# Patient Record
Sex: Male | Born: 1945
Health system: Southern US, Community
[De-identification: ages and names within clinical notes are randomized; demographics above are authoritative.]

## PROBLEM LIST (undated history)

## (undated) DIAGNOSIS — J45909 Unspecified asthma, uncomplicated: Secondary | ICD-10-CM

## (undated) DIAGNOSIS — F32A Depression, unspecified: Secondary | ICD-10-CM

## (undated) DIAGNOSIS — F329 Major depressive disorder, single episode, unspecified: Secondary | ICD-10-CM

## (undated) DIAGNOSIS — Z9981 Dependence on supplemental oxygen: Secondary | ICD-10-CM

## (undated) DIAGNOSIS — R0602 Shortness of breath: Secondary | ICD-10-CM

## (undated) DIAGNOSIS — K469 Unspecified abdominal hernia without obstruction or gangrene: Secondary | ICD-10-CM

## (undated) DIAGNOSIS — I38 Endocarditis, valve unspecified: Secondary | ICD-10-CM

## (undated) DIAGNOSIS — R0600 Dyspnea, unspecified: Secondary | ICD-10-CM

## (undated) DIAGNOSIS — R011 Cardiac murmur, unspecified: Secondary | ICD-10-CM

## (undated) DIAGNOSIS — J690 Pneumonitis due to inhalation of food and vomit: Secondary | ICD-10-CM

## (undated) DIAGNOSIS — I10 Essential (primary) hypertension: Secondary | ICD-10-CM

## (undated) DIAGNOSIS — C801 Malignant (primary) neoplasm, unspecified: Secondary | ICD-10-CM

## (undated) DIAGNOSIS — K219 Gastro-esophageal reflux disease without esophagitis: Secondary | ICD-10-CM

## (undated) DIAGNOSIS — J449 Chronic obstructive pulmonary disease, unspecified: Secondary | ICD-10-CM

## (undated) DIAGNOSIS — M1990 Unspecified osteoarthritis, unspecified site: Secondary | ICD-10-CM

## (undated) DIAGNOSIS — C61 Malignant neoplasm of prostate: Secondary | ICD-10-CM

## (undated) HISTORY — PX: PROSTATE BIOPSY: SHX241

## (undated) HISTORY — PX: HERNIA REPAIR: SHX51

---

## 2002-01-18 ENCOUNTER — Encounter: Payer: Self-pay | Admitting: Emergency Medicine

## 2002-01-18 ENCOUNTER — Emergency Department (HOSPITAL_COMMUNITY): Admission: EM | Admit: 2002-01-18 | Discharge: 2002-01-18 | Payer: Self-pay | Admitting: Emergency Medicine

## 2002-05-24 ENCOUNTER — Encounter: Payer: Self-pay | Admitting: Emergency Medicine

## 2002-05-24 ENCOUNTER — Emergency Department (HOSPITAL_COMMUNITY): Admission: EM | Admit: 2002-05-24 | Discharge: 2002-05-24 | Payer: Self-pay | Admitting: Emergency Medicine

## 2003-02-27 ENCOUNTER — Emergency Department (HOSPITAL_COMMUNITY): Admission: EM | Admit: 2003-02-27 | Discharge: 2003-02-28 | Payer: Self-pay | Admitting: *Deleted

## 2003-05-18 ENCOUNTER — Emergency Department (HOSPITAL_COMMUNITY): Admission: EM | Admit: 2003-05-18 | Discharge: 2003-05-18 | Payer: Self-pay | Admitting: *Deleted

## 2003-05-18 ENCOUNTER — Encounter: Payer: Self-pay | Admitting: *Deleted

## 2003-05-27 ENCOUNTER — Ambulatory Visit (HOSPITAL_COMMUNITY): Admission: RE | Admit: 2003-05-27 | Discharge: 2003-05-27 | Payer: Self-pay | Admitting: Internal Medicine

## 2003-05-27 ENCOUNTER — Encounter: Payer: Self-pay | Admitting: Internal Medicine

## 2003-10-12 ENCOUNTER — Emergency Department (HOSPITAL_COMMUNITY): Admission: EM | Admit: 2003-10-12 | Discharge: 2003-10-12 | Payer: Self-pay | Admitting: Emergency Medicine

## 2003-11-20 ENCOUNTER — Emergency Department (HOSPITAL_COMMUNITY): Admission: EM | Admit: 2003-11-20 | Discharge: 2003-11-20 | Payer: Self-pay | Admitting: Emergency Medicine

## 2004-04-04 ENCOUNTER — Emergency Department (HOSPITAL_COMMUNITY): Admission: EM | Admit: 2004-04-04 | Discharge: 2004-04-04 | Payer: Self-pay | Admitting: Emergency Medicine

## 2004-10-07 ENCOUNTER — Inpatient Hospital Stay (HOSPITAL_COMMUNITY): Admission: EM | Admit: 2004-10-07 | Discharge: 2004-10-10 | Payer: Self-pay | Admitting: Emergency Medicine

## 2005-10-03 ENCOUNTER — Emergency Department (HOSPITAL_COMMUNITY): Admission: EM | Admit: 2005-10-03 | Discharge: 2005-10-03 | Payer: Self-pay | Admitting: Emergency Medicine

## 2009-02-10 ENCOUNTER — Ambulatory Visit (HOSPITAL_COMMUNITY): Admission: RE | Admit: 2009-02-10 | Discharge: 2009-02-10 | Payer: Self-pay | Admitting: Urology

## 2009-05-12 ENCOUNTER — Ambulatory Visit (HOSPITAL_COMMUNITY): Admission: RE | Admit: 2009-05-12 | Discharge: 2009-05-12 | Payer: Self-pay | Admitting: Urology

## 2009-08-18 ENCOUNTER — Encounter (HOSPITAL_COMMUNITY): Admission: RE | Admit: 2009-08-18 | Discharge: 2009-09-17 | Payer: Self-pay | Admitting: Urology

## 2009-10-18 HISTORY — PX: OTHER SURGICAL HISTORY: SHX169

## 2011-01-24 LAB — CBC
HCT: 48.4 % (ref 39.0–52.0)
MCHC: 34.5 g/dL (ref 30.0–36.0)
MCV: 91.2 fL (ref 78.0–100.0)
RBC: 5.31 MIL/uL (ref 4.22–5.81)

## 2011-01-24 LAB — BASIC METABOLIC PANEL
BUN: 10 mg/dL (ref 6–23)
CO2: 28 mEq/L (ref 19–32)
Chloride: 101 mEq/L (ref 96–112)
Creatinine, Ser: 0.8 mg/dL (ref 0.4–1.5)
GFR calc Af Amer: >60 mL/min (ref >60)
Potassium: 4.3 mEq/L (ref 3.5–5.1)

## 2011-03-02 NOTE — H&P (Signed)
NAME:  Michael Ellison, Michael Ellison              ACCOUNT NO.:  [id number]   MEDICAL RECORD NO.:  [id number]          PATIENT TYPE:  AMB   LOCATION:  DAY                           FACILITY:  [facility]   PHYSICIAN:  Dennie Maizes, M.D.   DATE OF BIRTH:  [birth year]   DATE OF ADMISSION:  DATE OF DISCHARGE:  LH                              HISTORY & PHYSICAL   CHIEF COMPLAINT:  Microhematuria, urethral stricture, elevated PSA.   HISTORY OF PRESENT ILLNESS:  This 65 year old male was referred to me by  Dr. Felecia Shelling for evaluation of elevated PSA of 10.7 nanograms per mL.  He  has been treated with a course of antibiotics.  His PSA is elevated at  11.5 nanograms/mL.  He is waiting for further evaluation and transrectal  ultrasound and ultrasound-guided biopsy of the prostate.   The patient has been noted to have persistent microhematuria.  Urine  culture and sensitivity revealed no growth.  Urine cytology revealed no  evidence of malignant or dysplastic cells.  CT scan of the abdomen and  pelvis revealed bilateral renal cysts.  There is no evidence of any  renal mass, hydronephrosis, or calculus.  Cystoscopy in the office  revealed a urethral stricture.  The patient is brought to the short-stay  center today for cystoscopy and possible visual internal urethrotomy.   The patient denied having amylase voiding difficulty at present.  He has  urinary frequency times 5 to 6 and nocturia times zero.  No history of  dysuria, gross hematuria, low back pain.   PAST MEDICAL HISTORY:  Negative for urinary tract infections,  urolithiasis, or prostate problems.  History of hypertension, COPD.   MEDICATIONS:  Diovan 160/12.5 1 p.o. daily, albuterol inhaler.   ALLERGIES:  None.   PAST SURGICAL HISTORY:  None.   FAMILY HISTORY:  Negative for urolithiasis.  There is no family history  of prostate cancer.   REVIEW OF SYSTEMS:  Negative.   PHYSICAL EXAMINATION:  VITAL SIGNS:  Height 6 feet 3 inches, weight 182  pounds.  HEENT:  Normal.  LUNGS:  Clear to auscultation.  HEART:  Regular rate and rhythm.  No murmur.  ABDOMEN:  Soft.  No palpable flank mass or costovertebral angle  tenderness.  Bladder is not palpable.  GU:  Penis and testes are normal.  RECTAL:  A 45 g benign prostate.   IMPRESSION:  1. Microhematuria.  2. Elevated PSA.  3. Urethral stricture.   PLAN:  Cystoscopy and visual internal urethrotomy in short-stay center.  I have discussed with the patient and his family regarding diagnosis,  operative details,  outcome, possible risks and complications and they  agreed for the procedure to be done.      Dennie Maizes, M.D.  Electronically Signed     SK/MEDQ  D:  05/11/2009  T:  05/11/2009  Job:  [id number]   cc:   Tesfaye D. Felecia Shelling, MD  Fax: [phone]   [facility]

## 2011-03-02 NOTE — Op Note (Signed)
NAME:  Michael Ellison, Michael Ellison              ACCOUNT NO.:  [id number]   MEDICAL RECORD NO.:  [id number]          PATIENT TYPE:  AMB   LOCATION:  DAY                           FACILITY:  [facility]   PHYSICIAN:  Dennie Maizes, M.D.   DATE OF BIRTH:  1946/04/06   DATE OF PROCEDURE:  05/12/2009  DATE OF DISCHARGE:                               OPERATIVE REPORT   PREOPERATIVE DIAGNOSES:  Voiding difficulty, microhematuria, urethral  stricture.   POSTOPERATIVE DIAGNOSES:  Voiding difficulty, microhematuria, urethral  stricture.   OPERATIVE PROCEDURE:  Cystoscopy under visual internal urethrotomy.   ANESTHESIA:  General.   SURGEON:  Dennie Maizes, MD   COMPLICATIONS:  None.   INDICATIONS FOR THE PROCEDURE:  This 65 year old male was evaluated in  the office for microhematuria.  X-rays were negative.  Cystoscopy  revealed a bulbous urethral stricture.  The patient was brought to the  operating room today for cystoscopy and visual internal urethrotomy.   DESCRIPTION OF PROCEDURE:  General anesthesia was induced, and the  patient was placed on the OR table in the dorsal lithotomy position.  The lower abdomen and genitalia were prepped and draped in a sterile  fashion.  Cystoscopy was done with a 17-French scope.  There was a  bulbous urethral stricture, and the scope could not be introduced into  the bladder.  The cystoscope was removed.   A 20-French visual urethrotome was then inserted into the urethra.  Bulbous urethral stricture was incised at 12 o'clock position with a  cold knife and opened up.  The stricture was about 2 cm in length.  There was no active bleeding at this time.  The bladder was then  examined and found to be normal.  There was no evidence of any foreign  body, tumor, or carcinoma in situ inside the bladder.  There was  moderate prostate hypertrophy with partial obstruction of the bladder  neck area.  The urethral stricture was re-examined while withdrawing the  endoscope.   The  stricture was found to be adequately opened up.  An 18-French Foley  catheter was inserted into the bladder and connected to a bag.  The  patient remained stable throughout the procedure.  He was transferred to  the PACU in a satisfactory condition.      Dennie Maizes, M.D.  Electronically Signed     SK/MEDQ  D:  05/12/2009  T:  05/12/2009  Job:  [id number]   cc:   Tesfaye D. Felecia Shelling, MD  Fax: [phone]

## 2011-03-05 NOTE — Consult Note (Signed)
NAME:  Michael Ellison, Michael Ellison              ACCOUNT NO.:  [id number]   MEDICAL RECORD NO.:  [id number]          PATIENT TYPE:  INP   LOCATION:  A311                          FACILITY:  [facility]   PHYSICIAN:  Edward L. Juanetta Gosling, M.D.DATE OF BIRTH:  July 01, 1946   DATE OF CONSULTATION:  DATE OF DISCHARGE:                                   CONSULTATION   HISTORY OF PRESENT ILLNESS:  Michael Ellison is a 65 year old who has chronic  obstructive pulmonary disease and consultation is for chronic obstructive  pulmonary disease.  He has been having shortness of breath for about two  weeks.  It has gotten much worse in the last three days.  He has been  coughing, not able to cough much of anything up.  He has become markedly  short of breath.  He came to the emergency room.  He was hypoxic.  He was  given steroids and nebulizer treatments, but he continued to have shortness  of breath.  He is admitted to the hospital for that.   PAST MEDICAL HISTORY:  His past medical history is positive for chronic  obstructive pulmonary disease, hypertension. He is out of the hospital.   MEDICATIONS:  He was on Diovan HCT 80/12.5, one daily, albuterol and  Atrovent nebulizer treatments.   SOCIAL HISTORY:  He has about a 60 pack-a-year smoking history and continues  to smoke.  He does not use any alcohol.   FAMILY HISTORY:  Positive for lung disease he says in two or three other  members.   PHYSICAL EXAMINATION:  GENERAL:  He appears to be fairly comfortable.  VITAL SIGNS:  Temperature 97.5, pulse 106, respirations 20.  Blood sugar was  220.  He has not been known to be a diabetic previously.  Blood pressure  144/103, O2 saturations 95%, now on two liters.  CHEST:  Fairly marked bilateral rhonchi and wheezes.  He has a marked cough.  EXTREMITIES:  Showed no edema.  CNS:  Grossly intact.  CARDIAC:  Exam is largely obscured by airway noise.   LABORATORY DATA:  Blood gas shows a pO2 of 56, pCO2 of 36, pH 7.45.  White  count 22,600, hemoglobin 16.1.  He had 95% neutrophils.  Blood sugar was 157  on admission.  Potassium 3.1.  Chest x-ray showed chronic obstructive  pulmonary disease, no pneumonia.   ASSESSMENT:  He has chronic obstructive pulmonary disease exacerbation.  He  may be diabetic now. He has been hypokalemic which is being replaced.  At  this point, I agree with his current treatments which are albuterol and  Atrovent nebulizer treatments, Solu-Medrol, Avapro which is substitute for  his Diovan, Rocephin and Ativan on a p.r.n. basis.  I would go ahead and add  Advair and continue all of the other treatments and follow.   Thank you for allowing me to see him with you.     Edwa   ELH/MEDQ  D:  10/08/2004  T:  10/08/2004  Job:  [id number]

## 2011-03-05 NOTE — H&P (Signed)
NAME:  Michael Ellison, Michael Ellison              ACCOUNT NO.:  [id number]   MEDICAL RECORD NO.:  [id number]          PATIENT TYPE:  INP   LOCATION:  A311                          FACILITY:  [facility]   PHYSICIAN:  Tesfaye D. Felecia Shelling, MD   DATE OF BIRTH:  1946/08/19   DATE OF ADMISSION:  10/07/2004  DATE OF DISCHARGE:  LH                                HISTORY & PHYSICAL   CHIEF COMPLAINT:  Cough and shortness of breath.   HISTORY OF PRESENT ILLNESS:  This is a 65 year old male patient with history  of hypertension and chronic obstructive pulmonary disease who came to the  emergency room with above complaint.  The patient had symptoms of shortness  of breath and a cough for the last two weeks.  He was using his nebulizer  treatment.  This morning the patient started having severe shortness of  breath  and coughing.  He could not breathe comfortably.  He was not able to  take anything orally.  He was brought to the emergency room where he was  evaluated.  The patient was found to be significantly hypoxic.  He was given  several nebulizer treatments and oral steroids.  However, his symptoms  continued to persist.  The patient was then admitted and started on IV  steroids and nebulizer treatment.   REVIEW OF SYSTEMS:  The patient has generalized weakness.  He has difficulty  in taking oral feeding.  The patient feels weak and tired.  No history of  fever, chills, chest pain, nausea, vomiting, abdominal  pain, dysuria, or  frequency or urgency of urination.   PAST MEDICAL HISTORY:  1.  Chronic obstructive pulmonary disease.  2.  Hypertension.  3.  Nicotine addiction.   CURRENT MEDICATIONS:  1.  Darvon HCT 80/0.5 one tablet p.o. daily.  2.  Atrovent and Proventil nebulizer q.4h.   SOCIAL HISTORY:  The patient is currently disabled due to his illness.  The  patient has history of longstanding tobacco abuse.  No history of alcohol or  substance abuse.   PHYSICAL EXAMINATION:  GENERAL:  The patient is  alert, awake, acutely sick  looking.  VITAL SIGNS:  Blood pressure 129/78, pulse 132, respiratory rate 28,  temperature 97 degrees F.  HEENT:  Pupils equal and reactive.  NECK:  Supple.  CHEST:  Poor air entry, bilateral expiratory wheezes and rhonchi.  CARDIOVASCULAR;  First and second heart sounds heard.  Tachycardic but  regular.  ABDOMEN:  Soft.  Bowel sounds positive.  No organomegaly.  EXTREMITIES:  No leg edema.   LABORATORY DATA AND OTHER STUDIES:  ABG on room air, pH 7.4, PCO2 28.1, PO2  50.0, bicarb 17, saturation 90.7.  CBC:  WBC 22.6, hemoglobin 16.1,  hematocrit 47.1, and platelets 304.  Sodium 133, potassium 3.1, chloride  100, carbon dioxide 22, glucose 157, BUN 9, creatinine 0.9, and calcium 9.2.   IMPRESSION:  1.  Acute exacerbation of chronic obstructive pulmonary disease with      hypoxemia.  2.  Tachycardia probably secondary to above.  3.  Mild hypokalemia.  4.  Hypertension.  5.  Nicotine addiction.   PLAN:  1.  Will continue patient on Atrovent and Xopenex nebulizer.  2.  Will continue IV antibiotics.  3.  Continue IV steroids.  4.  Will supplement his potassium.  5.  Will do pulmonary consult.  6.  Will repeat ABGs.  7.  Will start patient on IV fluids.     Tesf   TDF/MEDQ  D:  10/07/2004  T:  10/07/2004  Job:  [id number]

## 2011-03-05 NOTE — Discharge Summary (Signed)
NAME:  Michael Ellison, Michael Ellison              ACCOUNT NO.:  [id number]   MEDICAL RECORD NO.:  [id number]          PATIENT TYPE:  INP   LOCATION:  A311                          FACILITY:  [facility]   PHYSICIAN:  Tesfaye D. Felecia Shelling, MD   DATE OF BIRTH:  Nov 12, 1945   DATE OF ADMISSION:  DATE OF DISCHARGE:  12/24/2005LH                                 DISCHARGE SUMMARY   DISCHARGE DIAGNOSES:  1.  Acute exacerbation of chronic obstructive pulmonary disease.  2.  Hypoxemia secondary to the above.  3.  Hypertension.   DISCHARGE MEDICATIONS:  1.  Atrovent and Proventil nebulizer, q.4h. while awake.  2.  Prednisolone, 40 mg with tapering dose.  3.  Cefzil 500 mg p.o. b.i.d. for five more days.  4.  Advair 250/ 500, one puff b.i.d.  5.  Diovan HCT 80/12.5, one tablet p.o. daily.   DISPOSITION:  The patient will be discharged home in stable condition.   HOSPITAL COURSE:  This is a 65 year old male patient with a known case of  hypertension and acute obstructive pulmonary disease who came due to cough  and shortness of breath.  The patient was found to be in acute exacerbation  of chronic obstructive pulmonary disease.  He was admitted and started on IV  steroid, IV antibiotics and nebulizer treatment.  The patient also given  oxygen.  Over the hospital stay, he gradually improved.  A pulmonary consult  was also done and assisted in his management.  The patient currently is  improved.  He will be discharged to home on oral steroid and nebulizer  treatments.     Tesf   TDF/MEDQ  D:  10/10/2004  T:  10/10/2004  Job:  [id number]

## 2011-03-29 ENCOUNTER — Emergency Department (HOSPITAL_COMMUNITY)
Admission: EM | Admit: 2011-03-29 | Discharge: 2011-03-29 | Disposition: A | Discharge status: Home / Self Care | Payer: Medicare Other | Attending: Emergency Medicine | Admitting: Emergency Medicine

## 2011-03-29 ENCOUNTER — Emergency Department (HOSPITAL_COMMUNITY): Payer: Medicare Other

## 2011-03-29 DIAGNOSIS — Z8546 Personal history of malignant neoplasm of prostate: Secondary | ICD-10-CM | POA: Insufficient documentation

## 2011-03-29 DIAGNOSIS — I10 Essential (primary) hypertension: Secondary | ICD-10-CM | POA: Insufficient documentation

## 2011-03-29 DIAGNOSIS — W2203XA Walked into furniture, initial encounter: Secondary | ICD-10-CM | POA: Insufficient documentation

## 2011-03-29 DIAGNOSIS — S92309A Fracture of unspecified metatarsal bone(s), unspecified foot, initial encounter for closed fracture: Secondary | ICD-10-CM | POA: Insufficient documentation

## 2011-03-29 DIAGNOSIS — H9319 Tinnitus, unspecified ear: Secondary | ICD-10-CM | POA: Insufficient documentation

## 2011-03-29 DIAGNOSIS — F17200 Nicotine dependence, unspecified, uncomplicated: Secondary | ICD-10-CM | POA: Insufficient documentation

## 2011-10-19 HISTORY — PX: ZENKER'S DIVERTICULECTOMY: SHX6190

## 2012-02-24 ENCOUNTER — Inpatient Hospital Stay (HOSPITAL_COMMUNITY)
Admission: EM | Admit: 2012-02-24 | Discharge: 2012-04-25 | DRG: 003 | Disposition: A | Discharge status: Skilled Nursing Facility | Payer: Medicare Other | Source: Ambulatory Visit | Attending: Pulmonary Disease | Admitting: Pulmonary Disease

## 2012-02-24 ENCOUNTER — Emergency Department (HOSPITAL_COMMUNITY): Payer: Medicare Other

## 2012-02-24 ENCOUNTER — Encounter (HOSPITAL_COMMUNITY): Payer: Self-pay | Admitting: Emergency Medicine

## 2012-02-24 DIAGNOSIS — K560 Paralytic ileus: Secondary | ICD-10-CM | POA: Diagnosis not present

## 2012-02-24 DIAGNOSIS — R1310 Dysphagia, unspecified: Secondary | ICD-10-CM | POA: Diagnosis not present

## 2012-02-24 DIAGNOSIS — R6520 Severe sepsis without septic shock: Secondary | ICD-10-CM | POA: Diagnosis not present

## 2012-02-24 DIAGNOSIS — J80 Acute respiratory distress syndrome: Secondary | ICD-10-CM

## 2012-02-24 DIAGNOSIS — R7309 Other abnormal glucose: Secondary | ICD-10-CM | POA: Diagnosis not present

## 2012-02-24 DIAGNOSIS — Y849 Medical procedure, unspecified as the cause of abnormal reaction of the patient, or of later complication, without mention of misadventure at the time of the procedure: Secondary | ICD-10-CM | POA: Diagnosis present

## 2012-02-24 DIAGNOSIS — J9600 Acute respiratory failure, unspecified whether with hypoxia or hypercapnia: Secondary | ICD-10-CM | POA: Diagnosis present

## 2012-02-24 DIAGNOSIS — D649 Anemia, unspecified: Secondary | ICD-10-CM | POA: Diagnosis not present

## 2012-02-24 DIAGNOSIS — R5381 Other malaise: Secondary | ICD-10-CM | POA: Diagnosis not present

## 2012-02-24 DIAGNOSIS — Z930 Tracheostomy status: Secondary | ICD-10-CM

## 2012-02-24 DIAGNOSIS — F0390 Unspecified dementia without behavioral disturbance: Secondary | ICD-10-CM | POA: Diagnosis present

## 2012-02-24 DIAGNOSIS — R404 Transient alteration of awareness: Secondary | ICD-10-CM | POA: Diagnosis present

## 2012-02-24 DIAGNOSIS — R109 Unspecified abdominal pain: Secondary | ICD-10-CM | POA: Diagnosis present

## 2012-02-24 DIAGNOSIS — K9189 Other postprocedural complications and disorders of digestive system: Secondary | ICD-10-CM | POA: Diagnosis present

## 2012-02-24 DIAGNOSIS — I498 Other specified cardiac arrhythmias: Secondary | ICD-10-CM | POA: Diagnosis present

## 2012-02-24 DIAGNOSIS — G9340 Encephalopathy, unspecified: Secondary | ICD-10-CM

## 2012-02-24 DIAGNOSIS — R578 Other shock: Secondary | ICD-10-CM | POA: Diagnosis not present

## 2012-02-24 DIAGNOSIS — K56609 Unspecified intestinal obstruction, unspecified as to partial versus complete obstruction: Secondary | ICD-10-CM

## 2012-02-24 DIAGNOSIS — E870 Hyperosmolality and hypernatremia: Secondary | ICD-10-CM | POA: Diagnosis not present

## 2012-02-24 DIAGNOSIS — R6521 Severe sepsis with septic shock: Secondary | ICD-10-CM

## 2012-02-24 DIAGNOSIS — F1010 Alcohol abuse, uncomplicated: Secondary | ICD-10-CM

## 2012-02-24 DIAGNOSIS — E876 Hypokalemia: Secondary | ICD-10-CM | POA: Diagnosis not present

## 2012-02-24 DIAGNOSIS — A419 Sepsis, unspecified organism: Secondary | ICD-10-CM | POA: Diagnosis not present

## 2012-02-24 DIAGNOSIS — T424X5A Adverse effect of benzodiazepines, initial encounter: Secondary | ICD-10-CM | POA: Diagnosis not present

## 2012-02-24 DIAGNOSIS — T8130XA Disruption of wound, unspecified, initial encounter: Secondary | ICD-10-CM | POA: Diagnosis not present

## 2012-02-24 DIAGNOSIS — N179 Acute kidney failure, unspecified: Secondary | ICD-10-CM | POA: Diagnosis present

## 2012-02-24 DIAGNOSIS — N390 Urinary tract infection, site not specified: Secondary | ICD-10-CM | POA: Diagnosis not present

## 2012-02-24 DIAGNOSIS — I10 Essential (primary) hypertension: Secondary | ICD-10-CM | POA: Diagnosis present

## 2012-02-24 DIAGNOSIS — K225 Diverticulum of esophagus, acquired: Secondary | ICD-10-CM | POA: Diagnosis present

## 2012-02-24 DIAGNOSIS — J441 Chronic obstructive pulmonary disease with (acute) exacerbation: Secondary | ICD-10-CM | POA: Diagnosis present

## 2012-02-24 DIAGNOSIS — R451 Restlessness and agitation: Secondary | ICD-10-CM

## 2012-02-24 DIAGNOSIS — Z8546 Personal history of malignant neoplasm of prostate: Secondary | ICD-10-CM

## 2012-02-24 DIAGNOSIS — E46 Unspecified protein-calorie malnutrition: Secondary | ICD-10-CM | POA: Diagnosis not present

## 2012-02-24 DIAGNOSIS — K565 Intestinal adhesions [bands], unspecified as to partial versus complete obstruction: Principal | ICD-10-CM | POA: Diagnosis present

## 2012-02-24 DIAGNOSIS — J954 Chemical pneumonitis due to anesthesia: Secondary | ICD-10-CM | POA: Diagnosis not present

## 2012-02-24 DIAGNOSIS — F341 Dysthymic disorder: Secondary | ICD-10-CM | POA: Diagnosis present

## 2012-02-24 DIAGNOSIS — R197 Diarrhea, unspecified: Secondary | ICD-10-CM | POA: Diagnosis not present

## 2012-02-24 DIAGNOSIS — J690 Pneumonitis due to inhalation of food and vomit: Secondary | ICD-10-CM

## 2012-02-24 DIAGNOSIS — R188 Other ascites: Secondary | ICD-10-CM | POA: Diagnosis not present

## 2012-02-24 DIAGNOSIS — Y9210 Unspecified residential institution as the place of occurrence of the external cause: Secondary | ICD-10-CM | POA: Clinically undetermined

## 2012-02-24 DIAGNOSIS — G9341 Metabolic encephalopathy: Secondary | ICD-10-CM | POA: Diagnosis not present

## 2012-02-24 DIAGNOSIS — R000 Tachycardia, unspecified: Secondary | ICD-10-CM

## 2012-02-24 DIAGNOSIS — J95821 Acute postprocedural respiratory failure: Secondary | ICD-10-CM

## 2012-02-24 DIAGNOSIS — K567 Ileus, unspecified: Secondary | ICD-10-CM

## 2012-02-24 HISTORY — DX: Essential (primary) hypertension: I10

## 2012-02-24 HISTORY — DX: Malignant neoplasm of prostate: C61

## 2012-02-24 HISTORY — DX: Malignant (primary) neoplasm, unspecified: C80.1

## 2012-02-24 LAB — COMPREHENSIVE METABOLIC PANEL
ALT: 22 U/L (ref 0–53)
AST: 24 U/L (ref 0–37)
Albumin: 4.3 g/dL (ref 3.5–5.2)
CO2: 28 mEq/L (ref 19–32)
Calcium: 10.7 mg/dL — ABNORMAL HIGH (ref 8.4–10.5)
Creatinine, Ser: 0.85 mg/dL (ref 0.50–1.35)
Sodium: 139 mEq/L (ref 135–145)
Total Protein: 8.7 g/dL — ABNORMAL HIGH (ref 6.0–8.3)

## 2012-02-24 LAB — DIFFERENTIAL
Basophils Absolute: 0 10*3/uL (ref 0.0–0.1)
Basophils Relative: 0 % (ref 0–1)
Eosinophils Absolute: 0 10*3/uL (ref 0.0–0.7)
Eosinophils Relative: 0 % (ref 0–5)
Lymphocytes Relative: 4 % — ABNORMAL LOW (ref 12–46)
Monocytes Absolute: 0.7 10*3/uL (ref 0.1–1.0)

## 2012-02-24 LAB — URINALYSIS, ROUTINE W REFLEX MICROSCOPIC
Glucose, UA: NEGATIVE mg/dL
Leukocytes, UA: NEGATIVE
Protein, ur: 100 mg/dL — AB
Specific Gravity, Urine: 1.025 (ref 1.005–1.030)

## 2012-02-24 LAB — CBC
HCT: 50.6 % (ref 39.0–52.0)
MCHC: 34.8 g/dL (ref 30.0–36.0)
MCV: 89.4 fL (ref 78.0–100.0)
Platelets: 258 10*3/uL (ref 150–400)
RDW: 14.2 % (ref 11.5–15.5)
WBC: 20.1 10*3/uL — ABNORMAL HIGH (ref 4.0–10.5)

## 2012-02-24 LAB — URINE MICROSCOPIC-ADD ON

## 2012-02-24 MED ORDER — IOHEXOL 300 MG/ML  SOLN
100.0000 mL | Freq: Once | INTRAMUSCULAR | Status: AC | PRN
  Administered 2012-02-24: 100 mL via INTRAVENOUS

## 2012-02-24 MED ORDER — ONDANSETRON HCL 4 MG/2ML IJ SOLN
4.0000 mg | Freq: Four times a day (QID) | INTRAMUSCULAR | Status: DC | PRN
  Administered 2012-02-24 – 2012-02-29 (×5): 4 mg via INTRAVENOUS
  Filled 2012-02-24 (×5): qty 2

## 2012-02-24 MED ORDER — HYDROMORPHONE HCL PF 1 MG/ML IJ SOLN: 1.0000 mg | INTRAMUSCULAR | Status: DC | PRN

## 2012-02-24 MED ORDER — SODIUM CHLORIDE 0.9 % IV SOLN
Freq: Once | INTRAVENOUS | Status: AC
  Administered 2012-02-24: 09:00:00 via INTRAVENOUS

## 2012-02-24 MED ORDER — IOHEXOL 300 MG/ML  SOLN: 40.0000 mL | Freq: Once | INTRAMUSCULAR | Status: AC | PRN

## 2012-02-24 MED ORDER — HYDROMORPHONE HCL PF 1 MG/ML IJ SOLN
1.0000 mg | INTRAMUSCULAR | Status: DC | PRN
  Administered 2012-02-24: 1 mg via INTRAVENOUS
  Administered 2012-02-24 – 2012-02-25 (×4): 2 mg via INTRAVENOUS
  Filled 2012-02-24 (×3): qty 2
  Filled 2012-02-24: qty 1
  Filled 2012-02-24: qty 2

## 2012-02-24 MED ORDER — ONDANSETRON HCL 4 MG/2ML IJ SOLN: 4.0000 mg | Freq: Three times a day (TID) | INTRAMUSCULAR | Status: DC | PRN

## 2012-02-24 MED ORDER — ENOXAPARIN SODIUM 40 MG/0.4ML ~~LOC~~ SOLN
40.0000 mg | SUBCUTANEOUS | Status: DC
  Administered 2012-02-24: 40 mg via SUBCUTANEOUS
  Filled 2012-02-24: qty 0.4

## 2012-02-24 MED ORDER — LACTATED RINGERS IV SOLN
INTRAVENOUS | Status: DC
  Administered 2012-02-24 – 2012-02-25 (×2): via INTRAVENOUS

## 2012-02-24 MED ORDER — PANTOPRAZOLE SODIUM 40 MG IV SOLR
40.0000 mg | Freq: Every day | INTRAVENOUS | Status: DC
  Administered 2012-02-24 – 2012-03-24 (×30): 40 mg via INTRAVENOUS
  Filled 2012-02-24 (×34): qty 40

## 2012-02-24 MED ORDER — HYDROMORPHONE HCL PF 2 MG/ML IJ SOLN
2.0000 mg | Freq: Once | INTRAMUSCULAR | Status: AC
  Administered 2012-02-24: 2 mg via INTRAVENOUS
  Filled 2012-02-24: qty 1

## 2012-02-24 MED ORDER — ONDANSETRON HCL 4 MG/2ML IJ SOLN
4.0000 mg | Freq: Once | INTRAMUSCULAR | Status: AC
  Administered 2012-02-24: 4 mg via INTRAVENOUS
  Filled 2012-02-24: qty 2

## 2012-02-24 MED ORDER — PROMETHAZINE HCL 25 MG/ML IJ SOLN
25.0000 mg | Freq: Once | INTRAMUSCULAR | Status: AC
  Administered 2012-02-24: 25 mg via INTRAVENOUS
  Filled 2012-02-24: qty 1

## 2012-02-24 MED ORDER — SODIUM CHLORIDE 0.9 % IV SOLN: INTRAVENOUS | Status: DC

## 2012-02-24 NOTE — ED Provider Notes (Signed)
History   This chart was scribed for Carleene Cooper III, MD by Clarita Crane. The patient was seen in room APA06/APA06. Patient's care was started at 0828.    CSN: [id number]  Arrival date & time 02/24/12  2130   First MD Initiated Contact with Patient 02/24/12 [phone]      Chief Complaint  Patient presents with  . Abdominal Pain  . Emesis    (Consider location/radiation/quality/duration/timing/severity/associated sxs/prior treatment) HPI Michael Ellison is a 66 y.o. male who presents to the Emergency Department complaining of moderate to severe periumbilical abdominal pain rated a 10 out 10 currently with associated nausea, vomiting, tinnitus and diaphoresis onset last night after eating a meal consisting of a hot dog and french fries and persistent since. Also notes experiencing chest tightness after onset of abdominal pain but states tightness was relieved with use of at home breathing treatment.  Patient reports experiencing similar symptoms approximately 1 month ago. Denies diarrhea, fever, chills, SOB, congestion, rash. Patient with h/o HTN, CA, hernia repair, prostatectomy and is a current smoker (1 pack/day).  Past Medical History  Diagnosis Date  . Hypertension   . Cancer     Past Surgical History  Procedure Date  . Hernia repair   . Prostatectomy     No family history on file.  History  Substance Use Topics  . Smoking status: Current Everyday Smoker  . Smokeless tobacco: Not on file  . Alcohol Use: Yes     occasional      Review of Systems A complete 10 system review of systems was obtained and all systems are negative except as noted in the HPI and PMH.   Allergies  Review of patient's allergies indicates no known allergies.  Home Medications  No current outpatient prescriptions on file.  BP 148/98  Pulse 100  Temp 97.7 F (36.5 C)  Resp 20  Ht 6' 2.5" (1.892 m)  Wt 184 lb (83.462 kg)  BMI 23.31 kg/m2  SpO2 98%  Physical Exam  Nursing note and  vitals reviewed. Constitutional: He is oriented to person, place, and time. He appears well-developed and well-nourished. No distress.  HENT:  Head: Normocephalic and atraumatic.  Mouth/Throat: Oropharynx is clear and moist.  Eyes: EOM are normal. Pupils are equal, round, and reactive to light.  Neck: Neck supple. No tracheal deviation present.  Cardiovascular: Normal rate, regular rhythm and normal heart sounds.  Exam reveals no gallop and no friction rub.   No murmur heard. Pulmonary/Chest: Effort normal and breath sounds normal. No respiratory distress. He has no wheezes. He has no rales.  Abdominal: Soft. He exhibits distension. He exhibits no mass. Bowel sounds are decreased. There is tenderness (moderate) in the periumbilical area.       Decreased bowel sounds.  Musculoskeletal: Normal range of motion. He exhibits no edema.  Neurological: He is alert and oriented to person, place, and time. No sensory deficit.  Skin: Skin is warm and dry.  Psychiatric: He has a normal mood and affect. His behavior is normal.    ED Course  Procedures (including critical care time)  DIAGNOSTIC STUDIES: Oxygen Saturation is 98% on room air, normal by my interpretation.    COORDINATION OF CARE: 8:50AM-Patient informed of current plan for treatment and evaluation and agrees with plan at this time.  9:36 AM-Pt vomiting his oral contrast.  Rx Phenergan 25 mg IV. 10:48 AM- Review of lab tests shows CBC with WBC elevated at 20,100 with 93% neutrophils.  Urinalysis is negative.  Glucose is elevated at 184.  Renal function and liver function tests are normal.  Waiting for results of abdominal/pelvic CT. 11:38 AM-CT of abdomen/pelvis shows a small bowel obstruction in the right lower abdomen, felt to be due to adhesions.  Will call Dr. Leticia Penna, general surgeon on call, to see and admit pt.  11:47AM- Carleene Cooper III, MD to patient bedside to explain current lab and imaging results to patient. Patient informed  of intent to admit for further treatment and evaluation.  11:52AM- Consult complete with Dr. Leticia Penna. Patient case explained and discussed.   Results for orders placed during the hospital encounter of 02/24/12  CBC      Component Value Range   WBC 20.1 (*) 4.0 - 10.5 (K/uL)   RBC 5.66  4.22 - 5.81 (MIL/uL)   Hemoglobin 17.6 (*) 13.0 - 17.0 (g/dL)   HCT 16.1  09.6 - 04.5 (%)   MCV 89.4  78.0 - 100.0 (fL)   MCH 31.1  26.0 - 34.0 (pg)   MCHC 34.8  30.0 - 36.0 (g/dL)   RDW 40.9  81.1 - 91.4 (%)   Platelets 258  150 - 400 (K/uL)  DIFFERENTIAL      Component Value Range   Neutrophils Relative 93 (*) 43 - 77 (%)   Neutro Abs 18.7 (*) 1.7 - 7.7 (K/uL)   Lymphocytes Relative 4 (*) 12 - 46 (%)   Lymphs Abs 0.7  0.7 - 4.0 (K/uL)   Monocytes Relative 3  3 - 12 (%)   Monocytes Absolute 0.7  0.1 - 1.0 (K/uL)   Eosinophils Relative 0  0 - 5 (%)   Eosinophils Absolute 0.0  0.0 - 0.7 (K/uL)   Basophils Relative 0  0 - 1 (%)   Basophils Absolute 0.0  0.0 - 0.1 (K/uL)  COMPREHENSIVE METABOLIC PANEL      Component Value Range   Sodium 139  135 - 145 (mEq/L)   Potassium 3.9  3.5 - 5.1 (mEq/L)   Chloride 100  96 - 112 (mEq/L)   CO2 28  19 - 32 (mEq/L)   Glucose, Bld 184 (*) 70 - 99 (mg/dL)   BUN 12  6 - 23 (mg/dL)   Creatinine, Ser 7.82  0.50 - 1.35 (mg/dL)   Calcium 95.6 (*) 8.4 - 10.5 (mg/dL)   Total Protein 8.7 (*) 6.0 - 8.3 (g/dL)   Albumin 4.3  3.5 - 5.2 (g/dL)   AST 24  0 - 37 (U/L)   ALT 22  0 - 53 (U/L)   Alkaline Phosphatase 64  39 - 117 (U/L)   Total Bilirubin 0.9  0.3 - 1.2 (mg/dL)   GFR calc non Af Amer >90  >90 (mL/min)   GFR calc Af Amer >90  >90 (mL/min)  LIPASE, BLOOD      Component Value Range   Lipase 18  11 - 59 (U/L)   Ct Abdomen Pelvis W Contrast  02/24/2012  *RADIOLOGY REPORT*  Clinical Data: Abdominal pain, nausea and vomiting.  History of prostate cancer.  CT ABDOMEN AND PELVIS WITH CONTRAST  Technique:  Multidetector CT imaging of the abdomen and pelvis was  performed following the standard protocol during bolus administration of intravenous contrast.  Contrast: OMNIPAQUE IOHEXOL 300 MG/ML  SOLN CT  Comparison: CT abdomen and pelvis 02/10/2009.  Findings: Bibasilar atelectatic change is noted.  No pleural or pericardial effusion.  Heart size is upper normal.  The patient has a small bowel obstruction.  Small bowel loops are dilated  up to 4.1 cm.  No pneumatosis, free intraperitoneal air or portal venous gas is identified.  Transition point appears to be in the right lower quadrant.  The patient does have a small right inguinal hernia containing a knuckle of small bowel but this does not appear to be the transition point.  A single small low attenuation lesion in the right hepatic lobe is unchanged.  The liver is otherwise unremarkable.  The gallbladder, adrenal glands, pancreas and spleen appear normal.  There is colonic diverticulosis without diverticulitis. Fat containing umbilical hernia is noted.  No focal bony abnormality.  IMPRESSION:  1.  Study is positive for small bowel obstruction.  Obstruction appears to be due to adhesions in the right lower quadrant.  There is a small right inguinal hernia containing a knuckle of small bowel but this does not appear to be the cause of patient's obstruction. 2.  No change in bilateral renal cysts and a single small hepatic cyst. 3.  Diverticulosis without diverticulitis.  Original Report Authenticated By: Bernadene Bell. D'ALESSIO, M.D.     1. Small bowel obstruction       I personally performed the services described in this documentation, which was scribed in my presence. The recorded information has been reviewed and considered.  Osvaldo Human, M.D.   Carleene Cooper III, MD 02/24/12 1210

## 2012-02-24 NOTE — ED Notes (Signed)
Pt c/o severe abd pain with n/v since last night. Pt stomach is distended with hyperactive bowel sounds.

## 2012-02-24 NOTE — Care Management Note (Unsigned)
Page 1 of 1   03/24/2012     1:58:27 PM   CARE MANAGEMENT NOTE 03/24/2012  Patient:  Michael Ellison, Michael Ellison   Account Number:  [id number]  Date Initiated:  02/24/2012  Documentation initiated by:  [facility]  Subjective/Objective Assessment:   66 yo in through the ED with abd pain/elevated WBCs> sbo on imaging. Surgical consult.     Action/Plan:   Will monitor for any discharge needs.   Anticipated DC Date:  03/17/2012   Anticipated DC Plan:  LONG TERM ACUTE CARE (LTAC)      DC Planning Services  CM consult      Choice offered to / List presented to:             Status of service:  In process, will continue to follow Medicare Important Message given?   (If response is "NO", the following Medicare IM given date fields will be blank) Date Medicare IM given:   Date Additional Medicare IM given:    Discharge Disposition:    Per UR Regulation:  Reviewed for med. necessity/level of care/duration of stay  If discussed at Long Length of Stay Meetings, dates discussed:   03/15/2012  03/22/2012    Comments:  03/23/12 Brooklen Runquist,RN,BSN 1200 PT BACK ON VENT NOW; CONTINUES TO FAIL WEANING ATTEMPTS. NOTIFIED BY [facility] THAT INSURANCE PAYOR REEVALUATED FOR LTAC, AND PT HAS NO OUT OF NETWORK BENEFITS-THEY ARE NOT WILLING TO NEGOTIATE PAYMENT ARRANGEMENTS. PT WILL NEED VENT SNF IF UNABLE TO WEAN.  WILL CONSULT CSW.  03/17/12 1030 Henrietta Mayo RN MSN CCM TC from [place] who states that she and sisters will provide 24/7 assistance, rehab adm coordinator notified.  [place] relays concern about pt's depression - MD informed.  03/16/12 1130 Henrietta Mayo RN MSN CCM Failed swallow eval, PANDA for TFs.  Trach to be downsized to #6. 1600 Inpt Rehab admissions coordinator feels pt is a good rehab candidate if family can provide 24/7 after rehab.  TC to Gabriel Rung - she will talk to other family members to determine level of assistance available to pt.  03/15/12 1500 Henrietta Mayo RN MSN CCM Pt  remains on TNA, now on 28% trach collar.  Pending swallow eval.  03/14/12 0930 Henrietta Mayo RN MSN CCM Pt x-ferred to SDU.  TC to on-site insurance reviewer, pt has no LTAC benefit.  Provided information to PT who now recommends SNF.   Talked with mother (who is in a w/c) who states pt will be staying with her when d/c'd, and that he will need to be able to care for himself as she will be unable to provide any physical assistance due to her condition. 1700 TC from [employer] [phone]).  Discussed need for transfer to SNF.  She is in agreement as she works and will be unable to provide 24/7 assistance to pt.  03-10-12 1:45pm Avie Arenas, RNBSN [phone] Talked with daughter, Lindaann Pascal and Patsy Lager in room along with patients mother, who is in a w/c.  Updated on possible progression to LTach with goal of returning home. Agreeable to Ltach progression - understand awaiting insurance approval at this point.  Contact next week is [place] - best phone to contact is [phone].  03-10-12 12:07pm Avie Arenas, RNBSN [phone] PT/OT recommending Ltach - referral placed. Talked with Mr. Space - agreeable with Ltach referral.  Explained we would be awaiting insurance appoval post.  Would like me to call Barbaraann Rondo.  Contacted Monique [phone]. She will be  her this afternoon.  Sister Lindaann Pascal will be her shortly.  03-10-12 10:40am Avie Arenas, RNBSN [phone] s/p trach on 03-08-12 - still with VAC and TNA and retention sutures.  Asked for PT/OT consult with possible inpt rehab due to deconditioning when able.  02/28/12 1410 Arlyss Queen, RN BSN CM Pt now in DT's and on [employer] gtt. Spoke with pts daughter Chalmers Iddings, who lives out of town, and she stated that pt lived alone prior to hospitalization. She also stated that she has another sister who lives out of town and a sister who lives in town but works. They would like pt to return home at discharge with [facility]. Pt mother lives up the  road from pt and daughter stated that pt could go live with his mother for a short while for recovery. Will continue to monitor for [facility] needs. May need PT consult once pt is more cooperative from DT's. 02/25/12 1055 Arlyss Queen, RN BSN CM Pt admitted with SBO and is undergoing surgery on 02/25/12. Will continue to follow for any discharge planning needs.

## 2012-02-24 NOTE — ED Notes (Signed)
Patient transported to CT 

## 2012-02-24 NOTE — ED Notes (Signed)
Pt c/o n/v/abd pain since last night. Denies diarrhea/constipation. lnbm-yesterday. nad noted.

## 2012-02-24 NOTE — ED Notes (Signed)
Started to advance the gastric tube and pt told me to stop that he could not take it. Pt refused the gastric tube.

## 2012-02-24 NOTE — ED Notes (Signed)
Pt is aware of a urine sample, pt states he is unable to void at this time.  

## 2012-02-24 NOTE — ED Notes (Signed)
Attempted to call report on pt but nurse unable at this time.

## 2012-02-25 ENCOUNTER — Encounter (HOSPITAL_COMMUNITY): Payer: Self-pay | Admitting: *Deleted

## 2012-02-25 ENCOUNTER — Encounter (HOSPITAL_COMMUNITY): Payer: Self-pay | Admitting: Anesthesiology

## 2012-02-25 ENCOUNTER — Encounter (HOSPITAL_COMMUNITY)
Admission: EM | Disposition: A | Discharge status: Skilled Nursing Facility | Payer: Self-pay | Source: Ambulatory Visit | Attending: Pulmonary Disease

## 2012-02-25 ENCOUNTER — Inpatient Hospital Stay (HOSPITAL_COMMUNITY): Payer: Medicare Other | Admitting: Anesthesiology

## 2012-02-25 HISTORY — PX: BOWEL RESECTION: SHX1257

## 2012-02-25 HISTORY — PX: LAPAROTOMY: SHX154

## 2012-02-25 LAB — BASIC METABOLIC PANEL
CO2: 28 mEq/L (ref 19–32)
Chloride: 101 mEq/L (ref 96–112)
GFR calc Af Amer: >90 mL/min (ref >90)
Potassium: 4 mEq/L (ref 3.5–5.1)

## 2012-02-25 LAB — CBC
HCT: 48.7 % (ref 39.0–52.0)
Hemoglobin: 16.6 g/dL (ref 13.0–17.0)
MCV: 90.5 fL (ref 78.0–100.0)
WBC: 12.2 10*3/uL — ABNORMAL HIGH (ref 4.0–10.5)

## 2012-02-25 LAB — SURGICAL PCR SCREEN: Staphylococcus aureus: NEGATIVE

## 2012-02-25 SURGERY — LAPAROTOMY, EXPLORATORY
Anesthesia: General | Site: Abdomen | Wound class: Dirty or Infected

## 2012-02-25 MED ORDER — ROCURONIUM BROMIDE 100 MG/10ML IV SOLN
INTRAVENOUS | Status: DC | PRN
  Administered 2012-02-25: 40 mg via INTRAVENOUS
  Administered 2012-02-25: 10 mg via INTRAVENOUS

## 2012-02-25 MED ORDER — SUCCINYLCHOLINE CHLORIDE 20 MG/ML IJ SOLN
INTRAMUSCULAR | Status: AC
  Filled 2012-02-25: qty 1

## 2012-02-25 MED ORDER — LACTATED RINGERS IV SOLN
INTRAVENOUS | Status: DC
  Administered 2012-02-25: 1000 mL via INTRAVENOUS
  Administered 2012-02-25 (×2): via INTRAVENOUS

## 2012-02-25 MED ORDER — MIDAZOLAM HCL 2 MG/2ML IJ SOLN
INTRAMUSCULAR | Status: AC
  Filled 2012-02-25: qty 2

## 2012-02-25 MED ORDER — DIPHENHYDRAMINE HCL 50 MG/ML IJ SOLN: 12.5000 mg | Freq: Four times a day (QID) | INTRAMUSCULAR | Status: DC | PRN

## 2012-02-25 MED ORDER — ETOMIDATE 2 MG/ML IV SOLN
INTRAVENOUS | Status: AC
  Filled 2012-02-25: qty 10

## 2012-02-25 MED ORDER — LABETALOL HCL 5 MG/ML IV SOLN
INTRAVENOUS | Status: DC | PRN
  Administered 2012-02-25: 10 mg via INTRAVENOUS
  Administered 2012-02-25 (×2): 5 mg via INTRAVENOUS

## 2012-02-25 MED ORDER — MIDAZOLAM HCL 2 MG/2ML IJ SOLN
INTRAMUSCULAR | Status: AC
  Administered 2012-02-25: 2 mg via INTRAVENOUS
  Filled 2012-02-25: qty 2

## 2012-02-25 MED ORDER — DEXTROSE 5 % IV SOLN: 1.0000 g | INTRAVENOUS | Status: DC

## 2012-02-25 MED ORDER — PHENOL 1.4 % MT LIQD
1.0000 | OROMUCOSAL | Status: DC | PRN
  Administered 2012-02-26 (×2): 1 via OROMUCOSAL
  Filled 2012-02-25 (×2): qty 177

## 2012-02-25 MED ORDER — ACETAMINOPHEN 325 MG PO TABS
325.0000 mg | ORAL_TABLET | ORAL | Status: DC | PRN
  Administered 2012-02-28: 650 mg via ORAL
  Filled 2012-02-25: qty 2

## 2012-02-25 MED ORDER — NALOXONE HCL 0.4 MG/ML IJ SOLN: 0.4000 mg | INTRAMUSCULAR | Status: DC | PRN

## 2012-02-25 MED ORDER — ONDANSETRON HCL 4 MG/2ML IJ SOLN
INTRAMUSCULAR | Status: AC
  Administered 2012-02-25: 4 mg via INTRAVENOUS
  Filled 2012-02-25: qty 2

## 2012-02-25 MED ORDER — GLYCOPYRROLATE 0.2 MG/ML IJ SOLN
0.2000 mg | Freq: Once | INTRAMUSCULAR | Status: AC
  Administered 2012-02-25: 0.2 mg via INTRAVENOUS

## 2012-02-25 MED ORDER — SUCCINYLCHOLINE CHLORIDE 20 MG/ML IJ SOLN
INTRAMUSCULAR | Status: DC | PRN
  Administered 2012-02-25: 140 mg via INTRAVENOUS

## 2012-02-25 MED ORDER — GLYCOPYRROLATE 0.2 MG/ML IJ SOLN
INTRAMUSCULAR | Status: AC
  Filled 2012-02-25: qty 1

## 2012-02-25 MED ORDER — PROPOFOL 10 MG/ML IV EMUL
INTRAVENOUS | Status: AC
  Filled 2012-02-25: qty 20

## 2012-02-25 MED ORDER — SODIUM CHLORIDE 0.9 % IJ SOLN
9.0000 mL | INTRAMUSCULAR | Status: DC | PRN
  Filled 2012-02-25 (×3): qty 3

## 2012-02-25 MED ORDER — MIDAZOLAM HCL 2 MG/2ML IJ SOLN
1.0000 mg | INTRAMUSCULAR | Status: DC | PRN
  Administered 2012-02-25: 2 mg via INTRAVENOUS

## 2012-02-25 MED ORDER — HYDRALAZINE HCL 20 MG/ML IJ SOLN
INTRAMUSCULAR | Status: AC
  Filled 2012-02-25: qty 1

## 2012-02-25 MED ORDER — HYDROMORPHONE 0.3 MG/ML IV SOLN
INTRAVENOUS | Status: DC
  Administered 2012-02-25: 16:00:00 via INTRAVENOUS
  Administered 2012-02-26: 1.5 mg via INTRAVENOUS
  Administered 2012-02-26: 4.8 mg via INTRAVENOUS
  Administered 2012-02-26: 06:00:00 via INTRAVENOUS
  Administered 2012-02-26: 0.9 mg via INTRAVENOUS
  Filled 2012-02-25 (×2): qty 25

## 2012-02-25 MED ORDER — GLYCOPYRROLATE 0.2 MG/ML IJ SOLN
INTRAMUSCULAR | Status: DC | PRN
  Administered 2012-02-25: 0.2 mg via INTRAVENOUS

## 2012-02-25 MED ORDER — FENTANYL CITRATE 0.05 MG/ML IJ SOLN
INTRAMUSCULAR | Status: DC | PRN
  Administered 2012-02-25 (×2): 50 ug via INTRAVENOUS
  Administered 2012-02-25: 75 ug via INTRAVENOUS
  Administered 2012-02-25: 50 ug via INTRAVENOUS
  Administered 2012-02-25: 25 ug via INTRAVENOUS
  Administered 2012-02-25: 100 ug via INTRAVENOUS
  Administered 2012-02-25: 75 ug via INTRAVENOUS

## 2012-02-25 MED ORDER — MIDAZOLAM HCL 5 MG/5ML IJ SOLN
INTRAMUSCULAR | Status: DC | PRN
  Administered 2012-02-25: 2 mg via INTRAVENOUS

## 2012-02-25 MED ORDER — DEXTROSE 5 % IV SOLN
1.0000 g | INTRAVENOUS | Status: DC | PRN
  Administered 2012-02-25: 1 g via INTRAVENOUS

## 2012-02-25 MED ORDER — FENTANYL CITRATE 0.05 MG/ML IJ SOLN
25.0000 ug | INTRAMUSCULAR | Status: DC | PRN
  Administered 2012-02-27 – 2012-02-28 (×9): 50 ug via INTRAVENOUS
  Administered 2012-02-29: 200 ug via INTRAVENOUS
  Filled 2012-02-25 (×8): qty 2
  Filled 2012-02-25: qty 4
  Filled 2012-02-25: qty 2

## 2012-02-25 MED ORDER — HYDROMORPHONE HCL PF 1 MG/ML IJ SOLN
0.5000 mg | INTRAMUSCULAR | Status: DC | PRN
  Administered 2012-02-25 – 2012-02-27 (×3): 0.5 mg via INTRAVENOUS
  Filled 2012-02-25: qty 1

## 2012-02-25 MED ORDER — HYDROMORPHONE HCL PF 1 MG/ML IJ SOLN
INTRAMUSCULAR | Status: AC
  Administered 2012-02-25: 0.5 mg via INTRAVENOUS
  Filled 2012-02-25: qty 1

## 2012-02-25 MED ORDER — LIDOCAINE HCL (PF) 1 % IJ SOLN
INTRAMUSCULAR | Status: AC
  Filled 2012-02-25: qty 5

## 2012-02-25 MED ORDER — ALBUTEROL SULFATE (5 MG/ML) 0.5% IN NEBU
INHALATION_SOLUTION | RESPIRATORY_TRACT | Status: AC
  Filled 2012-02-25: qty 0.5

## 2012-02-25 MED ORDER — FENTANYL CITRATE 0.05 MG/ML IJ SOLN
INTRAMUSCULAR | Status: AC
  Filled 2012-02-25: qty 5

## 2012-02-25 MED ORDER — SODIUM CHLORIDE 0.9 % IR SOLN
Status: DC | PRN
  Administered 2012-02-25: 1000 mL

## 2012-02-25 MED ORDER — ALBUTEROL SULFATE (5 MG/ML) 0.5% IN NEBU
2.5000 mg | INHALATION_SOLUTION | RESPIRATORY_TRACT | Status: DC | PRN
  Administered 2012-02-25 – 2012-02-26 (×7): 2.5 mg via RESPIRATORY_TRACT
  Filled 2012-02-25 (×6): qty 0.5

## 2012-02-25 MED ORDER — ONDANSETRON HCL 4 MG/2ML IJ SOLN: 4.0000 mg | Freq: Once | INTRAMUSCULAR | Status: AC | PRN

## 2012-02-25 MED ORDER — GLYCOPYRROLATE 0.2 MG/ML IJ SOLN
INTRAMUSCULAR | Status: AC
  Administered 2012-02-25: 0.2 mg via INTRAVENOUS
  Filled 2012-02-25: qty 1

## 2012-02-25 MED ORDER — HYDROMORPHONE 0.3 MG/ML IV SOLN
INTRAVENOUS | Status: AC
  Filled 2012-02-25: qty 25

## 2012-02-25 MED ORDER — HYDRALAZINE HCL 20 MG/ML IJ SOLN
INTRAMUSCULAR | Status: DC | PRN
  Administered 2012-02-25 (×3): 2 mg via INTRAVENOUS

## 2012-02-25 MED ORDER — ROCURONIUM BROMIDE 50 MG/5ML IV SOLN
INTRAVENOUS | Status: AC
  Filled 2012-02-25: qty 1

## 2012-02-25 MED ORDER — ETOMIDATE 2 MG/ML IV SOLN
INTRAVENOUS | Status: DC | PRN
  Administered 2012-02-25: 14 mg via INTRAVENOUS

## 2012-02-25 MED ORDER — ONDANSETRON HCL 4 MG/2ML IJ SOLN
4.0000 mg | Freq: Once | INTRAMUSCULAR | Status: AC
  Administered 2012-02-25: 4 mg via INTRAVENOUS

## 2012-02-25 MED ORDER — METOPROLOL TARTRATE 1 MG/ML IV SOLN
5.0000 mg | Freq: Four times a day (QID) | INTRAVENOUS | Status: DC
  Administered 2012-02-25 – 2012-02-29 (×8): 5 mg via INTRAVENOUS
  Filled 2012-02-25 (×9): qty 5

## 2012-02-25 MED ORDER — POVIDONE-IODINE 10 % EX OINT
TOPICAL_OINTMENT | CUTANEOUS | Status: AC
  Filled 2012-02-25: qty 2

## 2012-02-25 MED ORDER — DIPHENHYDRAMINE HCL 12.5 MG/5ML PO ELIX
12.5000 mg | ORAL_SOLUTION | Freq: Four times a day (QID) | ORAL | Status: DC | PRN
  Filled 2012-02-25: qty 5

## 2012-02-25 MED ORDER — NEOSTIGMINE METHYLSULFATE 1 MG/ML IJ SOLN
INTRAMUSCULAR | Status: DC | PRN
  Administered 2012-02-25: 2 mg via INTRAVENOUS

## 2012-02-25 MED ORDER — LABETALOL HCL 5 MG/ML IV SOLN
INTRAVENOUS | Status: AC
  Filled 2012-02-25: qty 4

## 2012-02-25 MED ORDER — DEXTROSE 5 % IV SOLN
INTRAVENOUS | Status: AC
  Filled 2012-02-25: qty 1

## 2012-02-25 SURGICAL SUPPLY — 68 items
APPLIER CLIP 11 MED OPEN (CLIP)
APPLIER CLIP 13 LRG OPEN (CLIP)
APR CLP LRG 13 20 CLIP (CLIP)
APR CLP MED 11 20 MLT OPN (CLIP)
BAG HAMPER (MISCELLANEOUS) ×3 IMPLANT
BARRIER SKIN 2 3/4 (OSTOMY) IMPLANT
BARRIER SKIN OD2.25 2 3/4 FLNG (OSTOMY) IMPLANT
BRR ADH 6X5 SEPRAFILM 1 SHT (MISCELLANEOUS) ×2
BRR SKN FLT 2.75X2.25 2 PC (OSTOMY)
CLAMP POUCH DRAINAGE QUIET (OSTOMY) IMPLANT
CLIP APPLIE 11 MED OPEN (CLIP) IMPLANT
CLIP APPLIE 13 LRG OPEN (CLIP) IMPLANT
CLOTH BEACON ORANGE TIMEOUT ST (SAFETY) ×3 IMPLANT
COVER LIGHT HANDLE STERIS (MISCELLANEOUS) ×6 IMPLANT
DRAPE WARM FLUID 44X44 (DRAPE) ×3 IMPLANT
DURAPREP 26ML APPLICATOR (WOUND CARE) ×3 IMPLANT
ELECT BLADE 6 FLAT ULTRCLN (ELECTRODE) ×4 IMPLANT
ELECT REM PT RETURN 9FT ADLT (ELECTROSURGICAL) ×3
ELECTRODE REM PT RTRN 9FT ADLT (ELECTROSURGICAL) ×2 IMPLANT
EVACUATOR DRAINAGE 10X20 100CC (DRAIN) IMPLANT
EVACUATOR SILICONE 100CC (DRAIN) ×3
GLOVE BIOGEL PI IND STRL 7.5 (GLOVE) ×2 IMPLANT
GLOVE BIOGEL PI INDICATOR 7.5 (GLOVE) ×2
GLOVE ECLIPSE 6.5 STRL STRAW (GLOVE) ×3 IMPLANT
GLOVE ECLIPSE 7.0 STRL STRAW (GLOVE) ×5 IMPLANT
GLOVE SS BIOGEL STRL SZ 6.5 (GLOVE) IMPLANT
GLOVE SUPERSENSE BIOGEL SZ 6.5 (GLOVE) ×1
GOWN STRL REIN XL XLG (GOWN DISPOSABLE) ×9 IMPLANT
INST SET MAJOR GENERAL (KITS) ×3 IMPLANT
KIT ROOM TURNOVER APOR (KITS) ×3 IMPLANT
MANIFOLD NEPTUNE II (INSTRUMENTS) ×3 IMPLANT
NS IRRIG 1000ML POUR BTL (IV SOLUTION) ×3 IMPLANT
PACK ABDOMINAL MAJOR (CUSTOM PROCEDURE TRAY) ×3 IMPLANT
PAD ARMBOARD 7.5X6 YLW CONV (MISCELLANEOUS) ×3 IMPLANT
POUCH OSTOMY 2 3/4  H 3804 (WOUND CARE)
POUCH OSTOMY 2 3/4 H 3804 (WOUND CARE)
POUCH OSTOMY 2 PC DRNBL 2.75 (WOUND CARE) IMPLANT
RELOAD LINEAR CUT PROX 55 BLUE (ENDOMECHANICALS) ×6 IMPLANT
RELOAD PROXIMATE 75MM BLUE (ENDOMECHANICALS) IMPLANT
RELOAD STAPLE 55 3.8 BLU REG (ENDOMECHANICALS) IMPLANT
RELOAD STAPLE 75 3.8 BLU REG (ENDOMECHANICALS) IMPLANT
RETRACTOR WND ALEXIS 25 LRG (MISCELLANEOUS) IMPLANT
RETRACTOR WOUND ALXS 34CM XLRG (MISCELLANEOUS) IMPLANT
RTRCTR WOUND ALEXIS 25CM LRG (MISCELLANEOUS)
RTRCTR WOUND ALEXIS 34CM XLRG (MISCELLANEOUS)
SEALER TISSUE G2 CVD JAW 35 (ENDOMECHANICALS) IMPLANT
SEALER TISSUE G2 CVD JAW 45CM (ENDOMECHANICALS) ×1
SEALER TISSUE X1 CVD JAW (INSTRUMENTS) ×1 IMPLANT
SEPRAFILM MEMBRANE 5X6 (MISCELLANEOUS) ×1 IMPLANT
SET BASIN LINEN APH (SET/KITS/TRAYS/PACK) ×3 IMPLANT
SPONGE DRAIN TRACH 4X4 STRL 2S (GAUZE/BANDAGES/DRESSINGS) ×1 IMPLANT
SPONGE GAUZE 4X4 12PLY (GAUZE/BANDAGES/DRESSINGS) ×3 IMPLANT
STAPLER GUN LINEAR PROX 60 (STAPLE) IMPLANT
STAPLER PROXIMATE 55 BLUE (STAPLE) ×1 IMPLANT
STAPLER PROXIMATE 75MM BLUE (STAPLE) IMPLANT
STAPLER VISISTAT 35W (STAPLE) ×3 IMPLANT
SUCTION POOLE TIP (SUCTIONS) ×3 IMPLANT
SUT CHROMIC 0 SH (SUTURE) IMPLANT
SUT CHROMIC 2 0 SH (SUTURE) IMPLANT
SUT ETHILON 3 0 FSL (SUTURE) ×1 IMPLANT
SUT NOVA NAB GS-26 0 60 (SUTURE) ×6 IMPLANT
SUT SILK 2 0 (SUTURE) ×3
SUT SILK 2-0 18XBRD TIE 12 (SUTURE) ×2 IMPLANT
SUT SILK 3 0 SH CR/8 (SUTURE) ×4 IMPLANT
SUT VIC AB 3-0 SH 27 (SUTURE) ×3
SUT VIC AB 3-0 SH 27X BRD (SUTURE) IMPLANT
TAPE CLOTH SURG 4X10 WHT LF (GAUZE/BANDAGES/DRESSINGS) ×1 IMPLANT
TRAY FOLEY CATH 14FR (SET/KITS/TRAYS/PACK) ×3 IMPLANT

## 2012-02-25 NOTE — Progress Notes (Signed)
Attempted three times to put in NG Tube by [facility] and patient unable to tolerate. Attempted with water and without water, but unsuccessful.

## 2012-02-25 NOTE — Op Note (Signed)
Patient:  Michael Ellison  DOB:  1946/06/29  MRN:  [id number]   Preop Diagnosis:  Small bowel obstruction  Postop Diagnosis:  Same  Procedure:  Exploratory laparotomy, small bowel resection, intra-abdominal drain placement  Surgeon: Dr. Tilford Pillar  Anes:  General endotracheal  Indications:  Patient is a 66 year old present in the paraspinal denies vomiting abdominal pain. Workup was consistent for a small bowel structure. Patient was admitted for initial conservative management. However patient's symptomatology continue to persist and given the difficulty getting a nasogastric tube placed it was advised the patient got up to the operating room for exploratory laparotomy possible bowel resection. Risks benefits and alternatives of exploratory laparotomy were discussed at length with the patient including but not limited to risk of bleeding, infection, anastomotic region anastomosis be required, intraoperative cardiac and pulmonary events. Patient's questions and concerns are addressed the patient consented to the procedure.  Procedure note:  Patient was taken to the operating is placed the supine is on the OR table. His blood gas that was administered the patient was decannulated in a nurse anesthetist. Additionally nasogastric he was placed during this time now to help with gastric decompression. A Foley catheter was also placed using standard sterile technique by myself.  DuraPrep was utilized to prep the abdomen and the abdomen was draped in standard fashion. A midline incision was created with a left lateral keyhole defect around the umbilicus with a 10 blade scalpel. Additional dissection down to the subcuticular tissues. I using electrocautery and cleaned admission the fascia. The fascia is grasped with Coker clamps and is lifted anteriorly. Hemostats are utilized to grasp the peritoneum which is sharply entered into with Metzenbaum scissors. The peritoneum was then opened both superiorly  and anteriorly with electrocautery. Several omental adhesions were noted within the lower abdomen inner divided using a combination of sharp Metzenbaum scissor dissection and electrocautery. At this point I applied a enlarged dilated loop of small intestine and followed this distally. It did track down to the pelvis. At this point I identify the loop of small intestine that was adherent in the lower pelvis. I did attempt to free this from a pelvic wall with sharp Metzenbaum scissor dissection. At that extreme care not to injure the bladder which was intimately involved in the area of dissection as well. During the dissection I did stay close to the serosa in the did end up with a serosal defect in enterotomy into the small bowel. The spillage was quickly controlled using a pool sucker. A small Allis freed from the pelvis and brought up into the field. The small bowel appeared inflamed with a noted friable serosa on the length of the catheter portion of small bowel. I did note that there were 2 small enterotomies in close approximation at this time besides stated perform a bowel resection. I did place a Kelly clamp both proximally and distally to both of the defects to prevent any additional spillage. I additionally utilized the enterotomies to decompress the bowel both distally and proximally. A large amount of enteric foul-smelling fluid was obtained from both directions. After decompressing the bowel I rated a mesenteric defect was proximally and distally to the planned area division. A GIA 55 stapler was utilized to divide the bowel both proximally and distally with 2 pre-Lasix. The mesentery of the isolated segment of small intestine was divided using the LigaSure bipolar device. At this point the small bowel was free it was placed onto the back table assessment the specimen to  pathology. Faces was good. One area of bleeding was quickly controlled with a 3-0 silk in a figure-of-eight fashion. 2 ends of the  divided small intestine in close approximation in a side-to-side fashion. They were pexed with a 3-0 silk. An enterotomy was created on both ends a small intestine. A reloaded the Endo GIA 55 stapler was utilized to create the staple anastomoses. A 3-0 silk was then utilized to close the remaining enterotomy in simple interrupted fashion. The apex of the staple anastomoses was also reinforced with a 3-0 silk. The mesenteric defect was closed using a 3-0 silk in interrupted fashion. Additionally I am located the staple lines on both ends of the small intestine with 3-0 silk. At this time is quite pleased with the appearance of her care. I did run the bowel in its entirety. The remainder the bowel looked good. There was one small area in distal to the anastomosis which had a small serosal tear. I did place a 3-0 silk to imbricate the serosal tear. Additionally at right tongue of omentum down into this area and it secured it with the imbricating suture over this area.  At this point I copiously irrigated the abdominal cavity and all 4 quadrants with sterile saline. Returning aspirate was clear. Hemostasis was excellent. I did inspect the bladder wall did not see any evidence of any injury to the bladder wall itself or any leakage of any urine. Due to the amount of inflammation down in the lower part of the LAD she is to place a 10 flat JP drain. This was brought through separate stab incision in the anterior abdominal wall along the right side. I also placed a piece of Seprafilm into the pelvis to minimize additional future adhesions. Plan I palpated and nasogastric tube are noted as being excellent position within the stomach. The liver spleen felt normal without any abnormalities. The large intestine fell normal without any abnormalities. The omentum was returned back over the small intestine. And at this time I turned my attention to closure.  An O-looped Novafil times 2 was utilized to reapproximate the fascia.  The initial suture started anteriorly and is brought to the midportion of the incision. The second Novafil suture started superiorly was brought to the first and secured to each other. The tails of the suture were cut into the subcuticular space. The subcuticular space was irrigated. The skin edges were reapproximated using skin staples. The JP drain was secured to the skin with a 3-0 nylon. The skin was washed and dried and a moist and dry towel. Sterile dressings were placed. Drains removed dressings were secured. The patient was allowed to come out of general anesthetic was changed to the postanesthetic care unit in stable condition. At the conclusion of the procedure all instrument, sponge, needle counts are correct. Patient tolerated procedure extremely well.  Complications:  None apparent  EBL:  150 mLs   Specimen:  Small bowel

## 2012-02-25 NOTE — Anesthesia Postprocedure Evaluation (Signed)
Anesthesia Post Note  Patient: Michael Ellison  Procedure(s) Performed: Procedure(s) (LRB): EXPLORATORY LAPAROTOMY (N/A) SMALL BOWEL RESECTION ()  Anesthesia type: General  Patient location: PACU  Post pain: Pain level controlled  Post assessment: Post-op Vital signs reviewed, Patient's Cardiovascular Status Stable, Respiratory Function Stable, Patent Airway, No signs of Nausea or vomiting and Pain level controlled  Last Vitals:  Filed Vitals:   02/25/12 1344  BP:   Pulse: 119  Temp: 36.8 C  Resp: 36    Post vital signs: Reviewed and stable 166/90  Level of consciousness: awake and alert   Complications: No apparent anesthesia complications

## 2012-02-25 NOTE — Addendum Note (Signed)
Addendum  created 02/25/12 1423 by Roselie Awkward, MD   Modules edited:Orders

## 2012-02-25 NOTE — Transfer of Care (Signed)
Immediate Anesthesia Transfer of Care Note  Patient: Michael Ellison  Procedure(s) Performed: Procedure(s) (LRB): EXPLORATORY LAPAROTOMY (N/A) SMALL BOWEL RESECTION ()  Patient Location: PACU  Anesthesia Type: General  Level of Consciousness: awake  Airway & Oxygen Therapy: Patient Spontanous Breathing and non-rebreather face mask  Post-op Assessment: Report given to PACU RN, Post -op Vital signs reviewed and stable and Patient moving all extremities  Post vital signs: Reviewed and stable  Complications: No apparent anesthesia complications

## 2012-02-25 NOTE — Anesthesia Preprocedure Evaluation (Addendum)
Anesthesia Evaluation  Patient identified by MRN, date of birth, ID band Patient awake    Reviewed: Allergy & Precautions, H&P , NPO status , Patient's Chart, lab work & pertinent test results  Airway Mallampati: I TM Distance: >3 FB Neck ROM: Full    Dental No notable dental hx. (+) Teeth Intact   Pulmonary COPD COPD inhaler, Current Smoker,    Pulmonary exam normal       Cardiovascular hypertension, Pt. on medications Rhythm:Regular Rate:Tachycardia     Neuro/Psych negative psych ROS   GI/Hepatic negative GI ROS, Neg liver ROS,   Endo/Other  negative endocrine ROS  Renal/GU negative Renal ROS Bladder dysfunction (Ca of prostate)      Musculoskeletal negative musculoskeletal ROS (+)   Abdominal (+)  Abdomen: rigid.    Peds  Hematology negative hematology ROS (+)   Anesthesia Other Findings   Reproductive/Obstetrics                          Anesthesia Physical Anesthesia Plan  ASA: III  Anesthesia Plan: General   Post-op Pain Management:    Induction: Intravenous, Rapid sequence and Cricoid pressure planned  Airway Management Planned: Oral ETT  Additional Equipment:   Intra-op Plan:   Post-operative Plan: Possible Post-op intubation/ventilation  Informed Consent: I have reviewed the patients History and Physical, chart, labs and discussed the procedure including the risks, benefits and alternatives for the proposed anesthesia with the patient or authorized representative who has indicated his/her understanding and acceptance.   Dental advisory given  Plan Discussed with: CRNA  Anesthesia Plan Comments: (Discussed possibility of post op ventilation with pt and family...will assess adequacy of spontaneous ventilation postop in OR prior to extubation.)        Anesthesia Quick Evaluation

## 2012-02-25 NOTE — OR Nursing (Signed)
Seprafilm adhesion barrier 5"x 6", lot# [id number], ex.12-2012, placed intra-abdominaly per Dr. Leticia Penna.

## 2012-02-25 NOTE — Progress Notes (Signed)
Subjective: Continued nausea. Has had several episodes of emesis. Nasogastric tube was attempted to place this morning however patient states she fell it was curled in the back of his throat and quickly coughed the tube up. He denies any increased flatus. No bowel function. His abdominal pain is about the same.  Objective: Vital signs in last 24 hours: Temp:  [97.6 F (36.4 C)-97.9 F (36.6 C)] 97.7 F (36.5 C) (05/10 0442) Pulse Rate:  [96-101] 99  (05/10 0442) Resp:  [17-20] 18  (05/10 0442) BP: (144-169)/(89-106) 152/89 mmHg (05/10 0442) SpO2:  [92 %-98 %] 95 % (05/10 0442) Weight:  [82.8 kg (182 lb 8.7 oz)] 82.8 kg (182 lb 8.7 oz) (05/09 1403) Last BM Date: 02/23/12  Intake/Output from previous day:   Intake/Output this shift:    General appearance: alert, no distress and Appears uncomfortable Resp: clear to auscultation bilaterally Cardio: regular rate and rhythm GI: Quiet bowel sounds, and soft, and distended, mild diffuse tenderness. No peritoneal signs. No masses or hernias are apparent. Abdomen is tympanitic  Lab Results:   Basename 02/25/12 0549 02/24/12 0858  WBC 12.2* 20.1*  HGB 16.6 17.6*  HCT 48.7 50.6  PLT 266 258   BMET  Basename 02/25/12 0549 02/24/12 0858  NA 139 139  K 4.0 3.9  CL 101 100  CO2 28 28  GLUCOSE 138* 184*  BUN 19 12  CREATININE 0.80 0.85  CALCIUM 9.7 10.7*   PT/INR No results found for this basename: LABPROT:2,INR:2 in the last 72 hours ABG No results found for this basename: PHART:2,PCO2:2,PO2:2,HCO3:2 in the last 72 hours  Studies/Results: Ct Abdomen Pelvis W Contrast  02/24/2012  *RADIOLOGY REPORT*  Clinical Data: Abdominal pain, nausea and vomiting.  History of prostate cancer.  CT ABDOMEN AND PELVIS WITH CONTRAST  Technique:  Multidetector CT imaging of the abdomen and pelvis was performed following the standard protocol during bolus administration of intravenous contrast.  Contrast: OMNIPAQUE IOHEXOL 300 MG/ML  SOLN  CT  Comparison: CT abdomen and pelvis 02/10/2009.  Findings: Bibasilar atelectatic change is noted.  No pleural or pericardial effusion.  Heart size is upper normal.  The patient has a small bowel obstruction.  Small bowel loops are dilated up to 4.1 cm.  No pneumatosis, free intraperitoneal air or portal venous gas is identified.  Transition point appears to be in the right lower quadrant.  The patient does have a small right inguinal hernia containing a knuckle of small bowel but this does not appear to be the transition point.  A single small low attenuation lesion in the right hepatic lobe is unchanged.  The liver is otherwise unremarkable.  The gallbladder, adrenal glands, pancreas and spleen appear normal.  There is colonic diverticulosis without diverticulitis. Fat containing umbilical hernia is noted.  No focal bony abnormality.  IMPRESSION:  1.  Study is positive for small bowel obstruction.  Obstruction appears to be due to adhesions in the right lower quadrant.  There is a small right inguinal hernia containing a knuckle of small bowel but this does not appear to be the cause of patient's obstruction. 2.  No change in bilateral renal cysts and a single small hepatic cyst. 3.  Diverticulosis without diverticulitis.  Original Report Authenticated By: Bernadene Bell. Maricela Curet, M.D.    Anti-infectives: Anti-infectives    None      Assessment/Plan: s/p Procedure(s) (LRB): EXPLORATORY LAPAROTOMY (N/A) Small bowel obstruction. Unfortunately due to the patient's continued distention and lack of progression I do feel patient's warrants  exploration in the operating room. I did discuss at length with the patient's benefits of proceeding to the operating room. Y. do not feel that there is an emergent indication I do feel it is unlikely at this time the patient will continue to progress with conservative management alone. Especially given the fact that he's had difficulty getting a nasogastric tube placed and  currently does not wish to proceed with any additional times while he is awake. I do feel the placement of the nasogastric tube while the patient was asleep during his exploratory laparotomy will be extremely beneficial to his overall recovery. I had a long discussion with the patient and his daughters regarding the risks benefits and alternatives. We discussed the possible need of bowel resection. Risk including but not limited to risk of bleeding, infection, bowel injury, anastomotic leak should it be required, intraoperative cardiac and pulmonary events were discussed with the patient. Will plan to proceed to the operating room as discussed.  LOS: 1 day    Ketara Cavness C 02/25/2012

## 2012-02-25 NOTE — Progress Notes (Signed)
Called to patient's room, patient vomiting, NG tube coiled in patient's mouth, patient refused to try to reposition NG tube, NG pulled, canceled KUB

## 2012-02-25 NOTE — Anesthesia Procedure Notes (Signed)
Procedure Name: Intubation Date/Time: 02/25/2012 11:48 AM Performed by: Glynn Octave E Pre-anesthesia Checklist: Patient identified, Patient being monitored, Timeout performed, Emergency Drugs available and Suction available Patient Re-evaluated:Patient Re-evaluated prior to inductionOxygen Delivery Method: Circle System Utilized Preoxygenation: Pre-oxygenation with 100% oxygen Intubation Type: IV induction, Rapid sequence and Cricoid Pressure applied Laryngoscope Size: Mac and 3 Grade View: Grade I Tube type: Oral Tube size: 8.0 mm Number of attempts: 1 Airway Equipment and Method: stylet Placement Confirmation: ETT inserted through vocal cords under direct vision,  positive ETCO2 and breath sounds checked- equal and bilateral Secured at: 24 cm Tube secured with: Tape Dental Injury: Teeth and Oropharynx as per pre-operative assessment  Comments: NG placed prior to induction due to patient spitting up emesis despite HOB up.  Dr. Tollie Eth in for induction.  No apparent aspiration during intubation.

## 2012-02-25 NOTE — Progress Notes (Signed)
12 fr placed in left nare, patient unable to tolerate 16 fr, faint ausculation heard, small amount of reddish brown returned, O2 Sat 94% on 2L, called Dr. Leticia Penna, new order given for portable KUB to check for placement

## 2012-02-25 NOTE — H&P (Signed)
Michael Ellison is an 66 y.o. male.   Chief Complaint: Nausea vomiting and abdominal pain HPI: Patient states his symptomatology started approximately 24 hours ago after dinner. He states he had a meal and noted increased nausea and correlated diffuse abdominal pain. Both her nausea and abdominal pain continue to increase the patient presented to the emergency department for evaluation. He denies any similar symptomatology in the past. He does state the day prior he had a bowel movement which she reports is normal. Since that time he has not had any additional bowel function. He's only had intermittent episodes of flatus from below. Emesis has been nonbloody. His appetite has diminished. He denies any fevers or chills. No family history of any inflammatory bowel processes or colon cancers. Patient is unsure whether he has ever had a colonoscopy before although based on patient's discussion it is unlikely he has ever had a colonoscopy.  Past Medical History  Diagnosis Date  . Hypertension   . Cancer   . Cancer of prostate     Past Surgical History  Procedure Date  . Hernia repair   . Prostate biopsy     History reviewed. No pertinent family history. Social History:  reports that he has been smoking.  He does not have any smokeless tobacco history on file. He reports that he drinks alcohol. He reports that he does not use illicit drugs.  Allergies: No Known Allergies  Medications Prior to Admission  Medication Sig Dispense Refill  . amLODipine (NORVASC) 5 MG tablet Take 5 mg by mouth daily.      Marland Kitchen imipramine (TOFRANIL) 25 MG tablet Take 25 mg by mouth at bedtime.      Marland Kitchen lisinopril (PRINIVIL,ZESTRIL) 20 MG tablet Take 20 mg by mouth daily.        Results for orders placed during the hospital encounter of 02/24/12 (from the past 48 hour(s))  CBC     Status: Abnormal   Collection Time   02/24/12  8:58 AM      Component Value Range Comment   WBC 20.1 (*) 4.0 - 10.5 (K/uL)    RBC 5.66   4.22 - 5.81 (MIL/uL)    Hemoglobin 17.6 (*) 13.0 - 17.0 (g/dL)    HCT 40.9  81.1 - 91.4 (%)    MCV 89.4  78.0 - 100.0 (fL)    MCH 31.1  26.0 - 34.0 (pg)    MCHC 34.8  30.0 - 36.0 (g/dL)    RDW 78.2  95.6 - 21.3 (%)    Platelets 258  150 - 400 (K/uL)   DIFFERENTIAL     Status: Abnormal   Collection Time   02/24/12  8:58 AM      Component Value Range Comment   Neutrophils Relative 93 (*) 43 - 77 (%)    Neutro Abs 18.7 (*) 1.7 - 7.7 (K/uL)    Lymphocytes Relative 4 (*) 12 - 46 (%)    Lymphs Abs 0.7  0.7 - 4.0 (K/uL)    Monocytes Relative 3  3 - 12 (%)    Monocytes Absolute 0.7  0.1 - 1.0 (K/uL)    Eosinophils Relative 0  0 - 5 (%)    Eosinophils Absolute 0.0  0.0 - 0.7 (K/uL)    Basophils Relative 0  0 - 1 (%)    Basophils Absolute 0.0  0.0 - 0.1 (K/uL)   COMPREHENSIVE METABOLIC PANEL     Status: Abnormal   Collection Time   02/24/12  8:58 AM  Component Value Range Comment   Sodium 139  135 - 145 (mEq/L)    Potassium 3.9  3.5 - 5.1 (mEq/L)    Chloride 100  96 - 112 (mEq/L)    CO2 28  19 - 32 (mEq/L)    Glucose, Bld 184 (*) 70 - 99 (mg/dL)    BUN 12  6 - 23 (mg/dL)    Creatinine, Ser 1.61  0.50 - 1.35 (mg/dL)    Calcium 09.6 (*) 8.4 - 10.5 (mg/dL)    Total Protein 8.7 (*) 6.0 - 8.3 (g/dL)    Albumin 4.3  3.5 - 5.2 (g/dL)    AST 24  0 - 37 (U/L)    ALT 22  0 - 53 (U/L)    Alkaline Phosphatase 64  39 - 117 (U/L)    Total Bilirubin 0.9  0.3 - 1.2 (mg/dL)    GFR calc non Af Amer >90  >90 (mL/min)    GFR calc Af Amer >90  >90 (mL/min)   LIPASE, BLOOD     Status: Normal   Collection Time   02/24/12  8:58 AM      Component Value Range Comment   Lipase 18  11 - 59 (U/L)   URINALYSIS, ROUTINE W REFLEX MICROSCOPIC     Status: Abnormal   Collection Time   02/24/12 10:20 AM      Component Value Range Comment   Color, Urine YELLOW  YELLOW     APPearance CLEAR  CLEAR     Specific Gravity, Urine 1.025  1.005 - 1.030     pH 6.5  5.0 - 8.0     Glucose, UA NEGATIVE  NEGATIVE (mg/dL)     Hgb urine dipstick TRACE (*) NEGATIVE     Bilirubin Urine MODERATE (*) NEGATIVE     Ketones, ur TRACE (*) NEGATIVE (mg/dL)    Protein, ur 045 (*) NEGATIVE (mg/dL)    Urobilinogen, UA 1.0  0.0 - 1.0 (mg/dL)    Nitrite NEGATIVE  NEGATIVE     Leukocytes, UA NEGATIVE  NEGATIVE    URINE MICROSCOPIC-ADD ON     Status: Normal   Collection Time   02/24/12 10:20 AM      Component Value Range Comment   RBC / HPF 0-2  <3 (RBC/hpf)   BASIC METABOLIC PANEL     Status: Abnormal   Collection Time   02/25/12  5:49 AM      Component Value Range Comment   Sodium 139  135 - 145 (mEq/L)    Potassium 4.0  3.5 - 5.1 (mEq/L)    Chloride 101  96 - 112 (mEq/L)    CO2 28  19 - 32 (mEq/L)    Glucose, Bld 138 (*) 70 - 99 (mg/dL)    BUN 19  6 - 23 (mg/dL)    Creatinine, Ser 4.09  0.50 - 1.35 (mg/dL)    Calcium 9.7  8.4 - 10.5 (mg/dL)    GFR calc non Af Amer >90  >90 (mL/min)    GFR calc Af Amer >90  >90 (mL/min)   CBC     Status: Abnormal   Collection Time   02/25/12  5:49 AM      Component Value Range Comment   WBC 12.2 (*) 4.0 - 10.5 (K/uL)    RBC 5.38  4.22 - 5.81 (MIL/uL)    Hemoglobin 16.6  13.0 - 17.0 (g/dL)    HCT 81.1  91.4 - 78.2 (%)    MCV 90.5  78.0 -  100.0 (fL)    MCH 30.9  26.0 - 34.0 (pg)    MCHC 34.1  30.0 - 36.0 (g/dL)    RDW 16.1  09.6 - 04.5 (%)    Platelets 266  150 - 400 (K/uL)    Ct Abdomen Pelvis W Contrast  02/24/2012  *RADIOLOGY REPORT*  Clinical Data: Abdominal pain, nausea and vomiting.  History of prostate cancer.  CT ABDOMEN AND PELVIS WITH CONTRAST  Technique:  Multidetector CT imaging of the abdomen and pelvis was performed following the standard protocol during bolus administration of intravenous contrast.  Contrast: OMNIPAQUE IOHEXOL 300 MG/ML  SOLN CT  Comparison: CT abdomen and pelvis 02/10/2009.  Findings: Bibasilar atelectatic change is noted.  No pleural or pericardial effusion.  Heart size is upper normal.  The patient has a small bowel obstruction.  Small bowel  loops are dilated up to 4.1 cm.  No pneumatosis, free intraperitoneal air or portal venous gas is identified.  Transition point appears to be in the right lower quadrant.  The patient does have a small right inguinal hernia containing a knuckle of small bowel but this does not appear to be the transition point.  A single small low attenuation lesion in the right hepatic lobe is unchanged.  The liver is otherwise unremarkable.  The gallbladder, adrenal glands, pancreas and spleen appear normal.  There is colonic diverticulosis without diverticulitis. Fat containing umbilical hernia is noted.  No focal bony abnormality.  IMPRESSION:  1.  Study is positive for small bowel obstruction.  Obstruction appears to be due to adhesions in the right lower quadrant.  There is a small right inguinal hernia containing a knuckle of small bowel but this does not appear to be the cause of patient's obstruction. 2.  No change in bilateral renal cysts and a single small hepatic cyst. 3.  Diverticulosis without diverticulitis.  Original Report Authenticated By: Bernadene Bell. Maricela Curet, M.D.    Review of Systems  Constitutional: Negative for fever, chills and weight loss.  HENT: Negative.   Eyes: Negative.   Respiratory: Negative.   Cardiovascular: Negative.   Gastrointestinal: Positive for heartburn, nausea, vomiting and abdominal pain (diffuse colicky). Negative for diarrhea, constipation, blood in stool and melena.  Genitourinary: Negative.   Musculoskeletal: Negative.   Skin: Negative.   Neurological: Negative.   Endo/Heme/Allergies: Negative.   Psychiatric/Behavioral: Negative.     Blood pressure 152/89, pulse 99, temperature 97.7 F (36.5 C), temperature source Oral, resp. rate 18, height 6\' 2"  (1.88 m), weight 82.8 kg (182 lb 8.7 oz), SpO2 95.00%. Physical Exam  Constitutional: He is oriented to person, place, and time. He appears well-developed and well-nourished. No distress.       Uncomfortable  HENT:  Head:  Normocephalic and atraumatic.  Eyes: Conjunctivae and EOM are normal. Pupils are equal, round, and reactive to light. No scleral icterus.  Neck: Normal range of motion. Neck supple. No tracheal deviation present. No thyromegaly present.  Cardiovascular: Normal rate, regular rhythm and normal heart sounds.   Respiratory: Effort normal and breath sounds normal. No respiratory distress.  GI: Soft. He exhibits distension (significant distention). He exhibits no mass. There is tenderness (moderate diffuse tenderness no peritoneal signs). There is no rebound and no guarding.  Musculoskeletal: Normal range of motion.  Lymphadenopathy:    He has no cervical adenopathy.  Neurological: He is alert and oriented to person, place, and time.  Skin: Skin is warm and dry.     Assessment/Plan  small bowel obstruction. Based  on patient's history this is likely due to adhesions however I did discuss neoplastic etiologies as well. At this time patient will be matted for IV fluid hydration. Continued bowel rest. Indications to proceed emergently to the operating room were discussed with the patient as well as indications should his bowels which and failed to resolve. I also did discuss at length with the patient placement of a nasogastric tube. Currently he is refusing placement however I did discuss and encourage him to consider placement should she continue to have nausea and vomiting. A she is aware plan is comfortable with our discussion.  Keri Tavella C 02/25/2012, 10:18 AM

## 2012-02-26 ENCOUNTER — Inpatient Hospital Stay (HOSPITAL_COMMUNITY): Payer: Medicare Other

## 2012-02-26 LAB — CBC
HCT: 45.5 % (ref 39.0–52.0)
Hemoglobin: 15 g/dL (ref 13.0–17.0)
MCHC: 33 g/dL (ref 30.0–36.0)
RBC: 4.89 MIL/uL (ref 4.22–5.81)
WBC: 12.3 10*3/uL — ABNORMAL HIGH (ref 4.0–10.5)

## 2012-02-26 LAB — BASIC METABOLIC PANEL
BUN: 35 mg/dL — ABNORMAL HIGH (ref 6–23)
Chloride: 103 mEq/L (ref 96–112)
GFR calc Af Amer: 20 mL/min — ABNORMAL LOW (ref >90)
GFR calc non Af Amer: 17 mL/min — ABNORMAL LOW (ref >90)
Potassium: 4.6 mEq/L (ref 3.5–5.1)
Sodium: 140 mEq/L (ref 135–145)

## 2012-02-26 MED ORDER — LACTATED RINGERS IV BOLUS (SEPSIS): 1000.0000 mL | Freq: Once | INTRAVENOUS | Status: DC

## 2012-02-26 MED ORDER — IPRATROPIUM BROMIDE 0.02 % IN SOLN
0.5000 mg | RESPIRATORY_TRACT | Status: DC | PRN
  Administered 2012-02-26 (×2): 0.5 mg via RESPIRATORY_TRACT
  Filled 2012-02-26: qty 2.5

## 2012-02-26 MED ORDER — ALBUTEROL SULFATE (5 MG/ML) 0.5% IN NEBU
2.5000 mg | INHALATION_SOLUTION | RESPIRATORY_TRACT | Status: DC
  Administered 2012-02-26 – 2012-02-28 (×4): 2.5 mg via RESPIRATORY_TRACT
  Filled 2012-02-26 (×2): qty 0.5

## 2012-02-26 MED ORDER — ALBUTEROL SULFATE (5 MG/ML) 0.5% IN NEBU
2.5000 mg | INHALATION_SOLUTION | RESPIRATORY_TRACT | Status: DC | PRN
  Administered 2012-02-28 – 2012-02-29 (×2): 2.5 mg via RESPIRATORY_TRACT
  Filled 2012-02-26 (×2): qty 0.5

## 2012-02-26 MED ORDER — LACTATED RINGERS IV BOLUS (SEPSIS)
1000.0000 mL | Freq: Once | INTRAVENOUS | Status: AC
  Administered 2012-02-26: 1000 mL via INTRAVENOUS

## 2012-02-26 MED ORDER — IPRATROPIUM BROMIDE 0.02 % IN SOLN
0.5000 mg | RESPIRATORY_TRACT | Status: DC
  Administered 2012-02-26 – 2012-02-28 (×4): 0.5 mg via RESPIRATORY_TRACT
  Filled 2012-02-26 (×2): qty 2.5

## 2012-02-26 MED ORDER — ENOXAPARIN SODIUM 30 MG/0.3ML ~~LOC~~ SOLN
30.0000 mg | SUBCUTANEOUS | Status: DC
  Administered 2012-02-26 – 2012-02-28 (×2): 30 mg via SUBCUTANEOUS
  Filled 2012-02-26 (×2): qty 0.3

## 2012-02-26 MED ORDER — LORAZEPAM 2 MG/ML IJ SOLN
0.5000 mg | INTRAMUSCULAR | Status: DC | PRN
  Administered 2012-02-26: 1 mg via INTRAVENOUS
  Filled 2012-02-26: qty 1

## 2012-02-26 MED ORDER — FUROSEMIDE 10 MG/ML IJ SOLN
20.0000 mg | Freq: Once | INTRAMUSCULAR | Status: AC
  Administered 2012-02-26: 20 mg via INTRAVENOUS
  Filled 2012-02-26: qty 2

## 2012-02-26 MED ORDER — LACTATED RINGERS IV SOLN
INTRAVENOUS | Status: DC
  Administered 2012-02-27: 1000 mL via INTRAVENOUS
  Administered 2012-02-27 – 2012-02-29 (×4): via INTRAVENOUS

## 2012-02-26 MED ORDER — NICOTINE 14 MG/24HR TD PT24
14.0000 mg | MEDICATED_PATCH | Freq: Every day | TRANSDERMAL | Status: DC
  Administered 2012-02-26 – 2012-02-29 (×4): 14 mg via TRANSDERMAL
  Filled 2012-02-26 (×4): qty 1

## 2012-02-26 MED ORDER — IPRATROPIUM BROMIDE 0.02 % IN SOLN
RESPIRATORY_TRACT | Status: AC
  Filled 2012-02-26: qty 2.5

## 2012-02-26 MED ORDER — CHLORHEXIDINE GLUCONATE 0.12 % MT SOLN
15.0000 mL | Freq: Two times a day (BID) | OROMUCOSAL | Status: DC
  Administered 2012-02-26 – 2012-02-29 (×5): 15 mL via OROMUCOSAL
  Filled 2012-02-26 (×5): qty 15

## 2012-02-26 MED ORDER — SODIUM CHLORIDE 0.9 % IV BOLUS (SEPSIS)
1000.0000 mL | Freq: Once | INTRAVENOUS | Status: AC
  Administered 2012-02-26: 1000 mL via INTRAVENOUS

## 2012-02-26 NOTE — Addendum Note (Signed)
Addendum  created 02/26/12 1323 by Despina Hidden, CRNA   Modules edited:Notes Section

## 2012-02-26 NOTE — Progress Notes (Signed)
1 Day Post-Op  Subjective: Abdominal pain which is mostly controlled with PCA. He does have some shortness of breath but states that he is feeling slightly improved now. He denies any nausea. No fevers or chills.  Objective: Vital signs in last 24 hours: Temp:  [97.6 F (36.4 C)-99.5 F (37.5 C)] 98.1 F (36.7 C) (05/11 1200) Pulse Rate:  [82-139] 133  (05/11 1200) Resp:  [20-37] 29  (05/11 1200) BP: (71-148)/(53-89) 89/67 mmHg (05/11 1200) SpO2:  [69 %-99 %] 95 % (05/11 1200) FiO2 (%):  [0.4 %-50 %] 50 % (05/11 1331) Weight:  [90.2 kg (198 lb 13.7 oz)] 90.2 kg (198 lb 13.7 oz) (05/11 0400) Last BM Date: 02/23/12  Intake/Output from previous day: 05/10 0701 - 05/11 0700 In: 3930 [I.V.:3920] Out: 1205 [Urine:350; Drains:5; Blood:150] Intake/Output this shift: Total I/O In: 1175.8 [I.V.:1175.8] Out: 750 [Emesis/NG output:750]  General appearance: alert and no distress Eyes: Pupils equal round reactive extraocular movements are intact. Resp: Decreased sounds at bilateral bases. No crackles apparent. Cardio: Tachycardia with regular rhythm GI: No bowel sounds, soft, mild to moderate distention. Moderate abdominal tenderness as expected. Incision is clean dry and intact. JP demonstrates serous sanguinous discharge with minimal drainage within the bulb. Incision dressing is intact.  Lab Results:   Basename 02/26/12 0520 02/25/12 0549  WBC 12.3* 12.2*  HGB 15.0 16.6  HCT 45.5 48.7  PLT 180 266   BMET  Basename 02/26/12 0520 02/25/12 0549  NA 140 139  K 4.6 4.0  CL 103 101  CO2 24 28  GLUCOSE 91 138*  BUN 35* 19  CREATININE 3.46* 0.80  CALCIUM 8.2* 9.7   PT/INR No results found for this basename: LABPROT:2,INR:2 in the last 72 hours ABG No results found for this basename: PHART:2,PCO2:2,PO2:2,HCO3:2 in the last 72 hours  Studies/Results: Dg Chest Port 1 View  02/26/2012  *RADIOLOGY REPORT*  Clinical Data: Shortness of breath, dyspnea  PORTABLE CHEST - 1 VIEW   Comparison: 10/07/2004  Findings: Cardiomegaly again noted.  The study is limited by poor inspiration.  There is bilateral basilar atelectasis or infiltrate right greater than left.  No pulmonary edema.  NG tube in place is noted.  The tip of the NG tube is not included on the film.  IMPRESSION: Limited study by poor inspiration.  Bilateral basilar atelectasis or infiltrate right greater than left.  No pulmonary edema.  Original Report Authenticated By: Natasha Mead, M.D.    Anti-infectives: Anti-infectives     Start     Dose/Rate Route Frequency Ordered Stop   02/25/12 1109   dextrose 5 % with cefOXitin (MEFOXIN) ADS Med     Comments: HOLLIE, CHRISTINA: cabinet override         02/25/12 1109 02/25/12 2314   02/25/12 1100   cefOXitin (MEFOXIN) 1 g in dextrose 5 % 50 mL IVPB  Status:  Discontinued        1 g 100 mL/hr over 30 Minutes Intravenous 60 min pre-op 02/25/12 1100 02/25/12 1542          Assessment/Plan: s/p Procedure(s) (LRB): EXPLORATORY LAPAROTOMY (N/A) SMALL BOWEL RESECTION () Pulmonary: Continue to encourage incentive spirometry use. Chest x-ray from this morning looks okay with no evidence of pulmonary edema. There is some suspicion of atelectasis and I encouraged patient to continue to cough, deep breathing, use the incentive parameter. Tinea breathing treatments. Cardiovascular: Blood pressure remains marginal. I do feel patient is still inadequately resuscitated and would benefit from additional fluid. I do suspect significant  third spacing especially into the abdomen as well as into the GI lumen. Gastrointestinal: Continue nasogastric tube for now. Await bowel function increase. Genitourinary: Anuric.  Creatinine has increased. I did get and I suspect the patient is hypovolemic especially given his heme concentration. With patient's COPD I'm hesitant to be overly aggressive with IV fluid resuscitation but at this time I still feel patient requires additional fluid resuscitation.  I also discussed with the patient the possibility of a bladder injury however at this time there is no evidence of any intra-abdominal leakage from the bladder. I will continue to monitor for this however. Additional I discussed with the patient should his renal function continued to worsen I will obtain renal consultation tomorrow pending his function. ID: Continue to monitor for evidence of intra-abdominal infection. FEN: Continue n.p.o. status for now. A lateral extra okay. General: Continue to monitor in intensive care unit as a step down patient.  LOS: 2 days    Arjay Jaskiewicz C 02/26/2012

## 2012-02-26 NOTE — Anesthesia Postprocedure Evaluation (Signed)
  Anesthesia Post-op Note  Patient: Michael Ellison  Procedure(s) Performed: Procedure(s) (LRB): EXPLORATORY LAPAROTOMY (N/A) SMALL BOWEL RESECTION ()  Patient Location: ICU 9  Anesthesia Type: General  Level of Consciousness: awake, alert , oriented and patient cooperative  Airway and Oxygen Therapy: Patient Spontanous Breathing and Patient connected to nasal cannula oxygen  Post-op Pain: 5 /10, moderate  Post-op Assessment: Post-op Vital signs reviewed, Patient's Cardiovascular Status Stable, PATIENT'S CARDIOVASCULAR STATUS UNSTABLE, Patent Airway, No signs of Nausea or vomiting and Pain level controlled  Post-op Vital Signs: Reviewed and stable  Complications: No apparent anesthesia complications

## 2012-02-27 MED ORDER — HALOPERIDOL LACTATE 5 MG/ML IJ SOLN
5.0000 mg | Freq: Once | INTRAMUSCULAR | Status: AC
  Administered 2012-02-27: 5 mg via INTRAMUSCULAR
  Filled 2012-02-27 (×2): qty 1

## 2012-02-27 MED ORDER — HALOPERIDOL LACTATE 5 MG/ML IJ SOLN
10.0000 mg | Freq: Four times a day (QID) | INTRAMUSCULAR | Status: DC | PRN
  Administered 2012-02-27: 10 mg via INTRAVENOUS
  Administered 2012-02-27: 5 mg via INTRAVENOUS
  Administered 2012-02-28 (×4): 10 mg via INTRAVENOUS
  Filled 2012-02-27 (×3): qty 2
  Filled 2012-02-27 (×2): qty 1
  Filled 2012-02-27 (×2): qty 2

## 2012-02-27 MED ORDER — SODIUM CHLORIDE 0.9 % IV SOLN
0.7000 ug/kg/h | INTRAVENOUS | Status: DC
  Administered 2012-02-27 – 2012-02-28 (×3): 0.7 ug/kg/h via INTRAVENOUS
  Administered 2012-02-28: 0.5 ug/kg/h via INTRAVENOUS
  Administered 2012-02-28: 0.7 ug/kg/h via INTRAVENOUS
  Administered 2012-02-29 (×2): 0.5 ug/kg/h via INTRAVENOUS
  Filled 2012-02-27 (×9): qty 4

## 2012-02-27 MED ORDER — HALOPERIDOL LACTATE 5 MG/ML IJ SOLN
5.0000 mg | Freq: Once | INTRAMUSCULAR | Status: DC
  Filled 2012-02-27 (×2): qty 1

## 2012-02-27 MED ORDER — HALOPERIDOL LACTATE 5 MG/ML IJ SOLN
INTRAMUSCULAR | Status: AC
  Administered 2012-02-27: 5 mg
  Filled 2012-02-27: qty 1

## 2012-02-27 MED ORDER — METOPROLOL TARTRATE 1 MG/ML IV SOLN: 5.0000 mg | Freq: Once | INTRAVENOUS | Status: AC

## 2012-02-27 MED ORDER — SODIUM CHLORIDE 0.9 % IJ SOLN
INTRAMUSCULAR | Status: AC
  Filled 2012-02-27: qty 3

## 2012-02-27 MED ORDER — HALOPERIDOL LACTATE 5 MG/ML IJ SOLN
5.0000 mg | Freq: Once | INTRAMUSCULAR | Status: AC
  Administered 2012-02-27: 5 mg via INTRAVENOUS

## 2012-02-27 MED ORDER — SODIUM CHLORIDE 0.9 % IV BOLUS (SEPSIS)
1000.0000 mL | Freq: Once | INTRAVENOUS | Status: AC
  Administered 2012-02-27: 1000 mL via INTRAVENOUS

## 2012-02-27 NOTE — Progress Notes (Signed)
Pt remains slightly agitated upon this nurses arrival to room. Pt did allow nurse to place a new iv, replace heart monitor and O2 sat. monitor, and restart IVF and PCA. Abdominal dsg intact, with small amount of dry, bloody drainage on distal end. JP drain is displaced, still attached to pt, but inner part of drain is exposed. Pt removed NGT during the night. Per Dr Leticia Penna it should not be replaced unless pt starts complaining of nausea and vomiting. Pt is tolerating ice chips at this time.

## 2012-02-27 NOTE — Progress Notes (Signed)
Pt continues to be very confused, agitated and combative. Has attemped to get up unassisted several times, hitting staff and family.  md notitfed and new orders recieved

## 2012-02-27 NOTE — Progress Notes (Signed)
2 Days Post-Op  Subjective: Patient aggitated.  No complaint of pain.  "I'm going home". "I want to be at home and you can't keep me here."  No nausea.  Thirsty.  No flatus.  "Even my family is against me."  Objective: Vital signs in last 24 hours: Temp:  [97.7 F (36.5 C)-98.3 F (36.8 C)] 97.7 F (36.5 C) (05/12 0800) Pulse Rate:  [65-131] 116  (05/12 1400) Resp:  [26-38] 28  (05/12 1400) BP: (80-145)/(58-95) 90/58 mmHg (05/12 1400) SpO2:  [89 %-99 %] 94 % (05/12 1520) FiO2 (%):  [40 %] 40 % (05/11 2000) Last BM Date: 02/23/12  Intake/Output from previous day: 05/11 0701 - 05/12 0700 In: 4575.8 [I.V.:3575.8; IV Piggyback:1000] Out: 3855 [Urine:2200; Emesis/NG output:1650; Drains:5] Intake/Output this shift: Total I/O In: 1137.4 [I.V.:1137.4] Out: -   General appearance: combative and uncooperative Eyes: PERR, EOMI Resp: clear to auscultation bilaterally Cardio: tachy, reg rhythm GI: Quiet, soft, mild tenderness, distended.  No peritoneal signs.  Incision c/d/i.  JP pulled to skin level. Extremities: no edema, redness or tenderness in the calves or thighs  Lab Results:   Basename 02/26/12 0520 02/25/12 0549  WBC 12.3* 12.2*  HGB 15.0 16.6  HCT 45.5 48.7  PLT 180 266   BMET  Basename 02/26/12 0520 02/25/12 0549  NA 140 139  K 4.6 4.0  CL 103 101  CO2 24 28  GLUCOSE 91 138*  BUN 35* 19  CREATININE 3.46* 0.80  CALCIUM 8.2* 9.7   PT/INR No results found for this basename: LABPROT:2,INR:2 in the last 72 hours ABG No results found for this basename: PHART:2,PCO2:2,PO2:2,HCO3:2 in the last 72 hours  Studies/Results: Dg Chest Port 1 View  02/26/2012  *RADIOLOGY REPORT*  Clinical Data: Shortness of breath, dyspnea  PORTABLE CHEST - 1 VIEW  Comparison: 10/07/2004  Findings: Cardiomegaly again noted.  The study is limited by poor inspiration.  There is bilateral basilar atelectasis or infiltrate right greater than left.  No pulmonary edema.  NG tube in place is  noted.  The tip of the NG tube is not included on the film.  IMPRESSION: Limited study by poor inspiration.  Bilateral basilar atelectasis or infiltrate right greater than left.  No pulmonary edema.  Original Report Authenticated By: Natasha Mead, M.D.    Anti-infectives: Anti-infectives     Start     Dose/Rate Route Frequency Ordered Stop   02/25/12 1109   dextrose 5 % with cefOXitin (MEFOXIN) ADS Med     Comments: HOLLIE, CHRISTINA: cabinet override         02/25/12 1109 02/25/12 2314   02/25/12 1100   cefOXitin (MEFOXIN) 1 g in dextrose 5 % 50 mL IVPB  Status:  Discontinued        1 g 100 mL/hr over 30 Minutes Intravenous 60 min pre-op 02/25/12 1100 02/25/12 1542          Assessment/Plan: s/p Procedure(s) (LRB): EXPLORATORY LAPAROTOMY (N/A) SMALL BOWEL RESECTION () Overall patient clinically appears stable.  He is combative and I spent nearly attempting to help calm patient.  Security and [employer] present.  Restraints were removed in an attempt to lessen the patients aggression but to limited avail.  Patient at risk to him self.  IV and Foley have been maintained but NG, prior IV and JP all pulled out by patient.  On further discussion with patient's daughters, he apparently does drink heavily.  Patient now admits to drinking a whole "bottle" of liquor before presenting to the  ED.  Patient somewhat less combative with Haldol but still aggressive.  Will try presidex gtt to attempt further sedation short of intubating patient.  Should patient continue to be a risk to himself, this may be required.  Pulmoonay:  appears stable although difficult to assess due to patient's continual removal of face mask and pulse ox.  Cardio:  remains tachycardic but I feel this is secondary to aggitation.  GI:  will leave NG out for now to minimize additional stimulation to patient.  Awaiting bowel function.  GU:  UOP has increased however renal fxn cannot be completely evaluated due to inability to obtain labs  from the patient safely.  ID:  [email or web address].  ? WBC count today.  No overt evidence for infectious process.  GEN:  Suspect DT in addition to post-operative delerium.  Continue coverage.  Continue step down.  LOS: 3 days    Lynnetta Tom C 02/27/2012

## 2012-02-27 NOTE — Progress Notes (Signed)
Pt talking to male rt and is calmer at this time. Daughters requested to hold off giving him any more medicine at this time.  Will conitinue to monitor

## 2012-02-27 NOTE — Progress Notes (Signed)
Administered prn ativan to pt at 2359 after discussing need to rest.  Pt slept approx 30 min. And woke up confused. Easily reoriented and pt slept again . Woke up again and this undersigned nurse sat in room with pt until he went to sleep again.  Pt woke again  And had pulled out ngt and taken off o2.  Pt very confused and paraniod accusing staff of kidnapping him. Pt pulled off monitor leads and refused to let staff replace.  Asked pt if he would like for family to be notified. Pt agreed to have family come to hospital. Telephoned daughter and she agreed to come to hospital.

## 2012-02-27 NOTE — Progress Notes (Signed)
MD notified of drop in BP (SBP 70's), NS bolus ordered. Given with minimal elevation in BP. BP stable with SBP in the 80's at this time.

## 2012-02-27 NOTE — Progress Notes (Signed)
Dr Leticia Penna notified by phone that pt's JP drain is displaced, and also that he refused a.m lab draw. Pt increasingly agitated again. HR 138 BP 106/73 RR 33 O2 sat 87% on 6l [place]. Placed on 50% venturimask. Pt frequently removing mask, and very confused. Green safety mitts placed. O2 sat 95% on ventimask. Haldol 5mg  IM given.

## 2012-02-27 NOTE — Progress Notes (Signed)
Pt placed on precidex gtt at 1220, after family stated when questioned that pt is a heavy etoh drinker. Bolus of given, and then gtt initiated at maximum of 0.7 mcg/kg/hour. Pt continued to act agitated and combatitive with staff, and family refusing O2, despite sats of 60-70%. Fentanyl 50 mcg iv given at 1333, and haldol 5mg  given at 1345. Pt is now sedated, and tolerating NRB mask.

## 2012-02-27 NOTE — Progress Notes (Signed)
Pt becoming more agitated. IM haldol given per order

## 2012-02-27 NOTE — Progress Notes (Signed)
Pt increasingly combatative, requiring multiple staff and security to keep pt from removing oxygen mask. Pt was hitting,  Kneeing, and verbally abusing staff. Dr Leticia Penna notified. Second dose of haldol given, as ordered. NRB mask placed due to O2 sat in 80s.

## 2012-02-28 ENCOUNTER — Inpatient Hospital Stay (HOSPITAL_COMMUNITY): Payer: Medicare Other

## 2012-02-28 ENCOUNTER — Encounter (HOSPITAL_COMMUNITY): Payer: Self-pay | Admitting: General Surgery

## 2012-02-28 LAB — BASIC METABOLIC PANEL
BUN: 46 mg/dL — ABNORMAL HIGH (ref 6–23)
CO2: 23 mEq/L (ref 19–32)
Chloride: 108 mEq/L (ref 96–112)
Creatinine, Ser: 1.24 mg/dL (ref 0.50–1.35)
Glucose, Bld: 79 mg/dL (ref 70–99)
Potassium: 3.3 mEq/L — ABNORMAL LOW (ref 3.5–5.1)

## 2012-02-28 LAB — CBC
HCT: 38.3 % — ABNORMAL LOW (ref 39.0–52.0)
Hemoglobin: 12.8 g/dL — ABNORMAL LOW (ref 13.0–17.0)
MCH: 30 pg (ref 26.0–34.0)
MCHC: 33.4 g/dL (ref 30.0–36.0)
MCV: 89.9 fL (ref 78.0–100.0)
RDW: 14.5 % (ref 11.5–15.5)

## 2012-02-28 MED ORDER — PIPERACILLIN-TAZOBACTAM 3.375 G IVPB
3.3750 g | Freq: Four times a day (QID) | INTRAVENOUS | Status: DC
  Filled 2012-02-28 (×4): qty 50

## 2012-02-28 MED ORDER — SODIUM CHLORIDE 0.9 % IJ SOLN
INTRAMUSCULAR | Status: AC
  Administered 2012-02-28: 10 mL
  Filled 2012-02-28: qty 3

## 2012-02-28 MED ORDER — ALBUTEROL SULFATE (5 MG/ML) 0.5% IN NEBU
2.5000 mg | INHALATION_SOLUTION | Freq: Three times a day (TID) | RESPIRATORY_TRACT | Status: DC
  Administered 2012-02-28 – 2012-02-29 (×3): 2.5 mg via RESPIRATORY_TRACT
  Filled 2012-02-28 (×3): qty 0.5

## 2012-02-28 MED ORDER — DEXMEDETOMIDINE HCL 100 MCG/ML IV SOLN
INTRAVENOUS | Status: AC
  Filled 2012-02-28: qty 4

## 2012-02-28 MED ORDER — PIPERACILLIN-TAZOBACTAM 3.375 G IVPB
3.3750 g | Freq: Three times a day (TID) | INTRAVENOUS | Status: DC
  Administered 2012-02-28 – 2012-03-14 (×45): 3.375 g via INTRAVENOUS
  Filled 2012-02-28 (×50): qty 50

## 2012-02-28 MED ORDER — IPRATROPIUM BROMIDE 0.02 % IN SOLN
0.5000 mg | Freq: Three times a day (TID) | RESPIRATORY_TRACT | Status: DC
  Administered 2012-02-28 – 2012-02-29 (×3): 0.5 mg via RESPIRATORY_TRACT
  Filled 2012-02-28 (×3): qty 2.5

## 2012-02-28 NOTE — Progress Notes (Signed)
3 Days Post-Op  Subjective: Patient has reportedly had a bowel movement. He is still somewhat agitated and mildly confused. He is aware of his environment and surrounding family. Denies any pain. No nausea.  Objective: Vital signs in last 24 hours: Temp:  [97.8 F (36.6 C)-98.4 F (36.9 C)] 98.4 F (36.9 C) (05/13 0800) Pulse Rate:  [67-120] 97  (05/13 1200) Resp:  [14-38] 27  (05/13 1200) BP: (72-131)/(48-96) 111/71 mmHg (05/13 1200) SpO2:  [86 %-99 %] 92 % (05/13 1200) Weight:  [90.9 kg (200 lb 6.4 oz)] 90.9 kg (200 lb 6.4 oz) (05/13 0500) Last BM Date: 02/23/12  Intake/Output from previous day: 05/12 0701 - 05/13 0700 In: 3170.7 [I.V.:3170.7] Out: 1350 [Urine:1350] Intake/Output this shift: Total I/O In: 779 [P.O.:100; I.V.:679] Out: -   General appearance: no distress, slowed mentation and Irritable Resp: diminished breath sounds at bilateral bases. Coarse upper airway sounds. Unlabored respirations GI: Soft, mild distention, mild to moderate expected tenderness. Incision is clean dry and intact. Expected tenderness. No peritoneal signs.  Lab Results:   Basename 02/28/12 0505 02/26/12 0520  WBC 9.4 12.3*  HGB 12.8* 15.0  HCT 38.3* 45.5  PLT 149* 180   BMET  Basename 02/28/12 0505 02/26/12 0520  NA 143 140  K 3.3* 4.6  CL 108 103  CO2 23 24  GLUCOSE 79 91  BUN 46* 35*  CREATININE 1.24 3.46*  CALCIUM 7.8* 8.2*   PT/INR No results found for this basename: LABPROT:2,INR:2 in the last 72 hours ABG No results found for this basename: PHART:2,PCO2:2,PO2:2,HCO3:2 in the last 72 hours  Studies/Results: No results found.  Anti-infectives: Anti-infectives     Start     Dose/Rate Route Frequency Ordered Stop   02/25/12 1109   dextrose 5 % with cefOXitin (MEFOXIN) ADS Med     Comments: HOLLIE, CHRISTINA: cabinet override         02/25/12 1109 02/25/12 2314   02/25/12 1100   cefOXitin (MEFOXIN) 1 g in dextrose 5 % 50 mL IVPB  Status:  Discontinued        1  g 100 mL/hr over 30 Minutes Intravenous 60 min pre-op 02/25/12 1100 02/25/12 1542          Assessment/Plan: s/p Procedure(s) (LRB): EXPLORATORY LAPAROTOMY (N/A) SMALL BOWEL RESECTION () Overall patient appears more comfortable today. He is definitely less irritable. He is not combative today. He does remain on the Precedex drip for his suspected alcohol withdrawal. He does remember some of the events from yesterday but is still obviously confused. He has had some difficulty understanding utilization of the incision as per hour her pain has been encouraged to do deep breathing and coughing. I do suspect some of his desaturations are likely secondarily to atelectasis. I encouraged increased coughing and deep breathing. Patient is less sedated on the Precedex drip hopefully patient can be increased on his mobility. I will check a chest x-ray to ensure no development of any infiltrates over the interim. Cardiovascular: Blood pressure is more stable. Heart rate has remained tachycardic but is improved. Gastrointestinal: He has increased bowel function. We'll initiate limited clears for now. Genitourinary: Renal function has improved significantly. Urine output has improved significantly. General: Continue to decrease sedation as per minute. Patient will remain on CIWA scale to control his withdrawal symptoms. Continue step down monitoring for now.  LOS: 4 days    Michael Ellison C 02/28/2012

## 2012-02-28 NOTE — Progress Notes (Signed)
Correction on my previous note: PT uses V/A as needed at home. His atelectasis isnt a lot better but he has a TID and a PRN if he needs more

## 2012-02-28 NOTE — Progress Notes (Signed)
Zosyn 3.75 GM IV every 6 hours has been changed to Zosyn 3.375 GM IV every 8 hours per P&T policy.

## 2012-02-28 NOTE — Progress Notes (Signed)
MD paged at 1440 and made aware of portable chest x-ray results.   At 1445 MD called back. Read results back of portable chest x-ray. MD made aware that pt's oxygen has increased to 6 liters and at times veni mask. HR also increased to 120's. New orders received to start Zosyn IV q 6hrs.

## 2012-02-28 NOTE — Progress Notes (Addendum)
Nursing supervisor was in to speak to family and family very concerned about patients progress. Pt's family questioning about transferring to another facility and his progress especially concerning his respiratory status. Paged Dr. Leticia Penna about family concerns. Dr. Leticia Penna spoke to pt daughter on phone for about 15 minutes. Family agrees to keeping patient here but wanting more done with pulmonary toilet. Concerns noted and pt now to be woken every hour for IS/deep breathing/coughing. This am upon assessment pt unable to comprehend how to use IS, however patient able to cough and deep breath. Throughout shift have encouraged pt to cough and deep breath while pt awake. Pt able to cough and produce yellowish thick sputum. Pt did sit up in chair for 2 hours today. Pt returned to bed, however now sitting up in chair again. Will continue to monitor.

## 2012-02-29 ENCOUNTER — Inpatient Hospital Stay (HOSPITAL_COMMUNITY): Payer: Medicare Other

## 2012-02-29 ENCOUNTER — Encounter (HOSPITAL_COMMUNITY): Payer: Self-pay | Admitting: Pulmonary Disease

## 2012-02-29 DIAGNOSIS — A419 Sepsis, unspecified organism: Secondary | ICD-10-CM

## 2012-02-29 DIAGNOSIS — J9589 Other postprocedural complications and disorders of respiratory system, not elsewhere classified: Secondary | ICD-10-CM

## 2012-02-29 DIAGNOSIS — J690 Pneumonitis due to inhalation of food and vomit: Secondary | ICD-10-CM

## 2012-02-29 DIAGNOSIS — J9600 Acute respiratory failure, unspecified whether with hypoxia or hypercapnia: Secondary | ICD-10-CM

## 2012-02-29 DIAGNOSIS — K56609 Unspecified intestinal obstruction, unspecified as to partial versus complete obstruction: Secondary | ICD-10-CM

## 2012-02-29 DIAGNOSIS — R6520 Severe sepsis without septic shock: Secondary | ICD-10-CM

## 2012-02-29 DIAGNOSIS — G9340 Encephalopathy, unspecified: Secondary | ICD-10-CM

## 2012-02-29 LAB — URINE MICROSCOPIC-ADD ON

## 2012-02-29 LAB — POCT I-STAT 3, ART BLOOD GAS (G3+)
Acid-base deficit: 3 mmol/L — ABNORMAL HIGH (ref 0.0–2.0)
Bicarbonate: 22.9 mEq/L (ref 20.0–24.0)
O2 Saturation: 96 %
TCO2: 24 mmol/L (ref 0–100)
pCO2 arterial: 42.3 mmHg (ref 35.0–45.0)
pO2, Arterial: 87 mmHg (ref 80.0–100.0)

## 2012-02-29 LAB — COMPREHENSIVE METABOLIC PANEL
ALT: 27 U/L (ref 0–53)
AST: 32 U/L (ref 0–37)
Alkaline Phosphatase: 45 U/L (ref 39–117)
CO2: 27 mEq/L (ref 19–32)
Chloride: 111 mEq/L (ref 96–112)
Creatinine, Ser: 0.94 mg/dL (ref 0.50–1.35)
GFR calc non Af Amer: 86 mL/min — ABNORMAL LOW (ref >90)
Potassium: 3.3 mEq/L — ABNORMAL LOW (ref 3.5–5.1)
Sodium: 147 mEq/L — ABNORMAL HIGH (ref 135–145)
Total Bilirubin: 0.7 mg/dL (ref 0.3–1.2)

## 2012-02-29 LAB — URINALYSIS, ROUTINE W REFLEX MICROSCOPIC
Glucose, UA: 100 mg/dL — AB
Ketones, ur: 15 mg/dL — AB
Protein, ur: 100 mg/dL — AB

## 2012-02-29 LAB — BLOOD GAS, ARTERIAL
Acid-Base Excess: 2.7 mmol/L — ABNORMAL HIGH (ref 0.0–2.0)
Bicarbonate: 26.3 mEq/L — ABNORMAL HIGH (ref 20.0–24.0)
FIO2: 50 %
O2 Saturation: 90.3 %
pO2, Arterial: 58.8 mmHg — ABNORMAL LOW (ref 80.0–100.0)

## 2012-02-29 LAB — BASIC METABOLIC PANEL
BUN: 30 mg/dL — ABNORMAL HIGH (ref 6–23)
Calcium: 8.6 mg/dL (ref 8.4–10.5)
Creatinine, Ser: 1.01 mg/dL (ref 0.50–1.35)
GFR calc Af Amer: 89 mL/min — ABNORMAL LOW (ref >90)
GFR calc non Af Amer: 77 mL/min — ABNORMAL LOW (ref >90)
Glucose, Bld: 114 mg/dL — ABNORMAL HIGH (ref 70–99)
Potassium: 3.4 mEq/L — ABNORMAL LOW (ref 3.5–5.1)

## 2012-02-29 LAB — CARBOXYHEMOGLOBIN
Carboxyhemoglobin: 1.6 % — ABNORMAL HIGH (ref 0.5–1.5)
Methemoglobin: 1.7 % — ABNORMAL HIGH (ref 0.0–1.5)
Methemoglobin: 1.4 % (ref 0.0–1.5)
O2 Saturation: 73.2 %
Total hemoglobin: 13.8 g/dL (ref 13.5–18.0)

## 2012-02-29 LAB — CBC
Hemoglobin: 13.9 g/dL (ref 13.0–17.0)
MCH: 30.4 pg (ref 26.0–34.0)
MCHC: 33.8 g/dL (ref 30.0–36.0)
MCV: 88.5 fL (ref 78.0–100.0)
Platelets: 122 10*3/uL — ABNORMAL LOW (ref 150–400)
RBC: 4.07 MIL/uL — ABNORMAL LOW (ref 4.22–5.81)
RDW: 14.5 % (ref 11.5–15.5)
WBC: 13.4 10*3/uL — ABNORMAL HIGH (ref 4.0–10.5)

## 2012-02-29 LAB — TYPE AND SCREEN
ABO/RH(D): A POS
Antibody Screen: NEGATIVE

## 2012-02-29 LAB — APTT: aPTT: 21 seconds — ABNORMAL LOW (ref 24–37)

## 2012-02-29 LAB — PROTIME-INR
INR: 1.16 (ref 0.00–1.49)
Prothrombin Time: 15 seconds (ref 11.6–15.2)

## 2012-02-29 MED ORDER — PROPOFOL 10 MG/ML IV EMUL
INTRAVENOUS | Status: AC
  Filled 2012-02-29: qty 100

## 2012-02-29 MED ORDER — SODIUM CHLORIDE 0.9 % IV BOLUS (SEPSIS)
25.0000 mL/kg | Freq: Once | INTRAVENOUS | Status: AC
  Administered 2012-02-29: 1000 mL via INTRAVENOUS

## 2012-02-29 MED ORDER — PANTOPRAZOLE SODIUM 40 MG IV SOLR
40.0000 mg | Freq: Every day | INTRAVENOUS | Status: DC
Start: 2012-02-29 — End: 2012-02-29

## 2012-02-29 MED ORDER — ETOMIDATE 2 MG/ML IV SOLN
INTRAVENOUS | Status: AC
  Administered 2012-02-29: 20 mg
  Filled 2012-02-29: qty 10

## 2012-02-29 MED ORDER — SODIUM CHLORIDE 0.9 % IV SOLN: 250.0000 mL | INTRAVENOUS | Status: DC | PRN

## 2012-02-29 MED ORDER — THIAMINE HCL 100 MG/ML IJ SOLN
100.0000 mg | Freq: Every day | INTRAMUSCULAR | Status: DC
  Administered 2012-02-29 – 2012-03-01 (×2): 100 mg via INTRAVENOUS
  Filled 2012-02-29 (×2): qty 1

## 2012-02-29 MED ORDER — MIDAZOLAM HCL 2 MG/2ML IJ SOLN
INTRAMUSCULAR | Status: AC
  Administered 2012-02-29: 2 mg via INTRAVENOUS
  Filled 2012-02-29: qty 2

## 2012-02-29 MED ORDER — IOHEXOL 300 MG/ML  SOLN
20.0000 mL | INTRAMUSCULAR | Status: AC
  Administered 2012-02-29 (×2): 20 mL via ORAL

## 2012-02-29 MED ORDER — MIDAZOLAM HCL 2 MG/2ML IJ SOLN
INTRAMUSCULAR | Status: AC
  Administered 2012-02-29: 4 mg
  Filled 2012-02-29: qty 4

## 2012-02-29 MED ORDER — SODIUM CHLORIDE 0.9 % IV BOLUS (SEPSIS)
1000.0000 mL | Freq: Once | INTRAVENOUS | Status: AC
  Administered 2012-02-29: 1000 mL via INTRAVENOUS

## 2012-02-29 MED ORDER — CHLORHEXIDINE GLUCONATE 0.12 % MT SOLN
15.0000 mL | Freq: Two times a day (BID) | OROMUCOSAL | Status: DC
  Administered 2012-02-29 – 2012-04-25 (×110): 15 mL via OROMUCOSAL
  Filled 2012-02-29 (×120): qty 15

## 2012-02-29 MED ORDER — SODIUM CHLORIDE 0.9 % IV BOLUS (SEPSIS): 500.0000 mL | INTRAVENOUS | Status: DC | PRN

## 2012-02-29 MED ORDER — ALBUTEROL SULFATE (5 MG/ML) 0.5% IN NEBU
2.5000 mg | INHALATION_SOLUTION | RESPIRATORY_TRACT | Status: DC
  Administered 2012-02-29: 2.5 mg via RESPIRATORY_TRACT
  Filled 2012-02-29: qty 0.5

## 2012-02-29 MED ORDER — VANCOMYCIN HCL 1000 MG IV SOLR
1250.0000 mg | Freq: Two times a day (BID) | INTRAVENOUS | Status: DC
  Administered 2012-02-29 – 2012-03-03 (×6): 1250 mg via INTRAVENOUS
  Filled 2012-02-29 (×6): qty 1250

## 2012-02-29 MED ORDER — DOBUTAMINE IN D5W 4-5 MG/ML-% IV SOLN
2.5000 ug/kg/min | INTRAVENOUS | Status: DC | PRN
  Filled 2012-02-29: qty 250

## 2012-02-29 MED ORDER — FENTANYL CITRATE 0.05 MG/ML IJ SOLN
25.0000 ug | INTRAMUSCULAR | Status: DC | PRN
  Administered 2012-02-29: 100 ug via INTRAVENOUS
  Administered 2012-02-29: 50 ug via INTRAVENOUS
  Administered 2012-02-29: 100 ug via INTRAVENOUS
  Administered 2012-02-29: 50 ug via INTRAVENOUS
  Filled 2012-02-29 (×3): qty 2

## 2012-02-29 MED ORDER — PIPERACILLIN-TAZOBACTAM 3.375 G IVPB 30 MIN: 3.3750 g | Freq: Once | INTRAVENOUS | Status: DC

## 2012-02-29 MED ORDER — ALBUTEROL SULFATE (5 MG/ML) 0.5% IN NEBU
2.5000 mg | INHALATION_SOLUTION | Freq: Four times a day (QID) | RESPIRATORY_TRACT | Status: DC
  Administered 2012-02-29 – 2012-03-07 (×26): 2.5 mg via RESPIRATORY_TRACT
  Filled 2012-02-29 (×26): qty 0.5

## 2012-02-29 MED ORDER — SODIUM CHLORIDE 0.9 % IV BOLUS (SEPSIS)
500.0000 mL | Freq: Once | INTRAVENOUS | Status: AC
  Administered 2012-02-29: 17:00:00 via INTRAVENOUS

## 2012-02-29 MED ORDER — FENTANYL BOLUS VIA INFUSION
50.0000 ug | Freq: Four times a day (QID) | INTRAVENOUS | Status: DC | PRN
  Filled 2012-02-29: qty 100

## 2012-02-29 MED ORDER — IPRATROPIUM BROMIDE 0.02 % IN SOLN
0.5000 mg | Freq: Four times a day (QID) | RESPIRATORY_TRACT | Status: DC
  Administered 2012-02-29 – 2012-03-01 (×3): 0.5 mg via RESPIRATORY_TRACT
  Filled 2012-02-29 (×3): qty 2.5

## 2012-02-29 MED ORDER — HALOPERIDOL LACTATE 5 MG/ML IJ SOLN
5.0000 mg | Freq: Four times a day (QID) | INTRAMUSCULAR | Status: DC | PRN
  Filled 2012-02-29: qty 1

## 2012-02-29 MED ORDER — SODIUM CHLORIDE 0.9 % IV SOLN
100.0000 mg | INTRAVENOUS | Status: DC
  Administered 2012-02-29: 100 mg via INTRAVENOUS
  Filled 2012-02-29 (×2): qty 100

## 2012-02-29 MED ORDER — HYDROCORTISONE SOD SUCCINATE 100 MG IJ SOLR
50.0000 mg | Freq: Four times a day (QID) | INTRAMUSCULAR | Status: DC
  Administered 2012-02-29 – 2012-03-01 (×3): 50 mg via INTRAVENOUS
  Filled 2012-02-29 (×7): qty 1

## 2012-02-29 MED ORDER — MIDAZOLAM HCL 2 MG/2ML IJ SOLN
2.0000 mg | INTRAMUSCULAR | Status: DC | PRN
  Administered 2012-02-29 – 2012-03-01 (×3): 2 mg via INTRAVENOUS
  Filled 2012-02-29 (×3): qty 2

## 2012-02-29 MED ORDER — FOLIC ACID 5 MG/ML IJ SOLN
1.0000 mg | Freq: Every day | INTRAMUSCULAR | Status: DC
  Administered 2012-02-29 – 2012-03-01 (×2): 1 mg via INTRAVENOUS
  Filled 2012-02-29 (×2): qty 0.2

## 2012-02-29 MED ORDER — SODIUM CHLORIDE 0.9 % IV SOLN
INTRAVENOUS | Status: DC
  Administered 2012-02-29: 100 mL/h via INTRAVENOUS

## 2012-02-29 MED ORDER — FENTANYL CITRATE 0.05 MG/ML IJ SOLN: 50.0000 ug | INTRAMUSCULAR | Status: DC | PRN

## 2012-02-29 MED ORDER — NOREPINEPHRINE BITARTRATE 1 MG/ML IJ SOLN
2.0000 ug/min | INTRAVENOUS | Status: DC | PRN
  Administered 2012-02-29: 5 ug/min via INTRAVENOUS
  Filled 2012-02-29: qty 4

## 2012-02-29 MED ORDER — FOLIC ACID 1 MG PO TABS: 1.0000 mg | ORAL_TABLET | Freq: Every day | ORAL | Status: DC

## 2012-02-29 MED ORDER — ENOXAPARIN SODIUM 40 MG/0.4ML ~~LOC~~ SOLN
40.0000 mg | SUBCUTANEOUS | Status: DC
  Administered 2012-02-29 – 2012-04-24 (×54): 40 mg via SUBCUTANEOUS
  Filled 2012-02-29 (×58): qty 0.4

## 2012-02-29 MED ORDER — IPRATROPIUM BROMIDE 0.02 % IN SOLN
0.5000 mg | RESPIRATORY_TRACT | Status: DC
  Administered 2012-02-29: 0.5 mg via RESPIRATORY_TRACT
  Filled 2012-02-29: qty 2.5

## 2012-02-29 MED ORDER — BIOTENE DRY MOUTH MT LIQD
15.0000 mL | Freq: Four times a day (QID) | OROMUCOSAL | Status: DC
  Administered 2012-03-01 – 2012-04-25 (×212): 15 mL via OROMUCOSAL

## 2012-02-29 MED ORDER — PROPOFOL 10 MG/ML IV EMUL
5.0000 ug/kg/min | INTRAVENOUS | Status: DC
  Administered 2012-02-29: 30 ug/kg/min via INTRAVENOUS

## 2012-02-29 MED ORDER — PROPOFOL 10 MG/ML IV EMUL: 5.0000 ug/kg/min | INTRAVENOUS | Status: DC

## 2012-02-29 MED ORDER — SODIUM CHLORIDE 0.9 % IV SOLN
25.0000 ug/h | INTRAVENOUS | Status: DC
  Administered 2012-02-29: 150 ug/h via INTRAVENOUS
  Administered 2012-03-01 (×2): 300 ug/h via INTRAVENOUS
  Administered 2012-03-02: 75 ug/h via INTRAVENOUS
  Filled 2012-02-29 (×4): qty 50

## 2012-02-29 MED ORDER — ALBUTEROL SULFATE (5 MG/ML) 0.5% IN NEBU
2.5000 mg | INHALATION_SOLUTION | RESPIRATORY_TRACT | Status: DC | PRN
  Administered 2012-03-05 (×2): 2.5 mg via RESPIRATORY_TRACT
  Filled 2012-02-29 (×3): qty 0.5

## 2012-02-29 MED ORDER — VANCOMYCIN HCL IN DEXTROSE 1-5 GM/200ML-% IV SOLN
1000.0000 mg | Freq: Once | INTRAVENOUS | Status: DC
  Filled 2012-02-29: qty 200

## 2012-02-29 MED ORDER — VASOPRESSIN 20 UNIT/ML IJ SOLN
0.0300 [IU]/min | INTRAVENOUS | Status: DC | PRN
  Administered 2012-02-29: 0.03 [IU]/min via INTRAVENOUS
  Filled 2012-02-29: qty 2.5

## 2012-02-29 NOTE — Progress Notes (Addendum)
ANTIBIOTIC CONSULT NOTE - INITIAL  Pharmacy Consult for vanc/zosyn Indication: pneumonia  Allergies  Allergen Reactions  . Lorazepam Other (See Comments)    Agitation,     Patient Measurements: Height: 6\' 2"  (188 cm) Weight: 195 lb 8.8 oz (88.7 kg) IBW/kg (Calculated) : 82.2   Vital Signs: Temp: 99.9 F (37.7 C) (05/14 1200) Temp src: Axillary (05/14 1200) BP: 128/82 mmHg (05/14 1600) Pulse Rate: 105  (05/14 1600) Intake/Output from previous day: 05/13 0701 - 05/14 0700 In: 4227.2 [P.O.:930; I.V.:3197.2; IV Piggyback:100] Out: 1550 [Urine:1550] Intake/Output from this shift: Total I/O In: 2558.3 [P.O.:320; I.V.:1188.3; IV Piggyback:1050] Out: 3250 [Urine:500; Emesis/NG output:2750]  Labs:  [facility] 02/29/12 0457 02/28/12 0505  WBC 14.0* 9.4  HGB 13.9 12.8*  PLT 162 149*  LABCREA -- --  CREATININE 1.01 1.24   Estimated Creatinine Clearance: 85.9 ml/min (by C-G formula based on Cr of 1.01). No results found for this basename: VANCOTROUGH:2,VANCOPEAK:2,VANCORANDOM:2,GENTTROUGH:2,GENTPEAK:2,GENTRANDOM:2,TOBRATROUGH:2,TOBRAPEAK:2,TOBRARND:2,AMIKACINPEAK:2,AMIKACINTROU:2,AMIKACIN:2, in the last 72 hours   Microbiology: Recent Results (from the past 720 hour(s))  SURGICAL PCR SCREEN     Status: Normal   Collection Time   02/25/12 10:31 AM      Component Value Range Status Comment   MRSA, PCR NEGATIVE  NEGATIVE  Final    Staphylococcus aureus NEGATIVE  NEGATIVE  Final     Medical History: Past Medical History  Diagnosis Date  . Hypertension   . Cancer   . Cancer of prostate     Medications:  Scheduled:    . albuterol  2.5 mg Nebulization Q6H  . chlorhexidine  15 mL Mouth/Throat BID  . enoxaparin  40 mg Subcutaneous Q24H  . etomidate      . folic acid  1 mg Oral Daily  . ipratropium  0.5 mg Nebulization Q6H  . midazolam      . pantoprazole (PROTONIX) IV  40 mg Intravenous QHS  . piperacillin-tazobactam (ZOSYN)  IV  3.375 g Intravenous Q8H  .  piperacillin-tazobactam  3.375 g Intravenous Once  . propofol      . sodium chloride  1,000 mL Intravenous Once  . sodium chloride  25 mL/kg Intravenous Once  . sodium chloride  500 mL Intravenous Once  . sodium chloride      . thiamine  100 mg Intravenous Daily  . vancomycin  1,000 mg Intravenous Once  . DISCONTD: albuterol  2.5 mg Nebulization TID  . DISCONTD: albuterol  2.5 mg Nebulization Q4H  . DISCONTD: enoxaparin  30 mg Subcutaneous Q24H  . DISCONTD: haloperidol lactate  5 mg Intramuscular Once  . DISCONTD: HYDROmorphone PCA 0.3 mg/mL   Intravenous Q4H  . DISCONTD: ipratropium  0.5 mg Nebulization TID  . DISCONTD: ipratropium  0.5 mg Nebulization Q4H  . DISCONTD: metoprolol  5 mg Intravenous Q6H  . DISCONTD: nicotine  14 mg Transdermal Daily  . DISCONTD: pantoprazole (PROTONIX) IV  40 mg Intravenous QHS   Assessment: 66 yo with h/o ETOH abuse who was tx from [facility] for resp failure. He was admitted to [facility] for SBO and is s/p exlap on 5/10. He was suspected for PNA so zosyn was started yesterday. Vanc is now added to cover for HCAP.   Goal of Therapy:  Vancomycin trough level 15-20 mcg/ml  Plan:  Vanc 1.25g IV q12 Zosyn 3.375g IV q8 F/u with trough at steady state  Michael Ellison 02/29/2012,4:39 PM  Adding micafungin to cover the gut.  Micafungin 100mg  IV q24

## 2012-02-29 NOTE — H&P (Signed)
Name: Michael Ellison MRN: [id number] DOB: 1946/07/21    LOS: 5  PULMONARY / CRITICAL CARE MEDICINE  HPI:   66 year old male w/ pmh of ETOH,  s/p expl lap for SBO on 5/10 at [facility]. Post-op course c/b delirium, first noted on 5/13 and increased bowel distention, and episodes of desaturation. He was placed on precedex infusion for agitation. He developed increased work of breathing and increased FIO2 requirements w/ post-op film showing progressive basilar volume loss and infiltrates. [facility] failure, concern for possible post-op aspiration and delirium .  Past Medical History  Diagnosis Date  . Hypertension   . Cancer   . Cancer of prostate    Past Surgical History  Procedure Date  . Hernia repair   . Prostate biopsy   . Robotic prostate surgery 2011  . Laparotomy 02/25/2012    Procedure: EXPLORATORY LAPAROTOMY;  Surgeon: [name], MD;  Location: [facility]S;  Service: General;  Laterality: N/A;  . Bowel resection 02/25/2012    Procedure: SMALL BOWEL RESECTION;  Surgeon: [name], MD;  Location: [facility];  Service: General;;   Prior to Admission medications   Medication Sig Start Date End Date Taking? Authorizing Provider  amLODipine (NORVASC) 5 MG tablet Take 5 mg by mouth daily.   Yes Historical Provider, MD  imipramine (TOFRANIL) 25 MG tablet Take 25 mg by mouth at bedtime.   Yes Historical Provider, MD  lisinopril (PRINIVIL,ZESTRIL) 20 MG tablet Take 20 mg by mouth daily.   Yes Historical Provider, MD   Allergies Allergies  Allergen Reactions  . Lorazepam Other (See Comments)    Agitation,     Family History Family History  Problem Relation Age of Onset  . Pseudochol deficiency Neg Hx   . Anesthesia problems Neg Hx   . Hypotension Neg Hx   . Malignant hyperthermia Neg Hx    Social History  reports that he has been smoking.  He does not have any smokeless tobacco history on file. He reports that he drinks  alcohol. He reports that he does not use illicit drugs.  Review Of Systems:  Unable to provide.  Brief patient description:   66 year old male w/ pmh of ETOH, s/p expl lap for SBO on 5/10. Post-op course c/b progressive agitation (presumed w/d), post-op ileus, and aspiration w/ progressive respiratory failure. Arrived at [facility] in acute distress. Intubated and placed on Vent.   Vital Signs: Temp:  [97.8 F (36.6 C)-99.9 F (37.7 C)] 99.9 F (37.7 C) (05/14 1200) Pulse Rate:  [81-144] 117  (05/14 1530) Resp:  [24-47] 39  (05/14 1530) BP: (84-156)/(58-97) 115/72 mmHg (05/14 1530) SpO2:  [85 %-99 %] 93 % (05/14 1530) FiO2 (%):  [50 %-100 %] 100 % (05/14 1530) Weight:  [88.7 kg (195 lb 8.8 oz)] 88.7 kg (195 lb 8.8 oz) (05/14 0500)  Physical Examination: General:  Intubated, mechanically ventilated, no acute distress, was in severe distress pre ETT Neuro:  Sedated, confused w/ slurred speech  HEENT:  PERRL, pink conjunctivae, dry membranes, with dried emesis on posterior pharynx.  Neck:  Supple, no JVD   Cardiovascular:  RRR, no M/R/G Lungs:  Diffuse rhonchi, decreased on left.  Abdomen:  Soft,tender, distended, bowel sounds present but decreased. Bilious  Musculoskeletal:  Moves all extremities, no pedal edema Skin:  No rash  Active Problems:  * No active hospital problems. *    ASSESSMENT AND PLAN  PULMONARY  Lab  02/29/12 0500  PHART 7.461*  PCO2ART 37.4  PO2ART 58.8*  HCO3 26.3*  O2SAT 90.3   Ventilator Settings: Vent Mode:  [-]  FiO2 (%):  [50 %-100 %] 100 % CXR:  Bibasilar volume loss.  ETT:  5/14>>>increased infiltrate rt , et wnl, line wnl  A:  Acute respiratory failure in setting of progressive pulmonary infiltrates (presumed aspiration) and atelectasis P:   -requires emergent intubation, distress, failure -Tv to 8 cc/kg, some concern ARDS development, assess cvp, 8 cc/kg, may need ards protocol -full vent support -sedation protocol -f/u abg -send culture  data/empiric abx per ID section below Bile noted and suctioned from lungs ett, no role bronch , no collapse  CARDIOVASCULAR No results found for this basename: TROPONINI:5,LATICACIDVEN:5, O2SATVEN:5,PROBNP:5 in the last 168 hours ECG:   Lines:  left Ruth CVL 5/14>>>  A: Septic shock/hypovolemic shock: in setting of aspiration PNA, exacerbated by sedation and s/p intubation.  P:  -EGDT protocol may be required, lactic acid, cortisol then stress steroids -hold all antihypertensives.  -send cortisol then stress roids  RENAL  Lab 02/29/12 0457 02/28/12 0505 02/26/12 0520 02/25/12 0549 02/24/12 0858  NA 143 143 140 139 139  K 3.4* 3.3* -- -- --  CL 105 108 103 101 100  CO2 28 23 24 28 28   BUN 30* 46* 35* 19 12  CREATININE 1.01 1.24 3.46* 0.80 0.85  CALCIUM 8.6 7.8* 8.2* 9.7 10.7*  MG -- -- -- -- --  PHOS -- -- -- -- --   Intake/Output      05/13 0701 - 05/14 0700 05/14 0701 - 05/15 0700   P.O. 930 320   I.V. (mL/kg) 3197.2 (36) 921 (10.4)   IV Piggyback 100 50   Total Intake(mL/kg) 4227.2 (47.7) 1291 (14.6)   Urine (mL/kg/hr) 1550 (0.7) 500 (0.6)   Emesis/NG output  2500   Total Output 1550 3000   Net +2677.2 -1709         Foley:  5/13  A:  Hypokalmemia P:   -recheck and replace PRN  GASTROINTESTINAL  Lab 02/24/12 0858  AST 24  ALT 22  ALKPHOS 64  BILITOT 0.9  PROT 8.7*  ALBUMIN 4.3    A:  Post op ileus s/p exploratory lap on 5/10, ileus P:   -gastric decompression -bowel rest -will ask surgical services to follow on 5/15 -KUB -LFT  HEMATOLOGIC  Lab 02/29/12 0457 02/28/12 0505 02/26/12 0520 02/25/12 0549 02/24/12 0858  HGB 13.9 12.8* 15.0 16.6 17.6*  HCT 41.1 38.3* 45.5 48.7 50.6  PLT 162 149* 180 266 258  INR -- -- -- -- --  APTT -- -- -- -- --   A:  Anemia. Think hgb reflects dilution today P:  -recheck cbc -cont lmwh -cont ppi -require volume, likely to drop hct  INFECTIOUS  Lab 02/29/12 0457 02/28/12 0505 02/26/12 0520 02/25/12 0549  02/24/12 0858  WBC 14.0* 9.4 12.3* 12.2* 20.1*  PROCALCITON -- -- -- -- --   Cultures: BCX2 5/14>>> UC 5/14>>> Sputum 5/14>>  Antibiotics: nosocomial asp vanc 5/14>>> Zosyn 5/13>>>  A:  Aspiration PNA w/ sepsis  P:   -cultures sent, will follow -empiric HCAP coverage.  -see cv section above  ENDOCRINE No results found for this basename: GLUCAP:5 in the last 168 hours A:  No h/o DM P:   -check CBG q 4  NEUROLOGIC  A:  Acute encephalopathy P:   -sedation protocol, fentanyl Ativan allergy avoid versed until better history obtained? -serial neuro checks -thiamine and  folate.   BEST PRACTICE / DISPOSITION - Level of Care:  icu - Primary Service:  pccm - Consultants:   - Code Status:  full - Diet:  npo - DVT Px:  lmwh - GI Px:  ppi - Skin Integrity:  intact - Social / Family:  Pending update.   [name], M.D. Pulmonary and Critical Care Medicine [facility] Pager: [phone]  02/29/2012, 3:47 PM  Ccm 55 min   I have fully examined this patient and agree with above findings.    And edited in full  Bile aspiration PRE ETT. Likely at risk ARDS, consider ARDS protocol, volume egdt. kub   Mcarthur Rossetti. [name], MD, FACP Pgr: [phone] [facility]

## 2012-02-29 NOTE — Procedures (Signed)
Central Venous Catheter Insertion Procedure Note AENEAS LONGSWORTH [id number] December 27, 1945  Procedure: Insertion of Central Venous Catheter Indications: Assessment of intravascular volume and Drug and/or fluid administration  Procedure Details Consent: Risks of procedure as well as the alternatives and risks of each were explained to the (patient/caregiver).  Consent for procedure obtained. Time Out: Verified patient identification, verified procedure, site/side was marked, verified correct patient position, special equipment/implants available, medications/allergies/relevent history reviewed, required imaging and test results available.  Performed  Maximum sterile technique was used including antiseptics, cap, gloves, gown, hand hygiene, mask and sheet. Skin prep: Chlorhexidine; local anesthetic administered A antimicrobial bonded/coated triple lumen catheter was placed in the left subclavian vein using the Seldinger technique.  Evaluation Blood flow good Complications: No apparent complications Patient did tolerate procedure well. Chest X-ray ordered to verify placement.  CXR: pending.  BABCOCK,PETE 02/29/2012, 4:37 PM  Emergent need, access, volume  Mcarthur Rossetti. [name], MD, FACP Pgr: [phone] [facility]

## 2012-02-29 NOTE — Progress Notes (Signed)
Paged Dr. Leticia Penna to let him know that pt kept BiPAP on for about an hour because pt states that he can not tolerate the high pressure and the constant air blowing in his face.  Pt is alert and oriented, family at the bedside; have explained to the pt that he may need to be intubated if he continues to breath with a respiratory rate in the 50's.  Family has requested transfer to [facility].  Dr. Leticia Penna has indicated that he will begin working on the transfer.

## 2012-02-29 NOTE — Progress Notes (Signed)
Report called to nurse on Dept 2100.  Pt is A&O, VSS.  Pt's family is at bedside and aware of transfer.  Pt transferring to [facility] room 2109.  [facility] is here now and ready to leave with the pt on a stretcher in route to [facility].

## 2012-02-29 NOTE — Progress Notes (Signed)
Rn Michael Ellison informed of pt decline appearance in respiratory status and very uncomfortable. Pt treated all night and o2 increased.

## 2012-02-29 NOTE — Discharge Summary (Signed)
Physician Discharge Summary  Patient ID: Michael Ellison MRN: [id number] DOB/AGE: [birth year] 66 y.o.  Admit date: 02/24/2012 Discharge date: 02/29/2012  Admission Diagnoses:Small bowel obstruction  Discharge Diagnoses: S/p small bowel resection for SBO, EtOH withdrawal, possible progressing Right Lower lobe pneumonia.  Bilateral atelectasis, chronic obstructive pulmonary disease exacerbation. Active Problems:  * No active hospital problems. *    Discharged Condition: fair  Hospital Course: Patient presented to [facility] with nausea vomiting and abdominal pain.  He had prior history of robotic assisted prostate resection.  He was admitted initially for conservative management.  While vitals and laboratory evaluation improved, he clinically appeared more distended and more uncomfortable over the next 24 hours.  He was taken to the OR and underwent exploratory laparotomy.  It was noted that a loop of distal small intestine was adherent to the lower anterior pelvic wall.  This was freed but due to the ragged appearance of the serosa, the bowel was resected and primarily approximated in side-side fashion.  With his pulmonary history he was monitored post-operatively in the step-down unit.  Initial course was uneventful, however, he became more anxious on late POD 1.  Ativan was administered but only led to increased combative behavior.  Haldol was administered with some improvement yet patient remained agitated and pulled most of his tubing off including oxygen mask, NG, IV, and JP drain.  IV access was re-established and Precedex drip was started.  This did help sedate patient who was suspected to be experiencing alcohol withdrawal symptoms.  His acute renal failure did improve with continued IV fluid resusitation.  Patient did have noted atelectasis and attempts to encourage deep respirations were limited by the patient's pain, sedation, and while awake, refusal to use incentive spirometry,  CXR obtained on  POD3 demonstrated continued atelectasis and questionable Right lower lobe infiltrate.  Empiric antibiotic coverage was started.  Patient did have reported bowel function and as his confusion improved was started on limited clears.  His respiratory status failed to improve and as patient did not tolerate BiPAP, family requested transfer.  Transfer was discussed with [facility] critical care pulmonology.       Consults: pulmonary/intensive care  Significant Diagnostic Studies: labs: daily labs and radiology: CXR: atelectasis bilaterally and infiltrates: lower lobe on the right and CT scan: abdomen/pelvis  Treatments: IV hydration, antibiotics: Zosyn, analgesia: Dilaudid, respiratory therapy: O2 and albuterol/atropine nebulizer and surgery: ex lap, SB resection  Discharge Exam: Blood pressure 89/56, pulse 103, temperature 98.2 F (36.8 C), temperature source Oral, resp. rate 19, height 6\' 2"  (1.88 m), weight 88.7 kg (195 lb 8.8 oz), SpO2 100.00%. General appearance: no distress and awake, uncomfortable in appearance. Eyes: PERR, EOMI Resp: Course bilateral breath sounds, diminshed bases Cardio: tachycardia, reg rhythm GI: intermittent bowel sounds, soft, mild/moderate distention.  Expected tenderness.  Incision intact.   Extremities: no edema, redness or tenderness in the calves or thighs  Disposition: 01-Home or Self Care   Medication List  As of 02/29/2012  9:36 PM   ASK your doctor about these medications         amLODipine 5 MG tablet   Commonly known as: NORVASC   Take 5 mg by mouth daily.      imipramine 25 MG tablet   Commonly known as: TOFRANIL   Take 25 mg by mouth at bedtime.      lisinopril 20 MG tablet   Commonly known as: PRINIVIL,ZESTRIL   Take 20 mg by mouth daily.  SignedFabio Bering 02/29/2012, 9:36 PM

## 2012-02-29 NOTE — Progress Notes (Signed)
Called MD about patients condition, CXR and ABG were ordered. Called family to inform them of change in patients condition and they were on the way to the hospital.  Called MD back when results were back, MD looked at CXR and ABG and gave orders to continue to monitor patient closely and give pain medication as ordered.

## 2012-02-29 NOTE — Progress Notes (Signed)
Family at patients bedside, informed family and patient that we will continue to monitor patient for now.

## 2012-02-29 NOTE — Progress Notes (Addendum)
Family requesting to talk to Dr. Leticia Penna, Dr Leticia Penna paged at this time. [phone])  2nd page to Dr. Leticia Penna at [phone].   Called cell phone at [phone]. No answer. Left message to call me back.  Called Dr. Colletta Maryland to talk to family 0630.  Dr. Colletta Maryland felt like the family needed to talk to Dr. Suzette Battiest and if he felt like he needed to be transferred for Dr. Leticia Penna to call [facility] for transport.  Called Dr. Leticia Penna again at [phone]. Talked to MD about events throughout the night, gave him CXR and ABG and AM labs results, he is now talking to family at this time. Told MD about familys concerns, and they are upset with his care at this point and wanting him transferred to [facility]. [phone])

## 2012-02-29 NOTE — Progress Notes (Addendum)
eLink Physician-Brief Progress Note Patient Name: CARLIN ATTRIDGE DOB: November 26, 1945 MRN: [id number]  Date of Service  02/29/2012   HPI/Events of Note   Variable  0 Points 1 Point 2 Points Total  Heart rate per minute  <90 beats 90-109 beats >110 beats 2  Respiratory  Rate per minute < 18 breaths 19-30 breaths  >30 breaths 2  Restlessness; nonpurposeful movements None  occas slight movement Frequent movement 1  Paradoxical breathing pattern: None  Present 0  Accessory muscle use: rise in clavicle during inspiration None Slight rise Pronounced rise 2  Grunting at end-expiration: guttural sound None  Present 0  Nasal flaring: involuntary movement of nares None  Present 2  Look of fear None  Eyes wide 0  Overall total out of 16    9    Respiratory Distress Observation Scale Journal of Palliative Medicine Vol. 13, Number 3, 2010 Campbell et al.    eICU Interventions  Check cxr and abg and decide on itnubation at 4:56am  Addendum 5:41 AM  Lab 02/29/12 0500  PHART 7.461*  PCO2ART 37.4  PO2ART 58.8*  HCO3 26.3*  TCO2 22.7  O2SAT 90.3   CXR - basal atelectasis  ASSESSMENT   - suspect splinting due to PAIN. On questioning by RN, patient admitst to pain 6 of 10 and feeling uncomfortable. No pain meds given post op  PLAN  - monitor - fentanyl prn   Intervention Category Major Interventions: Respiratory failure - evaluation and management  Nekia Maxham 02/29/2012, 4:56 AM

## 2012-02-29 NOTE — Progress Notes (Signed)
eLink Physician-Brief Progress Note Patient Name: Michael Ellison DOB: 1946-04-01 MRN: [id number]  Date of Service  02/29/2012   HPI/Events of Note  Several family members at bedside. RN calling elink. Says family members upset seeing patient in distress and in pain. They want transfer to [facility] hospital.  eICU Interventions  I spoke to patient family member ? Yolanda whol expressed same concern. I told them that I had treated pain. Recommended family meeting with Dr Leticia Penna and then request [facility] to [facility]    Intervention Category Intermediate Interventions: Communication with other healthcare providers and/or family  Makenize Messman 02/29/2012, 6:42 AM

## 2012-02-29 NOTE — Procedures (Signed)
Intubation Procedure Note NIL XIONG [id number] [birth year]  Procedure: Intubation Indications: Airway protection and maintenance, emregemt sat 89% on 100%, rr 50, tachy  Procedure Details Consent: Unable to obtain consent because of emergent medical necessity. Time Out: Verified patient identification, verified procedure, site/side was marked, verified correct patient position, special equipment/implants available, medications/allergies/relevent history reviewed, required imaging and test results available.  Performed  Maximum sterile technique was used including cap, hand hygiene and mask.  MAC and 4    Evaluation Hemodynamic Status: BP stable throughout; O2 sats: stable throughout Patient's Current Condition: stable Complications: No apparent complications Patient did tolerate procedure well. Chest X-ray ordered to verify placement.  CXR: pending.   After ETT placed, bile back 200 cc from suction lungs  Mcarthur Rossetti. [name], MD, FACP Pgr: [phone] [facility]   BABCOCK,PETE 02/29/2012

## 2012-02-29 NOTE — Procedures (Signed)
Arterial Catheter Insertion Procedure Note MARISSA LOWREY [id number] 1946/01/07  Procedure: Insertion of Arterial Catheter  Indications: Blood pressure monitoring  Procedure Details Consent: Unable to obtain consent because of emergent medical necessity. Time Out: Verified patient identification, verified procedure, site/side was marked, verified correct patient position, special equipment/implants available, medications/allergies/relevent history reviewed, required imaging and test results available.  Performed  Maximum sterile technique was used including antiseptics, cap, gloves, gown, hand hygiene, mask and sheet. Skin prep: Chlorhexidine; local anesthetic administered 20 gauge catheter was inserted into right femoral artery using the Seldinger technique.  Evaluation Blood flow good; BP tracing good. Complications: No apparent complications  [place] gudiance  Blood loss min  Failed radials bilat.   Nelda Bucks 02/29/2012

## 2012-03-01 ENCOUNTER — Encounter (HOSPITAL_COMMUNITY): Payer: Self-pay | Admitting: General Surgery

## 2012-03-01 ENCOUNTER — Inpatient Hospital Stay (HOSPITAL_COMMUNITY): Payer: Medicare Other

## 2012-03-01 DIAGNOSIS — J9600 Acute respiratory failure, unspecified whether with hypoxia or hypercapnia: Secondary | ICD-10-CM | POA: Diagnosis present

## 2012-03-01 DIAGNOSIS — T50904A Poisoning by unspecified drugs, medicaments and biological substances, undetermined, initial encounter: Secondary | ICD-10-CM

## 2012-03-01 DIAGNOSIS — J80 Acute respiratory distress syndrome: Secondary | ICD-10-CM

## 2012-03-01 DIAGNOSIS — J95821 Acute postprocedural respiratory failure: Secondary | ICD-10-CM | POA: Diagnosis present

## 2012-03-01 DIAGNOSIS — F1010 Alcohol abuse, uncomplicated: Secondary | ICD-10-CM

## 2012-03-01 DIAGNOSIS — R000 Tachycardia, unspecified: Secondary | ICD-10-CM | POA: Diagnosis present

## 2012-03-01 DIAGNOSIS — F19921 Other psychoactive substance use, unspecified with intoxication with delirium: Secondary | ICD-10-CM

## 2012-03-01 DIAGNOSIS — J690 Pneumonitis due to inhalation of food and vomit: Secondary | ICD-10-CM | POA: Diagnosis present

## 2012-03-01 DIAGNOSIS — K9189 Other postprocedural complications and disorders of digestive system: Secondary | ICD-10-CM | POA: Diagnosis present

## 2012-03-01 DIAGNOSIS — R451 Restlessness and agitation: Secondary | ICD-10-CM | POA: Diagnosis present

## 2012-03-01 DIAGNOSIS — I10 Essential (primary) hypertension: Secondary | ICD-10-CM | POA: Diagnosis present

## 2012-03-01 DIAGNOSIS — R109 Unspecified abdominal pain: Secondary | ICD-10-CM | POA: Diagnosis present

## 2012-03-01 DIAGNOSIS — G9340 Encephalopathy, unspecified: Secondary | ICD-10-CM | POA: Diagnosis present

## 2012-03-01 DIAGNOSIS — A419 Sepsis, unspecified organism: Secondary | ICD-10-CM

## 2012-03-01 DIAGNOSIS — T8131XA Disruption of external operation (surgical) wound, not elsewhere classified, initial encounter: Secondary | ICD-10-CM

## 2012-03-01 HISTORY — DX: Pneumonitis due to inhalation of food and vomit: J69.0

## 2012-03-01 LAB — POCT I-STAT 3, ART BLOOD GAS (G3+)
Bicarbonate: 27.7 mEq/L — ABNORMAL HIGH (ref 20.0–24.0)
Patient temperature: 99.2
TCO2: 29 mmol/L (ref 0–100)
pCO2 arterial: 46 mmHg — ABNORMAL HIGH (ref 35.0–45.0)
pH, Arterial: 7.389 (ref 7.350–7.450)
pO2, Arterial: 63 mmHg — ABNORMAL LOW (ref 80.0–100.0)

## 2012-03-01 LAB — GLUCOSE, CAPILLARY
Glucose-Capillary: 96 mg/dL (ref 70–99)
Glucose-Capillary: 102 mg/dL — ABNORMAL HIGH (ref 70–99)
Glucose-Capillary: 99 mg/dL (ref 70–99)
Glucose-Capillary: 118 mg/dL — ABNORMAL HIGH (ref 70–99)

## 2012-03-01 LAB — BASIC METABOLIC PANEL
Calcium: 7.9 mg/dL — ABNORMAL LOW (ref 8.4–10.5)
GFR calc Af Amer: >90 mL/min (ref >90)
GFR calc non Af Amer: >90 mL/min (ref >90)
Glucose, Bld: 97 mg/dL (ref 70–99)
Potassium: 4.1 mEq/L (ref 3.5–5.1)
Sodium: 147 mEq/L — ABNORMAL HIGH (ref 135–145)

## 2012-03-01 LAB — CORTISOL: Cortisol, Plasma: 23.2 ug/dL

## 2012-03-01 LAB — CBC
MCH: 30.6 pg (ref 26.0–34.0)
Platelets: 142 10*3/uL — ABNORMAL LOW (ref 150–400)
RBC: 3.92 MIL/uL — ABNORMAL LOW (ref 4.22–5.81)
RDW: 14.9 % (ref 11.5–15.5)
WBC: 12.5 10*3/uL — ABNORMAL HIGH (ref 4.0–10.5)

## 2012-03-01 LAB — MAGNESIUM: Magnesium: 2.3 mg/dL (ref 1.5–2.5)

## 2012-03-01 LAB — PHOSPHORUS: Phosphorus: 2.3 mg/dL (ref 2.3–4.6)

## 2012-03-01 MED ORDER — SODIUM CHLORIDE 0.9 % IJ SOLN
10.0000 mL | INTRAMUSCULAR | Status: DC | PRN
  Administered 2012-03-01 – 2012-03-09 (×3): 10 mL

## 2012-03-01 MED ORDER — CLONIDINE HCL 0.3 MG/24HR TD PTWK
0.3000 mg | MEDICATED_PATCH | TRANSDERMAL | Status: DC
  Administered 2012-03-01: 0.3 mg via TRANSDERMAL
  Filled 2012-03-01: qty 1

## 2012-03-01 MED ORDER — METOPROLOL TARTRATE 1 MG/ML IV SOLN
5.0000 mg | Freq: Once | INTRAVENOUS | Status: AC
  Administered 2012-03-01: 5 mg via INTRAVENOUS

## 2012-03-01 MED ORDER — SODIUM CHLORIDE 0.9 % IJ SOLN
10.0000 mL | Freq: Two times a day (BID) | INTRAMUSCULAR | Status: DC
  Administered 2012-03-01 – 2012-03-07 (×10): 10 mL
  Administered 2012-03-07: 20 mL
  Administered 2012-03-08 – 2012-03-09 (×2): 10 mL
  Administered 2012-03-10: 30 mL
  Administered 2012-03-11 – 2012-03-12 (×4): 10 mL
  Administered 2012-03-13: 30 mL
  Administered 2012-03-13 – 2012-03-17 (×8): 10 mL

## 2012-03-01 MED ORDER — HYDRALAZINE HCL 20 MG/ML IJ SOLN
10.0000 mg | INTRAMUSCULAR | Status: DC | PRN
  Filled 2012-03-01 (×2): qty 2

## 2012-03-01 MED ORDER — POTASSIUM CHLORIDE 10 MEQ/50ML IV SOLN
INTRAVENOUS | Status: AC
  Filled 2012-03-01: qty 50

## 2012-03-01 MED ORDER — INSULIN ASPART 100 UNIT/ML ~~LOC~~ SOLN
0.0000 [IU] | Freq: Four times a day (QID) | SUBCUTANEOUS | Status: DC
  Administered 2012-03-02: 2 [IU] via SUBCUTANEOUS
  Administered 2012-03-02 – 2012-03-06 (×7): 1 [IU] via SUBCUTANEOUS
  Administered 2012-03-07: 2 [IU] via SUBCUTANEOUS
  Administered 2012-03-07: 3 [IU] via SUBCUTANEOUS

## 2012-03-01 MED ORDER — METOPROLOL TARTRATE 1 MG/ML IV SOLN
INTRAVENOUS | Status: AC
  Filled 2012-03-01: qty 5

## 2012-03-01 MED ORDER — FUROSEMIDE 10 MG/ML IJ SOLN
40.0000 mg | Freq: Once | INTRAMUSCULAR | Status: AC
  Administered 2012-03-01: 40 mg via INTRAVENOUS
  Filled 2012-03-01: qty 4

## 2012-03-01 MED ORDER — MIDAZOLAM HCL 2 MG/2ML IJ SOLN
0.5000 mg | INTRAMUSCULAR | Status: DC | PRN
  Administered 2012-03-01 – 2012-03-02 (×4): 0.5 mg via INTRAVENOUS
  Filled 2012-03-01 (×3): qty 2

## 2012-03-01 MED ORDER — LORAZEPAM 2 MG/ML IJ SOLN
INTRAMUSCULAR | Status: AC
  Administered 2012-03-01: 0.5 mg via INTRAVENOUS
  Filled 2012-03-01: qty 1

## 2012-03-01 MED ORDER — METOPROLOL TARTRATE 1 MG/ML IV SOLN
2.5000 mg | INTRAVENOUS | Status: DC | PRN
  Administered 2012-03-01 – 2012-03-02 (×4): 5 mg via INTRAVENOUS
  Filled 2012-03-01 (×4): qty 5

## 2012-03-01 MED ORDER — FAT EMULSION 20 % IV EMUL
250.0000 mL | INTRAVENOUS | Status: AC
  Administered 2012-03-01: 250 mL via INTRAVENOUS
  Filled 2012-03-01: qty 250

## 2012-03-01 MED ORDER — TRACE MINERALS CR-CU-MN-SE-ZN 10-1000-500-60 MCG/ML IV SOLN
INTRAVENOUS | Status: AC
  Administered 2012-03-01: 18:00:00 via INTRAVENOUS
  Filled 2012-03-01: qty 1000

## 2012-03-01 MED ORDER — POTASSIUM CHLORIDE 10 MEQ/50ML IV SOLN
10.0000 meq | INTRAVENOUS | Status: AC
  Administered 2012-03-01 – 2012-03-02 (×3): 10 meq via INTRAVENOUS

## 2012-03-01 MED ORDER — LORAZEPAM 2 MG/ML IJ SOLN
0.5000 mg | INTRAMUSCULAR | Status: DC | PRN
  Administered 2012-03-01: 0.5 mg via INTRAVENOUS

## 2012-03-01 MED ORDER — POTASSIUM CHLORIDE 10 MEQ/50ML IV SOLN
INTRAVENOUS | Status: AC
  Administered 2012-03-01: 10 meq via INTRAVENOUS
  Filled 2012-03-01: qty 50

## 2012-03-01 NOTE — Consult Note (Signed)
I agree with my PA that the upper portion of the abdominal wound has a 6 cm x 3 cm area of fascial dehiscence without evisceration.  Even so, this may require re-exploration.  Will re-examine patient in the AM.  Marta Lamas. [name], MD, FACS [phone] [facility]

## 2012-03-01 NOTE — Procedures (Signed)
Extubation Procedure Note  Patient Details:   Name: ARNEL WYMER DOB: 13-Nov-1945 MRN: [id number]   Airway Documentation:     Evaluation  O2 sats: stable throughout Complications: No apparent complications Patient did tolerate procedure well. Bilateral Breath Sounds: Rhonchi Suctioning: Airway Pt extubated per MD order. Tolerated well. Able to verbalize. No stridor noted.  Derryl Harbor 03/01/2012, 10:49 AM

## 2012-03-01 NOTE — Progress Notes (Addendum)
Name: Michael Ellison MRN: [id number] DOB: [birth year]    LOS: 6  PULMONARY / CRITICAL CARE MEDICINE  Pt Profile:   66 year old male w/ pmh of ETOH,  s/p expl lap for SBO due to adhesions on 5/10 at [facility]. Post-op course c/b delirium, persistent ileus distention and episodic desaturation due to suspected aspiration. Transferred to [facility] service for treatment of acute respiratory failure, concern for possible post-op aspiration and delirium. Intubated shortly after arrival.   Interval hx: RASS +1, + F/C, Passed SBT - extubated. Strong cough, marginal respiratory status post extubation  Vital Signs: Temp:  [98.1 F (36.7 C)-98.6 F (37 C)] 98.6 F (37 C) (05/15 1144) Pulse Rate:  [95-134] 134  (05/15 1630) Resp:  [14-36] 36  (05/15 1630) BP: (63-169)/(46-108) 158/108 mmHg (05/15 1600) SpO2:  [88 %-100 %] 99 % (05/15 1630) FiO2 (%):  [40 %-60 %] 40 % (05/15 1600) Weight:  [85.9 kg (189 lb 6 oz)] 85.9 kg (189 lb 6 oz) (05/15 0500)  Physical Examination: General:  RASS +1, + F/C Neuro:  No focal deficits HEENT:  WNL Neck:  Supple, no JVD   Cardiovascular: Tachy, reg, no M/R/G Lungs:  Scattered rhonchi, small amount of ET secretions prior to extubation Abdomen:  No BS, firm, distended, mildly tender diffusely  Musculoskeletal:  Moves all extremities, no pedal edema Skin:  No rash  Principal Problem:  *Respiratory failure following trauma and surgery Active Problems:  Aspiration pneumonia  Alcohol abuse  Ileus following gastrointestinal surgery  BP (high blood pressure)  Sinus tachycardia  Delirium, drug-induced  Agitation   ASSESSMENT AND PLAN  PULMONARY  Lab 03/01/12 0440 02/29/12 1852 02/29/12 1756 02/29/12 1720 02/29/12 0500  PHART 7.389 -- 7.343* -- 7.461*  PCO2ART 46.0* -- 42.3 -- 37.4  PO2ART 63.0* -- 87.0 -- 58.8*  HCO3 27.7* -- 22.9 -- 26.3*  O2SAT 91.0 73.2 96.0 71.9 90.3   CT chest 5/14: R > L lower lobe consolidation Ventilator Settings: Vent Mode:   [-] CPAP;PSV FiO2 (%):  [40 %-60 %] 40 % Set Rate:  [0 bmp-20 bmp] 20 bmp Vt Set:  [500 mL] 500 mL PEEP:  [5 cmH20] 5 cmH20 Pressure Support:  [5 cmH20-10 cmH20] 5 cmH20 Plateau Pressure:  [10 cmH20-20 cmH20] 20 cmH20 CXR:  Bibasilar volume loss.  ETT:  5/14>>>increased infiltrate rt , et wnl, line wnl  A:  Acute respiratory failure after aspiration of bilious emesis P:   -Passed SBT - extubated. Marginal post extubation - Post extubation airway hygiene    CARDIOVASCULAR  Lab 02/29/12 1630  TROPONINI <0.30  LATICACIDVEN 1.3  PROBNP --   ECG:   Lines:  left [place] CVL 5/14>>>  A: Septic shock/hypovolemic shock - resolved, hypertension, sinus tachycardia - reactive P:  - PRN hydralazine and metoprolol ordered   RENAL  Lab 03/01/12 1223 02/29/12 1630 02/29/12 0457 02/28/12 0505 02/26/12 0520  NA 147* 147* 143 143 140  K 4.1 3.3* -- -- --  CL 112 111 105 108 103  CO2 26 27 28 23 24   BUN 21 28* 30* 46* 35*  CREATININE 0.73 0.94 1.01 1.24 3.46*  CALCIUM 7.9* 7.6* 8.6 7.8* 8.2*  MG 2.3 -- -- -- --  PHOS 2.3 -- -- -- --   Intake/Output      05/14 0701 - 05/15 0700 05/15 0701 - 05/16 0700   P.O. 320    I.V. (mL/kg) 2988.7 (34.8) 428.1 (5)   NG/GT 650    IV Piggyback 1766 100  Total Intake(mL/kg) 5724.7 (66.6) 528.1 (6.1)   Urine (mL/kg/hr) 1575 (0.8) 710 (0.8)   Emesis/NG output 3250    Total Output 4825 710   Net +899.7 -181.9         Foley:  5/13  A:  Hypokalmemia - resolved P:   -monitor electrolytes   GASTROINTESTINAL  Lab 02/29/12 1630 02/24/12 0858  AST 32 24  ALT 27 22  ALKPHOS 45 64  BILITOT 0.7 0.9  PROT 5.5* 8.7*  ALBUMIN 1.9* 4.3   CTAP 5/14: Residual dilatation of stomach and proximal small bowel with decompressed distal small bowel and colon. Changes could represent ileus or residual obstruction. Dilatation extends distal to the area of the anastomoses. Nonspecific wall thickening of jejunal loops may represent edema. No free air or free  fluid to suggest perforation.   A:  Post op ileus s/p exploratory lap on 5/10 P:   - Cont gastric decompression with NGT - TPN ordered - CCS consult 5/15 - appreciated   HEMATOLOGIC  Lab 03/01/12 0500 02/29/12 1630 02/29/12 0457 02/28/12 0505 02/26/12 0520  HGB 12.0* 12.4* 13.9 12.8* 15.0  HCT 35.4* 36.0* 41.1 38.3* 45.5  PLT 142* 122* 162 149* 180  INR -- 1.16 -- -- --  APTT -- 21* -- -- --   A:  Mild Anemia - no evidence of acute blood loss. No indication for PRBCs P:  -Monitor CBC intermittently   INFECTIOUS  Lab 03/01/12 0500 02/29/12 1630 02/29/12 0457 02/28/12 0505 02/26/12 0520  WBC 12.5* 13.4* 14.0* 9.4 12.3*  PROCALCITON -- 52.60 -- -- --   Cultures: BCX2 5/14 >> UC 5/14 >> Sputum 5/14 >>  Antibiotics: nosocomial asp vanc 5/14 >> Zosyn 5/13 >>  A:  Aspiration PNA  P:   - cultures sent, will follow - Cont empiric HCAP coverage.    ENDOCRINE  Lab 03/01/12 1142 03/01/12 0820 03/01/12 0357 03/01/12 0009 02/29/12 1951  GLUCAP 99 102* 96 97 111*   A:  No h/o DM P:   -check CBG q 4   NEUROLOGIC  A:  Acute encephalopathy - markedly improved. Appears to have had adverse reaction to lorazepam but has tolerated midazolam.  Post-operative pain P:   - PRN Versed, Haldol - Cont thiamine and folate.  - Cont fentanyl gtt  BEST PRACTICE / DISPOSITION - Level of Care:  icu - Primary Service:  pccm - Consultants:  CCS - Code Status:  full - Diet:  TPN - DVT Px:  lmwh - GI Px:  ppi - Social / Family:  Updated @ bedside    45 mins CCM time   Billy Fischer, M.D. Pulmonary and Critical Care Medicine [facility] Pager: [phone]  03/01/2012, 4:48 PM

## 2012-03-01 NOTE — Progress Notes (Addendum)
INITIAL ADULT NUTRITION ASSESSMENT Date: 03/01/2012   Time: 10:06 AM  Reason for Assessment: Consult for new TPN  ASSESSMENT: Male 66 y.o.  Dx: s/p small bowel resection for SBO on 5/10 at [facility]; ETOH withdrawal; progressing RLL PNA; COPD exacerbation; transferred to [facility] 5/14 for treatment of acute respiratory failure, concern for possible post-op aspiration and delirium.  Hx:  Past Medical History  Diagnosis Date  . Hypertension   . Cancer   . Cancer of prostate    ETOH abuse  Related Meds:  Scheduled Meds:   . albuterol  2.5 mg Nebulization Q6H  . antiseptic oral rinse  15 mL Mouth Rinse QID  . chlorhexidine  15 mL Mouth Rinse BID  . cloNIDine  0.3 mg Transdermal Weekly  . enoxaparin  40 mg Subcutaneous Q24H  . etomidate      . folic acid  1 mg Intravenous Daily  . iohexol  20 mL Oral Q1 Hr x 2  . midazolam      . pantoprazole (PROTONIX) IV  40 mg Intravenous QHS  . piperacillin-tazobactam (ZOSYN)  IV  3.375 g Intravenous Q8H  . propofol      . sodium chloride  1,000 mL Intravenous Once  . sodium chloride  25 mL/kg Intravenous Once  . sodium chloride  500 mL Intravenous Once  . thiamine  100 mg Intravenous Daily  . vancomycin  1,250 mg Intravenous Q12H  . DISCONTD: albuterol  2.5 mg Nebulization Q4H  . DISCONTD: chlorhexidine  15 mL Mouth/Throat BID  . DISCONTD: enoxaparin  30 mg Subcutaneous Q24H  . DISCONTD: folic acid  1 mg Oral Daily  . DISCONTD: haloperidol lactate  5 mg Intramuscular Once  . DISCONTD: hydrocortisone sodium succinate  50 mg Intravenous Q6H  . DISCONTD: ipratropium  0.5 mg Nebulization Q4H  . DISCONTD: ipratropium  0.5 mg Nebulization Q6H  . DISCONTD: metoprolol      . DISCONTD: metoprolol  5 mg Intravenous Q6H  . DISCONTD: micafungin (MYCAMINE) IV  100 mg Intravenous Q24H  . DISCONTD: nicotine  14 mg Transdermal Daily  . DISCONTD: pantoprazole (PROTONIX) IV  40 mg Intravenous QHS  . DISCONTD: piperacillin-tazobactam  3.375 g Intravenous Once    . DISCONTD: vancomycin  1,000 mg Intravenous Once   Continuous Infusions:   . fentaNYL infusion INTRAVENOUS 150 mcg/hr (03/01/12 0930)  . DISCONTD: sodium chloride 100 mL/hr (02/29/12 1645)  . DISCONTD: dexmedetomidine (PRECEDEX) IV infusion for high rates Stopped (02/29/12 1600)  . DISCONTD: lactated ringers 120 mL/hr at 02/29/12 1400  . DISCONTD: propofol 30 mcg/kg/min (02/29/12 1610)  . DISCONTD: propofol 50 mcg/kg/min (02/29/12 1700)  . DISCONTD: vasopressin (PITRESSIN) infusion - *FOR SHOCK* 0.03 Units/min (02/29/12 1840)   PRN Meds:.acetaminophen, albuterol, hydrALAZINE, metoprolol, DISCONTD: sodium chloride, DISCONTD: albuterol, DISCONTD: DOBUTamine, DISCONTD: fentaNYL, DISCONTD: fentaNYL, DISCONTD: fentaNYL, DISCONTD: fentaNYL, DISCONTD: haloperidol lactate, DISCONTD: haloperidol lactate, DISCONTD: midazolam, DISCONTD: norepinephrine (LEVOPHED) Adult infusion, DISCONTD: ondansetron, DISCONTD: phenol, DISCONTD: sodium chloride DISCONTD: vasopressin (PITRESSIN) infusion - *FOR SHOCK*   Ht: 6\' 2"  (188 cm)  Wt: 189 lb 6 oz (85.9 kg)  Ideal Wt: 86.4 kg % Ideal Wt: 99%  Usual Wt: 175-185 lb % Usual Wt: 105%  Body mass index is 24.31 kg/(m^2).  Food/Nutrition Related Hx: No nutrition problems PTA per patient's family.  Has been NPO since admission to [facility] last Thursday (6 days).  Labs:  CMP     Component Value Date/Time   NA 147* 02/29/2012 1630   K 3.3* 02/29/2012 1630   CL  111 02/29/2012 1630   CO2 27 02/29/2012 1630   GLUCOSE 110* 02/29/2012 1630   BUN 28* 02/29/2012 1630   CREATININE 0.94 02/29/2012 1630   CALCIUM 7.6* 02/29/2012 1630   PROT 5.5* 02/29/2012 1630   ALBUMIN 1.9* 02/29/2012 1630   AST 32 02/29/2012 1630   ALT 27 02/29/2012 1630   ALKPHOS 45 02/29/2012 1630   BILITOT 0.7 02/29/2012 1630   GFRNONAA 86* 02/29/2012 1630   GFRAA >90 02/29/2012 1630    CBG (last 3)   Basename 03/01/12 0820 03/01/12 0357 03/01/12 0009  GLUCAP 102* 96 97     Intake/Output  Summary (Last 24 hours) at 03/01/12 1011 Last data filed at 03/01/12 1000  Gross per 24 hour  Intake 5343.28 ml  Output   5135 ml  Net 208.28 ml    Diet Order: NPO   IVF:    fentaNYL infusion INTRAVENOUS Last Rate: 150 mcg/hr (03/01/12 0930)  DISCONTD: sodium chloride Last Rate: 100 mL/hr (02/29/12 1645)  DISCONTD: dexmedetomidine (PRECEDEX) IV infusion for high rates Last Rate: Stopped (02/29/12 1600)  DISCONTD: lactated ringers Last Rate: 120 mL/hr at 02/29/12 1400  DISCONTD: propofol Last Rate: 30 mcg/kg/min (02/29/12 1610)  DISCONTD: propofol Last Rate: 50 mcg/kg/min (02/29/12 1700)  DISCONTD: vasopressin (PITRESSIN) infusion - *FOR SHOCK* Last Rate: 0.03 Units/min (02/29/12 1840)    Estimated Nutritional Needs:   Kcal: 2065 Protein: 105-120 grams Fluid: 2-2.2 liters  Patient is at nutrition risk given NPO status since admission (6 days) and current inability to eat.  Patient with post-op ileus.  Plans to start TPN via central line today.  Patient was just extubated this morning.  NUTRITION DIAGNOSIS: -Inadequate oral intake (NI-2.1).  Status: Ongoing  RELATED TO: inability to eat with altered GI function  AS EVIDENCED BY: NPO status since admission  MONITORING/EVALUATION(Goals):  Goal:  Intake to meet 90-100% of estimated nutrition needs.  Monitor:  TPN tolerance, labs, weight trend.  EDUCATION NEEDS: -Education not appropriate at this time  INTERVENTION:  TPN per Pharmacy.  May be unable to meet nutrition goals with national lipid backorder.  RD to assist with transition to enteral nutrition as patient is able.  Dietitian #:  [phone]  DOCUMENTATION CODES Per approved criteria  -Not Applicable    Hettie Holstein 03/01/2012, 10:06 AM

## 2012-03-01 NOTE — Progress Notes (Signed)
PARENTERAL NUTRITION CONSULT NOTE - INITIAL  Pharmacy Consult for TPN Indication: Prolonged ileus s/p SBR for SBO 5/10  Allergies  Allergen Reactions  . Lorazepam Other (See Comments)    Severe agitated delirium. Tolerates midazolam and other benzo's    Patient Measurements: Height: 6\' 2"  (188 cm) Weight: 189 lb 6 oz (85.9 kg) IBW/kg (Calculated) : 82.2  Usual Weight: ~82 kg  Vital Signs: Temp: 98.6 F (37 C) (05/15 1144) Temp src: Oral (05/15 1144) BP: 117/78 mmHg (05/15 1200) Pulse Rate: 110  (05/15 1200) Intake/Output from previous day: 05/14 0701 - 05/15 0700 In: 5724.7 [P.O.:320; I.V.:2988.7; NG/GT:650; IV Piggyback:1766] Out: 4825 [Urine:1575; Emesis/NG output:3250] Intake/Output from this shift: Total I/O In: 405.6 [I.V.:405.6] Out: 385 [Urine:385]  Labs:  [facility] 03/01/12 0500 02/29/12 1630 02/29/12 0457  WBC 12.5* 13.4* 14.0*  HGB 12.0* 12.4* 13.9  HCT 35.4* 36.0* 41.1  PLT 142* 122* 162  APTT -- 21* --  INR -- 1.16 --     Basename 03/01/12 1223 02/29/12 1630 02/29/12 0457  NA 147* 147* 143  K 4.1 3.3* 3.4*  CL 112 111 105  CO2 26 27 28   GLUCOSE 97 110* 114*  BUN 21 28* 30*  CREATININE 0.73 0.94 1.01  LABCREA -- -- --  CREAT24HRUR -- -- --  CALCIUM 7.9* 7.6* 8.6  MG 2.3 -- --  PHOS 2.3 -- --  PROT -- 5.5* --  ALBUMIN -- 1.9* --  AST -- 32 --  ALT -- 27 --  ALKPHOS -- 45 --  BILITOT -- 0.7 --  BILIDIR -- -- --  IBILI -- -- --  PREALBUMIN -- -- --  TRIG -- -- --  CHOLHDL -- -- --  CHOL -- -- --   Estimated Creatinine Clearance: 108.5 ml/min (by C-G formula based on Cr of 0.73).    Basename 03/01/12 1142 03/01/12 0820 03/01/12 0357  GLUCAP 99 102* 96    Medical History: Past Medical History  Diagnosis Date  . Hypertension   . Cancer   . Cancer of prostate     Medications:  Prescriptions prior to admission  Medication Sig Dispense Refill  . amLODipine (NORVASC) 5 MG tablet Take 5 mg by mouth daily.      Marland Kitchen imipramine  (TOFRANIL) 25 MG tablet Take 25 mg by mouth at bedtime.      Marland Kitchen lisinopril (PRINIVIL,ZESTRIL) 20 MG tablet Take 20 mg by mouth daily.        Insulin Requirements in the past 24 hours:  None  Nutritional Goals:  2065 kCal, 105-120 grams of protein per day  Current Nutrition:  NPO  Assessment: 66 y.o. male to begin TPN for prolonged ileus s/p SBR for SBO on 5/10 at [facility]. Pt NPO x 6 days. Noted PMH of alcohol abuse.  GI: SBO s/p resection 5/10 at [facility], ileus on bowel rest. NPO x 6 days. NGT o/p 3250 yesterday.  Endo: No h/o DM. CBGs within goal. No SSI currently on profile.  Lytes:  Na slightly high. Others wnl. Calcium corrects to 9.6 for decreased albumin 1.9.  Renal: Creat stable. UOP 0.8 ml/kg/hr.  Pulm: Pt extubated this a.m.   Cards:  hx HTN - BP ok, tachycardia 110's - clonidine patch, lopressor PRN  Hepatobil: LFTs wnl at baseline  Neuro: acute encephalopathy (ativan allergy), on fent gtt  ID: Vanc D#2 + Zosyn D#3 for PNA, presumed aspiration, afebrile, WBC trending down.  Best Practices:  Lovenox, PPI IV  Plan:  1. Begin clinimix  E 5/15 at 52ml/hr and lipids at 34ml/hr on M/W/F. Will advance to Clinimix 5/20 with goal rate of 83 ml/min and lipids M/W/F to meet 95% kcal needs and 95% protein needs 2. Will give MVI/trace elements/lipids on M/W/F due to national shortage. 3. Will put IV thiamine and folic acid in daily TPN 4. Will add sensitive SSI q6h 4. F/u TPN labs  Christoper Fabian, PharmD, BCPS Clinical pharmacist, pager [phone] 03/01/2012,1:10 PM

## 2012-03-01 NOTE — Consult Note (Signed)
Michael Ellison 02/11/1946  [id number].   Requesting MD: Dr. Billy Fischer Chief Complaint/Reason for Consult: post surgical management HPI:  This is a 66 yo male who presented to [facility] several days ago with a SBO.  He did not resolve and ultimately underwent an exploratory laparotomy with SBR on 02-25-12.  Postoperatively, he went in ARDS and required intubation.  The family requested the patient be transferred to [facility] for further care.  We have been asked to evaluate the patient for postoperative management.  He has just been extubated, but is still sedated.  He does not provide any history.  Review of Systems: Unable to obtain due to sedated state.  Family History  Problem Relation Age of Onset  . Pseudochol deficiency Neg Hx   . Anesthesia problems Neg Hx   . Hypotension Neg Hx   . Malignant hyperthermia Neg Hx     Past Medical History  Diagnosis Date  . Hypertension   . Cancer   . Cancer of prostate     Past Surgical History  Procedure Date  . Hernia repair   . Prostate biopsy   . Robotic prostate surgery 2011  . Laparotomy 02/25/2012    Procedure: EXPLORATORY LAPAROTOMY;  Surgeon: [name], MD;  Location: [facility];  Service: General;  Laterality: N/A;  . Bowel resection 02/25/2012    Procedure: SMALL BOWEL RESECTION;  Surgeon: [name], MD;  Location: [facility];  Service: General;;    Social History:  reports that he has been smoking.  He does not have any smokeless tobacco history on file. He reports that he drinks alcohol. He reports that he does not use illicit drugs.  Allergies:  Allergies  Allergen Reactions  . Lorazepam Other (See Comments)    Severe agitated delirium. Tolerates midazolam and other benzo's    Medications Prior to Admission  Medication Sig Dispense Refill  . amLODipine (NORVASC) 5 MG tablet Take 5 mg by mouth daily.      Marland Kitchen imipramine (TOFRANIL) 25 MG tablet Take 25 mg by mouth at bedtime.      Marland Kitchen lisinopril (PRINIVIL,ZESTRIL) 20 MG tablet  Take 20 mg by mouth daily.        Blood pressure 117/78, pulse 110, temperature 98.6 F (37 C), temperature source Oral, resp. rate 25, height 6\' 2"  (1.88 m), weight 189 lb 6 oz (85.9 kg), SpO2 95.00%. Physical Exam: General: black male who is laying in bed and sedated. HEENT: head is normocephalic, atraumatic.  Sclera are noninjected.  Ears and nose without any masses or lesions, NGT with bilious output.  venti-mask in place. Heart: regular, rhythm, but tachycardic.  Normal s1,s2. No obvious murmurs, gallops, or rubs noted.  Palpable radial and pedal pulses bilaterally Lungs: diffuse rhonchi.  Using abdominal muscle to assist with respirations. Abd: soft, tenderness in unable to be examined due to sedation, -BS, some distention.  Incisional dressing removed.  His whole incision is erythematous and warm.  All staples were removed.  A good deal of tan purulent malodorous drainage was evacuated.  Very suspicious for superior wound dehiscence with bowel (likely colon based off of CT scan) palpable.  This wound was packed lightly with a normal saline wet to dry dressing. MS: all 4 extremities are symmetrical with no cyanosis, clubbing, or edema. Skin: warm and dry with no masses, lesions, or rashes Psych: patient is sedated.  Unable to assess mental status.    Results for orders placed during the hospital encounter of 02/24/12 (from  the past 48 hour(s))  BASIC METABOLIC PANEL     Status: Abnormal   Collection Time   02/29/12  4:57 AM      Component Value Range Comment   Sodium 143  135 - 145 (mEq/L)    Potassium 3.4 (*) 3.5 - 5.1 (mEq/L)    Chloride 105  96 - 112 (mEq/L)    CO2 28  19 - 32 (mEq/L)    Glucose, Bld 114 (*) 70 - 99 (mg/dL)    BUN 30 (*) 6 - 23 (mg/dL) DELTA CHECK NOTED   Creatinine, Ser 1.01  0.50 - 1.35 (mg/dL)    Calcium 8.6  8.4 - 10.5 (mg/dL)    GFR calc non Af Amer 77 (*) >90 (mL/min)    GFR calc Af Amer 89 (*) >90 (mL/min)   CBC     Status: Abnormal   Collection Time     02/29/12  4:57 AM      Component Value Range Comment   WBC 14.0 (*) 4.0 - 10.5 (K/uL)    RBC 4.57  4.22 - 5.81 (MIL/uL)    Hemoglobin 13.9  13.0 - 17.0 (g/dL)    HCT 16.1  09.6 - 04.5 (%)    MCV 89.9  78.0 - 100.0 (fL)    MCH 30.4  26.0 - 34.0 (pg)    MCHC 33.8  30.0 - 36.0 (g/dL)    RDW 40.9  81.1 - 91.4 (%)    Platelets 162  150 - 400 (K/uL)   BLOOD GAS, ARTERIAL     Status: Abnormal   Collection Time   02/29/12  5:00 AM      Component Value Range Comment   FIO2 50.00      Delivery systems OXYGEN MASK      pH, Arterial 7.461 (*) 7.350 - 7.450     pCO2 arterial 37.4  35.0 - 45.0 (mmHg)    pO2, Arterial 58.8 (*) 80.0 - 100.0 (mmHg)    Bicarbonate 26.3 (*) 20.0 - 24.0 (mEq/L)    TCO2 22.7  0 - 100 (mmol/L)    Acid-Base Excess 2.7 (*) 0.0 - 2.0 (mmol/L)    O2 Saturation 90.3      Collection site RIGHT RADIAL      Drawn by [id number]      Sample type ARTERIAL      Allens test (pass/fail) PASS  PASS    WOUND CULTURE     Status: Normal (Preliminary result)   Collection Time   02/29/12  9:22 AM      Component Value Range Comment   Specimen Description WOUND ABDOMEN INCISION SITE      Special Requests NONE      Gram Stain        Value: FEW WBC PRESENT,BOTH PMN AND MONONUCLEAR     NO SQUAMOUS EPITHELIAL CELLS SEEN     RARE GRAM POSITIVE RODS     FEW GRAM NEGATIVE RODS   Culture Culture reincubated for better growth      Report Status PENDING     GLUCOSE, CAPILLARY     Status: Abnormal   Collection Time   02/29/12  3:20 PM      Component Value Range Comment   Glucose-Capillary 101 (*) 70 - 99 (mg/dL)   MRSA PCR SCREENING     Status: Normal   Collection Time   02/29/12  4:15 PM      Component Value Range Comment   MRSA by PCR NEGATIVE  NEGATIVE  CBC     Status: Abnormal   Collection Time   02/29/12  4:30 PM      Component Value Range Comment   WBC 13.4 (*) 4.0 - 10.5 (K/uL)    RBC 4.07 (*) 4.22 - 5.81 (MIL/uL)    Hemoglobin 12.4 (*) 13.0 - 17.0 (g/dL)    HCT 16.1 (*) 09.6 -  52.0 (%)    MCV 88.5  78.0 - 100.0 (fL)    MCH 30.5  26.0 - 34.0 (pg)    MCHC 34.4  30.0 - 36.0 (g/dL)    RDW 04.5  40.9 - 81.1 (%)    Platelets 122 (*) 150 - 400 (K/uL)   COMPREHENSIVE METABOLIC PANEL     Status: Abnormal   Collection Time   02/29/12  4:30 PM      Component Value Range Comment   Sodium 147 (*) 135 - 145 (mEq/L)    Potassium 3.3 (*) 3.5 - 5.1 (mEq/L)    Chloride 111  96 - 112 (mEq/L)    CO2 27  19 - 32 (mEq/L)    Glucose, Bld 110 (*) 70 - 99 (mg/dL)    BUN 28 (*) 6 - 23 (mg/dL)    Creatinine, Ser 9.14  0.50 - 1.35 (mg/dL)    Calcium 7.6 (*) 8.4 - 10.5 (mg/dL)    Total Protein 5.5 (*) 6.0 - 8.3 (g/dL)    Albumin 1.9 (*) 3.5 - 5.2 (g/dL)    AST 32  0 - 37 (U/L)    ALT 27  0 - 53 (U/L)    Alkaline Phosphatase 45  39 - 117 (U/L)    Total Bilirubin 0.7  0.3 - 1.2 (mg/dL)    GFR calc non Af Amer 86 (*) >90 (mL/min)    GFR calc Af Amer >90  >90 (mL/min)   LACTIC ACID, PLASMA     Status: Normal   Collection Time   02/29/12  4:30 PM      Component Value Range Comment   Lactic Acid, Venous 1.3  0.5 - 2.2 (mmol/L)   CORTISOL     Status: Normal   Collection Time   02/29/12  4:30 PM      Component Value Range Comment   Cortisol, Plasma 23.2     PROCALCITONIN     Status: Normal   Collection Time   02/29/12  4:30 PM      Component Value Range Comment   Procalcitonin 52.60     CARDIAC PANEL(CRET KIN+CKTOT+MB+TROPI)     Status: Abnormal   Collection Time   02/29/12  4:30 PM      Component Value Range Comment   Total CK 218  7 - 232 (U/L)    CK, MB 5.5 (*) 0.3 - 4.0 (ng/mL)    Troponin I <0.30  <0.30 (ng/mL)    Relative Index 2.5  0.0 - 2.5    PROTIME-INR     Status: Normal   Collection Time   02/29/12  4:30 PM      Component Value Range Comment   Prothrombin Time 15.0  11.6 - 15.2 (seconds)    INR 1.16  0.00 - 1.49    APTT     Status: Abnormal   Collection Time   02/29/12  4:30 PM      Component Value Range Comment   aPTT 21 (*) 24 - 37 (seconds)   FIBRINOGEN      Status: Abnormal   Collection Time   02/29/12  4:30 PM  Component Value Range Comment   Fibrinogen 668 (*) 204 - 475 (mg/dL)   TYPE AND SCREEN     Status: Normal   Collection Time   02/29/12  4:35 PM      Component Value Range Comment   ABO/RH(D) A POS      Antibody Screen NEG      Sample Expiration 03/03/2012     ABO/RH     Status: Normal   Collection Time   02/29/12  4:35 PM      Component Value Range Comment   ABO/RH(D) A POS     URINALYSIS, ROUTINE W REFLEX MICROSCOPIC     Status: Abnormal   Collection Time   02/29/12  5:03 PM      Component Value Range Comment   Color, Urine YELLOW  YELLOW     APPearance HAZY (*) CLEAR     Specific Gravity, Urine 1.025  1.005 - 1.030     pH 6.0  5.0 - 8.0     Glucose, UA 100 (*) NEGATIVE (mg/dL)    Hgb urine dipstick MODERATE (*) NEGATIVE     Bilirubin Urine SMALL (*) NEGATIVE     Ketones, ur 15 (*) NEGATIVE (mg/dL)    Protein, ur 161 (*) NEGATIVE (mg/dL)    Urobilinogen, UA 0.2  0.0 - 1.0 (mg/dL)    Nitrite NEGATIVE  NEGATIVE     Leukocytes, UA SMALL (*) NEGATIVE    URINE MICROSCOPIC-ADD ON     Status: Abnormal   Collection Time   02/29/12  5:03 PM      Component Value Range Comment   Squamous Epithelial / LPF FEW (*) RARE     WBC, UA 3-6  <3 (WBC/hpf)    RBC / HPF 3-6  <3 (RBC/hpf)    Bacteria, UA FEW (*) RARE     Casts GRANULAR CAST (*) NEGATIVE     Crystals URIC ACID CRYSTALS (*) NEGATIVE     Urine-Other AMORPHOUS URATES/PHOSPHATES     CARBOXYHEMOGLOBIN     Status: Abnormal   Collection Time   02/29/12  5:20 PM      Component Value Range Comment   Total hemoglobin 13.8  13.5 - 18.0 (g/dL)    O2 Saturation 09.6      Carboxyhemoglobin 1.6 (*) 0.5 - 1.5 (%)    Methemoglobin 1.7 (*) 0.0 - 1.5 (%)   POCT I-STAT 3, BLOOD GAS (G3+)     Status: Abnormal   Collection Time   02/29/12  5:56 PM      Component Value Range Comment   pH, Arterial 7.343 (*) 7.350 - 7.450     pCO2 arterial 42.3  35.0 - 45.0 (mmHg)    pO2, Arterial 87.0   80.0 - 100.0 (mmHg)    Bicarbonate 22.9  20.0 - 24.0 (mEq/L)    TCO2 24  0 - 100 (mmol/L)    O2 Saturation 96.0      Acid-base deficit 3.0 (*) 0.0 - 2.0 (mmol/L)    Patient temperature 37.0 C      Collection site RADIAL, ALLEN'S TEST ACCEPTABLE      Drawn by RT      Sample type ARTERIAL     CARBOXYHEMOGLOBIN     Status: Abnormal   Collection Time   02/29/12  6:52 PM      Component Value Range Comment   Total hemoglobin 12.6 (*) 13.5 - 18.0 (g/dL)    O2 Saturation 04.5      Carboxyhemoglobin 1.6 (*) 0.5 -  1.5 (%)    Methemoglobin 1.4  0.0 - 1.5 (%)   CULTURE, RESPIRATORY     Status: Normal (Preliminary result)   Collection Time   02/29/12  7:22 PM      Component Value Range Comment   Specimen Description ENDOTRACHEAL      Special Requests NONE      Gram Stain        Value: FEW WBC PRESENT,BOTH PMN AND MONONUCLEAR     NO SQUAMOUS EPITHELIAL CELLS SEEN     NO ORGANISMS SEEN   Culture PENDING      Report Status PENDING     GLUCOSE, CAPILLARY     Status: Abnormal   Collection Time   02/29/12  7:51 PM      Component Value Range Comment   Glucose-Capillary 111 (*) 70 - 99 (mg/dL)   GLUCOSE, CAPILLARY     Status: Normal   Collection Time   03/01/12 12:09 AM      Component Value Range Comment   Glucose-Capillary 97  70 - 99 (mg/dL)   GLUCOSE, CAPILLARY     Status: Normal   Collection Time   03/01/12  3:57 AM      Component Value Range Comment   Glucose-Capillary 96  70 - 99 (mg/dL)   POCT I-STAT 3, BLOOD GAS (G3+)     Status: Abnormal   Collection Time   03/01/12  4:40 AM      Component Value Range Comment   pH, Arterial 7.389  7.350 - 7.450     pCO2 arterial 46.0 (*) 35.0 - 45.0 (mmHg)    pO2, Arterial 63.0 (*) 80.0 - 100.0 (mmHg)    Bicarbonate 27.7 (*) 20.0 - 24.0 (mEq/L)    TCO2 29  0 - 100 (mmol/L)    O2 Saturation 91.0      Acid-Base Excess 2.0  0.0 - 2.0 (mmol/L)    Patient temperature 99.2 F      Collection site FEMORAL ARTERY      Drawn by Nurse      Sample type  ARTERIAL     CBC     Status: Abnormal   Collection Time   03/01/12  5:00 AM      Component Value Range Comment   WBC 12.5 (*) 4.0 - 10.5 (K/uL)    RBC 3.92 (*) 4.22 - 5.81 (MIL/uL)    Hemoglobin 12.0 (*) 13.0 - 17.0 (g/dL)    HCT 40.9 (*) 81.1 - 52.0 (%)    MCV 90.3  78.0 - 100.0 (fL)    MCH 30.6  26.0 - 34.0 (pg)    MCHC 33.9  30.0 - 36.0 (g/dL)    RDW 91.4  78.2 - 95.6 (%)    Platelets 142 (*) 150 - 400 (K/uL)   GLUCOSE, CAPILLARY     Status: Abnormal   Collection Time   03/01/12  8:20 AM      Component Value Range Comment   Glucose-Capillary 102 (*) 70 - 99 (mg/dL)   GLUCOSE, CAPILLARY     Status: Normal   Collection Time   03/01/12 11:42 AM      Component Value Range Comment   Glucose-Capillary 99  70 - 99 (mg/dL)   BASIC METABOLIC PANEL     Status: Abnormal   Collection Time   03/01/12 12:23 PM      Component Value Range Comment   Sodium 147 (*) 135 - 145 (mEq/L)    Potassium 4.1  3.5 - 5.1 (mEq/L) DELTA  CHECK NOTED   Chloride 112  96 - 112 (mEq/L)    CO2 26  19 - 32 (mEq/L)    Glucose, Bld 97  70 - 99 (mg/dL)    BUN 21  6 - 23 (mg/dL)    Creatinine, Ser 1.61  0.50 - 1.35 (mg/dL)    Calcium 7.9 (*) 8.4 - 10.5 (mg/dL)    GFR calc non Af Amer >90  >90 (mL/min)    GFR calc Af Amer >90  >90 (mL/min)   MAGNESIUM     Status: Normal   Collection Time   03/01/12 12:23 PM      Component Value Range Comment   Magnesium 2.3  1.5 - 2.5 (mg/dL)   PHOSPHORUS     Status: Normal   Collection Time   03/01/12 12:23 PM      Component Value Range Comment   Phosphorus 2.3  2.3 - 4.6 (mg/dL)    Ct Abdomen Pelvis Wo Contrast  03/01/2012  *RADIOLOGY REPORT*  Clinical Data:  Post bowel resection for bowel obstruction. Aspiration.  Evaluate for leak.  Evaluate infiltrates.  The  CT CHEST, ABDOMEN AND PELVIS WITHOUT CONTRAST  Technique:  Multidetector CT imaging of the chest, abdomen and pelvis was performed following the standard protocol without IV contrast.  Comparison:  CT abdomen and  pelvis 02/24/2012.  CT CHEST  Findings:  Endotracheal and enteric tubes are in place.  Left central venous catheter with tip in the SVC.  Normal caliber thoracic aorta.  Prominent central pulmonary vessels suggesting congestion arterial hypertension. Small bilateral pleural effusions with basilar atelectasis or consolidation bilaterally.  Extensive emphysematous changes in the lungs with bullous changes in both apices.  Scarring in the apices bilaterally.  Small ground-glass nodule in the right middle lung measuring about 4 mm diameter may represent infiltrative process.  No pneumothorax.  Cardiac enlargement.  No significant lymphadenopathy in the chest. Degenerative changes in the thoracic spine.  Schmorl's node at T12.  IMPRESSION: Cardiac enlargement with pulmonary vascular congestion.  Bilateral pleural effusions with basilar atelectasis or consolidation.  Small ground-glass nodule in the right mid lung.  CT ABDOMEN AND PELVIS  Findings:  Mild persistent distension of the stomach, duodenum, and proximal jejunum.  Distal bowel are decompressed.  Residual obstruction is not excluded although changes could represent ileus. Transition zone appears to be distal to the region of the anastomoses. Nonspecific wall thickening and seven mid abdominal jejunal loops.  Contrast material extends to the region of the proximal jejunum.  No distal contrast material.  No evidence of contrast extravasation.  No significant free fluid.  No free intra- abdominal air.  Diffusely increased density throughout the subcutaneous and intra-abdominal fat consistent with edema.  The unenhanced appearance of the liver, spleen, gallbladder, pancreas, and adrenal glands is unremarkable.  Calcification of abdominal aorta without aneurysm.  Low attenuation lesions in the kidneys consistent with cysts as demonstrated on the previous study.  No retroperitoneal lymphadenopathy.  Pelvis:  Central venous catheter in the right femoral vein.  Small  amount of free fluid in the pelvis.  No free air.  No loculated fluid.  The colon is decompressed.  Diverticula are present in the sigmoid region without inflammatory change.  Appendix is not well visualized.  The degenerative changes in the spine.  IMPRESSION: Residual dilatation of stomach and proximal small bowel with decompressed distal small bowel and colon.  Changes could represent ileus or residual obstruction.  Dilatation extends distal to the area of the anastomoses.  Nonspecific wall thickening of jejunal loops may represent edema.  No free air or free fluid to suggest perforation.  Original Report Authenticated By: Marlon Pel, M.D.   Ct Chest Wo Contrast  03/01/2012  *RADIOLOGY REPORT*  Clinical Data:  Post bowel resection for bowel obstruction. Aspiration.  Evaluate for leak.  Evaluate infiltrates.  The  CT CHEST, ABDOMEN AND PELVIS WITHOUT CONTRAST  Technique:  Multidetector CT imaging of the chest, abdomen and pelvis was performed following the standard protocol without IV contrast.  Comparison:  CT abdomen and pelvis 02/24/2012.  CT CHEST  Findings:  Endotracheal and enteric tubes are in place.  Left central venous catheter with tip in the SVC.  Normal caliber thoracic aorta.  Prominent central pulmonary vessels suggesting congestion arterial hypertension. Small bilateral pleural effusions with basilar atelectasis or consolidation bilaterally.  Extensive emphysematous changes in the lungs with bullous changes in both apices.  Scarring in the apices bilaterally.  Small ground-glass nodule in the right middle lung measuring about 4 mm diameter may represent infiltrative process.  No pneumothorax.  Cardiac enlargement.  No significant lymphadenopathy in the chest. Degenerative changes in the thoracic spine.  Schmorl's node at T12.  IMPRESSION: Cardiac enlargement with pulmonary vascular congestion.  Bilateral pleural effusions with basilar atelectasis or consolidation.  Small ground-glass  nodule in the right mid lung.  CT ABDOMEN AND PELVIS  Findings:  Mild persistent distension of the stomach, duodenum, and proximal jejunum.  Distal bowel are decompressed.  Residual obstruction is not excluded although changes could represent ileus. Transition zone appears to be distal to the region of the anastomoses. Nonspecific wall thickening and seven mid abdominal jejunal loops.  Contrast material extends to the region of the proximal jejunum.  No distal contrast material.  No evidence of contrast extravasation.  No significant free fluid.  No free intra- abdominal air.  Diffusely increased density throughout the subcutaneous and intra-abdominal fat consistent with edema.  The unenhanced appearance of the liver, spleen, gallbladder, pancreas, and adrenal glands is unremarkable.  Calcification of abdominal aorta without aneurysm.  Low attenuation lesions in the kidneys consistent with cysts as demonstrated on the previous study.  No retroperitoneal lymphadenopathy.  Pelvis:  Central venous catheter in the right femoral vein.  Small amount of free fluid in the pelvis.  No free air.  No loculated fluid.  The colon is decompressed.  Diverticula are present in the sigmoid region without inflammatory change.  Appendix is not well visualized.  The degenerative changes in the spine.  IMPRESSION: Residual dilatation of stomach and proximal small bowel with decompressed distal small bowel and colon.  Changes could represent ileus or residual obstruction.  Dilatation extends distal to the area of the anastomoses.  Nonspecific wall thickening of jejunal loops may represent edema.  No free air or free fluid to suggest perforation.  Original Report Authenticated By: Marlon Pel, M.D.   Portable Chest Xray In Am  03/01/2012  *RADIOLOGY REPORT*  Clinical Data: Intubated patient.  PORTABLE CHEST - 1 VIEW  Comparison: Plain film chest CT chest 02/29/2012.  Findings: Support tubes and lines are unchanged.  Right worse  than left basilar airspace disease persists, also without change.  Lungs are emphysematous.  Heart size upper normal.  IMPRESSION: No interval change.  Original Report Authenticated By: Bernadene Bell. Maricela Curet, M.D.   Portable Chest Xray  02/29/2012  *RADIOLOGY REPORT*  Clinical Data: Endotracheal tube placement.  Left subclavian line placement.  PORTABLE CHEST - 1 VIEW  Comparison: Portable  chest 02/29/2012.  Findings: The the patient is now intubated.  Endotracheal tube terminates 4 cm above the carina, in satisfactory position.  A left subclavian line is in place.  The tip is at the cavoatrial junction.  Biapical pleural blebs are evident without evidence for pneumothorax.  Right lower lobe airspace disease is increasing. Left basilar atelectasis is stable.  Moderate pulmonary vascular congestion is slightly worse than on the prior exam.  IMPRESSION:  1.  Satisfactory positioning of the endotracheal tube and left subclavian line. 2.  Increasing right basilar airspace disease is worrisome for infection. 3.  Increasing pulmonary vascular congestion.  Original Report Authenticated By: Jamesetta Orleans. MATTERN, M.D.   Dg Chest Port 1 View  02/29/2012  *RADIOLOGY REPORT*  Clinical Data: Respiratory failure  PORTABLE CHEST - 1 VIEW  Comparison: 02/28/2012  Findings: Shallow inspiration.  Left lower lobe collapse. Atelectasis or infiltration in the right lung base.  Emphysematous changes in the lungs.  Bilateral upper lobe bullae.  Mild cardiac enlargement with normal pulmonary vascularity.  Calcification of the aorta.  Prominent stomach bubble.  No significant changes since the previous study.  IMPRESSION: Left lower lobe collapse.  Atelectasis or infiltration in the right lower lung.  Shallow inspiration.  Emphysematous changes.  Stable appearance since previous study.  Original Report Authenticated By: Marlon Pel, M.D.   Dg Chest Port 1 View  02/28/2012  *RADIOLOGY REPORT*  Clinical Data: Difficulty  breathing  PORTABLE CHEST - 1 VIEW  Comparison: 02/26/2012; 10/07/2004; 04/04/2004; CT abdomen pelvis - 02/24/2012  Findings: Grossly unchanged cardiac silhouette and mediastinal contours given persistently reduced lung volumes.  Interval removal of enteric tube.  Geographic consolidative retrocardiac opacity. Unchanged to minimally worsened right basilar heterogeneous opacities.  Query small bilateral effusions.  Emphysematous change within the bilateral lung apices without definite pneumothorax. Grossly unchanged bones.  IMPRESSION: Worsening left lower lobe atelectasis/collapse, with unchanged to minimally worsened right lower lung heterogeneous air space opacities, atelectasis versus infiltrate.  Further evaluation with a PA and lateral chest radiograph may be obtained as clinically indicated.  Original Report Authenticated By: Waynard Reeds, M.D.   Dg Abd Portable 1v  02/29/2012  *RADIOLOGY REPORT*  Clinical Data: Abdominal distention, possible ileus  PORTABLE ABDOMEN - 1 VIEW  Comparison: CT abdomen pelvis of 02/24/2012  Findings: Both large and small bowel gas is present most consistent with postoperative ileus.  Surgical staples are noted along the midline.  No bony abnormality is seen.  IMPRESSION: Probable postoperative ileus.  Original Report Authenticated By: Juline Patch, M.D.       Assessment/Plan 1. S/p ex lap with SBR, POD# 4 2. Wound infection with likely fascial dehiscence with bowel visible  3. ARDS 4. ARF, just extubated 5. Post op ileus  Plan: 1. Agree with bowel rest and NGT management for post op ileus.  We would continue this until he has bowel function.  Dr. Sung Amabile said he was going to start TNA, which I agree with as I think it will likely be a while before he can take oral feeds.  He is covered right now with Vancomycin and Zosyn.  He did have micofungin ordered, but this will be stopped by CCM as there do not seem to be any acute indications for this.   2. We will  start NS WD dressing changes BID to his abdominal wound.  I have shown the nurse what the wound looks like and where there is a concern for exposed bowel so they  can be extra careful with this site during dressing changes.  There is no evisceration or evidence of fistula, although the patient is now at significant risk for both of these problems.  In addition to dressing changes, I have ordered an abdominal binder to give him as much extra support as possible since he is using his abdominal muscles quite a bit to breath right now.   3. We will follow along with you.  Further medical care per primary service. Klarisa Barman E 03/01/2012, 1:08 PM

## 2012-03-01 NOTE — Clinical Social Work Psychosocial (Signed)
     Clinical Social Work Department BRIEF PSYCHOSOCIAL ASSESSMENT 03/01/2012  Patient:  Michael Ellison, Michael Ellison     Account Number:  [id number]     Admit date:  02/24/2012  Clinical Social Worker:  Margaree Mackintosh  Date/Time:  03/01/2012 12:24 PM  Referred by:  RN  Date Referred:  03/01/2012 Referred for  Other - See comment   Other Referral:   FMLA paperwork.   Interview type:  Family Other interview type:    PSYCHOSOCIAL DATA Living Status:  ALONE Admitted from facility:   Level of care:   Primary support name:  [phone] Primary support relationship to patient:  CHILD, ADULT Degree of support available:   Adequate.    CURRENT CONCERNS Current Concerns  Other - See comment   Other Concerns:   FMLA paperwork.    SOCIAL WORK ASSESSMENT / PLAN Clinical Social Worker recieved notification from [employer] that pt's dtrs had questions regarding FMLA paperwork and requested to speak with CSW.  CSW introduced self and explained role.  CSW provided support and validated dtrs' feelings of fear/anxiety that pt is currently in the hospital.  CSW offered to fax FMLA paperwork to [facility] office as they have staff there to assist with completing this paperwork.  Dtrs agreeable.  CSW submitted this papework to office, phoned office to explain situation, and provided copy of paperwork to dtrs.  CSW to sign off at this time, please re consult if needed.   Assessment/plan status:  No Further Intervention Required Other assessment/ plan:   Information/referral to community resources:    PATIENTS/FAMILYS RESPONSE TO PLAN OF CARE: Dtrs stated they would retrieve paperwork from pt's room as they were on their way to lunch.

## 2012-03-02 ENCOUNTER — Encounter (HOSPITAL_COMMUNITY): Payer: Self-pay | Admitting: Anesthesiology

## 2012-03-02 ENCOUNTER — Inpatient Hospital Stay (HOSPITAL_COMMUNITY): Payer: Medicare Other | Admitting: Anesthesiology

## 2012-03-02 ENCOUNTER — Encounter (HOSPITAL_COMMUNITY)
Admission: EM | Disposition: A | Discharge status: Skilled Nursing Facility | Payer: Self-pay | Source: Ambulatory Visit | Attending: Pulmonary Disease

## 2012-03-02 ENCOUNTER — Inpatient Hospital Stay (HOSPITAL_COMMUNITY): Payer: Medicare Other

## 2012-03-02 HISTORY — PX: WOUND DEBRIDEMENT: SHX247

## 2012-03-02 LAB — CBC
HCT: 41.5 % (ref 39.0–52.0)
MCV: 89.4 fL (ref 78.0–100.0)
RBC: 4.64 MIL/uL (ref 4.22–5.81)
RDW: 14.8 % (ref 11.5–15.5)
WBC: 20.4 10*3/uL — ABNORMAL HIGH (ref 4.0–10.5)

## 2012-03-02 LAB — DIFFERENTIAL
Eosinophils Absolute: 0 10*3/uL (ref 0.0–0.7)
Eosinophils Relative: 0 % (ref 0–5)
Lymphocytes Relative: 8 % — ABNORMAL LOW (ref 12–46)
Monocytes Absolute: 1.6 10*3/uL — ABNORMAL HIGH (ref 0.1–1.0)
Neutrophils Relative %: 83 % — ABNORMAL HIGH (ref 43–77)

## 2012-03-02 LAB — COMPREHENSIVE METABOLIC PANEL
AST: 25 U/L (ref 0–37)
Albumin: 2.1 g/dL — ABNORMAL LOW (ref 3.5–5.2)
BUN: 20 mg/dL (ref 6–23)
Creatinine, Ser: 0.79 mg/dL (ref 0.50–1.35)
Potassium: 3.4 mEq/L — ABNORMAL LOW (ref 3.5–5.1)
Total Protein: 6.1 g/dL (ref 6.0–8.3)

## 2012-03-02 LAB — GLUCOSE, CAPILLARY

## 2012-03-02 LAB — PRO B NATRIURETIC PEPTIDE: Pro B Natriuretic peptide (BNP): 3782 pg/mL — ABNORMAL HIGH (ref 0–125)

## 2012-03-02 LAB — PHOSPHORUS: Phosphorus: 1.6 mg/dL — ABNORMAL LOW (ref 2.3–4.6)

## 2012-03-02 LAB — PREALBUMIN: Prealbumin: <3 mg/dL — ABNORMAL LOW (ref 17.0–34.0)

## 2012-03-02 LAB — MAGNESIUM: Magnesium: 1.9 mg/dL (ref 1.5–2.5)

## 2012-03-02 SURGERY — IRRIGATION AND DEBRIDEMENT, WOUND, ABDOMEN, WITH CLOSURE
Anesthesia: General | Site: Abdomen | Wound class: Dirty or Infected

## 2012-03-02 MED ORDER — FENTANYL CITRATE 0.05 MG/ML IJ SOLN
INTRAMUSCULAR | Status: DC | PRN
Start: 2012-03-02 — End: 2012-03-02
  Administered 2012-03-02 (×2): 100 ug via INTRAVENOUS

## 2012-03-02 MED ORDER — VECURONIUM BROMIDE 10 MG IV SOLR
INTRAVENOUS | Status: DC | PRN
  Administered 2012-03-02: 5 mg via INTRAVENOUS

## 2012-03-02 MED ORDER — SODIUM CHLORIDE 0.9 % IV SOLN
INTRAVENOUS | Status: DC
Start: 2012-03-02 — End: 2012-03-02
  Administered 2012-03-02: 04:00:00 via INTRAVENOUS

## 2012-03-02 MED ORDER — LACTATED RINGERS IV SOLN
INTRAVENOUS | Status: DC | PRN
  Administered 2012-03-02: 10:00:00 via INTRAVENOUS

## 2012-03-02 MED ORDER — POTASSIUM CHLORIDE 10 MEQ/50ML IV SOLN
10.0000 meq | INTRAVENOUS | Status: AC
  Administered 2012-03-02 (×4): 10 meq via INTRAVENOUS
  Filled 2012-03-02: qty 200

## 2012-03-02 MED ORDER — ROCURONIUM BROMIDE 100 MG/10ML IV SOLN
INTRAVENOUS | Status: DC | PRN
  Administered 2012-03-02: 50 mg via INTRAVENOUS

## 2012-03-02 MED ORDER — SUCCINYLCHOLINE CHLORIDE 20 MG/ML IJ SOLN
INTRAMUSCULAR | Status: DC | PRN
  Administered 2012-03-02: 100 mg via INTRAVENOUS

## 2012-03-02 MED ORDER — MAGNESIUM SULFATE IN D5W 10-5 MG/ML-% IV SOLN
1.0000 g | Freq: Once | INTRAVENOUS | Status: AC
  Administered 2012-03-02: 1 g via INTRAVENOUS
  Filled 2012-03-02 (×2): qty 100

## 2012-03-02 MED ORDER — PROMETHAZINE HCL 25 MG/ML IJ SOLN: 6.2500 mg | INTRAMUSCULAR | Status: DC | PRN

## 2012-03-02 MED ORDER — PROPOFOL 10 MG/ML IV EMUL: 5.0000 ug/kg/min | INTRAVENOUS | Status: DC

## 2012-03-02 MED ORDER — MIDAZOLAM HCL 5 MG/5ML IJ SOLN
INTRAMUSCULAR | Status: DC | PRN
  Administered 2012-03-02: 4 mg via INTRAVENOUS

## 2012-03-02 MED ORDER — 0.9 % SODIUM CHLORIDE (POUR BTL) OPTIME
TOPICAL | Status: DC | PRN
  Administered 2012-03-02 (×2): 1000 mL

## 2012-03-02 MED ORDER — SODIUM CHLORIDE 0.9 % IV SOLN
50.0000 ug/h | INTRAVENOUS | Status: DC
  Administered 2012-03-02: 150 ug/h via INTRAVENOUS
  Administered 2012-03-02: 200 ug/h via INTRAVENOUS
  Filled 2012-03-02 (×3): qty 50

## 2012-03-02 MED ORDER — THIAMINE HCL 100 MG/ML IJ SOLN
INTRAVENOUS | Status: AC
  Administered 2012-03-02: 18:00:00 via INTRAVENOUS
  Filled 2012-03-02: qty 1000

## 2012-03-02 MED ORDER — PROPOFOL 10 MG/ML IV EMUL
INTRAVENOUS | Status: DC | PRN
  Administered 2012-03-02: 120 mg via INTRAVENOUS
  Administered 2012-03-02: 50 mg via INTRAVENOUS

## 2012-03-02 MED ORDER — ESMOLOL HCL 10 MG/ML IV SOLN
INTRAVENOUS | Status: DC | PRN
  Administered 2012-03-02 (×2): 10 mg via INTRAVENOUS

## 2012-03-02 MED ORDER — HYDROMORPHONE HCL PF 1 MG/ML IJ SOLN: 0.2500 mg | INTRAMUSCULAR | Status: DC | PRN

## 2012-03-02 MED ORDER — PROPOFOL 10 MG/ML IV EMUL
INTRAVENOUS | Status: DC | PRN
  Administered 2012-03-02: 50 ug/kg/min via INTRAVENOUS

## 2012-03-02 MED ORDER — SODIUM PHOSPHATE 3 MMOLE/ML IV SOLN
30.0000 mmol | Freq: Once | INTRAVENOUS | Status: DC
  Administered 2012-03-02: 30 mmol via INTRAVENOUS
  Filled 2012-03-02: qty 10

## 2012-03-02 MED ORDER — FENTANYL BOLUS VIA INFUSION
50.0000 ug | Freq: Four times a day (QID) | INTRAVENOUS | Status: DC | PRN
  Filled 2012-03-02: qty 100

## 2012-03-02 MED ORDER — PHENYLEPHRINE HCL 10 MG/ML IJ SOLN
INTRAMUSCULAR | Status: DC | PRN
  Administered 2012-03-02: 80 ug via INTRAVENOUS

## 2012-03-02 MED ORDER — SODIUM CHLORIDE 0.45 % IV SOLN
INTRAVENOUS | Status: DC
  Administered 2012-03-02: 12:00:00 via INTRAVENOUS

## 2012-03-02 MED ORDER — SODIUM CHLORIDE 0.9 % IV SOLN
2500.0000 ug | INTRAVENOUS | Status: DC | PRN
  Administered 2012-03-02: 75 ug/h via INTRAVENOUS

## 2012-03-02 SURGICAL SUPPLY — 38 items
APL SKNCLS STERI-STRIP NONHPOA (GAUZE/BANDAGES/DRESSINGS)
BENZOIN TINCTURE PRP APPL 2/3 (GAUZE/BANDAGES/DRESSINGS) IMPLANT
CANISTER SUCTION 2500CC (MISCELLANEOUS) ×3 IMPLANT
CANISTER WOUND CARE 500ML ATS (WOUND CARE) ×3 IMPLANT
CLOTH BEACON ORANGE TIMEOUT ST (SAFETY) ×3 IMPLANT
COVER SURGICAL LIGHT HANDLE (MISCELLANEOUS) ×3 IMPLANT
DRAPE LAPAROSCOPIC ABDOMINAL (DRAPES) ×3 IMPLANT
DRAPE UTILITY 15X26 W/TAPE STR (DRAPE) ×6 IMPLANT
DRSG VAC ATS LRG SENSATRAC (GAUZE/BANDAGES/DRESSINGS) ×4 IMPLANT
DRSG VERSA FOAM LRG 10X15 (GAUZE/BANDAGES/DRESSINGS) IMPLANT
ELECT REM PT RETURN 9FT ADLT (ELECTROSURGICAL) ×3
ELECTRODE REM PT RTRN 9FT ADLT (ELECTROSURGICAL) ×2 IMPLANT
GLOVE BIO SURGEON STRL SZ 6.5 (GLOVE) ×2 IMPLANT
GLOVE BIOGEL PI IND STRL 6.5 (GLOVE) ×1 IMPLANT
GLOVE BIOGEL PI IND STRL 7.0 (GLOVE) ×2 IMPLANT
GLOVE BIOGEL PI IND STRL 8 (GLOVE) ×2 IMPLANT
GLOVE BIOGEL PI INDICATOR 6.5 (GLOVE) ×1
GLOVE BIOGEL PI INDICATOR 7.0 (GLOVE) ×2
GLOVE BIOGEL PI INDICATOR 8 (GLOVE) ×1
GLOVE ECLIPSE 7.5 STRL STRAW (GLOVE) ×3 IMPLANT
GLOVE SURG SS PI 7.0 STRL IVOR (GLOVE) ×2 IMPLANT
GOWN STRL NON-REIN LRG LVL3 (GOWN DISPOSABLE) ×8 IMPLANT
KIT BASIN OR (CUSTOM PROCEDURE TRAY) ×3 IMPLANT
KIT ROOM TURNOVER OR (KITS) ×3 IMPLANT
NS IRRIG 1000ML POUR BTL (IV SOLUTION) ×3 IMPLANT
PACK GENERAL/GYN (CUSTOM PROCEDURE TRAY) ×3 IMPLANT
PAD ARMBOARD 7.5X6 YLW CONV (MISCELLANEOUS) ×6 IMPLANT
SLEEVE SURGEON STRL (DRAPES) ×4 IMPLANT
SPONGE ABDOMINAL VAC ABTHERA (MISCELLANEOUS) ×1 IMPLANT
SPONGE LAP 18X18 X RAY DECT (DISPOSABLE) ×2 IMPLANT
SUCTION POOLE TIP (SUCTIONS) ×2 IMPLANT
SUT ETHIBOND 5 LR DA (SUTURE) ×4 IMPLANT
SUT NOVA 1 T20/GS 25DT (SUTURE) ×10 IMPLANT
SUT RET BRIDGE (SUTURE) ×2 IMPLANT
SUT SILK 2 0 TIES 10X30 (SUTURE) ×2 IMPLANT
TOWEL OR 17X24 6PK STRL BLUE (TOWEL DISPOSABLE) ×3 IMPLANT
TOWEL OR 17X26 10 PK STRL BLUE (TOWEL DISPOSABLE) ×3 IMPLANT
WATER STERILE IRR 1000ML POUR (IV SOLUTION) IMPLANT

## 2012-03-02 NOTE — Anesthesia Postprocedure Evaluation (Addendum)
  Anesthesia Post-op Note  Patient: Michael Ellison  Procedure(s) Performed: Procedure(s) (LRB): DEBRIDEMENT CLOSURE/ABDOMINAL WOUND (N/A)  Patient Location:ICU 2109  Anesthesia Type: General  Level of Consciousness: sedated  Airway and Oxygen Therapy:mechanically ventilated  Post-op Pain: mild  Post-op Assessment: Post-op Vital signs reviewed, Patient's Cardiovascular Status Stable, Respiratory Function Stable, Patent Airway and No signs of Nausea or vomiting  Post-op Vital Signs: stable  Complications: No apparent anesthesia complications

## 2012-03-02 NOTE — Progress Notes (Signed)
PARENTERAL NUTRITION CONSULT NOTE - FOLLOW UP   Pharmacy Consult for TPN Indication: Prolonged ileus s/p SBR for SBO 5/10  Allergies  Allergen Reactions  . Lorazepam Other (See Comments)    Severe agitated delirium. Tolerates midazolam and other benzo's    Patient Measurements: Height: 6\' 2"  (188 cm) Weight: 177 lb 4 oz (80.4 kg) IBW/kg (Calculated) : 82.2  Usual Weight: ~82 kg  Vital Signs: Temp: 97.5 F (36.4 C) (05/16 0803) Temp src: Oral (05/16 0803) BP: 157/111 mmHg (05/16 0900) Pulse Rate: 116  (05/16 0900) Intake/Output from previous day: 05/15 0701 - 05/16 0700 In: 2233.8 [I.V.:628.1; IV Piggyback:950; TPN:655.7] Out: 3085 [Urine:3085] Intake/Output from this shift: Total I/O In: 77.5 [I.V.:27.5; TPN:50] Out: 300 [Urine:300]  Labs:  Basename 03/02/12 0500 03/01/12 0500 02/29/12 1630  WBC 20.4* 12.5* 13.4*  HGB 14.1 12.0* 12.4*  HCT 41.5 35.4* 36.0*  PLT 191 142* 122*  APTT -- -- 21*  INR -- -- 1.16     Basename 03/02/12 0500 03/01/12 1223 02/29/12 1630  NA 152* 147* 147*  K 3.4* 4.1 3.3*  CL 111 112 111  CO2 33* 26 27  GLUCOSE 112* 97 110*  BUN 20 21 28*  CREATININE 0.79 0.73 0.94  LABCREA -- -- --  CREAT24HRUR -- -- --  CALCIUM 8.3* 7.9* 7.6*  MG 1.9 2.3 --  PHOS 1.6* 2.3 --  PROT 6.1 -- 5.5*  ALBUMIN 2.1* -- 1.9*  AST 25 -- 32  ALT 24 -- 27  ALKPHOS 78 -- 45  BILITOT 0.6 -- 0.7  BILIDIR -- -- --  IBILI -- -- --  PREALBUMIN -- -- --  TRIG 144 -- --  CHOLHDL -- -- --  CHOL -- -- --   Estimated Creatinine Clearance: 106.1 ml/min (by C-G formula based on Cr of 0.79).    Basename 03/02/12 0605 03/02/12 0002 03/01/12 2006  GLUCAP 118* 117* 118*      Insulin Requirements in the past 24 hours:  None  Nutritional Goals:  2065 kCal, 105-120 grams of protein per day, 2-2.3L  Current Nutrition:  NPO  Assessment: 66 y.o. Male on TPN for prolonged ileus s/p SBR for SBO on 5/10 at [facility]. Noted PMH of alcohol abuse.  Pt NPO x 6  days and mild refeeding observed post TPN initiated 03/01/12.  GI: SBO s/p resection 5/10 at [facility], ileus on bowel rest. NPO x 6 days. NGT o/p 3250 yesterday.  No emesis/NG O/P recorded Endo: No h/o DM. CBGs WNL on SSI; plan to d/c SSI/CBG checks if CBGs remain WNL at goal TPN rate. Lytes:  Na+ trending up, hypokalemia (goal ~4 for ileus)--4 runs KCL ordered, CO2 mildly elevated, hypophosphatemia - NaPhos ordered, magnesium trending down Renal: SCr stable, good UOP Pulm: now on 6L [place] - albuterol nebs Cards:  hx HTN - BP elevated, tachycardia 110's - clonidine patch, lopressor PRN Hepatobil: LFTs WNL Neuro: acute encephalopathy (ativan allergy), on fent gtt ID: Vanc D#3 + Zosyn D#4 for PNA, presumed aspiration, afebrile, WBC trending up Best Practices:  Lovenox, PPI IV, MC  Plan:  - Continue Clinimix E 5/15 at 41ml/hr instead of advancing rate d/t mild refeeding. - Will advance to Clinimix 5/20 with goal rate of 83 ml/min and lipids M/W/F to meet 95% kcal needs and 95% protein needs when stable - Multivitamins, trace elements, and lipids on M/W/F due to national shortage. - Thiamine and folic acid daily in TPN - Mag sulfate 1gm IV x 1 - Change IVF  from NS to 1/2 NS, continue at the same rate of 20 ml/hr - F/U AM labs     Shardea Cwynar D. Laney Potash, PharmD, BCPS Pager:  [phone] 03/02/2012, 10:41 AM

## 2012-03-02 NOTE — Progress Notes (Signed)
Physical Therapy Cancellation Note: order received, pt currently in surgery for fascia closure. Will attempt next date. Thanks Delaney Meigs, PT [phone]

## 2012-03-02 NOTE — Anesthesia Preprocedure Evaluation (Signed)
Anesthesia Evaluation  Patient identified by MRN, date of birth, ID band Patient awake    Reviewed: Allergy & Precautions, H&P , NPO status , Patient's Chart, lab work & pertinent test results  Airway Mallampati: II TM Distance: >3 FB Neck ROM: Full    Dental No notable dental hx.    Pulmonary neg pulmonary ROS, pneumonia ,  breath sounds clear to auscultation  Pulmonary exam normal       Cardiovascular hypertension, +CHF negative cardio ROS  Rhythm:Regular Rate:Normal     Neuro/Psych negative neurological ROS  negative psych ROS   GI/Hepatic negative GI ROS, Neg liver ROS,   Endo/Other  negative endocrine ROS  Renal/GU negative Renal ROS  negative genitourinary   Musculoskeletal negative musculoskeletal ROS (+)   Abdominal   Peds negative pediatric ROS (+)  Hematology negative hematology ROS (+)   Anesthesia Other Findings   Reproductive/Obstetrics negative OB ROS                           Anesthesia Physical Anesthesia Plan  ASA: III and Emergent  Anesthesia Plan: General   Post-op Pain Management:    Induction: Intravenous, Rapid sequence and Cricoid pressure planned  Airway Management Planned: Oral ETT  Additional Equipment:   Intra-op Plan:   Post-operative Plan: Possible Post-op intubation/ventilation  Informed Consent: I have reviewed the patients History and Physical, chart, labs and discussed the procedure including the risks, benefits and alternatives for the proposed anesthesia with the patient or authorized representative who has indicated his/her understanding and acceptance.   Dental advisory given  Plan Discussed with: CRNA  Anesthesia Plan Comments:         Anesthesia Quick Evaluation

## 2012-03-02 NOTE — Progress Notes (Signed)
Name: Michael Ellison MRN: [id number] DOB: 1946-08-09    LOS: 7  PULMONARY / CRITICAL CARE MEDICINE  Pt Profile:   66 year old male w/ pmh of ETOH,  s/p expl lap for SBO due to adhesions on 5/10 at [facility]. Post-op course c/b delirium, persistent ileus distention and episodic desaturation due to suspected aspiration. Transferred to [facility]/PCCM service for treatment of acute respiratory failure, concern for possible post-op aspiration and delirium. Intubated shortly after arrival.   Interval hx:  Mild respiratory distress, 5L [place] this AM. Going to OR this AM secondary to facial dehiscence   Vital Signs: Temp:  [97.5 F (36.4 C)-98.8 F (37.1 C)] 97.5 F (36.4 C) (05/16 0803) Pulse Rate:  [103-134] 111  (05/16 0600) Resp:  [14-36] 28  (05/16 0600) BP: (103-177)/(66-119) 146/119 mmHg (05/16 0600) SpO2:  [88 %-100 %] 100 % (05/16 0600) FiO2 (%):  [40 %] 40 % (05/15 1600) Weight:  [80.4 kg (177 lb 4 oz)] 80.4 kg (177 lb 4 oz) (05/16 0500)  Physical Examination: General: elderly man in  Neuro:  No focal deficits, GCS 14 HEENT:  WNL Neck:  Supple, no JVD   Cardiovascular: Tachy, reg, no M/R/G Lungs:  Diffuse rhonchi Abdomen:  No BS, firm, distended,  tender diffusely, ABD in place  Musculoskeletal:  Moves all extremities, no pedal edema Skin:  No rash  Principal Problem:  *Respiratory failure following trauma and surgery Active Problems:  Aspiration pneumonia  Alcohol abuse  Ileus following gastrointestinal surgery  BP (high blood pressure)  Sinus tachycardia  Delirium, drug-induced  Agitation   ASSESSMENT AND PLAN  PULMONARY  Lab 03/01/12 0440 02/29/12 1852 02/29/12 1756 02/29/12 1720 02/29/12 0500  PHART 7.389 -- 7.343* -- 7.461*  PCO2ART 46.0* -- 42.3 -- 37.4  PO2ART 63.0* -- 87.0 -- 58.8*  HCO3 27.7* -- 22.9 -- 26.3*  O2SAT 91.0 73.2 96.0 71.9 90.3   Ventilator Settings: Vent Mode:  [-] CPAP;PSV FiO2 (%):  [40 %] 40 % Set Rate:  [20 bmp] 20 bmp PEEP:  [5 cmH20] 5  cmH20 Pressure Support:  [5 cmH20] 5 cmH20 CXR:  Bibasilar volume loss. Increase in right sided airspace disease  ETT:  5/14>>>increased infiltrate rt , et wnl, line wnl  A:  Acute respiratory failure after aspiration of bilious emesis- going to OR this AM, will likely require vent post-op P:   -Continue supplemental oxygen/Vent if needed post op -Continue ABX    CARDIOVASCULAR  Lab 03/02/12 0500 02/29/12 1630  TROPONINI -- <0.30  LATICACIDVEN -- 1.3  PROBNP 3782.0* --   ECG:   Lines:  left [place] CVL 5/14>>>  A: Septic shock/hypovolemic shock - resolved, hypertension, sinus tachycardia - reactive P:  - PRN hydralazine and metoprolol ordered -continue Catapres patch   RENAL  Lab 03/02/12 0500 03/01/12 1223 02/29/12 1630 02/29/12 0457 02/28/12 0505  NA 152* 147* 147* 143 143  K 3.4* 4.1 -- -- --  CL 111 112 111 105 108  CO2 33* 26 27 28 23   BUN 20 21 28* 30* 46*  CREATININE 0.79 0.73 0.94 1.01 1.24  CALCIUM 8.3* 7.9* 7.6* 8.6 7.8*  MG 1.9 2.3 -- -- --  PHOS 1.6* 2.3 -- -- --   Intake/Output      05/15 0701 - 05/16 0700 05/16 0701 - 05/17 0700   P.O.     I.V. (mL/kg) 628.1 (7.8)    NG/GT     IV Piggyback 950    TPN 655.7    Total Intake(mL/kg)  2233.8 (27.8)    Urine (mL/kg/hr) 3085 (1.6)    Emesis/NG output     Total Output 3085    Net -851.2          Foley:  5/13  A:  Hypokalmemia  P:   -replaced -monitor electrolytes   GASTROINTESTINAL  Lab 03/02/12 0500 02/29/12 1630 02/24/12 0858  AST 25 32 24  ALT 24 27 22   ALKPHOS 78 45 64  BILITOT 0.6 0.7 0.9  PROT 6.1 5.5* 8.7*  ALBUMIN 2.1* 1.9* 4.3   CTAP 5/14: Residual dilatation of stomach and proximal small bowel with decompressed distal small bowel and colon. Changes could represent ileus or residual obstruction. Dilatation extends distal to the area of the anastomoses. Nonspecific wall thickening of jejunal loops may represent edema. No free air or free fluid to suggest perforation.   A:  Post op  ileus s/p exploratory lap on 5/10 P:   - Cont gastric decompression with NGT - Continue TPN, keep NPO  A: Fascial dehiscence - closure of fascia, wound vac 5/16 P: -Per CCS -OR today   HEMATOLOGIC  Lab 03/02/12 0500 03/01/12 0500 02/29/12 1630 02/29/12 0457 02/28/12 0505  HGB 14.1 12.0* 12.4* 13.9 12.8*  HCT 41.5 35.4* 36.0* 41.1 38.3*  PLT 191 142* 122* 162 149*  INR -- -- 1.16 -- --  APTT -- -- 21* -- --   A:  Mild Anemia - no evidence of acute blood loss. No indication for PRBCs P:  -Monitor CBC intermittently   INFECTIOUS  Lab 03/02/12 0500 03/01/12 0500 02/29/12 1630 02/29/12 0457 02/28/12 0505  WBC 20.4* 12.5* 13.4* 14.0* 9.4  PROCALCITON 18.16 -- 52.60 -- --   Cultures: BCX2 5/14 >> UC 5/14 >> Sputum 5/14 >> GNRs >>  Wound 5/14 >> GNRs >>   Antibiotics: nosocomial asp vanc 5/14 >>  Zosyn 5/13 >>   A:  Aspiration PNA  P:   - cultures sent, will follow - Cont empiric HCAP coverage.   A: SIRS P: -continue ABX,  -f/u cultures -check lactic acid/PCT?  -monitor CVP    ENDOCRINE  Lab 03/02/12 0605 03/02/12 0002 03/01/12 2006 03/01/12 1657 03/01/12 1142  GLUCAP 118* 117* 118* 106* 99   A:  No h/o DM P:   -check CBG q 4- well controled   NEUROLOGIC  A:  Acute encephalopathy - markedly improved. Appears to have had adverse reaction to lorazepam but has tolerated midazolam.  Post-operative pain P:   - PRN Versed, Haldol - Cont thiamine and folate.  - Cont fentanyl gtt  BEST PRACTICE / DISPOSITION - Level of Care:  icu - Primary Service:  pccm - Consultants:  CCS - Code Status:  full - Diet:  TPN - DVT Px:  lmwh - GI Px:  ppi - Social / Family: no family available     BABCOCK,PETE,   03/02/2012, 8:44 AM   Seen on CCM rounds this morning with resident MD or ACNP above.  Pt examined and database reviewed. I agree with above findings, assessment and plan as reflected in the note above. 40 mins CCM time  Billy Fischer, MD;  PCCM  service; Mobile [phone]

## 2012-03-02 NOTE — Progress Notes (Signed)
Changes made by Dr. Bard Herbert.

## 2012-03-02 NOTE — Progress Notes (Signed)
eLink Physician-Brief Progress Note Patient Name: CEFERINO LANG DOB: [birth year] MRN: [id number]  Date of Service  03/02/2012   HPI/Events of Note  HYpophosphatemia and hypokalemia   eICU Interventions  Potassium and phos replaced   Intervention Category Intermediate Interventions: Electrolyte abnormality - evaluation and management  Tuwanda Vokes 03/02/2012, 6:49 AM

## 2012-03-02 NOTE — Op Note (Signed)
OPERATIVE REPORT  DATE OF OPERATION: 02/24/2012 - 03/02/2012  PATIENT:  Michael Ellison  66 y.Ellison. male  PRE-OPERATIVE DIAGNOSIS:  Fascial dehiscence  POST-OPERATIVE DIAGNOSIS:  Fascial dehiscence  PROCEDURE:  Procedure(s): DEBRIDEMENT CLOSURE/ABDOMINAL WOUND  SURGEON:  Surgeon(s): Cherylynn Ridges, MD  ASSISTANT: None  ANESTHESIA:   general  EBL: <100 ml  BLOOD ADMINISTERED: none  DRAINS: Nasogastric Tube and Urinary Catheter (Foley)   SPECIMEN:  No Specimen  COUNTS CORRECT:  YES  PROCEDURE DETAILS: The patient was taken to the operating room and placed on the table in supine position. After an adequate endotracheal anesthetic was administered he was prepped and draped in usual sterile manner exposing his midline and his abdomen.  The patient had a fascial dehiscence. After proper time out was performed identifying the patient and the procedure to be performed we cut the patient's previous fascial sutures out of place. Most of them the pull-through the fascia anyway. Once all the previous fascial sutures were removed with the patient adequately relax we explored the abdomen we found no evidence of enteric contents and no anastomotic leak. Also moderate amount of ascites which was aspirated to a tube was not a proper position and therefore we repositioned a new NG tube in the gastric area and decompression. Once this was done we we ran the bowel mostly involving her to be no evidence of anastomotic leakage. We debrided the edges of the fascia and also perform flap circumferentially so that we to reapproximate the fascia. Once we are done so we irrigated again and we reapproximated the fascia using interrupted simple #1 Novafil sutures. 3 retention sutures with #5 Ethibond were placed also along with plastic bridges. Once this was done black foam negative pressure dressing was placed in the midline wound and placed down prior to closing the retention sutures. Once this was done with adequate  suction retention sutures were tied down. A dressing was applied all counts were correct including needles sponges and instruments.  PATIENT DISPOSITION:  ICU - intubated and hemodynamically stable.   Michael Ellison,Michael Ellison 5/16/201311:31 AM

## 2012-03-02 NOTE — Preoperative (Signed)
Beta Blockers   Reason not to administer Beta Blockers:Not Applicable 

## 2012-03-02 NOTE — Progress Notes (Signed)
[facility]/Daveena Elmore Progress Note 6 Days Post-Op  Subjective: Patient still struggling.  Has almost complete fascial dehiscence today.  Objective: Vital signs in last 24 hours: Temp:  [97.5 F (36.4 C)-98.8 F (37.1 C)] 97.5 F (36.4 C) (05/16 0803) Pulse Rate:  [103-134] 111  (05/16 0600) Resp:  [14-36] 28  (05/16 0600) BP: (103-177)/(66-119) 146/119 mmHg (05/16 0600) SpO2:  [88 %-100 %] 100 % (05/16 0600) FiO2 (%):  [40 %] 40 % (05/15 1600) Weight:  [80.4 kg (177 lb 4 oz)] 80.4 kg (177 lb 4 oz) (05/16 0500) Last BM Date: 02/28/12  Intake/Output from previous day: 05/15 0701 - 05/16 0700 In: 2233.8 [I.V.:628.1; IV Piggyback:950; TPN:655.7] Out: 3085 [Urine:3085] Intake/Output this shift:    General: Struggling to breat a bit.  Alert, understans that he will need surgery.  Lungs: Course breath sounds bilaterally  Abd: Distended, bowel exposed through wound  Extremities: No changes  Neuro: Intact  Lab Results:  @LABLAST2 (wbc:2,hgb:2,hct:2,plt:2) BMET  Basename 03/02/12 0500 03/01/12 1223  NA 152* 147*  K 3.4* 4.1  CL 111 112  CO2 33* 26  GLUCOSE 112* 97  BUN 20 21  CREATININE 0.79 0.73  CALCIUM 8.3* 7.9*   PT/INR  Basename 02/29/12 1630  LABPROT 15.0  INR 1.16   ABG  Basename 03/01/12 0440 02/29/12 1756  PHART 7.389 7.343*  HCO3 27.7* 22.9    Studies/Results: Ct Abdomen Pelvis Wo Contrast  03/01/2012  *RADIOLOGY REPORT*  Clinical Data:  Post bowel resection for bowel obstruction. Aspiration.  Evaluate for leak.  Evaluate infiltrates.  The  CT CHEST, ABDOMEN AND PELVIS WITHOUT CONTRAST  Technique:  Multidetector CT imaging of the chest, abdomen and pelvis was performed following the standard protocol without IV contrast.  Comparison:  CT abdomen and pelvis 02/24/2012.  CT CHEST  Findings:  Endotracheal and enteric tubes are in place.  Left central venous catheter with tip in the SVC.  Normal caliber thoracic aorta.  Prominent central pulmonary vessels suggesting  congestion arterial hypertension. Small bilateral pleural effusions with basilar atelectasis or consolidation bilaterally.  Extensive emphysematous changes in the lungs with bullous changes in both apices.  Scarring in the apices bilaterally.  Small ground-glass nodule in the right middle lung measuring about 4 mm diameter may represent infiltrative process.  No pneumothorax.  Cardiac enlargement.  No significant lymphadenopathy in the chest. Degenerative changes in the thoracic spine.  Schmorl's node at T12.  IMPRESSION: Cardiac enlargement with pulmonary vascular congestion.  Bilateral pleural effusions with basilar atelectasis or consolidation.  Small ground-glass nodule in the right mid lung.  CT ABDOMEN AND PELVIS  Findings:  Mild persistent distension of the stomach, duodenum, and proximal jejunum.  Distal bowel are decompressed.  Residual obstruction is not excluded although changes could represent ileus. Transition zone appears to be distal to the region of the anastomoses. Nonspecific wall thickening and seven mid abdominal jejunal loops.  Contrast material extends to the region of the proximal jejunum.  No distal contrast material.  No evidence of contrast extravasation.  No significant free fluid.  No free intra- abdominal air.  Diffusely increased density throughout the subcutaneous and intra-abdominal fat consistent with edema.  The unenhanced appearance of the liver, spleen, gallbladder, pancreas, and adrenal glands is unremarkable.  Calcification of abdominal aorta without aneurysm.  Low attenuation lesions in the kidneys consistent with cysts as demonstrated on the previous study.  No retroperitoneal lymphadenopathy.  Pelvis:  Central venous catheter in the right femoral vein.  Small amount of free fluid in  the pelvis.  No free air.  No loculated fluid.  The colon is decompressed.  Diverticula are present in the sigmoid region without inflammatory change.  Appendix is not well visualized.  The  degenerative changes in the spine.  IMPRESSION: Residual dilatation of stomach and proximal small bowel with decompressed distal small bowel and colon.  Changes could represent ileus or residual obstruction.  Dilatation extends distal to the area of the anastomoses.  Nonspecific wall thickening of jejunal loops may represent edema.  No free air or free fluid to suggest perforation.  Original Report Authenticated By: Marlon Pel, M.D.   Ct Chest Wo Contrast  03/01/2012  *RADIOLOGY REPORT*  Clinical Data:  Post bowel resection for bowel obstruction. Aspiration.  Evaluate for leak.  Evaluate infiltrates.  The  CT CHEST, ABDOMEN AND PELVIS WITHOUT CONTRAST  Technique:  Multidetector CT imaging of the chest, abdomen and pelvis was performed following the standard protocol without IV contrast.  Comparison:  CT abdomen and pelvis 02/24/2012.  CT CHEST  Findings:  Endotracheal and enteric tubes are in place.  Left central venous catheter with tip in the SVC.  Normal caliber thoracic aorta.  Prominent central pulmonary vessels suggesting congestion arterial hypertension. Small bilateral pleural effusions with basilar atelectasis or consolidation bilaterally.  Extensive emphysematous changes in the lungs with bullous changes in both apices.  Scarring in the apices bilaterally.  Small ground-glass nodule in the right middle lung measuring about 4 mm diameter may represent infiltrative process.  No pneumothorax.  Cardiac enlargement.  No significant lymphadenopathy in the chest. Degenerative changes in the thoracic spine.  Schmorl's node at T12.  IMPRESSION: Cardiac enlargement with pulmonary vascular congestion.  Bilateral pleural effusions with basilar atelectasis or consolidation.  Small ground-glass nodule in the right mid lung.  CT ABDOMEN AND PELVIS  Findings:  Mild persistent distension of the stomach, duodenum, and proximal jejunum.  Distal bowel are decompressed.  Residual obstruction is not excluded although  changes could represent ileus. Transition zone appears to be distal to the region of the anastomoses. Nonspecific wall thickening and seven mid abdominal jejunal loops.  Contrast material extends to the region of the proximal jejunum.  No distal contrast material.  No evidence of contrast extravasation.  No significant free fluid.  No free intra- abdominal air.  Diffusely increased density throughout the subcutaneous and intra-abdominal fat consistent with edema.  The unenhanced appearance of the liver, spleen, gallbladder, pancreas, and adrenal glands is unremarkable.  Calcification of abdominal aorta without aneurysm.  Low attenuation lesions in the kidneys consistent with cysts as demonstrated on the previous study.  No retroperitoneal lymphadenopathy.  Pelvis:  Central venous catheter in the right femoral vein.  Small amount of free fluid in the pelvis.  No free air.  No loculated fluid.  The colon is decompressed.  Diverticula are present in the sigmoid region without inflammatory change.  Appendix is not well visualized.  The degenerative changes in the spine.  IMPRESSION: Residual dilatation of stomach and proximal small bowel with decompressed distal small bowel and colon.  Changes could represent ileus or residual obstruction.  Dilatation extends distal to the area of the anastomoses.  Nonspecific wall thickening of jejunal loops may represent edema.  No free air or free fluid to suggest perforation.  Original Report Authenticated By: Marlon Pel, M.D.   Dg Chest Port 1 View  03/02/2012  *RADIOLOGY REPORT*  Clinical Data: Shortness of breath, respiratory failure  PORTABLE CHEST - 1 VIEW  Comparison: Portable  chest x-ray of 03/01/2012  Findings: The lungs are not as well aerated.  There are bibasilar opacities right greater than left consistent with atelectasis, possible right effusion, and pneumonia cannot be excluded.  Mild cardiomegaly is stable.  An NG tube is present.  There appear to be  bullous changes in the left lung apex.  A left central venous line is noted with the tip in the mid SVC.  IMPRESSION:  1.  Perhaps slight worsening of bibasilar opacities right greater than left. 2.  Tip of left central venous line in mid SVC.  Diminished aeration.  Original Report Authenticated By: Juline Patch, M.D.   Portable Chest Xray In Am  03/01/2012  *RADIOLOGY REPORT*  Clinical Data: Intubated patient.  PORTABLE CHEST - 1 VIEW  Comparison: Plain film chest CT chest 02/29/2012.  Findings: Support tubes and lines are unchanged.  Right worse than left basilar airspace disease persists, also without change.  Lungs are emphysematous.  Heart size upper normal.  IMPRESSION: No interval change.  Original Report Authenticated By: Bernadene Bell. Maricela Curet, M.D.   Portable Chest Xray  02/29/2012  *RADIOLOGY REPORT*  Clinical Data: Endotracheal tube placement.  Left subclavian line placement.  PORTABLE CHEST - 1 VIEW  Comparison: Portable chest 02/29/2012.  Findings: The the patient is now intubated.  Endotracheal tube terminates 4 cm above the carina, in satisfactory position.  A left subclavian line is in place.  The tip is at the cavoatrial junction.  Biapical pleural blebs are evident without evidence for pneumothorax.  Right lower lobe airspace disease is increasing. Left basilar atelectasis is stable.  Moderate pulmonary vascular congestion is slightly worse than on the prior exam.  IMPRESSION:  1.  Satisfactory positioning of the endotracheal tube and left subclavian line. 2.  Increasing right basilar airspace disease is worrisome for infection. 3.  Increasing pulmonary vascular congestion.  Original Report Authenticated By: Jamesetta Orleans. MATTERN, M.D.   Dg Abd Portable 1v  02/29/2012  *RADIOLOGY REPORT*  Clinical Data: Abdominal distention, possible ileus  PORTABLE ABDOMEN - 1 VIEW  Comparison: CT abdomen pelvis of 02/24/2012  Findings: Both large and small bowel gas is present most consistent with  postoperative ileus.  Surgical staples are noted along the midline.  No bony abnormality is seen.  IMPRESSION: Probable postoperative ileus.  Original Report Authenticated By: Juline Patch, M.D.    Anti-infectives: Anti-infectives     Start     Dose/Rate Route Frequency Ordered Stop   02/29/12 1900   micafungin (MYCAMINE) 100 mg in sodium chloride 0.9 % 100 mL IVPB  Status:  Discontinued        100 mg 100 mL/hr over 1 Hours Intravenous Every 24 hours 02/29/12 1754 03/01/12 0933   02/29/12 1730   vancomycin (VANCOCIN) 1,250 mg in sodium chloride 0.9 % 250 mL IVPB        1,250 mg 166.7 mL/hr over 90 Minutes Intravenous Every 12 hours 02/29/12 1644     02/29/12 1715   vancomycin (VANCOCIN) IVPB 1000 mg/200 mL premix  Status:  Discontinued        1,000 mg 200 mL/hr over 60 Minutes Intravenous  Once 02/29/12 1637 02/29/12 1647   02/29/12 1645   piperacillin-tazobactam (ZOSYN) IVPB 3.375 g  Status:  Discontinued        3.375 g 100 mL/hr over 30 Minutes Intravenous  Once 02/29/12 1637 02/29/12 1641   02/28/12 1800   piperacillin-tazobactam (ZOSYN) IVPB 3.375 g  Status:  Discontinued  3.375 g 12.5 mL/hr over 240 Minutes Intravenous 4 times per day 02/28/12 1454 02/28/12 1510   02/28/12 1800  piperacillin-tazobactam (ZOSYN) IVPB 3.375 g       3.375 g 12.5 mL/hr over 240 Minutes Intravenous Every 8 hours 02/28/12 1510     02/25/12 1109   dextrose 5 % with cefOXitin (MEFOXIN) ADS Med     Comments: HOLLIE, CHRISTINA: cabinet override         02/25/12 1109 02/25/12 2314   02/25/12 1100   cefOXitin (MEFOXIN) 1 g in dextrose 5 % 50 mL IVPB  Status:  Discontinued        1 g 100 mL/hr over 30 Minutes Intravenous 60 min pre-op 02/25/12 1100 02/25/12 1542          Assessment/Plan: s/p Procedure(s): EXPLORATORY LAPAROTOMY SMALL BOWEL RESECTION Continue ABX therapy due to Post-op infection Complete fascial dehiscence. Back to surgery ASAP for fascial closure.  LOS: 7 days    Marta Lamas. [name], MD, FACS [phone] [facility] 03/02/2012

## 2012-03-02 NOTE — Progress Notes (Signed)
Nutrition Follow-up  Patient was re-intubated this morning.  S/P surgery this morning for fascial closure of complete fascial dehiscence.  Diet Order:  NPO  Patient is receiving TPN with Clinimix E 5/15 @ 40 ml/hr.  Lipids (20% IVFE @ 10 ml/hr), multivitamins, and trace elements are provided 3 times weekly (MWF) due to national backorder.  Provides 887 kcal and 48 grams protein daily (based on weekly average).  Meets 45% minimum estimated kcal and 46% minimum estimated protein needs.   Meds: Scheduled Meds:   . albuterol  2.5 mg Nebulization Q6H  . antiseptic oral rinse  15 mL Mouth Rinse QID  . chlorhexidine  15 mL Mouth Rinse BID  . cloNIDine  0.3 mg Transdermal Weekly  . enoxaparin  40 mg Subcutaneous Q24H  . insulin aspart  0-9 Units Subcutaneous Q6H  . magnesium sulfate 1 - 4 g bolus IVPB  1 g Intravenous Once  . pantoprazole (PROTONIX) IV  40 mg Intravenous QHS  . piperacillin-tazobactam (ZOSYN)  IV  3.375 g Intravenous Q8H  . potassium chloride  10 mEq Intravenous Q1 Hr x 2  . potassium chloride  10 mEq Intravenous Q1 Hr x 4  . sodium chloride  10-40 mL Intracatheter Q12H  . vancomycin  1,250 mg Intravenous Q12H  . DISCONTD: sodium phosphate  Dextrose 5% IVPB  30 mmol Intravenous Once   Continuous Infusions:   . sodium chloride 20 mL/hr at 03/02/12 1213  . TPN [facility]) +/- additives Stopped (03/02/12 1008)   And  . fat emulsion Stopped (03/02/12 1008)  . fentaNYL infusion INTRAVENOUS 150 mcg/hr (03/02/12 1355)  . propofol Stopped (03/02/12 1345)  . TPN (CLINIMIX) +/- additives    . DISCONTD: sodium chloride 20 mL/hr at 03/02/12 0400  . DISCONTD: fentaNYL infusion INTRAVENOUS Stopped (03/02/12 1008)   PRN Meds:.acetaminophen, albuterol, fentaNYL, hydrALAZINE, metoprolol, midazolam, sodium chloride, DISCONTD: 0.9 % irrigation (POUR BTL), DISCONTD:  HYDROmorphone (DILAUDID) injection, DISCONTD: promethazine  Labs:  CMP     Component Value Date/Time   NA 152* 03/02/2012  0500   K 3.4* 03/02/2012 0500   CL 111 03/02/2012 0500   CO2 33* 03/02/2012 0500   GLUCOSE 112* 03/02/2012 0500   BUN 20 03/02/2012 0500   CREATININE 0.79 03/02/2012 0500   CALCIUM 8.3* 03/02/2012 0500   PROT 6.1 03/02/2012 0500   ALBUMIN 2.1* 03/02/2012 0500   AST 25 03/02/2012 0500   ALT 24 03/02/2012 0500   ALKPHOS 78 03/02/2012 0500   BILITOT 0.6 03/02/2012 0500   GFRNONAA >90 03/02/2012 0500   GFRAA >90 03/02/2012 0500  Phosphorus 1.6 (low) this morning, up to 2.3 at noon today Magnesium 1.9 this morning, 2.3 at noon today   CBG (last 3)   Basename 03/02/12 1149 03/02/12 0605 03/02/12 0002  GLUCAP 154* 118* 117*     Intake/Output Summary (Last 24 hours) at 03/02/12 1514 Last data filed at 03/02/12 1400  Gross per 24 hour  Intake 2405.67 ml  Output   3025 ml  Net -619.33 ml    Weight Status:  80.4 kg (down from 85.9 kg 5/15 with negative fluid balance); BMI=22.7  Re-estimated needs:  1985 kcals, 105-120 grams protein 2-2.2 liters fluid daily  Nutrition Dx:  Inadequate oral intake, ongoing.  Goal:  Intake to meet 90-100% of estimated nutrition needs, unmet.  TPN is being advanced slowly due to signs of refeeding syndrome this morning (low potassium, low phosphorus).  TPN to remain at 40 ml/h with next bag tonight.  Intervention:  Continue  slow TPN advancement to prevent further signs/symptoms of refeeding syndrome.  Monitor:  TPN tolerance, labs, weight trend.   Hettie Holstein Pager #:  [phone]

## 2012-03-02 NOTE — Transfer of Care (Signed)
Immediate Anesthesia Transfer of Care Note  Patient: Michael Ellison  Procedure(s) Performed: Procedure(s) (LRB): DEBRIDEMENT CLOSURE/ABDOMINAL WOUND (N/A)  Patient Location: ICU 2109  Anesthesia Type: General  Level of Consciousness: sedated  Airway & Oxygen Therapy: Patient remains intubated per anesthesia plan and Patient placed on Ventilator (see vital sign flow sheet for setting)  Post-op Assessment: Post -op Vital signs reviewed and stable report given to Greig Castilla, RN 2109  Post vital signs: Reviewed and stable  Complications: No apparent anesthesia complications

## 2012-03-03 ENCOUNTER — Inpatient Hospital Stay (HOSPITAL_COMMUNITY): Payer: Medicare Other

## 2012-03-03 ENCOUNTER — Encounter (HOSPITAL_COMMUNITY): Payer: Self-pay | Admitting: General Surgery

## 2012-03-03 LAB — BASIC METABOLIC PANEL
Calcium: 8.1 mg/dL — ABNORMAL LOW (ref 8.4–10.5)
Creatinine, Ser: 1.02 mg/dL (ref 0.50–1.35)
GFR calc non Af Amer: 75 mL/min — ABNORMAL LOW (ref >90)
Glucose, Bld: 128 mg/dL — ABNORMAL HIGH (ref 70–99)
Sodium: 150 mEq/L — ABNORMAL HIGH (ref 135–145)

## 2012-03-03 LAB — GLUCOSE, CAPILLARY
Glucose-Capillary: 119 mg/dL — ABNORMAL HIGH (ref 70–99)
Glucose-Capillary: 134 mg/dL — ABNORMAL HIGH (ref 70–99)

## 2012-03-03 LAB — PHOSPHORUS: Phosphorus: 2.8 mg/dL (ref 2.3–4.6)

## 2012-03-03 LAB — CBC
MCH: 30.5 pg (ref 26.0–34.0)
MCHC: 34.5 g/dL (ref 30.0–36.0)
MCV: 88.4 fL (ref 78.0–100.0)
Platelets: 197 10*3/uL (ref 150–400)

## 2012-03-03 LAB — WOUND CULTURE

## 2012-03-03 LAB — CULTURE, RESPIRATORY W GRAM STAIN

## 2012-03-03 LAB — MAGNESIUM: Magnesium: 2.2 mg/dL (ref 1.5–2.5)

## 2012-03-03 MED ORDER — POTASSIUM CHLORIDE 10 MEQ/50ML IV SOLN
10.0000 meq | INTRAVENOUS | Status: AC
  Administered 2012-03-03 (×4): 10 meq via INTRAVENOUS
  Filled 2012-03-03 (×4): qty 50

## 2012-03-03 MED ORDER — SODIUM CHLORIDE 0.9 % IV SOLN
1750.0000 mg | Freq: Two times a day (BID) | INTRAVENOUS | Status: DC
  Filled 2012-03-03: qty 1750

## 2012-03-03 MED ORDER — SODIUM CHLORIDE 0.9 % IV SOLN
25.0000 ug/h | INTRAVENOUS | Status: DC
  Administered 2012-03-03: 100 ug/h via INTRAVENOUS
  Administered 2012-03-04 – 2012-03-05 (×2): 200 ug/h via INTRAVENOUS
  Filled 2012-03-03 (×4): qty 50

## 2012-03-03 MED ORDER — MIDAZOLAM HCL 2 MG/2ML IJ SOLN
0.5000 mg | INTRAMUSCULAR | Status: DC | PRN
  Administered 2012-03-05 (×2): 1 mg via INTRAVENOUS
  Filled 2012-03-03 (×2): qty 2

## 2012-03-03 MED ORDER — TRACE MINERALS CR-CU-MN-SE-ZN 10-1000-500-60 MCG/ML IV SOLN
INTRAVENOUS | Status: AC
  Administered 2012-03-03: 17:00:00 via INTRAVENOUS
  Filled 2012-03-03: qty 2000

## 2012-03-03 MED ORDER — FAT EMULSION 20 % IV EMUL
250.0000 mL | INTRAVENOUS | Status: AC
  Administered 2012-03-03: 250 mL via INTRAVENOUS
  Filled 2012-03-03: qty 250

## 2012-03-03 NOTE — Progress Notes (Signed)
PARENTERAL NUTRITION CONSULT NOTE - FOLLOW UP   Pharmacy Consult for TPN Indication: Prolonged ileus s/p SBR for SBO 5/10  Allergies  Allergen Reactions  . Lorazepam Other (See Comments)    Severe agitated delirium. Tolerates midazolam and other benzo's    Patient Measurements: Height: 6\' 2"  (188 cm) Weight: 177 lb 4 oz (80.4 kg) IBW/kg (Calculated) : 82.2  Usual Weight: ~82 kg  Vital Signs: Temp: 97.5 F (36.4 C) (05/17 0816) Temp src: Oral (05/17 0816) BP: 147/90 mmHg (05/17 0600) Pulse Rate: 116  (05/17 0600) Intake/Output from previous day: 05/16 0701 - 05/17 0700 In: 2403.5 [I.V.:1252.5; IV Piggyback:191; TPN:960] Out: 2210 [Urine:1460; Emesis/NG output:500; Blood:250] Intake/Output from this shift:    Labs:  Basename 03/03/12 0510 03/02/12 0500 03/01/12 0500 02/29/12 1630  WBC 23.8* 20.4* 12.5* --  HGB 13.4 14.1 12.0* --  HCT 38.8* 41.5 35.4* --  PLT 197 191 142* --  APTT -- -- -- 21*  INR -- -- -- 1.16     Basename 03/03/12 0510 03/02/12 0500 03/01/12 1223 02/29/12 1630  NA 150* 152* 147* --  K 3.4* 3.4* 4.1 --  CL 110 111 112 --  CO2 32 33* 26 --  GLUCOSE 128* 112* 97 --  BUN 26* 20 21 --  CREATININE 1.02 0.79 0.73 --  LABCREA -- -- -- --  CREAT24HRUR -- -- -- --  CALCIUM 8.1* 8.3* 7.9* --  MG 2.2 1.9 2.3 --  PHOS 2.8 1.6* 2.3 --  PROT -- 6.1 -- 5.5*  ALBUMIN -- 2.1* -- 1.9*  AST -- 25 -- 32  ALT -- 24 -- 27  ALKPHOS -- 78 -- 45  BILITOT -- 0.6 -- 0.7  BILIDIR -- -- -- --  IBILI -- -- -- --  PREALBUMIN -- <3.0* -- --  TRIG -- 144 -- --  CHOLHDL -- -- -- --  CHOL -- -- -- --   Estimated Creatinine Clearance: 82.1 ml/min (by C-G formula based on Cr of 1.02).    Basename 03/03/12 0626 03/02/12 2359 03/02/12 1751  GLUCAP 119* 134* 135*      Insulin Requirements in the past 24 hours:  4 units  Nutritional Goals:  1985 kCal, 105-120 grams of protein per day, 2-2.3L  Current Nutrition:  Clinimix E 5/15 at 76ml/hr + lipids 25ml/hr  M/W/F Goal: Clinimix E 5/20 at 83 ml/hr + lipids 52ml/hr M/W/F providing 1960 kcal; 100 gm protein  Assessment: 66 y.o. Male on TPN for prolonged ileus s/p SBR for SBO on 5/10 at [facility]. Noted PMH of alcohol abuse.  Pt NPO x 6 days and mild refeeding observed post TPN initiated 03/01/12. Refeeding better past replacement of lytes. Baseline prealbumin <3.  GI: SBO s/p resection 5/10 at [facility], ileus on bowel rest. NPO x 6 days. NGT o/p decreased to 500. S/p OR 5/16 for debridement/abd closure. Endo: No h/o DM. CBGs WNL on SSI; plan to d/c SSI/CBG checks if CBGs remain WNL at goal TPN rate. Lytes:  Na+ remains up -  Probably due to fluid status. hypokalemia (goal ~4 for ileus)--4 runs KCL ordered. Others wnl. Renal: SCr stable, UOP decreasing - will watch Pulm: reintubated post OR 5/16 - albuterol nebs Cards:  hx HTN - BP variable, tachycardic this a.m. 116 - clonidine patch, lopressor and hydralazine PRN Hepatobil: LFTs WNL Neuro: acute encephalopathy (ativan allergy) ID: Vanc D#4 + Zosyn D#5 for Kleb PNA, presumed aspiration/Kleb and Enterobacter in wound/20 kcol enterococcus in urine. Tm 100.9, WBC trending up  Best Practices:  Lovenox, PPI IV, MC  Plan:  - Change Clinimix to E 5/20 at 56ml/hr - will advance to goal slowly due to mild refeeding initially. - Multivitamins, trace elements, and lipids on M/W/F due to national shortage. - Thiamine and folic acid daily in TPN - KCl IV x 4 runs - F/U AM labs     Thuy D. Laney Potash, PharmD, BCPS Pager:  [phone] 03/03/2012, 8:49 AM

## 2012-03-03 NOTE — Progress Notes (Signed)
Physical Therapy Cancellation Note: pt remains intubated post op. Will hold eval pt not medically ready.  Thanks Delaney Meigs, PT [phone]

## 2012-03-03 NOTE — Progress Notes (Signed)
CCS/Fayola Meckes Progress Note 1 Day Post-Op  Subjective: Patient is alert on the ventilator.  Still has coarse breath sounds.  Not weaning yet.  Objective: Vital signs in last 24 hours: Temp:  [99.2 F (37.3 C)-100.9 F (38.3 C)] 99.2 F (37.3 C) (05/17 0400) Pulse Rate:  [40-124] 116  (05/17 0600) Resp:  [23-39] 28  (05/17 0600) BP: (91-157)/(71-111) 147/90 mmHg (05/17 0600) SpO2:  [79 %-100 %] 93 % (05/17 0600) FiO2 (%):  [30 %-50 %] 30 % (05/17 0600) Last BM Date: 02/28/12  Intake/Output from previous day: 05/16 0701 - 05/17 0700 In: 2403.5 [I.V.:1252.5; IV Piggyback:191; TPN:960] Out: 2210 [Urine:1460; Emesis/NG output:500; Blood:250] Intake/Output this shift:    General: No acute distress, but seems uncomfortable when abdomen is pressed.  Lungs: Coarse breath sounds bilaterally with R>L.  Oxygen saturations good on FIO2 30%.  Abd: Firm but not tense, good bowel sounds.  No wheezes  Extremities: No DVT signs  Neuro: Seems to be intact  Lab Results:  @LABLAST2 (wbc:2,hgb:2,hct:2,plt:2) BMET  Basename 03/03/12 0510 03/02/12 0500  NA 150* 152*  K 3.4* 3.4*  CL 110 111  CO2 32 33*  GLUCOSE 128* 112*  BUN 26* 20  CREATININE 1.02 0.79  CALCIUM 8.1* 8.3*   PT/INR  Basename 02/29/12 1630  LABPROT 15.0  INR 1.16   ABG  Basename 03/01/12 0440 02/29/12 1756  PHART 7.389 7.343*  HCO3 27.7* 22.9    Studies/Results: Dg Chest Port 1 View  03/02/2012  *RADIOLOGY REPORT*  Clinical Data: Shortness of breath, respiratory failure  PORTABLE CHEST - 1 VIEW  Comparison: Portable chest x-ray of 03/01/2012  Findings: The lungs are not as well aerated.  There are bibasilar opacities right greater than left consistent with atelectasis, possible right effusion, and pneumonia cannot be excluded.  Mild cardiomegaly is stable.  An NG tube is present.  There appear to be bullous changes in the left lung apex.  A left central venous line is noted with the tip in the mid SVC.  IMPRESSION:   1.  Perhaps slight worsening of bibasilar opacities right greater than left. 2.  Tip of left central venous line in mid SVC.  Diminished aeration.  Original Report Authenticated By: Juline Patch, M.D.    Anti-infectives: Anti-infectives     Start     Dose/Rate Route Frequency Ordered Stop   03/03/12 1800   vancomycin (VANCOCIN) 1,750 mg in sodium chloride 0.9 % 500 mL IVPB        1,750 mg 250 mL/hr over 120 Minutes Intravenous Every 12 hours 03/03/12 0628     02/29/12 1900   micafungin (MYCAMINE) 100 mg in sodium chloride 0.9 % 100 mL IVPB  Status:  Discontinued        100 mg 100 mL/hr over 1 Hours Intravenous Every 24 hours 02/29/12 1754 03/01/12 0933   02/29/12 1730   vancomycin (VANCOCIN) 1,250 mg in sodium chloride 0.9 % 250 mL IVPB  Status:  Discontinued        1,250 mg 166.7 mL/hr over 90 Minutes Intravenous Every 12 hours 02/29/12 1644 03/03/12 0628   02/29/12 1715   vancomycin (VANCOCIN) IVPB 1000 mg/200 mL premix  Status:  Discontinued        1,000 mg 200 mL/hr over 60 Minutes Intravenous  Once 02/29/12 1637 02/29/12 1647   02/29/12 1645   piperacillin-tazobactam (ZOSYN) IVPB 3.375 g  Status:  Discontinued        3.375 g 100 mL/hr over 30 Minutes Intravenous  Once  02/29/12 1637 02/29/12 1641   02/28/12 1800   piperacillin-tazobactam (ZOSYN) IVPB 3.375 g  Status:  Discontinued        3.375 g 12.5 mL/hr over 240 Minutes Intravenous 4 times per day 02/28/12 1454 02/28/12 1510   02/28/12 1800  piperacillin-tazobactam (ZOSYN) IVPB 3.375 g       3.375 g 12.5 mL/hr over 240 Minutes Intravenous Every 8 hours 02/28/12 1510     02/25/12 1109   dextrose 5 % with cefOXitin (MEFOXIN) ADS Med     Comments: HOLLIE, CHRISTINA: cabinet override         02/25/12 1109 02/25/12 2314   02/25/12 1100   cefOXitin (MEFOXIN) 1 g in dextrose 5 % 50 mL IVPB  Status:  Discontinued        1 g 100 mL/hr over 30 Minutes Intravenous 60 min pre-op 02/25/12 1100 02/25/12 1542           Assessment/Plan: s/p Procedure(s): DEBRIDEMENT CLOSURE/ABDOMINAL WOUND PAS Continue ABX therapy due to Post-op infection Continue foley due to patient critically ill, patient in ICU and urinary output monitoring Will probably lead to weaning today for extubation.  continue TPN for nutrition. WBC is high, and patient is hemoconcentrated.  Probably needs more volume resuscitation.  LOS: 8 days   Marta Lamas. [name], MD, FACS [phone] [facility] 03/03/2012

## 2012-03-03 NOTE — Progress Notes (Signed)
ANTIBIOTIC CONSULT NOTE - Follow Up  Pharmacy Consult for vancomycin and Zosyn Indication: pneumonia  Allergies  Allergen Reactions  . Lorazepam Other (See Comments)    Severe agitated delirium. Tolerates midazolam and other benzo's    Patient Measurements: Height: 6\' 2"  (188 cm) Weight: 177 lb 4 oz (80.4 kg) IBW/kg (Calculated) : 82.2   Vital Signs: Temp: 99.2 F (37.3 C) (05/17 0400) Temp src: Oral (05/17 0400) BP: 102/81 mmHg (05/17 0400) Pulse Rate: 114  (05/17 0400) Intake/Output from previous day: 05/16 0701 - 05/17 0700 In: 1748.5 [I.V.:917.5; IV Piggyback:191; TPN:640] Out: 835 [Urine:585; Blood:250] Intake/Output from this shift: Total I/O In: 240 [I.V.:120; TPN:120] Out: -   Labs:  Basename 03/03/12 0510 03/02/12 0500 03/01/12 1223 03/01/12 0500  WBC 23.8* 20.4* -- 12.5*  HGB 13.4 14.1 -- 12.0*  PLT 197 191 -- 142*  LABCREA -- -- -- --  CREATININE 1.02 0.79 0.73 --   Estimated Creatinine Clearance: 82.1 ml/min (by C-G formula based on Cr of 1.02).  Basename 03/03/12 0510  VANCOTROUGH 12.3  VANCOPEAK --  VANCORANDOM --  GENTTROUGH --  GENTPEAK --  GENTRANDOM --  TOBRATROUGH --  TOBRAPEAK --  TOBRARND --  AMIKACINPEAK --  AMIKACINTROU --  AMIKACIN --     Microbiology: Recent Results (from the past 720 hour(s))  SURGICAL PCR SCREEN     Status: Normal   Collection Time   02/25/12 10:31 AM      Component Value Range Status Comment   MRSA, PCR NEGATIVE  NEGATIVE  Final    Staphylococcus aureus NEGATIVE  NEGATIVE  Final   WOUND CULTURE     Status: Normal (Preliminary result)   Collection Time   02/29/12  9:22 AM      Component Value Range Status Comment   Specimen Description WOUND ABDOMEN INCISION SITE   Final    Special Requests NONE   Final    Gram Stain     Final    Value: FEW WBC PRESENT,BOTH PMN AND MONONUCLEAR     NO SQUAMOUS EPITHELIAL CELLS SEEN     RARE GRAM POSITIVE RODS     FEW GRAM NEGATIVE RODS   Culture MODERATE GRAM NEGATIVE  RODS   Final    Report Status PENDING   Incomplete   MRSA PCR SCREENING     Status: Normal   Collection Time   02/29/12  4:15 PM      Component Value Range Status Comment   MRSA by PCR NEGATIVE  NEGATIVE  Final   CULTURE, BLOOD (ROUTINE X 2)     Status: Normal (Preliminary result)   Collection Time   02/29/12  4:55 PM      Component Value Range Status Comment   Specimen Description BLOOD HAND LEFT   Final    Special Requests BOTTLES DRAWN AEROBIC ONLY 5CC   Final    Culture  Setup Time [id number]   Final    Culture     Final    Value:        BLOOD CULTURE RECEIVED NO GROWTH TO DATE CULTURE WILL BE HELD FOR 5 DAYS BEFORE ISSUING A FINAL NEGATIVE REPORT   Report Status PENDING   Incomplete   URINE CULTURE     Status: Normal (Preliminary result)   Collection Time   02/29/12  5:02 PM      Component Value Range Status Comment   Specimen Description URINE, CLEAN CATCH   Final    Special Requests NONE   Final  Culture  Setup Time [id number]   Final    Colony Count 20,OOO COLONIES/ML   Final    Culture ENTEROCOCCUS SPECIES   Final    Report Status PENDING   Incomplete   CULTURE, BLOOD (ROUTINE X 2)     Status: Normal (Preliminary result)   Collection Time   02/29/12  5:02 PM      Component Value Range Status Comment   Specimen Description BLOOD ARM RIGHT   Final    Special Requests BOTTLES DRAWN AEROBIC AND ANAEROBIC 10CC   Final    Culture  Setup Time [id number]   Final    Culture     Final    Value:        BLOOD CULTURE RECEIVED NO GROWTH TO DATE CULTURE WILL BE HELD FOR 5 DAYS BEFORE ISSUING A FINAL NEGATIVE REPORT   Report Status PENDING   Incomplete   CULTURE, RESPIRATORY     Status: Normal (Preliminary result)   Collection Time   02/29/12  7:22 PM      Component Value Range Status Comment   Specimen Description ENDOTRACHEAL   Final    Special Requests NONE   Final    Gram Stain     Final    Value: FEW WBC PRESENT,BOTH PMN AND MONONUCLEAR     NO SQUAMOUS EPITHELIAL CELLS  SEEN     NO ORGANISMS SEEN   Culture FEW GRAM NEGATIVE RODS   Final    Report Status PENDING   Incomplete     Medical History: Past Medical History  Diagnosis Date  . Hypertension   . Cancer   . Cancer of prostate     Medications:  Scheduled:     . albuterol  2.5 mg Nebulization Q6H  . antiseptic oral rinse  15 mL Mouth Rinse QID  . chlorhexidine  15 mL Mouth Rinse BID  . cloNIDine  0.3 mg Transdermal Weekly  . enoxaparin  40 mg Subcutaneous Q24H  . insulin aspart  0-9 Units Subcutaneous Q6H  . magnesium sulfate 1 - 4 g bolus IVPB  1 g Intravenous Once  . pantoprazole (PROTONIX) IV  40 mg Intravenous QHS  . piperacillin-tazobactam (ZOSYN)  IV  3.375 g Intravenous Q8H  . potassium chloride  10 mEq Intravenous Q1 Hr x 2  . potassium chloride  10 mEq Intravenous Q1 Hr x 4  . sodium chloride  10-40 mL Intracatheter Q12H  . vancomycin  1,250 mg Intravenous Q12H  . DISCONTD: sodium phosphate  Dextrose 5% IVPB  30 mmol Intravenous Once   Assessment: 66 yo with h/o ETOH abuse who was tx from [facility] for resp failure. He was admitted to [facility] for SBO and is s/p exlap on 5/10. Patient is on D5 Zosyn and D4 vancomycin for possible HCAP and wound infection. Vancomycin trough (12.3 mcg/mL) is below-goal on 1250mg  IV Q12H.   5/14 Abd incision site cx - mod GNR 5/14 Resp cx - few GNR 5/14 blood cx - ngtd 5/14 Urine: Enterococcus (20K) 5/14 MRSA PCR: neg  Goal of Therapy:  Vancomycin trough level 15-20 mcg/ml  Plan:  1. Increase vancomycin to 1750 mg IV Q12H.  2. Continue Zosyn at 3.375gm IV Q8H (4 hr infusion).   Emeline Gins 03/03/2012,6:05 AM

## 2012-03-03 NOTE — Progress Notes (Deleted)
Name: NATALIE LECLAIRE MRN: [id number] DOB: 04/21/1946    LOS: 8  PULMONARY / CRITICAL CARE MEDICINE  Pt Profile:   66 year old male w/ pmh of ETOH,  s/p expl lap for SBO due to adhesions on 5/10 at [facility]. Post-op course c/b delirium, persistent ileus distention and episodic desaturation due to suspected aspiration. Transferred to [facility] service for treatment of acute respiratory failure, concern for possible post-op aspiration and delirium. Intubated shortly after arrival.   Interval hx:  5/16: OR for fascial dehiscence repair  Vital Signs: Temp:  [97.4 F (36.3 C)-100.9 F (38.3 C)] 97.4 F (36.3 C) (05/17 1213) Pulse Rate:  [40-124] 111  (05/17 1300) Resp:  [23-39] 24  (05/17 1300) BP: (91-156)/(71-102) 98/76 mmHg (05/17 1200) SpO2:  [79 %-100 %] 94 % (05/17 1300) FiO2 (%):  [30 %-40 %] 40 % (05/17 1300)  Physical Examination: General: elderly man intubated  Neuro:  No focal deficits, GCS 10T, follows all comands HEENT:  ETT Neck:  Supple, no JVD   Cardiovascular: Tachy, reg, no M/R/G Lungs:  Diffuse rhonchi Abdomen:  Wound vac with retention, No BS, firm, distended,  tender diffusely, ABD in place  Musculoskeletal:  Moves all extremities, no pedal edema Skin:  No rash  Principal Problem:  *Respiratory failure following trauma and surgery Active Problems:  Aspiration pneumonia  Alcohol abuse  Ileus following gastrointestinal surgery  BP (high blood pressure)  Sinus tachycardia  Delirium, drug-induced  Agitation   ASSESSMENT AND PLAN  PULMONARY  Lab 03/01/12 0440 02/29/12 1852 02/29/12 1756 02/29/12 1720 02/29/12 0500  PHART 7.389 -- 7.343* -- 7.461*  PCO2ART 46.0* -- 42.3 -- 37.4  PO2ART 63.0* -- 87.0 -- 58.8*  HCO3 27.7* -- 22.9 -- 26.3*  O2SAT 91.0 73.2 96.0 71.9 90.3   Ventilator Settings: Vent Mode:  [-] PCV FiO2 (%):  [30 %-40 %] 40 % Set Rate:  [15 bmp] 15 bmp PEEP:  [5 cmH20] 5 cmH20 Pressure Support:  [5 cmH20] 5 cmH20 CXR:  5/17 Continued  right lower lobe consolidation  ETT:   5/14 >> 5/15  5/16 (in OR) >>   A:  Acute respiratory failure after aspiration - failed SBT AM 5/17 P:   -Continue vent support -daily WUA/PST -Continue ABX -Fentanyl drip -d/c propofol  CARDIOVASCULAR  Lab 03/02/12 0500 02/29/12 1630  TROPONINI -- <0.30  LATICACIDVEN -- 1.3  PROBNP 3782.0* --   ECG:   Lines:  left [place] CVL 5/14 >>  A: Septic shock/hypovolemic shock - resolved, hypertension, sinus tachycardia - reactive P:  - PRN hydralazine and metoprolol ordered - continue Catapres patch   RENAL  Lab 03/03/12 0510 03/02/12 0500 03/01/12 1223 02/29/12 1630 02/29/12 0457  NA 150* 152* 147* 147* 143  K 3.4* 3.4* -- -- --  CL 110 111 112 111 105  CO2 32 33* 26 27 28   BUN 26* 20 21 28* 30*  CREATININE 1.02 0.79 0.73 0.94 1.01  CALCIUM 8.1* 8.3* 7.9* 7.6* 8.6  MG 2.2 1.9 2.3 -- --  PHOS 2.8 1.6* 2.3 -- --   Intake/Output      05/16 0701 - 05/17 0700 05/17 0701 - 05/18 0700   I.V. (mL/kg) 1297.5 (16.1) 135 (1.7)   IV Piggyback 191 125   TPN 1000 120   Total Intake(mL/kg) 2488.5 (31) 380 (4.7)   Urine (mL/kg/hr) 1460 (0.8) 315 (0.6)   Emesis/NG output 500    Drains  125   Blood 250    Total Output 2210 440  Net +278.5 -60         Foley:  5/13 >>   A:  Hypokalmemia  P:   -replaced -monitor electrolytes   GASTROINTESTINAL  Lab 03/02/12 0500 02/29/12 1630  AST 25 32  ALT 24 27  ALKPHOS 78 45  BILITOT 0.6 0.7  PROT 6.1 5.5*  ALBUMIN 2.1* 1.9*   ABD Wound Vac 5/16 >>  CTAP 5/14: Residual dilatation of stomach and proximal small bowel with decompressed distal small bowel and colon. Changes could represent ileus or residual obstruction. Dilatation extends distal to the area of the anastomoses. Nonspecific wall thickening of jejunal loops may represent edema. No free air or free fluid to suggest perforation.   A:  Post op ileus s/p exploratory lap on 5/10 P:   - Cont gastric decompression with NGT - Continue  TPN, keep NPO  A: Fascial dehiscence - closure of fascia, wound vac 5/16 P: -Per CCS   HEMATOLOGIC  Lab 03/03/12 0510 03/02/12 0500 03/01/12 0500 02/29/12 1630 02/29/12 0457  HGB 13.4 14.1 12.0* 12.4* 13.9  HCT 38.8* 41.5 35.4* 36.0* 41.1  PLT 197 191 142* 122* 162  INR -- -- -- 1.16 --  APTT -- -- -- 21* --   A:  Mild Anemia - no evidence of acute blood loss. No indication for PRBCs P:  -Monitor CBC intermittently   INFECTIOUS  Lab 03/03/12 0510 03/02/12 0500 03/01/12 0500 02/29/12 1630 02/29/12 0457  WBC 23.8* 20.4* 12.5* 13.4* 14.0*  PROCALCITON -- 18.16 -- 52.60 --   Cultures: BCX2 5/14 >> UC 5/14 >> Sputum 5/14 >> GNRs >> Klebsiella pneumoniae Wound 5/14 >> GNRs >>Klebsiella Pneumoniae & Enterobacter Cloacae   Antibiotics: nosocomial asp vanc 5/14 >> 5/17 Zosyn 5/13 >>   A:  Aspiration PNA, wound dehiscence with superficial infection P:   - abx as above  ENDOCRINE  Lab 03/03/12 0626 03/02/12 2359 03/02/12 1751 03/02/12 1149 03/02/12 0605  GLUCAP 119* 134* 135* 154* 118*   A:  No h/o DM, mild hyperglycemia on TPN P:   - Cont CBGs/SSI   NEUROLOGIC  A:  Acute encephalopathy - markedly improved. Appears to have had adverse reaction to lorazepam but has tolerated midazolam.  Post-operative pain P:   - PRN Versed and fentanyl - Cont thiamine and folate with TPN     BEST PRACTICE / DISPOSITION - Level of Care:  icu - Primary Service:  pccm - Consultants:  CCS - Code Status:  full - Diet:  TPN - DVT Px:  lmwh - GI Px:  ppi - Social / Family: no family available    35 mins CCM time   Billy Fischer,   03/03/2012, 1:15 PM

## 2012-03-03 NOTE — Progress Notes (Signed)
Name: Michael Ellison MRN: [id number] DOB: [birth year]    LOS: 8  PULMONARY / CRITICAL CARE MEDICINE  Pt Profile:   66 year old male w/ pmh of ETOH,  s/p expl lap for SBO due to adhesions on 5/10 at [facility]. Post-op course c/b delirium, persistent ileus distention and episodic desaturation due to suspected aspiration. Transferred to [facility] service for treatment of acute respiratory failure, concern for possible post-op aspiration and delirium. Intubated shortly after arrival.   Interval hx:  5/16: OR for fascial dehiscence repair  Vital Signs: Temp:  [97.5 F (36.4 C)-100.9 F (38.3 C)] 97.5 F (36.4 C) (05/17 0816) Pulse Rate:  [40-124] 116  (05/17 0600) Resp:  [23-39] 28  (05/17 0600) BP: (91-157)/(71-111) 147/90 mmHg (05/17 0600) SpO2:  [79 %-100 %] 93 % (05/17 0600) FiO2 (%):  [30 %-50 %] 30 % (05/17 0600)  Physical Examination: General: elderly man intubated  Neuro:  No focal deficits, GCS 10T, follows all comands HEENT:  ETT Neck:  Supple, no JVD   Cardiovascular: Tachy, reg, no M/R/G Lungs:  Diffuse rhonchi Abdomen:  Wound vac with retention, No BS, firm, distended,  tender diffusely, ABD in place  Musculoskeletal:  Moves all extremities, no pedal edema Skin:  No rash  Principal Problem:  *Respiratory failure following trauma and surgery Active Problems:  Aspiration pneumonia  Alcohol abuse  Ileus following gastrointestinal surgery  BP (high blood pressure)  Sinus tachycardia  Delirium, drug-induced  Agitation   ASSESSMENT AND PLAN  PULMONARY  Lab 03/01/12 0440 02/29/12 1852 02/29/12 1756 02/29/12 1720 02/29/12 0500  PHART 7.389 -- 7.343* -- 7.461*  PCO2ART 46.0* -- 42.3 -- 37.4  PO2ART 63.0* -- 87.0 -- 58.8*  HCO3 27.7* -- 22.9 -- 26.3*  O2SAT 91.0 73.2 96.0 71.9 90.3   Ventilator Settings: Vent Mode:  [-] PCV FiO2 (%):  [30 %-50 %] 30 % Set Rate:  [15 bmp-20 bmp] 15 bmp Vt Set:  [500 mL] 500 mL PEEP:  [5 cmH20] 5 cmH20 Plateau Pressure:  [20  cmH20] 20 cmH20 CXR:  5/17 Continued right lower lobe consolidation  ETT:  5/14  A:  Acute respiratory failure after aspiration of bilious emesis P:   -Continue vent support -daily WUA/PST -Continue ABX -Fentanyl drip -d/c propofol  CARDIOVASCULAR  Lab 03/02/12 0500 02/29/12 1630  TROPONINI -- <0.30  LATICACIDVEN -- 1.3  PROBNP 3782.0* --   ECG:   Lines:  left [place] CVL 5/14>>>  A: Septic shock/hypovolemic shock - resolved, hypertension, sinus tachycardia - reactive P:  - PRN hydralazine and metoprolol ordered -continue Catapres patch   RENAL  Lab 03/03/12 0510 03/02/12 0500 03/01/12 1223 02/29/12 1630 02/29/12 0457  NA 150* 152* 147* 147* 143  K 3.4* 3.4* -- -- --  CL 110 111 112 111 105  CO2 32 33* 26 27 28   BUN 26* 20 21 28* 30*  CREATININE 1.02 0.79 0.73 0.94 1.01  CALCIUM 8.1* 8.3* 7.9* 7.6* 8.6  MG 2.2 1.9 2.3 -- --  PHOS 2.8 1.6* 2.3 -- --   Intake/Output      05/16 0701 - 05/17 0700 05/17 0701 - 05/18 0700   I.V. (mL/kg) 1252.5 (15.6)    IV Piggyback 191    TPN 960    Total Intake(mL/kg) 2403.5 (29.9)    Urine (mL/kg/hr) 1460 (0.8)    Emesis/NG output 500    Blood 250    Total Output 2210    Net +193.5  Foley:  5/13  A:  Hypokalmemia  P:   -replaced -monitor electrolytes   GASTROINTESTINAL  Lab 03/02/12 0500 02/29/12 1630  AST 25 32  ALT 24 27  ALKPHOS 78 45  BILITOT 0.6 0.7  PROT 6.1 5.5*  ALBUMIN 2.1* 1.9*   ABD Wound Vac 5/16>>>  CTAP 5/14: Residual dilatation of stomach and proximal small bowel with decompressed distal small bowel and colon. Changes could represent ileus or residual obstruction. Dilatation extends distal to the area of the anastomoses. Nonspecific wall thickening of jejunal loops may represent edema. No free air or free fluid to suggest perforation.   A:  Post op ileus s/p exploratory lap on 5/10 P:   - Cont gastric decompression with NGT - Continue TPN, keep NPO  A: Fascial dehiscence - closure of  fascia, wound vac 5/16 P: -Per CCS    HEMATOLOGIC  Lab 03/03/12 0510 03/02/12 0500 03/01/12 0500 02/29/12 1630 02/29/12 0457  HGB 13.4 14.1 12.0* 12.4* 13.9  HCT 38.8* 41.5 35.4* 36.0* 41.1  PLT 197 191 142* 122* 162  INR -- -- -- 1.16 --  APTT -- -- -- 21* --   A:  Mild Anemia - no evidence of acute blood loss. No indication for PRBCs P:  -Monitor CBC intermittently   INFECTIOUS  Lab 03/03/12 0510 03/02/12 0500 03/01/12 0500 02/29/12 1630 02/29/12 0457  WBC 23.8* 20.4* 12.5* 13.4* 14.0*  PROCALCITON -- 18.16 -- 52.60 --   Cultures: BCX2 5/14 >> UC 5/14 >> Sputum 5/14 >> GNRs >> Klebsiella pneumoniae Wound 5/14 >> GNRs >>Klebsiella Pneumoniae & Enterobacter Cloacae   Antibiotics: nosocomial asp vanc 5/14 >> 5/17 Zosyn 5/13 >>   A:  Aspiration PNA  P:   -continue zosyn   A: SIRS P: -continue Zosyn -monitor hemodynamics  ENDOCRINE  Lab 03/03/12 0626 03/02/12 2359 03/02/12 1751 03/02/12 1149 03/02/12 0605  GLUCAP 119* 134* 135* 154* 118*   A:  No h/o DM P:   -check CBG q 4- well controled   NEUROLOGIC  A:  Acute encephalopathy - markedly improved. Appears to have had adverse reaction to lorazepam but has tolerated midazolam.  Post-operative pain P:   - PRN Versed and fentanyl - Cont thiamine and folate with TPN     BEST PRACTICE / DISPOSITION - Level of Care:  icu - Primary Service:  pccm - Consultants:  CCS - Code Status:  full - Diet:  TPN - DVT Px:  lmwh - GI Px:  ppi - Social / Family: no family available     BABCOCK,PETE,   03/03/2012, 8:51 AM   Seen on CCM rounds this morning with resident MD or ACNP above.  Pt examined and database reviewed. I agree with above findings, assessment and plan as reflected in the note above. 35 mins CCM time  Billy Fischer, MD;  PCCM service; Mobile [phone]

## 2012-03-04 ENCOUNTER — Encounter (HOSPITAL_COMMUNITY): Payer: Self-pay | Admitting: Emergency Medicine

## 2012-03-04 DIAGNOSIS — IMO0002 Reserved for concepts with insufficient information to code with codable children: Secondary | ICD-10-CM

## 2012-03-04 DIAGNOSIS — J9600 Acute respiratory failure, unspecified whether with hypoxia or hypercapnia: Secondary | ICD-10-CM

## 2012-03-04 DIAGNOSIS — E46 Unspecified protein-calorie malnutrition: Secondary | ICD-10-CM

## 2012-03-04 LAB — URINE CULTURE: Culture  Setup Time: 201305150241

## 2012-03-04 LAB — GLUCOSE, CAPILLARY
Glucose-Capillary: 120 mg/dL — ABNORMAL HIGH (ref 70–99)
Glucose-Capillary: 114 mg/dL — ABNORMAL HIGH (ref 70–99)
Glucose-Capillary: 108 mg/dL — ABNORMAL HIGH (ref 70–99)

## 2012-03-04 LAB — CARDIAC PANEL(CRET KIN+CKTOT+MB+TROPI)
Relative Index: 0.7 (ref 0.0–2.5)
Total CK: 320 U/L — ABNORMAL HIGH (ref 7–232)
Troponin I: <0.3 ng/mL (ref <0.30)

## 2012-03-04 LAB — BASIC METABOLIC PANEL
CO2: 32 mEq/L (ref 19–32)
Calcium: 8.2 mg/dL — ABNORMAL LOW (ref 8.4–10.5)
Chloride: 116 mEq/L — ABNORMAL HIGH (ref 96–112)
Chloride: 115 mEq/L — ABNORMAL HIGH (ref 96–112)
Creatinine, Ser: 0.94 mg/dL (ref 0.50–1.35)
GFR calc Af Amer: >90 mL/min (ref >90)
Glucose, Bld: 122 mg/dL — ABNORMAL HIGH (ref 70–99)
Potassium: 3.9 mEq/L (ref 3.5–5.1)
Sodium: 153 mEq/L — ABNORMAL HIGH (ref 135–145)
Sodium: 152 mEq/L — ABNORMAL HIGH (ref 135–145)

## 2012-03-04 LAB — URINALYSIS, ROUTINE W REFLEX MICROSCOPIC
Ketones, ur: NEGATIVE mg/dL
Nitrite: NEGATIVE
Specific Gravity, Urine: 1.014 (ref 1.005–1.030)
pH: 8 (ref 5.0–8.0)

## 2012-03-04 MED ORDER — THIAMINE HCL 100 MG/ML IJ SOLN
INTRAVENOUS | Status: AC
  Administered 2012-03-04: 17:00:00 via INTRAVENOUS
  Filled 2012-03-04: qty 2000

## 2012-03-04 MED ORDER — POTASSIUM CHLORIDE 10 MEQ/50ML IV SOLN
INTRAVENOUS | Status: AC
  Filled 2012-03-04: qty 50

## 2012-03-04 MED ORDER — DEXTROSE 5 % IV SOLN
INTRAVENOUS | Status: DC
  Administered 2012-03-04: 09:00:00 via INTRAVENOUS

## 2012-03-04 MED ORDER — DEXTROSE 5 % IV SOLN
INTRAVENOUS | Status: DC
  Administered 2012-03-04 – 2012-03-07 (×2): via INTRAVENOUS
  Administered 2012-03-07: 1000 mL via INTRAVENOUS

## 2012-03-04 MED ORDER — POTASSIUM CHLORIDE 10 MEQ/50ML IV SOLN
10.0000 meq | INTRAVENOUS | Status: AC
  Administered 2012-03-04 (×4): 10 meq via INTRAVENOUS

## 2012-03-04 NOTE — Progress Notes (Signed)
PARENTERAL NUTRITION CONSULT NOTE - FOLLOW UP   Pharmacy Consult for TPN Indication: Prolonged ileus s/p SBR for SBO 5/10  Allergies  Allergen Reactions  . Lorazepam Other (See Comments)    Severe agitated delirium. Tolerates midazolam and other benzo's    Patient Measurements: Height: 6\' 2"  (188 cm) Weight: 178 lb 9.2 oz (81 kg) IBW/kg (Calculated) : 82.2  Usual Weight: ~82 kg  Vital Signs: Temp: 98.2 F (36.8 C) (05/18 0423) Temp src: Oral (05/18 0423) BP: 130/80 mmHg (05/18 0737) Pulse Rate: 102  (05/18 0737) Intake/Output from previous day: 05/17 0701 - 05/18 0700 In: 2545 [I.V.:835; NG/GT:90; IV Piggyback:350; TPN:1270] Out: 2775 [Urine:1625; Emesis/NG output:1000; Drains:150] Intake/Output from this shift:    Labs:  Basename 03/03/12 0510 03/02/12 0500  WBC 23.8* 20.4*  HGB 13.4 14.1  HCT 38.8* 41.5  PLT 197 191  APTT -- --  INR -- --     Basename 03/04/12 0511 03/03/12 0510 03/02/12 0500 03/01/12 1223  NA 153* 150* 152* --  K 3.6 3.4* 3.4* --  CL 116* 110 111 --  CO2 32 32 33* --  GLUCOSE 122* 128* 112* --  BUN 25* 26* 20 --  CREATININE 1.02 1.02 0.79 --  LABCREA -- -- -- --  CREAT24HRUR -- -- -- --  CALCIUM 8.2* 8.1* 8.3* --  MG -- 2.2 1.9 2.3  PHOS -- 2.8 1.6* 2.3  PROT -- -- 6.1 --  ALBUMIN -- -- 2.1* --  AST -- -- 25 --  ALT -- -- 24 --  ALKPHOS -- -- 78 --  BILITOT -- -- 0.6 --  BILIDIR -- -- -- --  IBILI -- -- -- --  PREALBUMIN -- -- <3.0* --  TRIG -- -- 144 --  CHOLHDL -- -- -- --  CHOL -- -- -- --   Estimated Creatinine Clearance: 82.7 ml/min (by C-G formula based on Cr of 1.02).    Basename 03/04/12 0516 03/04/12 0025 03/03/12 2011  GLUCAP 114* 120* 122*    Insulin Requirements in the past 24 hours:  1 unit Novolog  Nutritional Goals:  1985 kCal, 105-120 grams of protein per day, 2-2.3L  Current Nutrition:  Clinimix E 5/20 at 35ml/hr + lipids 47ml/hr M/W/F Goal: Clinimix E 5/20 at 83 ml/hr + lipids 19ml/hr M/W/F  providing 1960 kcal; 100 gm protein  Assessment: 66 y.o. Male on TPN for prolonged ileus s/p SBR for SBO on 5/10 at [facility]. Noted PMH of alcohol abuse.  Pt NPO x 6 days and mild refeeding observed post TPN initiated 03/01/12. Refeeding better past replacement of lytes. Baseline prealbumin <3.  GI: SBO s/p resection 5/10 at [facility], ileus on bowel rest. NPO x 6 days. NGT o/p increased to 1000. S/p OR 5/16 for debridement/abd closure. Endo: No h/o DM. CBGs WNL on SSI; plan to d/c SSI/CBG checks if CBGs remain WNL at goal TPN rate. Lytes:  Na+ remains up -  Probably due to fluid status. Pt continues with low-nl K (goal ~4 for ileus). Others wnl. Renal: SCr stable, UOP 0.8 ml/kg/hr Pulm: reintubated post OR 5/16 - albuterol nebs Cards:  hx HTN - BP okay. HR 94-118 - clonidine patch, lopressor and hydralazine PRN Hepatobil: LFTs WNL Neuro: acute encephalopathy (ativan allergy); fent gtt ID: Zosyn D#[id number] for Kleb PNA, presumed aspiration/Kleb and Enterobacter in wound/20 kcol enterococcus in urine. Tm 99.3, WBC trending up Best Practices:  Lovenox, PPI IV, MC  Plan:  - Increase Clinimix to E 5/20 to goal 31ml/hr  -  Will change KVO fluids to D5W as Na continues to be high - Multivitamins, trace elements, and lipids on M/W/F due to national shortage. - Thiamine and folic acid daily in TPN - KCl IV x 4 runs - F/U AM labs  Christoper Fabian, PharmD, BCPS Clinical pharmacist, pager [phone] 03/04/2012, 7:47 AM

## 2012-03-04 NOTE — Evaluation (Signed)
Physical Therapy Evaluation Patient Details Name: Michael Ellison MRN: [id number] DOB: [birth year] Today's Date: 03/04/2012 Time: 1135-1150 PT Time Calculation (min): 15 min  PT Assessment / Plan / Recommendation Clinical Impression  Patient s/p VDRF with decr mobility secondary to decr strength and endurance since patient has been on bedrest.  Full assessment to follow once patient tolerates EOB activity.  MD:  Please advise when PT can progress patient to EOB activities.  Continue PT.  Feel patient will do well once extubated.      PT Assessment  Patient needs continued PT services    Follow Up Recommendations   (TBA once patient mobility is fully assessed. )    Barriers to Discharge  TBA      lEquipment Recommendations   (TBA)    Recommendations for Other Services OT consult   Frequency Min 3X/week    Precautions / Restrictions Precautions Precaution Comments: TBA Restrictions Weight Bearing Restrictions: No   Pertinent Vitals/Pain HR, RR increased, desat with activity, no pain      Mobility  Bed Mobility Bed Mobility: Not assessed Transfers Transfers: Not assessed Ambulation/Gait Ambulation/Gait Assistance: Not tested (comment) Stairs: No Wheelchair Mobility Wheelchair Mobility: No    Exercises General Exercises - Upper Extremity Shoulder Flexion: AAROM;Both;5 reps;Supine Shoulder ABduction: AAROM;Both;5 reps;Supine Shoulder Horizontal ABduction: AAROM;Both;5 reps;Supine Shoulder Horizontal ADduction: AAROM;Both;5 reps;Supine Elbow Flexion: AROM;Both;5 reps;Supine Elbow Extension: AROM;Both;5 reps;Supine Wrist Flexion: AROM;Both;5 reps;Supine Wrist Extension: AROM;Both;5 reps;Supine General Exercises - Lower Extremity Ankle Circles/Pumps: AROM;Both;10 reps;Supine Quad Sets: AROM;Both;10 reps;Supine Heel Slides: AROM;Both;10 reps;Supine Hip ABduction/ADduction: AROM;Both;10 reps;Supine   PT Diagnosis: Generalized weakness  PT Problem List: Decreased  activity tolerance;Decreased balance;Decreased mobility;Decreased knowledge of use of DME;Decreased safety awareness PT Treatment Interventions: DME instruction;Gait training;Functional mobility training;Therapeutic activities;Therapeutic exercise;Balance training;Patient/family education   PT Goals Acute Rehab PT Goals PT Goal Formulation: With patient Time For Goal Achievement: 03/18/12 Potential to Achieve Goals: Good Pt will go Supine/Side to Sit: Independently PT Goal: Supine/Side to Sit - Progress: Goal set today Pt will Sit at Edge of Bed: Independently;6-10 min;with no upper extremity support PT Goal: Sit at Edge Of Bed - Progress: Goal set today Pt will go Sit to Stand: with supervision;without upper extremity assist PT Goal: Sit to Stand - Progress: Goal set today Pt will go Stand to Sit: with supervision;without upper extremity assist PT Goal: Stand to Sit - Progress: Goal set today Pt will Transfer Bed to Chair/Chair to Bed: with supervision PT Transfer Goal: Bed to Chair/Chair to Bed - Progress: Goal set today Pt will Ambulate: 16 - 50 feet;with supervision;with least restrictive assistive device PT Goal: Ambulate - Progress: Goal set today  Visit Information  Last PT Received On: 03/04/12 Assistance Needed: +2 (when able to mobilize)    Subjective Data  Subjective: PAtient orally intubated.  Nods head to yes/no questions.  Appears to be accurate. Patient Stated Goal: Unable to assess fully.   Prior Functioning  Home Living Lives With: Alone Additional Comments: Patient shook head that he lives alone.  Unsure of home status secondary to orally intubated.  Will continue questions when patient extubated.   Prior Function Level of Independence: Independent Communication Communication:  (ventilator)    Cognition  Overall Cognitive Status: Difficult to assess Difficult to assess due to: Intubated Arousal/Alertness: Awake/alert Orientation Level: Appears intact for  tasks assessed Behavior During Session: Anxious (Desat with activity and gets anxious, needed support)    Extremity/Trunk Assessment Right Upper Extremity Assessment RUE ROM/Strength/Tone: Within functional levels RUE Sensation: WFL -  Light Touch RUE Coordination: WFL - gross/fine motor Left Upper Extremity Assessment LUE ROM/Strength/Tone: Within functional levels LUE Sensation: WFL - Light Touch LUE Coordination: WFL - gross/fine motor Right Lower Extremity Assessment RLE ROM/Strength/Tone: Within functional levels RLE Sensation: WFL - Light Touch RLE Coordination: WFL - gross/fine motor Left Lower Extremity Assessment LLE ROM/Strength/Tone: Within functional levels LLE Sensation: WFL - Light Touch LLE Coordination: WFL - gross/fine motor Trunk Assessment Trunk Assessment: Normal   Balance    End of Session PT - End of Session Activity Tolerance: Patient limited by fatigue Patient left: in bed;with call bell/phone within reach;with bed alarm set;with nursing in room Nurse Communication: Need for lift equipment   INGOLD,Alahna Dunne 03/04/2012, 12:09 PM  [facility] [phone] (pager)

## 2012-03-04 NOTE — Progress Notes (Signed)
Patient ID: Michael Ellison, male   DOB: November 11, 1945, 66 y.o.   MRN: [id number] CCS/ 2 Days Post-Op  Subjective: Patient is alert on the ventilator.  Denies pain.  Is short of breath.  Objective: Vital signs in last 24 hours: Temp:  [97.4 F (36.3 C)-99.3 F (37.4 C)] 98.1 F (36.7 C) (05/18 0754) Pulse Rate:  [94-118] 102  (05/18 0737) Resp:  [17-30] 27  (05/18 0600) BP: (98-140)/(69-102) 130/80 mmHg (05/18 0737) SpO2:  [89 %-99 %] 92 % (05/18 0722) FiO2 (%):  [30 %-40 %] 40 % (05/18 0737) Weight:  [178 lb 9.2 oz (81 kg)] 178 lb 9.2 oz (81 kg) (05/18 0400) Last BM Date: 02/28/12  Intake/Output from previous day: 05/17 0701 - 05/18 0700 In: 2545 [I.V.:835; NG/GT:90; IV Piggyback:350; TPN:1270] Out: 2775 [Urine:1625; Emesis/NG output:1000; Drains:150] Intake/Output this shift:    General: Mild distress, tachypnea Lungs: Coarse breath sounds bilaterally  Abd: soft, moderate distention, + bowel sounds.   Extremities: No DVT signs   Lab Results:   BMET  Basename 03/04/12 0511 03/03/12 0510  NA 153* 150*  K 3.6 3.4*  CL 116* 110  CO2 32 32  GLUCOSE 122* 128*  BUN 25* 26*  CREATININE 1.02 1.02  CALCIUM 8.2* 8.1*   PT/INR No results found for this basename: LABPROT:2,INR:2 in the last 72 hours ABG No results found for this basename: PHART:2,PCO2:2,PO2:2,HCO3:2 in the last 72 hours  Studies/Results: Dg Chest Port 1 View  03/03/2012  *RADIOLOGY REPORT*  Clinical Data: Follow-up respiratory failure  PORTABLE CHEST - 1 VIEW  Comparison: Chest radiograph 03/02/2012  Findings: Endotracheal tube is 5 cm from carina in good position. NG tube and left central venous line are unchanged.  Stable cardiac silhouette.  There is right pleural effusion and basilar atelectasis which is slightly improved compared to prior.  Milder left effusion and atelectasis again demonstrated.  There is bullous change in the upper lobes.  IMPRESSION:  1. Endotracheal tube in good position. 2.   Improvement aeration to the lung bases.  Persistent bilateral pleural effusions and basilar atelectasis worse on the right. 3.  Bullous change at the apices.  Original Report Authenticated By: Genevive Bi, M.D.    Anti-infectives: Anti-infectives     Start     Dose/Rate Route Frequency Ordered Stop   03/03/12 1800   vancomycin (VANCOCIN) 1,750 mg in sodium chloride 0.9 % 500 mL IVPB  Status:  Discontinued        1,750 mg 250 mL/hr over 120 Minutes Intravenous Every 12 hours 03/03/12 0628 03/03/12 0918   02/29/12 1900   micafungin (MYCAMINE) 100 mg in sodium chloride 0.9 % 100 mL IVPB  Status:  Discontinued        100 mg 100 mL/hr over 1 Hours Intravenous Every 24 hours 02/29/12 1754 03/01/12 0933   02/29/12 1730   vancomycin (VANCOCIN) 1,250 mg in sodium chloride 0.9 % 250 mL IVPB  Status:  Discontinued        1,250 mg 166.7 mL/hr over 90 Minutes Intravenous Every 12 hours 02/29/12 1644 03/03/12 0628   02/29/12 1715   vancomycin (VANCOCIN) IVPB 1000 mg/200 mL premix  Status:  Discontinued        1,000 mg 200 mL/hr over 60 Minutes Intravenous  Once 02/29/12 1637 02/29/12 1647   02/29/12 1645   piperacillin-tazobactam (ZOSYN) IVPB 3.375 g  Status:  Discontinued        3.375 g 100 mL/hr over 30 Minutes Intravenous  Once 02/29/12 1637 02/29/12  1641   02/28/12 1800   piperacillin-tazobactam (ZOSYN) IVPB 3.375 g  Status:  Discontinued        3.375 g 12.5 mL/hr over 240 Minutes Intravenous 4 times per day 02/28/12 1454 02/28/12 1510   02/28/12 1800   piperacillin-tazobactam (ZOSYN) IVPB 3.375 g        3.375 g 12.5 mL/hr over 240 Minutes Intravenous Every 8 hours 02/28/12 1510     02/25/12 1109   dextrose 5 % with cefOXitin (MEFOXIN) ADS Med     Comments: HOLLIE, CHRISTINA: cabinet override         02/25/12 1109 02/25/12 2314   02/25/12 1100   cefOXitin (MEFOXIN) 1 g in dextrose 5 % 50 mL IVPB  Status:  Discontinued        1 g 100 mL/hr over 30 Minutes Intravenous 60 min pre-op  02/25/12 1100 02/25/12 1542          Assessment/Plan: s/p Procedure(s): DEBRIDEMENT CLOSURE/ABDOMINAL WOUND for wound dehiscence  PAS Continue ABX therapy due to Post-op infection Continue foley due to patient critically ill, patient in ICU and urinary output monitoring VDRF -  Weaning, but does not look comfortable on 5/5 PSV.   Continue TPN for nutrition. Hypernatremia, probably still volume depleted.   Leukocytosis - WBCs pending today.    LOS: 9 days   03/04/2012

## 2012-03-04 NOTE — Progress Notes (Signed)
Name: Michael Ellison MRN: [id number] DOB: 19-Dec-1945    LOS: 9  PULMONARY / CRITICAL CARE MEDICINE  Pt Profile:   66 year old male w/ pmh of ETOH,  s/p expl lap for SBO due to adhesions on 5/10 at [facility]. Post-op course c/b delirium, persistent ileus distention and episodic desaturation due to suspected aspiration. Transferred to [facility] service for treatment of acute respiratory failure, concern for possible post-op aspiration and delirium. Intubated shortly after arrival.   Interval hx:  5/16: OR for fascial dehiscence repair  Vital Signs: Temp:  [97.4 F (36.3 C)-99.3 F (37.4 C)] 98.1 F (36.7 C) (05/18 0754) Pulse Rate:  [94-118] 96  (05/18 1000) Resp:  [17-30] 24  (05/18 1000) BP: (98-165)/(69-102) 140/87 mmHg (05/18 1000) SpO2:  [89 %-99 %] 98 % (05/18 1000) FiO2 (%):  [40 %] 40 % (05/18 0830) Weight:  [81 kg (178 lb 9.2 oz)] 81 kg (178 lb 9.2 oz) (05/18 0400)  Physical Examination: General: elderly man intubated  Neuro:  No focal deficits, GCS 10T, follows all comands HEENT:  ETT Neck:  Supple, no JVD   Cardiovascular: Tachy, reg, no M/R/G Lungs:  Diffuse rhonchi Abdomen:  Wound vac with retention, No BS, firm, distended,  tender diffusely, ABD in place  Musculoskeletal:  Moves all extremities, no pedal edema Skin:  No rash  Principal Problem:  *Respiratory failure following trauma and surgery Active Problems:  Aspiration pneumonia  Alcohol abuse  Ileus following gastrointestinal surgery  BP (high blood pressure)  Sinus tachycardia  Delirium, drug-induced  Agitation   ASSESSMENT AND PLAN  PULMONARY  Lab 03/01/12 0440 02/29/12 1852 02/29/12 1756 02/29/12 1720 02/29/12 0500  PHART 7.389 -- 7.343* -- 7.461*  PCO2ART 46.0* -- 42.3 -- 37.4  PO2ART 63.0* -- 87.0 -- 58.8*  HCO3 27.7* -- 22.9 -- 26.3*  O2SAT 91.0 73.2 96.0 71.9 90.3   Ventilator Settings: Vent Mode:  [-] PCV FiO2 (%):  [40 %] 40 % Set Rate:  [15 bmp] 15 bmp PEEP:  [5 cmH20] 5  cmH20 Pressure Support:  [5 cmH20] 5 cmH20 Plateau Pressure:  [18 cmH20] 18 cmH20 CXR:  5/17 Continued right lower lobe consolidation  ETT:  5/14>>>  A:  Acute respiratory failure after aspiration of bilious emesis P:   -Continue vent support- failed 5/5 trial today on cpap 5ps 5, also need furtehr info from surgeon on return to or needed?? Need secretions oral improveemnt -daily WUA/PST -Continue ABX -Fentanyl drip, and PRN Versed  CARDIOVASCULAR  Lab 03/02/12 0500 02/29/12 1630  TROPONINI -- <0.30  LATICACIDVEN -- 1.3  PROBNP 3782.0* --   ECG:   Lines:  left [place] CVL 5/14>>>  A: Septic shock/hypovolemic shock - resolved,  P:  -monitor tele  A:Hypertension, intermittent tachycardia  P: -PRN hydralazine and metoprolol ordered -continue Catapres patch  RENAL  Lab 03/04/12 0511 03/03/12 0510 03/02/12 0500 03/01/12 1223 02/29/12 1630  NA 153* 150* 152* 147* 147*  K 3.6 3.4* -- -- --  CL 116* 110 111 112 111  CO2 32 32 33* 26 27  BUN 25* 26* 20 21 28*  CREATININE 1.02 1.02 0.79 0.73 0.94  CALCIUM 8.2* 8.1* 8.3* 7.9* 7.6*  MG -- 2.2 1.9 2.3 --  PHOS -- 2.8 1.6* 2.3 --   Intake/Output      05/17 0701 - 05/18 0700 05/18 0701 - 05/19 0700   I.V. (mL/kg) 855 (10.6) 80 (1)   NG/GT 90 20   IV Piggyback 350 112.5   TPN 1340  210   Total Intake(mL/kg) 2635 (32.5) 422.5 (5.2)   Urine (mL/kg/hr) 1625 (0.8) 375   Emesis/NG output 1000    Drains 150    Blood     Total Output 2775 375   Net -140 +47.5         Foley:  5/13  A:  Hypokalmemia  P:   -replaced -monitor electrolytes  A: hypernatremia -free water deficit 4.5 P: - Add D5W @ 60 ml/hr, bmet in pm   GASTROINTESTINAL  Lab 03/02/12 0500 02/29/12 1630  AST 25 32  ALT 24 27  ALKPHOS 78 45  BILITOT 0.6 0.7  PROT 6.1 5.5*  ALBUMIN 2.1* 1.9*   ABD Wound Vac 5/16>>>  CTAP 5/14: Residual dilatation of stomach and proximal small bowel with decompressed distal small bowel and colon. Changes could represent  ileus or residual obstruction. Dilatation extends distal to the area of the anastomoses. Nonspecific wall thickening of jejunal loops may represent edema. No free air or free fluid to suggest perforation.   A:  Post op ileus s/p exploratory lap on 5/10 P:   - Cont gastric decompression with NGT - Continue TPN, keep NPO Will d/w surgery further  A: Fascial dehiscence - closure of fascia, wound vac 5/16 P: -Per CCS  HEMATOLOGIC  Lab 03/03/12 0510 03/02/12 0500 03/01/12 0500 02/29/12 1630 02/29/12 0457  HGB 13.4 14.1 12.0* 12.4* 13.9  HCT 38.8* 41.5 35.4* 36.0* 41.1  PLT 197 191 142* 122* 162  INR -- -- -- 1.16 --  APTT -- -- -- 21* --   A:  Mild Anemia - no evidence of acute blood loss. No indication for PRBCs P:  -Monitor CBC intermittently -lovenox, follow crt trend   INFECTIOUS  Lab 03/03/12 0510 03/02/12 0500 03/01/12 0500 02/29/12 1630 02/29/12 0457  WBC 23.8* 20.4* 12.5* 13.4* 14.0*  PROCALCITON -- 18.16 -- 52.60 --   Cultures: BCX2 5/14 >> UC 5/14 >>Enterococcus  Sputum 5/14 >> GNRs >> Klebsiella pneumoniae Wound 5/14 >> GNRs >>Klebsiella Pneumoniae & Enterobacter Cloacae   Antibiotics: nosocomial asp vanc 5/14 >> 5/17 Zosyn 5/13 >>   A:  Aspiration PNA  P:   -continue zosyn for 24 hrs further, also covers entroccus UA in am repeat Avoid foleys  A: SIRS P: -continue Zosyn -monitor hemodynamics  ENDOCRINE  Lab 03/04/12 0516 03/04/12 0025 03/03/12 2011 03/03/12 1627 03/03/12 1212  GLUCAP 114* 120* 122* 113* 127*   A:  No h/o DM P:   -check CBG q 4- well controled   NEUROLOGIC  A:  Acute encephalopathy - markedly improved. Appears to have had adverse reaction to lorazepam but has tolerated midazolam.  Post-operative pain P:   - PRN Versed and fentanyl - Cont thiamine and folate with TPN   Ccm 30 min    BEST PRACTICE / DISPOSITION - Level of Care:  icu - Primary Service:  pccm - Consultants:  CCS - Code Status:  full - Diet:  TPN - DVT  Px:  lmwh - GI Px:  ppi - Social / Family: no family available

## 2012-03-05 ENCOUNTER — Inpatient Hospital Stay (HOSPITAL_COMMUNITY): Payer: Medicare Other

## 2012-03-05 LAB — POCT I-STAT 3, ART BLOOD GAS (G3+)
TCO2: 29 mmol/L (ref 0–100)
pCO2 arterial: 45.8 mmHg — ABNORMAL HIGH (ref 35.0–45.0)
pH, Arterial: 7.383 (ref 7.350–7.450)
pO2, Arterial: 65 mmHg — ABNORMAL LOW (ref 80.0–100.0)

## 2012-03-05 LAB — BASIC METABOLIC PANEL
BUN: 20 mg/dL (ref 6–23)
Calcium: 8.3 mg/dL — ABNORMAL LOW (ref 8.4–10.5)
Creatinine, Ser: 0.95 mg/dL (ref 0.50–1.35)
GFR calc Af Amer: >90 mL/min (ref >90)
GFR calc non Af Amer: 85 mL/min — ABNORMAL LOW (ref >90)
Potassium: 4.2 mEq/L (ref 3.5–5.1)

## 2012-03-05 LAB — BLOOD GAS, ARTERIAL
Bicarbonate: 27.3 mEq/L — ABNORMAL HIGH (ref 20.0–24.0)
TCO2: 28.6 mmol/L (ref 0–100)
pCO2 arterial: 40.7 mmHg (ref 35.0–45.0)
pH, Arterial: 7.445 (ref 7.350–7.450)
pO2, Arterial: 66.9 mmHg — ABNORMAL LOW (ref 80.0–100.0)

## 2012-03-05 LAB — CARDIAC PANEL(CRET KIN+CKTOT+MB+TROPI)
CK, MB: 3.4 ng/mL (ref 0.3–4.0)
Relative Index: 1.5 (ref 0.0–2.5)
Troponin I: <0.3 ng/mL (ref <0.30)

## 2012-03-05 LAB — GLUCOSE, CAPILLARY
Glucose-Capillary: 108 mg/dL — ABNORMAL HIGH (ref 70–99)
Glucose-Capillary: 117 mg/dL — ABNORMAL HIGH (ref 70–99)
Glucose-Capillary: 118 mg/dL — ABNORMAL HIGH (ref 70–99)

## 2012-03-05 LAB — CBC
HCT: 35.2 % — ABNORMAL LOW (ref 39.0–52.0)
MCHC: 33 g/dL (ref 30.0–36.0)
RDW: 15.5 % (ref 11.5–15.5)

## 2012-03-05 MED ORDER — FENTANYL CITRATE 0.05 MG/ML IJ SOLN
25.0000 ug | INTRAMUSCULAR | Status: DC | PRN
  Administered 2012-03-05 (×2): 50 ug via INTRAVENOUS
  Administered 2012-03-05: 100 ug via INTRAVENOUS
  Administered 2012-03-05: 50 ug via INTRAVENOUS
  Administered 2012-03-06 – 2012-03-08 (×6): 100 ug via INTRAVENOUS
  Filled 2012-03-05 (×9): qty 2

## 2012-03-05 MED ORDER — THIAMINE HCL 100 MG/ML IJ SOLN
INTRAVENOUS | Status: AC
  Administered 2012-03-05: 18:00:00 via INTRAVENOUS
  Filled 2012-03-05: qty 2000

## 2012-03-05 NOTE — Progress Notes (Signed)
CRITICAL VALUE ALERT  Critical value received:  Blood cultures in one aerobic bottle, positive for gram negative rods  Date of notification:  03/05/2012  Time of notification:  2120  Critical value read back: yes  Nurse who received alert:  Berdine Dance  MD notified (1st page): [email or web address]  Time of first page:  2154  MD notified (2nd page):  Time of second page:  Responding MD:  [email or web address]  Time MD responded:  2154

## 2012-03-05 NOTE — Progress Notes (Signed)
Name: Michael Ellison MRN: [id number] DOB: [birth year]    LOS: 10  PULMONARY / CRITICAL CARE MEDICINE  Pt Profile:   66 year old male w/ pmh of ETOH,  s/p expl lap for SBO due to adhesions on 5/10 at [facility]. Post-op course c/b delirium, persistent ileus distention and episodic desaturation due to suspected aspiration. Transferred to [facility] service for treatment of acute respiratory failure, concern for possible post-op aspiration and delirium. Intubated shortly after arrival.   Interval hx:  5/16: OR for fascial dehiscence repair- closed with wound vac 5/18- failed weaning  Vital Signs: Temp:  [98.3 F (36.8 C)-100.9 F (38.3 C)] 99.2 F (37.3 C) (05/19 0805) Pulse Rate:  [96-115] 102  (05/19 0800) Resp:  [19-36] 28  (05/19 0800) BP: (101-182)/(71-102) 163/93 mmHg (05/19 0800) SpO2:  [92 %-100 %] 97 % (05/19 0800) FiO2 (%):  [40 %] 40 % (05/19 0733)  Physical Examination: General: elderly man intubated  Neuro:  No focal deficits, GCS 10T, follows all comands HEENT:  ETT Neck:  Supple, no JVD   Cardiovascular: Tachy, reg, no M/R/G Lungs: coarse throughout    Abdomen:  Wound vac with retention, No BS, firm, distended,  tender diffusely, ABD in place  Musculoskeletal:  Moves all extremities, no pedal edema Skin:  No rash  Principal Problem:  *Respiratory failure following trauma and surgery Active Problems:  Aspiration pneumonia  Alcohol abuse  Ileus following gastrointestinal surgery  BP (high blood pressure)  Sinus tachycardia  Delirium, drug-induced  Agitation   ASSESSMENT AND PLAN  PULMONARY  Lab 03/05/12 0354 03/01/12 0440 02/29/12 1852 02/29/12 1756 02/29/12 1720 02/29/12 0500  PHART 7.445 7.389 -- 7.343* -- 7.461*  PCO2ART 40.7 46.0* -- 42.3 -- 37.4  PO2ART 66.9* 63.0* -- 87.0 -- 58.8*  HCO3 27.3* 27.7* -- 22.9 -- 26.3*  O2SAT 92.5 91.0 73.2 96.0 71.9 --   Ventilator Settings: Vent Mode:  [-] CPAP FiO2 (%):  [40 %] 40 % Set Rate:  [15 bmp] 15  bmp PEEP:  [5 cmH20] 5 cmH20 Pressure Support:  [12 cmH20] 12 cmH20 Plateau Pressure:  [13 cmH20-20 cmH20] 13 cmH20 CXR:  5/19 Continued right lower lobe consolidation  ETT:  5/14>>>5/19  A:  Acute respiratory failure after aspiration of bilious emesis P:   -Continue vent support- failed 5/5 trial today on cpap 5ps 5, we slowly reduced PS to this goal 5 -daily WUA/PST -Continue ABX -Fentanyl drip, and PRN Versed, limited -upright position -abdo distention minimal -less secretions for sure today   CARDIOVASCULAR  Lab 03/05/12 0320 03/04/12 1737 03/02/12 0500 02/29/12 1630  TROPONINI <0.30 <0.30 -- <0.30  LATICACIDVEN -- -- -- 1.3  PROBNP -- -- 3782.0* --   ECG:   Lines:  left [place] CVL 5/14>>>  A: Septic shock/hypovolemic shock - resolved,  P:  -monitor tele Would like to consider neg balance soon  A:Hypertension, intermittent tachycardia  P: -PRN hydralazine and metoprolol ordered -continue Catapres patch  RENAL  Lab 03/05/12 0330 03/04/12 1800 03/04/12 0511 03/03/12 0510 03/02/12 0500 03/01/12 1223  NA 147* 152* 153* 150* 152* --  K 4.2 3.9 -- -- -- --  CL 112 115* 116* 110 111 --  CO2 28 30 32 32 33* --  BUN 20 20 25* 26* 20 --  CREATININE 0.95 0.94 1.02 1.02 0.79 --  CALCIUM 8.3* 8.4 8.2* 8.1* 8.3* --  MG -- -- -- 2.2 1.9 2.3  PHOS -- -- -- 2.8 1.6* 2.3   Intake/Output  05/18 0701 - 05/19 0700 05/19 0701 - 05/20 0700   I.V. (mL/kg) 980 (12.1)    NG/GT 20    IV Piggyback 350    TPN 1281    Total Intake(mL/kg) 2631 (32.5)    Urine (mL/kg/hr) 2120 (1.1) 200   Emesis/NG output 1250    Drains     Total Output 3370 200   Net -739 -200         Foley:  5/13  A:  Hypokalmemia  P:   -monitor electrolytes  A: hypernatremia -free water deficit 4.5 P: - continue D5W  But reduce this with TNP  GASTROINTESTINAL  Lab 03/02/12 0500 02/29/12 1630  AST 25 32  ALT 24 27  ALKPHOS 78 45  BILITOT 0.6 0.7  PROT 6.1 5.5*  ALBUMIN 2.1* 1.9*   ABD Wound  Vac 5/16>>>  CTAP 5/14: Residual dilatation of stomach and proximal small bowel with decompressed distal small bowel and colon. Changes could represent ileus or residual obstruction. Dilatation extends distal to the area of the anastomoses. Nonspecific wall thickening of jejunal loops may represent edema. No free air or free fluid to suggest perforation.   A:  Post op ileus s/p exploratory lap on 5/10 P:   - Cont gastric decompression with NGT - Continue TPN, keep NPO  A: Fascial dehiscence - closure of fascia, wound vac 5/16 P: -Per CCS -wound looks clean overall  HEMATOLOGIC  Lab 03/05/12 0330 03/03/12 0510 03/02/12 0500 03/01/12 0500 02/29/12 1630  HGB 11.6* 13.4 14.1 12.0* 12.4*  HCT 35.2* 38.8* 41.5 35.4* 36.0*  PLT 215 197 191 142* 122*  INR -- -- -- -- 1.16  APTT -- -- -- -- 21*   A:  Mild Anemia - no evidence of acute blood loss. No indication for PRBCs P:  -Monitor CBC -lovenox, follow crt trend in am further  INFECTIOUS  Lab 03/05/12 0330 03/03/12 0510 03/02/12 0500 03/01/12 0500 02/29/12 1630  WBC 25.8* 23.8* 20.4* 12.5* 13.4*  PROCALCITON -- -- 18.16 -- 52.60   Cultures: BCX2 5/14 >> UC 5/14 >>Enterococcus  Sputum 5/14 >> GNRs >> Klebsiella pneumoniae Wound 5/14 >> GNRs >>Klebsiella Pneumoniae & Enterobacter Cloacae  UC 5/18>>>  Antibiotics: nosocomial asp vanc 5/14 >> 5/17 Zosyn 5/13 >>   A:  Aspiration PNA  P:   -continue zosyn for 24 hrs further, also covers entroccus -unlikely I will narrow this agent  ENDOCRINE  Lab 03/05/12 0639 03/04/12 2343 03/04/12 1148 03/04/12 0516 03/04/12 0025  GLUCAP 117* 108* 108* 114* 120*   A:  No h/o DM P:   -check CBG q 4- well controled  NEUROLOGIC  A:  Acute encephalopathy - markedly improved. Appears to have had adverse reaction to lorazepam but has tolerated midazolam.  Post-operative pain P:   - PRN Versed and fentanyl, reduce fentnayl - Cont thiamine and folate with TPN   BEST PRACTICE /  DISPOSITION - Level of Care:  icu - Primary Service:  pccm - Consultants:  CCS - Code Status:  full - Diet:  TPN - DVT Px:  lmwh - GI Px:  ppi - Social / Family: updated multiple times this am   Ccm 30 min   Mcarthur Rossetti. [name], MD, FACP Pgr: [phone] [facility]

## 2012-03-05 NOTE — Progress Notes (Signed)
PARENTERAL NUTRITION CONSULT NOTE - FOLLOW UP   Pharmacy Consult for TPN Indication: Prolonged ileus s/p SBR for SBO 5/10  Allergies  Allergen Reactions  . Lorazepam Other (See Comments)    Severe agitated delirium. Tolerates midazolam and other benzo's    Patient Measurements: Height: 6\' 2"  (188 cm) Weight: 178 lb 9.2 oz (81 kg) IBW/kg (Calculated) : 82.2  Usual Weight: ~82 kg  Vital Signs: Temp: 99.7 F (37.6 C) (05/19 0400) Temp src: Oral (05/19 0400) BP: 158/94 mmHg (05/19 0630) Pulse Rate: 104  (05/19 0630) Intake/Output from previous day: 05/18 0701 - 05/19 0700 In: 2631 [I.V.:980; NG/GT:20; IV Piggyback:350; TPN:1281] Out: 3370 [Urine:2120; Emesis/NG output:1250] Intake/Output from this shift:    Labs:  Basename 03/05/12 0330 03/03/12 0510  WBC 25.8* 23.8*  HGB 11.6* 13.4  HCT 35.2* 38.8*  PLT 215 197  APTT -- --  INR -- --     Basename 03/05/12 0330 03/04/12 1800 03/04/12 0511 03/03/12 0510  NA 147* 152* 153* --  K 4.2 3.9 3.6 --  CL 112 115* 116* --  CO2 28 30 32 --  GLUCOSE 193* 112* 122* --  BUN 20 20 25* --  CREATININE 0.95 0.94 1.02 --  LABCREA -- -- -- --  CREAT24HRUR -- -- -- --  CALCIUM 8.3* 8.4 8.2* --  MG -- -- -- 2.2  PHOS -- -- -- 2.8  PROT -- -- -- --  ALBUMIN -- -- -- --  AST -- -- -- --  ALT -- -- -- --  ALKPHOS -- -- -- --  BILITOT -- -- -- --  BILIDIR -- -- -- --  IBILI -- -- -- --  PREALBUMIN -- -- -- --  TRIG 80 -- -- --  CHOLHDL -- -- -- --  CHOL -- -- -- --   Estimated Creatinine Clearance: 88.8 ml/min (by C-G formula based on Cr of 0.95).    Basename 03/05/12 0639 03/04/12 2343 03/04/12 1148  GLUCAP 117* 108* 108*    Insulin Requirements in the past 24 hours:  No insulin  Nutritional Goals:  1985 kCal, 105-120 grams of protein per day, 2-2.3L  Current Nutrition:  Clinimix E 5/20 at 83 ml/hr + lipids 54ml/hr M/W/F providing 1960 kcal; 100 gm protein  Assessment: 66 y.o. male on TPN for prolonged ileus  s/p SBR for SBO on 5/10 at [facility]. Noted PMH of alcohol abuse.  Pt NPO x 6 days and mild refeeding observed post TPN initiated 03/01/12. Refeeding better past replacement of lytes. Baseline prealbumin <3.  GI: SBO s/p resection 5/10 at [facility], ileus on bowel rest. NPO x 6 days. NGT remains high - 1250/24h. S/p OR 5/16 for debridement/abd closure. Endo: No h/o DM. CBGs WNL on SSI; a.m. glucose= 193; POCT CBGs 108,117 around same time - ?accuracy of BMET glucose. Consider d/c SSI/CBGs tomorrow if still at goal. Lytes:  Na+ remains up but improving with D5W at 40ml/hr. K better (goal ~4 for ileus). Others wnl. Renal: SCr stable, UOP 1.1 ml/kg/hr Pulm: reintubated post OR 5/16 - failed PS trial - albuterol nebs Cards:  hx HTN. BP quite variable. HR 96-115 - clonidine patch, lopressor and hydralazine PRN Hepatobil: LFTs WNL Neuro: acute encephalopathy (ativan allergy); fent gtt ID: Zosyn D#7 for Kleb PNA, presumed aspiration/Kleb and Enterobacter in wound/20 kcol enterococcus in urine. Tm 100.9, WBC trending up Best Practices:  Lovenox, PPI IV, MC  Plan:  - Continue Clinimix E 5/20 at goal 35ml/hr  - Multivitamins, trace elements,  and lipids on M/W/F due to national shortage. - Thiamine and folic acid daily in TPN - F/U AM labs  Christoper Fabian, PharmD, BCPS Clinical pharmacist, pager [phone] 03/05/2012, 7:24 AM

## 2012-03-05 NOTE — Procedures (Signed)
Extubation Procedure Note  Patient Details:   Name: Michael Ellison DOB: Feb 17, 1946 MRN: [id number]   Airway Documentation:     Evaluation  O2 sats: stable throughout Complications: No apparent complications Patient did tolerate procedure well. Bilateral Breath Sounds: Rhonchi Suctioning: Airway Yes  Harley Hallmark 03/05/2012, 10:39 AM

## 2012-03-05 NOTE — Progress Notes (Signed)
3 Days Post-Op  Subjective: Alert on vent. No complaints  Objective: Vital signs in last 24 hours: Temp:  [98.1 F (36.7 C)-100.9 F (38.3 C)] 99.7 F (37.6 C) (05/19 0400) Pulse Rate:  [96-115] 100  (05/19 0732) Resp:  [19-36] 28  (05/19 0732) BP: (101-182)/(71-102) 141/86 mmHg (05/19 0700) SpO2:  [92 %-100 %] 98 % (05/19 0720) FiO2 (%):  [40 %] 40 % (05/19 0733) Last BM Date: 02/28/12  Intake/Output from previous day: 05/18 0701 - 05/19 0700 In: 2631 [I.V.:980; NG/GT:20; IV Piggyback:350; TPN:1281] Out: 3370 [Urine:2120; Emesis/NG output:1250] Intake/Output this shift:    GI: soft, open wound clean. retention sutures intact  Lab Results:   Basename 03/05/12 0330 03/03/12 0510  WBC 25.8* 23.8*  HGB 11.6* 13.4  HCT 35.2* 38.8*  PLT 215 197   BMET  Basename 03/05/12 0330 03/04/12 1800  NA 147* 152*  K 4.2 3.9  CL 112 115*  CO2 28 30  GLUCOSE 193* 112*  BUN 20 20  CREATININE 0.95 0.94  CALCIUM 8.3* 8.4   PT/INR No results found for this basename: LABPROT:2,INR:2 in the last 72 hours ABG  Basename 03/05/12 0354  PHART 7.445  HCO3 27.3*    Studies/Results: Dg Chest Port 1 View  03/05/2012  *RADIOLOGY REPORT*  Clinical Data: Intubated, respiratory failure  PORTABLE CHEST - 1 VIEW  Comparison: 03/03/2012; 516/2013; 03/01/2012  Findings: Grossly unchanged cardiac silhouette and mediastinal contours.  Atherosclerotic calcifications within the aortic arch. Stable positioning of support apparatus.  The lungs remain hyperinflated.  No definite pneumothorax.  Grossly unchanged small bilateral effusions and bibasilar opacities, right greater than left.  Mild pulmonary venous congestion without frank evidence of pulmonary edema.  Grossly unchanged bones.  IMPRESSION: 1.  Stable positioning of support apparatus.  No pneumothorax. 2.  Grossly unchanged small bilateral effusions and bibasilar opacities, atelectasis versus infiltrate. 3.  Emphysema.  Original Report  Authenticated By: Waynard Reeds, M.D.    Anti-infectives: Anti-infectives     Start     Dose/Rate Route Frequency Ordered Stop   03/03/12 1800   vancomycin (VANCOCIN) 1,750 mg in sodium chloride 0.9 % 500 mL IVPB  Status:  Discontinued        1,750 mg 250 mL/hr over 120 Minutes Intravenous Every 12 hours 03/03/12 0628 03/03/12 0918   02/29/12 1900   micafungin (MYCAMINE) 100 mg in sodium chloride 0.9 % 100 mL IVPB  Status:  Discontinued        100 mg 100 mL/hr over 1 Hours Intravenous Every 24 hours 02/29/12 1754 03/01/12 0933   02/29/12 1730   vancomycin (VANCOCIN) 1,250 mg in sodium chloride 0.9 % 250 mL IVPB  Status:  Discontinued        1,250 mg 166.7 mL/hr over 90 Minutes Intravenous Every 12 hours 02/29/12 1644 03/03/12 0628   02/29/12 1715   vancomycin (VANCOCIN) IVPB 1000 mg/200 mL premix  Status:  Discontinued        1,000 mg 200 mL/hr over 60 Minutes Intravenous  Once 02/29/12 1637 02/29/12 1647   02/29/12 1645   piperacillin-tazobactam (ZOSYN) IVPB 3.375 g  Status:  Discontinued        3.375 g 100 mL/hr over 30 Minutes Intravenous  Once 02/29/12 1637 02/29/12 1641   02/28/12 1800   piperacillin-tazobactam (ZOSYN) IVPB 3.375 g  Status:  Discontinued        3.375 g 12.5 mL/hr over 240 Minutes Intravenous 4 times per day 02/28/12 1454 02/28/12 1510   02/28/12 1800  piperacillin-tazobactam (ZOSYN) IVPB 3.375 g        3.375 g 12.5 mL/hr over 240 Minutes Intravenous Every 8 hours 02/28/12 1510     02/25/12 1109   dextrose 5 % with cefOXitin (MEFOXIN) ADS Med     Comments: HOLLIE, CHRISTINA: cabinet override         02/25/12 1109 02/25/12 2314   02/25/12 1100   cefOXitin (MEFOXIN) 1 g in dextrose 5 % 50 mL IVPB  Status:  Discontinued        1 g 100 mL/hr over 30 Minutes Intravenous 60 min pre-op 02/25/12 1100 02/25/12 1542          Assessment/Plan: s/p Procedure(s) (LRB): DEBRIDEMENT CLOSURE/ABDOMINAL WOUND (N/A) continue bowel rest and tpn Continue abx   LOS: 10 days    TOTH III,Karess Harner S 03/05/2012

## 2012-03-06 ENCOUNTER — Inpatient Hospital Stay (HOSPITAL_COMMUNITY): Payer: Medicare Other

## 2012-03-06 LAB — COMPREHENSIVE METABOLIC PANEL
ALT: 18 U/L (ref 0–53)
AST: 27 U/L (ref 0–37)
Albumin: 1.6 g/dL — ABNORMAL LOW (ref 3.5–5.2)
Alkaline Phosphatase: 49 U/L (ref 39–117)
BUN: 20 mg/dL (ref 6–23)
Chloride: 112 mEq/L (ref 96–112)
Potassium: 3.3 mEq/L — ABNORMAL LOW (ref 3.5–5.1)
Sodium: 147 mEq/L — ABNORMAL HIGH (ref 135–145)
Total Bilirubin: 2.2 mg/dL — ABNORMAL HIGH (ref 0.3–1.2)
Total Protein: 6.1 g/dL (ref 6.0–8.3)

## 2012-03-06 LAB — CBC
HCT: 34.8 % — ABNORMAL LOW (ref 39.0–52.0)
Hemoglobin: 11.1 g/dL — ABNORMAL LOW (ref 13.0–17.0)
MCH: 29.7 pg (ref 26.0–34.0)
MCHC: 31.9 g/dL (ref 30.0–36.0)
MCV: 93 fL (ref 78.0–100.0)
RBC: 3.74 MIL/uL — ABNORMAL LOW (ref 4.22–5.81)

## 2012-03-06 LAB — GLUCOSE, CAPILLARY
Glucose-Capillary: 133 mg/dL — ABNORMAL HIGH (ref 70–99)
Glucose-Capillary: 132 mg/dL — ABNORMAL HIGH (ref 70–99)
Glucose-Capillary: 119 mg/dL — ABNORMAL HIGH (ref 70–99)

## 2012-03-06 LAB — DIFFERENTIAL
Basophils Relative: 0 % (ref 0–1)
Eosinophils Absolute: 0.2 10*3/uL (ref 0.0–0.7)
Lymphs Abs: 1.2 10*3/uL (ref 0.7–4.0)
Monocytes Absolute: 1.3 10*3/uL — ABNORMAL HIGH (ref 0.1–1.0)
Monocytes Relative: 6 % (ref 3–12)
Neutrophils Relative %: 86 % — ABNORMAL HIGH (ref 43–77)

## 2012-03-06 LAB — PREALBUMIN: Prealbumin: <3 mg/dL — ABNORMAL LOW (ref 17.0–34.0)

## 2012-03-06 LAB — TRIGLYCERIDES: Triglycerides: 87 mg/dL (ref <150)

## 2012-03-06 LAB — MAGNESIUM: Magnesium: 2 mg/dL (ref 1.5–2.5)

## 2012-03-06 MED ORDER — POTASSIUM CHLORIDE 10 MEQ/50ML IV SOLN
10.0000 meq | INTRAVENOUS | Status: AC
  Administered 2012-03-06 (×6): 10 meq via INTRAVENOUS
  Filled 2012-03-06: qty 300

## 2012-03-06 MED ORDER — FAT EMULSION 20 % IV EMUL
250.0000 mL | INTRAVENOUS | Status: AC
  Administered 2012-03-06: 250 mL via INTRAVENOUS
  Filled 2012-03-06: qty 250

## 2012-03-06 MED ORDER — TRACE MINERALS CR-CU-MN-SE-ZN 10-1000-500-60 MCG/ML IV SOLN
INTRAVENOUS | Status: AC
  Administered 2012-03-06: 18:00:00 via INTRAVENOUS
  Filled 2012-03-06: qty 2000

## 2012-03-06 MED ORDER — HALOPERIDOL LACTATE 5 MG/ML IJ SOLN
1.0000 mg | INTRAMUSCULAR | Status: DC | PRN
  Administered 2012-03-06: 2 mg via INTRAVENOUS
  Filled 2012-03-06: qty 1

## 2012-03-06 MED ORDER — FUROSEMIDE 10 MG/ML IJ SOLN
40.0000 mg | Freq: Two times a day (BID) | INTRAMUSCULAR | Status: AC
  Administered 2012-03-06 – 2012-03-07 (×2): 40 mg via INTRAVENOUS
  Filled 2012-03-06 (×2): qty 4

## 2012-03-06 NOTE — Progress Notes (Signed)
eLink Physician-Brief Progress Note Patient Name: Michael Ellison DOB: [birth year] MRN: [id number]  Date of Service  03/06/2012   HPI/Events of Note     eICU Interventions  Haldol prn agitation   Intervention Category Minor Interventions: Agitation / anxiety - evaluation and management  [name] V. 03/06/2012, 10:01 PM

## 2012-03-06 NOTE — Progress Notes (Signed)
OT Cancellation Note  Treatment cancelled today due to patient's refusal to participate: pt adamantly refusing to work with therapy at this time. Will check back as schedule allows. Thank you! Glendale Chard, OTR/L Pager: [phone] 03/06/2012    Yuridiana Formanek 03/06/2012, 9:49 AM

## 2012-03-06 NOTE — Progress Notes (Signed)
ANTIBIOTIC CONSULT NOTE - FOLLOW UP  Pharmacy Consult for Zosyn Indication: Klebsiella PNA and Klebsiella + Enterobacter wound infection  Allergies  Allergen Reactions  . Lorazepam Other (See Comments)    Severe agitated delirium. Tolerates midazolam and other benzo's    Patient Measurements: Height: 6\' 2"  (188 cm) Weight: 176 lb 2.4 oz (79.9 kg) IBW/kg (Calculated) : 82.2   Vital Signs: Temp: 97.7 F (36.5 C) (05/20 0800) Temp src: Oral (05/20 0800) BP: 140/84 mmHg (05/20 0900) Pulse Rate: 92  (05/20 0900) Intake/Output from previous day: 05/19 0701 - 05/20 0700 In: 3062 [I.V.:830; NG/GT:90; IV Piggyback:150; TPN:1992] Out: 2665 [Urine:1815; Emesis/NG output:700; Drains:150] Intake/Output from this shift: Total I/O In: 296 [I.V.:100; NG/GT:30; TPN:166] Out: 125 [Urine:125]  Labs:  Basename 03/06/12 0430 03/05/12 0330 03/04/12 1800  WBC 19.6* 25.8* --  HGB 11.1* 11.6* --  PLT 238 215 --  LABCREA -- -- --  CREATININE 0.88 0.95 0.94   Estimated Creatinine Clearance: 94.6 ml/min (by C-G formula based on Cr of 0.88). No results found for this basename: VANCOTROUGH:2,VANCOPEAK:2,VANCORANDOM:2,GENTTROUGH:2,GENTPEAK:2,GENTRANDOM:2,TOBRATROUGH:2,TOBRAPEAK:2,TOBRARND:2,AMIKACINPEAK:2,AMIKACINTROU:2,AMIKACIN:2, in the last 72 hours   Microbiology: Recent Results (from the past 720 hour(s))  SURGICAL PCR SCREEN     Status: Normal   Collection Time   02/25/12 10:31 AM      Component Value Range Status Comment   MRSA, PCR NEGATIVE  NEGATIVE  Final    Staphylococcus aureus NEGATIVE  NEGATIVE  Final   WOUND CULTURE     Status: Normal   Collection Time   02/29/12  9:22 AM      Component Value Range Status Comment   Specimen Description WOUND ABDOMEN INCISION SITE   Final    Special Requests NONE   Final    Gram Stain     Final    Value: FEW WBC PRESENT,BOTH PMN AND MONONUCLEAR     NO SQUAMOUS EPITHELIAL CELLS SEEN     RARE GRAM POSITIVE RODS     FEW GRAM NEGATIVE RODS   Culture     Final    Value: MODERATE KLEBSIELLA PNEUMONIAE     MODERATE ENTEROBACTER CLOACAE   Report Status 03/03/2012 FINAL   Final    Organism ID, Bacteria KLEBSIELLA PNEUMONIAE   Final    Organism ID, Bacteria ENTEROBACTER CLOACAE   Final   MRSA PCR SCREENING     Status: Normal   Collection Time   02/29/12  4:15 PM      Component Value Range Status Comment   MRSA by PCR NEGATIVE  NEGATIVE  Final   CULTURE, BLOOD (ROUTINE X 2)     Status: Normal (Preliminary result)   Collection Time   02/29/12  4:55 PM      Component Value Range Status Comment   Specimen Description BLOOD HAND LEFT   Final    Special Requests BOTTLES DRAWN AEROBIC ONLY 5CC   Final    Culture  Setup Time [id number]   Final    Culture     Final    Value: GRAM NEGATIVE RODS     Note: Gram Stain Report Called to,Read Back By and Verified With: Sharion Balloon RN on 03/05/12 at 21:22 by Christie Nottingham   Report Status PENDING   Incomplete   URINE CULTURE     Status: Normal   Collection Time   02/29/12  5:02 PM      Component Value Range Status Comment   Specimen Description URINE, CLEAN CATCH   Final    Special Requests NONE  Final    Culture  Setup Time [id number]   Final    Colony Count 20,OOO COLONIES/ML   Final    Culture ENTEROCOCCUS SPECIES   Final    Report Status 03/04/2012 FINAL   Final    Organism ID, Bacteria ENTEROCOCCUS SPECIES   Final   CULTURE, BLOOD (ROUTINE X 2)     Status: Normal (Preliminary result)   Collection Time   02/29/12  5:02 PM      Component Value Range Status Comment   Specimen Description BLOOD ARM RIGHT   Final    Special Requests BOTTLES DRAWN AEROBIC AND ANAEROBIC 10CC   Final    Culture  Setup Time [id number]   Final    Culture     Final    Value:        BLOOD CULTURE RECEIVED NO GROWTH TO DATE CULTURE WILL BE HELD FOR 5 DAYS BEFORE ISSUING A FINAL NEGATIVE REPORT   Report Status PENDING   Incomplete   CULTURE, RESPIRATORY     Status: Normal   Collection Time   02/29/12  7:22  PM      Component Value Range Status Comment   Specimen Description ENDOTRACHEAL   Final    Special Requests NONE   Final    Gram Stain     Final    Value: FEW WBC PRESENT,BOTH PMN AND MONONUCLEAR     NO SQUAMOUS EPITHELIAL CELLS SEEN     NO ORGANISMS SEEN   Culture FEW KLEBSIELLA PNEUMONIAE   Final    Report Status 03/03/2012 FINAL   Final    Organism ID, Bacteria KLEBSIELLA PNEUMONIAE   Final     Anti-infectives     Start     Dose/Rate Route Frequency Ordered Stop   03/03/12 1800   vancomycin (VANCOCIN) 1,750 mg in sodium chloride 0.9 % 500 mL IVPB  Status:  Discontinued        1,750 mg 250 mL/hr over 120 Minutes Intravenous Every 12 hours 03/03/12 0628 03/03/12 0918   02/29/12 1900   micafungin (MYCAMINE) 100 mg in sodium chloride 0.9 % 100 mL IVPB  Status:  Discontinued        100 mg 100 mL/hr over 1 Hours Intravenous Every 24 hours 02/29/12 1754 03/01/12 0933   02/29/12 1730   vancomycin (VANCOCIN) 1,250 mg in sodium chloride 0.9 % 250 mL IVPB  Status:  Discontinued        1,250 mg 166.7 mL/hr over 90 Minutes Intravenous Every 12 hours 02/29/12 1644 03/03/12 0628   02/29/12 1715   vancomycin (VANCOCIN) IVPB 1000 mg/200 mL premix  Status:  Discontinued        1,000 mg 200 mL/hr over 60 Minutes Intravenous  Once 02/29/12 1637 02/29/12 1647   02/29/12 1645   piperacillin-tazobactam (ZOSYN) IVPB 3.375 g  Status:  Discontinued        3.375 g 100 mL/hr over 30 Minutes Intravenous  Once 02/29/12 1637 02/29/12 1641   02/28/12 1800   piperacillin-tazobactam (ZOSYN) IVPB 3.375 g  Status:  Discontinued        3.375 g 12.5 mL/hr over 240 Minutes Intravenous 4 times per day 02/28/12 1454 02/28/12 1510   02/28/12 1800  piperacillin-tazobactam (ZOSYN) IVPB 3.375 g       3.375 g 12.5 mL/hr over 240 Minutes Intravenous Every 8 hours 02/28/12 1510     02/25/12 1109   dextrose 5 % with cefOXitin (MEFOXIN) ADS Med     Comments: Michael Ellison, CHRISTINA: cabinet  override         02/25/12 1109  02/25/12 2314   02/25/12 1100   cefOXitin (MEFOXIN) 1 g in dextrose 5 % 50 mL IVPB  Status:  Discontinued        1 g 100 mL/hr over 30 Minutes Intravenous 60 min pre-op 02/25/12 1100 02/25/12 1542          Assessment: Assessment: 33 YOM with h/o ETOH abuse who was tx from [facility] for respiratory failure s/p exlap for SBO on 5/10.   Infectious Disease: Zosyn D#8 for Kleb PNA, Kleb and Enterobacter in wound, 20 kcol enterococcus in urine. Tm 100.9. WBC decreased at 19.6. PCT 18 (decreasing) on 5/16 >>not repeated. Going to repeat blood cultures today to check for clearance. Plan for 10d course.   Zosyn 5/13 >> Vanc 5/14 >>5/17 Mycamine 5/14 >> 5/15  5/14 abd incision site -mod Kleb Pneumo (pan sens), mod Enterobacter Clo. (pan sens except R ancef) 5/14 resp cx - few Kleb Pneumo (pan sens) 5/14 blood cx - 1/2 GNR 5/14 Urine: 20k Enterococcus (pan sens) 5/14 MRSA PCR: neg  Nephrology: Creat remains stable. Still with hypernatremia but improving on D5W, K 3.3-replacing this am (Goal ~4 with ileus), UOP 0.9 ml/kg/hr.  Goal of Therapy:  Clinical Improvement  Plan:  Continue Zosyn 3.375g IV q8h- 4hr infusion.   Link Snuffer, PharmD, BCPS Clinical Pharmacist [phone] 03/06/2012,10:38 AM

## 2012-03-06 NOTE — Progress Notes (Signed)
PT Cancellation Note  Treatment cancelled today due to patient's refusal to participate.  Patient adamantly refusing and nursing even tried to get patient to work with PT without success.  Will return at a later date.    Ellison,Michael Spilde 03/06/2012, 10:12 AM  [facility] [phone] (pager)

## 2012-03-06 NOTE — Progress Notes (Signed)
Name: Michael Ellison MRN: [id number] DOB: November 05, 1945    LOS: 11  PULMONARY / CRITICAL CARE MEDICINE  Pt Profile:   66 year old male w/ pmh of ETOH,  s/p expl lap for SBO due to adhesions on 5/10 at [facility]. Post-op course c/b delirium, persistent ileus distention and episodic desaturation due to suspected aspiration. Transferred to [facility]/P[facility]M service for treatment of acute respiratory failure, concern for possible post-op aspiration and delirium. Intubated shortly after arrival.   Interval hx:  5/16: OR for fascial dehiscence repair- closed with wound vac 5/18- failed weaning 5/19- extubated  Vital Signs: Temp:  [97.5 F (36.4 C)-98 F (36.7 C)] 97.7 F (36.5 C) (05/20 1206) Pulse Rate:  [91-118] 91  (05/20 1000) Resp:  [28-38] 33  (05/20 1000) BP: (114-148)/(54-92) 135/80 mmHg (05/20 1000) SpO2:  [92 %-99 %] 95 % (05/20 1000) FiO2 (%):  [5 %-50 %] 50 % (05/20 1200) Weight:  [79.9 kg (176 lb 2.4 oz)] 79.9 kg (176 lb 2.4 oz) (05/20 0500)  Physical Examination: General: elderly man awake, no distress Neuro:  Awake, follows commands wells HEENT:  ETT Neck:  Supple, no JVD   Cardiovascular: , reg, no M/R/G Lungs: coarse throughout  improved Abdomen:  Wound vac with retention,clean, low BS Musculoskeletal:  Moves all extremities, no pedal edema Skin:  No rash  Principal Problem:  *Respiratory failure following trauma and surgery Active Problems:  Aspiration pneumonia  Alcohol abuse  Ileus following gastrointestinal surgery  BP (high blood pressure)  Sinus tachycardia  Delirium, drug-induced  Agitation   ASSESSMENT AND PLAN  PULMONARY  Lab 03/05/12 1817 03/05/12 0354 03/01/12 0440 02/29/12 1852 02/29/12 1756 02/29/12 0500  PHART 7.383 7.445 7.389 -- 7.343* 7.461*  PCO2ART 45.8* 40.7 46.0* -- 42.3 37.4  PO2ART 65.0* 66.9* 63.0* -- 87.0 58.8*  HCO3 27.3* 27.3* 27.7* -- 22.9 26.3*  O2SAT 92.0 92.5 91.0 73.2 96.0 --   Ventilator Settings: Vent Mode:  [-]  FiO2 (%):   [5 %-50 %] 50 % CXR:  5/19 Continued right lower lobe consolidation  ETT:  5/14>>>5/19  A:  Acute respiratory failure after aspiration of bilious emesis P:   -Extubated -IS -pcxr this am , assess atx Neg balance likely goal   CARDIOVASCULAR  Lab 03/05/12 1050 03/05/12 0320 03/04/12 1737 03/02/12 0500 02/29/12 1630  TROPONINI <0.30 <0.30 <0.30 -- <0.30  LATICACIDVEN -- -- -- -- 1.3  PROBNP -- -- -- 3782.0* --   ECG:   Lines:  left Heppner CVL 5/14>>>  A: Septic shock/hypovolemic shock - resolved,  P:  -monitor tele -lasix with d5 at 40 cc/hr ( k supp)  A:Hypertension, intermittent tachycardia  P: -PRN hydralazine and metoprolol ordered -continue Catapres patch  RENAL  Lab 03/06/12 0430 03/05/12 0330 03/04/12 1800 03/04/12 0511 03/03/12 0510 03/02/12 0500 03/01/12 1223  NA 147* 147* 152* 153* 150* -- --  K 3.3* 4.2 -- -- -- -- --  CL 112 112 115* 116* 110 -- --  CO2 27 28 30  32 32 -- --  BUN 20 20 20  25* 26* -- --  CREATININE 0.88 0.95 0.94 1.02 1.02 -- --  CALCIUM 8.3* 8.3* 8.4 8.2* 8.1* -- --  MG 2.0 -- -- -- 2.2 1.9 2.3  PHOS 3.2 -- -- -- 2.8 1.6* 2.3   Intake/Output      05/19 0701 - 05/20 0700 05/20 0701 - 05/21 0700   I.V. (mL/kg) 830 (10.4) 120 (1.5)   NG/GT 90 30   IV Piggyback 150 162.5  TPN 1992 415   Total Intake(mL/kg) 3062 (38.3) 727.5 (9.1)   Urine (mL/kg/hr) 1815 (0.9) 600 (1)   Emesis/NG output 700 500   Drains 150    Total Output 2665 1100   Net +397 -372.5        Stool Occurrence 1 x 1 x    Foley:  5/13  A:  Hypokalmemia  P:   -IV K  A: hypernatremia -free water deficit 4.5 P: - continue D5W, Na from tpn  GASTROINTESTINAL  Lab 03/06/12 0430 03/02/12 0500 02/29/12 1630  AST 27 25 32  ALT 18 24 27   ALKPHOS 49 78 45  BILITOT 2.2* 0.6 0.7  PROT 6.1 6.1 5.5*  ALBUMIN 1.6* 2.1* 1.9*   ABD Wound Vac 5/16>>>  CTAP 5/14: Residual dilatation of stomach and proximal small bowel with decompressed distal small bowel and colon. Changes  could represent ileus or residual obstruction. Dilatation extends distal to the area of the anastomoses. Nonspecific wall thickening of jejunal loops may represent edema. No free air or free fluid to suggest perforation.   A:  Post op ileus s/p exploratory lap on 5/10 P:   - Cont gastric decompression with NGT - Continue TPN, keep NPO  A: Fascial dehiscence - closure of fascia, wound vac 5/16 P: -Per CCS -wound looks clean overall with vac  HEMATOLOGIC  Lab 03/06/12 0430 03/05/12 0330 03/03/12 0510 03/02/12 0500 03/01/12 0500 02/29/12 1630  HGB 11.1* 11.6* 13.4 14.1 12.0* --  HCT 34.8* 35.2* 38.8* 41.5 35.4* --  PLT 238 215 197 191 142* --  INR -- -- -- -- -- 1.16  APTT -- -- -- -- -- 21*   A:  Mild Anemia - no evidence of acute blood loss. No indication for PRBCs P:  -Monitor CBC -lovenox, tolerated   INFECTIOUS  Lab 03/06/12 0430 03/05/12 0330 03/03/12 0510 03/02/12 0500 03/01/12 0500 02/29/12 1630  WBC 19.6* 25.8* 23.8* 20.4* 12.5* --  PROCALCITON -- -- -- 18.16 -- 52.60   Cultures: BCX2 5/14 >>1/2 gram neg rod>>> UC 5/14 >>Enterococcus  Sputum 5/14 >> GNRs >> Klebsiella pneumoniae Wound 5/14 >> GNRs >>Klebsiella Pneumoniae & Enterobacter Cloacae  UC 5/18>>>  Antibiotics: nosocomial asp vanc 5/14 >> 5/17 Zosyn 5/13 >>   A:  Aspiration PNA  P:   -continue zosyn, follow BC gram neg rod to ID -dc line when able -Repeat BC x 2 today  ENDOCRINE  Lab 03/06/12 1205 03/06/12 0605 03/06/12 0009 03/05/12 1746 03/05/12 1132  GLUCAP 132* 133* 129* 122* 118*   A:  No h/o DM P:   -check CBG q 4- well controled  NEUROLOGIC  A:  Acute encephalopathy - markedly improved. Appears to have had adverse reaction to lorazepam but has tolerated midazolam.  Post-operative pain delerium P:   -dc all benzo -prn fentanyl - Cont thiamine and folate with TPN  If needed, would use low dose haldol Ativan reaction, avoid Keep in icu  BEST PRACTICE / DISPOSITION - Level of  Care:  icu - Primary Service:  pccm - Consultants:  CCS - Code Status:  full - Diet:  TPN - DVT Px:  lmwh - GI Px:  ppi - Social / Family: updated multiple times this am    Mcarthur Rossetti. [name], MD, FACP Pgr: [phone] [facility]

## 2012-03-06 NOTE — Progress Notes (Signed)
Utilization Review Completed.Osvaldo Lamping T5/20/2013   

## 2012-03-06 NOTE — Progress Notes (Signed)
PARENTERAL NUTRITION CONSULT NOTE - FOLLOW UP   Pharmacy Consult for TPN Indication: Prolonged ileus s/p SBR for SBO 5/10  Allergies  Allergen Reactions  . Lorazepam Other (See Comments)    Severe agitated delirium. Tolerates midazolam and other benzo's    Patient Measurements: Height: 6\' 2"  (188 cm) Weight: 176 lb 2.4 oz (79.9 kg) IBW/kg (Calculated) : 82.2  Usual Weight: ~82 kg  Vital Signs: Temp: 97.7 F (36.5 C) (05/20 0800) Temp src: Oral (05/20 0800) BP: 140/84 mmHg (05/20 0900) Pulse Rate: 92  (05/20 0900) Intake/Output from previous day: 05/19 0701 - 05/20 0700 In: 3032 [I.V.:800; NG/GT:90; IV Piggyback:150; TPN:1992] Out: 2665 [Urine:1815; Emesis/NG output:700; Drains:150] Intake/Output from this shift: Total I/O In: -  Out: 125 [Urine:125]  Labs:  [facility] 03/06/12 0430 03/05/12 0330  WBC 19.6* 25.8*  HGB 11.1* 11.6*  HCT 34.8* 35.2*  PLT 238 215  APTT -- --  INR -- --     Basename 03/06/12 0430 03/05/12 0330 03/04/12 1800  NA 147* 147* 152*  K 3.3* 4.2 3.9  CL 112 112 115*  CO2 27 28 30   GLUCOSE 115* 193* 112*  BUN 20 20 20   CREATININE 0.88 0.95 0.94  LABCREA -- -- --  CREAT24HRUR -- -- --  CALCIUM 8.3* 8.3* 8.4  MG 2.0 -- --  PHOS 3.2 -- --  PROT 6.1 -- --  ALBUMIN 1.6* -- --  AST 27 -- --  ALT 18 -- --  ALKPHOS 49 -- --  BILITOT 2.2* -- --  BILIDIR -- -- --  IBILI -- -- --  PREALBUMIN -- -- --  TRIG 87 80 --  CHOLHDL -- -- --  CHOL 68 -- --   Estimated Creatinine Clearance: 94.6 ml/min (by C-G formula based on Cr of 0.88).    Basename 03/06/12 0605 03/06/12 0009 03/05/12 1746  GLUCAP 133* 129* 122*    Insulin Requirements in the past 24 hours:  3 units  Nutritional Goals:  1985 kCal, 105-120 grams of protein per day, 2-2.3L  Current Nutrition:  Clinimix E 5/20 at 83 ml/hr + lipids 57ml/hr M/W/F providing 1960 kcal; 100 gm protein  Assessment: 66 y.o. male on TPN for prolonged ileus s/p SBR for SBO on 5/10 at [facility]. Noted PMH of alcohol abuse.  Pt NPO x 6 days and mild refeeding observed post TPN initiated 03/01/12. Refeeding better past replacement of lytes. Baseline prealbumin <3.  GI: SBO s/p resection 5/10 at [facility], ileus on bowel rest. NPO x 6 days. NGT remains high - 700/24h. S/p OR 5/16 for debridement/abd closure. Endo: No h/o DM. CBGs WNL on SSI. Lytes:  Na+ remains up- cont on with D5W at 21ml/hr. K low (goal ~4 for ileus). Others wnl. Renal: SCr stable, UOP 0.9 ml/kg/hr Pulm: Venturimask, FiO2 50% Cards:  hx HTN. BP quite variable. HR 90s - clonidine patch, lopressor and hydralazine PRN Hepatobil: AST/ALT ok, tbil up at 2.2. Trig WNL, prealbumin pending. Neuro: acute encephalopathy (ativan allergy); fent gtt ID: Zosyn D#[id number] for Kleb PNA, presumed aspiration/Kleb and Enterobacter in wound/20 kcol enterococcus in urine. Afebrile, WBC trending down. Best Practices:  Lovenox, PPI IV, MC  Plan:  - 6 runs KCL now - Continue Clinimix E 5/20 at goal 46ml/hr  - Multivitamins, trace elements, and lipids on M/W/F due to national shortage. - Thiamine and folic acid daily in TPN - F/U AM BMET, prealbumin  Zyrion Coey K. Allena Katz, PharmD, BCPS.  Clinical Pharmacist Pager [phone]. 03/06/2012 9:33 AM

## 2012-03-07 ENCOUNTER — Inpatient Hospital Stay (HOSPITAL_COMMUNITY): Payer: Medicare Other

## 2012-03-07 LAB — POCT I-STAT 3, ART BLOOD GAS (G3+)
Acid-Base Excess: 1 mmol/L (ref 0.0–2.0)
Bicarbonate: 30.3 mEq/L — ABNORMAL HIGH (ref 20.0–24.0)
Patient temperature: 96.3
TCO2: 37 mmol/L (ref 0–100)
TCO2: 32 mmol/L (ref 0–100)
pH, Arterial: 7.144 — CL (ref 7.350–7.450)
pH, Arterial: 7.267 — ABNORMAL LOW (ref 7.350–7.450)
pO2, Arterial: 249 mmHg — ABNORMAL HIGH (ref 80.0–100.0)

## 2012-03-07 LAB — CBC
Platelets: 315 10*3/uL (ref 150–400)
RBC: 4.1 MIL/uL — ABNORMAL LOW (ref 4.22–5.81)
WBC: 27.7 10*3/uL — ABNORMAL HIGH (ref 4.0–10.5)

## 2012-03-07 LAB — CULTURE, BLOOD (ROUTINE X 2)
Culture  Setup Time: 201305150141
Culture: NO GROWTH

## 2012-03-07 LAB — PROCALCITONIN: Procalcitonin: 3.7 ng/mL

## 2012-03-07 LAB — BASIC METABOLIC PANEL
Calcium: 8.6 mg/dL (ref 8.4–10.5)
GFR calc Af Amer: 85 mL/min — ABNORMAL LOW (ref >90)
GFR calc non Af Amer: 73 mL/min — ABNORMAL LOW (ref >90)
Potassium: 4.6 mEq/L (ref 3.5–5.1)
Sodium: 146 mEq/L — ABNORMAL HIGH (ref 135–145)

## 2012-03-07 LAB — GLUCOSE, CAPILLARY
Glucose-Capillary: 105 mg/dL — ABNORMAL HIGH (ref 70–99)
Glucose-Capillary: 122 mg/dL — ABNORMAL HIGH (ref 70–99)
Glucose-Capillary: 164 mg/dL — ABNORMAL HIGH (ref 70–99)
Glucose-Capillary: 212 mg/dL — ABNORMAL HIGH (ref 70–99)

## 2012-03-07 MED ORDER — MIDAZOLAM HCL 2 MG/2ML IJ SOLN
INTRAMUSCULAR | Status: AC
  Filled 2012-03-07: qty 4

## 2012-03-07 MED ORDER — FENTANYL CITRATE 0.05 MG/ML IJ SOLN: 200.0000 ug | Freq: Once | INTRAMUSCULAR | Status: DC

## 2012-03-07 MED ORDER — INSULIN ASPART 100 UNIT/ML ~~LOC~~ SOLN
0.0000 [IU] | SUBCUTANEOUS | Status: DC
  Administered 2012-03-07 – 2012-03-12 (×18): 2 [IU] via SUBCUTANEOUS

## 2012-03-07 MED ORDER — ALBUTEROL SULFATE HFA 108 (90 BASE) MCG/ACT IN AERS
6.0000 | INHALATION_SPRAY | Freq: Four times a day (QID) | RESPIRATORY_TRACT | Status: DC
  Administered 2012-03-07 – 2012-03-17 (×37): 6 via RESPIRATORY_TRACT
  Filled 2012-03-07 (×3): qty 6.7

## 2012-03-07 MED ORDER — VECURONIUM BROMIDE 10 MG IV SOLR
10.0000 mg | Freq: Once | INTRAVENOUS | Status: AC
  Administered 2012-03-08: 7 mg via INTRAVENOUS
  Filled 2012-03-07: qty 10

## 2012-03-07 MED ORDER — ETOMIDATE 2 MG/ML IV SOLN
40.0000 mg | Freq: Once | INTRAVENOUS | Status: AC
  Administered 2012-03-08: 20 mg via INTRAVENOUS
  Filled 2012-03-07: qty 20

## 2012-03-07 MED ORDER — FENTANYL CITRATE 0.05 MG/ML IJ SOLN
25.0000 ug/h | INTRAMUSCULAR | Status: DC
  Administered 2012-03-07: 50 ug/h via INTRAVENOUS
  Administered 2012-03-07: 200 ug/h via INTRAVENOUS
  Administered 2012-03-07 (×2): 50 ug/h via INTRAVENOUS
  Administered 2012-03-08: 200 ug/h via INTRAVENOUS
  Administered 2012-03-08 – 2012-03-09 (×2): 175 ug/h via INTRAVENOUS
  Filled 2012-03-07 (×5): qty 50

## 2012-03-07 MED ORDER — ETOMIDATE 2 MG/ML IV SOLN
20.0000 mg | Freq: Once | INTRAVENOUS | Status: AC
Start: 2012-03-07 — End: 2012-03-07
  Administered 2012-03-07: 20 mg via INTRAVENOUS

## 2012-03-07 MED ORDER — FENTANYL CITRATE 0.05 MG/ML IJ SOLN
INTRAMUSCULAR | Status: AC
  Filled 2012-03-07: qty 4

## 2012-03-07 MED ORDER — PROPOFOL 10 MG/ML IV EMUL: 5.0000 ug/kg/min | Freq: Once | INTRAVENOUS | Status: DC

## 2012-03-07 MED ORDER — SODIUM CHLORIDE 0.9 % IV BOLUS (SEPSIS)
250.0000 mL | Freq: Once | INTRAVENOUS | Status: AC
  Administered 2012-03-07: 250 mL via INTRAVENOUS

## 2012-03-07 MED ORDER — THIAMINE HCL 100 MG/ML IJ SOLN
INTRAMUSCULAR | Status: DC
  Filled 2012-03-07: qty 2000

## 2012-03-07 MED ORDER — MIDAZOLAM HCL 2 MG/2ML IJ SOLN
5.0000 mg | Freq: Once | INTRAMUSCULAR | Status: AC
  Administered 2012-03-08: 4 mg via INTRAVENOUS
  Filled 2012-03-07: qty 6

## 2012-03-07 MED ORDER — CLINIMIX E/DEXTROSE (5/15) 5 % IV SOLN
INTRAVENOUS | Status: AC
  Administered 2012-03-07: 17:00:00 via INTRAVENOUS
  Filled 2012-03-07: qty 2400

## 2012-03-07 NOTE — Progress Notes (Signed)
OT Cancellation Note  _x__Treatment cancelled today due to medical issues with patient which prohibited therapy. Pt re-intubated and sedated this am. Will sign off for now. Please re-order when pt is medically appropriate.  ___ Treatment cancelled today due to patient receiving procedure or test   ___ Treatment cancelled today due to patient's refusal to participate   ___ Treatment cancelled today due to   Signature: Garrel Ridgel, OTR/L  Pager [phone] 03/07/2012

## 2012-03-07 NOTE — Progress Notes (Signed)
Nutrition Follow-up  Patient was extubated 5/19 and re-intubated this morning.  VAC in place to abdominal wound.  Diet Order:  NPO  Patient is receiving TPN with Clinimix E 5/15 increasing to  100 ml/hr today.  Lipids (20% IVFE @ 10 ml/hr), multivitamins, and trace elements are provided 3 times weekly (MWF) due to national backorder.  Provides 1910 kcal and 120 grams protein daily (based on weekly average).  Meets 100% minimum estimated kcal and 100% minimum estimated protein needs.   Meds: Scheduled Meds:   . albuterol  6 puff Inhalation Q6H  . antiseptic oral rinse  15 mL Mouth Rinse QID  . chlorhexidine  15 mL Mouth Rinse BID  . enoxaparin  40 mg Subcutaneous Q24H  . etomidate  20 mg Intravenous Once  . etomidate  40 mg Intravenous Once  . fentaNYL  200 mcg Intravenous Once  . furosemide  40 mg Intravenous Q12H  . insulin aspart  0-15 Units Subcutaneous Q4H  . midazolam  5 mg Intravenous Once  . pantoprazole (PROTONIX) IV  40 mg Intravenous QHS  . piperacillin-tazobactam (ZOSYN)  IV  3.375 g Intravenous Q8H  . potassium chloride  10 mEq Intravenous Q1 Hr x 6  . propofol  5-70 mcg/kg/min Intravenous Once  . sodium chloride  250 mL Intravenous Once  . sodium chloride  10-40 mL Intracatheter Q12H  . vecuronium  10 mg Intravenous Once  . DISCONTD: albuterol  2.5 mg Nebulization Q6H  . DISCONTD: cloNIDine  0.3 mg Transdermal Weekly  . DISCONTD: insulin aspart  0-9 Units Subcutaneous Q6H   Continuous Infusions:   . dextrose 1,000 mL (03/07/12 0908)  . TPN (CLINIMIX) +/- additives 83 mL/hr at 03/06/12 1736   And  . fat emulsion 250 mL (03/06/12 1737)  . fentaNYL infusion INTRAVENOUS 150 mcg/hr (03/07/12 1300)  . TPN (CLINIMIX) +/- additives    . TPN (CLINIMIX) +/- additives 83 mL/hr at 03/05/12 1734  . DISCONTD: TPN (CLINIMIX) +/- additives     PRN Meds:.acetaminophen, fentaNYL, haloperidol lactate, hydrALAZINE, metoprolol, sodium chloride, DISCONTD: albuterol  Labs:  CMP    Component Value Date/Time   NA 146* 03/07/2012 0437   K 4.6 03/07/2012 0437   CL 108 03/07/2012 0437   CO2 32 03/07/2012 0437   GLUCOSE 200* 03/07/2012 0437   BUN 24* 03/07/2012 0437   CREATININE 1.04 03/07/2012 0437   CALCIUM 8.6 03/07/2012 0437   PROT 6.1 03/06/2012 0430   ALBUMIN 1.6* 03/06/2012 0430   AST 27 03/06/2012 0430   ALT 18 03/06/2012 0430   ALKPHOS 49 03/06/2012 0430   BILITOT 2.2* 03/06/2012 0430   GFRNONAA 73* 03/07/2012 0437   GFRAA 85* 03/07/2012 0437   CBG (last 3)   Basename 03/07/12 1225 03/07/12 0809 03/07/12 0608  GLUCAP 125* 164* 157*     Intake/Output Summary (Last 24 hours) at 03/07/12 1614 Last data filed at 03/07/12 1500  Gross per 24 hour  Intake 3206.5 ml  Output   3530 ml  Net -323.5 ml    Weight Status:  79.9 kg (down slightly with negative fluid balance); BMI=22.6  Re-estimated needs:  1850-1950 kcals, 105-120 grams protein per day.  Nutrition Dx:  Inadequate oral intake, ongoing.  Goal:  Intake to meet 90-100% of estimated nutrition needs, met.  Intervention:    Continue TPN to meet 100% of estimated nutrition needs while bowel rest needed.  RD will assist with transition to enteral nutrition when able.  Monitor:  TPN tolerance/adequacy, labs, weight trend, ability  to transition to enteral nutrition.   Hettie Holstein Pager #:  [phone]

## 2012-03-07 NOTE — Progress Notes (Cosign Needed)
PT Cancellation Note  Treatment cancelled today due to medical issues with patient which prohibited therapy.  Will sign off due to patient was re-intubated this am and is now sedated.  Thanks.  INGOLD,Lenetta Piche 03/07/2012, 9:34 AM  [facility] [phone] (pager)

## 2012-03-07 NOTE — Progress Notes (Signed)
Pt had 10 beat run of V Tach; called Dr Vassie Loll in the [facility]; spoke with Maralyn Sago and she will relay to Dr Vassie Loll.

## 2012-03-07 NOTE — Progress Notes (Signed)
Patient ID: Michael Ellison, male   DOB: 02/11/1946, 66 y.o.   MRN: [id number] [facility] Surgery Progress Note:   5 Days Post-Op  Subjective: Mental status is obtunded from recent reintubation.   Objective: Vital signs in last 24 hours: Temp:  [96.3 F (35.7 C)-98.6 F (37 C)] 96.3 F (35.7 C) (05/21 0359) Pulse Rate:  [57-108] 99  (05/21 0740) Resp:  [24-41] 38  (05/21 0740) BP: (86-183)/(46-92) 110/65 mmHg (05/21 0740) SpO2:  [86 %-100 %] 95 % (05/21 0740) FiO2 (%):  [50 %-100 %] 100 % (05/21 0823)  Intake/Output from previous day: 05/20 0701 - 05/21 0700 In: 3205.5 [I.V.:770; NG/GT:30; IV Piggyback:366.5; TPN:2039] Out: 5330 [Urine:4260; Emesis/NG output:1000; Drains:70] Intake/Output this shift: Total I/O In: -  Out: 200 [Urine:200]  Physical Exam: Work of breathing is  On vent.  VAC drainage appropriate.  Retention sutures in place.  Pulmonary failure led to reintubation  Lab Results:  Results for orders placed during the hospital encounter of 02/24/12 (from the past 48 hour(s))  CARDIAC PANEL(CRET KIN+CKTOT+MB+TROPI)     Status: Normal   Collection Time   03/05/12 10:50 AM      Component Value Range Comment   Total CK 229  7 - 232 (U/L)    CK, MB 3.4  0.3 - 4.0 (ng/mL)    Troponin I <0.30  <0.30 (ng/mL)    Relative Index 1.5  0.0 - 2.5    GLUCOSE, CAPILLARY     Status: Abnormal   Collection Time   03/05/12 11:32 AM      Component Value Range Comment   Glucose-Capillary 118 (*) 70 - 99 (mg/dL)   GLUCOSE, CAPILLARY     Status: Abnormal   Collection Time   03/05/12  5:46 PM      Component Value Range Comment   Glucose-Capillary 122 (*) 70 - 99 (mg/dL)   POCT I-STAT 3, BLOOD GAS (G3+)     Status: Abnormal   Collection Time   03/05/12  6:17 PM      Component Value Range Comment   pH, Arterial 7.383  7.350 - 7.450     pCO2 arterial 45.8 (*) 35.0 - 45.0 (mmHg)    pO2, Arterial 65.0 (*) 80.0 - 100.0 (mmHg)    Bicarbonate 27.3 (*) 20.0 - 24.0 (mEq/L)    TCO2 29   0 - 100 (mmol/L)    O2 Saturation 92.0      Acid-Base Excess 2.0  0.0 - 2.0 (mmol/L)    Collection site RADIAL, ALLEN'S TEST ACCEPTABLE      Drawn by Operator      Sample type ARTERIAL     GLUCOSE, CAPILLARY     Status: Abnormal   Collection Time   03/06/12 12:09 AM      Component Value Range Comment   Glucose-Capillary 129 (*) 70 - 99 (mg/dL)   COMPREHENSIVE METABOLIC PANEL     Status: Abnormal   Collection Time   03/06/12  4:30 AM      Component Value Range Comment   Sodium 147 (*) 135 - 145 (mEq/L)    Potassium 3.3 (*) 3.5 - 5.1 (mEq/L)    Chloride 112  96 - 112 (mEq/L)    CO2 27  19 - 32 (mEq/L)    Glucose, Bld 115 (*) 70 - 99 (mg/dL)    BUN 20  6 - 23 (mg/dL)    Creatinine, Ser 4.09  0.50 - 1.35 (mg/dL)    Calcium 8.3 (*) 8.4 - 10.5 (  mg/dL)    Total Protein 6.1  6.0 - 8.3 (g/dL)    Albumin 1.6 (*) 3.5 - 5.2 (g/dL)    AST 27  0 - 37 (U/L)    ALT 18  0 - 53 (U/L)    Alkaline Phosphatase 49  39 - 117 (U/L)    Total Bilirubin 2.2 (*) 0.3 - 1.2 (mg/dL)    GFR calc non Af Amer 88 (*) >90 (mL/min)    GFR calc Af Amer >90  >90 (mL/min)   MAGNESIUM     Status: Normal   Collection Time   03/06/12  4:30 AM      Component Value Range Comment   Magnesium 2.0  1.5 - 2.5 (mg/dL)   PHOSPHORUS     Status: Normal   Collection Time   03/06/12  4:30 AM      Component Value Range Comment   Phosphorus 3.2  2.3 - 4.6 (mg/dL)   CBC     Status: Abnormal   Collection Time   03/06/12  4:30 AM      Component Value Range Comment   WBC 19.6 (*) 4.0 - 10.5 (K/uL)    RBC 3.74 (*) 4.22 - 5.81 (MIL/uL)    Hemoglobin 11.1 (*) 13.0 - 17.0 (g/dL)    HCT 16.1 (*) 09.6 - 52.0 (%)    MCV 93.0  78.0 - 100.0 (fL)    MCH 29.7  26.0 - 34.0 (pg)    MCHC 31.9  30.0 - 36.0 (g/dL)    RDW 04.5  40.9 - 81.1 (%)    Platelets 238  150 - 400 (K/uL)   DIFFERENTIAL     Status: Abnormal   Collection Time   03/06/12  4:30 AM      Component Value Range Comment   Neutrophils Relative 86 (*) 43 - 77 (%)    Neutro Abs  17.0 (*) 1.7 - 7.7 (K/uL)    Lymphocytes Relative 6 (*) 12 - 46 (%)    Lymphs Abs 1.2  0.7 - 4.0 (K/uL)    Monocytes Relative 6  3 - 12 (%)    Monocytes Absolute 1.3 (*) 0.1 - 1.0 (K/uL)    Eosinophils Relative 1  0 - 5 (%)    Eosinophils Absolute 0.2  0.0 - 0.7 (K/uL)    Basophils Relative 0  0 - 1 (%)    Basophils Absolute 0.1  0.0 - 0.1 (K/uL)   CHOLESTEROL, TOTAL     Status: Normal   Collection Time   03/06/12  4:30 AM      Component Value Range Comment   Cholesterol 68  0 - 200 (mg/dL)   TRIGLYCERIDES     Status: Normal   Collection Time   03/06/12  4:30 AM      Component Value Range Comment   Triglycerides 87  <150 (mg/dL)   PREALBUMIN     Status: Abnormal   Collection Time   03/06/12  4:30 AM      Component Value Range Comment   Prealbumin <3.0 (*) 17.0 - 34.0 (mg/dL)   GLUCOSE, CAPILLARY     Status: Abnormal   Collection Time   03/06/12  6:05 AM      Component Value Range Comment   Glucose-Capillary 133 (*) 70 - 99 (mg/dL)   GLUCOSE, CAPILLARY     Status: Abnormal   Collection Time   03/06/12 12:05 PM      Component Value Range Comment   Glucose-Capillary 132 (*) 70 - 99 (  mg/dL)   GLUCOSE, CAPILLARY     Status: Abnormal   Collection Time   03/06/12  6:22 PM      Component Value Range Comment   Glucose-Capillary 119 (*) 70 - 99 (mg/dL)   GLUCOSE, CAPILLARY     Status: Abnormal   Collection Time   03/07/12 12:10 AM      Component Value Range Comment   Glucose-Capillary 212 (*) 70 - 99 (mg/dL)    Comment 1 Documented in Chart      Comment 2 Notify RN     BASIC METABOLIC PANEL     Status: Abnormal   Collection Time   03/07/12  4:37 AM      Component Value Range Comment   Sodium 146 (*) 135 - 145 (mEq/L)    Potassium 4.6  3.5 - 5.1 (mEq/L) DELTA CHECK NOTED   Chloride 108  96 - 112 (mEq/L)    CO2 32  19 - 32 (mEq/L)    Glucose, Bld 200 (*) 70 - 99 (mg/dL)    BUN 24 (*) 6 - 23 (mg/dL)    Creatinine, Ser 1.61  0.50 - 1.35 (mg/dL)    Calcium 8.6  8.4 - 10.5 (mg/dL)      GFR calc non Af Amer 73 (*) >90 (mL/min)    GFR calc Af Amer 85 (*) >90 (mL/min)   CBC     Status: Abnormal   Collection Time   03/07/12  4:37 AM      Component Value Range Comment   WBC 27.7 (*) 4.0 - 10.5 (K/uL)    RBC 4.10 (*) 4.22 - 5.81 (MIL/uL)    Hemoglobin 12.4 (*) 13.0 - 17.0 (g/dL)    HCT 09.6  04.5 - 40.9 (%)    MCV 96.1  78.0 - 100.0 (fL)    MCH 30.2  26.0 - 34.0 (pg)    MCHC 31.5  30.0 - 36.0 (g/dL)    RDW 81.1 (*) 91.4 - 15.5 (%)    Platelets 315  150 - 400 (K/uL)   GLUCOSE, CAPILLARY     Status: Abnormal   Collection Time   03/07/12  6:08 AM      Component Value Range Comment   Glucose-Capillary 157 (*) 70 - 99 (mg/dL)    Comment 1 Documented in Chart      Comment 2 Notify RN     POCT I-STAT 3, BLOOD GAS (G3+)     Status: Abnormal   Collection Time   03/07/12  7:42 AM      Component Value Range Comment   pH, Arterial 7.144 (*) 7.350 - 7.450     pCO2 arterial 96.1 (*) 35.0 - 45.0 (mmHg)    pO2, Arterial 84.0  80.0 - 100.0 (mmHg)    Bicarbonate 33.6 (*) 20.0 - 24.0 (mEq/L)    TCO2 37  0 - 100 (mmol/L)    O2 Saturation 93.0      Acid-Base Excess 1.0  0.0 - 2.0 (mmol/L)    Patient temperature 96.3 F      Collection site RADIAL, ALLEN'S TEST ACCEPTABLE      Drawn by Operator      Sample type ARTERIAL      Comment NOTIFIED PHYSICIAN     GLUCOSE, CAPILLARY     Status: Abnormal   Collection Time   03/07/12  8:09 AM      Component Value Range Comment   Glucose-Capillary 164 (*) 70 - 99 (mg/dL)     Radiology/Results: Dg Chest  Port 1 View  03/07/2012  *RADIOLOGY REPORT*  Clinical Data: Endotracheal tube placement.  PORTABLE CHEST - 1 VIEW  Comparison: Earlier same date and 03/06/2012.  Findings: 0823 hours.  Interval intubation.  Tip of endotracheal tube is approximately 4.0 cm above the carina.  Left subclavian central line and nasogastric tube are stable in position.  There are stable bibasilar air space opacities and bilateral pleural effusions.  Apical bullous  changes are noted bilaterally.  There is no evidence of pneumothorax.  IMPRESSION:  1.  Endotracheal tube appears satisfactorily positioned. 2.  No change in the basilar air space opacities and pleural effusions.  Original Report Authenticated By: Gerrianne Scale, M.D.   Dg Chest Port 1 View  03/07/2012  *RADIOLOGY REPORT*  Clinical Data: Shortness of breath.  PORTABLE CHEST - 1 VIEW  Comparison: Radiographs 03/06/2012 and CT 02/29/2012.  Findings: 0426 hours.  Nasogastric tube and left subclavian central line are stable in position.  No endotracheal tube is visualized. The heart size and mediastinal contours are stable.  There are worsening bibasilar air space opacities and probable enlarging bilateral pleural effusions.  Extensive bullous changes at both lung apices are stable.  No pneumothorax is evident.  IMPRESSION:  1.  Worsening basilar air space opacities and probable enlarging bilateral pleural effusions. 2.  Stable bullous changes at both apices.  Original Report Authenticated By: Gerrianne Scale, M.D.   Dg Chest Port 1 View  03/06/2012  *RADIOLOGY REPORT*  Clinical Data: Shortness of breath.  PORTABLE CHEST - 1 VIEW  Comparison: 03/05/2012  Findings: The patient has a nasogastric tube, tip off the radiographic field.  Left-sided subclavian central line tip overlies the level of the superior vena cava. Endotracheal tube is not as well seen on the current study.  Correlation is recommended with history of endotracheal tube removal or repositioning.  The heart is enlarged.  Bibasilar opacities are again identified, not significantly changed. No evidence for pneumothorax.  IMPRESSION:  1.  Cardiomegaly and bibasilar opacities, unchanged. 2.  Nasogastric tube and central line as described.  There are three endotracheal tube is not as well seen on the current study and may have been repositioned or withdrawn.  Original Report Authenticated By: Patterson Hammersmith, M.D.     Anti-infectives: Anti-infectives     Start     Dose/Rate Route Frequency Ordered Stop   03/03/12 1800   vancomycin (VANCOCIN) 1,750 mg in sodium chloride 0.9 % 500 mL IVPB  Status:  Discontinued        1,750 mg 250 mL/hr over 120 Minutes Intravenous Every 12 hours 03/03/12 0628 03/03/12 0918   02/29/12 1900   micafungin (MYCAMINE) 100 mg in sodium chloride 0.9 % 100 mL IVPB  Status:  Discontinued        100 mg 100 mL/hr over 1 Hours Intravenous Every 24 hours 02/29/12 1754 03/01/12 0933   02/29/12 1730   vancomycin (VANCOCIN) 1,250 mg in sodium chloride 0.9 % 250 mL IVPB  Status:  Discontinued        1,250 mg 166.7 mL/hr over 90 Minutes Intravenous Every 12 hours 02/29/12 1644 03/03/12 0628   02/29/12 1715   vancomycin (VANCOCIN) IVPB 1000 mg/200 mL premix  Status:  Discontinued        1,000 mg 200 mL/hr over 60 Minutes Intravenous  Once 02/29/12 1637 02/29/12 1647   02/29/12 1645   piperacillin-tazobactam (ZOSYN) IVPB 3.375 g  Status:  Discontinued        3.375 g 100  mL/hr over 30 Minutes Intravenous  Once 02/29/12 1637 02/29/12 1641   02/28/12 1800   piperacillin-tazobactam (ZOSYN) IVPB 3.375 g  Status:  Discontinued        3.375 g 12.5 mL/hr over 240 Minutes Intravenous 4 times per day 02/28/12 1454 02/28/12 1510   02/28/12 1800  piperacillin-tazobactam (ZOSYN) IVPB 3.375 g       3.375 g 12.5 mL/hr over 240 Minutes Intravenous Every 8 hours 02/28/12 1510     02/25/12 1109   dextrose 5 % with cefOXitin (MEFOXIN) ADS Med     Comments: HOLLIE, CHRISTINA: cabinet override         02/25/12 1109 02/25/12 2314   02/25/12 1100   cefOXitin (MEFOXIN) 1 g in dextrose 5 % 50 mL IVPB  Status:  Discontinued        1 g 100 mL/hr over 30 Minutes Intravenous 60 min pre-op 02/25/12 1100 02/25/12 1542          Assessment/Plan: Problem List: Patient Active Problem List  Diagnoses  . Aspiration pneumonia  . Alcohol abuse  . Ileus following gastrointestinal surgery  . BP (high  blood pressure)  . Sinus tachycardia  . Delirium, drug-induced  . Respiratory failure following trauma and surgery  . Agitation    Reintubated for respiratory failure.  No abdominal condition that has not already been addressed is noted.  Supportive measures in place.  5 Days Post-Op    LOS: 12 days   Matt B. Daphine Deutscher, MD, [facility] [phone] beeper [phone]  03/07/2012 8:54 AM

## 2012-03-07 NOTE — Progress Notes (Signed)
Name: Michael Ellison MRN: [id number] DOB: 22-Aug-1946    LOS: 12  PULMONARY / CRITICAL CARE MEDICINE  Pt Profile:   66 year old male w/ pmh of ETOH,  s/p expl lap for SBO due to adhesions on 5/10 at [facility]. Post-op course c/b delirium, persistent ileus distention and episodic desaturation due to suspected aspiration. Transferred to [facility] service for treatment of acute respiratory failure, concern for possible post-op aspiration and delirium. Intubated shortly after arrival.  Interval hx:  5/16: OR for fascial dehiscence repair- closed with wound vac 5/18- failed weaning 5/19- extubated 5/21- re-intubated for hypercarbia   Vital Signs: Temp:  [96.3 F (35.7 C)-98.6 F (37 C)] 96.3 F (35.7 C) (05/21 0359) Pulse Rate:  [57-108] 99  (05/21 0740) Resp:  [24-41] 38  (05/21 0740) BP: (86-183)/(46-92) 110/65 mmHg (05/21 0740) SpO2:  [86 %-100 %] 95 % (05/21 0740) FiO2 (%):  [50 %-100 %] 100 % (05/21 0700)  Physical Examination: General: ill appearing elderly male  Neuro: obtunded, does not follow commands,    HEENT:  ETT Neck:  Supple, no JVD   Cardiovascular: , reg, no M/R/G Lungs: diffuse rhonchi  Abdomen:  Wound vac with retention,clean, low BS Musculoskeletal: mild generalized edema  Skin:  No rash  Principal Problem:  *Respiratory failure following trauma and surgery Active Problems:  Aspiration pneumonia  Alcohol abuse  Ileus following gastrointestinal surgery  BP (high blood pressure)  Sinus tachycardia  Delirium, drug-induced  Agitation   ASSESSMENT AND PLAN  PULMONARY  Lab 03/07/12 0742 03/05/12 1817 03/05/12 0354 03/01/12 0440 02/29/12 1852 02/29/12 1756  PHART 7.144* 7.383 7.445 7.389 -- 7.343*  PCO2ART 96.1* 45.8* 40.7 46.0* -- 42.3  PO2ART 84.0 65.0* 66.9* 63.0* -- 87.0  HCO3 33.6* 27.3* 27.3* 27.7* -- 22.9  O2SAT 93.0 92.0 92.5 91.0 73.2 --   Ventilator Settings: Vent Mode:  [-]  FiO2 (%):  [50 %-100 %] 100 % CXR:  5/21 increase in right > left  airspace disease   ETT:  5/14>>>5/19 >>> 5/21  A:  Acute hypercarbic respiratory failure s/p extubation 5/19 P:   -re-intubate, Full vent support, f/u abg -CXR in am -abg in am after Tv to 600 -will likely need trach due to multiple failed extubations, will d/w family -Hold lasix Keep MV  CARDIOVASCULAR  Lab 03/05/12 1050 03/05/12 0320 03/04/12 1737 03/02/12 0500 02/29/12 1630  TROPONINI <0.30 <0.30 <0.30 -- <0.30  LATICACIDVEN -- -- -- -- 1.3  PROBNP -- -- -- 3782.0* --   ECG:   Lines:  left [place] CVL 5/14>>>  A: Septic shock/hypovolemic shock - resolved,  P:  -monitor tele -lasix dc -if becomes hypotensive again, dc patch catapres rise WBC , hemoconctrations  A:Hypertension, intermittent tachycardia  P: -PRN hydralazine and metoprolol ordered -continue Catapres patch  RENAL  Lab 03/07/12 0437 03/06/12 0430 03/05/12 0330 03/04/12 1800 03/04/12 0511 03/03/12 0510 03/02/12 0500 03/01/12 1223  NA 146* 147* 147* 152* 153* -- -- --  K 4.6 3.3* -- -- -- -- -- --  CL 108 112 112 115* 116* -- -- --  CO2 32 27 28 30  32 -- -- --  BUN 24* 20 20 20  25* -- -- --  CREATININE 1.04 0.88 0.95 0.94 1.02 -- -- --  CALCIUM 8.6 8.3* 8.3* 8.4 8.2* -- -- --  MG -- 2.0 -- -- -- 2.2 1.9 2.3  PHOS -- 3.2 -- -- -- 2.8 1.6* 2.3   Intake/Output      05/20 0701 - 05/21  0700 05/21 0701 - 05/22 0700   I.V. (mL/kg) 770 (9.6)    NG/GT 30    IV Piggyback 366.5    TPN 2039    Total Intake(mL/kg) 3205.5 (40.1)    Urine (mL/kg/hr) 4260 (2.2)    Emesis/NG output 1000    Drains 70    Total Output 5330    Net -2124.5         Stool Occurrence 2 x     Foley:  5/13>>>5/20  A:  Hypokalmemia -resolved P:   -monitor bmp, dc lasix  A: hypernatremia P: - continue D5W 83ml/hr, Na from tpn Dc lasix  GASTROINTESTINAL  Lab 03/06/12 0430 03/02/12 0500 02/29/12 1630  AST 27 25 32  ALT 18 24 27   ALKPHOS 49 78 45  BILITOT 2.2* 0.6 0.7  PROT 6.1 6.1 5.5*  ALBUMIN 1.6* 2.1* 1.9*   ABD Wound  Vac 5/16>>>  CTAP 5/14: Residual dilatation of stomach and proximal small bowel with decompressed distal small bowel and colon. Changes could represent ileus or residual obstruction. Dilatation extends distal to the area of the anastomoses. Nonspecific wall thickening of jejunal loops may represent edema. No free air or free fluid to suggest perforation.   A:  Post op ileus s/p exploratory lap on 5/10 P:   - Cont gastric decompression with NGT - Continue TPN, keep NPO  A: Fascial dehiscence - closure of fascia, wound vac 5/16 P: -Per CCS -wound looks clean overall with vac  HEMATOLOGIC  Lab 03/07/12 0437 03/06/12 0430 03/05/12 0330 03/03/12 0510 03/02/12 0500 02/29/12 1630  HGB 12.4* 11.1* 11.6* 13.4 14.1 --  HCT 39.4 34.8* 35.2* 38.8* 41.5 --  PLT 315 238 215 197 191 --  INR -- -- -- -- -- 1.16  APTT -- -- -- -- -- 21*   A:  Mild Anemia - no evidence of acute blood loss. No indication for PRBCs P:  -Monitor CBC -lovenox, tolerated   INFECTIOUS  Lab 03/07/12 0437 03/06/12 0430 03/05/12 0330 03/03/12 0510 03/02/12 0500 02/29/12 1630  WBC 27.7* 19.6* 25.8* 23.8* 20.4* --  PROCALCITON -- -- -- -- 18.16 52.60   Cultures: BCX2 5/14 >>1/2 gram neg rod>>> 1/2 Klebsiella  UC 5/14 >>Enterococcus  Sputum 5/14 >> GNRs >> Klebsiella pneumoniae Wound 5/14 >> GNRs >>Klebsiella Pneumoniae & Enterobacter Cloacae  UC 5/18>>> BC X2 5/20>>>  Antibiotics: nosocomial asp vanc 5/14 >> 5/17 Zosyn 5/13 >>   A: leukocytosis  P: -recheck lactic acid, PCT -monitor WBC -continue monitor CXR -Continue ABX -CT abd may consider , follow clinical course, afebrile, likley this is prim resp issue  A:  Aspiration PNA  P:   -continue zosyn, follow BC gram neg rod to ID -dc line when able  ENDOCRINE  Lab 03/07/12 0010 03/06/12 1822 03/06/12 1205 03/06/12 0605 03/06/12 0009  GLUCAP 212* 119* 132* 133* 129*   A:  No h/o DM P:   -check CBG q 4 -SSI  NEUROLOGIC  A:  Acute  encephalopathy - markedly improved. Appears to have had adverse reaction to lorazepam but has tolerated midazolam.  Post-operative pain delerium P:   -dc all benzo -prn fentanyl - Cont thiamine and folate with TPN  If needed, would use low dose haldol or precedex Ativan reaction, avoid Keep in icu  BEST PRACTICE / DISPOSITION - Level of Care:  icu - Primary Service:  pccm - Consultants:  CCS - Code Status:  full - Diet:  TPN - DVT Px:  lmwh - GI Px:  ppi - Social / Family: updated multiple times this am   Ccm 35 min    Mcarthur Rossetti. [name], MD, FACP Pgr: [phone] [facility]

## 2012-03-07 NOTE — Procedures (Signed)
Intubation Procedure Note Michael Ellison [id number] October 09, 1946  Procedure: Intubation Indications: Respiratory insufficiency  Procedure Details Consent: Risks of procedure as well as the alternatives and risks of each were explained to the (patient/caregiver).  Consent for procedure obtained. and Unable to obtain consent because of emergent medical necessity. Time Out: Verified patient identification, verified procedure, site/side was marked, verified correct patient position, special equipment/implants available, medications/allergies/relevent history reviewed, required imaging and test results available.  Performed  Maximum sterile technique was used including cap, gloves and mask.  MAC and 4    Evaluation Hemodynamic Status: BP stable throughout; O2 sats: stable throughout Patient's Current Condition: stable Complications: No apparent complications Patient did tolerate procedure well. Chest X-ray ordered to verify placement.  CXR: pending.   BABCOCK,PETE 03/07/2012  Supervised.  Present through the entire procedure.  Orlean Bradford, M.D., F.C.C.P. Pulmonary and Critical Care Medicine [facility] Cell: [phone] Pager: [phone]

## 2012-03-07 NOTE — Progress Notes (Addendum)
PARENTERAL NUTRITION CONSULT NOTE - FOLLOW UP   Pharmacy Consult for TPN Indication: Prolonged ileus s/p SBR for SBO 5/10  Allergies  Allergen Reactions  . Lorazepam Other (See Comments)    Severe agitated delirium. Tolerates midazolam and other benzo's    Patient Measurements: Height: 6\' 2"  (188 cm) Weight: 176 lb 2.4 oz (79.9 kg) IBW/kg (Calculated) : 82.2  Usual Weight: ~82 kg  Vital Signs: Temp: 96.3 F (35.7 C) (05/21 0359) Temp src: Axillary (05/21 0359) BP: 82/56 mmHg (05/21 0930) Pulse Rate: 106  (05/21 0945) Intake/Output from previous day: 05/20 0701 - 05/21 0700 In: 3205.5 [I.V.:770; NG/GT:30; IV Piggyback:366.5; TPN:2039] Out: 5330 [Urine:4260; Emesis/NG output:1000; Drains:70] Intake/Output from this shift: Total I/O In: -  Out: 200 [Urine:200]  Labs:  [facility] 03/07/12 0437 03/06/12 0430 03/05/12 0330  WBC 27.7* 19.6* 25.8*  HGB 12.4* 11.1* 11.6*  HCT 39.4 34.8* 35.2*  PLT 315 238 215  APTT -- -- --  INR -- -- --     Basename 03/07/12 0437 03/06/12 0430 03/05/12 0330  NA 146* 147* 147*  K 4.6 3.3* 4.2  CL 108 112 112  CO2 32 27 28  GLUCOSE 200* 115* 193*  BUN 24* 20 20  CREATININE 1.04 0.88 0.95  LABCREA -- -- --  CREAT24HRUR -- -- --  CALCIUM 8.6 8.3* 8.3*  MG -- 2.0 --  PHOS -- 3.2 --  PROT -- 6.1 --  ALBUMIN -- 1.6* --  AST -- 27 --  ALT -- 18 --  ALKPHOS -- 49 --  BILITOT -- 2.2* --  BILIDIR -- -- --  IBILI -- -- --  PREALBUMIN -- <3.0* --  TRIG -- 87 80  CHOLHDL -- -- --  CHOL -- 68 --   Estimated Creatinine Clearance: 80 ml/min (by C-G formula based on Cr of 1.04).    Basename 03/07/12 0809 03/07/12 0608 03/07/12 0010  GLUCAP 164* 157* 212*    Insulin Requirements in the past 24 hours:  3 units  Nutritional Goals:  1985 kCal, 105-120 grams of protein per day, 2-2.3L  Current Nutrition:  Clinimix E 5/20 at 83 ml/hr + lipids 53ml/hr M/W/F providing 1959 kcal; 100 gm protein providing 98% of kcal and 95% protein  needs.   Assessment: 66 y.o. male on TPN for prolonged ileus s/p SBR for SBO on 5/10 at [facility]. Noted PMH of alcohol abuse.  Pt NPO x 6 days and mild refeeding observed post TPN initiated 03/01/12. Prealbumin remains <3 d/t stress of illness, will adjust formula to provide more protein.   GI: SBO s/p resection 5/10 at [facility], ileus on bowel rest. NPO x 6 days. NGT trending up - 1041ml/24h. S/p OR 5/16 for debridement/abd closure. Endo: No h/o DM. CBGs variable with two readings >200.  Lytes:  Na+ remains up- on D5W at 1ml/hr. Calcium at higher end of normal. Others wnl. Renal: SCr fluctuating, UOP good at 2.2 ml/kg/hr Pulm: Reintubated (5/21), FiO2 50% Cards:  hx HTN. Currently hypotensive and tachycardic. No pressors, remains on clonidine patch,  lopressor and hydralazine PRN Hepatobil: AST/ALT ok, tbil up at 2.2. Trig WNL, prealbumin remains low. Neuro: acute encephalopathy (ativan allergy); fent gtt ID: Zosyn [id number] for Kleb PNA, presumed aspiration/Kleb and Enterobacter in wound/20 kcol enterococcus in urine. Afebrile, WBC elevated at 27.7. Best Practices:  Lovenox, PPI IV, MC  Plan:  - Increase SSI to moderate and f/u CBG to add insulin to TNA if needed - F/u with d/cing antihypertensives - Change Clinimix  to E 5/15 and increase rate to 145ml/hr, this will provide 120g of protein daily (100% of protein needs)  - Multivitamins, trace elements, and lipids on M/W/F due to national shortage. - Thiamine and folic acid daily in TPN - F/U AM labs - Continue to trend prealbumin  Thank you,  Brett Fairy, PharmD Pager: [phone]  03/07/2012 10:34 AM

## 2012-03-07 NOTE — Progress Notes (Signed)
Clinical Social Worker received referral from [facility] for potential SNF placement.  CSW to begin paperwork process and follow up with pt/family once medically appropriate.   Angelia Mould, MSW, [place] [phone]

## 2012-03-08 ENCOUNTER — Inpatient Hospital Stay (HOSPITAL_COMMUNITY): Payer: Medicare Other

## 2012-03-08 ENCOUNTER — Encounter (HOSPITAL_COMMUNITY): Payer: Self-pay

## 2012-03-08 LAB — POCT I-STAT 3, ART BLOOD GAS (G3+)
Acid-Base Excess: 5 mmol/L — ABNORMAL HIGH (ref 0.0–2.0)
Patient temperature: 98.6

## 2012-03-08 LAB — COMPREHENSIVE METABOLIC PANEL
Alkaline Phosphatase: 49 U/L (ref 39–117)
BUN: 30 mg/dL — ABNORMAL HIGH (ref 6–23)
CO2: 32 mEq/L (ref 19–32)
Chloride: 106 mEq/L (ref 96–112)
Creatinine, Ser: 0.97 mg/dL (ref 0.50–1.35)
GFR calc Af Amer: >90 mL/min (ref >90)
GFR calc non Af Amer: 85 mL/min — ABNORMAL LOW (ref >90)
Glucose, Bld: 107 mg/dL — ABNORMAL HIGH (ref 70–99)
Potassium: 4 mEq/L (ref 3.5–5.1)
Total Bilirubin: 1.4 mg/dL — ABNORMAL HIGH (ref 0.3–1.2)

## 2012-03-08 LAB — GLUCOSE, CAPILLARY: Glucose-Capillary: 110 mg/dL — ABNORMAL HIGH (ref 70–99)

## 2012-03-08 LAB — CBC
HCT: 30.4 % — ABNORMAL LOW (ref 39.0–52.0)
Hemoglobin: 9.8 g/dL — ABNORMAL LOW (ref 13.0–17.0)
MCV: 93.3 fL (ref 78.0–100.0)
WBC: 16.4 10*3/uL — ABNORMAL HIGH (ref 4.0–10.5)

## 2012-03-08 LAB — TRIGLYCERIDES: Triglycerides: 93 mg/dL (ref <150)

## 2012-03-08 MED ORDER — FENTANYL CITRATE 0.05 MG/ML IJ SOLN: 25.0000 ug | INTRAMUSCULAR | Status: AC | PRN

## 2012-03-08 MED ORDER — FAT EMULSION 20 % IV EMUL
250.0000 mL | INTRAVENOUS | Status: AC
  Administered 2012-03-08: 250 mL via INTRAVENOUS
  Filled 2012-03-08: qty 250

## 2012-03-08 MED ORDER — SODIUM CHLORIDE 0.9 % IV SOLN
INTRAVENOUS | Status: DC | PRN
  Administered 2012-03-08 – 2012-03-09 (×2): 20 mL/h via INTRAVENOUS
  Administered 2012-03-11: 20 mL via INTRAVENOUS
  Administered 2012-03-13: 20 mL/h via INTRAVENOUS
  Administered 2012-03-15: 20 mL via INTRAVENOUS
  Administered 2012-03-16 – 2012-03-17 (×2): via INTRAVENOUS
  Administered 2012-03-19: 10 mL/h via INTRAVENOUS

## 2012-03-08 MED ORDER — FUROSEMIDE 10 MG/ML IJ SOLN
20.0000 mg | Freq: Two times a day (BID) | INTRAMUSCULAR | Status: AC
  Administered 2012-03-08 – 2012-03-09 (×4): 20 mg via INTRAVENOUS
  Filled 2012-03-08 (×5): qty 2

## 2012-03-08 MED ORDER — TRACE MINERALS CR-CU-MN-SE-ZN 10-1000-500-60 MCG/ML IV SOLN
INTRAVENOUS | Status: AC
  Administered 2012-03-08: 18:00:00 via INTRAVENOUS
  Filled 2012-03-08: qty 2400

## 2012-03-08 NOTE — Progress Notes (Signed)
Name: Michael Ellison MRN: [id number] DOB: 24-May-1946    LOS: 13  PULMONARY / CRITICAL CARE MEDICINE  Pt Profile:   66 year old male w/ pmh of ETOH,  s/p expl lap for SBO due to adhesions on 5/10 at [facility]. Post-op course c/b delirium, persistent ileus distention and episodic desaturation due to suspected aspiration. Transferred to [facility] service for treatment of acute respiratory failure, concern for possible post-op aspiration and delirium. Intubated shortly after arrival.  Interval hx:  5/16: OR for fascial dehiscence repair- closed with wound vac 5/18- failed weaning 5/19- extubated 5/21- re-intubated for hypercarbia 5/22-improved co2 and neurostatus, pos almost 2 liters  Vital Signs: Temp:  [97.5 F (36.4 C)-99.2 F (37.3 C)] 98.7 F (37.1 C) (05/22 0747) Pulse Rate:  [58-114] 92  (05/22 0816) Resp:  [20-33] 26  (05/22 0816) BP: (68-137)/(52-81) 123/77 mmHg (05/22 0816) SpO2:  [91 %-100 %] 96 % (05/22 0816) FiO2 (%):  [40 %-50 %] 40 % (05/22 0816) Weight:  [77.1 kg (169 lb 15.6 oz)] 77.1 kg (169 lb 15.6 oz) (05/22 0400)  Physical Examination: General: ill appearing elderly male, awake Neuro: follows commands well, nonfocal    HEENT:  ETT Neck:  Supple, no JVD   Cardiovascular: , reg, no M/R/G Lungs: reduced rt base, left coarse crackles wet Abdomen:  Wound vac with retention,clean, low BS, clean Musculoskeletal: mild generalized edema  Skin:  No rash  Principal Problem:  *Respiratory failure following trauma and surgery Active Problems:  Aspiration pneumonia  Alcohol abuse  Ileus following gastrointestinal surgery  BP (high blood pressure)  Sinus tachycardia  Delirium, drug-induced  Agitation   ASSESSMENT AND PLAN  PULMONARY  Lab 03/08/12 0322 03/07/12 0911 03/07/12 0742 03/05/12 1817 03/05/12 0354  PHART 7.386 7.267* 7.144* 7.383 7.445  PCO2ART 51.1* 65.9* 96.1* 45.8* 40.7  PO2ART 74.0* 249.0* 84.0 65.0* 66.9*  HCO3 30.6* 30.3* 33.6* 27.3* 27.3*    O2SAT 94.0 100.0 93.0 92.0 92.5   Ventilator Settings: Vent Mode:  [-] PRVC FiO2 (%):  [40 %-50 %] 40 % Set Rate:  [20 bmp] 20 bmp Vt Set:  [520 mL-600 mL] 600 mL PEEP:  [5 cmH20] 5 cmH20 Plateau Pressure:  [16 cmH20-20 cmH20] 20 cmH20 CXR:  5/21 increase in right > left airspace disease   ETT:  5/14>>>5/19 >>> 5/21  A:  Acute hypercarbic respiratory failure s/p extubation 5/19 P:   -rabg reviewed, correction of co2 -pcxr rt base improved aeration, likely he mucous plugged as reason for reintubation insetting of abdo distention and copd -wean cpap 5 ps 5 , assess rsbi -trach planned 930 am  And bronch with trach  assessment rt base  CARDIOVASCULAR  Lab 03/07/12 1230 03/05/12 1050 03/05/12 0320 03/04/12 1737 03/02/12 0500  TROPONINI -- <0.30 <0.30 <0.30 --  LATICACIDVEN 1.0 -- -- -- --  PROBNP -- -- -- -- 3782.0*   ECG:   Lines:  left [place] CVL 5/14>>>  A: Septic shock/hypovolemic shock - resolved,  P:  -pos balance, lasix 20 q12h tele  A:Hypertension, intermittent tachycardia  P: -PRN hydralazine -episode brady, dc metoprolol -Catapres patch held, restart when able to avoid rebound  RENAL  Lab 03/08/12 0447 03/07/12 0437 03/06/12 0430 03/05/12 0330 03/04/12 1800 03/03/12 0510 03/02/12 0500 03/01/12 1223  NA 142 146* 147* 147* 152* -- -- --  K 4.0 4.6 -- -- -- -- -- --  CL 106 108 112 112 115* -- -- --  CO2 32 32 27 28 30  -- -- --  BUN 30* 24* 20 20 20  -- -- --  CREATININE 0.97 1.04 0.88 0.95 0.94 -- -- --  CALCIUM 8.0* 8.6 8.3* 8.3* 8.4 -- -- --  MG -- -- 2.0 -- -- 2.2 1.9 2.3  PHOS -- -- 3.2 -- -- 2.8 1.6* 2.3   Intake/Output      05/21 0701 - 05/22 0700 05/22 0701 - 05/23 0700   I.V. (mL/kg) 1010 (13.1) 120 (1.6)   NG/GT 90    IV Piggyback 125    TPN 2230 200   Total Intake(mL/kg) 3455 (44.8) 320 (4.2)   Urine (mL/kg/hr) 1100 (0.6)    Emesis/NG output 600    Drains     Total Output 1700    Net +1755 +320        Urine Occurrence 1 x    Stool  Occurrence 2 x     Foley:  5/13>>>5/20  A:  Hypokalmemia -resolved P:   -monitor bmp, lasix to even balance  A: hypernatremia P: Dc d5w lasix  GASTROINTESTINAL  Lab 03/08/12 0447 03/06/12 0430 03/02/12 0500  AST 19 27 25   ALT 16 18 24   ALKPHOS 49 49 78  BILITOT 1.4* 2.2* 0.6  PROT 6.1 6.1 6.1  ALBUMIN 1.6* 1.6* 2.1*   ABD Wound Vac 5/16>>>  CTAP 5/14: Residual dilatation of stomach and proximal small bowel with decompressed distal small bowel and colon. Changes could represent ileus or residual obstruction. Dilatation extends distal to the area of the anastomoses. Nonspecific wall thickening of jejunal loops may represent edema. No free air or free fluid to suggest perforation.   A:  Post op ileus s/p exploratory lap on 5/10 P:   - Cont gastric decompression with NGT - Continue TPN, keep NPO  A: Fascial dehiscence - closure of fascia, wound vac 5/16 P: -Per CCS -wound looks clean overall with vac 5/21lactic, pct unimpressive for active abdo process   HEMATOLOGIC  Lab 03/08/12 0720 03/08/12 0447 03/07/12 0437 03/06/12 0430 03/05/12 0330 03/03/12 0510  HGB -- 9.8* 12.4* 11.1* 11.6* 13.4  HCT -- 30.4* 39.4 34.8* 35.2* 38.8*  PLT -- 293 315 238 215 197  INR 1.30 -- -- -- -- --  APTT 40* -- -- -- -- --   A:  Mild Anemia - no evidence of acute blood loss. No indication for PRBCs P:  -Monitor CBC -lovenox, ok for trach  INFECTIOUS  Lab 03/08/12 0447 03/07/12 1227 03/07/12 0437 03/06/12 0430 03/05/12 0330 03/03/12 0510 03/02/12 0500  WBC 16.4* -- 27.7* 19.6* 25.8* 23.8* --  PROCALCITON -- 3.70 -- -- -- -- 18.16   Cultures: BCX2 5/14 >>1/2 gram neg rod>>> 1/2 Klebsiella  UC 5/14 >>Enterococcus  Sputum 5/14 >> GNRs >> Klebsiella pneumoniae Wound 5/14 >> GNRs >>Klebsiella Pneumoniae & Enterobacter Cloacae  UC 5/18>>> BC X2 5/20>>>  Antibiotics: nosocomial asp vanc 5/14 >> 5/17 Zosyn 5/13 >>   A: leukocytosis  P: -recheck lactic acid, PCT- neg, good  trend -zosyn is day 10, upon evaluation rt base bronch and new resp failure, likely needs longer duration\ -repeat BC to follow as well  A:  Aspiration PNA  P:   -continue zosyn, see above  ENDOCRINE  Lab 03/08/12 0725 03/08/12 0325 03/07/12 2349 03/07/12 1946 03/07/12 1608  GLUCAP 114* 115* 110* 105* 122*   A:  No h/o DM P:   -check CBG q 4 -SSI  NEUROLOGIC  A:  Acute encephalopathy - markedly improved. Appears to have had adverse reaction to lorazepam but has tolerated  midazolam.  Post-operative pain delerium P:   -dc haldol, excellent neurostatus fent -aggressive pt post trach  BEST PRACTICE / DISPOSITION - Level of Care:  icu - Primary Service:  pccm - Consultants:  CCS - Code Status:  full - Diet:  TPN - DVT Px:  lmwh - GI Px:  ppi - Social / Family: agreed trach  Ccm 30 min    Mcarthur Rossetti. [name], MD, FACP Pgr: [phone] [place]

## 2012-03-08 NOTE — Progress Notes (Signed)
Patient ID: Michael Ellison, male   DOB: 1946/01/26, 66 y.o.   MRN: [id number] 6 Days Post-Op  Subjective: Pt awake on vent with trach in place.  Just got trach this morning.  Patient says he is passing some flatus.  RN reports a smear for a BM over night.  Objective: Vital signs in last 24 hours: Temp:  [97.5 F (36.4 C)-99.2 F (37.3 C)] 98.7 F (37.1 C) (05/22 0747) Pulse Rate:  [58-114] 92  (05/22 1100) Resp:  [19-33] 20  (05/22 1100) BP: (68-145)/(52-83) 88/63 mmHg (05/22 1100) SpO2:  [91 %-100 %] 100 % (05/22 1100) FiO2 (%):  [40 %-50 %] 40 % (05/22 0816) Weight:  [169 lb 15.6 oz (77.1 kg)] 169 lb 15.6 oz (77.1 kg) (05/22 0400) Last BM Date: 03/07/12  Intake/Output from previous day: 05/21 0701 - 05/22 0700 In: 3455 [I.V.:1010; NG/GT:90; IV Piggyback:125; TPN:2230] Out: 1700 [Urine:1100; Emesis/NG output:600] Intake/Output this shift: Total I/O In: 721.7 [I.V.:171.7; IV Piggyback:50; TPN:500] Out: -   PE: Abd: soft, hypoactive BS, tender, VAC in place with retention sutures.  Lab Results:   Basename 03/08/12 0447 03/07/12 0437  WBC 16.4* 27.7*  HGB 9.8* 12.4*  HCT 30.4* 39.4  PLT 293 315   BMET  Basename 03/08/12 0447 03/07/12 0437  NA 142 146*  K 4.0 4.6  CL 106 108  CO2 32 32  GLUCOSE 107* 200*  BUN 30* 24*  CREATININE 0.97 1.04  CALCIUM 8.0* 8.6   PT/INR  Basename 03/08/12 0720  LABPROT 16.4*  INR 1.30   CMP     Component Value Date/Time   NA 142 03/08/2012 0447   K 4.0 03/08/2012 0447   CL 106 03/08/2012 0447   CO2 32 03/08/2012 0447   GLUCOSE 107* 03/08/2012 0447   BUN 30* 03/08/2012 0447   CREATININE 0.97 03/08/2012 0447   CALCIUM 8.0* 03/08/2012 0447   PROT 6.1 03/08/2012 0447   ALBUMIN 1.6* 03/08/2012 0447   AST 19 03/08/2012 0447   ALT 16 03/08/2012 0447   ALKPHOS 49 03/08/2012 0447   BILITOT 1.4* 03/08/2012 0447   GFRNONAA 85* 03/08/2012 0447   GFRAA >90 03/08/2012 0447   Lipase     Component Value Date/Time   LIPASE 18 02/24/2012 0858        Studies/Results: Chest Portable 1 View To Assess Tube Placement And Rule-out Pneumothorax  03/08/2012  *RADIOLOGY REPORT*  Clinical Data: Status post tracheostomy.  PORTABLE CHEST - 1 VIEW  Comparison: Earlier same date and 03/07/2012.  Findings: 1035 hours.  Interval tracheostomy.  Tracheostomy appears well positioned.  The left subclavian central line and nasogastric tube are unchanged in position.  There are stable pleural effusions and bibasilar air space opacities.  Apical bullous changes are again noted.  There is no evidence of pneumothorax.  IMPRESSION: Interval tracheostomy without demonstrated complication.  Stable pleural effusions and basilar air space opacities.  Original Report Authenticated By: Gerrianne Scale, M.D.   Dg Chest Port 1 View  03/08/2012  *RADIOLOGY REPORT*  Clinical Data: Evaluate endotracheal tube placement.  Shortness of breath.  PORTABLE CHEST - 1 VIEW  Comparison: Chest x-ray 03/07/2012.  Findings: An endotracheal tube is in place with tip 3.7 cm above the carina. There is a left-sided subclavian central venous catheter with tip terminating in the mid SVC. A nasogastric tube is seen extending into the stomach, however, the tip of the nasogastric tube extends below the lower margin of the image. The lung volumes are low and there  are bibasilar opacities (right greater than left) which may reflect areas of atelectasis and/or consolidation, with superimposed moderate right-sided pleural effusion.  No evidence of pulmonary edema.  Heart size is normal. Mediastinal contours are within normal limits.  Atherosclerotic calcifications within the arch of the aorta.  IMPRESSION: 1.  Support apparatus, as above. 2.  Radiographic appearance of the chest is essentially unchanged, as detailed above.  Original Report Authenticated By: Florencia Reasons, M.D.   Dg Chest Port 1 View  03/07/2012  *RADIOLOGY REPORT*  Clinical Data: Endotracheal tube placement.  PORTABLE CHEST - 1  VIEW  Comparison: Earlier same date and 03/06/2012.  Findings: 0823 hours.  Interval intubation.  Tip of endotracheal tube is approximately 4.0 cm above the carina.  Left subclavian central line and nasogastric tube are stable in position.  There are stable bibasilar air space opacities and bilateral pleural effusions.  Apical bullous changes are noted bilaterally.  There is no evidence of pneumothorax.  IMPRESSION:  1.  Endotracheal tube appears satisfactorily positioned. 2.  No change in the basilar air space opacities and pleural effusions.  Original Report Authenticated By: Gerrianne Scale, M.D.   Dg Chest Port 1 View  03/07/2012  *RADIOLOGY REPORT*  Clinical Data: Shortness of breath.  PORTABLE CHEST - 1 VIEW  Comparison: Radiographs 03/06/2012 and CT 02/29/2012.  Findings: 0426 hours.  Nasogastric tube and left subclavian central line are stable in position.  No endotracheal tube is visualized. The heart size and mediastinal contours are stable.  There are worsening bibasilar air space opacities and probable enlarging bilateral pleural effusions.  Extensive bullous changes at both lung apices are stable.  No pneumothorax is evident.  IMPRESSION:  1.  Worsening basilar air space opacities and probable enlarging bilateral pleural effusions. 2.  Stable bullous changes at both apices.  Original Report Authenticated By: Gerrianne Scale, M.D.   Dg Chest Port 1 View  03/06/2012  *RADIOLOGY REPORT*  Clinical Data: Shortness of breath.  PORTABLE CHEST - 1 VIEW  Comparison: 03/05/2012  Findings: The patient has a nasogastric tube, tip off the radiographic field.  Left-sided subclavian central line tip overlies the level of the superior vena cava. Endotracheal tube is not as well seen on the current study.  Correlation is recommended with history of endotracheal tube removal or repositioning.  The heart is enlarged.  Bibasilar opacities are again identified, not significantly changed. No evidence for  pneumothorax.  IMPRESSION:  1.  Cardiomegaly and bibasilar opacities, unchanged. 2.  Nasogastric tube and central line as described.  There are three endotracheal tube is not as well seen on the current study and may have been repositioned or withdrawn.  Original Report Authenticated By: Patterson Hammersmith, M.D.    Anti-infectives: Anti-infectives     Start     Dose/Rate Route Frequency Ordered Stop   03/03/12 1800   vancomycin (VANCOCIN) 1,750 mg in sodium chloride 0.9 % 500 mL IVPB  Status:  Discontinued        1,750 mg 250 mL/hr over 120 Minutes Intravenous Every 12 hours 03/03/12 0628 03/03/12 0918   02/29/12 1900   micafungin (MYCAMINE) 100 mg in sodium chloride 0.9 % 100 mL IVPB  Status:  Discontinued        100 mg 100 mL/hr over 1 Hours Intravenous Every 24 hours 02/29/12 1754 03/01/12 0933   02/29/12 1730   vancomycin (VANCOCIN) 1,250 mg in sodium chloride 0.9 % 250 mL IVPB  Status:  Discontinued  1,250 mg 166.7 mL/hr over 90 Minutes Intravenous Every 12 hours 02/29/12 1644 03/03/12 0628   02/29/12 1715   vancomycin (VANCOCIN) IVPB 1000 mg/200 mL premix  Status:  Discontinued        1,000 mg 200 mL/hr over 60 Minutes Intravenous  Once 02/29/12 1637 02/29/12 1647   02/29/12 1645   piperacillin-tazobactam (ZOSYN) IVPB 3.375 g  Status:  Discontinued        3.375 g 100 mL/hr over 30 Minutes Intravenous  Once 02/29/12 1637 02/29/12 1641   02/28/12 1800   piperacillin-tazobactam (ZOSYN) IVPB 3.375 g  Status:  Discontinued        3.375 g 12.5 mL/hr over 240 Minutes Intravenous 4 times per day 02/28/12 1454 02/28/12 1510   02/28/12 1800  piperacillin-tazobactam (ZOSYN) IVPB 3.375 g       3.375 g 12.5 mL/hr over 240 Minutes Intravenous Every 8 hours 02/28/12 1510     02/25/12 1109   dextrose 5 % with cefOXitin (MEFOXIN) ADS Med     Comments: HOLLIE, CHRISTINA: cabinet override         02/25/12 1109 02/25/12 2314   02/25/12 1100   cefOXitin (MEFOXIN) 1 g in dextrose 5 % 50  mL IVPB  Status:  Discontinued        1 g 100 mL/hr over 30 Minutes Intravenous 60 min pre-op 02/25/12 1100 02/25/12 1542           Assessment/Plan  1. S/p ex lap with SBR at [facility], s/p ex lap with closure of fascia and placement of VAC and retention sutures for fascial dehiscence  2. VDRF 3. Asp PNA 4. Post op ileus  Plan: 1. Ok to start PT/OT from our standpoint 2. Cont VAC changes on MWF 3. Patient having some flatus, still with hypoactive BS and NGT output.  Will keep on suction, hopefully can stop suction in next several days and look at transitioning TNA to TFs.   LOS: 13 days    Jeyli Zwicker E 03/08/2012

## 2012-03-08 NOTE — Progress Notes (Signed)
PARENTERAL NUTRITION CONSULT NOTE - FOLLOW UP   Pharmacy Consult for TPN Indication: Prolonged ileus s/p SBR for SBO 5/10  Allergies  Allergen Reactions  . Lorazepam Other (See Comments)    Severe agitated delirium. Tolerates midazolam and other benzo's    Patient Measurements: Height: 6\' 2"  (188 cm) Weight: 169 lb 15.6 oz (77.1 kg) IBW/kg (Calculated) : 82.2  Usual Weight: ~82 kg  Vital Signs: Temp: 98.7 F (37.1 C) (05/22 0747) Temp src: Oral (05/22 0747) BP: 123/77 mmHg (05/22 0816) Pulse Rate: 92  (05/22 0816) Intake/Output from previous day: 05/21 0701 - 05/22 0700 In: 3455 [I.V.:1010; NG/GT:90; IV Piggyback:125; TPN:2230] Out: 1700 [Urine:1100; Emesis/NG output:600] Intake/Output from this shift: Total I/O In: 320 [I.V.:120; TPN:200] Out: -   Labs:  Basename 03/08/12 0720 03/08/12 0447 03/07/12 0437 03/06/12 0430  WBC -- 16.4* 27.7* 19.6*  HGB -- 9.8* 12.4* 11.1*  HCT -- 30.4* 39.4 34.8*  PLT -- 293 315 238  APTT 40* -- -- --  INR 1.30 -- -- --     Basename 03/08/12 0447 03/07/12 0437 03/06/12 0430  NA 142 146* 147*  K 4.0 4.6 3.3*  CL 106 108 112  CO2 32 32 27  GLUCOSE 107* 200* 115*  BUN 30* 24* 20  CREATININE 0.97 1.04 0.88  LABCREA -- -- --  CREAT24HRUR -- -- --  CALCIUM 8.0* 8.6 8.3*  MG -- -- 2.0  PHOS -- -- 3.2  PROT 6.1 -- 6.1  ALBUMIN 1.6* -- 1.6*  AST 19 -- 27  ALT 16 -- 18  ALKPHOS 49 -- 49  BILITOT 1.4* -- 2.2*  BILIDIR -- -- --  IBILI -- -- --  PREALBUMIN -- -- <3.0*  TRIG 93 -- 87  CHOLHDL -- -- --  CHOL -- -- 68   Estimated Creatinine Clearance: 82.8 ml/min (by C-G formula based on Cr of 0.97).    Basename 03/08/12 0725 03/08/12 0325 03/07/12 2349  GLUCAP 114* 115* 110*    Insulin Requirements in the past 24 hours:  9 units  Nutritional Goals:  1850-1950 kCal, 105-120 grams of protein per day, 2-2.3L  Current Nutrition:  Clinimix E 5/20 at 100 ml/hr + lipids 41ml/hr M/W/F providing 1910 kcal; 120 gm protein  providing 100% of kcal and 100% protein needs.   Assessment: 66 y.o. male on TPN for prolonged ileus s/p SBR for SBO on 5/10 at [facility]. Noted PMH of alcohol abuse.  Pt NPO x 6 days and mild refeeding observed post TPN initiated 03/01/12. Prealbumin remains <3 d/t stress of illness, adjusted formula to provide more protein.   GI: SBO s/p resection 5/10 at [facility], ileus on bowel rest. NPO. NGT trending up - 1047ml/24h. S/p OR 5/16 for debridement/abd closure. Noted two bowel movements yesterday.  Endo: No h/o DM. CBGs well controlled   Lytes: All lytes wnl. Noted pt started on lasix yesterday.  Renal: SCr trending down, UOP decrease to 0.6 ml/kg/hr Pulm: Reintubated (5/21), FiO2 40% Cards:  hx HTN. Currently hypotensive and slightly tachycardic. No pressors, hydralazine PRN Hepatobil: AST/ALT ok, tbil elevated but trending down. Trig WNL, prealbumin remains low. Neuro: acute encephalopathy (ativan allergy); fent gtt ID: Zosyn [id number] for Kleb PNA, presumed aspiration/Kleb and Enterobacter in wound/20 kcol enterococcus in urine. Afebrile, WBC elevated at 16.4 but trending down. Best Practices:  Lovenox, PPI IV, MC  Plan:  - Address SSI needs tomorrow and determine if still needed - Continue Clinimix E 5/15 at 154ml/hr - Multivitamins, trace elements, and  lipids on M/W/F due to national shortage. - Thiamine and folic acid daily in TPN - F/U AM labs - Continue to trend prealbumin  Thank you,  Brett Fairy, PharmD Pager: [phone]  03/08/2012 10:04 AM

## 2012-03-08 NOTE — Procedures (Signed)
Bedside Tracheostomy Insertion Procedure Note   Patient Details:   Name: Michael Ellison DOB: [birth year] MRN: [id number]  Procedure: Tracheostomy  Pre Procedure Assessment: ET Tube Size: ET Tube secured at lip (cm): Bite block in place: Yes Breath Sounds: Clear  Post Procedure Assessment: BP 123/77  Pulse 92  Temp(Src) 98.7 F (37.1 C) (Oral)  Resp 26  Ht 6\' 2"  (1.88 m)  Wt 169 lb 15.6 oz (77.1 kg)  BMI 21.82 kg/m2  SpO2 96% O2 sats: stable throughout Complications: No apparent complications Patient did tolerate procedure well Tracheostomy Brand:Shiley Tracheostomy Style:Cuffed Tracheostomy Size: 8 Tracheostomy Secured WUJ:[id number], velcro Tracheostomy Placement Confirmation:Trach cuff visualized and in place, cxr taken for placement    Jerome Viglione Ann 03/08/2012, 10:45 AM

## 2012-03-08 NOTE — Progress Notes (Signed)
SLP Cancellation Note  PMSV evaluation cancelled today due to medical issues with patient which prohibited therapy.  Patient has just received trach this am, remains on vent. Per RN, plans for trach collar trials (2 hours) today. Will f/u in am.   Ferdinand Lango MA, CCC-SLP [phone]   Ferdinand Lango Meryl 03/08/2012, 11:40 AM

## 2012-03-09 ENCOUNTER — Inpatient Hospital Stay (HOSPITAL_COMMUNITY): Payer: Medicare Other

## 2012-03-09 LAB — COMPREHENSIVE METABOLIC PANEL
Alkaline Phosphatase: 63 U/L (ref 39–117)
BUN: 27 mg/dL — ABNORMAL HIGH (ref 6–23)
Creatinine, Ser: 0.94 mg/dL (ref 0.50–1.35)
GFR calc Af Amer: >90 mL/min (ref >90)
Glucose, Bld: 109 mg/dL — ABNORMAL HIGH (ref 70–99)
Potassium: 3.9 mEq/L (ref 3.5–5.1)
Total Bilirubin: 2.6 mg/dL — ABNORMAL HIGH (ref 0.3–1.2)
Total Protein: 7 g/dL (ref 6.0–8.3)

## 2012-03-09 LAB — MAGNESIUM: Magnesium: 2.2 mg/dL (ref 1.5–2.5)

## 2012-03-09 LAB — GLUCOSE, CAPILLARY
Glucose-Capillary: 116 mg/dL — ABNORMAL HIGH (ref 70–99)
Glucose-Capillary: 129 mg/dL — ABNORMAL HIGH (ref 70–99)
Glucose-Capillary: 135 mg/dL — ABNORMAL HIGH (ref 70–99)
Glucose-Capillary: 141 mg/dL — ABNORMAL HIGH (ref 70–99)

## 2012-03-09 LAB — PHOSPHORUS: Phosphorus: 3.4 mg/dL (ref 2.3–4.6)

## 2012-03-09 MED ORDER — CLINIMIX E/DEXTROSE (5/15) 5 % IV SOLN
INTRAVENOUS | Status: AC
  Administered 2012-03-09: 18:00:00 via INTRAVENOUS
  Filled 2012-03-09: qty 2400

## 2012-03-09 MED ORDER — POTASSIUM CHLORIDE 10 MEQ/50ML IV SOLN
10.0000 meq | INTRAVENOUS | Status: AC
  Administered 2012-03-09 (×2): 10 meq via INTRAVENOUS
  Filled 2012-03-09 (×2): qty 50

## 2012-03-09 MED ORDER — METOPROLOL TARTRATE 1 MG/ML IV SOLN
2.5000 mg | Freq: Three times a day (TID) | INTRAVENOUS | Status: DC
  Administered 2012-03-09 – 2012-03-12 (×9): 2.5 mg via INTRAVENOUS
  Administered 2012-03-12: 15:00:00 via INTRAVENOUS
  Administered 2012-03-12 – 2012-04-03 (×64): 2.5 mg via INTRAVENOUS
  Filled 2012-03-09 (×82): qty 5

## 2012-03-09 MED ORDER — MIDAZOLAM HCL 2 MG/2ML IJ SOLN: 2.0000 mg | INTRAMUSCULAR | Status: DC | PRN

## 2012-03-09 NOTE — Progress Notes (Signed)
Name: Michael Ellison MRN: [id number] DOB: 1946/02/08    LOS: 14  PULMONARY / CRITICAL CARE MEDICINE  Pt Profile:   66 yo male smoker admitted to [facility] 02/24/2012 with n/v and abdominal pain due to SBO due to adhesions.  Had ex lap with SB resection 5/10.  Developed delirium post-op.  Transferred to [facility] 5/14 for respiratory failure requiring intubation.  Developed fascial dehiscence and taken back to OR 5/16.  Failed extubation, and required tracheostomy 5/22. PMHx ETOH, HTN, Prostate cancer   Interval hx:  5/16: OR for fascial dehiscence repair- closed with wound vac 5/18- failed weaning 5/19- extubated 5/21- re-intubated for hypercarbia 5/22-improved co2 and neurostatus, pos almost 2 liters 5/22 SVT>>resolved spontaneously  Subjective: Tolerating pressure support.  Still has abdominal discomfort.  Denies chest pain.  Feels breathing is okay.  Vital Signs: Temp:  [98.1 F (36.7 C)-98.9 F (37.2 C)] 98.2 F (36.8 C) (05/23 0756) Pulse Rate:  [74-108] 95  (05/23 0833) Resp:  [19-36] 32  (05/23 0833) BP: (85-155)/(62-99) 124/76 mmHg (05/23 0833) SpO2:  [86 %-100 %] 98 % (05/23 0833) FiO2 (%):  [40 %] 40 % (05/23 0833) Weight:  [160 lb 4.4 oz (72.7 kg)] 160 lb 4.4 oz (72.7 kg) (05/23 0500)  Physical Examination: General - no distress HEENT - trach site clean Cardiac - s1s2 regular, no murmur Chest - b/l rhonchi, no wheeze Abd - wound vac in place, tender with mild palpation Ext - no edema Neuro - follows commands, alert  Lab Results  Component Value Date   WBC 16.4* 03/08/2012   HGB 9.8* 03/08/2012   HCT 30.4* 03/08/2012   MCV 93.3 03/08/2012   PLT 293 03/08/2012    BMET    Component Value Date/Time   NA 143 03/09/2012 0500   K 3.9 03/09/2012 0500   CL 103 03/09/2012 0500   CO2 32 03/09/2012 0500   GLUCOSE 109* 03/09/2012 0500   BUN 27* 03/09/2012 0500   CREATININE 0.94 03/09/2012 0500   CALCIUM 8.6 03/09/2012 0500   GFRNONAA 86* 03/09/2012 0500   GFRAA >90 03/09/2012 0500      Lab Results  Component Value Date   ALT 17 03/09/2012   AST 20 03/09/2012   ALKPHOS 63 03/09/2012   BILITOT 2.6* 03/09/2012    Dg Chest Port 1 View  03/09/2012  *RADIOLOGY REPORT*  Clinical Data: Tracheostomy tube  PORTABLE CHEST - 1 VIEW  Comparison: Mar 08, 2012  Findings: The tracheostomy is stable in good position.  The left subclavian central line tip is stable at the cavoatrial junction. The nasogastric tube tip courses off the inferior film.  Bibasilar airspace opacities and effusions, right greater than left, do not appear significantly changed.  Apical bullous disease is again noted.  IMPRESSION: Stable chest x-ray with bibasilar airspace opacities and small effusions,  right greater than left.  Original Report Authenticated By: Brandon Melnick, M.D.   Chest Portable 1 View To Assess Tube Placement And Rule-out Pneumothorax  03/08/2012  *RADIOLOGY REPORT*  Clinical Data: Status post tracheostomy.  PORTABLE CHEST - 1 VIEW  Comparison: Earlier same date and 03/07/2012.  Findings: 1035 hours.  Interval tracheostomy.  Tracheostomy appears well positioned.  The left subclavian central line and nasogastric tube are unchanged in position.  There are stable pleural effusions and bibasilar air space opacities.  Apical bullous changes are again noted.  There is no evidence of pneumothorax.  IMPRESSION: Interval tracheostomy without demonstrated complication.  Stable pleural effusions and basilar air space  opacities.  Original Report Authenticated By: Gerrianne Scale, M.D.   Dg Chest Port 1 View  03/08/2012  *RADIOLOGY REPORT*  Clinical Data: Evaluate endotracheal tube placement.  Shortness of breath.  PORTABLE CHEST - 1 VIEW  Comparison: Chest x-ray 03/07/2012.  Findings: An endotracheal tube is in place with tip 3.7 cm above the carina. There is a left-sided subclavian central venous catheter with tip terminating in the mid SVC. A nasogastric tube is seen extending into the stomach, however, the  tip of the nasogastric tube extends below the lower margin of the image. The lung volumes are low and there are bibasilar opacities (right greater than left) which may reflect areas of atelectasis and/or consolidation, with superimposed moderate right-sided pleural effusion.  No evidence of pulmonary edema.  Heart size is normal. Mediastinal contours are within normal limits.  Atherosclerotic calcifications within the arch of the aorta.  IMPRESSION: 1.  Support apparatus, as above. 2.  Radiographic appearance of the chest is essentially unchanged, as detailed above.  Original Report Authenticated By: Florencia Reasons, M.D.       ASSESSMENT AND PLAN  PULMONARY  ETT:  5/14>>>5/19 >>> 5/21 Trach (DF) 5/22>>  5/14 CT chest>>b/l effusions and basilar ATX/consolidation, severe bullous lung disease with emphysema in upper lobes b/l.  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  Failed extubation, and s/p trach 5/22. Plan: -pressure support wean as tolerated>>not ready for trach collar yet -f/u CXR, monitor pleural effusions -continue schedule BD's   CARDIOVASCULAR  left [place] CVL 5/14>>> Rt femoral aline 5/14>>>out  Septic/hypovolemic shock 2nd to SBO complicated by wound dehisence>>resolved.  HTN, Edema, Intermittent tachycardia Plan: -continue IV lasix to keep in negative fluid balance -re-add metoprolol 5/23>>monitor for bradycardia -prn hydralazine   RENAL  Hypokalemia, hypernatremia resolved.   GASTROINTESTINAL  5/09 CT abd/pelvis>>SBO, RLQ transition point, small Rt inguinal hernia, diverticulosis 5/14 CT abd/pelvis>>ileus ABD Wound Vac 5/16>>  SBO s/p lap with SB resection 5/10 complicated by post-op ileus and fascial dehiscence, and return to OR 5/16. Plan: -post-op care per CCS -keep NPO and continue TNA per CCS   HEMATOLOGIC  Anemia of critical  illness. Plan: -f/u CBC -transfuse for Hb < 7   INFECTIOUS  Cultures: BCX2 5/14 >>1/2 gram neg rod>>> 1/2 Klebsiella  UC 5/14 >>Enterococcus (sensitive to ampicillin) Sputum 5/14>> Klebsiella pneumoniae (pan sensitive) Wound 5/14>>Klebsiella Pneumoniae (pan sensitive) & Enterobacter Cloacae (resistant to ancef, cefoxitin) BC X2 5/20>>> Sputum 5/22>>>  Antibiotics: vanc 5/14 >> 5/17 Zosyn 5/13 >>  Wound infection, aspiration pneumonia, UTI. Plan: -D11/14 zosyn   ENDOCRINE CBG (last 3)   Basename 03/09/12 0415 03/08/12 2356 03/08/12 2035  GLUCAP 117* 116* 122*   Hyperglycemia. Plan: -SSI  NEUROLOGIC  Acute metabolic encephalopathy 2nd to sepsis, respiratory failure, post-op pain, and ETOH.  Much improved.  Intolerant of lorazepam (does okay with midazolam). Plan: -continue fentanyl gtt for pain control -limit benzo's and haldol  Best practice/Disposition: -Protonix for SUP -Lovenox for DVT prophylaxis -HOB at 30 degrees -Full code  Critical care time 40 mintues  Coralyn Helling, MD Pager:  [phone] 03/09/2012, 10:39 AM

## 2012-03-09 NOTE — Op Note (Signed)
NAMEIZREAL, KOCK NO.:  [id number]  MEDICAL RECORD NO.:  [id number]  LOCATION:  2109                         FACILITY:  [facility]  PHYSICIAN:  Nelda Bucks, MD DATE OF BIRTH:  1946/04/25  DATE OF PROCEDURE:  03/08/2012 DATE OF DISCHARGE:                              OPERATIVE REPORT   The patient is currently located at 2[address] Intensive Care Unit.  PROCEDURE:  Percutaneous tracheostomy.  List of procedure was performed by myself with first assistant, Dr. Lonia Farber.  The patient was placed in a supine position.  Chlorhexidine preparation was used to sterilize the operative site.  PREOPERATIVE DIAGNOSES:  For the patient's chronic obstructive pulmonary disease, right lower lobe pneumonia from aspiration, abdominal distention secondary to recent small-bowel obstruction with abdominal distention contribution to respiratory failure, recurrent respiratory failure requiring intubation x3.  POSTOPERATIVE DIAGNOSIS:  Status post tracheostomy secondary to above respiratory failure etiologies.  The bronchoscopist placed the bronchoscope through the endotracheal tube and backed up to approximately 17 cm.  The patient required paralytics, therefore, respiratory rate was raised to 20 for the procedure.  After chlorhexidine preparation, I injected 7 mL of lidocaine with epinephrine into the operative site.  A 1.2 cm vertical incision was made over the second endotracheal space.  Dissection was made down to the fat pad and identified the strap muscles.  A white catheter sheath was placed over 18-gauge needle into the airway successfully without any posterior wall injury.  The white catheter sheath remained and with needle removed.  I then placed a wire through the white catheter sheath successfully. White catheter sheath was removed.  I then placed a 14-French punch dilator over the wire successfully in and out.  We performed a progressive Rhino  dilator to approximately 30-French over a glider, and this dilator was removed.  I then placed a size 28-French dilator through a size 8 Shiley tracheostomy over the glidewire successfully into the airway.  Everything was removed except for the tracheostomy. The balloon was inflated with air.  The tracheostomy was sutured in with 4-0 monofilament sutures.  The bronchoscope was then placed, the bronchoscope through the new tracheostomy, noted carina approximately 4.5 cm below without any posterior wall injury or concerns of complicating features.  Blood loss for the procedure was less than 1 mL. Postoperative chest x-ray revealed a well-placed tracheostomy.  The patient tolerated the procedure quite well.     Nelda Bucks, MD    DJF/MEDQ  D:  03/08/2012  T:  03/08/2012  Job:  [id number]

## 2012-03-09 NOTE — Progress Notes (Signed)
PARENTERAL NUTRITION CONSULT NOTE - FOLLOW UP   Pharmacy Consult for TPN Indication: Prolonged ileus s/p SBR for SBO 5/10  Allergies  Allergen Reactions  . Lorazepam Other (See Comments)    Severe agitated delirium. Tolerates midazolam and other benzo's    Patient Measurements: Height: 6\' 2"  (188 cm) Weight: 160 lb 4.4 oz (72.7 kg) IBW/kg (Calculated) : 82.2  Usual Weight: ~82 kg  Vital Signs: Temp: 98.2 F (36.8 C) (05/23 0756) Temp src: Oral (05/23 0756) BP: 124/77 mmHg (05/23 1000) Pulse Rate: 93  (05/23 1000) Intake/Output from previous day: 05/22 0701 - 05/23 0700 In: 3134.5 [I.V.:561.5; IV Piggyback:154; TPN:2419] Out: 3850 [Urine:2150; Emesis/NG output:1600; Drains:100] Intake/Output from this shift: Total I/O In: 408 [I.V.:83.5; IV Piggyback:14.5; TPN:310] Out: 315 [Urine:315]  Labs:  [facility] 03/08/12 0720 03/08/12 0447 03/07/12 0437  WBC -- 16.4* 27.7*  HGB -- 9.8* 12.4*  HCT -- 30.4* 39.4  PLT -- 293 315  APTT 40* -- --  INR 1.30 -- --     Basename 03/09/12 0500 03/08/12 0447 03/07/12 0437  NA 143 142 146*  K 3.9 4.0 4.6  CL 103 106 108  CO2 32 32 32  GLUCOSE 109* 107* 200*  BUN 27* 30* 24*  CREATININE 0.94 0.97 1.04  LABCREA -- -- --  CREAT24HRUR -- -- --  CALCIUM 8.6 8.0* 8.6  MG 2.2 -- --  PHOS 3.4 -- --  PROT 7.0 6.1 --  ALBUMIN 1.8* 1.6* --  AST 20 19 --  ALT 17 16 --  ALKPHOS 63 49 --  BILITOT 2.6* 1.4* --  BILIDIR -- -- --  IBILI -- -- --  PREALBUMIN -- -- --  TRIG -- 93 --  CHOLHDL -- -- --  CHOL -- -- --   Estimated Creatinine Clearance: 80.6 ml/min (by C-G formula based on Cr of 0.94).    Basename 03/09/12 0739 03/09/12 0415 03/08/12 2356  GLUCAP 124* 117* 116*    Insulin Requirements in the past 24 hours:  2 units  Nutritional Goals:  1850-1950 kCal, 105-120 grams of protein per day, 2-2.3L  Current Nutrition:  Clinimix E 5/20 at 100 ml/hr + lipids 59ml/hr M/W/F providing 1910 kcal; 120 gm protein providing  100% of kcal and 100% protein needs.   Assessment: 66 y.o. male on TPN for prolonged ileus s/p SBR for SBO on 5/10 at [facility]. Noted PMH of alcohol abuse. TPN initiated 03/01/12. Noted surgery plans to continue TPN for a few days with hopes to transition to tube feeds. Prealbumin remains <3 d/t stress of illness, adjusted formula to provide more protein.   GI: SBO s/p resection 5/10 at [facility], ileus on bowel rest. NPO. NGT trending up - 1688ml/24h. S/p OR 5/16 for debridement/abd closure. Noted two bowel movements 5/22 and positive flatus.  Endo: No h/o DM. CBGs well controlled   Lytes: K 3.9 (goal with ileus is 4). Other lytes wnl. Noted pt started on lasix 5/21.  Renal: SCr trending down, UOP increased to 1.2 ml/kg/hr Pulm: Reintubated (5/21), Trach placed 5/22 FiO2 40% Cards:  hx HTN. BP stable but slightly tachycardic. No pressors, IV metoprolol q8h and hydralazine PRN Hepatobil: AST/ALT ok, tbil elevated. Trig WNL, prealbumin remains low. Neuro: acute encephalopathy (ativan allergy); fent gtt ID: Zosyn [id number] for Kleb PNA, presumed aspiration/Kleb and Enterobacter in wound/20 kcol enterococcus in urine. Afebrile, WBC elevated at 16.4 but trending down. Best Practices:  Lovenox, PPI IV, MC  Plan:  - Discontinue SSI - Continue Clinimix E 5/15  at 140ml/hr - Give KCl IV x 2 runs  - Multivitamins, trace elements, and lipids on M/W/F due to national shortage. - Thiamine and folic acid daily in TPN - F/U BMET in the morning  - Continue to trend prealbumin  Thank you,  Brett Fairy, PharmD Pager: [phone]  03/09/2012 11:11 AM

## 2012-03-09 NOTE — Progress Notes (Signed)
SLP Cancellation Note  Continued daily f/u to determine readiness for PMSV assessment. Pt still working toward TC tolerance; currently remains on vent. Will continue to f/u.   Ferdinand Lango MA, CCC-SLP [phone]    Ferdinand Lango Meryl 03/09/2012, 9:38 AM

## 2012-03-09 NOTE — Progress Notes (Signed)
Clinical Social Worker phoned pt's dtr, [phone], and provided support.  Dtr expressed that she and her siblings have been able to be at the hospital each day and they were able to work together to help the pt.  CSW reviewed potential PT recommendations; dtr is open to each option (CIR, SNF, HH).  CSW to continue to follow and assist as needed.   Angelia Mould, MSW, [place] [phone]

## 2012-03-09 NOTE — Progress Notes (Signed)
Patient ID: Michael Ellison, male   DOB: 27-Feb-1946, 66 y.o.   MRN: [id number] [facility] Progress Note:   7 Days Post-Op  Subjective: Mental status is more alert.  Trached and animated but unable to speak Objective: Vital signs in last 24 hours: Temp:  [98.1 F (36.7 C)-98.9 F (37.2 C)] 98.2 F (36.8 C) (05/23 0756) Pulse Rate:  [74-108] 93  (05/23 1000) Resp:  [19-36] 31  (05/23 1000) BP: (88-155)/(62-99) 124/77 mmHg (05/23 1000) SpO2:  [86 %-100 %] 95 % (05/23 1000) FiO2 (%):  [40 %] 40 % (05/23 1000) Weight:  [160 lb 4.4 oz (72.7 kg)] 160 lb 4.4 oz (72.7 kg) (05/23 0500)  Intake/Output from previous day: 05/22 0701 - 05/23 0700 In: 3134.5 [I.V.:561.5; IV Piggyback:154; TPN:2419] Out: 3850 [Urine:2150; Emesis/NG output:1600; Drains:100] Intake/Output this shift: Total I/O In: 408 [I.V.:83.5; IV Piggyback:14.5; TPN:310] Out: 90 [Urine:90]  Physical Exam: Work of breathing is  Controlled.  Bilious drainage from NG.  Bloody drainage from [facility]  Lab Results:  Results for orders placed during the hospital encounter of 02/24/12 (from the past 48 hour(s))  GLUCOSE, CAPILLARY     Status: Abnormal   Collection Time   03/07/12 12:25 PM      Component Value Range Comment   Glucose-Capillary 125 (*) 70 - 99 (mg/dL)   PROCALCITONIN     Status: Normal   Collection Time   03/07/12 12:27 PM      Component Value Range Comment   Procalcitonin 3.70     LACTIC ACID, PLASMA     Status: Normal   Collection Time   03/07/12 12:30 PM      Component Value Range Comment   Lactic Acid, Venous 1.0  0.5 - 2.2 (mmol/L)   GLUCOSE, CAPILLARY     Status: Abnormal   Collection Time   03/07/12  4:08 PM      Component Value Range Comment   Glucose-Capillary 122 (*) 70 - 99 (mg/dL)   GLUCOSE, CAPILLARY     Status: Abnormal   Collection Time   03/07/12  7:46 PM      Component Value Range Comment   Glucose-Capillary 105 (*) 70 - 99 (mg/dL)   GLUCOSE, CAPILLARY     Status: Abnormal   Collection Time   03/07/12 11:49 PM      Component Value Range Comment   Glucose-Capillary 110 (*) 70 - 99 (mg/dL)   POCT I-STAT 3, BLOOD GAS (G3+)     Status: Abnormal   Collection Time   03/08/12  3:22 AM      Component Value Range Comment   pH, Arterial 7.386  7.350 - 7.450     pCO2 arterial 51.1 (*) 35.0 - 45.0 (mmHg)    pO2, Arterial 74.0 (*) 80.0 - 100.0 (mmHg)    Bicarbonate 30.6 (*) 20.0 - 24.0 (mEq/L)    TCO2 32  0 - 100 (mmol/L)    O2 Saturation 94.0      Acid-Base Excess 5.0 (*) 0.0 - 2.0 (mmol/L)    Patient temperature 98.6 F      Collection site RADIAL, ALLEN'S TEST ACCEPTABLE      Drawn by Operator      Sample type ARTERIAL     GLUCOSE, CAPILLARY     Status: Abnormal   Collection Time   03/08/12  3:25 AM      Component Value Range Comment   Glucose-Capillary 115 (*) 70 - 99 (mg/dL)   CBC     Status:  Abnormal   Collection Time   03/08/12  4:47 AM      Component Value Range Comment   WBC 16.4 (*) 4.0 - 10.5 (K/uL)    RBC 3.26 (*) 4.22 - 5.81 (MIL/uL)    Hemoglobin 9.8 (*) 13.0 - 17.0 (g/dL) DELTA CHECK NOTED   HCT 30.4 (*) 39.0 - 52.0 (%)    MCV 93.3  78.0 - 100.0 (fL)    MCH 30.1  26.0 - 34.0 (pg)    MCHC 32.2  30.0 - 36.0 (g/dL)    RDW 11.9  14.7 - 82.9 (%)    Platelets 293  150 - 400 (K/uL)   COMPREHENSIVE METABOLIC PANEL     Status: Abnormal   Collection Time   03/08/12  4:47 AM      Component Value Range Comment   Sodium 142  135 - 145 (mEq/L)    Potassium 4.0  3.5 - 5.1 (mEq/L)    Chloride 106  96 - 112 (mEq/L)    CO2 32  19 - 32 (mEq/L)    Glucose, Bld 107 (*) 70 - 99 (mg/dL)    BUN 30 (*) 6 - 23 (mg/dL)    Creatinine, Ser 5.62  0.50 - 1.35 (mg/dL)    Calcium 8.0 (*) 8.4 - 10.5 (mg/dL)    Total Protein 6.1  6.0 - 8.3 (g/dL)    Albumin 1.6 (*) 3.5 - 5.2 (g/dL)    AST 19  0 - 37 (U/L)    ALT 16  0 - 53 (U/L)    Alkaline Phosphatase 49  39 - 117 (U/L)    Total Bilirubin 1.4 (*) 0.3 - 1.2 (mg/dL)    GFR calc non Af Amer 85 (*) >90 (mL/min)    GFR calc  Af Amer >90  >90 (mL/min)   TRIGLYCERIDES     Status: Normal   Collection Time   03/08/12  4:47 AM      Component Value Range Comment   Triglycerides 93  <150 (mg/dL)   APTT     Status: Abnormal   Collection Time   03/08/12  7:20 AM      Component Value Range Comment   aPTT 40 (*) 24 - 37 (seconds)   PROTIME-INR     Status: Abnormal   Collection Time   03/08/12  7:20 AM      Component Value Range Comment   Prothrombin Time 16.4 (*) 11.6 - 15.2 (seconds)    INR 1.30  0.00 - 1.49    GLUCOSE, CAPILLARY     Status: Abnormal   Collection Time   03/08/12  7:25 AM      Component Value Range Comment   Glucose-Capillary 114 (*) 70 - 99 (mg/dL)   CULTURE, RESPIRATORY     Status: Normal (Preliminary result)   Collection Time   03/08/12 10:26 AM      Component Value Range Comment   Specimen Description TRACHEAL ASPIRATE      Special Requests NONE      Gram Stain        Value: MODERATE WBC PRESENT,BOTH PMN AND MONONUCLEAR     FEW SQUAMOUS EPITHELIAL CELLS PRESENT     NO ORGANISMS SEEN   Culture Culture reincubated for better growth      Report Status PENDING     GLUCOSE, CAPILLARY     Status: Abnormal   Collection Time   03/08/12 11:15 AM      Component Value Range Comment   Glucose-Capillary 117 (*) 70 -  99 (mg/dL)   GLUCOSE, CAPILLARY     Status: Normal   Collection Time   03/08/12  3:27 PM      Component Value Range Comment   Glucose-Capillary 94  70 - 99 (mg/dL)   GLUCOSE, CAPILLARY     Status: Abnormal   Collection Time   03/08/12  8:35 PM      Component Value Range Comment   Glucose-Capillary 122 (*) 70 - 99 (mg/dL)   GLUCOSE, CAPILLARY     Status: Abnormal   Collection Time   03/08/12 11:56 PM      Component Value Range Comment   Glucose-Capillary 116 (*) 70 - 99 (mg/dL)    Comment 1 Documented in Chart      Comment 2 Notify RN     GLUCOSE, CAPILLARY     Status: Abnormal   Collection Time   03/09/12  4:15 AM      Component Value Range Comment   Glucose-Capillary 117 (*) 70  - 99 (mg/dL)    Comment 1 Documented in Chart      Comment 2 Notify RN     COMPREHENSIVE METABOLIC PANEL     Status: Abnormal   Collection Time   03/09/12  5:00 AM      Component Value Range Comment   Sodium 143  135 - 145 (mEq/L)    Potassium 3.9  3.5 - 5.1 (mEq/L)    Chloride 103  96 - 112 (mEq/L)    CO2 32  19 - 32 (mEq/L)    Glucose, Bld 109 (*) 70 - 99 (mg/dL)    BUN 27 (*) 6 - 23 (mg/dL)    Creatinine, Ser 1.61  0.50 - 1.35 (mg/dL)    Calcium 8.6  8.4 - 10.5 (mg/dL)    Total Protein 7.0  6.0 - 8.3 (g/dL)    Albumin 1.8 (*) 3.5 - 5.2 (g/dL)    AST 20  0 - 37 (U/L)    ALT 17  0 - 53 (U/L)    Alkaline Phosphatase 63  39 - 117 (U/L)    Total Bilirubin 2.6 (*) 0.3 - 1.2 (mg/dL)    GFR calc non Af Amer 86 (*) >90 (mL/min)    GFR calc Af Amer >90  >90 (mL/min)   MAGNESIUM     Status: Normal   Collection Time   03/09/12  5:00 AM      Component Value Range Comment   Magnesium 2.2  1.5 - 2.5 (mg/dL)   PHOSPHORUS     Status: Normal   Collection Time   03/09/12  5:00 AM      Component Value Range Comment   Phosphorus 3.4  2.3 - 4.6 (mg/dL)     Radiology/Results: Dg Chest Port 1 View  03/09/2012  *RADIOLOGY REPORT*  Clinical Data: Tracheostomy tube  PORTABLE CHEST - 1 VIEW  Comparison: Mar 08, 2012  Findings: The tracheostomy is stable in good position.  The left subclavian central line tip is stable at the cavoatrial junction. The nasogastric tube tip courses off the inferior film.  Bibasilar airspace opacities and effusions, right greater than left, do not appear significantly changed.  Apical bullous disease is again noted.  IMPRESSION: Stable chest x-ray with bibasilar airspace opacities and small effusions,  right greater than left.  Original Report Authenticated By: Brandon Melnick, M.D.   Chest Portable 1 View To Assess Tube Placement And Rule-out Pneumothorax  03/08/2012  *RADIOLOGY REPORT*  Clinical Data: Status post tracheostomy.  PORTABLE CHEST -  1 VIEW  Comparison: Earlier  same date and 03/07/2012.  Findings: 1035 hours.  Interval tracheostomy.  Tracheostomy appears well positioned.  The left subclavian central line and nasogastric tube are unchanged in position.  There are stable pleural effusions and bibasilar air space opacities.  Apical bullous changes are again noted.  There is no evidence of pneumothorax.  IMPRESSION: Interval tracheostomy without demonstrated complication.  Stable pleural effusions and basilar air space opacities.  Original Report Authenticated By: Gerrianne Scale, M.D.   Dg Chest Port 1 View  03/08/2012  *RADIOLOGY REPORT*  Clinical Data: Evaluate endotracheal tube placement.  Shortness of breath.  PORTABLE CHEST - 1 VIEW  Comparison: Chest x-ray 03/07/2012.  Findings: An endotracheal tube is in place with tip 3.7 cm above the carina. There is a left-sided subclavian central venous catheter with tip terminating in the mid SVC. A nasogastric tube is seen extending into the stomach, however, the tip of the nasogastric tube extends below the lower margin of the image. The lung volumes are low and there are bibasilar opacities (right greater than left) which may reflect areas of atelectasis and/or consolidation, with superimposed moderate right-sided pleural effusion.  No evidence of pulmonary edema.  Heart size is normal. Mediastinal contours are within normal limits.  Atherosclerotic calcifications within the arch of the aorta.  IMPRESSION: 1.  Support apparatus, as above. 2.  Radiographic appearance of the chest is essentially unchanged, as detailed above.  Original Report Authenticated By: Florencia Reasons, M.D.    Anti-infectives: Anti-infectives     Start     Dose/Rate Route Frequency Ordered Stop   03/03/12 1800   vancomycin (VANCOCIN) 1,750 mg in sodium chloride 0.9 % 500 mL IVPB  Status:  Discontinued        1,750 mg 250 mL/hr over 120 Minutes Intravenous Every 12 hours 03/03/12 0628 03/03/12 0918   02/29/12 1900   micafungin (MYCAMINE)  100 mg in sodium chloride 0.9 % 100 mL IVPB  Status:  Discontinued        100 mg 100 mL/hr over 1 Hours Intravenous Every 24 hours 02/29/12 1754 03/01/12 0933   02/29/12 1730   vancomycin (VANCOCIN) 1,250 mg in sodium chloride 0.9 % 250 mL IVPB  Status:  Discontinued        1,250 mg 166.7 mL/hr over 90 Minutes Intravenous Every 12 hours 02/29/12 1644 03/03/12 0628   02/29/12 1715   vancomycin (VANCOCIN) IVPB 1000 mg/200 mL premix  Status:  Discontinued        1,000 mg 200 mL/hr over 60 Minutes Intravenous  Once 02/29/12 1637 02/29/12 1647   02/29/12 1645   piperacillin-tazobactam (ZOSYN) IVPB 3.375 g  Status:  Discontinued        3.375 g 100 mL/hr over 30 Minutes Intravenous  Once 02/29/12 1637 02/29/12 1641   02/28/12 1800   piperacillin-tazobactam (ZOSYN) IVPB 3.375 g  Status:  Discontinued        3.375 g 12.5 mL/hr over 240 Minutes Intravenous 4 times per day 02/28/12 1454 02/28/12 1510   02/28/12 1800  piperacillin-tazobactam (ZOSYN) IVPB 3.375 g       3.375 g 12.5 mL/hr over 240 Minutes Intravenous Every 8 hours 02/28/12 1510     02/25/12 1109   dextrose 5 % with cefOXitin (MEFOXIN) ADS Med     Comments: HOLLIE, CHRISTINA: cabinet override         02/25/12 1109 02/25/12 2314   02/25/12 1100   cefOXitin (MEFOXIN) 1 g in dextrose 5 %  50 mL IVPB  Status:  Discontinued        1 g 100 mL/hr over 30 Minutes Intravenous 60 min pre-op 02/25/12 1100 02/25/12 1542          Assessment/Plan: Problem List: Patient Active Problem List  Diagnoses  . Aspiration pneumonia  . Alcohol abuse  . Ileus following gastrointestinal surgery  . BP (high blood pressure)  . Sinus tachycardia  . Delirium, drug-induced  . Respiratory failure following trauma and surgery  . Agitation    Trach yesterday.  Still with ileus/copious NG drainage 7 Days Post-Op    LOS: 14 days   Matt B. Daphine Deutscher, MD, [facility] [phone] beeper [phone]  03/09/2012 10:30  AM

## 2012-03-09 NOTE — Progress Notes (Signed)
AT 2005, the pt had a 21 beat run of SVT. Pt was asymptomatic and spontaneously converted back to NSR.

## 2012-03-09 NOTE — Progress Notes (Signed)
ANTIBIOTIC CONSULT NOTE - FOLLOW UP  Pharmacy Consult for Zosyn Indication: Klebsiella PNA and Klebsiella + Enterobacter wound infection  Allergies  Allergen Reactions  . Lorazepam Other (See Comments)    Severe agitated delirium. Tolerates midazolam and other benzo's    Patient Measurements: Height: 6\' 2"  (188 cm) Weight: 160 lb 4.4 oz (72.7 kg) IBW/kg (Calculated) : 82.2   Vital Signs: Temp: 98.2 F (36.8 C) (05/23 0756) Temp src: Oral (05/23 0756) BP: 148/85 mmHg (05/23 1200) Pulse Rate: 96  (05/23 1200) Intake/Output from previous day: 05/22 0701 - 05/23 0700 In: 3134.5 [I.V.:561.5; IV Piggyback:154; TPN:2419] Out: 3850 [Urine:2150; Emesis/NG output:1600; Drains:100] Intake/Output from this shift: Total I/O In: 708 [I.V.:118.5; IV Piggyback:39.5; TPN:550] Out: 1115 [Urine:1115]  Labs:  [facility] 03/09/12 0500 03/08/12 0447 03/07/12 0437  WBC -- 16.4* 27.7*  HGB -- 9.8* 12.4*  PLT -- 293 315  LABCREA -- -- --  CREATININE 0.94 0.97 1.04   Estimated Creatinine Clearance: 80.6 ml/min (by C-G formula based on Cr of 0.94). No results found for this basename: VANCOTROUGH:2,VANCOPEAK:2,VANCORANDOM:2,GENTTROUGH:2,GENTPEAK:2,GENTRANDOM:2,TOBRATROUGH:2,TOBRAPEAK:2,TOBRARND:2,AMIKACINPEAK:2,AMIKACINTROU:2,AMIKACIN:2, in the last 72 hours   Assessment: 87 YOM with h/o ETOH abuse who was tx [facility] for respiratory failure s/p exlap for SBO on 5/10.   Zosyn continues for PNA, wound infection and UTI. Patient is afebrile, WBC WNL. PCT down from 52 on admit to 3.7 on 5/21. Scr is stable, UOP good (1.77ml/kg/hr).  Zosyn 5/13 >>(day 11) Vanc 5/14 >>5/17 Mycamine 5/14 >> 5/15  5/14 abd incision site -mod Kleb Pneumo (pan sens), mod Enterobacter Clo. (pan sens except R ancef) 5/14 resp cx - few Kleb Pneumo (pan sens) 5/14 blood cx - 1/2 klebsiella (pan sens) 5/14 Urine: 20k Enterococcus (pan sens) 5/14 MRSA PCR: neg 5/22 Resp: ngtd 5/20 Blood: ngtd  Goal of Therapy:    Clinical Improvement  Plan:  - Continue Zosyn 3.375g IV q8h- 4hr infusion.  - Consider d/c on/by 5/27 (completes 14 day course)  Coutney Wildermuth K. Allena Katz, PharmD, BCPS.  Clinical Pharmacist Pager [phone]. 03/09/2012 12:59 PM

## 2012-03-10 ENCOUNTER — Inpatient Hospital Stay (HOSPITAL_COMMUNITY): Payer: Medicare Other

## 2012-03-10 LAB — GLUCOSE, CAPILLARY
Glucose-Capillary: 111 mg/dL — ABNORMAL HIGH (ref 70–99)
Glucose-Capillary: 121 mg/dL — ABNORMAL HIGH (ref 70–99)
Glucose-Capillary: 122 mg/dL — ABNORMAL HIGH (ref 70–99)
Glucose-Capillary: 140 mg/dL — ABNORMAL HIGH (ref 70–99)

## 2012-03-10 LAB — BASIC METABOLIC PANEL
Calcium: 8.8 mg/dL (ref 8.4–10.5)
GFR calc non Af Amer: 89 mL/min — ABNORMAL LOW (ref >90)
Potassium: 4.1 mEq/L (ref 3.5–5.1)
Sodium: 140 mEq/L (ref 135–145)

## 2012-03-10 LAB — CBC
Hemoglobin: 10.5 g/dL — ABNORMAL LOW (ref 13.0–17.0)
Platelets: 421 10*3/uL — ABNORMAL HIGH (ref 150–400)
RBC: 3.56 MIL/uL — ABNORMAL LOW (ref 4.22–5.81)
WBC: 14.3 10*3/uL — ABNORMAL HIGH (ref 4.0–10.5)

## 2012-03-10 MED ORDER — DIPHENHYDRAMINE HCL 50 MG/ML IJ SOLN: 12.5000 mg | Freq: Four times a day (QID) | INTRAMUSCULAR | Status: DC | PRN

## 2012-03-10 MED ORDER — NALOXONE HCL 0.4 MG/ML IJ SOLN: 0.4000 mg | INTRAMUSCULAR | Status: DC | PRN

## 2012-03-10 MED ORDER — FENTANYL 10 MCG/ML IV SOLN
INTRAVENOUS | Status: DC
  Administered 2012-03-10: 135 ug via INTRAVENOUS
  Administered 2012-03-10: 195 ug via INTRAVENOUS
  Administered 2012-03-10: 30 ug via INTRAVENOUS
  Administered 2012-03-11: 260 ug via INTRAVENOUS
  Administered 2012-03-11: 150 ug via INTRAVENOUS
  Administered 2012-03-11: 95 ug via INTRAVENOUS
  Administered 2012-03-11: 105 ug via INTRAVENOUS
  Administered 2012-03-11: 330 ug via INTRAVENOUS
  Administered 2012-03-12: 75 ug via INTRAVENOUS
  Administered 2012-03-12: 30 ug via INTRAVENOUS
  Administered 2012-03-12: 70 ug via INTRAVENOUS
  Administered 2012-03-12: 60 ug via INTRAVENOUS
  Administered 2012-03-12: 75 ug via INTRAVENOUS
  Administered 2012-03-12: 30 ug via INTRAVENOUS
  Administered 2012-03-13: 10:00:00 via INTRAVENOUS
  Administered 2012-03-13 (×2): 60 ug via INTRAVENOUS
  Administered 2012-03-13: 75 ug via INTRAVENOUS
  Administered 2012-03-13: 390 ug via INTRAVENOUS
  Administered 2012-03-13: 19:00:00 via INTRAVENOUS
  Administered 2012-03-14: 240 ug via INTRAVENOUS
  Administered 2012-03-14: 315 ug via INTRAVENOUS
  Administered 2012-03-14: 120 ug via INTRAVENOUS
  Administered 2012-03-14: 19:00:00 via INTRAVENOUS
  Administered 2012-03-14: 180 ug via INTRAVENOUS
  Administered 2012-03-14: 150 ug via INTRAVENOUS
  Administered 2012-03-15: 225 ug via INTRAVENOUS
  Administered 2012-03-15: 30 ug via INTRAVENOUS
  Administered 2012-03-15: 135 ug via INTRAVENOUS
  Filled 2012-03-10 (×8): qty 50

## 2012-03-10 MED ORDER — FAT EMULSION 20 % IV EMUL
250.0000 mL | INTRAVENOUS | Status: AC
  Administered 2012-03-10: 250 mL via INTRAVENOUS
  Filled 2012-03-10: qty 250

## 2012-03-10 MED ORDER — SODIUM CHLORIDE 0.9 % IJ SOLN: 9.0000 mL | INTRAMUSCULAR | Status: DC | PRN

## 2012-03-10 MED ORDER — ONDANSETRON HCL 4 MG/2ML IJ SOLN: 4.0000 mg | Freq: Four times a day (QID) | INTRAMUSCULAR | Status: DC | PRN

## 2012-03-10 MED ORDER — FUROSEMIDE 10 MG/ML IJ SOLN
40.0000 mg | Freq: Once | INTRAMUSCULAR | Status: AC
  Administered 2012-03-10: 40 mg via INTRAVENOUS
  Filled 2012-03-10: qty 4

## 2012-03-10 MED ORDER — TRACE MINERALS CR-CU-MN-SE-ZN 10-1000-500-60 MCG/ML IV SOLN
INTRAVENOUS | Status: AC
  Administered 2012-03-10: 18:00:00 via INTRAVENOUS
  Filled 2012-03-10: qty 2400

## 2012-03-10 MED ORDER — DIPHENHYDRAMINE HCL 12.5 MG/5ML PO ELIX
12.5000 mg | ORAL_SOLUTION | Freq: Four times a day (QID) | ORAL | Status: DC | PRN
  Filled 2012-03-10: qty 5

## 2012-03-10 NOTE — Evaluation (Signed)
Occupational Therapy Evaluation Patient Details Name: Michael Ellison MRN: [id number] DOB: [birth year] Today's Date: 03/10/2012 Time: 4696-2952 OT Time Calculation (min): 24 min  OT Assessment / Plan / Recommendation Clinical Impression  Pt is a 66 yo male who presents with recent VDRF with several respiratory complications. Skilled OT recommended to maximize I w/BADLs to supervision/min A level in prep for d/c to next venue of care.    OT Assessment  Patient needs continued OT Services    Follow Up Recommendations  LTACH (If pt agreeable)    Barriers to Discharge Inaccessible home environment;Decreased caregiver support    Equipment Recommendations  Other (comment) (TBD)    Recommendations for Other Services    Frequency  Min 2X/week    Precautions / Restrictions Precautions Precautions: Fall Precaution Comments: trach with vent Restrictions Weight Bearing Restrictions: No   Pertinent Vitals/Pain     ADL  Grooming: Performed;Wash/dry face Where Assessed - Grooming: Supine, head of bed up Upper Body Bathing: Simulated;Minimal assistance Where Assessed - Upper Body Bathing: Unsupported sitting Lower Body Bathing: Simulated;+1 Total assistance Where Assessed - Lower Body Bathing: Supported sit to stand Upper Body Dressing: Simulated;Minimal assistance Where Assessed - Upper Body Dressing: Unsupported sitting Lower Body Dressing: Simulated;+1 Total assistance Where Assessed - Lower Body Dressing: Sopported sit to stand Toilet Transfer: Simulated;+2 Total assistance Toilet Transfer: Patient Percentage: 70% Toilet Transfer Method: Stand pivot Toileting - Clothing Manipulation and Hygiene: Simulated;Minimal assistance Where Assessed - Glass blower/designer Manipulation and Hygiene: Standing ADL Comments: Pt limited by poor activity tolerance.    OT Diagnosis: Generalized weakness  OT Problem List: Decreased activity tolerance;Decreased safety awareness;Decreased knowledge of  use of DME or AE;Impaired balance (sitting and/or standing);Cardiopulmonary status limiting activity OT Treatment Interventions: Self-care/ADL training;Therapeutic activities;Balance training;Patient/family education;DME and/or AE instruction;Energy conservation   OT Goals Acute Rehab OT Goals OT Goal Formulation: With patient Time For Goal Achievement: 03/24/12 Potential to Achieve Goals: Good ADL Goals Pt Will Perform Grooming: with supervision;Standing at sink;Unsupported (X 2 tasks) ADL Goal: Grooming - Progress: Goal set today Pt Will Perform Upper Body Bathing: with set-up;Sitting, edge of bed;Sitting, chair;Unsupported ADL Goal: Upper Body Bathing - Progress: Goal set today Pt Will Perform Lower Body Bathing: with min assist;Sit to stand from chair;Sit to stand from bed;with adaptive equipment ADL Goal: Lower Body Bathing - Progress: Goal set today Pt Will Perform Upper Body Dressing: with set-up;Sitting, chair;Sitting, bed;Unsupported ADL Goal: Upper Body Dressing - Progress: Goal set today Pt Will Perform Lower Body Dressing: with min assist;Sit to stand from chair;Sit to stand from bed;with adaptive equipment ADL Goal: Lower Body Dressing - Progress: Goal set today Pt Will Transfer to Toilet: with supervision;Ambulation;Stand pivot transfer;3-in-1;Regular height toilet ADL Goal: Toilet Transfer - Progress: Goal set today Pt Will Perform Toileting - Clothing Manipulation: with supervision ADL Goal: Toileting - Clothing Manipulation - Progress: Goal set today Pt Will Perform Toileting - Hygiene: Sit to stand from 3-in-1/toilet;with supervision ADL Goal: Toileting - Hygiene - Progress: Goal set today Miscellaneous OT Goals Miscellaneous OT Goal #1: Pt will verbalize 3 energy conservation strategies for successful ADL completion. OT Goal: Miscellaneous Goal #1 - Progress: Goal set today  Visit Information  Assistance Needed: +2    Subjective Data  Subjective: Pt unable to  vocalize due to trach. Does nod yes and no, mouths words and has a simple communication board. Patient Stated Goal: to go home   Prior Functioning  Home Living Lives With: Alone Type of Home: House Home Access: Level entry  Home Layout: One level Bathroom Shower/Tub: Engineer, manufacturing systems: Standard Home Adaptive Equipment: Straight cane Prior Function Level of Independence: Independent Driving: No Vocation: Retired Musician: Tracheostomy Dominant Hand: Right    Cognition  Overall Cognitive Status: Appears within functional limits for tasks assessed/performed Difficult to assess due to: Tracheostomy Arousal/Alertness: Awake/alert Orientation Level: Appears intact for tasks assessed Behavior During Session: [facility] for tasks performed    Extremity/Trunk Assessment Right Upper Extremity Assessment RUE ROM/Strength/Tone: Within functional levels Left Upper Extremity Assessment LUE ROM/Strength/Tone: Within functional levels Right Lower Extremity Assessment RLE ROM/Strength/Tone: Within functional levels RLE Sensation: WFL - Light Touch RLE Coordination: WFL - gross/fine motor Left Lower Extremity Assessment LLE ROM/Strength/Tone: Within functional levels LLE Sensation: WFL - Light Touch LLE Coordination: WFL - gross/fine motor Trunk Assessment Trunk Assessment: Normal   Mobility Bed Mobility Bed Mobility: Rolling Right;Right Sidelying to Sit;Sitting - Scoot to Edge of Bed Rolling Right: 4: Min assist;With rail Right Sidelying to Sit: 4: Min assist;With rails;HOB elevated (20 degrees) Sitting - Scoot to Edge of Bed: 4: Min guard;With rail Details for Bed Mobility Assistance: Patient needed a little assist more for lines than actually physical assist.   Transfers Sit to Stand: 1: +2 Total assist;From elevated surface;With upper extremity assist;From bed Sit to Stand: Patient Percentage: 70% Stand to Sit: 1: +2 Total assist;With upper extremity  assist;With armrests;To chair/3-in-1 Stand to Sit: Patient Percentage: 70% Details for Transfer Assistance: Patient needed cues for hand placement.  Patient able to stand fully upright and take pivotal steps around to chair.     Exercise General Exercises - Lower Extremity Long Arc Quad: AROM;Both;10 reps;Seated  Balance Balance Balance Assessed: Yes Static Sitting Balance Static Sitting - Balance Support: No upper extremity supported;Feet supported Static Sitting - Level of Assistance: 5: Stand by assistance Static Sitting - Comment/# of Minutes: 3  End of Session OT - End of Session Equipment Utilized During Treatment: Gait belt Activity Tolerance: Patient limited by fatigue Patient left: in chair;with call bell/phone within reach Nurse Communication: Mobility status   Chavez Rosol A OTR/L [phone] 03/10/2012, 11:16 AM

## 2012-03-10 NOTE — Progress Notes (Signed)
Patient ID: Michael Ellison, male   DOB: 1946-02-13, 66 y.o.   MRN: [id number] [facility] Progress Note:   8 Days Post-Op  Subjective: Mental status is alert and communicating with sign language Objective: Vital signs in last 24 hours: Temp:  [97.8 F (36.6 C)-98.4 F (36.9 C)] 97.8 F (36.6 C) (05/24 0800) Pulse Rate:  [83-102] 85  (05/24 0835) Resp:  [20-35] 30  (05/24 0835) BP: (97-148)/(67-91) 122/77 mmHg (05/24 0835) SpO2:  [92 %-99 %] 96 % (05/24 0835) FiO2 (%):  [40 %] 40 % (05/24 0835)  Intake/Output from previous day: 05/23 0701 - 05/24 0700 In: 3753 [P.O.:400; I.V.:501; IV Piggyback:252; TPN:2600] Out: 3890 [Urine:3040; Emesis/NG output:775; Drains:75] Intake/Output this shift: Total I/O In: -  Out: 175 [Urine:175]  Physical Exam: Work of breathing is  Controlled by vent.  On trach.  Wound VAC in place.  NG  Appears rather static in clinical improvement.    Lab Results:  Results for orders placed during the hospital encounter of 02/24/12 (from the past 48 hour(s))  CULTURE, RESPIRATORY     Status: Normal (Preliminary result)   Collection Time   03/08/12 10:26 AM      Component Value Range Comment   Specimen Description TRACHEAL ASPIRATE      Special Requests NONE      Gram Stain        Value: MODERATE WBC PRESENT,BOTH PMN AND MONONUCLEAR     FEW SQUAMOUS EPITHELIAL CELLS PRESENT     NO ORGANISMS SEEN   Culture Culture reincubated for better growth      Report Status PENDING     GLUCOSE, CAPILLARY     Status: Abnormal   Collection Time   03/08/12 11:15 AM      Component Value Range Comment   Glucose-Capillary 117 (*) 70 - 99 (mg/dL)   GLUCOSE, CAPILLARY     Status: Normal   Collection Time   03/08/12  3:27 PM      Component Value Range Comment   Glucose-Capillary 94  70 - 99 (mg/dL)   GLUCOSE, CAPILLARY     Status: Abnormal   Collection Time   03/08/12  8:35 PM      Component Value Range Comment   Glucose-Capillary 122 (*) 70 - 99 (mg/dL)     GLUCOSE, CAPILLARY     Status: Abnormal   Collection Time   03/08/12 11:56 PM      Component Value Range Comment   Glucose-Capillary 116 (*) 70 - 99 (mg/dL)    Comment 1 Documented in Chart      Comment 2 Notify RN     GLUCOSE, CAPILLARY     Status: Abnormal   Collection Time   03/09/12  4:15 AM      Component Value Range Comment   Glucose-Capillary 117 (*) 70 - 99 (mg/dL)    Comment 1 Documented in Chart      Comment 2 Notify RN     COMPREHENSIVE METABOLIC PANEL     Status: Abnormal   Collection Time   03/09/12  5:00 AM      Component Value Range Comment   Sodium 143  135 - 145 (mEq/L)    Potassium 3.9  3.5 - 5.1 (mEq/L)    Chloride 103  96 - 112 (mEq/L)    CO2 32  19 - 32 (mEq/L)    Glucose, Bld 109 (*) 70 - 99 (mg/dL)    BUN 27 (*) 6 - 23 (mg/dL)  Creatinine, Ser 0.94  0.50 - 1.35 (mg/dL)    Calcium 8.6  8.4 - 10.5 (mg/dL)    Total Protein 7.0  6.0 - 8.3 (g/dL)    Albumin 1.8 (*) 3.5 - 5.2 (g/dL)    AST 20  0 - 37 (U/L)    ALT 17  0 - 53 (U/L)    Alkaline Phosphatase 63  39 - 117 (U/L)    Total Bilirubin 2.6 (*) 0.3 - 1.2 (mg/dL)    GFR calc non Af Amer 86 (*) >90 (mL/min)    GFR calc Af Amer >90  >90 (mL/min)   MAGNESIUM     Status: Normal   Collection Time   03/09/12  5:00 AM      Component Value Range Comment   Magnesium 2.2  1.5 - 2.5 (mg/dL)   PHOSPHORUS     Status: Normal   Collection Time   03/09/12  5:00 AM      Component Value Range Comment   Phosphorus 3.4  2.3 - 4.6 (mg/dL)   GLUCOSE, CAPILLARY     Status: Abnormal   Collection Time   03/09/12  7:39 AM      Component Value Range Comment   Glucose-Capillary 124 (*) 70 - 99 (mg/dL)   GLUCOSE, CAPILLARY     Status: Abnormal   Collection Time   03/09/12 11:21 AM      Component Value Range Comment   Glucose-Capillary 135 (*) 70 - 99 (mg/dL)   GLUCOSE, CAPILLARY     Status: Abnormal   Collection Time   03/09/12  3:53 PM      Component Value Range Comment   Glucose-Capillary 129 (*) 70 - 99 (mg/dL)    GLUCOSE, CAPILLARY     Status: Abnormal   Collection Time   03/09/12  8:08 PM      Component Value Range Comment   Glucose-Capillary 141 (*) 70 - 99 (mg/dL)   GLUCOSE, CAPILLARY     Status: Abnormal   Collection Time   03/10/12 12:14 AM      Component Value Range Comment   Glucose-Capillary 122 (*) 70 - 99 (mg/dL)    Comment 1 Documented in Chart      Comment 2 Notify RN     GLUCOSE, CAPILLARY     Status: Abnormal   Collection Time   03/10/12  4:22 AM      Component Value Range Comment   Glucose-Capillary 140 (*) 70 - 99 (mg/dL)    Comment 1 Documented in Chart      Comment 2 Notify RN     BASIC METABOLIC PANEL     Status: Abnormal   Collection Time   03/10/12  5:00 AM      Component Value Range Comment   Sodium 140  135 - 145 (mEq/L)    Potassium 4.1  3.5 - 5.1 (mEq/L)    Chloride 101  96 - 112 (mEq/L)    CO2 33 (*) 19 - 32 (mEq/L)    Glucose, Bld 163 (*) 70 - 99 (mg/dL)    BUN 28 (*) 6 - 23 (mg/dL)    Creatinine, Ser 1.61  0.50 - 1.35 (mg/dL)    Calcium 8.8  8.4 - 10.5 (mg/dL)    GFR calc non Af Amer 89 (*) >90 (mL/min)    GFR calc Af Amer >90  >90 (mL/min)   CBC     Status: Abnormal   Collection Time   03/10/12  5:00 AM  Component Value Range Comment   WBC 14.3 (*) 4.0 - 10.5 (K/uL)    RBC 3.56 (*) 4.22 - 5.81 (MIL/uL)    Hemoglobin 10.5 (*) 13.0 - 17.0 (g/dL)    HCT 45.4 (*) 09.8 - 52.0 (%)    MCV 93.5  78.0 - 100.0 (fL)    MCH 29.5  26.0 - 34.0 (pg)    MCHC 31.5  30.0 - 36.0 (g/dL)    RDW 11.9  14.7 - 82.9 (%)    Platelets 421 (*) 150 - 400 (K/uL)     Radiology/Results: Dg Chest Port 1 View  03/10/2012  *RADIOLOGY REPORT*  Clinical Data: Cough and pneumonia.  PORTABLE CHEST - 1 VIEW  Comparison: Chest x-ray 03/09/2012.  Findings: A tracheostomy tube is in place with tip 6.0 cm above the carina. There is a left-sided subclavian central venous catheter with tip terminating in the proximal superior vena cava. A nasogastric tube is seen extending into the stomach,  however, the tip of the nasogastric tube extends below the lower margin of the image.  Lung volumes are low.  There continue to be bibasilar opacities (right greater than left), which may reflect underlying atelectasis and/or consolidation.  There is a moderate right-sided pleural effusion in the right hemithorax which has slightly decreased compared to the prior study.  No definite left pleural effusion.  No evidence of pulmonary edema.  Heart size is normal. The patient is rotated to the left on today's exam, resulting in distortion of the mediastinal contours and reduced diagnostic sensitivity and specificity for mediastinal pathology. Atherosclerotic calcifications within the arch of the aorta.  IMPRESSION: 1.  Support apparatus, as above. 2.  Persistent low lung volumes with bibasilar opacities that may reflect areas of atelectasis and/or consolidation with superimposed moderate right-sided pleural effusion (which has slightly decreased).  Original Report Authenticated By: Florencia Reasons, M.D.   Dg Chest Port 1 View  03/09/2012  *RADIOLOGY REPORT*  Clinical Data: Tracheostomy tube  PORTABLE CHEST - 1 VIEW  Comparison: Mar 08, 2012  Findings: The tracheostomy is stable in good position.  The left subclavian central line tip is stable at the cavoatrial junction. The nasogastric tube tip courses off the inferior film.  Bibasilar airspace opacities and effusions, right greater than left, do not appear significantly changed.  Apical bullous disease is again noted.  IMPRESSION: Stable chest x-ray with bibasilar airspace opacities and small effusions,  right greater than left.  Original Report Authenticated By: Brandon Melnick, M.D.   Chest Portable 1 View To Assess Tube Placement And Rule-out Pneumothorax  03/08/2012  *RADIOLOGY REPORT*  Clinical Data: Status post tracheostomy.  PORTABLE CHEST - 1 VIEW  Comparison: Earlier same date and 03/07/2012.  Findings: 1035 hours.  Interval tracheostomy.  Tracheostomy  appears well positioned.  The left subclavian central line and nasogastric tube are unchanged in position.  There are stable pleural effusions and bibasilar air space opacities.  Apical bullous changes are again noted.  There is no evidence of pneumothorax.  IMPRESSION: Interval tracheostomy without demonstrated complication.  Stable pleural effusions and basilar air space opacities.  Original Report Authenticated By: Gerrianne Scale, M.D.    Anti-infectives: Anti-infectives     Start     Dose/Rate Route Frequency Ordered Stop   03/03/12 1800   vancomycin (VANCOCIN) 1,750 mg in sodium chloride 0.9 % 500 mL IVPB  Status:  Discontinued        1,750 mg 250 mL/hr over 120 Minutes Intravenous Every 12 hours  03/03/12 0628 03/03/12 0918   02/29/12 1900   micafungin (MYCAMINE) 100 mg in sodium chloride 0.9 % 100 mL IVPB  Status:  Discontinued        100 mg 100 mL/hr over 1 Hours Intravenous Every 24 hours 02/29/12 1754 03/01/12 0933   02/29/12 1730   vancomycin (VANCOCIN) 1,250 mg in sodium chloride 0.9 % 250 mL IVPB  Status:  Discontinued        1,250 mg 166.7 mL/hr over 90 Minutes Intravenous Every 12 hours 02/29/12 1644 03/03/12 0628   02/29/12 1715   vancomycin (VANCOCIN) IVPB 1000 mg/200 mL premix  Status:  Discontinued        1,000 mg 200 mL/hr over 60 Minutes Intravenous  Once 02/29/12 1637 02/29/12 1647   02/29/12 1645   piperacillin-tazobactam (ZOSYN) IVPB 3.375 g  Status:  Discontinued        3.375 g 100 mL/hr over 30 Minutes Intravenous  Once 02/29/12 1637 02/29/12 1641   02/28/12 1800   piperacillin-tazobactam (ZOSYN) IVPB 3.375 g  Status:  Discontinued        3.375 g 12.5 mL/hr over 240 Minutes Intravenous 4 times per day 02/28/12 1454 02/28/12 1510   02/28/12 1800  piperacillin-tazobactam (ZOSYN) IVPB 3.375 g       3.375 g 12.5 mL/hr over 240 Minutes Intravenous Every 8 hours 02/28/12 1510     02/25/12 1109   dextrose 5 % with cefOXitin (MEFOXIN) ADS Med     Comments:  HOLLIE, CHRISTINA: cabinet override         02/25/12 1109 02/25/12 2314   02/25/12 1100   cefOXitin (MEFOXIN) 1 g in dextrose 5 % 50 mL IVPB  Status:  Discontinued        1 g 100 mL/hr over 30 Minutes Intravenous 60 min pre-op 02/25/12 1100 02/25/12 1542          Assessment/Plan: Problem List: Patient Active Problem List  Diagnoses  . Aspiration pneumonia  . Alcohol abuse  . Ileus following gastrointestinal surgery  . BP (high blood pressure)  . Sinus tachycardia  . Delirium, drug-induced  . Respiratory failure following trauma and surgery  . Agitation    No surgical recommendations at present 8 Days Post-Op    LOS: 15 days   Matt B. Daphine Deutscher, MD, [facility] [phone] beeper [phone]  03/10/2012 8:54 AM

## 2012-03-10 NOTE — Progress Notes (Signed)
eLink Physician-Brief Progress Note Patient Name: Michael Ellison DOB: 04-16-1946 MRN: [id number]  Date of Service  03/10/2012   HPI/Events of Note   On arrival to 2600, pt in resp distress and copious secretions   eICU Interventions  Obtain CXR and give lasix  Place on full vent support   Intervention Category Major Interventions: Respiratory failure - evaluation and management  Shan Levans 03/10/2012, 6:42 PM

## 2012-03-10 NOTE — Progress Notes (Signed)
Pt to wean 5/5 then attempt ATC. RT will continue to monitor.

## 2012-03-10 NOTE — Evaluation (Signed)
Physical Therapy Evaluation Patient Details Name: Michael Ellison MRN: [id number] DOB: 08-12-1946 Today's Date: 03/10/2012 Time: 4696-2952 PT Time Calculation (min): 23 min  PT Assessment / Plan / Recommendation Clinical Impression  Patient s/p VDRF with trach with decr mobility secondary to immobility for some time while patient has had multiple complications with respiratory status.  Continue PT.  Patient may benefit from [facility] referral.      PT Assessment  Patient needs continued PT services    Follow Up Recommendations  LTACH    Barriers to Discharge Decreased caregiver support      lEquipment Recommendations   (TBA)    Recommendations for Other Services   None  Frequency Min 3X/week    Precautions / Restrictions Precautions Precautions: Fall Precaution Comments: trach with vent Restrictions Weight Bearing Restrictions: No   Pertinent Vitals/Pain VSS/ No pain      Mobility  Bed Mobility Bed Mobility: Rolling Right;Right Sidelying to Sit;Sitting - Scoot to Edge of Bed Rolling Right: 4: Min assist;With rail Right Sidelying to Sit: 4: Min assist;With rails;HOB elevated (20 degrees) Sitting - Scoot to Edge of Bed: 4: Min guard;With rail Details for Bed Mobility Assistance: Patient needed a little assist more for lines than actually physical assist.   Transfers Transfers: Sit to Stand;Stand to Sit;Stand Pivot Transfers Sit to Stand: 1: +2 Total assist;From elevated surface;With upper extremity assist;From bed Sit to Stand: Patient Percentage: 70% Stand to Sit: 1: +2 Total assist;With upper extremity assist;With armrests;To chair/3-in-1 Stand to Sit: Patient Percentage: 70% Stand Pivot Transfers: 1: +2 Total assist Stand Pivot Transfers: Patient Percentage: 70% Details for Transfer Assistance: Patient needed cues for hand placement.  Patient able to stand fully upright and take pivotal steps around to chair.   Ambulation/Gait Ambulation/Gait Assistance: Not tested  (comment) Stairs: No Wheelchair Mobility Wheelchair Mobility: No    Exercises General Exercises - Lower Extremity Long Arc Quad: AROM;Both;10 reps;Seated   PT Diagnosis: Generalized weakness  PT Problem List: Decreased activity tolerance;Decreased balance;Decreased mobility;Decreased knowledge of use of DME;Decreased safety awareness PT Treatment Interventions: DME instruction;Gait training;Functional mobility training;Therapeutic activities;Therapeutic exercise;Balance training;Patient/family education   PT Goals Acute Rehab PT Goals PT Goal Formulation: With patient Time For Goal Achievement: 03/24/12 Potential to Achieve Goals: Good Pt will go Supine/Side to Sit: Independently PT Goal: Supine/Side to Sit - Progress: Goal set today Pt will Sit at Edge of Bed: Independently PT Goal: Sit at Edge Of Bed - Progress: Goal set today Pt will go Sit to Stand: Independently PT Goal: Sit to Stand - Progress: Goal set today Pt will go Stand to Sit: Independently PT Goal: Stand to Sit - Progress: Goal set today Pt will Transfer Bed to Chair/Chair to Bed: with modified independence PT Transfer Goal: Bed to Chair/Chair to Bed - Progress: Goal set today Pt will Ambulate: 51 - 150 feet;with least restrictive assistive device;with supervision PT Goal: Ambulate - Progress: Goal set today  Visit Information  Last PT Received On: 03/10/12 Assistance Needed: +2 PT/OT Co-Evaluation/Treatment: Yes    Subjective Data  Subjective: Patient with trach on vent.  Nods head to yes/no questions.  Appears to have reliability. Patient Stated Goal: To go home   Prior Functioning  Home Living Lives With: Alone Type of Home: House Home Access: Level entry Home Layout: One level Bathroom Shower/Tub: Engineer, manufacturing systems: Standard Home Adaptive Equipment: Straight cane Prior Function Level of Independence: Independent Driving: No Vocation: Retired Musician: Tracheostomy     Cognition  Overall Cognitive  Status: Appears within functional limits for tasks assessed/performed Difficult to assess due to: Tracheostomy Arousal/Alertness: Awake/alert Orientation Level: Appears intact for tasks assessed Behavior During Session: [facility] for tasks performed    Extremity/Trunk Assessment Right Upper Extremity Assessment RUE ROM/Strength/Tone:  (See OT note) Left Upper Extremity Assessment LUE ROM/Strength/Tone:  (see OT note) Right Lower Extremity Assessment RLE ROM/Strength/Tone: Within functional levels RLE Sensation: WFL - Light Touch RLE Coordination: WFL - gross/fine motor Left Lower Extremity Assessment LLE ROM/Strength/Tone: Within functional levels LLE Sensation: WFL - Light Touch LLE Coordination: WFL - gross/fine motor Trunk Assessment Trunk Assessment: Normal   Balance Balance Balance Assessed: Yes Static Sitting Balance Static Sitting - Balance Support: No upper extremity supported;Feet supported Static Sitting - Level of Assistance: 5: Stand by assistance Static Sitting - Comment/# of Minutes: 3  End of Session PT - End of Session Equipment Utilized During Treatment: Gait belt Activity Tolerance: Patient tolerated treatment well Patient left: in chair;with call bell/phone within reach Nurse Communication: Mobility status   INGOLD,Cayman Brogden 03/10/2012, 10:33 AM  [facility] [phone] (pager)

## 2012-03-10 NOTE — Progress Notes (Signed)
SLP Cancellation Note  SLP continuing to f/u for readiness for PMSV evaluation. Patient remains on vent. Per RT, plan for trach collar trials today. SLP will plan to f/u 5/27, hopeful that patient will be weaned from vent. If patient tolerating trach collar sooner, please re-consult.   Ferdinand Lango MA, CCC-SLP [phone]   Dorthy Magnussen Meryl 03/10/2012, 8:40 AM

## 2012-03-10 NOTE — Progress Notes (Signed)
Pt placed back on vent due to increased WOB. RT tried pt on PSV 5/15 but WOB did not improve. Pt placed back on full support. Pt tolerating at this time.

## 2012-03-10 NOTE — Progress Notes (Signed)
PARENTERAL NUTRITION CONSULT NOTE - FOLLOW UP   Pharmacy Consult for TPN Indication: Prolonged ileus s/p SBR for SBO 5/10  Allergies  Allergen Reactions  . Lorazepam Other (See Comments)    Severe agitated delirium. Tolerates midazolam and other benzo's    Patient Measurements: Height: 6\' 2"  (188 cm) Weight: 160 lb 4.4 oz (72.7 kg) IBW/kg (Calculated) : 82.2  Usual Weight: ~82 kg  Vital Signs: Temp: 97.9 F (36.6 C) (05/24 0450) Temp src: Oral (05/24 0450) BP: 97/67 mmHg (05/24 0700) Pulse Rate: 88  (05/24 0700) Intake/Output from previous day: 05/23 0701 - 05/24 0700 In: 3753 [P.O.:400; I.V.:501; IV Piggyback:252; TPN:2600] Out: 3890 [Urine:3040; Emesis/NG output:775; Drains:75] Intake/Output from this shift:    Labs:  Basename 03/10/12 0500 03/08/12 0720 03/08/12 0447  WBC 14.3* -- 16.4*  HGB 10.5* -- 9.8*  HCT 33.3* -- 30.4*  PLT 421* -- 293  APTT -- 40* --  INR -- 1.30 --     Basename 03/10/12 0500 03/09/12 0500 03/08/12 0447  NA 140 143 142  K 4.1 3.9 4.0  CL 101 103 106  CO2 33* 32 32  GLUCOSE 163* 109* 107*  BUN 28* 27* 30*  CREATININE 0.87 0.94 0.97  LABCREA -- -- --  CREAT24HRUR -- -- --  CALCIUM 8.8 8.6 8.0*  MG -- 2.2 --  PHOS -- 3.4 --  PROT -- 7.0 6.1  ALBUMIN -- 1.8* 1.6*  AST -- 20 19  ALT -- 17 16  ALKPHOS -- 63 49  BILITOT -- 2.6* 1.4*  BILIDIR -- -- --  IBILI -- -- --  PREALBUMIN -- -- --  TRIG -- -- 93  CHOLHDL -- -- --  CHOL -- -- --   Estimated Creatinine Clearance: 87 ml/min (by C-G formula based on Cr of 0.87).    Basename 03/10/12 0422 03/10/12 0014 03/09/12 2008  GLUCAP 140* 122* 141*    Insulin Requirements in the past 24 hours:  6 units  Nutritional Goals:  1850-1950 kCal, 105-120 grams of protein per day, 2-2.3L  Current Nutrition:  Clinimix E 5/20 at 100 ml/hr + lipids 62ml/hr M/W/F providing 1910 kcal; 120 gm protein providing 100% of kcal and 100% protein needs.   Assessment: 66 y.o. male on TPN for  prolonged ileus s/p SBR for SBO on 5/10 at [facility]. Noted PMH of alcohol abuse. TPN initiated 03/01/12. Noted surgery plans to continue TPN for a few days with hopes to transition to tube feeds. Prealbumin remains <3 d/t stress of illness, adjusted formula to provide more protein.   GI: SBO s/p resection 5/10 at [facility], ileus on bowel rest. NPO. NG output - 746ml/24h (improved). S/p OR 5/16 for debridement/abd closure. Noted two bowel movements 5/22 and positive flatus.  Endo: No h/o DM. CBGs well controlled   Lytes: K 4.1 (goal with ileus is 4). HCO3 =33 (stable). Renal: UOP increased to 1.6 ml/kg/hr Pulm: Reintubated (5/21), Trach placed 5/22 FiO2 40% Cards:  hx HTN. BP stable but slightly tachycardic. No pressors, IV metoprolol q8h and hydralazine PRN Hepatobil: AST/ALT ok, tbil elevated. Trig WNL, prealbumin remains low. Neuro: acute encephalopathy (ativan allergy); fent gtt ID: Zosyn D#[id number] for Kleb PNA, presumed aspiration/Kleb and Enterobacter in wound/20K enterococcus in urine. Afebrile, WBC elevated at 16.4 but trending down. Best Practices:  Lovenox, PPI IV, MC  Plan:  - Continue Clinimix E 5/15 at 114ml/hr - Multivitamins, trace elements, and lipids on M/W/F due to national shortage. - Thiamine and folic acid daily in TPN -  F/U BMET in the morning  - Continue to trend prealbumin  Thank you,  Juliette Alcide, PharmD, BCPS.  Pager: [phone] 03/10/2012 7:37 AM

## 2012-03-10 NOTE — Progress Notes (Signed)
Name: Michael Ellison MRN: [id number] DOB: 1946/09/06    LOS: 15  PULMONARY / CRITICAL CARE MEDICINE  Pt Profile:   66 yo male smoker admitted to [facility] 02/24/2012 with n/v and abdominal pain due to SBO due to adhesions.  Had ex lap with SB resection 5/10.  Developed delirium post-op.  Transferred to [facility] 5/14 for respiratory failure requiring intubation.  Developed fascial dehiscence and taken back to OR 5/16.  Failed extubation, and required tracheostomy 5/22. PMHx ETOH, HTN, Prostate cancer   Interval hx:  5/16: OR for fascial dehiscence repair- closed with wound vac 5/18- failed weaning 5/19- extubated 5/21- re-intubated for hypercarbia 5/22-improved co2 and neurostatus, pos almost 2 liters 5/22 SVT>>resolved spontaneously  Subjective: Tolerating pressure support. Sitting in chair. Denies pain Vital Signs: Temp:  [97.8 F (36.6 C)-98.4 F (36.9 C)] 97.8 F (36.6 C) (05/24 0800) Pulse Rate:  [83-102] 89  (05/24 0859) Resp:  [20-35] 26  (05/24 0859) BP: (97-148)/(67-91) 122/77 mmHg (05/24 0859) SpO2:  [92 %-99 %] 97 % (05/24 0859) FiO2 (%):  [40 %] 40 % (05/24 0859)  Physical Examination: General - no distress HEENT - trach site clean Cardiac - s1s2 regular, no murmur Chest - b/l rhonchi, no wheeze Abd - wound vac in place, tender with mild palpation Ext - no edema Neuro - follows commands, alert  Lab Results  Component Value Date   WBC 14.3* 03/10/2012   HGB 10.5* 03/10/2012   HCT 33.3* 03/10/2012   MCV 93.5 03/10/2012   PLT 421* 03/10/2012    BMET    Component Value Date/Time   NA 140 03/10/2012 0500   K 4.1 03/10/2012 0500   CL 101 03/10/2012 0500   CO2 33* 03/10/2012 0500   GLUCOSE 163* 03/10/2012 0500   BUN 28* 03/10/2012 0500   CREATININE 0.87 03/10/2012 0500   CALCIUM 8.8 03/10/2012 0500   GFRNONAA 89* 03/10/2012 0500   GFRAA >90 03/10/2012 0500    Lab Results  Component Value Date   ALT 17 03/09/2012   AST 20 03/09/2012   ALKPHOS 63 03/09/2012   BILITOT 2.6*  03/09/2012    Dg Chest Port 1 View  03/10/2012  *RADIOLOGY REPORT*  Clinical Data: Cough and pneumonia.  PORTABLE CHEST - 1 VIEW  Comparison: Chest x-ray 03/09/2012.  Findings: A tracheostomy tube is in place with tip 6.0 cm above the carina. There is a left-sided subclavian central venous catheter with tip terminating in the proximal superior vena cava. A nasogastric tube is seen extending into the stomach, however, the tip of the nasogastric tube extends below the lower margin of the image.  Lung volumes are low.  There continue to be bibasilar opacities (right greater than left), which may reflect underlying atelectasis and/or consolidation.  There is a moderate right-sided pleural effusion in the right hemithorax which has slightly decreased compared to the prior study.  No definite left pleural effusion.  No evidence of pulmonary edema.  Heart size is normal. The patient is rotated to the left on today's exam, resulting in distortion of the mediastinal contours and reduced diagnostic sensitivity and specificity for mediastinal pathology. Atherosclerotic calcifications within the arch of the aorta.  IMPRESSION: 1.  Support apparatus, as above. 2.  Persistent low lung volumes with bibasilar opacities that may reflect areas of atelectasis and/or consolidation with superimposed moderate right-sided pleural effusion (which has slightly decreased).  Original Report Authenticated By: Florencia Reasons, M.D.   Dg Chest Port 1 View  03/09/2012  *RADIOLOGY REPORT*  Clinical Data: Tracheostomy tube  PORTABLE CHEST - 1 VIEW  Comparison: Mar 08, 2012  Findings: The tracheostomy is stable in good position.  The left subclavian central line tip is stable at the cavoatrial junction. The nasogastric tube tip courses off the inferior film.  Bibasilar airspace opacities and effusions, right greater than left, do not appear significantly changed.  Apical bullous disease is again noted.  IMPRESSION: Stable chest x-ray with  bibasilar airspace opacities and small effusions,  right greater than left.  Original Report Authenticated By: Brandon Melnick, M.D.   Chest Portable 1 View To Assess Tube Placement And Rule-out Pneumothorax  03/08/2012  *RADIOLOGY REPORT*  Clinical Data: Status post tracheostomy.  PORTABLE CHEST - 1 VIEW  Comparison: Earlier same date and 03/07/2012.  Findings: 1035 hours.  Interval tracheostomy.  Tracheostomy appears well positioned.  The left subclavian central line and nasogastric tube are unchanged in position.  There are stable pleural effusions and bibasilar air space opacities.  Apical bullous changes are again noted.  There is no evidence of pneumothorax.  IMPRESSION: Interval tracheostomy without demonstrated complication.  Stable pleural effusions and basilar air space opacities.  Original Report Authenticated By: Gerrianne Scale, M.D.       ASSESSMENT AND PLAN  PULMONARY  ETT:  5/14>>>5/19 >>> 5/21 Trach (DF) 5/22>>  5/14 CT chest>>b/l effusions and basilar ATX/consolidation, severe bullous lung disease with emphysema in upper lobes b/l.  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  Failed extubation, and s/p trach 5/22. Plan: -pressure support wean as tolerated>>may be ready for trach collar soon -f/u CXR, monitor pleural effusions -continue schedule BD's   CARDIOVASCULAR  left  CVL 5/14>>> Rt femoral aline 5/14>>>out  Septic/hypovolemic shock 2nd to SBO complicated by wound dehisence>>resolved.  HTN, Edema, Intermittent tachycardia Plan: -continue IV lasix to keep in negative fluid balance -re-add metoprolol 5/23>>monitor for bradycardia -prn hydralazine  RENAL  Hypokalemia, hypernatremia resolved.   GASTROINTESTINAL  5/09 CT abd/pelvis>>SBO, RLQ transition point, small Rt inguinal hernia, diverticulosis 5/14 CT abd/pelvis>>ileus ABD Wound Vac 5/16>>  SBO  s/p lap with SB resection 5/10 complicated by post-op ileus and fascial dehiscence, and return to OR 5/16. Plan: -post-op care per CCS -keep NPO and continue TNA per CCS   HEMATOLOGIC  Anemia of critical illness. Plan: -f/u CBC -transfuse for Hb < 7   INFECTIOUS  Cultures: BCX2 5/14 >>1/2 gram neg rod>>> 1/2 Klebsiella  UC 5/14 >>Enterococcus (sensitive to ampicillin) Sputum 5/14>> Klebsiella pneumoniae (pan sensitive) Wound 5/14>>Klebsiella Pneumoniae (pan sensitive) & Enterobacter Cloacae (resistant to ancef, cefoxitin) BC X2 5/20>>> Sputum 5/22>>>  Antibiotics: vanc 5/14 >> 5/17 Zosyn 5/13 >>  Wound infection, aspiration pneumonia, UTI. Plan: -D12/14 zosyn   ENDOCRINE CBG (last 3)   Basename 03/10/12 0422 03/10/12 0014 03/09/12 2008  GLUCAP 140* 122* 141*   Hyperglycemia. Plan: -SSI  NEUROLOGIC  Acute metabolic encephalopathy 2nd to sepsis, respiratory failure, post-op pain, and ETOH.  Much improved.  Intolerant of lorazepam (does okay with midazolam). Plan: -continue fentanyl gtt for pain control -limit benzo's and haldol  Best practice/Disposition: -Protonix for SUP -Lovenox for DVT prophylaxis -HOB at 30 degrees -Full code  Reviewed above, examined pt, and agree with assessment/plan.  Slowly improving.  Will transition to PCA for pain control.  Can transfer to 2600/2900 vent SDU bed.  Coralyn Helling, MD 03/10/2012, 11:05 AM Pager:  [phone]

## 2012-03-10 NOTE — Progress Notes (Signed)
Clinical Social Work Department CLINICAL SOCIAL WORK PLACEMENT NOTE 03/10/2012  Patient:  Michael Ellison, Michael Ellison  Account Number:  [id number] Admit date:  02/24/2012  Clinical Social Worker:  Margaree Mackintosh  Date/time:  03/10/2012 12:07 PM  Clinical Social Work is seeking post-discharge placement for this patient at the following level of care:   SKILLED NURSING   (*CSW will update this form in Epic as items are completed)     Patient/family provided with [facility] Department of Clinical Social Work's list of facilities offering this level of care within the geographic area requested by the patient (or if unable, by the patient's family).  03/09/2012  Patient/family informed of their freedom to choose among providers that offer the needed level of care, that participate in Medicare, Medicaid or managed care program needed by the patient, have an available bed and are willing to accept the patient.  03/09/2012  Patient/family informed of [facility]' ownership interest in [facility], as well as of the fact that they are under no obligation to receive care at this facility.  PASARR submitted to EDS on 03/09/2012 PASARR number received from EDS on   FL2 transmitted to all facilities in geographic area requested by pt/family on  03/10/2012 FL2 transmitted to all facilities within larger geographic area on   Patient informed that his/her managed care company has contracts with or will negotiate with  certain facilities, including the following:     Patient/family informed of bed offers received:   Patient chooses bed at  Physician recommends and patient chooses bed at    Patient to be transferred to  on   Patient to be transferred to facility by   The following physician request were entered in Epic:   Additional Comments:

## 2012-03-10 NOTE — Progress Notes (Signed)
Nutrition Follow-up  S/P tracheostomy 5/22, remains on ventilator support.  Continues to receive TPN for prolonged ileus.  Diet Order:  NPO  Patient is receiving TPN with Clinimix E 5/15 @ 100 ml/hr.  Lipids (20% IVFE @ 10 ml/hr), multivitamins, and trace elements are provided 3 times weekly (MWF) due to national backorder.  Provides 1910 kcal and 120 grams protein daily (based on weekly average).  Meets 100% minimum estimated kcal and 100% minimum estimated protein needs.   Meds: Scheduled Meds:   . albuterol  6 puff Inhalation Q6H  . antiseptic oral rinse  15 mL Mouth Rinse QID  . chlorhexidine  15 mL Mouth Rinse BID  . enoxaparin  40 mg Subcutaneous Q24H  . fentaNYL   Intravenous Q4H  . furosemide  20 mg Intravenous Q12H  . furosemide  40 mg Intravenous Once  . insulin aspart  0-15 Units Subcutaneous Q4H  . metoprolol  2.5 mg Intravenous Q8H  . pantoprazole (PROTONIX) IV  40 mg Intravenous QHS  . piperacillin-tazobactam (ZOSYN)  IV  3.375 g Intravenous Q8H  . potassium chloride  10 mEq Intravenous Q1 Hr x 2  . sodium chloride  10-40 mL Intracatheter Q12H  . DISCONTD: fentaNYL  200 mcg Intravenous Once   Continuous Infusions:   . sodium chloride 20 mL/hr (03/09/12 1800)  . fat emulsion 500 kcal (03/09/12 0600)  . fat emulsion    . TPN (CLINIMIX) +/- additives 100 mL/hr at 03/09/12 1746  . TPN (CLINIMIX) +/- additives 100 mL/hr at 03/09/12 0600  . TPN (CLINIMIX) +/- additives    . DISCONTD: fentaNYL infusion INTRAVENOUS 87.5 mcg/hr (03/10/12 0800)   PRN Meds:.sodium chloride, diphenhydrAMINE, diphenhydrAMINE, hydrALAZINE, midazolam, naloxone, ondansetron (ZOFRAN) IV, sodium chloride, sodium chloride  Labs:  CMP     Component Value Date/Time   NA 140 03/10/2012 0500   K 4.1 03/10/2012 0500   CL 101 03/10/2012 0500   CO2 33* 03/10/2012 0500   GLUCOSE 163* 03/10/2012 0500   BUN 28* 03/10/2012 0500   CREATININE 0.87 03/10/2012 0500   CALCIUM 8.8 03/10/2012 0500   PROT 7.0  03/09/2012 0500   ALBUMIN 1.8* 03/09/2012 0500   AST 20 03/09/2012 0500   ALT 17 03/09/2012 0500   ALKPHOS 63 03/09/2012 0500   BILITOT 2.6* 03/09/2012 0500   GFRNONAA 89* 03/10/2012 0500   GFRAA >90 03/10/2012 0500   CBG (last 3)   Basename 03/10/12 0422 03/10/12 0014 03/09/12 2008  GLUCAP 140* 122* 141*     Intake/Output Summary (Last 24 hours) at 03/10/12 1351 Last data filed at 03/10/12 0800  Gross per 24 hour  Intake   2855 ml  Output   2950 ml  Net    -95 ml    Weight Status:  72.7 kg (down from 79.9 kg 5/21)  BMI=20.6  Re-estimated needs:  1900-2000 kcals, 105-120 grams protein daily  Nutrition Dx:  Inadequate oral intake, ongoing.  Goal:  Intake to meet 90-100% of estimated nutrition needs, met.  Intervention:    Continue TPN to meet 100% of estimated nutrition needs while bowel rest needed.  When able to begin TF, recommend Osmolite 1.2 at 10 ml/h, increase by 10 ml every 4 hours to goal rate of 60 ml/h with Prostat 30 ml BID to provide 1928 kcals, 110 grams protein, 1181 ml free water daily.  Monitor:  Ability to transition to enteral nutrition, labs, weight trend.   Hettie Holstein Pager #:  [phone]

## 2012-03-11 ENCOUNTER — Inpatient Hospital Stay (HOSPITAL_COMMUNITY): Payer: Medicare Other

## 2012-03-11 LAB — CULTURE, RESPIRATORY W GRAM STAIN

## 2012-03-11 LAB — BASIC METABOLIC PANEL
BUN: 35 mg/dL — ABNORMAL HIGH (ref 6–23)
Calcium: 8.9 mg/dL (ref 8.4–10.5)
Creatinine, Ser: 1.03 mg/dL (ref 0.50–1.35)
GFR calc non Af Amer: 74 mL/min — ABNORMAL LOW (ref >90)
Glucose, Bld: 233 mg/dL — ABNORMAL HIGH (ref 70–99)
Sodium: 139 mEq/L (ref 135–145)

## 2012-03-11 LAB — CBC
Hemoglobin: 11 g/dL — ABNORMAL LOW (ref 13.0–17.0)
MCH: 30.1 pg (ref 26.0–34.0)
MCHC: 33.2 g/dL (ref 30.0–36.0)
MCV: 90.4 fL (ref 78.0–100.0)

## 2012-03-11 LAB — TRIGLYCERIDES: Triglycerides: 173 mg/dL — ABNORMAL HIGH (ref <150)

## 2012-03-11 LAB — GLUCOSE, CAPILLARY
Glucose-Capillary: 135 mg/dL — ABNORMAL HIGH (ref 70–99)
Glucose-Capillary: 125 mg/dL — ABNORMAL HIGH (ref 70–99)
Glucose-Capillary: 118 mg/dL — ABNORMAL HIGH (ref 70–99)

## 2012-03-11 MED ORDER — THIAMINE HCL 100 MG/ML IJ SOLN
INTRAVENOUS | Status: AC
  Administered 2012-03-11: 18:00:00 via INTRAVENOUS
  Filled 2012-03-11: qty 2400

## 2012-03-11 MED ORDER — BISACODYL 10 MG RE SUPP
10.0000 mg | Freq: Every day | RECTAL | Status: DC | PRN
  Administered 2012-03-12: 10 mg via RECTAL
  Filled 2012-03-11: qty 1

## 2012-03-11 NOTE — Progress Notes (Signed)
Name: Michael Ellison MRN: [id number] DOB: 1945-12-21    LOS: 16  PULMONARY / CRITICAL CARE MEDICINE  Pt Profile:   66 yo male smoker admitted to [facility] 02/24/2012 with n/v and abdominal pain due to SBO due to adhesions.  Had ex lap with SB resection 5/10.  Developed delirium post-op.  Transferred to [facility] 5/14 for respiratory failure requiring intubation.  Developed fascial dehiscence and taken back to OR 5/16.  Failed extubation, and required tracheostomy 5/22. PMHx ETOH, HTN, Prostate cancer   Interval hx:  5/16: OR for fascial dehiscence repair- closed with wound vac 5/18- failed weaning 5/19- extubated 5/21- re-intubated for hypercarbia 5/22-improved co2 and neurostatus, pos almost 2 liters 5/22 SVT>>resolved spontaneously  Subjective: Feels short of breath on trach collar. Admits mild pain at incision. Nurse changing dressing. Family here. Discussed w/ nurse and RT.  Vital Signs: Temp:  [98.2 F (36.8 C)-99.9 F (37.7 C)] 99.2 F (37.3 C) (05/25 1202) Pulse Rate:  [84-118] 104  (05/25 1202) Resp:  [27-94] 29  (05/25 1202) BP: (100-134)/(70-107) 119/74 mmHg (05/25 1202) SpO2:  [92 %-98 %] 94 % (05/25 1202) FiO2 (%):  [40 %] 40 % (05/25 1201) Weight:  [74 kg (163 lb 2.3 oz)] 74 kg (163 lb 2.3 oz) (05/25 0504)  Physical Examination: General - Calm, responsive HEENT - trach site clean Cardiac - s1s2 regular, no murmur Chest - b/l rhonchi, no wheeze. RR 33/ min, Sat 95% on 40% trach collar. Abd - wound vac in place, tender with mild palpation. He points to abd as site of pain. Ext - no edema Neuro - follows commands, alert  Lab Results  Component Value Date   WBC 19.2* 03/11/2012   HGB 11.0* 03/11/2012   HCT 33.1* 03/11/2012   MCV 90.4 03/11/2012   PLT 482* 03/11/2012    BMET    Component Value Date/Time   NA 139 03/11/2012 0430   K 4.0 03/11/2012 0430   CL 100 03/11/2012 0430   CO2 30 03/11/2012 0430   GLUCOSE 233* 03/11/2012 0430   BUN 35* 03/11/2012 0430   CREATININE  1.03 03/11/2012 0430   CALCIUM 8.9 03/11/2012 0430   GFRNONAA 74* 03/11/2012 0430   GFRAA 86* 03/11/2012 0430    Lab Results  Component Value Date   ALT 17 03/09/2012   AST 20 03/09/2012   ALKPHOS 63 03/09/2012   BILITOT 2.6* 03/09/2012    Dg Chest Port 1 View  03/11/2012  *RADIOLOGY REPORT*  Clinical Data: Check endotracheal tube.  PORTABLE CHEST - 1 VIEW  Comparison: 03/10/2012  Findings: Patient has tracheostomy tube, tip approximately 6.5 cm above carina.  Nasogastric tube is in place, tip off the film but beyond the gastroesophageal junction.  Left-sided subclavian line tip overlies the superior vena cava.  The heart is enlarged.  There are bibasilar opacities, partially obscuring the medial left hemi diaphragm.  Findings are consistent with atelectasis or infiltrates but show improvement over recent exams.  No pneumothorax.  IMPRESSION:  1.  Improving aeration of the lung bases. 2.  Tracheostomy tube in appropriate position.  Original Report Authenticated By: Patterson Hammersmith, M.D.   Dg Chest Port 1 View  03/10/2012  *RADIOLOGY REPORT*  Clinical Data: Respiratory distress  PORTABLE CHEST - 1 VIEW  Comparison: 03/10/2012 at 0547 hours  Findings: Tracheostomy in satisfactory position.  Stable left subclavian venous catheter and enteric tube coursing below the diaphragm.  Stable bilateral lower lobe opacities.  Small right pleural effusion.  No pneumothorax.  The heart is normal in size.  IMPRESSION: Stable bilateral lower lobe opacities.  Small right pleural effusion.  Stable support apparatus.  Original Report Authenticated By: Charline Bills, M.D.   Dg Chest Port 1 View  03/10/2012  *RADIOLOGY REPORT*  Clinical Data: Cough and pneumonia.  PORTABLE CHEST - 1 VIEW  Comparison: Chest x-ray 03/09/2012.  Findings: A tracheostomy tube is in place with tip 6.0 cm above the carina. There is a left-sided subclavian central venous catheter with tip terminating in the proximal superior vena cava. A  nasogastric tube is seen extending into the stomach, however, the tip of the nasogastric tube extends below the lower margin of the image.  Lung volumes are low.  There continue to be bibasilar opacities (right greater than left), which may reflect underlying atelectasis and/or consolidation.  There is a moderate right-sided pleural effusion in the right hemithorax which has slightly decreased compared to the prior study.  No definite left pleural effusion.  No evidence of pulmonary edema.  Heart size is normal. The patient is rotated to the left on today's exam, resulting in distortion of the mediastinal contours and reduced diagnostic sensitivity and specificity for mediastinal pathology. Atherosclerotic calcifications within the arch of the aorta.  IMPRESSION: 1.  Support apparatus, as above. 2.  Persistent low lung volumes with bibasilar opacities that may reflect areas of atelectasis and/or consolidation with superimposed moderate right-sided pleural effusion (which has slightly decreased).  Original Report Authenticated By: Florencia Reasons, M.D.    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 5/25 CXR>> improving basilar infiltrates.  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  Failed extubation, and s/p trach 5/22. Working now with good oxygenation but tachypnea on 40% collar.  Plan: -Continue vent<-> trach collar as tolerated -f/u CXR, monitor pleural effusions -continue schedule BD's   CARDIOVASCULAR  left [place] CVL 5/14>>> Rt femoral aline 5/14>>>out  Septic/hypovolemic shock 2nd to SBO complicated by wound dehisence>>resolved.  HTN, Edema, Intermittent tachycardia Plan: -continue IV lasix to keep in  negative fluid balance -re-add metoprolol 5/23>>monitor for bradycardia -prn hydralazine  RENAL  Hypokalemia, hypernatremia resolved.   GASTROINTESTINAL  5/09 CT abd/pelvis>>SBO, RLQ transition point, small Rt inguinal hernia, diverticulosis 5/14 CT abd/pelvis>>ileus ABD Wound Vac 5/16>>  SBO s/p lap with SB resection 5/10 complicated by post-op ileus and fascial dehiscence, and return to OR 5/16. Plan: -post-op care per CCS- Note reviewed -keep NPO and continue TNA per CCS   HEMATOLOGIC  Anemia of critical illness. Hgb 11 today Plan: -f/u CBC -transfuse for Hb < 7   INFECTIOUS  Cultures: BCX2 5/14 >>1/2 gram neg rod>>> 1/2 Klebsiella  UC 5/14 >>Enterococcus (sensitive to ampicillin) Sputum 5/14>> Klebsiella pneumoniae (pan sensitive) Wound 5/14>>Klebsiella Pneumoniae (pan sensitive) & Enterobacter Cloacae (resistant to ancef, cefoxitin) BC X2 5/20>>> Sputum 5/22>>>  Antibiotics: vanc 5/14 >> 5/17 Zosyn 5/13 >>  Wound infection, aspiration pneumonia, UTI. Plan: -D12/14 zosyn   ENDOCRINE CBG (last 3)   Basename 03/11/12 0817 03/11/12 0506 03/11/12 0017  GLUCAP 125* 135* 129*   Hyperglycemia. Plan: -SSI  NEUROLOGIC  Acute metabolic encephalopathy 2nd to sepsis, respiratory failure, post-op pain, and ETOH.  Much improved.  Intolerant of lorazepam (does okay with midazolam). Plan: -continue fentanyl gtt for pain control -limit benzo's and haldol  Best practice/Disposition: -Protonix for SUP -Lovenox for DVT prophylaxis -HOB at 30 degrees -Full code   CD Maple Hudson, MD [facility]

## 2012-03-11 NOTE — Progress Notes (Addendum)
PARENTERAL NUTRITION CONSULT NOTE - FOLLOW UP   Pharmacy Consult for TPN Indication: Prolonged ileus s/p SBR for SBO 5/10  Allergies  Allergen Reactions  . Lorazepam Other (See Comments)    Severe agitated delirium. Tolerates midazolam and other benzo's    Patient Measurements: Height: 6\' 2"  (188 cm) Weight: 163 lb 2.3 oz (74 kg) IBW/kg (Calculated) : 82.2  Usual Weight: ~82 kg  Vital Signs: Temp: 98.2 F (36.8 C) (05/25 0818) Temp src: Oral (05/25 0818) BP: 110/107 mmHg (05/25 0848) Pulse Rate: 95  (05/25 0848) Intake/Output from previous day: 05/24 0701 - 05/25 0700 In: 2946.5 [I.V.:592.5; IV Piggyback:154; TPN:2200] Out: 2625 [Urine:1925; Emesis/NG output:700] Intake/Output from this shift: Total I/O In: 130 [I.V.:20; TPN:110] Out: -   Labs:  Basename 03/11/12 0430 03/10/12 0500  WBC 19.2* 14.3*  HGB 11.0* 10.5*  HCT 33.1* 33.3*  PLT 482* 421*  APTT -- --  INR -- --     Basename 03/11/12 0430 03/10/12 0500 03/09/12 0500  NA 139 140 143  K 4.0 4.1 3.9  CL 100 101 103  CO2 30 33* 32  GLUCOSE 233* 163* 109*  BUN 35* 28* 27*  CREATININE 1.03 0.87 0.94  LABCREA -- -- --  CREAT24HRUR -- -- --  CALCIUM 8.9 8.8 8.6  MG -- -- 2.2  PHOS -- -- 3.4  PROT -- -- 7.0  ALBUMIN -- -- 1.8*  AST -- -- 20  ALT -- -- 17  ALKPHOS -- -- 63  BILITOT -- -- 2.6*  BILIDIR -- -- --  IBILI -- -- --  PREALBUMIN -- -- --  TRIG 173* -- --  CHOLHDL -- -- --  CHOL -- -- --   Estimated Creatinine Clearance: 74.8 ml/min (by C-G formula based on Cr of 1.03).    Basename 03/11/12 0817 03/11/12 0506 03/11/12 0017  GLUCAP 125* 135* 129*    Insulin Requirements in the past 24 hours:  8 units  Nutritional Goals:  1900-2000 kCal, 105-120 grams of protein per day, 2-2.3L  Current Nutrition:  Clinimix E 5/20 at 100 ml/hr + lipids 64ml/hr M/W/F providing 1910 kcal; 120 gm protein providing 100% of kcal and 100% protein needs.   Assessment: 66 y.o. male on TPN for  prolonged ileus s/p SBR for SBO on 5/10 at [facility]. Noted PMH of alcohol abuse. TPN initiated 03/01/12. Noted surgery plans to continue TPN for a few days with hopes to transition to tube feeds. Prealbumin remains <3 d/t stress of illness, adjusted formula to provide more protein.   GI: SBO s/p resection 5/10 at [facility], ileus on bowel rest. NPO. NG output - 740ml/24h (improved). S/p OR 5/16 for debridement/abd closure. Endo: No h/o DM. CBGs well controlled, but plasma glucose elevated Lytes: K 4 (goal with ileus is 4), Corr Ca= 10.66 (sl inc)-watch Renal: UOP increased to 1.2 ml/kg/hr Pulm: Reintubated (5/21), Trach placed 5/22 FiO2 40% Cards:  hx HTN. BP stable but slightly tachycardic. No pressors, IV metoprolol q8h and hydralazine PRN Hepatobil: AST/ALT ok, tbil elevated. Trig WNL, prealbumin remains low. Neuro: acute encephalopathy (ativan allergy); fent gtt ID: Zosyn D#[id number] for Kleb PNA, presumed aspiration/Kleb and Enterobacter in wound/20K enterococcus in urine. Afebrile, WBC elevated at 19.2. Best Practices:  Lovenox, PPI IV, MC  Plan:  - Continue Clinimix E 5/15 at 125ml/hr - Multivitamins, trace elements, and lipids on M/W/F due to national shortage. - Thiamine and folic acid daily in TPN - Continue to trend prealbumin  Thank you,  Juliette Alcide,  PharmD, BCPS.  Pager: [phone] 03/11/2012 9:00 AM

## 2012-03-11 NOTE — Progress Notes (Signed)
9 Days Post-Op  Subjective: Patient trached - interactive, no apparent distress No BM in 3 days VAC does not appear to have been changed in 8 days - will change today   Objective: Vital signs in last 24 hours: Temp:  [98.2 F (36.8 C)-99.9 F (37.7 C)] 98.2 F (36.8 C) (05/25 0818) Pulse Rate:  [84-118] 95  (05/25 0848) Resp:  [27-94] 27  (05/25 0848) BP: (100-134)/(67-107) 110/107 mmHg (05/25 0848) SpO2:  [92 %-98 %] 95 % (05/25 0848) FiO2 (%):  [40 %] 40 % (05/25 0848) Weight:  [163 lb 2.3 oz (74 kg)] 163 lb 2.3 oz (74 kg) (05/25 0504) Last BM Date: 03/08/12  Intake/Output from previous day: 05/24 0701 - 05/25 0700 In: 2946.5 [I.V.:592.5; IV Piggyback:154; TPN:2200] Out: 2625 [Urine:1925; Emesis/NG output:700] Intake/Output this shift: Total I/O In: 130 [I.V.:20; TPN:110] Out: -   General appearance: alert, cooperative and indicates minimal pain GI: soft, occasional bowel sounds VAC good seal  Lab Results:   Basename 03/11/12 0430 03/10/12 0500  WBC 19.2* 14.3*  HGB 11.0* 10.5*  HCT 33.1* 33.3*  PLT 482* 421*   BMET  Basename 03/11/12 0430 03/10/12 0500  NA 139 140  K 4.0 4.1  CL 100 101  CO2 30 33*  GLUCOSE 233* 163*  BUN 35* 28*  CREATININE 1.03 0.87  CALCIUM 8.9 8.8   PT/INR No results found for this basename: LABPROT:2,INR:2 in the last 72 hours ABG No results found for this basename: PHART:2,PCO2:2,PO2:2,HCO3:2 in the last 72 hours  Studies/Results: Dg Chest Port 1 View  03/10/2012  *RADIOLOGY REPORT*  Clinical Data: Respiratory distress  PORTABLE CHEST - 1 VIEW  Comparison: 03/10/2012 at 0547 hours  Findings: Tracheostomy in satisfactory position.  Stable left subclavian venous catheter and enteric tube coursing below the diaphragm.  Stable bilateral lower lobe opacities.  Small right pleural effusion.  No pneumothorax.  The heart is normal in size.  IMPRESSION: Stable bilateral lower lobe opacities.  Small right pleural effusion.  Stable support  apparatus.  Original Report Authenticated By: Charline Bills, M.D.   Dg Chest Port 1 View  03/10/2012  *RADIOLOGY REPORT*  Clinical Data: Cough and pneumonia.  PORTABLE CHEST - 1 VIEW  Comparison: Chest x-ray 03/09/2012.  Findings: A tracheostomy tube is in place with tip 6.0 cm above the carina. There is a left-sided subclavian central venous catheter with tip terminating in the proximal superior vena cava. A nasogastric tube is seen extending into the stomach, however, the tip of the nasogastric tube extends below the lower margin of the image.  Lung volumes are low.  There continue to be bibasilar opacities (right greater than left), which may reflect underlying atelectasis and/or consolidation.  There is a moderate right-sided pleural effusion in the right hemithorax which has slightly decreased compared to the prior study.  No definite left pleural effusion.  No evidence of pulmonary edema.  Heart size is normal. The patient is rotated to the left on today's exam, resulting in distortion of the mediastinal contours and reduced diagnostic sensitivity and specificity for mediastinal pathology. Atherosclerotic calcifications within the arch of the aorta.  IMPRESSION: 1.  Support apparatus, as above. 2.  Persistent low lung volumes with bibasilar opacities that may reflect areas of atelectasis and/or consolidation with superimposed moderate right-sided pleural effusion (which has slightly decreased).  Original Report Authenticated By: Florencia Reasons, M.D.    Anti-infectives: Anti-infectives     Start     Dose/Rate Route Frequency Ordered Stop   03/03/12  1800   vancomycin (VANCOCIN) 1,750 mg in sodium chloride 0.9 % 500 mL IVPB  Status:  Discontinued        1,750 mg 250 mL/hr over 120 Minutes Intravenous Every 12 hours 03/03/12 0628 03/03/12 0918   02/29/12 1900   micafungin (MYCAMINE) 100 mg in sodium chloride 0.9 % 100 mL IVPB  Status:  Discontinued        100 mg 100 mL/hr over 1 Hours  Intravenous Every 24 hours 02/29/12 1754 03/01/12 0933   02/29/12 1730   vancomycin (VANCOCIN) 1,250 mg in sodium chloride 0.9 % 250 mL IVPB  Status:  Discontinued        1,250 mg 166.7 mL/hr over 90 Minutes Intravenous Every 12 hours 02/29/12 1644 03/03/12 0628   02/29/12 1715   vancomycin (VANCOCIN) IVPB 1000 mg/200 mL premix  Status:  Discontinued        1,000 mg 200 mL/hr over 60 Minutes Intravenous  Once 02/29/12 1637 02/29/12 1647   02/29/12 1645   piperacillin-tazobactam (ZOSYN) IVPB 3.375 g  Status:  Discontinued        3.375 g 100 mL/hr over 30 Minutes Intravenous  Once 02/29/12 1637 02/29/12 1641   02/28/12 1800   piperacillin-tazobactam (ZOSYN) IVPB 3.375 g  Status:  Discontinued        3.375 g 12.5 mL/hr over 240 Minutes Intravenous 4 times per day 02/28/12 1454 02/28/12 1510   02/28/12 1800   piperacillin-tazobactam (ZOSYN) IVPB 3.375 g        3.375 g 12.5 mL/hr over 240 Minutes Intravenous Every 8 hours 02/28/12 1510     02/25/12 1109   dextrose 5 % with cefOXitin (MEFOXIN) ADS Med     Comments: HOLLIE, CHRISTINA: cabinet override         02/25/12 1109 02/25/12 2314   02/25/12 1100   cefOXitin (MEFOXIN) 1 g in dextrose 5 % 50 mL IVPB  Status:  Discontinued        1 g 100 mL/hr over 30 Minutes Intravenous 60 min pre-op 02/25/12 1100 02/25/12 1542          Assessment/Plan: s/p Procedure(s) (LRB): DEBRIDEMENT CLOSURE/ABDOMINAL WOUND (N/A) Continue abx - elevated WBC Change VAC today and MWF Dulcolax   LOS: 16 days    Ravinder Lukehart K. 03/11/2012

## 2012-03-11 NOTE — Progress Notes (Signed)
Attempted to remove wound vac dressing and redress per instructions but sponge is very hard to come loose despite soaking with normal saline.Dr Harlon Flor made aware, 50 mcg fentanyl bolus give per order. wound vac dressing changed  by Dr Harlon Flor with my assistance.

## 2012-03-12 LAB — CULTURE, BLOOD (ROUTINE X 2): Culture  Setup Time: 201305202354

## 2012-03-12 LAB — GLUCOSE, CAPILLARY
Glucose-Capillary: 108 mg/dL — ABNORMAL HIGH (ref 70–99)
Glucose-Capillary: 99 mg/dL (ref 70–99)

## 2012-03-12 MED ORDER — THIAMINE HCL 100 MG/ML IJ SOLN
INTRAVENOUS | Status: AC
  Administered 2012-03-12: 18:00:00 via INTRAVENOUS
  Filled 2012-03-12: qty 2400

## 2012-03-12 MED ORDER — BISACODYL 10 MG RE SUPP: 10.0000 mg | Freq: Every day | RECTAL | Status: DC | PRN

## 2012-03-12 NOTE — Progress Notes (Addendum)
PARENTERAL NUTRITION CONSULT NOTE - FOLLOW UP   Pharmacy Consult for TPN Indication: Prolonged ileus s/p SBR for SBO 5/10  Allergies  Allergen Reactions  . Lorazepam Other (See Comments)    Severe agitated delirium. Tolerates midazolam and other benzo's    Patient Measurements: Height: 6\' 2"  (188 cm) Weight: 163 lb 2.3 oz (74 kg) IBW/kg (Calculated) : 82.2  Usual Weight: ~82 kg  Vital Signs: Temp: 98.8 F (37.1 C) (05/26 0344) Temp src: Oral (05/26 0344) BP: 111/70 mmHg (05/26 0600) Pulse Rate: 97  (05/26 0600) Intake/Output from previous day: 05/25 0701 - 05/26 0700 In: 2787.5 [I.V.:440; IV Piggyback:137.5; TPN:2210] Out: 1900 [Urine:1200; Emesis/NG output:650; Drains:50] Intake/Output from this shift:    Labs:  Basename 03/11/12 0430 03/10/12 0500  WBC 19.2* 14.3*  HGB 11.0* 10.5*  HCT 33.1* 33.3*  PLT 482* 421*  APTT -- --  INR -- --     Basename 03/11/12 0430 03/10/12 0500  NA 139 140  K 4.0 4.1  CL 100 101  CO2 30 33*  GLUCOSE 233* 163*  BUN 35* 28*  CREATININE 1.03 0.87  LABCREA -- --  CREAT24HRUR -- --  CALCIUM 8.9 8.8  MG -- --  PHOS -- --  PROT -- --  ALBUMIN -- --  AST -- --  ALT -- --  ALKPHOS -- --  BILITOT -- --  BILIDIR -- --  IBILI -- --  PREALBUMIN -- --  TRIG 173* --  CHOLHDL -- --  CHOL -- --   Estimated Creatinine Clearance: 74.8 ml/min (by C-G formula based on Cr of 1.03).    Basename 03/12/12 0349 03/11/12 2349 03/11/12 2019  GLUCAP 123* 122* 118*    Insulin Requirements in the past 24 hours:  4 units SSI  Nutritional Goals:  1900-2000 kCal, 105-120 grams of protein per day, 2-2.3L  Current Nutrition:  Clinimix E 5/20 at 100 ml/hr + lipids 49ml/hr M/W/F providing 1910 kcal; 120 gm protein providing 100% of kcal and 100% protein needs.   Assessment: 66 y.o. male on TPN for prolonged ileus s/p SBR for SBO on 5/10 at [facility]. Noted PMH of alcohol abuse. TPN initiated 03/01/12. Noted surgery plans to continue TPN  for a few days with hopes to transition to tube feeds. Prealbumin remains <3 d/t stress of illness, adjusted formula to provide more protein.   GI: SBO s/p resection 5/10 at [facility], ileus on bowel rest. NPO. NG output - 748ml/24h (improved). S/p OR 5/16 for debridement/abd closure. VAC changes MWF Endo: No h/o DM. CBGs well controlled, but plasma glucose elevated Lytes: No labs today, 5/25 = K 4 (goal with ileus is 4), Corr Ca= 10.66 (sl inc)-watch Renal: UOP increased to 1.2 ml/kg/hr Pulm: Reintubated (5/21), Trach placed 5/22 FiO2 40% Cards:  hx HTN. BP stable but slightly tachycardic. No pressors, IV metoprolol q8h and hydralazine PRN Hepatobil: AST/ALT ok, tbil elevated. Trig WNL, prealbumin remains low. Neuro: acute encephalopathy (ativan allergy); fent gtt ID: Zosyn D#[id number] for Kleb PNA, presumed aspiration/Kleb and Enterobacter in wound/20K enterococcus in urine. Afebrile, WBC elevated at 19.2. Best Practices:  Lovenox, PPI IV, MC  Plan:  - Continue Clinimix E 5/15 at 11ml/hr - Multivitamins, trace elements, and lipids on M/W/F due to national shortage. - Thiamine and folic acid daily in TPN - Continue to trend prealbumin -labs in am, including Trig, prealbumin  Thank you,  Juliette Alcide, PharmD, BCPS.  Pager: [phone] 03/12/2012 7:25 AM

## 2012-03-12 NOTE — Progress Notes (Signed)
Name: RAYFIELD BEEM MRN: [id number] DOB: 1946/07/10    LOS: 17  PULMONARY / CRITICAL CARE MEDICINE  Pt Profile:   66 yo male smoker admitted to [facility]/24/2012 with n/v and abdominal pain due to SBO due to adhesions.  Had ex lap with SB resection 5/10.  Developed delirium post-op.  Transferred to [facility] 5/14 for respiratory failure requiring intubation.  Developed fascial dehiscence and taken back to OR 5/16.  Failed extubation, and required tracheostomy 5/22. PMHx ETOH, HTN, Prostate cancer   Interval hx:  5/16: OR for fascial dehiscence repair- closed with wound vac 5/18- failed weaning 5/19- extubated 5/21- re-intubated for hypercarbia 5/22-improved co2 and neurostatus, pos almost 2 liters 5/22 SVT>>resolved spontaneously  Subjective: Feels short of breath on trach collar, especially when up in chair. Admits mild pain at incision. Nurse/RT ask ok to remove trach sutures.  Family here. Discussed w/ nurse and RT.  Vital Signs: Temp:  [97.7 F (36.5 C)-99.2 F (37.3 C)] 98.4 F (36.9 C) (05/26 0815) Pulse Rate:  [63-118] 96  (05/26 1000) Resp:  [24-36] 32  (05/26 1000) BP: (103-133)/(69-98) 112/74 mmHg (05/26 1000) SpO2:  [90 %-100 %] 93 % (05/26 1000) FiO2 (%):  [40 %] 40 % (05/26 0831)  Physical Examination: General - Calm, responsive  Numerous family in room HEENT - trach site clean Cardiac - s1s2 regular, no murmur Chest - few rhonchi, no wheeze. RR 33/ min, Sat 90% on 40% trach collar. Abd - wound vac in place, tender with mild palpation.  Ext - no edema Neuro - follows commands, alert  Lab Results  Component Value Date   WBC 19.2* 03/11/2012   HGB 11.0* 03/11/2012   HCT 33.1* 03/11/2012   MCV 90.4 03/11/2012   PLT 482* 03/11/2012    BMET    Component Value Date/Time   NA 139 03/11/2012 0430   K 4.0 03/11/2012 0430   CL 100 03/11/2012 0430   CO2 30 03/11/2012 0430   GLUCOSE 233* 03/11/2012 0430   BUN 35* 03/11/2012 0430   CREATININE 1.03 03/11/2012 0430   CALCIUM  8.9 03/11/2012 0430   GFRNONAA 74* 03/11/2012 0430   GFRAA 86* 03/11/2012 0430    Lab Results  Component Value Date   ALT 17 03/09/2012   AST 20 03/09/2012   ALKPHOS 63 03/09/2012   BILITOT 2.6* 03/09/2012    Dg Chest Port 1 View  03/11/2012  *RADIOLOGY REPORT*  Clinical Data: Check endotracheal tube.  PORTABLE CHEST - 1 VIEW  Comparison: 03/10/2012  Findings: Patient has tracheostomy tube, tip approximately 6.5 cm above carina.  Nasogastric tube is in place, tip off the film but beyond the gastroesophageal junction.  Left-sided subclavian line tip overlies the superior vena cava.  The heart is enlarged.  There are bibasilar opacities, partially obscuring the medial left hemi diaphragm.  Findings are consistent with atelectasis or infiltrates but show improvement over recent exams.  No pneumothorax.  IMPRESSION:  1.  Improving aeration of the lung bases. 2.  Tracheostomy tube in appropriate position.  Original Report Authenticated By: Patterson Hammersmith, M.D.   Dg Chest Port 1 View  03/10/2012  *RADIOLOGY REPORT*  Clinical Data: Respiratory distress  PORTABLE CHEST - 1 VIEW  Comparison: 03/10/2012 at 0547 hours  Findings: Tracheostomy in satisfactory position.  Stable left subclavian venous catheter and enteric tube coursing below the diaphragm.  Stable bilateral lower lobe opacities.  Small right pleural effusion.  No pneumothorax.  The heart is normal in size.  IMPRESSION:  Stable bilateral lower lobe opacities.  Small right pleural effusion.  Stable support apparatus.  Original Report Authenticated By: Charline Bills, M.D.    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 5/25 CXR>> improving basilar infiltrates.  Acute hypoxic/hypercapnic respiratory failure 2nd to delirium, aspiration pneumonia, probable COPD  with bullous emphysema with hx of smoking.  Failed extubation, and s/p trach 5/22. Working now with sat 90%, mild tachypnea on 40% collar. Discussed fatigue/ indication for return to vent w/ him and nurse Plan: -Continue vent<-> trach collar as tolerated -f/u CXR, monitor pleural effusions -continue schedule BD's   CARDIOVASCULAR  left [place] CVL 5/14>>> Rt femoral aline 5/14>>>out  Septic/hypovolemic shock 2nd to SBO complicated by wound dehisence>>resolved.  HTN, Edema, Intermittent tachycardia Plan: -continue IV lasix to keep in negative fluid balance -re-add metoprolol 5/23>>monitor for bradycardia -prn hydralazine  RENAL  Hypokalemia, hypernatremia resolved.   GASTROINTESTINAL  5/09 CT abd/pelvis>>SBO, RLQ transition point, small Rt inguinal hernia, diverticulosis 5/14 CT abd/pelvis>>ileus ABD Wound Vac 5/16>>  SBO s/p lap with SB resection 5/10 complicated by post-op ileus and fascial dehiscence, and return to OR 5/16. Plan: -post-op care per CCS- Note reviewed -keep NPO and continue TNA per CCS   HEMATOLOGIC  Anemia of critical illness. Hgb 11 today Plan: -f/u CBC -transfuse for Hb < 7   INFECTIOUS  Cultures: BCX2 5/14 >>1/2 gram neg rod>>> 1/2 Klebsiella  UC 5/14 >>Enterococcus (sensitive to ampicillin) Sputum 5/14>> Klebsiella pneumoniae (pan sensitive) Wound 5/14>>Klebsiella Pneumoniae (pan sensitive) & Enterobacter Cloacae (resistant to ancef, cefoxitin) BC X2 5/20>>> Sputum 5/22>>>  Antibiotics: vanc 5/14 >> 5/17 Zosyn 5/13 >>  Wound infection, aspiration pneumonia, UTI. Plan: -D12/14 zosyn   ENDOCRINE CBG (last 3)   Basename 03/12/12 0814 03/12/12 0349 03/11/12 2349  GLUCAP 117* 123* 122*   Hyperglycemia. Plan: -SSI  NEUROLOGIC  Acute metabolic encephalopathy 2nd to sepsis, respiratory failure, post-op pain, and ETOH.  Much improved.  Intolerant of lorazepam (does okay with midazolam). Plan: -continue fentanyl gtt for pain  control -limit benzo's and haldol  Best practice/Disposition: -Protonix for SUP -Lovenox for DVT prophylaxis -HOB at 30 degrees -Full code   CD Maple Hudson, MD [facility]

## 2012-03-12 NOTE — Progress Notes (Signed)
96mcg/6ml of Fentanyl disposed of in main pharmacy verified with Genene Churn, RN.

## 2012-03-12 NOTE — Progress Notes (Signed)
Trach sutures removed per order. 

## 2012-03-12 NOTE — Progress Notes (Signed)
10 Days Post-Op  Subjective: VAC changed yesterday.  Will continue changes MWF Mildly SOB on trach collar - per Pulmonary Labs pending Nurse and patient report some flatus this morning.  Objective: Vital signs in last 24 hours: Temp:  [97.7 F (36.5 C)-99.2 F (37.3 C)] 98.8 F (37.1 C) (05/26 0344) Pulse Rate:  [63-118] 97  (05/26 0600) Resp:  [24-36] 31  (05/26 0600) BP: (103-133)/(69-107) 111/70 mmHg (05/26 0600) SpO2:  [90 %-100 %] 99 % (05/26 0600) FiO2 (%):  [40 %] 40 % (05/26 0345) Last BM Date: 03/08/12  Intake/Output from previous day: 05/25 0701 - 05/26 0700 In: 2787.5 [I.V.:440; IV Piggyback:137.5; TPN:2210] Out: 1900 [Urine:1200; Emesis/NG output:650; Drains:50] Intake/Output this shift: Total I/O In: 1237.5 [I.V.:200; IV Piggyback:37.5; TPN:1000] Out: 1125 [Urine:825; Emesis/NG output:250; Drains:50]  General appearance: alert, cooperative and no distress GI: soft, occasional bowel sounds, non-distended VAC with good seal  Lab Results:   Basename 03/11/12 0430 03/10/12 0500  WBC 19.2* 14.3*  HGB 11.0* 10.5*  HCT 33.1* 33.3*  PLT 482* 421*   BMET  Basename 03/11/12 0430 03/10/12 0500  NA 139 140  K 4.0 4.1  CL 100 101  CO2 30 33*  GLUCOSE 233* 163*  BUN 35* 28*  CREATININE 1.03 0.87  CALCIUM 8.9 8.8   PT/INR No results found for this basename: LABPROT:2,INR:2 in the last 72 hours ABG No results found for this basename: PHART:2,PCO2:2,PO2:2,HCO3:2 in the last 72 hours  Studies/Results: Dg Chest Port 1 View  03/11/2012  *RADIOLOGY REPORT*  Clinical Data: Check endotracheal tube.  PORTABLE CHEST - 1 VIEW  Comparison: 03/10/2012  Findings: Patient has tracheostomy tube, tip approximately 6.5 cm above carina.  Nasogastric tube is in place, tip off the film but beyond the gastroesophageal junction.  Left-sided subclavian line tip overlies the superior vena cava.  The heart is enlarged.  There are bibasilar opacities, partially obscuring the medial  left hemi diaphragm.  Findings are consistent with atelectasis or infiltrates but show improvement over recent exams.  No pneumothorax.  IMPRESSION:  1.  Improving aeration of the lung bases. 2.  Tracheostomy tube in appropriate position.  Original Report Authenticated By: Patterson Hammersmith, M.D.   Dg Chest Port 1 View  03/10/2012  *RADIOLOGY REPORT*  Clinical Data: Respiratory distress  PORTABLE CHEST - 1 VIEW  Comparison: 03/10/2012 at 0547 hours  Findings: Tracheostomy in satisfactory position.  Stable left subclavian venous catheter and enteric tube coursing below the diaphragm.  Stable bilateral lower lobe opacities.  Small right pleural effusion.  No pneumothorax.  The heart is normal in size.  IMPRESSION: Stable bilateral lower lobe opacities.  Small right pleural effusion.  Stable support apparatus.  Original Report Authenticated By: Charline Bills, M.D.    Anti-infectives: Anti-infectives     Start     Dose/Rate Route Frequency Ordered Stop   03/03/12 1800   vancomycin (VANCOCIN) 1,750 mg in sodium chloride 0.9 % 500 mL IVPB  Status:  Discontinued        1,750 mg 250 mL/hr over 120 Minutes Intravenous Every 12 hours 03/03/12 0628 03/03/12 0918   02/29/12 1900   micafungin (MYCAMINE) 100 mg in sodium chloride 0.9 % 100 mL IVPB  Status:  Discontinued        100 mg 100 mL/hr over 1 Hours Intravenous Every 24 hours 02/29/12 1754 03/01/12 0933   02/29/12 1730   vancomycin (VANCOCIN) 1,250 mg in sodium chloride 0.9 % 250 mL IVPB  Status:  Discontinued  1,250 mg 166.7 mL/hr over 90 Minutes Intravenous Every 12 hours 02/29/12 1644 03/03/12 0628   02/29/12 1715   vancomycin (VANCOCIN) IVPB 1000 mg/200 mL premix  Status:  Discontinued        1,000 mg 200 mL/hr over 60 Minutes Intravenous  Once 02/29/12 1637 02/29/12 1647   02/29/12 1645   piperacillin-tazobactam (ZOSYN) IVPB 3.375 g  Status:  Discontinued        3.375 g 100 mL/hr over 30 Minutes Intravenous  Once 02/29/12 1637  02/29/12 1641   02/28/12 1800   piperacillin-tazobactam (ZOSYN) IVPB 3.375 g  Status:  Discontinued        3.375 g 12.5 mL/hr over 240 Minutes Intravenous 4 times per day 02/28/12 1454 02/28/12 1510   02/28/12 1800   piperacillin-tazobactam (ZOSYN) IVPB 3.375 g        3.375 g 12.5 mL/hr over 240 Minutes Intravenous Every 8 hours 02/28/12 1510     02/25/12 1109   dextrose 5 % with cefOXitin (MEFOXIN) ADS Med     Comments: HOLLIE, CHRISTINA: cabinet override         02/25/12 1109 02/25/12 2314   02/25/12 1100   cefOXitin (MEFOXIN) 1 g in dextrose 5 % 50 mL IVPB  Status:  Discontinued        1 g 100 mL/hr over 30 Minutes Intravenous 60 min pre-op 02/25/12 1100 02/25/12 1542          Assessment/Plan: s/p Procedure(s) (LRB): DEBRIDEMENT CLOSURE/ABDOMINAL WOUND (N/A) Dulcolax suppository today VAC change MWF   LOS: 17 days    Michael Ellison K. 03/12/2012

## 2012-03-13 ENCOUNTER — Inpatient Hospital Stay (HOSPITAL_COMMUNITY): Payer: Medicare Other

## 2012-03-13 LAB — DIFFERENTIAL
Basophils Absolute: 0.1 10*3/uL (ref 0.0–0.1)
Lymphs Abs: 1.9 10*3/uL (ref 0.7–4.0)
Monocytes Relative: 6 % (ref 3–12)
Neutro Abs: 11.5 10*3/uL — ABNORMAL HIGH (ref 1.7–7.7)

## 2012-03-13 LAB — CBC
HCT: 31.8 % — ABNORMAL LOW (ref 39.0–52.0)
MCHC: 32.1 g/dL (ref 30.0–36.0)
MCV: 91.1 fL (ref 78.0–100.0)
RDW: 14.6 % (ref 11.5–15.5)
WBC: 14.5 10*3/uL — ABNORMAL HIGH (ref 4.0–10.5)

## 2012-03-13 LAB — COMPREHENSIVE METABOLIC PANEL
AST: 26 U/L (ref 0–37)
Albumin: 1.8 g/dL — ABNORMAL LOW (ref 3.5–5.2)
Alkaline Phosphatase: 109 U/L (ref 39–117)
BUN: 29 mg/dL — ABNORMAL HIGH (ref 6–23)
Chloride: 107 mEq/L (ref 96–112)
Potassium: 3.7 mEq/L (ref 3.5–5.1)
Total Bilirubin: 1.4 mg/dL — ABNORMAL HIGH (ref 0.3–1.2)

## 2012-03-13 LAB — CHOLESTEROL, TOTAL: Cholesterol: 82 mg/dL (ref 0–200)

## 2012-03-13 LAB — GLUCOSE, CAPILLARY
Glucose-Capillary: 113 mg/dL — ABNORMAL HIGH (ref 70–99)
Glucose-Capillary: 117 mg/dL — ABNORMAL HIGH (ref 70–99)
Glucose-Capillary: 105 mg/dL — ABNORMAL HIGH (ref 70–99)

## 2012-03-13 MED ORDER — ALBUTEROL SULFATE HFA 108 (90 BASE) MCG/ACT IN AERS
6.0000 | INHALATION_SPRAY | RESPIRATORY_TRACT | Status: DC | PRN
  Administered 2012-03-13 – 2012-03-15 (×2): 6 via RESPIRATORY_TRACT

## 2012-03-13 MED ORDER — TRACE MINERALS CR-CU-MN-SE-ZN 10-1000-500-60 MCG/ML IV SOLN
INTRAVENOUS | Status: AC
  Administered 2012-03-13: 17:00:00 via INTRAVENOUS
  Filled 2012-03-13: qty 2400

## 2012-03-13 MED ORDER — INSULIN ASPART 100 UNIT/ML ~~LOC~~ SOLN
0.0000 [IU] | Freq: Four times a day (QID) | SUBCUTANEOUS | Status: DC
Start: 2012-03-13 — End: 2012-03-17
  Administered 2012-03-13 – 2012-03-15 (×2): 2 [IU] via SUBCUTANEOUS

## 2012-03-13 MED ORDER — FAT EMULSION 20 % IV EMUL
250.0000 mL | INTRAVENOUS | Status: AC
  Administered 2012-03-13: 250 mL via INTRAVENOUS
  Filled 2012-03-13: qty 250

## 2012-03-13 NOTE — Progress Notes (Signed)
Occupational Therapy Treatment Patient Details Name: Michael Ellison MRN: [id number] DOB: [birth year] Today's Date: 03/13/2012 Time: 8657-8469 OT Time Calculation (min): 28 min  OT Assessment / Plan / Recommendation Comments on Treatment Session Pt mobilizing well. Endurance improving. Ready to begin higher level ADL tasks standing at sink level.     Follow Up Recommendations  LTACH    Barriers to Discharge       Equipment Recommendations  Defer to next venue    Recommendations for Other Services    Frequency Min 2X/week   Plan Discharge plan remains appropriate    Precautions / Restrictions     Pertinent Vitals/Pain     ADL  Toilet Transfer: Min guard (+2 for lines, safety.) Toilet Transfer Method: Sit to stand Transfers/Ambulation Related to ADLs: Pt ambulated to the door and back to the bed X2. Ready for higher level endurance activities at sink level.    OT Diagnosis:    OT Problem List:   OT Treatment Interventions:     OT Goals ADL Goals ADL Goal: Toilet Transfer - Progress: Progressing toward goals  Visit Information  Last OT Received On: 03/13/12 Assistance Needed: +2 (for lines) PT/OT Co-Evaluation/Treatment: Yes    Subjective Data  Subjective: Pt nodded answers to simple questions. Pt was on vent.   Prior Functioning       Cognition  Overall Cognitive Status: Appears within functional limits for tasks assessed/performed Difficult to assess due to: Tracheostomy Arousal/Alertness: Awake/alert Orientation Level: Appears intact for tasks assessed Behavior During Session: [facility] for tasks performed    Mobility Bed Mobility Bed Mobility: Supine to Sit;Sit to Supine Supine to Sit: 4: Min assist;HOB elevated;With rails Sitting - Scoot to Edge of Bed: 5: Supervision Sit to Supine: HOB flat;4: Min guard Details for Bed Mobility Assistance: Min A needed for lines. Min amt of VCs for sequencing, hand placement and technique. Transfers Sit to Stand: 4: Min  guard;From bed;With upper extremity assist Stand to Sit: 4: Min guard;With upper extremity assist;To bed Details for Transfer Assistance: Min amt of VCs for hand placement. Performed x2.   Exercises    Balance Balance Balance Assessed: Yes Static Sitting Balance Static Sitting - Balance Support: No upper extremity supported;Feet supported Static Sitting - Level of Assistance: 5: Stand by assistance Static Standing Balance Static Standing - Balance Support: Bilateral upper extremity supported Static Standing - Level of Assistance: 4: Min assist  End of Session OT - End of Session Activity Tolerance: Patient limited by fatigue Patient left: in chair;with call bell/phone within reach   Ryett Hamman A OTR/L [phone] 03/13/2012, 1:03 PM

## 2012-03-13 NOTE — Progress Notes (Signed)
ANTIBIOTIC CONSULT NOTE - FOLLOW UP  Pharmacy Consult for Zosyn Indication: Klebsiella PNA and Klebsiella + Enterobacter wound infection  Allergies  Allergen Reactions  . Lorazepam Other (See Comments)    Severe agitated delirium. Tolerates midazolam and other benzo's    Patient Measurements: Height: 6\' 2"  (188 cm) Weight: 163 lb 2.3 oz (74 kg) IBW/kg (Calculated) : 82.2   Vital Signs: Temp: 97.6 F (36.4 C) (05/27 0759) Temp src: Axillary (05/27 0759) BP: 111/77 mmHg (05/27 0822) Pulse Rate: 88  (05/27 0822) Intake/Output from previous day: 05/26 0701 - 05/27 0700 In: 3442.5 [P.O.:325; I.V.:480; IV Piggyback:137.5; TPN:2500] Out: 1950 [Urine:1500; Emesis/NG output:350; Drains:100] Intake/Output from this shift: Total I/O In: -  Out: 200 [Urine:200]  Labs:  Basename 03/13/12 0500 03/11/12 0430  WBC 14.5* 19.2*  HGB 10.2* 11.0*  PLT 443* 482*  LABCREA -- --  CREATININE 0.82 1.03   Estimated Creatinine Clearance: 94 ml/min (by C-G formula based on Cr of 0.82). No results found for this basename: VANCOTROUGH:2,VANCOPEAK:2,VANCORANDOM:2,GENTTROUGH:2,GENTPEAK:2,GENTRANDOM:2,TOBRATROUGH:2,TOBRAPEAK:2,TOBRARND:2,AMIKACINPEAK:2,AMIKACINTROU:2,AMIKACIN:2, in the last 72 hours   Assessment: 73 YOM with h/o ETOH abuse who was tx from [facility] for respiratory failure s/p exlap for SBO on 5/10.   Zosyn continues for PNA, wound infection and UTI. Patient is afebrile, WBC 14.5<---19.2. PCT down from 52 on admit to 3.7 on 5/21. Scr is stable, UOP good (0.8 ml/kg/hr).  Zosyn 5/13 >>(day 14 of a planned 14 day course) Vanc 5/14 >>5/17 Mycamine 5/14 >> 5/15  5/14 abd incision site -mod Kleb Pneumo (pan sens), mod Enterobacter Clo. (pan sens except R ancef) 5/14 resp cx - few Kleb Pneumo (pan sens) 5/14 blood cx - 1/2 klebsiella (pan sens) 5/14 Urine: 20k Enterococcus (pan sens) 5/14 MRSA PCR: neg 5/22 Resp: ngtd 5/20 Blood: ngtd  Goal of Therapy:  Clinical Improvement  Plan:    - Continue Zosyn 3.375g IV q8h- 4hr infusion.  - F/U discontinuation today 5/27 (completes 14 day course)  [place] R. Darlene Bartelt, PharmD  Pager [phone] 03/13/2012 10:09 AM

## 2012-03-13 NOTE — Progress Notes (Signed)
Name: Michael Ellison MRN: [id number] DOB: [birth year]    LOS: 18  PULMONARY / CRITICAL CARE MEDICINE  Pt Profile:   66 yo male smoker admitted to [facility] 02/24/2012 with n/v and abdominal pain due to SBO due to adhesions.  Had ex lap with SB resection 5/10.  Developed delirium post-op.  Transferred to [facility] 5/14 for respiratory failure requiring intubation.  Developed fascial dehiscence and taken back to OR 5/16.  Failed extubation, and required tracheostomy 5/22. PMHx ETOH, HTN, Prostate cancer   Interval hx:  5/16: OR for fascial dehiscence repair- closed with wound vac 5/18- failed weaning 5/19- extubated 5/21- re-intubated for hypercarbia 5/22-improved co2 and neurostatus, pos almost 2 liters 5/22 SVT>>resolved spontaneously  Subjective: Feels short of breath on trach collar, especially when up in chair. Admits mild pain at incision. Nurse/RT ask ok to remove trach sutures.  Family here. Discussed w/ nurse and RT.  Vital Signs: Temp:  [97.6 F (36.4 C)-98.8 F (37.1 C)] 97.6 F (36.4 C) (05/27 0759) Pulse Rate:  [80-101] 88  (05/27 0822) Resp:  [23-38] 27  (05/27 0822) BP: (104-124)/(67-87) 111/77 mmHg (05/27 0822) SpO2:  [93 %-100 %] 99 % (05/27 0822) FiO2 (%):  [40 %-60 %] 40 % (05/27 1134)  Physical Examination: General - Calm, responsive  Numerous family in room HEENT - trach site clean Cardiac - s1s2 regular, no murmur Chest - few rhonchi, no wheeze. RR 33/ min, Sat 90% on 40% trach collar. Abd - wound vac in place, tender with mild palpation.  Ext - no edema Neuro - follows commands, alert  Lab Results  Component Value Date   WBC 14.5* 03/13/2012   HGB 10.2* 03/13/2012   HCT 31.8* 03/13/2012   MCV 91.1 03/13/2012   PLT 443* 03/13/2012    BMET    Component Value Date/Time   NA 141 03/13/2012 0500   K 3.7 03/13/2012 0500   CL 107 03/13/2012 0500   CO2 25 03/13/2012 0500   GLUCOSE 100* 03/13/2012 0500   BUN 29* 03/13/2012 0500   CREATININE 0.82 03/13/2012 0500   CALCIUM 8.4 03/13/2012 0500   GFRNONAA >90 03/13/2012 0500   GFRAA >90 03/13/2012 0500    Lab Results  Component Value Date   ALT 25 03/13/2012   AST 26 03/13/2012   ALKPHOS 109 03/13/2012   BILITOT 1.4* 03/13/2012    Intake/Output Summary (Last 24 hours) at 03/13/12 1321 Last data filed at 03/13/12 1300  Gross per 24 hour  Intake   3210 ml  Output   1900 ml  Net   1310 ml   Dg Chest Port 1 View  03/13/2012  *RADIOLOGY REPORT*  Clinical Data: Pleural effusion.  COPD.  PORTABLE CHEST - 1 VIEW  Comparison: 03/11/2012  Findings: Tracheostomy tube, left central catheter, and NG tube appear unchanged.  Persistent chronic atelectasis at the left lung base posterior medially, essentially unchanged since 02/28/2012.  Small right pleural effusion.  The right perihilar haziness has resolved.  The aeration at the bases is slightly improved. Severe emphysema with blebs in the apices.  IMPRESSION: Assistant atelectasis in the left lower lobe posterior medially. Slight improvement in the other atelectasis.  Persistent small right effusion.  Resolution of hazy right perihilar infiltrate.  Original Report Authenticated By: Gwynn Burly, M.D.    Medications reviewed   ASSESSMENT AND PLAN  PULMONARY  ETT:  5/14>>>5/19 >>> 5/21 Trach (DF) 5/22>>  5/14 CT chest>>b/l effusions and basilar ATX/consolidation, severe bullous lung disease with emphysema in  upper lobes b/l. 5/25 CXR>> improving basilar infiltrates.  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  Failed extubation, and s/p trach 5/22. Working now with sat 90%, mild tachypnea on 40% collar. Discussed fatigue/ indication for return to vent w/ him and nurse Plan: -Continue vent<-> begin PS trials today. -f/u CXR, monitor pleural effusions -continue schedule BD's   CARDIOVASCULAR  left [place] CVL 5/14>>> Rt femoral aline  5/14>>>out  Septic/hypovolemic shock 2nd to SBO complicated by wound dehisence>>resolved.  HTN, Edema, Intermittent tachycardia Plan: -continue IV lasix to keep in negative fluid balance -re-add metoprolol 5/23>>monitor for bradycardia -prn hydralazine  RENAL  Hypokalemia, hypernatremia resolved.   GASTROINTESTINAL  5/09 CT abd/pelvis>>SBO, RLQ transition point, small Rt inguinal hernia, diverticulosis 5/14 CT abd/pelvis>>ileus ABD Wound Vac 5/16>>  SBO s/p lap with SB resection 5/10 complicated by post-op ileus and fascial dehiscence, and return to OR 5/16. Plan: -post-op care per [facility]CS- Note reviewed -keep NPO and continue TNA per [facility]   HEMATOLOGIC  Anemia of critical illness. Hgb 11 today Plan: -f/u CBC -transfuse for Hb < 7   INFECTIOUS  Cultures: BCX2 5/14 >>1/2 gram neg rod>>> 1/2 Klebsiella  UC 5/14 >>Enterococcus (sensitive to ampicillin) Sputum 5/14>> Klebsiella pneumoniae (pan sensitive) Wound 5/14>>Klebsiella Pneumoniae (pan sensitive) & Enterobacter Cloacae (resistant to ancef, cefoxitin) BC X2 5/20>>> Sputum 5/22>>>  Antibiotics: vanc 5/14 >> 5/17 Zosyn 5/13 >>  Wound infection, aspiration pneumonia, UTI. Plan: -D12/14 zosyn   ENDOCRINE CBG (last 3)   Basename 03/13/12 1302 03/13/12 0757 03/13/12 0457  GLUCAP 121* 111* 117*   Hyperglycemia. Plan: -SSI  NEUROLOGIC  Acute metabolic encephalopathy 2nd to sepsis, respiratory failure, post-op pain, and ETOH.  Much improved.  Intolerant of lorazepam (does okay with midazolam). Plan: -continue fentanyl gtt for pain control -limit benzo's and haldol  Will begin PS trails today, patient is having significant asynchrony with full support.  Will get to PS today and would investigate PC over [facility] as he seemed more comfortable with that.  Patient seen and examined, agree with above note.  I dictated the care and orders written for this patient under my direction.  [name],  M.D. [phone]

## 2012-03-13 NOTE — Evaluation (Signed)
Passy-Muir Speaking Valve - Evaluation Patient Details  Name: Michael Ellison MRN: [id number] Date of Birth: 12-11-1945  Today's Date: 03/13/2012 Time: 0910-0950 SLP Time Calculation (min): 40 min  Past Medical History:  Past Medical History  Diagnosis Date  . Hypertension   . Cancer   . Cancer of prostate    Past Surgical History:  Past Surgical History  Procedure Date  . Hernia repair   . Prostate biopsy   . Robotic prostate surgery 2011  . Laparotomy 02/25/2012    Procedure: EXPLORATORY LAPAROTOMY;  Surgeon: [name], MD;  Location: [facility]S;  Service: General;  Laterality: N/A;  . Bowel resection 02/25/2012    Procedure: SMALL BOWEL RESECTION;  Surgeon: [name], MD;  Location: [facility] OR[facility];  Service: General;;  . Wound debridement 03/02/2012    Procedure: DEBRIDEMENT CLOSURE/ABDOMINAL WOUND;  Surgeon: Cherylynn Ridges, MD;  Location: [facility] OR;  Service: General;  Laterality: N/A;   HPI:  66 yo male smoker admitted to [facility] 02/24/2012 with n/v and abdominal pain due to SBO due to adhesions.  Had ex lap with SB resection 5/10.  Developed delirium post-op.  Transferred to [facility] 5/14 for respiratory failure requiring intubation.  Developed fascial dehiscence and taken back to OR 5/16.  Failed extubation 5/19, reintubated and required tracheostomy 5/22.     Assessment / Plan / Recommendation Clinical Impression  Pt demonstrates adequate tolerance of PMSV with intelligible speech at conversation level with no significant change in vital signs or CO2 trapping over 30-40 minutes of observation. Pt demonstrates functional cough with oral expectoration of secretions. Despite size 8 cuffed trach pt redirects air well and can tolerate valve during all waking hours. Pt is cognitively intact an SLP provided precautions and training in valve removal. Pt demonstrated ability to remove valve and verbalized undersatnding of precautions. Feel pt will progress now that he is better able to mobilize his  secretions of a regular basis. MD verbalized that pt may keep cuff deflated. Pt also currently has large bore NG tube for suction. Tube will need to be removed prior to swallow eval. Please order eval when pt is ready.     SLP Assessment  Patient needs continued Speech Lanaguage Pathology Services    Follow Up Recommendations  Inpatient Rehab    Frequency and Duration min 2x/week  2 weeks   Pertinent Vitals/Pain NA    SLP Goals Potential to Achieve Goals: Good Progress/Goals/Alternative treatment plan discussed with pt/caregiver and they: Agree SLP Goal #1: Pt will tolerate PMSV during all waking hours with intermittent supervision without distress.  SLP Goal #2: Pt will demonstrate independence with valve placement and removal with min verbal and visual cues.    PMSV Trial  PMSV was placed for: 40 minutes Able to redirect subglottic air through upper airway: Yes Able to Attain Phonation: Yes Voice Quality: Wet Able to Expectorate Secretions: Yes Level of Secretion Expectoration with PMSV: Oral Breath Support for Phonation: Adequate Intelligibility: Intelligible Respirations During Trial: 26  (u pto 32) SpO2 During Trial: 95 % (to 98. Rn states this is baseline on 60% O2.) Behavior: Alert   Tracheostomy Tube  Additional Tracheostomy Tube Assessment Trach Collar Period: 12 hours Secretion Description: thin clear copious Frequency of Tracheal Suctioning: frequent Level of Secretion Expectoration: Tracheal    Vent Dependency  Vent Dependent: Yes FiO2 (%): 60 % Nocturnal Vent: Yes    Cuff Deflation Trial Tolerated Cuff Deflation: Yes Length of Time for Cuff Deflation Trial: 40 minutes Behavior:  Alert;Cooperative;Expresses self well   Taleeya Blondin, [name] 03/13/2012, 10:09 AM

## 2012-03-13 NOTE — Progress Notes (Addendum)
11 Days Post-Op  Subjective: Patient had two bowel movements after Dulcolax yesterday.  Objective: Vital signs in last 24 hours: Temp:  [98.2 F (36.8 C)-98.8 F (37.1 C)] 98.4 F (36.9 C) (05/27 0400) Pulse Rate:  [80-102] 85  (05/27 0700) Resp:  [23-38] 24  (05/27 0700) BP: (104-124)/(67-87) 109/74 mmHg (05/27 0700) SpO2:  [92 %-100 %] 98 % (05/27 0700) FiO2 (%):  [40 %-60 %] 40 % (05/27 0542) Last BM Date: 03/12/12  Intake/Output from previous day: 05/26 0701 - 05/27 0700 In: 3442.5 [P.O.:325; I.V.:480; IV Piggyback:137.5; TPN:2500] Out: 1950 [Urine:1500; Emesis/NG output:350; Drains:100] Intake/Output this shift:    General appearance: alert, cooperative and no distress GI: soft, + bowel sounds VAC with good seal  Lab Results:   Basename 03/13/12 0500 03/11/12 0430  WBC 14.5* 19.2*  HGB 10.2* 11.0*  HCT 31.8* 33.1*  PLT 443* 482*   BMET  Basename 03/13/12 0500 03/11/12 0430  NA 141 139  K 3.7 4.0  CL 107 100  CO2 25 30  GLUCOSE 100* 233*  BUN 29* 35*  CREATININE 0.82 1.03  CALCIUM 8.4 8.9   PT/INR No results found for this basename: LABPROT:2,INR:2 in the last 72 hours ABG No results found for this basename: PHART:2,PCO2:2,PO2:2,HCO3:2 in the last 72 hours  Studies/Results: No results found.  Anti-infectives: Anti-infectives     Start     Dose/Rate Route Frequency Ordered Stop   03/03/12 1800   vancomycin (VANCOCIN) 1,750 mg in sodium chloride 0.9 % 500 mL IVPB  Status:  Discontinued        1,750 mg 250 mL/hr over 120 Minutes Intravenous Every 12 hours 03/03/12 0628 03/03/12 0918   02/29/12 1900   micafungin (MYCAMINE) 100 mg in sodium chloride 0.9 % 100 mL IVPB  Status:  Discontinued        100 mg 100 mL/hr over 1 Hours Intravenous Every 24 hours 02/29/12 1754 03/01/12 0933   02/29/12 1730   vancomycin (VANCOCIN) 1,250 mg in sodium chloride 0.9 % 250 mL IVPB  Status:  Discontinued        1,250 mg 166.7 mL/hr over 90 Minutes Intravenous Every  12 hours 02/29/12 1644 03/03/12 0628   02/29/12 1715   vancomycin (VANCOCIN) IVPB 1000 mg/200 mL premix  Status:  Discontinued        1,000 mg 200 mL/hr over 60 Minutes Intravenous  Once 02/29/12 1637 02/29/12 1647   02/29/12 1645   piperacillin-tazobactam (ZOSYN) IVPB 3.375 g  Status:  Discontinued        3.375 g 100 mL/hr over 30 Minutes Intravenous  Once 02/29/12 1637 02/29/12 1641   02/28/12 1800   piperacillin-tazobactam (ZOSYN) IVPB 3.375 g  Status:  Discontinued        3.375 g 12.5 mL/hr over 240 Minutes Intravenous 4 times per day 02/28/12 1454 02/28/12 1510   02/28/12 1800  piperacillin-tazobactam (ZOSYN) IVPB 3.375 g       3.375 g 12.5 mL/hr over 240 Minutes Intravenous Every 8 hours 02/28/12 1510     02/25/12 1109   dextrose 5 % with cefOXitin (MEFOXIN) ADS Med     Comments: HOLLIE, CHRISTINA: cabinet override         02/25/12 1109 02/25/12 2314   02/25/12 1100   cefOXitin (MEFOXIN) 1 g in dextrose 5 % 50 mL IVPB  Status:  Discontinued        1 g 100 mL/hr over 30 Minutes Intravenous 60 min pre-op 02/25/12 1100 02/25/12 1542  Assessment/Plan: s/p Procedure(s) (LRB): DEBRIDEMENT CLOSURE/ABDOMINAL WOUND (N/A) WBC improving - continue current antibiotic regimen + BM; swallowing eval and feeding per primary team/ speech therapy Alternatively, OK to initiate tube feeds   LOS: 18 days    Michael Ellison K. 03/13/2012

## 2012-03-13 NOTE — Progress Notes (Signed)
Physical Therapy Treatment Patient Details Name: Michael Ellison MRN: [id number] DOB: 15-Nov-1945 Today's Date: 03/13/2012 Time: 1223-1251 PT Time Calculation (min): 28 min  PT Assessment / Plan / Recommendation Comments on Treatment Session  Improving steadily with each session; sats and vitals remain at good levels with mobility.  Will continue with disposition to [facility], but may need to update if he continues to improve at this rate.    Follow Up Recommendations  LTACH    Barriers to Discharge        Equipment Recommendations  Defer to next venue    Recommendations for Other Services    Frequency Min 3X/week   Plan Discharge plan remains appropriate;Frequency remains appropriate    Precautions / Restrictions Precautions Precautions: Fall Precaution Comments: trach with vent support Restrictions Weight Bearing Restrictions: No   Pertinent Vitals/Pain     Mobility  Bed Mobility Bed Mobility: Supine to Sit;Sit to Supine Supine to Sit: 4: Min assist;HOB elevated;With rails Sitting - Scoot to Edge of Bed: 5: Supervision Sit to Supine: HOB flat;4: Min guard Details for Bed Mobility Assistance: Min A needed for lines. Min amt of VCs for sequencing, hand placement and technique. Transfers Transfers: Sit to Stand;Stand to Sit Sit to Stand: 4: Min guard;From bed;With upper extremity assist Stand to Sit: 4: Min guard;With upper extremity assist;To bed Details for Transfer Assistance: Min amt of VCs for hand placement. Performed x2. Ambulation/Gait Ambulation/Gait Assistance: 4: Min guard Ambulation Distance (Feet): 20 Feet (then 25 on 2nd trial) Assistive device: Rolling walker Ambulation/Gait Assistance Details: generally steady though guarded Gait Pattern: Step-through pattern;Decreased step length - right;Decreased step length - left;Decreased stride length Stairs: No Wheelchair Mobility Wheelchair Mobility: No    Exercises     PT Diagnosis:    PT Problem List:   PT  Treatment Interventions:     PT Goals Acute Rehab PT Goals Time For Goal Achievement: 03/24/12 Potential to Achieve Goals: Good PT Goal: Supine/Side to Sit - Progress: Progressing toward goal PT Goal: Sit at Edge Of Bed - Progress: Progressing toward goal PT Goal: Sit to Stand - Progress: Progressing toward goal PT Goal: Stand to Sit - Progress: Progressing toward goal PT Transfer Goal: Bed to Chair/Chair to Bed - Progress: Progressing toward goal PT Goal: Ambulate - Progress: Progressing toward goal  Visit Information  Last PT Received On: 03/13/12 Assistance Needed: +2 (for abundance of lines)    Subjective Data  Subjective: mouthed that he was tired   Cognition  Overall Cognitive Status: Appears within functional limits for tasks assessed/performed Difficult to assess due to: Tracheostomy Arousal/Alertness: Awake/alert Orientation Level: Appears intact for tasks assessed Behavior During Session: [facility] for tasks performed    Balance  Balance Balance Assessed: Yes Static Sitting Balance Static Sitting - Balance Support: No upper extremity supported;Feet supported Static Sitting - Level of Assistance: 5: Stand by assistance Static Sitting - Comment/# of Minutes: 20 Static Standing Balance Static Standing - Balance Support: Bilateral upper extremity supported Static Standing - Level of Assistance: 4: Min assist  End of Session PT - End of Session Activity Tolerance: Patient tolerated treatment well Patient left: in bed;with call bell/phone within reach;with family/visitor present Nurse Communication: Mobility status    Charese Abundis, Eliseo Gum 03/13/2012, 1:12 PM  03/13/2012  Cofield Bing, PT [phone] (pager)

## 2012-03-13 NOTE — Progress Notes (Signed)
PARENTERAL NUTRITION CONSULT NOTE - FOLLOW UP   Pharmacy Consult for TPN Indication: Prolonged ileus s/p SBR for SBO 5/10  Allergies  Allergen Reactions  . Lorazepam Other (See Comments)    Severe agitated delirium. Tolerates midazolam and other benzo's    Patient Measurements: Height: 6\' 2"  (188 cm) Weight: 163 lb 2.3 oz (74 kg) IBW/kg (Calculated) : 82.2  Usual Weight: ~82 kg  Vital Signs: Temp: 98.4 F (36.9 C) (05/27 0400) Temp src: Oral (05/27 0400) BP: 109/74 mmHg (05/27 0700) Pulse Rate: 85  (05/27 0700) Intake/Output from previous day: 05/26 0701 - 05/27 0700 In: 3322.5 [P.O.:325; I.V.:460; IV Piggyback:137.5; TPN:2400] Out: 1950 [Urine:1500; Emesis/NG output:350; Drains:100] Intake/Output from this shift:    Labs:  Basename 03/13/12 0500 03/11/12 0430  WBC 14.5* 19.2*  HGB 10.2* 11.0*  HCT 31.8* 33.1*  PLT 443* 482*  APTT -- --  INR -- --     Basename 03/13/12 0500 03/11/12 0430  NA 141 139  K 3.7 4.0  CL 107 100  CO2 25 30  GLUCOSE 100* 233*  BUN 29* 35*  CREATININE 0.82 1.03  LABCREA -- --  CREAT24HRUR -- --  CALCIUM 8.4 8.9  MG 2.1 --  PHOS 2.9 --  PROT 7.2 --  ALBUMIN 1.8* --  AST 26 --  ALT 25 --  ALKPHOS 109 --  BILITOT 1.4* --  BILIDIR -- --  IBILI -- --  PREALBUMIN -- --  TRIG 99 173*  CHOLHDL -- --  CHOL 82 --   Estimated Creatinine Clearance: 94 ml/min (by C-G formula based on Cr of 0.82).    Basename 03/13/12 0457 03/13/12 0040 03/12/12 2028  GLUCAP 117* 113* 121*    Insulin Requirements in the past 24 hours:  2 units SSI  Nutritional Goals:  1900-2000 kCal, 105-120 grams of protein per day, 2-2.3L  Current Nutrition:  Clinimix E 5/20 at 100 ml/hr + lipids 4ml/hr M/W/F providing 1910 kcal; 120 gm protein providing 100% of kcal and 100% protein needs.   Assessment: 66 y.o. male on TPN for prolonged ileus s/p SBR for SBO on 5/10 at [facility]. Noted PMH of alcohol abuse. TPN initiated 03/01/12. Noted surgery plans  to continue TPN for a few days with hopes to transition to tube feeds. Prealbumin remains <3 d/t stress of illness, adjusted formula to provide more protein.   GI: SBO s/p resection 5/10 at [facility], ileus on bowel rest. NPO. NG output - 735ml/24h (improved). S/p OR 5/16 for debridement/abd closure. VAC changes MWF. Bisacodyl supp given 5/26 and had BMx2.  Endo: No h/o DM. CBGs well controlled Lytes: All WNL, including Corr Ca= 10.16 Renal: SCr WNL/stable, UOP 0.8 ml/kg/hr Pulm: Trach placed 5/22, trach collar as tolerates and vent PRN. Cards:  hx HTN. BP stable but slightly tachycardic. No pressors, IV metoprolol q8h and hydralazine PRN Hepatobil: AST/ALT ok, tbili improved from prev (2.6 ->1.4). Trig WNL, prealbumin remains low, prealbumin recheck drawn this am.  Neuro: acute encephalopathy (ativan allergy); fent gtt ID: Zosyn D#[id number] for Kleb bacteremia (repeat BC=NG) Kleb again in 5/22 trach aspirate from presumed aspiration/Kleb and Enterobacter in wound/20K enterococcus in urine. Afebrile, WBC elevated at 14.5. Best Practices:  Lovenox, PPI IV, MC  Plan:  - Continue Clinimix E 5/15 at 147ml/hr -Change CBGs to q6h from q4h - Multivitamins, trace elements, and lipids on M/W/F due to national shortage. - Thiamine and folic acid daily in TPN - Continue to trend prealbumin  Thank you,  Juliette Alcide, PharmD,  BCPS.  Pager: [phone] 03/13/2012 7:12 AM

## 2012-03-13 NOTE — Clinical Social Work Note (Signed)
Covering CSW spoke with RN to follow up on pt's progress. Per RN, pt is doing better along he had to go back on the trach today. Pt is currently weaning and is on a shiley #8 at 40% O2. CSW will continue to follow address discharge plan.   Dede Query, MSW, Theresia Majors [phone]

## 2012-03-14 DIAGNOSIS — K929 Disease of digestive system, unspecified: Secondary | ICD-10-CM

## 2012-03-14 LAB — CBC
HCT: 30.9 % — ABNORMAL LOW (ref 39.0–52.0)
MCH: 28.9 pg (ref 26.0–34.0)
MCHC: 31.7 g/dL (ref 30.0–36.0)
MCV: 91.2 fL (ref 78.0–100.0)
RDW: 14.6 % (ref 11.5–15.5)

## 2012-03-14 LAB — BASIC METABOLIC PANEL
BUN: 26 mg/dL — ABNORMAL HIGH (ref 6–23)
Calcium: 8.4 mg/dL (ref 8.4–10.5)
Creatinine, Ser: 0.76 mg/dL (ref 0.50–1.35)
GFR calc Af Amer: >90 mL/min (ref >90)
GFR calc non Af Amer: >90 mL/min (ref >90)
Potassium: 3.9 mEq/L (ref 3.5–5.1)

## 2012-03-14 LAB — GLUCOSE, CAPILLARY
Glucose-Capillary: 106 mg/dL — ABNORMAL HIGH (ref 70–99)
Glucose-Capillary: 122 mg/dL — ABNORMAL HIGH (ref 70–99)
Glucose-Capillary: 83 mg/dL (ref 70–99)

## 2012-03-14 LAB — TRIGLYCERIDES: Triglycerides: 100 mg/dL (ref <150)

## 2012-03-14 MED ORDER — OSMOLITE 1.2 CAL PO LIQD
1000.0000 mL | ORAL | Status: DC
  Administered 2012-03-14 – 2012-03-19 (×5): 1000 mL
  Filled 2012-03-14 (×8): qty 1000

## 2012-03-14 MED ORDER — FUROSEMIDE 8 MG/ML PO SOLN
40.0000 mg | Freq: Every day | ORAL | Status: DC
  Administered 2012-03-14 – 2012-03-21 (×7): 40 mg
  Filled 2012-03-14 (×8): qty 5

## 2012-03-14 MED ORDER — THIAMINE HCL 100 MG/ML IJ SOLN
INTRAVENOUS | Status: AC
  Administered 2012-03-14: 18:00:00 via INTRAVENOUS
  Filled 2012-03-14: qty 2400

## 2012-03-14 NOTE — Progress Notes (Signed)
Dr Ortencia Kick notified regarding tube feeding order. Ok to use NGT for feeding.

## 2012-03-14 NOTE — Progress Notes (Addendum)
Name: Michael Ellison MRN: [id number] DOB: [birth year]    LOS: 19  PULMONARY / CRITICAL CARE MEDICINE  Pt Profile:   66 yo male smoker admitted to [facility] 02/24/2012 with n/v and abdominal pain due to SBO due to adhesions.  Had ex lap with SB resection 5/10.  Developed delirium post-op.  Transferred to [facility] 5/14 for respiratory failure requiring intubation.  Developed fascial dehiscence and taken back to OR 5/16.  Failed extubation, and required tracheostomy 5/22. PMHx ETOH, HTN, Prostate cancer   Interval hx:  5/16: OR for fascial dehiscence repair- closed with wound vac 5/18- failed weaning 5/19- extubated 5/21- re-intubated for hypercarbia 5/22-improved co2 and neurostatus, pos almost 2 liters 5/22 SVT>>resolved spontaneously  Subjective: tolerating trach collar, denies cp/ dyspnea. Able to use PM valve Discussed w/ nurse and RT.  Vital Signs: Temp:  [97.4 F (36.3 C)-98.6 F (37 C)] 97.4 F (36.3 C) (05/28 1251) Pulse Rate:  [80-96] 88  (05/28 1549) Resp:  [22-37] 34  (05/28 1549) BP: (99-139)/(69-96) 118/69 mmHg (05/28 1549) SpO2:  [92 %-100 %] 98 % (05/28 1549) FiO2 (%):  [35 %-100 %] 35 % (05/28 1549) Weight:  [164 lb 14.5 oz (74.8 kg)] 164 lb 14.5 oz (74.8 kg) (05/28 0000)  Physical Examination: General - Calm, responsive   HEENT - trach site clean Cardiac - s1s2 regular, no murmur Chest - few rhonchi, no wheeze.  Abd - wound vac in place, tender with mild palpation.  Ext - no edema Neuro - follows commands, alert  Lab Results  Component Value Date   WBC 12.1* 03/14/2012   HGB 9.8* 03/14/2012   HCT 30.9* 03/14/2012   MCV 91.2 03/14/2012   PLT 402* 03/14/2012    BMET    Component Value Date/Time   NA 138 03/14/2012 0435   K 3.9 03/14/2012 0435   CL 106 03/14/2012 0435   CO2 26 03/14/2012 0435   GLUCOSE 110* 03/14/2012 0435   BUN 26* 03/14/2012 0435   CREATININE 0.76 03/14/2012 0435   CALCIUM 8.4 03/14/2012 0435   GFRNONAA >90 03/14/2012 0435   GFRAA >90 03/14/2012  0435    Lab Results  Component Value Date   ALT 25 03/13/2012   AST 26 03/13/2012   ALKPHOS 109 03/13/2012   BILITOT 1.4* 03/13/2012    Intake/Output Summary (Last 24 hours) at 03/14/12 1641 Last data filed at 03/14/12 1001  Gross per 24 hour  Intake  252.5 ml  Output    900 ml  Net -647.5 ml   Dg Chest Port 1 View  03/13/2012  *RADIOLOGY REPORT*  Clinical Data: Pleural effusion.  COPD.  PORTABLE CHEST - 1 VIEW  Comparison: 03/11/2012  Findings: Tracheostomy tube, left central catheter, and NG tube appear unchanged.  Persistent chronic atelectasis at the left lung base posterior medially, essentially unchanged since 02/28/2012.  Small right pleural effusion.  The right perihilar haziness has resolved.  The aeration at the bases is slightly improved. Severe emphysema with blebs in the apices.  IMPRESSION: Assistant atelectasis in the left lower lobe posterior medially. Slight improvement in the other atelectasis.  Persistent small right effusion.  Resolution of hazy right perihilar infiltrate.  Original Report Authenticated By: Gwynn Burly, M.D.    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 5/27 CXR>> rt base atx  Acute hypoxic/hypercapnic respiratory  failure 2nd to delirium, aspiration pneumonia, 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  Failed extubation, and s/p trach 5/22.   Plan: -Continue ATc as tolerated -f/u CXR, monitor pleural effusions -continue schedule BD's   CARDIOVASCULAR  left [place] CVL 5/14>>> Rt femoral aline 5/14>>>out  Septic/hypovolemic shock 2nd to SBO complicated by wound dehisence>>resolved.  HTN, Edema, Intermittent tachycardia Plan: -continue  lasix 40 to keep in negative fluid balance -re-added metoprolol 5/23>>monitor for bradycardia -prn  hydralazine  RENAL  Hypokalemia, hypernatremia resolved.   GASTROINTESTINAL  5/09 CT abd/pelvis>>SBO, RLQ transition point, small Rt inguinal hernia, diverticulosis 5/14 CT abd/pelvis>>ileus ABD Wound Vac 5/16>>  SBO s/p lap with SB resection 5/10 complicated by post-op ileus and fascial dehiscence, and return to OR 5/16. Plan: -post-op care per CCS- Note reviewed -start TFs and continue TNA per CCS, if tolerated will taper TNA to off   HEMATOLOGIC  Anemia of critical illness.  Plan:  -transfuse for Hb < 7   INFECTIOUS  Cultures: BCX2 5/14 >>1/2 gram neg rod>>> 1/2 Klebsiella  UC 5/14 >>Enterococcus (sensitive to ampicillin) Sputum 5/14>> Klebsiella pneumoniae (pan sensitive) Wound 5/14>>Klebsiella Pneumoniae (pan sensitive) & Enterobacter Cloacae (resistant to ancef, cefoxitin) BC X2 5/20>>>ng Sputum 5/22>>>klebs, enterobacter  Antibiotics: vanc 5/14 >> 5/17 Zosyn 5/13 >>  Wound infection, aspiration pneumonia, UTI. Plan: -Dc zosyn   ENDOCRINE CBG (last 3)   Basename 03/14/12 1251 03/14/12 0655 03/14/12 0042  GLUCAP 97 105* 106*   Hyperglycemia. Plan: -SSI  NEUROLOGIC  Acute metabolic encephalopathy 2nd to sepsis, respiratory failure, post-op pain, and ETOH.  Much improved.  Intolerant of lorazepam (does okay with midazolam). Plan: -continue fentanyl  for pain control -limit benzo's and haldol   DISPO - has no LTAC benefits, plan for SNF/ rehab  Oretha Milch., M.D. [phone]

## 2012-03-14 NOTE — Progress Notes (Signed)
Passy-Muir Speaking Valve - Treatment Patient Details  Name: Michael Ellison MRN: [id number] Date of Birth: 12-07-1945  Today's Date: 03/14/2012 Time: 1020-1055 SLP Time Calculation (min): 35 min  Past Medical History:  Past Medical History  Diagnosis Date  . Hypertension   . Cancer   . Cancer of prostate    Past Surgical History:  Past Surgical History  Procedure Date  . Hernia repair   . Prostate biopsy   . Robotic prostate surgery 2011  . Laparotomy 02/25/2012    Procedure: EXPLORATORY LAPAROTOMY;  Surgeon: Michael Bering, MD;  Location: [facility]S;  Service: General;  Laterality: N/A;  . Bowel resection 02/25/2012    Procedure: SMALL BOWEL RESECTION;  Surgeon: Michael Bering, MD;  Location: [facility] ORS;  Service: General;;  . Wound debridement 03/02/2012    Procedure: DEBRIDEMENT CLOSURE/ABDOMINAL WOUND;  Surgeon: Michael Ridges, MD;  Location: [facility] OR;  Service: General;  Laterality: N/A;    Assessment / Plan / Recommendation Clinical Impression  Pt had to go back on vent after becoming tired after last session. MD reports pts respiratory rate remaining too high. Though pt tolerating valve with adequate oxygenation and phonation at phrse level, as well as prodoctive cough with oral expectoration, will recommend pt only wear valve with full staff supervision due to poor endurance when off the vent.     Plan  Continue with current plan of care    Follow Up Recommendations  Inpatient Rehab    Pertinent Vitals/Pain NA    SLP Goals Potential to Achieve Goals: Good Progress/Goals/Alternative treatment plan discussed with pt/caregiver and they: Agree SLP Goal #1: Pt will tolerate PMSV during all waking hours with intermittent supervision without distress.  SLP Goal #1 - Progress: Progressing toward goal SLP Goal #2: Pt will demonstrate independence with valve placement and removal with min verbal and visual cues.  SLP Goal #2 - Progress: Progressing toward goal   PMSV Trial  PMSV  was placed for: 30 minutes Able to redirect subglottic air through upper airway: Yes Able to Attain Phonation: Yes Voice Quality: Wet Able to Expectorate Secretions: Yes Level of Secretion Expectoration with PMSV: Oral Breath Support for Phonation: Adequate Intelligibility: Intelligible Respirations During Trial: 35  SpO2 During Trial:  (poor reading) Pulse During Trial: 86  Behavior: Alert   Tracheostomy Tube  Additional Tracheostomy Tube Assessment Trach Collar Period: 12 hours Secretion Description: thin clear copious Frequency of Tracheal Suctioning: frequent Level of Secretion Expectoration: Tracheal    Vent Dependency  Vent Dependent: Yes FiO2 (%): 40 % Nocturnal Vent: Yes    Cuff Deflation Trial Tolerated Cuff Deflation: Yes Length of Time for Cuff Deflation Trial: 40 minutes Behavior: Alert;Cooperative;Expresses self well   Michael Ellison, Michael Ellison 03/14/2012, 1:55 PM

## 2012-03-14 NOTE — Progress Notes (Signed)
Noted recommendations for LTAC - per payor representative, pt has no LTAC benefit.

## 2012-03-14 NOTE — Progress Notes (Signed)
Nutrition Follow-up  Per surgical MD note on 5/27, ok to initiate TF. Pt continues on TPN at this time for nutrition support. This RD attempted to call physician regarding initiation of TF, however MD did not return page. Discussed with RN. Recommendations for initiation of TF at end of note.  Pt demonstrating adequate tolerance of PMSV per SLP on 5/27.  Patient is receiving TPN with Clinimix E 5/15 @ 100 ml/hr.  Lipids (20% IVFE @ 10 ml/hr), multivitamins, and trace elements are provided 3 times weekly (MWF) due to national backorder.  Provides 1910 kcal and 120 grams protein daily (based on weekly average).  Meets 100% minimum estimated kcal and 100% minimum estimated protein needs.  Diet Order:  NPO  Meds: Scheduled Meds:   . albuterol  6 puff Inhalation Q6H  . antiseptic oral rinse  15 mL Mouth Rinse QID  . chlorhexidine  15 mL Mouth Rinse BID  . enoxaparin  40 mg Subcutaneous Q24H  . fentaNYL   Intravenous Q4H  . insulin aspart  0-15 Units Subcutaneous Q6H  . metoprolol  2.5 mg Intravenous Q8H  . pantoprazole (PROTONIX) IV  40 mg Intravenous QHS  . piperacillin-tazobactam (ZOSYN)  IV  3.375 g Intravenous Q8H  . sodium chloride  10-40 mL Intracatheter Q12H   Continuous Infusions:   . sodium chloride 20 mL/hr (03/13/12 1700)  . fat emulsion 250 mL (03/13/12 1906)  . TPN (CLINIMIX) +/- additives 100 mL/hr at 03/12/12 1814  . TPN (CLINIMIX) +/- additives    . TPN (CLINIMIX) +/- additives 100 mL/hr at 03/13/12 1906   PRN Meds:.sodium chloride, albuterol, bisacodyl, diphenhydrAMINE, diphenhydrAMINE, hydrALAZINE, naloxone, ondansetron (ZOFRAN) IV, sodium chloride, sodium chloride  Labs:  CMP     Component Value Date/Time   NA 138 03/14/2012 0435   K 3.9 03/14/2012 0435   CL 106 03/14/2012 0435   CO2 26 03/14/2012 0435   GLUCOSE 110* 03/14/2012 0435   BUN 26* 03/14/2012 0435   CREATININE 0.76 03/14/2012 0435   CALCIUM 8.4 03/14/2012 0435   PROT 7.2 03/13/2012 0500   ALBUMIN 1.8*  03/13/2012 0500   AST 26 03/13/2012 0500   ALT 25 03/13/2012 0500   ALKPHOS 109 03/13/2012 0500   BILITOT 1.4* 03/13/2012 0500   GFRNONAA >90 03/14/2012 0435   GFRAA >90 03/14/2012 0435   CBG (last 3)   Basename 03/14/12 0655 03/14/12 0042 03/13/12 1605  GLUCAP 105* 106* 105*   Lipid Panel     Component Value Date/Time   CHOL 82 03/13/2012 0500   TRIG 100 03/14/2012 0435   Recent Labs  Basename 03/13/12 0500   PREALBUMIN 12.5*     Intake/Output Summary (Last 24 hours) at 03/14/12 1140 Last data filed at 03/14/12 1001  Gross per 24 hour  Intake   1065 ml  Output   1250 ml  Net   -185 ml  Last BM 5/26  Weight Status:  74.8 kg (up from 72.7 kg on 5/24)  Body mass index is 21.17 kg/(m^2). WNL  Estimated needs:  1900-2000 kcals, 105-120 grams protein daily  Nutrition Dx:  Inadequate oral intake, ongoing  Goal:  Intake to meet 90-100% of estimated nutrition needs, met.  Intervention:   Continue TPN to meet 100% of estimated nutrition needs while bowel rest needed.  Recommend initiation of Osmolite 1.2 at 10 ml/h, increase by 10 ml every 8 hours to goal rate of 60 ml/h with Prostat 30 ml BID to provide 1928 kcals, 110 grams protein, 1181 ml free  water daily. Wean TPN with advancement of enteral nutrition, per TPN pharmacist's discretion.  Monitor:  Weights, labs, TPN prescription, initiation of TF, I/O's  Adair Laundry Pager #:  [phone]

## 2012-03-14 NOTE — Progress Notes (Signed)
PARENTERAL NUTRITION CONSULT NOTE - FOLLOW UP   Pharmacy Consult for TPN Indication: Prolonged ileus s/p SBR for SBO 5/10  Allergies  Allergen Reactions  . Lorazepam Other (See Comments)    Severe agitated delirium. Tolerates midazolam and other benzo's    Patient Measurements: Height: 6\' 2"  (188 cm) Weight: 164 lb 14.5 oz (74.8 kg) IBW/kg (Calculated) : 82.2  Usual Weight: ~82 kg  Vital Signs: Temp: 98.1 F (36.7 C) (05/28 0350) Temp src: Oral (05/28 0350) BP: 118/84 mmHg (05/28 0700) Pulse Rate: 85  (05/28 0600) Intake/Output from previous day: 05/27 0701 - 05/28 0700 In: 1562.5 [P.O.:300; I.V.:200; IV Piggyback:62.5; TPN:1000] Out: 1250 [Urine:1000; Emesis/NG output:150; Drains:100] Intake/Output from this shift:    Labs:  Basename 03/14/12 0435 03/13/12 0500  WBC 12.1* 14.5*  HGB 9.8* 10.2*  HCT 30.9* 31.8*  PLT 402* 443*  APTT -- --  INR -- --     Basename 03/14/12 0435 03/13/12 0500  NA 138 141  K 3.9 3.7  CL 106 107  CO2 26 25  GLUCOSE 110* 100*  BUN 26* 29*  CREATININE 0.76 0.82  LABCREA -- --  CREAT24HRUR -- --  CALCIUM 8.4 8.4  MG 2.0 2.1  PHOS 2.8 2.9  PROT -- 7.2  ALBUMIN -- 1.8*  AST -- 26  ALT -- 25  ALKPHOS -- 109  BILITOT -- 1.4*  BILIDIR -- --  IBILI -- --  PREALBUMIN -- 12.5*  TRIG 100 99  CHOLHDL -- --  CHOL -- 82   Estimated Creatinine Clearance: 97.4 ml/min (by C-G formula based on Cr of 0.76).    Basename 03/14/12 0655 03/14/12 0042 03/13/12 1605  GLUCAP 105* 106* 105*    Insulin Requirements in the past 24 hours:  2 units SSI  Nutritional Goals:  1900-2000 kCal, 105-120 grams of protein per day, 2-2.3L  Current Nutrition:  Clinimix E 5/20 at 100 ml/hr + lipids 58ml/hr M/W/F providing 1910 kcal; 120 gm protein providing 100% of kcal and 100% protein needs.   Assessment: 66 y.o. male on TPN for prolonged ileus s/p SBR for SBO on 5/10 at [facility]. Noted PMH of alcohol abuse. TPN initiated 03/01/12. Noted  surgery plans to initiate tube feeds. Prealbumin now up to 12.5 (goal >17). Likely low d/t stress of illness, adjusted formula to provide more protein.   GI: SBO s/p resection 5/10 at [facility], ileus on bowel rest. NPO. NG output - 150ml/24h (improved). S/p OR 5/16 for debridement/abd closure. VAC changes MWF. Bisacodyl supp given 5/26 and had BMx2. Noted plans for swallow evaluation or initiate tube feeds as tolerated.   Endo: No h/o DM. CBGs well controlled requiring little insulin.  Lytes: All WNL, including Corr Ca= 10.16 Renal: SCr WNL/stable, UOP 0.6 ml/kg/hr Pulm: Trach placed 5/22, trach collar as tolerates and vent PRN. Cards:  hx HTN. BP stable but mildly tachycardic. No pressors, IV metoprolol q8h and hydralazine PRN Hepatobil: AST/ALT ok, tbili improved from prev (2.6 ->1.4). Trig WNL, prealbumin up to 12.5.  Neuro: acute encephalopathy (ativan allergy); requiring fent PCA ID: Zosyn D#16/14 for Kleb bacteremia (repeat BC=NG) Kleb again in 5/22 trach aspirate from presumed aspiration/Kleb and Enterobacter in wound/20K enterococcus in urine. Afebrile, WBC elevated at 14.5. Best Practices:  Lovenox, PPI IV, MC  Plan:  - Continue Clinimix E 5/15 at 141ml/hr - Continue CBG checks at q4h - Multivitamins, trace elements, and lipids on M/W/F due to national shortage. - Thiamine and folic acid daily in TPN - Continue to  trend prealbumin  Thank you,  Brett Fairy, PharmD Pager: [phone]  03/14/2012 7:54 AM

## 2012-03-14 NOTE — Progress Notes (Signed)
Patient ID: Michael Ellison, male   DOB: 07/23/1946, 66 y.o.   MRN: [id number] 12 Days Post-Op   Subjective: Patient reports some pain.  No n/v.    Objective: Vital signs in last 24 hours: Temp:  [97.4 F (36.3 C)-98.6 F (37 C)] 98.1 F (36.7 C) (05/28 0350) Pulse Rate:  [82-95] 85  (05/28 0600) Resp:  [23-35] 23  (05/28 0700) BP: (99-139)/(73-96) 118/84 mmHg (05/28 0700) SpO2:  [90 %-100 %] 98 % (05/28 0600) FiO2 (%):  [40 %-60 %] 40 % (05/28 0400) Weight:  [164 lb 14.5 oz (74.8 kg)] 164 lb 14.5 oz (74.8 kg) (05/28 0000) Last BM Date: 03/12/12  Intake/Output from previous day: 05/27 0701 - 05/28 0700 In: 1562.5 [P.O.:300; I.V.:200; IV Piggyback:62.5; TPN:1000] Out: 1250 [Urine:1000; Emesis/NG output:150; Drains:100] Intake/Output this shift:    General appearance: alert, cooperative and no distress GI: soft, mildly tender.  Retention sutures in place.   VAC with good seal  Lab Results:   Basename 03/14/12 0435 03/13/12 0500  WBC 12.1* 14.5*  HGB 9.8* 10.2*  HCT 30.9* 31.8*  PLT 402* 443*   BMET  Basename 03/14/12 0435 03/13/12 0500  NA 138 141  K 3.9 3.7  CL 106 107  CO2 26 25  GLUCOSE 110* 100*  BUN 26* 29*  CREATININE 0.76 0.82  CALCIUM 8.4 8.4   PT/INR No results found for this basename: LABPROT:2,INR:2 in the last 72 hours ABG No results found for this basename: PHART:2,PCO2:2,PO2:2,HCO3:2 in the last 72 hours  Studies/Results: Dg Chest Port 1 View  03/13/2012  *RADIOLOGY REPORT*  Clinical Data: Pleural effusion.  COPD.  PORTABLE CHEST - 1 VIEW  Comparison: 03/11/2012  Findings: Tracheostomy tube, left central catheter, and NG tube appear unchanged.  Persistent chronic atelectasis at the left lung base posterior medially, essentially unchanged since 02/28/2012.  Small right pleural effusion.  The right perihilar haziness has resolved.  The aeration at the bases is slightly improved. Severe emphysema with blebs in the apices.  IMPRESSION: Assistant  atelectasis in the left lower lobe posterior medially. Slight improvement in the other atelectasis.  Persistent small right effusion.  Resolution of hazy right perihilar infiltrate.  Original Report Authenticated By: Gwynn Burly, M.D.    Anti-infectives: Anti-infectives     Start     Dose/Rate Route Frequency Ordered Stop   03/03/12 1800   vancomycin (VANCOCIN) 1,750 mg in sodium chloride 0.9 % 500 mL IVPB  Status:  Discontinued        1,750 mg 250 mL/hr over 120 Minutes Intravenous Every 12 hours 03/03/12 0628 03/03/12 0918   02/29/12 1900   micafungin (MYCAMINE) 100 mg in sodium chloride 0.9 % 100 mL IVPB  Status:  Discontinued        100 mg 100 mL/hr over 1 Hours Intravenous Every 24 hours 02/29/12 1754 03/01/12 0933   02/29/12 1730   vancomycin (VANCOCIN) 1,250 mg in sodium chloride 0.9 % 250 mL IVPB  Status:  Discontinued        1,250 mg 166.7 mL/hr over 90 Minutes Intravenous Every 12 hours 02/29/12 1644 03/03/12 0628   02/29/12 1715   vancomycin (VANCOCIN) IVPB 1000 mg/200 mL premix  Status:  Discontinued        1,000 mg 200 mL/hr over 60 Minutes Intravenous  Once 02/29/12 1637 02/29/12 1647   02/29/12 1645   piperacillin-tazobactam (ZOSYN) IVPB 3.375 g  Status:  Discontinued        3.375 g 100 mL/hr over 30 Minutes  Intravenous  Once 02/29/12 1637 02/29/12 1641   02/28/12 1800   piperacillin-tazobactam (ZOSYN) IVPB 3.375 g  Status:  Discontinued        3.375 g 12.5 mL/hr over 240 Minutes Intravenous 4 times per day 02/28/12 1454 02/28/12 1510   02/28/12 1800   piperacillin-tazobactam (ZOSYN) IVPB 3.375 g        3.375 g 12.5 mL/hr over 240 Minutes Intravenous Every 8 hours 02/28/12 1510     02/25/12 1109   dextrose 5 % with cefOXitin (MEFOXIN) ADS Med     Comments: HOLLIE, CHRISTINA: cabinet override         02/25/12 1109 02/25/12 2314   02/25/12 1100   cefOXitin (MEFOXIN) 1 g in dextrose 5 % 50 mL IVPB  Status:  Discontinued        1 g 100 mL/hr over 30 Minutes  Intravenous 60 min pre-op 02/25/12 1100 02/25/12 1542          Assessment/Plan: s/p Procedure(s) (LRB): DEBRIDEMENT CLOSURE/ABDOMINAL WOUND (N/A) WBC improving - continue current antibiotic regimen + BM; swallowing eval and feeding per primary team/ speech therapy Alternatively, OK to initiate tube feeds   LOS: 19 days    Zophia Marrone 03/14/2012

## 2012-03-14 NOTE — Progress Notes (Signed)
Physical Therapy Treatment Patient Details Name: Michael Ellison MRN: [id number] DOB: 05/01/1946 Today's Date: 03/14/2012 Time: 4098-1191 PT Time Calculation (min): 28 min  PT Assessment / Plan / Recommendation Comments on Treatment Session  Patient s/p SB resection with fascial dehiscence with VDRF and resultant trach.  Will continue to work on addressing patient's strength and endurance for activity.      Follow Up Recommendations  Skilled nursing facility;Supervision/Assistance - 24 hour    Barriers to Discharge  None      Equipment Recommendations  Defer to next venue    Recommendations for Other Services  None  Frequency Min 3X/week   Plan Discharge plan needs to be updated;Frequency remains appropriate    Precautions / Restrictions Precautions Precautions: Fall   Pertinent Vitals/Pain VSS/ No c/o pain but noted patient pushed his PCA prior to getting OOB    Mobility  Bed Mobility Bed Mobility: Rolling Right;Right Sidelying to Sit;Sitting - Scoot to [place] of Bed Rolling Right: 4: Min assist;With rail Right Sidelying to Sit: 4: Min assist;With rails;HOB flat Supine to Sit: Not tested (comment) Sitting - Scoot to Edge of Bed: 5: Supervision Sit to Supine: Not Tested (comment) Details for Bed Mobility Assistance: +2 assist needed for lines.  Cues for technique Transfers Transfers: Sit to Stand;Stand to Sit Sit to Stand: 4: Min guard;With upper extremity assist;From bed Stand to Sit: 4: Min guard;With upper extremity assist;To chair/3-in-1 Stand Pivot Transfers: Not tested (comment) Details for Transfer Assistance: Patient needed verbal cues for hand placement Ambulation/Gait Ambulation/Gait Assistance: 4: Min assist (+ 1 for lines and chair) Ambulation Distance (Feet): 45 Feet Assistive device: Rolling walker Ambulation/Gait Assistance Details: Patient with some unsteadiness noted with turns, overall fairly steady.  Limited distance due to fatigue.   Gait Pattern:  Step-through pattern;Decreased step length - right;Decreased step length - left;Decreased stride length Gait velocity: slow cadence Stairs: No Wheelchair Mobility Wheelchair Mobility: No     PT Goals Acute Rehab PT Goals PT Goal: Supine/Side to Sit - Progress: Progressing toward goal PT Goal: Sit at [place] Of Bed - Progress: Progressing toward goal PT Goal: Sit to Stand - Progress: Progressing toward goal PT Goal: Stand to Sit - Progress: Progressing toward goal PT Transfer Goal: Bed to Chair/Chair to Bed - Progress: Progressing toward goal PT Goal: Ambulate - Progress: Progressing toward goal  Visit Information  Last PT Received On: 03/14/12 Assistance Needed: +2 (Incr lines)    Subjective Data  Subjective: "I will try to walk"   Cognition  Overall Cognitive Status: Appears within functional limits for tasks assessed/performed Difficult to assess due to: Tracheostomy Arousal/Alertness: Awake/alert Orientation Level: Appears intact for tasks assessed Behavior During Session: [facility] for tasks performed    Balance  Static Sitting Balance Static Sitting - Balance Support: No upper extremity supported;Feet supported Static Sitting - Level of Assistance: 6: Modified independent (Device/Increase time) Static Sitting - Comment/# of Minutes: 5 Static Standing Balance Static Standing - Balance Support: Bilateral upper extremity supported;During functional activity Static Standing - Level of Assistance: 4: Min assist  End of Session PT - End of Session Equipment Utilized During Treatment: Gait belt Activity Tolerance: Patient tolerated treatment well Patient left: in chair;with call bell/phone within reach;with family/visitor present Nurse Communication: Mobility status    INGOLD,Daryll Spisak 03/14/2012, 2:45 PM [employer] [employer] [phone] (pager)

## 2012-03-15 LAB — BASIC METABOLIC PANEL
CO2: 24 mEq/L (ref 19–32)
Calcium: 8.4 mg/dL (ref 8.4–10.5)
GFR calc non Af Amer: >90 mL/min (ref >90)
Sodium: 136 mEq/L (ref 135–145)

## 2012-03-15 LAB — GLUCOSE, CAPILLARY
Glucose-Capillary: 122 mg/dL — ABNORMAL HIGH (ref 70–99)
Glucose-Capillary: 104 mg/dL — ABNORMAL HIGH (ref 70–99)
Glucose-Capillary: 108 mg/dL — ABNORMAL HIGH (ref 70–99)

## 2012-03-15 MED ORDER — FAT EMULSION 20 % IV EMUL
250.0000 mL | INTRAVENOUS | Status: AC
  Administered 2012-03-15: 250 mL via INTRAVENOUS
  Filled 2012-03-15: qty 250

## 2012-03-15 MED ORDER — FENTANYL CITRATE 0.05 MG/ML IJ SOLN
25.0000 ug | INTRAMUSCULAR | Status: DC | PRN
  Administered 2012-03-15 – 2012-03-21 (×14): 50 ug via INTRAVENOUS
  Administered 2012-03-21: 25 ug via INTRAVENOUS
  Administered 2012-03-22 – 2012-03-31 (×20): 50 ug via INTRAVENOUS
  Administered 2012-03-31: 10:00:00 via INTRAVENOUS
  Administered 2012-03-31 – 2012-04-08 (×22): 50 ug via INTRAVENOUS
  Filled 2012-03-15 (×63): qty 2

## 2012-03-15 MED ORDER — TRACE MINERALS CR-CU-MN-SE-ZN 10-1000-500-60 MCG/ML IV SOLN
INTRAVENOUS | Status: AC
  Administered 2012-03-15: 17:00:00 via INTRAVENOUS
  Filled 2012-03-15: qty 2400

## 2012-03-15 NOTE — Progress Notes (Signed)
Patient ID: Michael Ellison, male   DOB: 07/07/1946, 66 y.o.   MRN: [id number] 13 Days Post-Op   Subjective: Patient fitted for Passy-Muir valve yesterday.  Tube feeds started.  Abdomen sore, but tolerable.    Objective: Vital signs in last 24 hours: Temp:  [97.2 F (36.2 C)-98.8 F (37.1 C)] 97.2 F (36.2 C) (05/29 0824) Pulse Rate:  [84-99] 90  (05/29 0824) Resp:  [20-37] 20  (05/29 0824) BP: (110-139)/(69-94) 120/70 mmHg (05/29 0824) SpO2:  [40 %-100 %] 96 % (05/29 0824) FiO2 (%):  [35 %-95 %] 40 % (05/29 0820) Weight:  [165 lb 2 oz (74.9 kg)] 165 lb 2 oz (74.9 kg) (05/28 2357) Last BM Date: 03/12/12  Intake/Output from previous day: 05/28 0701 - 05/29 0700 In: 2990 [I.V.:260; NG/GT:160; IV Piggyback:50; TPN:2520] Out: 1350 [Urine:1200; Emesis/NG output:150] Intake/Output this shift: Total I/O In: 20 [I.V.:20] Out: 200 [Urine:200]  General appearance: alert, cooperative and no distress GI: soft, mildly tender.  Retention sutures in place.   VAC with good seal  Lab Results:   Basename 03/14/12 0435 03/13/12 0500  WBC 12.1* 14.5*  HGB 9.8* 10.2*  HCT 30.9* 31.8*  PLT 402* 443*   BMET  Basename 03/15/12 0500 03/14/12 0435  NA 136 138  K 4.0 3.9  CL 105 106  CO2 24 26  GLUCOSE 100* 110*  BUN 23 26*  CREATININE 0.69 0.76  CALCIUM 8.4 8.4   PT/INR No results found for this basename: LABPROT:2,INR:2 in the last 72 hours ABG No results found for this basename: PHART:2,PCO2:2,PO2:2,HCO3:2 in the last 72 hours  Studies/Results: No results found.  Anti-infectives: Anti-infectives     Start     Dose/Rate Route Frequency Ordered Stop   03/03/12 1800   vancomycin (VANCOCIN) 1,750 mg in sodium chloride 0.9 % 500 mL IVPB  Status:  Discontinued        1,750 mg 250 mL/hr over 120 Minutes Intravenous Every 12 hours 03/03/12 0628 03/03/12 0918   02/29/12 1900   micafungin (MYCAMINE) 100 mg in sodium chloride 0.9 % 100 mL IVPB  Status:  Discontinued        100  mg 100 mL/hr over 1 Hours Intravenous Every 24 hours 02/29/12 1754 03/01/12 0933   02/29/12 1730   vancomycin (VANCOCIN) 1,250 mg in sodium chloride 0.9 % 250 mL IVPB  Status:  Discontinued        1,250 mg 166.7 mL/hr over 90 Minutes Intravenous Every 12 hours 02/29/12 1644 03/03/12 0628   02/29/12 1715   vancomycin (VANCOCIN) IVPB 1000 mg/200 mL premix  Status:  Discontinued        1,000 mg 200 mL/hr over 60 Minutes Intravenous  Once 02/29/12 1637 02/29/12 1647   02/29/12 1645   piperacillin-tazobactam (ZOSYN) IVPB 3.375 g  Status:  Discontinued        3.375 g 100 mL/hr over 30 Minutes Intravenous  Once 02/29/12 1637 02/29/12 1641   02/28/12 1800   piperacillin-tazobactam (ZOSYN) IVPB 3.375 g  Status:  Discontinued        3.375 g 12.5 mL/hr over 240 Minutes Intravenous 4 times per day 02/28/12 1454 02/28/12 1510   02/28/12 1800   piperacillin-tazobactam (ZOSYN) IVPB 3.375 g  Status:  Discontinued        3.375 g 12.5 mL/hr over 240 Minutes Intravenous Every 8 hours 02/28/12 1510 03/14/12 1700   02/25/12 1109   dextrose 5 % with cefOXitin (MEFOXIN) ADS Med     Comments: HOLLIE, CHRISTINA: cabinet override  02/25/12 1109 02/25/12 2314   02/25/12 1100   cefOXitin (MEFOXIN) 1 g in dextrose 5 % 50 mL IVPB  Status:  Discontinued        1 g 100 mL/hr over 30 Minutes Intravenous 60 min pre-op 02/25/12 1100 02/25/12 1542          Assessment/Plan: s/p Procedure(s) (LRB): DEBRIDEMENT CLOSURE/ABDOMINAL WOUND (N/A)  Would pull NGT, eval swallowing.  OK to do full liquids if passes swallow, otherwise, could place Panda and resume tube feeds. Vac changes.   LOS: 20 days    Jeaneen Cala 03/15/2012

## 2012-03-15 NOTE — Progress Notes (Addendum)
Name: Michael Ellison MRN: [id number] DOB: Nov 13, 1945    LOS: 20  PULMONARY / CRITICAL CARE MEDICINE  Pt Profile:   66 yo male smoker admitted to [facility] 02/24/2012 with n/v and abdominal pain due to SBO due to adhesions.  Had ex lap with SB resection 5/10.  Developed delirium post-op.  Transferred to [facility] 5/14 for respiratory failure requiring intubation.  Developed fascial dehiscence and taken back to OR 5/16.  Failed extubation, and required tracheostomy 5/22. PMHx ETOH, HTN, Prostate cancer   Interval hx:  5/16: OR for fascial dehiscence repair- closed with wound vac 5/18- failed weaning 5/19- extubated 5/21- re-intubated for hypercarbia 5/22-improved co2 and neurostatus, pos almost 2 liters 5/22 SVT>>resolved spontaneously  Subjective: C/o abd pain.  Denies SOB.  Tol ATC and PMV.   Vital Signs: Temp:  [97.2 F (36.2 C)-98.8 F (37.1 C)] 97.2 F (36.2 C) (05/29 0824) Pulse Rate:  [84-99] 90  (05/29 0824) Resp:  [20-37] 20  (05/29 0824) BP: (110-139)/(69-94) 120/70 mmHg (05/29 0824) SpO2:  [40 %-100 %] 96 % (05/29 0824) FiO2 (%):  [35 %-95 %] 40 % (05/29 0820) Weight:  [165 lb 2 oz (74.9 kg)] 165 lb 2 oz (74.9 kg) (05/28 2357)  Physical Examination: General - pleasant male, NAD in bed   HEENT - trach site clean Cardiac - s1s2 regular, no murmur Chest - few rhonchi, no wheeze.  Abd - midline open wound with wound vac in place, tender with mild palpation, +bs Ext - no edema Neuro - follows commands, alert  Lab Results  Component Value Date   WBC 12.1* 03/14/2012   HGB 9.8* 03/14/2012   HCT 30.9* 03/14/2012   MCV 91.2 03/14/2012   PLT 402* 03/14/2012    BMET    Component Value Date/Time   NA 136 03/15/2012 0500   K 4.0 03/15/2012 0500   CL 105 03/15/2012 0500   CO2 24 03/15/2012 0500   GLUCOSE 100* 03/15/2012 0500   BUN 23 03/15/2012 0500   CREATININE 0.69 03/15/2012 0500   CALCIUM 8.4 03/15/2012 0500   GFRNONAA >90 03/15/2012 0500   GFRAA >90 03/15/2012 0500    Lab  Results  Component Value Date   ALT 25 03/13/2012   AST 26 03/13/2012   ALKPHOS 109 03/13/2012   BILITOT 1.4* 03/13/2012    Intake/Output Summary (Last 24 hours) at 03/15/12 0912 Last data filed at 03/15/12 0800  Gross per 24 hour  Intake   2790 ml  Output   1450 ml  Net   1340 ml   No results found.  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  Failed extubation, and s/p trach 5/22.   Plan: -Continue ATc as tolerated --day & night -f/u CXR, monitor pleural effusions -continue schedule BD's   CARDIOVASCULAR  left [place] CVL 5/14>>> Rt femoral aline 5/14>>>out  Septic/hypovolemic shock 2nd to SBO complicated by wound dehisence>>resolved.  HTN, Edema, Intermittent tachycardia Plan: -continue  lasix 40 to keep in negative fluid balance -re-added metoprolol 5/23>>monitor for bradycardia -prn hydralazine  RENAL  Hypokalemia, hypernatremia resolved.   GASTROINTESTINAL  5/09 CT abd/pelvis>>SBO, RLQ transition point, small Rt inguinal hernia, diverticulosis 5/14 CT abd/pelvis>>ileus ABD Wound Vac 5/16>>  SBO s/p lap with SB resection 5/10 complicated by post-op ileus and fascial dehiscence, and return to OR 5/16. Plan: -post-op care per CCS- D/w Dr. Donell Beers - ok for swallow eval and po intake  -cont TF x 24h  -dc TNA once  tolerates PO   HEMATOLOGIC  Anemia of critical illness.  Plan:  -transfuse for Hb < 7 -f/u cbc   INFECTIOUS  Cultures: BCX2 5/14 >>1/2 gram neg rod>>> 1/2 Klebsiella  UC 5/14 >>Enterococcus (sensitive to ampicillin) Sputum 5/14>> Klebsiella pneumoniae (pan sensitive) Wound 5/14>>Klebsiella  Pneumoniae (pan sensitive) & Enterobacter Cloacae (resistant to ancef, cefoxitin) BC X2 5/20>>>ng Sputum 5/22>>>klebs, enterobacter  Antibiotics: vanc 5/14 >> 5/17 Zosyn 5/13 >>5/28  Wound infection, aspiration pneumonia, UTI. Plan: -monitor off abx    ENDOCRINE CBG (last 3)   Basename 03/15/12 0514 03/15/12 0121 03/14/12 1736  GLUCAP 104* 122* 83   Hyperglycemia. Plan: -SSI  NEUROLOGIC  Acute metabolic encephalopathy 2nd to sepsis, respiratory failure, post-op pain, and ETOH.  Much improved.  Intolerant of lorazepam (does okay with midazolam). Plan: -dc fentanyl PCA & use intermittent for pain control -limit benzo's and haldol -pt/ot when ok with CCS    DISPO - has no LTAC benefits, plan for SNF/ rehab  [facility], NP 03/15/2012  9:12 AM Pager: [phone]  *Care during the described time interval was provided by me and/or other providers on the critical care team. I have reviewed this patient's available data, including medical history, events of note, physical examination and test results as part of my evaluation.   Ingris Pasquarella V.  [phone]

## 2012-03-15 NOTE — Progress Notes (Signed)
PARENTERAL NUTRITION CONSULT NOTE - FOLLOW UP   Pharmacy Consult for TPN Indication: Prolonged ileus s/p SBR for SBO 5/10  Allergies  Allergen Reactions  . Lorazepam Other (See Comments)    Severe agitated delirium. Tolerates midazolam and other benzo's    Patient Measurements: Height: 6\' 2"  (188 cm) Weight: 165 lb 2 oz (74.9 kg) IBW/kg (Calculated) : 82.2  Usual Weight: ~82 kg  Vital Signs: Temp: 98.5 F (36.9 C) (05/29 0430) Temp src: Oral (05/29 0430) BP: 118/72 mmHg (05/29 0700) Pulse Rate: 84  (05/29 0733) Intake/Output from previous day: 05/28 0701 - 05/29 0700 In: 2990 [I.V.:260; NG/GT:160; IV Piggyback:50; TPN:2520] Out: 1350 [Urine:1200; Emesis/NG output:150] Intake/Output from this shift:    Labs:  Basename 03/14/12 0435 03/13/12 0500  WBC 12.1* 14.5*  HGB 9.8* 10.2*  HCT 30.9* 31.8*  PLT 402* 443*  APTT -- --  INR -- --     Basename 03/15/12 0500 03/14/12 0435 03/13/12 0500  NA 136 138 141  K 4.0 3.9 3.7  CL 105 106 107  CO2 24 26 25   GLUCOSE 100* 110* 100*  BUN 23 26* 29*  CREATININE 0.69 0.76 0.82  LABCREA -- -- --  CREAT24HRUR -- -- --  CALCIUM 8.4 8.4 8.4  MG -- 2.0 2.1  PHOS -- 2.8 2.9  PROT -- -- 7.2  ALBUMIN -- -- 1.8*  AST -- -- 26  ALT -- -- 25  ALKPHOS -- -- 109  BILITOT -- -- 1.4*  BILIDIR -- -- --  IBILI -- -- --  PREALBUMIN -- -- 12.5*  TRIG -- 100 99  CHOLHDL -- -- --  CHOL -- -- 82   Estimated Creatinine Clearance: 97.5 ml/min (by C-G formula based on Cr of 0.69).    Basename 03/15/12 0514 03/15/12 0121 03/14/12 1736  GLUCAP 104* 122* 83    Insulin Requirements in the past 24 hours:  0 units SSI  Nutritional Goals:  1900-2000 kCal, 105-120 grams of protein per day, 2-2.3L  Current Nutrition:  Clinimix E 5/20 at 100 ml/hr + lipids 33ml/hr M/W/F providing 1910 kcal; 120 gm protein providing 100% of kcal and 100% protein needs.   Began tube feeds 5/28 to advance to goal of 1928 calories, 110gm  protein  Assessment: 66 y.o. male on TPN for prolonged ileus s/p SBR for SBO on 5/10 at [facility]. Noted PMH of alcohol abuse. TPN initiated 03/01/12. Tube feeds initiated 5/28 and to wean off TPN with advancement of enteral nutrition. Prealbumin now up to 12.5 (goal >17). Likely low d/t stress of illness, adjusted formula to provide more protein.   GI: SBO s/p resection 5/10 at [facility], ileus on bowel rest. NG output - 160ml/24h (stable). S/p OR 5/16 for debridement/abd closure. VAC changes MWF. Bisacodyl supp given 5/26 and had BMx2. Tube feeds via NGT begun 5/28 and with no residuals thus far per nurse. Current rate at 11ml/hr to advance to 40.  Endo: No h/o DM. CBGs well controlled requiring little insulin.  Lytes: All WNL, including Corr Ca= 10.16 Renal: SCr WNL/stable, UOP 0.7 ml/kg/hr Pulm: Trach placed 5/22, trach collar as tolerates and vent PRN. Cards:  hx HTN. VSS on IV metoprolol q8h. Furosemide added 5/28.  Hepatobil: AST/ALT ok, tbili improved from prev (2.6 ->1.4). Trig WNL, prealbumin up to 12.5.  Neuro: acute encephalopathy (ativan allergy); requiring fent PCA ID: Completed 16 day course of Zosyn for Kleb bacteremia (repeat BC=NG).  Afebrile, WBC 12.1. Best Practices:  Lovenox, PPI IV, MC  Plan:  - Continue Clinimix E 5/15 at 112ml/hr, will not adjust for tube feeds at this time for plan of d/c NGT/tube feeds and evaluating swallow  - Continue CBG checks at q6h - Multivitamins, trace elements, and lipids on M/W/F due to national shortage. - Thiamine and folic acid daily in TPN - Continue to trend prealbumin - F/u labs in the AM  Thank you,  Brett Fairy, PharmD Pager: [phone]  03/15/2012 7:48 AM

## 2012-03-15 NOTE — Progress Notes (Signed)
Clinical Social Worker received notification from [facility] that pt's dtr, Tamika, requested to speak with CSW.  CSW phoned [phone] and addressed each question/concern.  Dtr requested additional information on the SNF process.  CSW to continue to follow and assist as needed.   Angelia Mould, MSW, [place] [phone]

## 2012-03-15 NOTE — Progress Notes (Signed)
50 mcg of fentanyl IV from PCA pump wasted in the sink, witnessed by Sharyl Nimrod, Charity fundraiser.

## 2012-03-15 NOTE — Progress Notes (Signed)
Nutrition Follow-up  Per MD, pt to start TFs and continue TNA per CCS, if tolerated will taper TNA.  Tube feeding was initiated this morning. Swallow evaluation soon. Per surgery, ok to do full liquids if passes swallow, otherwise place Panda and resume tube feedings.  RN reports pt is currently receiving Osmolite 1.2 at 20 ml/hr and is tolerating well.  Diet Order:  NPO  TF: Osmolite 1.2 at 10 ml/h, increase by 10 ml every 8 hours to goal rate of 60 ml/h with Prostat 30 ml BID to provide 1928 kcals, 110 grams protein, 1181 ml free water daily.   Patient is receiving TPN with Clinimix E 5/15 @ 100 ml/hr.  Lipids (20% IVFE @ 10 ml/hr), multivitamins, and trace elements are provided 3 times weekly (MWF) due to national backorder.  Provides 1910 kcal and 120 grams protein daily (based on weekly average).  Meets 100% minimum estimated kcal and 100% minimum estimated protein needs.  Meds: Scheduled Meds:   . albuterol  6 puff Inhalation Q6H  . antiseptic oral rinse  15 mL Mouth Rinse QID  . chlorhexidine  15 mL Mouth Rinse BID  . enoxaparin  40 mg Subcutaneous Q24H  . furosemide  40 mg Per Tube Daily  . insulin aspart  0-15 Units Subcutaneous Q6H  . metoprolol  2.5 mg Intravenous Q8H  . pantoprazole (PROTONIX) IV  40 mg Intravenous QHS  . sodium chloride  10-40 mL Intracatheter Q12H  . DISCONTD: fentaNYL   Intravenous Q4H  . DISCONTD: piperacillin-tazobactam (ZOSYN)  IV  3.375 g Intravenous Q8H   Continuous Infusions:   . sodium chloride 20 mL (03/15/12 0504)  . fat emulsion 250 mL (03/13/12 1906)  . fat emulsion    . feeding supplement (OSMOLITE 1.2 CAL) 1,000 mL (03/14/12 2029)  . TPN (CLINIMIX) +/- additives 100 mL/hr at 03/14/12 1748  . TPN (CLINIMIX) +/- additives 100 mL/hr at 03/13/12 1906  . TPN (CLINIMIX) +/- additives     PRN Meds:.sodium chloride, albuterol, bisacodyl, fentaNYL, hydrALAZINE, sodium chloride, DISCONTD: diphenhydrAMINE, DISCONTD: diphenhydrAMINE, DISCONTD:  naloxone, DISCONTD: ondansetron (ZOFRAN) IV, DISCONTD: sodium chloride  Labs:  CMP     Component Value Date/Time   NA 136 03/15/2012 0500   K 4.0 03/15/2012 0500   CL 105 03/15/2012 0500   CO2 24 03/15/2012 0500   GLUCOSE 100* 03/15/2012 0500   BUN 23 03/15/2012 0500   CREATININE 0.69 03/15/2012 0500   CALCIUM 8.4 03/15/2012 0500   PROT 7.2 03/13/2012 0500   ALBUMIN 1.8* 03/13/2012 0500   AST 26 03/13/2012 0500   ALT 25 03/13/2012 0500   ALKPHOS 109 03/13/2012 0500   BILITOT 1.4* 03/13/2012 0500   GFRNONAA >90 03/15/2012 0500   GFRAA >90 03/15/2012 0500   Lipid Panel     Component Value Date/Time   CHOL 82 03/13/2012 0500   TRIG 100 03/14/2012 0435     Intake/Output Summary (Last 24 hours) at 03/15/12 1056 Last data filed at 03/15/12 1034  Gross per 24 hour  Intake   2970 ml  Output   1650 ml  Net   1320 ml    Weight Status:  74.9 kg - stable  Body mass index is 21.20 kg/(m^2). WNL  Estimated needs:  1900-2000 kcals, 105-120 grams protein daily  Nutrition Dx:  Inadequate oral intake, ongoing  Goal:  Intake to meet 90-100% of estimated nutrition needs, met  Intervention:  Currently tolerating Osmolite 1.2 at 20 ml/hr. Continue to advance to goal of 60 ml/hr if  unable to pass swallow evaluation soon. Taper TPN per MD and Pharmacy discretion.  Monitor:  Weights, labs, PO intake, I/O's  Adair Laundry Pager #:  [phone]

## 2012-03-15 NOTE — Progress Notes (Signed)
OT Cancellation Note  ___Treatment cancelled today due to medical issues with patient which prohibited therapy  ___ Treatment cancelled today due to patient receiving procedure or test   ___ Treatment cancelled today due to patient's refusal to participate   _x__ Treatment cancelled today per RN. Stated pt was very fatigued from spending several hours OOB. Asked OT to check back tomorrow.   Signature: Garrel Ridgel, OTR/L  Pager [phone] 03/15/2012

## 2012-03-16 LAB — COMPREHENSIVE METABOLIC PANEL
ALT: 35 U/L (ref 0–53)
AST: 29 U/L (ref 0–37)
CO2: 24 mEq/L (ref 19–32)
Chloride: 102 mEq/L (ref 96–112)
GFR calc non Af Amer: >90 mL/min (ref >90)
Sodium: 135 mEq/L (ref 135–145)
Total Bilirubin: 0.6 mg/dL (ref 0.3–1.2)

## 2012-03-16 LAB — CBC
HCT: 31.2 % — ABNORMAL LOW (ref 39.0–52.0)
MCHC: 32.4 g/dL (ref 30.0–36.0)
MCV: 90.2 fL (ref 78.0–100.0)
RDW: 14.5 % (ref 11.5–15.5)

## 2012-03-16 LAB — GLUCOSE, CAPILLARY
Glucose-Capillary: 114 mg/dL — ABNORMAL HIGH (ref 70–99)
Glucose-Capillary: 118 mg/dL — ABNORMAL HIGH (ref 70–99)
Glucose-Capillary: 99 mg/dL (ref 70–99)

## 2012-03-16 MED ORDER — THIAMINE HCL 100 MG/ML IJ SOLN
INTRAVENOUS | Status: DC
  Administered 2012-03-16: 18:00:00 via INTRAVENOUS
  Filled 2012-03-16: qty 2400

## 2012-03-16 NOTE — Progress Notes (Signed)
Occupational Therapy Treatment Patient Details Name: Michael Ellison MRN: [id number] DOB: August 10, 1946 Today's Date: 03/16/2012 Time: 4098-1191 OT Time Calculation (min): 34 min  OT Assessment / Plan / Recommendation Comments on Treatment Session Pt did very well this session. Ambulated ~225 ft with PT/OT. Pt did need to take several standing rest breaks.    Follow Up Recommendations  LTACH    Barriers to Discharge       Equipment Recommendations  Defer to next venue    Recommendations for Other Services    Frequency Min 2X/week   Plan Discharge plan remains appropriate    Precautions / Restrictions Restrictions Weight Bearing Restrictions: No   Pertinent Vitals/Pain     ADL  Toilet Transfer: Simulated;Min guard (+2 for lines) Toilet Transfer Method: Sit to stand Toileting - Clothing Manipulation and Hygiene: Performed;+1 Total assistance Where Assessed - Toileting Clothing Manipulation and Hygiene: Standing ADL Comments: Pt did very well today with major improvements in activity/standing tolerance. Ambulated the length of the hallway & back.    OT Diagnosis:    OT Problem List:   OT Treatment Interventions:     OT Goals ADL Goals ADL Goal: Toilet Transfer - Progress: Progressing toward goals ADL Goal: Toileting - Clothing Manipulation - Progress: Progressing toward goals ADL Goal: Toileting - Hygiene - Progress: Progressing toward goals  Visit Information  Last OT Received On: 03/16/12 Assistance Needed: +2 (for lines)    Subjective Data  Subjective: When asked how his FEEs went. Pt shook his head and mouthed not so well.   Prior Functioning       Cognition  Overall Cognitive Status: Appears within functional limits for tasks assessed/performed Difficult to assess due to: Tracheostomy Arousal/Alertness: Awake/alert Orientation Level: Appears intact for tasks assessed Behavior During Session: [facility] for tasks performed    Mobility Bed Mobility Supine to Sit:  4: Min assist Sitting - Scoot to Edge of Bed: 5: Supervision Details for Bed Mobility Assistance: min VCs for hand placement and technique. Transfers Sit to Stand: 4: Min guard;With upper extremity assist;From bed Stand to Sit: 4: Min assist;With upper extremity assist;To chair/3-in-1;With armrests Details for Transfer Assistance: min VCs for hand placement, posture during transfers and ambulation. +2 for ambulation due to lines.   Exercises    Balance Static Sitting Balance Static Sitting - Balance Support: No upper extremity supported;Feet supported Static Sitting - Level of Assistance: 6: Modified independent (Device/Increase time) Static Standing Balance Static Standing - Balance Support: Bilateral upper extremity supported;During functional activity Static Standing - Level of Assistance: 4: Min assist  End of Session OT - End of Session Equipment Utilized During Treatment: Gait belt Activity Tolerance: Patient tolerated treatment well Patient left: in chair;with call bell/phone within reach;with family/visitor present   Treacy Holcomb A OTR/L [phone] 03/16/2012, 10:52 AM

## 2012-03-16 NOTE — Evaluation (Addendum)
Clinical/Bedside Swallow Evaluation Patient Details  Name: Michael Ellison MRN: [id number] Date of Birth: 06/27/1946  Today's Date: 03/16/2012 Time: 4098-1191 SLP Time Calculation (min): 25 min  Past Medical History:  Past Medical History  Diagnosis Date  . Hypertension   . Cancer   . Cancer of prostate    Past Surgical History:  Past Surgical History  Procedure Date  . Hernia repair   . Prostate biopsy   . Robotic prostate surgery 2011  . Laparotomy 02/25/2012    Procedure: EXPLORATORY LAPAROTOMY;  Surgeon: [name], MD;  Location: [facility];  Service: General;  Laterality: N/A;  . Bowel resection 02/25/2012    Procedure: SMALL BOWEL RESECTION;  Surgeon: [name], MD;  Location: [facility];  Service: General;;  . Wound debridement 03/02/2012    Procedure: DEBRIDEMENT CLOSURE/ABDOMINAL WOUND;  Surgeon: Cherylynn Ridges, MD;  Location: [facility] OR;  Service: General;  Laterality: N/A;   HPI:  66 yo male smoker admitted to [facility] 02/24/2012 with n/v and abdominal pain due to SBO due to adhesions.  Had ex lap with SB resection 5/10.  Developed delirium post-op.  Transferred to [facility] 5/14 for respiratory failure requiring intubation.  Developed fascial dehiscence and taken back to OR 5/16.  Failed extubation 5/19, reintubated and required tracheostomy 5/22.  Has been tolerating trach collar with PMSV with full supervision. Had large bore NG pulled today prior to assessment.    Assessment / Plan / Recommendation Clinical Impression  Pt presents with overt signs of weakness, decreased airway protection and residuals inferred from hard prolonged cough with expectoration, wet vocal quality and decreased elevation with mulitple swallows with minimal trials of thin liquids. Pt recently had NG tube removed and is without nutritional source. FEES warranted to objectively assess function and determine if pt can consume POs safely     Aspiration Risk  Severe    Diet Recommendation NPO   Other   Recommendations FEES   Follow Up Recommendations  Inpatient Rehab    Frequency and Duration        Pertinent Vitals/Pain Respiratory rate above 30.     SLP Swallow Goals     Swallow Study Prior Functional Status       General HPI: 66 yo male smoker admitted to [facility] 02/24/2012 with n/v and abdominal pain due to SBO due to adhesions.  Had ex lap with SB resection 5/10.  Developed delirium post-op.  Transferred to [facility] 5/14 for respiratory failure requiring intubation.  Developed fascial dehiscence and taken back to OR 5/16.  Failed extubation 5/19, reintubated and required tracheostomy 5/22.  Has been tolerating trach collar with PMSV with full supervision. Had large bore NG pulled today prior to assessment.  Type of Study: Bedside swallow evaluation Diet Prior to this Study: NPO;Other (Comment) (NG tube feeds) Temperature Spikes Noted: No Respiratory Status: Trach Trach Size and Type: Cuff;Deflated;#8;With PMSV in place History of Intubation: Yes Length of Intubations (days): 9 days Date extubated: 03/08/12 Behavior/Cognition: Alert;Cooperative (anxious) Oral Cavity - Dentition: Adequate natural dentition Vision: Functional for self-feeding Patient Positioning: Upright in bed Baseline Vocal Quality: Wet Volitional Cough: Strong Volitional Swallow: Able to elicit    Oral/Motor/Sensory Function Overall Oral Motor/Sensory Function: Appears within functional limits for tasks assessed   Ice Chips Ice chips: Impaired Presentation: Spoon Oral Phase Functional Implications: Oral residue Pharyngeal Phase Impairments: Delayed Swallow;Decreased hyoid-laryngeal movement;Wet Vocal Quality (multiple swallows)   Thin Liquid Thin Liquid: Impaired Presentation: Cup Oral Phase Functional Implications: Prolonged oral transit  Pharyngeal  Phase Impairments: Decreased hyoid-laryngeal movement;Delayed Swallow;Multiple swallows;Wet Vocal Quality;Throat Clearing - Immediate;Cough - Immediate    Nectar  Thick Nectar Thick Liquid: Not tested   Honey Thick Honey Thick Liquid: Not tested   Puree Puree: Not tested   Solid Solid: Not tested    Romani Wilbon, [name] 03/16/2012,10:21 AM    Passy-Muir Speaking Valve - Treatment Patient Details  Name: [name] MRN: [id number] Date of Birth: [birth year]  Today's Date: 03/16/2012 Time: 0830-0945 SLP Time Calculation (min): 75 min   Assessment / Plan / Recommendation Clinical Impression  Pt tachypneic at baseline, placement of PMSV results in immediate expectoration of copious secretions to the mouth with strong cough, excellent phonation at conversation level. However respiratory rate varied from 20 up to 40 with PMSV in place. Pt appears to tolerate valve well and that it does not greatly impact pts tachypnea as this is present at baseline.  Feel pt would benefit from PMSV placement during all waking hour to improve expectoration of secretions. Perhaps trach downsize to a 6 cuffed would improve respiratory rate with PMSV in place for increased expecotration of secretions with improved subglottic pressure. MD willing to downsize trach.     Plan  Continue with current plan of care    Follow Up Recommendations  Inpatient Rehab    Pertinent Vitals/Pain NA    SLP Goals Potential to Achieve Goals: Good Progress/Goals/Alternative treatment plan discussed with pt/caregiver and they: Agree SLP Goal #1: Pt will tolerate PMSV during all waking hours with intermittent supervision without distress.  SLP Goal #1 - Progress: Progressing toward goal SLP Goal #2: Pt will demonstrate independence with valve placement and removal with min verbal and visual cues.  SLP Goal #2 - Progress: Progressing toward goal   PMSV Trial  PMSV was placed for: 75 minutes Able to redirect subglottic air through upper airway: Yes Able to Attain Phonation: Yes Voice Quality: Wet Able to Expectorate Secretions: Yes Level of Secretion Expectoration with PMSV:  Oral Breath Support for Phonation: Adequate Intelligibility: Intelligible Respirations During Trial: 30  (up to 40 at times. also reached 15 while speaking to daughte) SpO2 During Trial: 100 % Behavior: Alert;Anxious;Expresses self well;Cooperative;Responsive to questions;Tense   Tracheostomy Tube  Additional Tracheostomy Tube Assessment Trach Collar Period: 12 hours Secretion Description: thin clear copious Frequency of Tracheal Suctioning: frequent Level of Secretion Expectoration: Tracheal    Vent Dependency  Vent Dependent: Yes FiO2 (%): 28 % Nocturnal Vent: Yes    Cuff Deflation Trial Tolerated Cuff Deflation: Yes Length of Time for Cuff Deflation Trial: 75 minutes Behavior: Alert;Anxious;Cooperative;Expresses self well   Alvita Fana, [name] 03/16/2012, 10:34 AM

## 2012-03-16 NOTE — Procedures (Signed)
Tracheostomy Change Note  Patient Details:   Name: Michael Ellison DOB: 1946/09/27 MRN: [id number]    Airway Documentation:  Janina Mayo changed from #8 cuffed Shiley, to #6 cuffed Shiley.  #8 removed and #6 inserted with no complications.  Positive color change noted via EasyCap and bilateral breath sounds were heard.  New dressing applied, and trach secured with new trach ties.  RT will continue to monitor.    Evaluation  O2 sats: stable throughout Complications: No apparent complications Patient did tolerate procedure well. Bilateral Breath Sounds: Rhonchi Suctioning: Airway--pt has strong, productive, spontaneous cough  Nassau Lions 03/16/2012, 3:06 PM

## 2012-03-16 NOTE — Progress Notes (Signed)
Attempted to insert the panda nasoenteric feeding tube, but pt was noted to have terrible gag reflex and coughed up massive thin secretion though the trach. Pt is now coughing up blood tinged sputum. MD/PA notified. Will attempt to insert the panda again later in the afternoon per MD order. Jeweldean Drohan Djigbodi , RN.

## 2012-03-16 NOTE — Progress Notes (Signed)
Name: Michael Ellison MRN: [id number] DOB: May 21, 1946    LOS: 21  PULMONARY / CRITICAL CARE MEDICINE  Pt Profile:   66 yo male smoker admitted to [facility] 02/24/2012 with n/v and abdominal pain due to SBO due to adhesions.  Had ex lap with SB resection 5/10.  Developed delirium post-op.  Transferred to [facility] 5/14 for respiratory failure requiring intubation.  Developed fascial dehiscence and taken back to OR 5/16.  Failed extubation, and required tracheostomy 5/22. PMHx ETOH, HTN, Prostate cancer   Interval hx:  5/16: OR for fascial dehiscence repair- closed with wound vac 5/18- failed weaning 5/19- extubated 5/21- re-intubated for hypercarbia 5/22-improved co2 and neurostatus, pos almost 2 liters 5/22 SVT>>resolved spontaneously  Subjective: Abd pain improved.  Having FEES this am. Tachypneic with PMV   Vital Signs: Temp:  [97.9 F (36.6 C)-100.1 F (37.8 C)] 98.4 F (36.9 C) (05/30 0749) Pulse Rate:  [89-103] 93  (05/30 0749) Resp:  [22-35] 33  (05/30 0749) BP: (103-128)/(63-79) 118/79 mmHg (05/30 0749) SpO2:  [94 %-100 %] 98 % (05/30 0826) FiO2 (%):  [28 %-40 %] 28 % (05/30 0826) Weight:  [165 lb 5.5 oz (75 kg)] 165 lb 5.5 oz (75 kg) (05/30 0519)  Physical Examination: General - pleasant male, NAD in bed   HEENT - trach site clean Cardiac - s1s2 regular, no murmur Chest - few rhonchi, no wheeze.  Abd - midline open wound with wound vac in place, tender with mild palpation, +bs Ext - no edema Neuro - follows commands, alert  Lab Results  Component Value Date   WBC 12.4* 03/16/2012   HGB 10.1* 03/16/2012   HCT 31.2* 03/16/2012   MCV 90.2 03/16/2012   PLT 338 03/16/2012    BMET    Component Value Date/Time   NA 135 03/16/2012 0500   K 4.0 03/16/2012 0500   CL 102 03/16/2012 0500   CO2 24 03/16/2012 0500   GLUCOSE 138* 03/16/2012 0500   BUN 21 03/16/2012 0500   CREATININE 0.63 03/16/2012 0500   CALCIUM 8.3* 03/16/2012 0500   GFRNONAA >90 03/16/2012 0500   GFRAA >90 03/16/2012  0500    Lab Results  Component Value Date   ALT 35 03/16/2012   AST 29 03/16/2012   ALKPHOS 104 03/16/2012   BILITOT 0.6 03/16/2012    Intake/Output Summary (Last 24 hours) at 03/16/12 0932 Last data filed at 03/16/12 0700  Gross per 24 hour  Intake   3236 ml  Output    702 ml  Net   2534 ml   No results found.  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  Failed extubation, and s/p trach 5/22.   Plan: -Continue ATC as tolerated --day & night -f/u CXR, monitor pleural effusions -continue schedule BD's -downsize to #6 cuffed 5/30  CARDIOVASCULAR  left [place] CVL 5/14>>> Rt femoral aline 5/14>>>out  Septic/hypovolemic shock 2nd to SBO complicated by wound dehisence>>resolved.  HTN, Edema, Intermittent tachycardia Plan: -continue  lasix 40 to keep in negative fluid balance -re-added metoprolol 5/23>>monitor for bradycardia -prn hydralazine  RENAL  Hypokalemia, hypernatremia resolved. BMET    Component Value Date/Time   NA 135 03/16/2012 0500   K 4.0  03/16/2012 0500   CL 102 03/16/2012 0500   CO2 24 03/16/2012 0500   GLUCOSE 138* 03/16/2012 0500   BUN 21 03/16/2012 0500   CREATININE 0.63 03/16/2012 0500   CALCIUM 8.3* 03/16/2012 0500   GFRNONAA >90 03/16/2012 0500   GFRAA >90 03/16/2012 0500     GASTROINTESTINAL  5/09 CT abd/pelvis>>SBO, RLQ transition point, small Rt inguinal hernia, diverticulosis 5/14 CT abd/pelvis>>ileus ABD Wound Vac 5/16>>  SBO s/p lap with SB resection 5/10 complicated by post-op ileus and fascial dehiscence, and return to OR 5/16. Plan: -post-op care per [facility]- D/w Dr. Donell Beers - ok for swallow  eval and po intake  -cont TF x 24h  -for FEES swallow eval per ST 5/30 -- place panda since he failed -wean off TNA once tolerates PO/TF   HEMATOLOGIC  Anemia of critical illness.  Plan:  -transfuse for Hb < 7 -f/u cbc   INFECTIOUS  Cultures: BCX2 5/14 >>1/2 gram neg rod>>> 1/2 Klebsiella  UC 5/14 >>Enterococcus (sensitive to ampicillin) Sputum 5/14>> Klebsiella pneumoniae (pan sensitive) Wound 5/14>>Klebsiella Pneumoniae (pan sensitive) & Enterobacter Cloacae (resistant to ancef, cefoxitin) BC X2 5/20>>>ng Sputum 5/22>>>klebs, enterobacter  Antibiotics: vanc 5/14 >> 5/17 Zosyn 5/13 >>5/28  Wound infection, aspiration pneumonia, UTI. Plan: -monitor off abx    ENDOCRINE CBG (last 3)   Basename 03/16/12 0630 03/16/12 0021 03/15/12 1833  GLUCAP 114* 87 108*   Hyperglycemia. Plan: -SSI  NEUROLOGIC  Acute metabolic encephalopathy 2nd to sepsis, respiratory failure, post-op pain, and ETOH.  Much improved.  Intolerant of lorazepam (does okay with midazolam). Plan: -PRN fentanyl for pain  -limit benzo's and haldol -pt/ot when ok with [facility]    DISPO - has no LTAC benefits, plan for SNF/ rehab  [facility], NP 03/16/2012  9:32 AM Pager: [phone]   Independently examined pt, evaluated data & formulated above care plan with NP  [facility].

## 2012-03-16 NOTE — Progress Notes (Signed)
Patient ID: Michael Ellison, male   DOB: 1946-02-21, 66 y.o.   MRN: [id number] 14 Days Post-Op   Subjective: Doing well with passy-muir.  Trach downsized.  Pt had swallowing eval and significant dysphagia.  Getting tube feeds at 40 mL/hr.  Objective: Vital signs in last 24 hours: Temp:  [97.9 F (36.6 C)-100.1 F (37.8 C)] 98.5 F (36.9 C) (05/30 1220) Pulse Rate:  [89-100] 100  (05/30 1308) Resp:  [22-35] 24  (05/30 1308) BP: (104-120)/(73-79) 120/74 mmHg (05/30 1308) SpO2:  [96 %-100 %] 98 % (05/30 1501) FiO2 (%):  [28 %-40 %] 28 % (05/30 1501) Weight:  [165 lb 5.5 oz (75 kg)] 165 lb 5.5 oz (75 kg) (05/30 0519) Last BM Date: 03/16/12  Intake/Output from previous day: 05/29 0701 - 05/30 0700 In: 3476 [I.V.:490; NG/GT:450; TPN:2536] Out: 902 [Urine:900; Stool:2] Intake/Output this shift: Total I/O In: -  Out: 500 [Urine:500]  General appearance: alert, cooperative and no distress GI: soft, mildly tender.  Retention sutures in place.   VAC with good seal  Lab Results:   Basename 03/16/12 0500 03/14/12 0435  WBC 12.4* 12.1*  HGB 10.1* 9.8*  HCT 31.2* 30.9*  PLT 338 402*   BMET  Basename 03/16/12 0500 03/15/12 0500  NA 135 136  K 4.0 4.0  CL 102 105  CO2 24 24  GLUCOSE 138* 100*  BUN 21 23  CREATININE 0.63 0.69  CALCIUM 8.3* 8.4   PT/INR No results found for this basename: LABPROT:2,INR:2 in the last 72 hours ABG No results found for this basename: PHART:2,PCO2:2,PO2:2,HCO3:2 in the last 72 hours  Studies/Results: No results found.  Anti-infectives: Anti-infectives     Start     Dose/Rate Route Frequency Ordered Stop   03/03/12 1800   vancomycin (VANCOCIN) 1,750 mg in sodium chloride 0.9 % 500 mL IVPB  Status:  Discontinued        1,750 mg 250 mL/hr over 120 Minutes Intravenous Every 12 hours 03/03/12 0628 03/03/12 0918   02/29/12 1900   micafungin (MYCAMINE) 100 mg in sodium chloride 0.9 % 100 mL IVPB  Status:  Discontinued        100 mg 100 mL/hr  over 1 Hours Intravenous Every 24 hours 02/29/12 1754 03/01/12 0933   02/29/12 1730   vancomycin (VANCOCIN) 1,250 mg in sodium chloride 0.9 % 250 mL IVPB  Status:  Discontinued        1,250 mg 166.7 mL/hr over 90 Minutes Intravenous Every 12 hours 02/29/12 1644 03/03/12 0628   02/29/12 1715   vancomycin (VANCOCIN) IVPB 1000 mg/200 mL premix  Status:  Discontinued        1,000 mg 200 mL/hr over 60 Minutes Intravenous  Once 02/29/12 1637 02/29/12 1647   02/29/12 1645   piperacillin-tazobactam (ZOSYN) IVPB 3.375 g  Status:  Discontinued        3.375 g 100 mL/hr over 30 Minutes Intravenous  Once 02/29/12 1637 02/29/12 1641   02/28/12 1800   piperacillin-tazobactam (ZOSYN) IVPB 3.375 g  Status:  Discontinued        3.375 g 12.5 mL/hr over 240 Minutes Intravenous 4 times per day 02/28/12 1454 02/28/12 1510   02/28/12 1800   piperacillin-tazobactam (ZOSYN) IVPB 3.375 g  Status:  Discontinued        3.375 g 12.5 mL/hr over 240 Minutes Intravenous Every 8 hours 02/28/12 1510 03/14/12 1700   02/25/12 1109   dextrose 5 % with cefOXitin (MEFOXIN) ADS Med     Comments: HOLLIE, CHRISTINA:  cabinet override         02/25/12 1109 02/25/12 2314   02/25/12 1100   cefOXitin (MEFOXIN) 1 g in dextrose 5 % 50 mL IVPB  Status:  Discontinued        1 g 100 mL/hr over 30 Minutes Intravenous 60 min pre-op 02/25/12 1100 02/25/12 1542          Assessment/Plan: s/p Procedure(s) (LRB): DEBRIDEMENT CLOSURE/ABDOMINAL WOUND (N/A)  RNs unable to get Panda tube.   Tube feeds advance as tolerated.      LOS: 21 days    Charlina Dwight 03/16/2012

## 2012-03-16 NOTE — Progress Notes (Signed)
PARENTERAL NUTRITION CONSULT NOTE - FOLLOW UP   Pharmacy Consult for TPN Indication: Prolonged ileus s/p SBR for SBO 5/10  Allergies  Allergen Reactions  . Lorazepam Other (See Comments)    Severe agitated delirium. Tolerates midazolam and other benzo's    Patient Measurements: Height: 6\' 2"  (188 cm) Weight: 165 lb 5.5 oz (75 kg) IBW/kg (Calculated) : 82.2  Usual Weight: ~82 kg  Vital Signs: Temp: 97.9 F (36.6 C) (05/30 0519) Temp src: Axillary (05/30 0519) BP: 118/79 mmHg (05/30 0723) Pulse Rate: 89  (05/30 0723) Intake/Output from previous day: 05/29 0701 - 05/30 0700 In: 3476 [I.V.:490; NG/GT:450; TPN:2536] Out: 902 [Urine:900; Stool:2] Intake/Output from this shift:    Labs:  Basename 03/16/12 0500 03/14/12 0435  WBC 12.4* 12.1*  HGB 10.1* 9.8*  HCT 31.2* 30.9*  PLT 338 402*  APTT -- --  INR -- --     Basename 03/16/12 0500 03/15/12 0500 03/14/12 0435  NA 135 136 138  K 4.0 4.0 3.9  CL 102 105 106  CO2 24 24 26   GLUCOSE 138* 100* 110*  BUN 21 23 26*  CREATININE 0.63 0.69 0.76  LABCREA -- -- --  CREAT24HRUR -- -- --  CALCIUM 8.3* 8.4 8.4  MG 1.8 -- 2.0  PHOS 3.0 -- 2.8  PROT 6.9 -- --  ALBUMIN 1.8* -- --  AST 29 -- --  ALT 35 -- --  ALKPHOS 104 -- --  BILITOT 0.6 -- --  BILIDIR -- -- --  IBILI -- -- --  PREALBUMIN -- -- --  TRIG -- -- 100  CHOLHDL -- -- --  CHOL -- -- --   Estimated Creatinine Clearance: 97.7 ml/min (by C-G formula based on Cr of 0.63).    Basename 03/16/12 0630 03/16/12 0021 03/15/12 1833  GLUCAP 114* 87 108*    Insulin Requirements in the past 24 hours:  2 units SSI  Nutritional Goals:  1900-2000 kCal, 105-120 grams of protein per day, 2-2.3L  Current Nutrition:  Clinimix E 5/20 at 100 ml/hr + lipids 6ml/hr M/W/F providing 1910 kcal; 120 gm protein providing 100% of kcal and 100% protein needs.   Began tube feeds 5/28 to advance to goal of 1928 calories, 110gm protein  Assessment: 66 y.o. male on TPN for  prolonged ileus s/p SBR for SBO on 5/10 at [facility]. Noted PMH of alcohol abuse. TPN initiated 03/01/12. Tube feeds initiated 5/28 and to wean off TPN with advancement of enteral nutrition. Swallow evaluation to occur today. If he does not pass, panda to be placed. Prealbumin now up to 12.5 (goal >17). Likely low d/t stress of illness, adjusted formula to provide more protein. Noted 2 BM 5/29.  GI: SBO s/p resection 5/10 at [facility], ileus on bowel rest. S/p OR 5/16 for debridement/abd closure. Patient with no NG output.Marland Kitchen VAC changes MWF. Tube feeds via NGT begun 5/28 and with no residuals thus far per nurse. Tolerating 13ml/hr with goal advancement to 54ml/hr. Swallow evaluation today to determine weaning of TPN.  Endo: No h/o DM. CBGs well controlled requiring little insulin and no insulin in bag. Lytes: All WNL, including Corr Ca= 10.06 Renal: SCr WNL/stable, UOP 0.5 ml/kg/hr Pulm: Trach placed 5/22, trach collar as tolerates and vent PRN. Cards:  hx HTN. VSS on IV metoprolol q8h. Furosemide added 5/28.  Hepatobil: AST/ALT ok, tbili improved from prev (2.6 ->1.4). Trig WNL, prealbumin up to 12.5.  Neuro: acute encephalopathy (ativan allergy); fentanyl prn  ID: Completed 16 day course of  Zosyn for Kleb bacteremia (repeat BC=NG).  Afebrile, WBC 12.4. Best Practices:  Lovenox, PPI IV, MC  Plan:  - Continue Clinimix E 5/15 at 128ml/hr, will not adjust at this time pending tolerance of tube feeds and results of swallow study; would have low threshold for d/c TPN if patient continues to tolerating enteral feeds/begins po intake.   - Continue CBG checks at q6h - Multivitamins, trace elements, and lipids on M/W/F due to national shortage. - Thiamine and folic acid daily in TPN - Continue to trend prealbumin - No tpn labs tomorrow  Thank you,  Brett Fairy, PharmD Pager: [phone]  03/16/2012 7:48 AM

## 2012-03-16 NOTE — Progress Notes (Signed)
Panda tube insertion failed again. MD/PA notified. New order in.

## 2012-03-16 NOTE — Progress Notes (Signed)
Clinical Social Worker reviewed chart and noted that pt is currently at 28% trach collar.  CSW phoned [facility] to inquire if this would meet their criteria for admissions.  CSW to continue to follow and assist as needed.    Angelia Mould, MSW, [place] [phone]

## 2012-03-16 NOTE — Procedures (Signed)
Objective Swallowing Evaluation: Fiberoptic Endoscopic Evaluation of Swallowing  Patient Details  Name: Michael Ellison MRN: [id number] Date of Birth: Mar 14, 1946  Today's Date: 03/16/2012 Time: 0905-0950 SLP Time Calculation (min): 45 min  Past Medical History:  Past Medical History  Diagnosis Date  . Hypertension   . Cancer   . Cancer of prostate    Past Surgical History:  Past Surgical History  Procedure Date  . Hernia repair   . Prostate biopsy   . Robotic prostate surgery 2011  . Laparotomy 02/25/2012    Procedure: EXPLORATORY LAPAROTOMY;  Surgeon: [name], MD;  Location: [facility];  Service: General;  Laterality: N/A;  . Bowel resection 02/25/2012    Procedure: SMALL BOWEL RESECTION;  Surgeon: [name], MD;  Location: [facility];  Service: General;;  . Wound debridement 03/02/2012    Procedure: DEBRIDEMENT CLOSURE/ABDOMINAL WOUND;  Surgeon: Cherylynn Ridges, MD;  Location: [facility] OR;  Service: General;  Laterality: N/A;   HPI:  66 yo male smoker admitted to [facility] 02/24/2012 with n/v and abdominal pain due to SBO due to adhesions.  Had ex lap with SB resection 5/10.  Developed delirium post-op.  Transferred to [facility] 5/14 for respiratory failure requiring intubation.  Developed fascial dehiscence and taken back to OR 5/16.  Failed extubation 5/19, reintubated and required tracheostomy 5/22.  Has been tolerating trach collar with PMSV with full supervision. Had large bore NG pulled today prior to assessment.      Assessment / Plan / Recommendation Clinical Impression  Dysphagia Diagnosis: Severe pharyngeal phase dysphagia Clinical impression: Pt demonstrates a severe pharyngeal dysphagia with motor and sensory deficits with silent and sensed aspiration of all liquid consistencies and puree. Significant weakness observed in laryngeal elevation, brief and reduced epiglottic deflection and pharyngeal contraction. This weakness results in sluggish swallow response, poor propulsion of bolus,  limited airway protection and reduced opening of UES causing aspriation before during and after the swallow of residuals in lateral channels, above CP segment and through interarytenoid space. Residuals mix with thin copious secretions cauring pt to repeatedly aspirate POs post swallow. At this time pt is not safe to consume any POs and would benefit from short term alternate nutrition with pharyngeal strengthening exercises with repeat swallow eval in 5-7 days.     Treatment Recommendation  Therapy as outlined in treatment plan below    Diet Recommendation Alternative means - temporary;NPO        Other  Recommendations Recommended Consults: FEES Oral Care Recommendations: Oral care QID   Follow Up Recommendations  Inpatient Rehab    Frequency and Duration min 2x/week  2 weeks   Pertinent Vitals/Pain NA    SLP Swallow Goals Goal #3: Pt will complete pharyngeal strengthening exercises with 10 reps each with min verbal cues.    General HPI: 66 yo male smoker admitted to [facility] 02/24/2012 with n/v and abdominal pain due to SBO due to adhesions.  Had ex lap with SB resection 5/10.  Developed delirium post-op.  Transferred to [facility] 5/14 for respiratory failure requiring intubation.  Developed fascial dehiscence and taken back to OR 5/16.  Failed extubation 5/19, reintubated and required tracheostomy 5/22.  Has been tolerating trach collar with PMSV with full supervision. Had large bore NG pulled today prior to assessment.  Type of Study: Fiberoptic Endoscopic Evaluation of Swallowing Reason for Referral: Objectively evaluate swallowing function Diet Prior to this Study: NPO (NG tube) Temperature Spikes Noted: No Respiratory Status: Trach Trach Size and Type: Cuff;Deflated;#8;With  PMSV in place History of Recent Intubation: Yes Length of Intubations (days): 9 days Date extubated: 03/08/12 Behavior/Cognition: Alert;Cooperative Oral Cavity - Dentition: Adequate natural dentition Oral Motor /  Sensory Function: Within functional limits Self-Feeding Abilities: Able to feed self Vision: Functional for self-feeding Patient Positioning: Upright in bed Baseline Vocal Quality: Wet Volitional Cough: Strong Volitional Swallow: Able to elicit Anatomy: Within functional limits Pharyngeal Secretions: Standing secretions in (comment) (pharynx)    Reason for Referral Objectively evaluate swallowing function   Oral Phase     Pharyngeal Phase Pharyngeal Phase: Impaired   Cervical Esophageal Phase Cervical Esophageal Phase: Impaired    Marcine Gadway, [name] 03/16/2012, 10:59 AM

## 2012-03-16 NOTE — Progress Notes (Signed)
Physical Therapy Treatment Patient Details Name: Michael Ellison MRN: [id number] DOB: January 27, 1946 Today's Date: 03/16/2012 Time: 4098-1191 PT Time Calculation (min): 34 min  PT Assessment / Plan / Recommendation Comments on Treatment Session  Patient s/p SB resection with fascial dehiscence with VDRF and resultant trach.  Will continue to work on addressing patient's strength and endurance for activity.      Follow Up Recommendations  Skilled nursing facility;Supervision/Assistance - 24 hour    Barriers to Discharge  None      Equipment Recommendations  Defer to next venue    Recommendations for Other Services  None  Frequency Min 3X/week   Plan Discharge plan remains appropriate;Frequency remains appropriate    Precautions / Restrictions Precautions Precautions: Fall Restrictions Weight Bearing Restrictions: No   Pertinent Vitals/Pain VSS/ No pain    Mobility  Bed Mobility Bed Mobility: Rolling Right;Right Sidelying to Sit;Sitting - Scoot to [place] of Bed Rolling Right: 4: Min assist Right Sidelying to Sit: 4: Min assist;HOB flat Supine to Sit: 4: Min assist Sitting - Scoot to Edge of Bed: 5: Supervision Sit to Supine: Not Tested (comment) Details for Bed Mobility Assistance: cues for technique and hand placement Transfers Transfers: Sit to Stand;Stand to Sit Sit to Stand: 4: Min assist;With upper extremity assist;From bed Stand to Sit: 4: Min assist;With upper extremity assist;To chair/3-in-1;With armrests Stand Pivot Transfers: Not tested (comment) Details for Transfer Assistance: min vcs for hand placement, posture during transfers and ambulation, +2 for ambulation due to lines. Ambulation/Gait Ambulation/Gait Assistance: 4: Min guard Ambulation Distance (Feet): 225 Feet Assistive device: Rolling walker Ambulation/Gait Assistance Details: Patient with incr steadiness overall.  Incr distance with several standing rest breaks.   Gait Pattern: Step-through  pattern;Decreased step length - right;Decreased step length - left;Decreased stride length;Shuffle Gait velocity: Slow cadence Stairs: No Wheelchair Mobility Wheelchair Mobility: No    Exercises Total Joint Exercises Ankle Circles/Pumps: AROM;10 reps;Supine Heel Slides: AROM;10 reps;Supine    PT Goals Acute Rehab PT Goals PT Goal: Supine/Side to Sit - Progress: Progressing toward goal PT Goal: Sit at Edge Of Bed - Progress: Progressing toward goal PT Goal: Sit to Stand - Progress: Progressing toward goal PT Goal: Stand to Sit - Progress: Progressing toward goal PT Transfer Goal: Bed to Chair/Chair to Bed - Progress: Progressing toward goal PT Goal: Ambulate - Progress: Progressing toward goal  Visit Information  Last PT Received On: 03/16/12 Assistance Needed: +2 PT/OT Co-Evaluation/Treatment: Yes    Subjective Data  Subjective: "I failed my swallow test."   Cognition  Overall Cognitive Status: Appears within functional limits for tasks assessed/performed Difficult to assess due to: Tracheostomy Arousal/Alertness: Awake/alert Orientation Level: Appears intact for tasks assessed Behavior During Session: [facility] for tasks performed    Balance  Balance Balance Assessed: Yes Static Sitting Balance Static Sitting - Balance Support: No upper extremity supported;Feet supported Static Sitting - Level of Assistance: 6: Modified independent (Device/Increase time) Static Sitting - Comment/# of Minutes: 5 Static Standing Balance Static Standing - Balance Support: Bilateral upper extremity supported;During functional activity Static Standing - Level of Assistance: 4: Min assist  End of Session PT - End of Session Equipment Utilized During Treatment: Gait belt Activity Tolerance: Patient tolerated treatment well Patient left: in chair;with call bell/phone within reach;with family/visitor present Nurse Communication: Mobility status    INGOLD,Stein Windhorst 03/16/2012, 11:02 AM  [facility] [phone] (pager)

## 2012-03-17 ENCOUNTER — Inpatient Hospital Stay (HOSPITAL_COMMUNITY): Payer: Medicare Other

## 2012-03-17 DIAGNOSIS — G92 Toxic encephalopathy: Secondary | ICD-10-CM

## 2012-03-17 DIAGNOSIS — R5381 Other malaise: Secondary | ICD-10-CM

## 2012-03-17 LAB — BASIC METABOLIC PANEL
CO2: 24 mEq/L (ref 19–32)
Chloride: 105 mEq/L (ref 96–112)
Creatinine, Ser: 0.59 mg/dL (ref 0.50–1.35)
Sodium: 135 mEq/L (ref 135–145)

## 2012-03-17 LAB — CBC
MCV: 89.8 fL (ref 78.0–100.0)
Platelets: 301 10*3/uL (ref 150–400)
RBC: 3.61 MIL/uL — ABNORMAL LOW (ref 4.22–5.81)
WBC: 12.4 10*3/uL — ABNORMAL HIGH (ref 4.0–10.5)

## 2012-03-17 LAB — GLUCOSE, CAPILLARY: Glucose-Capillary: 116 mg/dL — ABNORMAL HIGH (ref 70–99)

## 2012-03-17 MED ORDER — ESCITALOPRAM OXALATE 10 MG PO TABS
10.0000 mg | ORAL_TABLET | Freq: Every day | ORAL | Status: DC
  Administered 2012-03-17 – 2012-04-25 (×38): 10 mg via ORAL
  Filled 2012-03-17 (×40): qty 1

## 2012-03-17 MED ORDER — ALBUTEROL SULFATE (5 MG/ML) 0.5% IN NEBU
2.5000 mg | INHALATION_SOLUTION | Freq: Four times a day (QID) | RESPIRATORY_TRACT | Status: DC
  Administered 2012-03-17 – 2012-04-25 (×154): 2.5 mg via RESPIRATORY_TRACT
  Filled 2012-03-17 (×152): qty 0.5

## 2012-03-17 MED ORDER — POTASSIUM CHLORIDE 10 MEQ/50ML IV SOLN
10.0000 meq | INTRAVENOUS | Status: AC
  Administered 2012-03-17 (×3): 10 meq via INTRAVENOUS
  Filled 2012-03-17: qty 150

## 2012-03-17 MED ORDER — ALBUTEROL SULFATE (5 MG/ML) 0.5% IN NEBU
2.5000 mg | INHALATION_SOLUTION | RESPIRATORY_TRACT | Status: DC | PRN
  Administered 2012-03-19 – 2012-04-11 (×9): 2.5 mg via RESPIRATORY_TRACT
  Filled 2012-03-17 (×9): qty 0.5

## 2012-03-17 MED ORDER — TRACE MINERALS CR-CU-MN-SE-ZN 10-1000-500-60 MCG/ML IV SOLN
INTRAVENOUS | Status: DC
  Filled 2012-03-17: qty 2400

## 2012-03-17 MED ORDER — ALBUTEROL SULFATE (5 MG/ML) 0.5% IN NEBU
INHALATION_SOLUTION | RESPIRATORY_TRACT | Status: AC
  Administered 2012-03-17: 2.5 mg via RESPIRATORY_TRACT
  Filled 2012-03-17: qty 0.5

## 2012-03-17 MED ORDER — FAT EMULSION 20 % IV EMUL
250.0000 mL | INTRAVENOUS | Status: DC
  Filled 2012-03-17: qty 250

## 2012-03-17 MED ORDER — IOHEXOL 300 MG/ML  SOLN
20.0000 mL | Freq: Once | INTRAMUSCULAR | Status: AC | PRN
  Administered 2012-03-17: 20 mL

## 2012-03-17 NOTE — Progress Notes (Signed)
Speech Language Pathology Dysphagia Treatment Patient Details Name: Michael Ellison MRN: [id number] DOB: 03/22/1946 Today's Date: 03/17/2012 Time: 4098-1191 SLP Time Calculation (min): 29 min  Assessment / Plan / Recommendation Clinical Impression  Pharyngeal strengthening exercises completed with PMSV in place. Pt completed with difficulty due to severity of pharyngeal weakness. SLP provided pt with written instruction to be completed independently over the weekend. SLP will f/u on Monday.     Diet Recommendation  Continue with Current Diet: NPO    SLP Plan Continue with current plan of care   Pertinent Vitals/Pain NA   Swallowing Goals  SLP Swallowing Goals Goal #3: Pt will complete pharyngeal strengthening exercises with 10 reps each with min verbal cues.  Swallow Study Goal #3 - Progress: Progressing toward goal  General Temperature Spikes Noted: No Respiratory Status: Trach Behavior/Cognition: Alert;Cooperative Oral Cavity - Dentition: Adequate natural dentition Patient Positioning: Upright in bed  Oral Cavity - Oral Hygiene Patient is HIGH RISK - Oral Care Protocol followed (see row info): Yes   Dysphagia Treatment Treatment focused on: Facilitation of pharyngeal phase Treatment Methods/Modalities: Effortful swallow;Masako Maneuver;Mendelsohn Maneuver Patient observed directly with PO's: Yes Type of PO's observed: Ice chips Feeding: Total assist Liquids provided via: Teaspoon Pharyngeal Phase Signs & Symptoms: Multiple swallows;Immediate throat clear Type of cueing: Verbal;Tactile;Visual Amount of cueing: Moderate   Shereese , [name] 03/17/2012, 3:54 PM

## 2012-03-17 NOTE — Progress Notes (Signed)
Passy-Muir Speaking Valve - Treatment Patient Details  Name: Michael Ellison MRN: [id number] Date of Birth: [birth year]  Today's Date: 03/17/2012 Time: 4098-1191 SLP Time Calculation (min): 29 min  Past Medical History:  Past Medical History  Diagnosis Date  . Hypertension   . Cancer   . Cancer of prostate    Past Surgical History:  Past Surgical History  Procedure Date  . Hernia repair   . Prostate biopsy   . Robotic prostate surgery 2011  . Laparotomy 02/25/2012    Procedure: EXPLORATORY LAPAROTOMY;  Surgeon: [name], MD;  Location: [facility];  Service: General;  Laterality: N/A;  . Bowel resection 02/25/2012    Procedure: SMALL BOWEL RESECTION;  Surgeon: [name], MD;  Location: [facility];  Service: General;;  . Wound debridement 03/02/2012    Procedure: DEBRIDEMENT CLOSURE/ABDOMINAL WOUND;  Surgeon: Cherylynn Ridges, MD;  Location: [facility] OR;  Service: General;  Laterality: N/A;    Assessment / Plan / Recommendation Clinical Impression  Pt trach changed to a 6 cuffed, still with periods of tachypnea without valve in place. SLP observed pt intermittently throughout the day while wearning the valve with no instances of SOB exclusively with vavle in place. In fact pt reached normal respiratory rates while wearning valve and achieved adequate breath support and functional oral expectoration of secretions. Despite pts intermittently high RR, recommend pt wear PMSV with intermittent supervision during all waking hours for imporved mobilization of secretions and more normal respiraiton. RN informed    Plan  Continue with current plan of care    Follow Up Recommendations  Inpatient Rehab    Pertinent Vitals/Pain NA    SLP Goals Potential to Achieve Goals: Good Progress/Goals/Alternative treatment plan discussed with pt/caregiver and they: Agree SLP Goal #1: Pt will tolerate PMSV during all waking hours with intermittent supervision without distress.  SLP Goal #1 - Progress:  Progressing toward goal SLP Goal #2: Pt will demonstrate independence with valve placement and removal with min verbal and visual cues.  SLP Goal #2 - Progress: Progressing toward goal   PMSV Trial  PMSV was placed for: First 30 min in am, then 30 minutes in pm  Able to redirect subglottic air through upper airway: Yes Able to Attain Phonation: Yes Voice Quality: Wet Able to Expectorate Secretions: Yes Level of Secretion Expectoration with PMSV: Oral Breath Support for Phonation: Adequate Intelligibility: Intelligible Respirations During Trial:  (15 to 35. In pm, rate decreased to 20s. 40 without valve) SpO2 During Trial: 96 % Behavior: Alert;Anxious   Tracheostomy Tube  Additional Tracheostomy Tube Assessment Trach Collar Period: 24 hours Secretion Description: thin clear copious Level of Secretion Expectoration: Tracheal    Vent Dependency  Vent Dependent: No FiO2 (%): 28 % Nocturnal Vent: No    Cuff Deflation Trial Tolerated Cuff Deflation: Yes Length of Time for Cuff Deflation Trial:  (deflated at baseline) Behavior: Alert;Anxious;Cooperative   Jeremiah Curci, [name] 03/17/2012, 4:02 PM

## 2012-03-17 NOTE — Progress Notes (Signed)
[facility] Progress Note 15 Days Post-Op  Subjective: Patient is sitting up in chair, has feeding tube in place.  Alert and awake.  FEES study has been recommended.  Objective: Vital signs in last 24 hours: Temp:  [98 F (36.7 C)-98.9 F (37.2 C)] 98.6 F (37 C) (05/31 1159) Pulse Rate:  [86-102] 102  (05/31 1230) Resp:  [21-37] 21  (05/31 1230) BP: (104-120)/(72-79) 119/79 mmHg (05/31 1159) SpO2:  [95 %-100 %] 100 % (05/31 1230) FiO2 (%):  [28 %] 28 % (05/31 1230) Weight:  [74.3 kg (163 lb 12.8 oz)] 74.3 kg (163 lb 12.8 oz) (05/30 2356) Last BM Date: 03/16/12  Intake/Output from previous day: 05/30 0701 - 05/31 0700 In: 3010 [I.V.:490; TPN:2520] Out: 1150 [Urine:1150] Intake/Output this shift:    General: No acute distress  Lungs: Clear.  Has tracheostomy in place functioning well  Abd: Soft, good bowel sounds, VAC in place but not adequately covering all the subcutaneous tissue.  Extremities: No changes  Neuro: Intact  Lab Results:  @LABLAST2 (wbc:2,hgb:2,hct:2,plt:2) BMET  Basename 03/17/12 0518 03/16/12 0500  NA 135 135  K 3.8 4.0  CL 105 102  CO2 24 24  GLUCOSE 105* 138*  BUN 18 21  CREATININE 0.59 0.63  CALCIUM 8.4 8.3*   PT/INR No results found for this basename: LABPROT:2,INR:2 in the last 72 hours ABG No results found for this basename: PHART:2,PCO2:2,PO2:2,HCO3:2 in the last 72 hours  Studies/Results: Dg Abd 1 View  03/17/2012  *RADIOLOGY REPORT*  Clinical Data: Panda placement  ABDOMEN - 1 VIEW  Comparison: 02/29/2012  Findings: Panda tube tip is in the duodenum at the ligament of Treitz.  Contrast was injected filling the duodenum.  Duodenum is not dilated.  IMPRESSION: Panda tip at the ligament of Treitz.  Original Report Authenticated By: Camelia Phenes, M.D.   Dg Chest Port 1 View  03/17/2012  *RADIOLOGY REPORT*  Clinical Data: Respiratory failure.  PORTABLE CHEST - 1 VIEW  Comparison: Chest x-ray 03/13/2012.  Findings: A tracheostomy tube is  in place with tip 6.9 cm above the carina. There is a left-sided subclavian central venous catheter with tip terminating in the mid superior vena cava. Previously noted nasogastric tube has been removed.  The lung volumes are slightly low.  There are persistent bibasilar linear opacities which may reflect areas of atelectasis and/or scarring.  Bilateral apical bullous changes are again noted.  No definite pleural effusions.  No evidence of pulmonary edema.  Heart size is borderline enlarged. The patient is rotated to the left on today's exam, resulting in distortion of the mediastinal contours and reduced diagnostic sensitivity and specificity for mediastinal pathology.  Atherosclerotic calcifications within the arch of the aorta.  IMPRESSION: 1.  Support apparatus, as above. 2.  Persistent bibasilar opacities favored to represent areas of atelectasis and/or scarring. 3.  Atherosclerosis. 4.  Bullous emphysematous changes in the lung apices again noted.  Original Report Authenticated By: Florencia Reasons, M.D.    Anti-infectives: Anti-infectives     Start     Dose/Rate Route Frequency Ordered Stop   03/03/12 1800   vancomycin (VANCOCIN) 1,750 mg in sodium chloride 0.9 % 500 mL IVPB  Status:  Discontinued        1,750 mg 250 mL/hr over 120 Minutes Intravenous Every 12 hours 03/03/12 0628 03/03/12 0918   02/29/12 1900   micafungin (MYCAMINE) 100 mg in sodium chloride 0.9 % 100 mL IVPB  Status:  Discontinued        100 mg  100 mL/hr over 1 Hours Intravenous Every 24 hours 02/29/12 1754 03/01/12 0933   02/29/12 1730   vancomycin (VANCOCIN) 1,250 mg in sodium chloride 0.9 % 250 mL IVPB  Status:  Discontinued        1,250 mg 166.7 mL/hr over 90 Minutes Intravenous Every 12 hours 02/29/12 1644 03/03/12 0628   02/29/12 1715   vancomycin (VANCOCIN) IVPB 1000 mg/200 mL premix  Status:  Discontinued        1,000 mg 200 mL/hr over 60 Minutes Intravenous  Once 02/29/12 1637 02/29/12 1647   02/29/12 1645    piperacillin-tazobactam (ZOSYN) IVPB 3.375 g  Status:  Discontinued        3.375 g 100 mL/hr over 30 Minutes Intravenous  Once 02/29/12 1637 02/29/12 1641   02/28/12 1800   piperacillin-tazobactam (ZOSYN) IVPB 3.375 g  Status:  Discontinued        3.375 g 12.5 mL/hr over 240 Minutes Intravenous 4 times per day 02/28/12 1454 02/28/12 1510   02/28/12 1800   piperacillin-tazobactam (ZOSYN) IVPB 3.375 g  Status:  Discontinued        3.375 g 12.5 mL/hr over 240 Minutes Intravenous Every 8 hours 02/28/12 1510 03/14/12 1700   02/25/12 1109   dextrose 5 % with cefOXitin (MEFOXIN) AD[facility] Med     Comments: HOLLIE, CHRISTINA: cabinet override         02/25/12 1109 02/25/12 2314   02/25/12 1100   cefOXitin (MEFOXIN) 1 g in dextrose 5 % 50 mL IVPB  Status:  Discontinued        1 g 100 mL/hr over 30 Minutes Intravenous 60 min pre-op 02/25/12 1100 02/25/12 1542          Assessment/Plan: s/p Procedure(s): DEBRIDEMENT CLOSURE/ABDOMINAL WOUND Continue tube feedings until patient passes swallowing study. Wound is okay. Should be able to go to Rehab soon. Can go to Wet-Dry dressings   LOS: 22 days   Marta Lamas. [name], MD, FACS [phone] [facility] 03/17/2012

## 2012-03-17 NOTE — Progress Notes (Signed)
Name: Michael Ellison MRN: [id number] DOB: [birth year]    LOS: 22  PULMONARY / CRITICAL CARE MEDICINE  Pt Profile:   66 y/o male smoker admitted to [facility] 02/24/2012 with n/v and abdominal pain due to SBO due to adhesions.  Had ex lap with SB resection 5/10.  Developed delirium post-op.  Transferred to [facility] 5/14 for respiratory failure requiring intubation.  Developed abdominal fascial dehiscence and taken back to OR 5/16.  Failed extubation, and required tracheostomy 5/22. PMHx ETOH, HTN, Prostate cancer   Interval hx:  5/16: OR for fascial dehiscence repair- closed with wound vac 5/18- failed weaning 5/19- extubated 5/21- re-intubated for hypercarbia 5/22-improved co2 and neurostatus, pos almost 2 liters 5/22 SVT>>resolved spontaneously 5/30 -off vent for 24 hours, planned Panda placement   Subjective: Off vent since 5/29, oob to chair, depressed affect  Vital Signs: Temp:  [98 F (36.7 C)-98.9 F (37.2 C)] 98.5 F (36.9 C) (05/31 0916) Pulse Rate:  [86-100] 97  (05/31 0927) Resp:  [24-37] 36  (05/31 0927) BP: (104-120)/(72-79) 115/72 mmHg (05/31 0916) SpO2:  [97 %-100 %] 100 % (05/31 0927) FiO2 (%):  [28 %] 28 % (05/31 0927) Weight:  [163 lb 12.8 oz (74.3 kg)] 163 lb 12.8 oz (74.3 kg) (05/30 2356)  Physical Examination: General - pleasant male, NAD in bed   HEENT - trach site clean Cardiac - s1s2 regular, no murmur Chest - few rhonchi, no wheeze.  Abd - midline open wound with wound vac in place, tender with mild palpation, +bs Ext - no edema Neuro - follows commands, alert    CBC  Lab 03/17/12 0518 03/16/12 0500 03/14/12 0435  HGB 10.4* 10.1* 9.8*  HCT 32.4* 31.2* 30.9*  WBC 12.4* 12.4* 12.1*  PLT 301 338 402*    BMET  Lab 03/17/12 0518 03/16/12 0500 03/15/12 0500 03/14/12 0435 03/13/12 0500  NA 135 135 136 138 141  K 3.8 4.0 -- -- --  CL 105 102 105 106 107  CO2 24 24 24 26 25   GLUCOSE 105* 138* 100* 110* 100*  BUN 18 21 23  26* 29*  CREATININE 0.59 0.63  0.69 0.76 0.82  CALCIUM 8.4 8.3* 8.4 8.4 8.4  MG -- 1.8 -- 2.0 2.1  PHOS -- 3.0 -- 2.8 2.9     Lab Results  Component Value Date   ALT 35 03/16/2012   AST 29 03/16/2012   ALKPHOS 104 03/16/2012   BILITOT 0.6 03/16/2012    Intake/Output Summary (Last 24 hours) at 03/17/12 1120 Last data filed at 03/17/12 0700  Gross per 24 hour  Intake   3010 ml  Output   1150 ml  Net   1860 ml   Dg Abd 1 View  03/17/2012  *RADIOLOGY REPORT*  Clinical Data: Panda placement  ABDOMEN - 1 VIEW  Comparison: 02/29/2012  Findings: Panda tube tip is in the duodenum at the ligament of Treitz.  Contrast was injected filling the duodenum.  Duodenum is not dilated.  IMPRESSION: Panda tip at the ligament of Treitz.  Original Report Authenticated By: Camelia Phenes, M.D.   Dg Chest Port 1 View  03/17/2012  *RADIOLOGY REPORT*  Clinical Data: Respiratory failure.  PORTABLE CHEST - 1 VIEW  Comparison: Chest x-ray 03/13/2012.  Findings: A tracheostomy tube is in place with tip 6.9 cm above the carina. There is a left-sided subclavian central venous catheter with tip terminating in the mid superior vena cava. Previously noted nasogastric tube has been removed.  The lung volumes are  slightly low.  There are persistent bibasilar linear opacities which may reflect areas of atelectasis and/or scarring.  Bilateral apical bullous changes are again noted.  No definite pleural effusions.  No evidence of pulmonary edema.  Heart size is borderline enlarged. The patient is rotated to the left on today's exam, resulting in distortion of the mediastinal contours and reduced diagnostic sensitivity and specificity for mediastinal pathology.  Atherosclerotic calcifications within the arch of the aorta.  IMPRESSION: 1.  Support apparatus, as above. 2.  Persistent bibasilar opacities favored to represent areas of atelectasis and/or scarring. 3.  Atherosclerosis. 4.  Bullous emphysematous changes in the lung apices again noted.  Original Report  Authenticated By: Florencia Reasons, M.D.    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 5/30 CXR>> Small pleural effusions/ atx  Acute hypoxic/hypercapnic respiratory failure 2nd to delirium, aspiration pneumonia, 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  Failed extubation, and s/p trach 5/22.   Plan: -Continue ATC as tolerated --day & night -vent pulled from room 5/30 -continue schedule BD's -downsize to #6 cuffed 5/30   CARDIOVASCULAR  left [place] CVL 5/14>>> 5/31 Rt femoral aline 5/14>>>out  Septic/hypovolemic shock 2nd to SBO complicated by wound dehisence>>resolved.   HTN, Edema, Intermittent tachycardia Plan: -continue lasix 40 to keep in negative fluid balance -re-added metoprolol 5/23>>monitor for bradycardia -prn hydralazine   RENAL  Hypokalemia, hypernatremia resolved.   GASTROINTESTINAL  5/09 CT abd/pelvis>>SBO, RLQ transition point, small Rt inguinal hernia, diverticulosis 5/14 CT abd/pelvis>>ileus ABD Wound Vac 5/16>>  SBO s/p lap with SB resection 5/10 complicated by post-op ileus and fascial dehiscence, and return to OR 5/16. Plan: -post-op care per [facility]- D/w Dr. Donell Beers - ok for swallow eval and po intake  -cont TF x 24h  -for FEES swallow eval per ST 5/30 -- place panda since he failed -dc TNA once tolerates PO/TF, dc CVL   HEMATOLOGIC  Anemia of critical illness.  Plan: -transfuse for Hb < 7 -f/u cbc    INFECTIOUS  Cultures: BCX2 5/14 >>1/2 gram neg rod>>> 1/2 Klebsiella  UC 5/14 >>Enterococcus (sensitive to ampicillin) Sputum 5/14>> Klebsiella pneumoniae (pan sensitive) Wound 5/14>>Klebsiella Pneumoniae (pan sensitive) & Enterobacter Cloacae (resistant to ancef, cefoxitin) BC X2 5/20>>>ng Sputum 5/22>>>klebs,  enterobacter  Antibiotics: vanc 5/14 >> 5/17 Zosyn 5/13 >>5/28  Wound infection, aspiration pneumonia, UTI. Plan: -monitor off abx    ENDOCRINE CBG (last 3)   Basename 03/17/12 0650 03/17/12 0638 03/16/12 2357  GLUCAP 111* 116* 99   Hyperglycemia. Plan: -SSI  NEUROLOGIC  Acute metabolic encephalopathy 2nd to sepsis, respiratory failure, post-op pain, and ETOH.  Much improved.  Intolerant of lorazepam (does okay with midazolam). Plan: -PRN fentanyl for pain  -limit benzo's and haldol -pt/ot when ok with [facility]CS    Depression / Anxiety Plan: -begin lexapro  5/31, hx of depression -was not on meds PTA (had stopped taking), ETOH abuse Daughter who is a  Child psychotherapist concerned about his depression   DISPO - has no LTAC benefits, plan for SNF/ rehab vs CIR    Will hold labs for am 6/1, f/u cbc, bmp and CXR am of 6/2   [name], NP-C  Pulmonary & Critical Care Pgr: [phone] or [phone]  Independently examined pt, evaluated data & formulated above care plan with NP  [facility] V.

## 2012-03-17 NOTE — Progress Notes (Signed)
PARENTERAL NUTRITION CONSULT NOTE - FOLLOW UP   Pharmacy Consult for TPN Indication: Prolonged ileus s/p SBR for SBO 5/10  Allergies  Allergen Reactions  . Lorazepam Other (See Comments)    Severe agitated delirium. Tolerates midazolam and other benzo's    Patient Measurements: Height: 6\' 2"  (188 cm) Weight: 163 lb 12.8 oz (74.3 kg) IBW/kg (Calculated) : 82.2  Usual Weight: ~82 kg  Vital Signs: Temp: 98 F (36.7 C) (05/31 0352) Temp src: Oral (05/31 0352) BP: 115/77 mmHg (05/31 0352) Pulse Rate: 97  (05/31 0927) Intake/Output from previous day: 05/30 0701 - 05/31 0700 In: 3010 [I.V.:490; TPN:2520] Out: 1150 [Urine:1150] Intake/Output from this shift:    Labs:  Basename 03/17/12 0518 03/16/12 0500  WBC 12.4* 12.4*  HGB 10.4* 10.1*  HCT 32.4* 31.2*  PLT 301 338  APTT -- --  INR -- --     Basename 03/17/12 0518 03/16/12 0500 03/15/12 0500  NA 135 135 136  K 3.8 4.0 4.0  CL 105 102 105  CO2 24 24 24   GLUCOSE 105* 138* 100*  BUN 18 21 23   CREATININE 0.59 0.63 0.69  LABCREA -- -- --  CREAT24HRUR -- -- --  CALCIUM 8.4 8.3* 8.4  MG -- 1.8 --  PHOS -- 3.0 --  PROT -- 6.9 --  ALBUMIN -- 1.8* --  AST -- 29 --  ALT -- 35 --  ALKPHOS -- 104 --  BILITOT -- 0.6 --  BILIDIR -- -- --  IBILI -- -- --  PREALBUMIN -- -- --  TRIG -- -- --  CHOLHDL -- -- --  CHOL -- -- --   Estimated Creatinine Clearance: 96.7 ml/min (by C-G formula based on Cr of 0.59).    Basename 03/17/12 0650 03/17/12 0638 03/16/12 2357  GLUCAP 111* 116* 99    Insulin Requirements in the past 24 hours:  0 units SSI  Nutritional Goals:  1900-2000 kCal, 105-120 grams of protein per day, 2-2.3L  Current Nutrition:  Clinimix E 5/20 at 100 ml/hr + lipids 20ml/hr M/W/F providing 1910 kcal; 120 gm protein providing 100% of kcal and 100% protein needs  Began tube feeds 5/28 to advance to goal of 1928 calories, 110gm protein  Assessment: 66 y.o. male on TPN for prolonged ileus s/p SBR for  SBO on 5/10 at [facility]. Noted PMH of alcohol abuse. TPN initiated 03/01/12. Tube feeds initiated 5/28 and to wean off TPN with advancement of enteral nutrition. Pt with significant dysphagia on swallow evaluation (5/30) and panda tube placed for tube feeds to resume. Prealbumin now up to 12.5 (goal >17). Likely low d/t stress of illness, adjusted formula to provide more protein. Noted 2 BM 5/30.  GI: SBO s/p resection 5/10 at [facility], ileus on bowel rest. S/p OR 5/16 for debridement/abd closure. Patient with no NG output.Marland Kitchen VAC changes MWF. Tube feeds via NGT begun 5/28 and to restart after panda placement. Pt was tolerating 31ml/hr with goal advancement to 50ml/hr.  Endo: No h/o DM. CBGs well controlled requiring little insulin and no insulin in bag. Lytes: K 3.8 (goal >4 in ileus). All WNL, including Corr Ca= 10.16 Renal: SCr WNL/stable, UOP 0.6 ml/kg/hr Pulm: Trach placed 5/22, trach collar as tolerates and vent PRN. Cards:  hx HTN. VSS on IV metoprolol q8h. Furosemide added 5/28.  Hepatobil: AST/ALT ok, tbili improved from prev (2.6 ->1.4). Trig WNL, prealbumin up to 12.5.  Neuro: acute encephalopathy (ativan allergy); fentanyl prn  ID: Completed 16 day course of Zosyn for  Kleb bacteremia (repeat BC=NG).  Afebrile, WBC 12.4. Best Practices:  Lovenox, PPI IV, MC  Plan:  - Continue Clinimix E 5/15 at 133ml/hr, will not adjust at this time pending tolerance of tube feeds; would have low threshold for d/c TPN if patient continues to tolerate enteral feeds.   - d/c CBG/SSI - Multivitamins, trace elements, and lipids on M/W/F due to national shortage. - Thiamine and folic acid daily in TPN -KCl IV x 3 runs  - Continue to trend prealbumin - No tpn labs tomorrow  Thank you,  Brett Fairy, PharmD Pager: [phone]  03/17/2012 10:02 AM

## 2012-03-17 NOTE — Progress Notes (Signed)
OK to use Panda tube per radiology

## 2012-03-17 NOTE — Consult Note (Signed)
Physical Medicine and Rehabilitation Consult Reason for Consult: Deconditioning Referring Phyiscian:  Dr. Vassie Loll   HPI: Michael Ellison is a 66 y.o. male smoker with history of HTN, prostate CA, alcohol use,  admitted to [facility] 02/24/2012 with n/v and abdominal pain due to SBO due to adhesions. Had ex lap with SB resection 5/10. Developed delirium post-op. Transferred to [facility] 5/14 for respiratory failure requiring intubation. Developed fascial dehiscence and taken back to OR 5/16. Failed extubation, and required tracheostomy 5/22 and tolerating ATC now.  Patient has been treated for multiple infections: enterococcus in  UTI, as well as Klebsiella in BC,sputum culture and wound culture. NPO  With TF for nutritional support.   Acute encephalopathy due to medications improving.  Patient noted to be deconditioned and MD, ST recommending CIR.   Review of Systems  Respiratory: Positive for cough. Negative for shortness of breath.   Cardiovascular: Negative for chest pain and palpitations.  Gastrointestinal: Negative for nausea and vomiting.  Neurological: Positive for dizziness (with activity) and headaches.   Past Medical History  Diagnosis Date  . Hypertension   . Cancer   . Cancer of prostate    Past Surgical History  Procedure Date  . Hernia repair   . Prostate biopsy   . Robotic prostate surgery 2011  . Laparotomy 02/25/2012    Procedure: EXPLORATORY LAPAROTOMY;  Surgeon: [name], MD;  Location: [facility] ORS;  Service: General;  Laterality: N/A;  . Bowel resection 02/25/2012    Procedure: SMALL BOWEL RESECTION;  Surgeon: [name], MD;  Location: [facility] ORS;  Service: General;;  . Wound debridement 03/02/2012    Procedure: DEBRIDEMENT CLOSURE/ABDOMINAL WOUND;  Surgeon: Cherylynn Ridges, MD;  Location: [facility] OR;  Service: General;  Laterality: N/A;   Family History  Problem Relation Age of Onset  . Pseudochol deficiency Neg Hx   . Anesthesia problems Neg Hx   . Hypotension Neg Hx   . Malignant  hyperthermia Neg Hx    Social History: Divorced. Lives alone.   reports that he has been smoking 1 to 11/2 PPD.  He does not have any smokeless tobacco history on file. He reports that he drinks alcohol 6-8 mixed drinks on weekends. He reports that he does not use illicit drugs.  Children work.   Allergies  Allergen Reactions  . Lorazepam Other (See Comments)    Severe agitated delirium. Tolerates midazolam and other benzo's   Medications Prior to Admission  Medication Sig Dispense Refill  . amLODipine (NORVASC) 5 MG tablet Take 5 mg by mouth daily.      Marland Kitchen imipramine (TOFRANIL) 25 MG tablet Take 25 mg by mouth at bedtime.      Marland Kitchen lisinopril (PRINIVIL,ZESTRIL) 20 MG tablet Take 20 mg by mouth daily.        Home: Home Living Lives With: Alone Type of Home: House Home Access: Level entry Home Layout: One level Bathroom Shower/Tub: Engineer, manufacturing systems: Standard Home Adaptive Equipment: Straight cane Additional Comments: Patient shook head that he lives alone.  Unsure of home status secondary to orally intubated.  Will continue questions when patient extubated.    Functional History: Prior Function Driving: No Vocation: Retired Functional Status:  Mobility: Bed Mobility Bed Mobility: Rolling Right;Right Sidelying to Sit;Sitting - Scoot to [place] of Bed Rolling Right: 4: Min assist Right Sidelying to Sit: 4: Min assist;HOB flat Supine to Sit: 4: Min assist Sitting - Scoot to Edge of Bed: 5: Supervision Sit to Supine: Not Tested (comment)  Transfers Transfers: Sit to Stand;Stand to Sit Sit to Stand: 4: Min assist;With upper extremity assist;From bed Sit to Stand: Patient Percentage: 70% Stand to Sit: 4: Min assist;With upper extremity assist;To chair/3-in-1;With armrests Stand to Sit: Patient Percentage: 70% Stand Pivot Transfers: Not tested (comment) Stand Pivot Transfers: Patient Percentage: 70% Ambulation/Gait Ambulation/Gait Assistance: 4: Min guard Ambulation  Distance (Feet): 225 Feet Assistive device: Rolling walker Ambulation/Gait Assistance Details: Patient with incr steadiness overall.  Incr distance with several standing rest breaks.   Gait Pattern: Step-through pattern;Decreased step length - right;Decreased step length - left;Decreased stride length;Shuffle Gait velocity: Slow cadence Stairs: No Wheelchair Mobility Wheelchair Mobility: No  ADL: ADL Grooming: Performed;Wash/dry face Where Assessed - Grooming: Supine, head of bed up Upper Body Bathing: Simulated;Minimal assistance Where Assessed - Upper Body Bathing: Unsupported sitting Lower Body Bathing: Simulated;+1 Total assistance Where Assessed - Lower Body Bathing: Supported sit to stand Upper Body Dressing: Simulated;Minimal assistance Where Assessed - Upper Body Dressing: Unsupported sitting Lower Body Dressing: Simulated;+1 Total assistance Where Assessed - Lower Body Dressing: Sopported sit to stand Toilet Transfer: Simulated;Min guard (+2 for lines) Toilet Transfer Method: Sit to stand Transfers/Ambulation Related to ADLs: Pt ambulated to the door and back to the bed X2. Ready for higher level endurance activities at sink level. ADL Comments: Pt did very well today with major improvements in activity/standing tolerance. Ambulated the length of the hallway & back.  Cognition: Cognition Arousal/Alertness: Awake/alert Orientation Level: Oriented X4 Cognition Overall Cognitive Status: Appears within functional limits for tasks assessed/performed Difficult to assess due to: Tracheostomy Arousal/Alertness: Awake/alert Orientation Level: Appears intact for tasks assessed Behavior During Session: [facility] for tasks performed  Blood pressure 115/77, pulse 97, temperature 98 F (36.7 C), temperature source Oral, resp. rate 36, height 6\' 2"  (1.88 m), weight 74.3 kg (163 lb 12.8 oz), SpO2 100.00%. Physical Exam  Nursing note and vitals reviewed. Constitutional: He is oriented to  person, place, and time. He appears well-developed and well-nourished.  HENT:  Head: Normocephalic and atraumatic.  Eyes: Pupils are equal, round, and reactive to light.  Neck:       Cuffed trach in place.    Cardiovascular: Normal rate and regular rhythm.   Pulmonary/Chest: Effort normal.       Coarse breath sounds.  Wet, congested cough noted.   Abdominal: Bowel sounds are normal. He exhibits no distension. There is tenderness.       Retention sutures and VAC noted.  Musculoskeletal: He exhibits no edema and no tenderness.  Neurological: He is alert and oriented to person, place, and time.  Skin: Skin is warm and dry.  Psychiatric: He has a normal mood and affect. His behavior is normal. Judgment and thought content normal.  motor strength is 4/5 in the deltoid, biceps, triceps, grip, hip flexor, knee extension, ankle dorsiflexor Sensation is normal   Results for orders placed during the hospital encounter of 02/24/12 (from the past 24 hour(s))  GLUCOSE, CAPILLARY     Status: Abnormal   Collection Time   03/16/12 12:19 PM      Component Value Range   Glucose-Capillary 102 (*) 70 - 99 (mg/dL)   Comment 1 Notify RN    GLUCOSE, CAPILLARY     Status: Abnormal   Collection Time   03/16/12  4:45 PM      Component Value Range   Glucose-Capillary 118 (*) 70 - 99 (mg/dL)  GLUCOSE, CAPILLARY     Status: Normal   Collection Time   03/16/12 11:57 PM  Component Value Range   Glucose-Capillary 99  70 - 99 (mg/dL)   Comment 1 Documented in Chart     Comment 2 Notify RN    CBC     Status: Abnormal   Collection Time   03/17/12  5:18 AM      Component Value Range   WBC 12.4 (*) 4.0 - 10.5 (K/uL)   RBC 3.61 (*) 4.22 - 5.81 (MIL/uL)   Hemoglobin 10.4 (*) 13.0 - 17.0 (g/dL)   HCT 08.6 (*) 57.8 - 52.0 (%)   MCV 89.8  78.0 - 100.0 (fL)   MCH 28.8  26.0 - 34.0 (pg)   MCHC 32.1  30.0 - 36.0 (g/dL)   RDW 46.9  62.9 - 52.8 (%)   Platelets 301  150 - 400 (K/uL)  BASIC METABOLIC PANEL      Status: Abnormal   Collection Time   03/17/12  5:18 AM      Component Value Range   Sodium 135  135 - 145 (mEq/L)   Potassium 3.8  3.5 - 5.1 (mEq/L)   Chloride 105  96 - 112 (mEq/L)   CO2 24  19 - 32 (mEq/L)   Glucose, Bld 105 (*) 70 - 99 (mg/dL)   BUN 18  6 - 23 (mg/dL)   Creatinine, Ser 4.13  0.50 - 1.35 (mg/dL)   Calcium 8.4  8.4 - 24.4 (mg/dL)   GFR calc non Af Amer >90  >90 (mL/min)   GFR calc Af Amer >90  >90 (mL/min)  GLUCOSE, CAPILLARY     Status: Abnormal   Collection Time   03/17/12  6:38 AM      Component Value Range   Glucose-Capillary 116 (*) 70 - 99 (mg/dL)  GLUCOSE, CAPILLARY     Status: Abnormal   Collection Time   03/17/12  6:50 AM      Component Value Range   Glucose-Capillary 111 (*) 70 - 99 (mg/dL)   Comment 1 Documented in Chart     Comment 2 Notify RN     Dg Abd 1 View  03/17/2012  *RADIOLOGY REPORT*  Clinical Data: Panda placement  ABDOMEN - 1 VIEW  Comparison: 02/29/2012  Findings: Panda tube tip is in the duodenum at the ligament of Treitz.  Contrast was injected filling the duodenum.  Duodenum is not dilated.  IMPRESSION: Panda tip at the ligament of Treitz.  Original Report Authenticated By: Camelia Phenes, M.D.   Dg Chest Port 1 View  03/17/2012  *RADIOLOGY REPORT*  Clinical Data: Respiratory failure.  PORTABLE CHEST - 1 VIEW  Comparison: Chest x-ray 03/13/2012.  Findings: A tracheostomy tube is in place with tip 6.9 cm above the carina. There is a left-sided subclavian central venous catheter with tip terminating in the mid superior vena cava. Previously noted nasogastric tube has been removed.  The lung volumes are slightly low.  There are persistent bibasilar linear opacities which may reflect areas of atelectasis and/or scarring.  Bilateral apical bullous changes are again noted.  No definite pleural effusions.  No evidence of pulmonary edema.  Heart size is borderline enlarged. The patient is rotated to the left on today's exam, resulting in distortion of  the mediastinal contours and reduced diagnostic sensitivity and specificity for mediastinal pathology.  Atherosclerotic calcifications within the arch of the aorta.  IMPRESSION: 1.  Support apparatus, as above. 2.  Persistent bibasilar opacities favored to represent areas of atelectasis and/or scarring. 3.  Atherosclerosis. 4.  Bullous emphysematous changes in the lung apices  again noted.  Original Report Authenticated By: Florencia Reasons, M.D.    Assessment/Plan: Diagnosis: deconditioning with dysphagia 1. Does the need for close, 24 hr/day medical supervision in concert with the patient's rehab needs make it unreasonable for this patient to be served in a less intensive setting? No 2. Co-Morbidities requiring supervision/potential complications: severe dysphagia, hypertension, postoperative wound infection 3. Due to bowel management, safety, skin/wound care, disease management, medication administration and pain management, does the patient require 24 hr/day rehab nursing? Potentially 4. Does the patient require coordinated care of a physician, rehab nurse, PT (0.5-1 hrs/day, 3 days/week), OT (0.5-1 hrs/day, 3 days/week) and SLP (1 hrs/day, 5 days/week) to address physical and functional deficits in the context of the above medical diagnosis(es)? Potentially Addressing deficits in the following areas: balance, endurance, transferring, bathing, feeding and swallowing 5. Can the patient actively participate in an intensive therapy program of at least 3 hrs of therapy per day at least 5 days per week? Potentially 6. The potential for patient to make measurable gains while on inpatient rehab is good 7. Anticipated functional outcomes upon discharge from inpatient rehab are modified independent ability with PT, modified independent ADLs with OT, maintain by mouth intake adequate for fluid and caloric nutrition with SLP. 8. Estimated rehab length of stay to reach the above functional goals is: 4  weeks 9. Does the patient have adequate social supports to accommodate these discharge functional goals? Potentially 10. Anticipated D/C setting: Home 11. Anticipated post D/C treatments: HH therapy 12. Overall Rehab/Functional Prognosis: good  RECOMMENDATIONS: This patient's condition is appropriate for continued rehabilitative care in the following setting: LTACH Patient has agreed to participate in recommended program. Potentially Note that insurance prior authorization may be required for reimbursement for recommended care.  Comment:the patient mainly has speech therapy needs. He is already walking 225 feet with minimal guard assistance with physical therapy. Therefore he does not require intensive physical therapy or occupational therapy. CIR level rehabilitation requires intensive needs for two or more therapies    03/17/2012

## 2012-03-18 HISTORY — PX: TRACHEOSTOMY: SUR1362

## 2012-03-18 LAB — GLUCOSE, CAPILLARY: Glucose-Capillary: 91 mg/dL (ref 70–99)

## 2012-03-18 MED ORDER — SODIUM CHLORIDE 0.9 % IJ SOLN
3.0000 mL | Freq: Two times a day (BID) | INTRAMUSCULAR | Status: DC
  Administered 2012-03-18 – 2012-04-17 (×38): 3 mL via INTRAVENOUS

## 2012-03-18 NOTE — Progress Notes (Signed)
Name: Michael Ellison MRN: [id number] DOB: 1946/03/27    LOS: 23  PULMONARY / CRITICAL CARE MEDICINE  Pt Profile:   66 y/o male smoker admitted to [facility] 02/24/2012 with n/v and abdominal pain due to SBO due to adhesions.  Had ex lap with SB resection 5/10.  Developed delirium post-op.  Transferred to [facility] 5/14 for respiratory failure requiring intubation.  Developed abdominal fascial dehiscence and taken back to OR 5/16.  Failed extubation, and required tracheostomy 5/22. PMHx ETOH, HTN, Prostate cancer   Interval hx:  5/16: OR for fascial dehiscence repair- closed with wound vac 5/18- failed weaning 5/19- extubated 5/21- re-intubated for hypercarbia 5/22-improved co2 and neurostatus, pos almost 2 liters 5/22 SVT>>resolved spontaneously 5/30 -off vent for 24 hours, planned Panda placement 6/1> up in chair, improved.   Subjective: Off vent since 5/29, oob to chair, depressed affect  Vital Signs: Temp:  [97.2 F (36.2 C)-98.9 F (37.2 C)] 97.2 F (36.2 C) (06/01 0840) Pulse Rate:  [97-109] 102  (06/01 0840) Resp:  [21-37] 28  (06/01 0840) BP: (102-127)/(76-86) 122/84 mmHg (06/01 0840) SpO2:  [93 %-100 %] 93 % (06/01 0840) FiO2 (%):  [28 %] 28 % (06/01 0840) Weight:  [74.4 kg (164 lb 0.4 oz)] 74.4 kg (164 lb 0.4 oz) (06/01 0000)  Physical Examination: General - pleasant male, NAD in bed   HEENT - trach site clean Cardiac - s1s2 regular, no murmur Chest - few rhonchi, no wheeze.  Abd - midline open wound with wound vac in place, tender with mild palpation, +bs Ext - no edema Neuro - follows commands, alert    CBC  Lab 03/17/12 0518 03/16/12 0500 03/14/12 0435  HGB 10.4* 10.1* 9.8*  HCT 32.4* 31.2* 30.9*  WBC 12.4* 12.4* 12.1*  PLT 301 338 402*    BMET  Lab 03/17/12 0518 03/16/12 0500 03/15/12 0500 03/14/12 0435 03/13/12 0500  NA 135 135 136 138 141  K 3.8 4.0 -- -- --  CL 105 102 105 106 107  CO2 24 24 24 26 25   GLUCOSE 105* 138* 100* 110* 100*  BUN 18 21 23   26* 29*  CREATININE 0.59 0.63 0.69 0.76 0.82  CALCIUM 8.4 8.3* 8.4 8.4 8.4  MG -- 1.8 -- 2.0 2.1  PHOS -- 3.0 -- 2.8 2.9     Lab Results  Component Value Date   ALT 35 03/16/2012   AST 29 03/16/2012   ALKPHOS 104 03/16/2012   BILITOT 0.6 03/16/2012    Intake/Output Summary (Last 24 hours) at 03/18/12 1043 Last data filed at 03/18/12 1026  Gross per 24 hour  Intake   1847 ml  Output    600 ml  Net   1247 ml   Dg Abd 1 View  03/17/2012  *RADIOLOGY REPORT*  Clinical Data: Panda placement  ABDOMEN - 1 VIEW  Comparison: 02/29/2012  Findings: Panda tube tip is in the duodenum at the ligament of Treitz.  Contrast was injected filling the duodenum.  Duodenum is not dilated.  IMPRESSION: Panda tip at the ligament of Treitz.  Original Report Authenticated By: Camelia Phenes, M.D.   Dg Chest Port 1 View  03/17/2012  *RADIOLOGY REPORT*  Clinical Data: Respiratory failure.  PORTABLE CHEST - 1 VIEW  Comparison: Chest x-ray 03/13/2012.  Findings: A tracheostomy tube is in place with tip 6.9 cm above the carina. There is a left-sided subclavian central venous catheter with tip terminating in the mid superior vena cava. Previously noted nasogastric tube has been  removed.  The lung volumes are slightly low.  There are persistent bibasilar linear opacities which may reflect areas of atelectasis and/or scarring.  Bilateral apical bullous changes are again noted.  No definite pleural effusions.  No evidence of pulmonary edema.  Heart size is borderline enlarged. The patient is rotated to the left on today's exam, resulting in distortion of the mediastinal contours and reduced diagnostic sensitivity and specificity for mediastinal pathology.  Atherosclerotic calcifications within the arch of the aorta.  IMPRESSION: 1.  Support apparatus, as above. 2.  Persistent bibasilar opacities favored to represent areas of atelectasis and/or scarring. 3.  Atherosclerosis. 4.  Bullous emphysematous changes in the lung apices  again noted.  Original Report Authenticated By: Florencia Reasons, M.D.   Dg Naso G Tube Plc W/fl-no Rad  03/17/2012  CLINICAL DATA: place feeding tube   NASO G TUBE PLACEMENT WITH FLUORO  Fluoroscopy was utilized by the requesting physician.  No radiographic  interpretation.      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 5/30 CXR>> Small pleural effusions/ atx  Acute hypoxic/hypercapnic respiratory failure 2nd to delirium, aspiration pneumonia, 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  Failed extubation, and s/p trach 5/22.   Plan: -Continue ATC as tolerated --day & night -vent pulled from room 5/30 -continue schedule BD's -downsize to #6 cuffed 5/30   CARDIOVASCULAR  left [place] CVL 5/14>>> 5/31 Rt femoral aline 5/14>>>out  Septic/hypovolemic shock 2nd to SBO complicated by wound dehisence>>resolved.   HTN, Edema, Intermittent tachycardia Plan: -continue lasix 40 to keep in negative fluid balance -re-added metoprolol 5/23>>monitor for bradycardia -prn hydralazine   RENAL  Hypokalemia, hypernatremia resolved.   GASTROINTESTINAL  5/09 CT abd/pelvis>>SBO, RLQ transition point, small Rt inguinal hernia, diverticulosis 5/14 CT abd/pelvis>>ileus ABD Wound Vac 5/16>>  SBO s/p lap with SB resection 5/10 complicated by post-op ileus and fascial dehiscence, and return to OR 5/16. Plan: -post-op care per CCS- D/w Dr. Donell Beers - ok for swallow eval and po intake  -cont TF x 24h  -for FEES swallow eval per ST 5/30 -- place panda since he failed -dc TNA once tolerates PO/TF, dc CVL   HEMATOLOGIC  Anemia of critical illness.  Plan: -transfuse for Hb < 7 -f/u cbc    INFECTIOUS  Cultures: BCX2 5/14 >>1/2 gram neg rod>>> 1/2 Klebsiella  UC 5/14 >>Enterococcus  (sensitive to ampicillin) Sputum 5/14>> Klebsiella pneumoniae (pan sensitive) Wound 5/14>>Klebsiella Pneumoniae (pan sensitive) & Enterobacter Cloacae (resistant to ancef, cefoxitin) BC X2 5/20>>>ng Sputum 5/22>>>klebs, enterobacter  Antibiotics: vanc 5/14 >> 5/17 Zosyn 5/13 >>5/28  Wound infection, aspiration pneumonia, UTI. Plan: -monitor off abx    ENDOCRINE CBG (last 3)   Basename 03/18/12 0025 03/17/12 1203 03/17/12 0650  GLUCAP 91 116* 111*   Hyperglycemia. Plan: -SSI  NEUROLOGIC  Acute metabolic encephalopathy 2nd to sepsis, respiratory failure, post-op pain, and ETOH.  Much improved.  Intolerant of lorazepam (does okay with midazolam). Plan: -PRN fentanyl for pain  -limit benzo's and haldol -pt/ot when ok with [facility]CS    Depression / Anxiety Plan: -begin lexapro  5/31, hx of depression -was not on meds PTA (had stopped taking), ETOH abuse Daughter who is a  Child psychotherapist concerned about his depression   DISPO - has no LTAC benefits, plan for SNF/ rehab vs CIR  Will hold labs for am 6/1, f/u cbc, bmp and CXR am of 6/2   [name], NP-C [facility] Pgr: [phone] or [phone]  Independently examined pt, evaluated data & formulated above care plan with NP  Mckinnley Cottier M 03/18/12  @  10:43AM

## 2012-03-18 NOTE — Progress Notes (Signed)
16 Days Post-Op  Subjective: No issues. Tolerating tube feeds - no residuals. Several BMs yesterday per pt  Objective: Vital signs in last 24 hours: Temp:  [97.2 F (36.2 C)-98.9 F (37.2 C)] 97.2 F (36.2 C) (06/01 0840) Pulse Rate:  [97-109] 102  (06/01 0840) Resp:  [21-37] 28  (06/01 0840) BP: (102-127)/(76-86) 122/84 mmHg (06/01 0840) SpO2:  [93 %-100 %] 93 % (06/01 0840) FiO2 (%):  [28 %] 28 % (06/01 0840) Weight:  [164 lb 0.4 oz (74.4 kg)] 164 lb 0.4 oz (74.4 kg) (06/01 0000) Last BM Date: 03/17/12  Intake/Output from previous day: 05/31 0701 - 06/01 0700 In: 1734 [I.V.:210.7; NG/GT:520; TPN:1003.3] Out: 900 [Urine:900] Intake/Output this shift:    Alert, sitting in chair A little coarse Soft, nd, wound vac & retention sutures intact  Lab Results:   Basename 03/17/12 0518 03/16/12 0500  WBC 12.4* 12.4*  HGB 10.4* 10.1*  HCT 32.4* 31.2*  PLT 301 338   BMET  Basename 03/17/12 0518 03/16/12 0500  NA 135 135  K 3.8 4.0  CL 105 102  CO2 24 24  GLUCOSE 105* 138*  BUN 18 21  CREATININE 0.59 0.63  CALCIUM 8.4 8.3*   PT/INR No results found for this basename: LABPROT:2,INR:2 in the last 72 hours ABG No results found for this basename: PHART:2,PCO2:2,PO2:2,HCO3:2 in the last 72 hours  Studies/Results: Dg Abd 1 View  03/17/2012  *RADIOLOGY REPORT*  Clinical Data: Panda placement  ABDOMEN - 1 VIEW  Comparison: 02/29/2012  Findings: Panda tube tip is in the duodenum at the ligament of Treitz.  Contrast was injected filling the duodenum.  Duodenum is not dilated.  IMPRESSION: Panda tip at the ligament of Treitz.  Original Report Authenticated By: Camelia Phenes, M.D.   Dg Chest Port 1 View  03/17/2012  *RADIOLOGY REPORT*  Clinical Data: Respiratory failure.  PORTABLE CHEST - 1 VIEW  Comparison: Chest x-ray 03/13/2012.  Findings: A tracheostomy tube is in place with tip 6.9 cm above the carina. There is a left-sided subclavian central venous catheter with tip  terminating in the mid superior vena cava. Previously noted nasogastric tube has been removed.  The lung volumes are slightly low.  There are persistent bibasilar linear opacities which may reflect areas of atelectasis and/or scarring.  Bilateral apical bullous changes are again noted.  No definite pleural effusions.  No evidence of pulmonary edema.  Heart size is borderline enlarged. The patient is rotated to the left on today's exam, resulting in distortion of the mediastinal contours and reduced diagnostic sensitivity and specificity for mediastinal pathology.  Atherosclerotic calcifications within the arch of the aorta.  IMPRESSION: 1.  Support apparatus, as above. 2.  Persistent bibasilar opacities favored to represent areas of atelectasis and/or scarring. 3.  Atherosclerosis. 4.  Bullous emphysematous changes in the lung apices again noted.  Original Report Authenticated By: Florencia Reasons, M.D.   Dg Naso G Tube Plc W/fl-no Rad  03/17/2012  CLINICAL DATA: place feeding tube   NASO G TUBE PLACEMENT WITH FLUORO  Fluoroscopy was utilized by the requesting physician.  No radiographic  interpretation.      Anti-infectives: Anti-infectives     Start     Dose/Rate Route Frequency Ordered Stop   03/03/12 1800   vancomycin (VANCOCIN) 1,750 mg in sodium chloride 0.9 % 500 mL IVPB  Status:  Discontinued        1,750 mg 250 mL/hr over 120 Minutes Intravenous Every 12 hours 03/03/12 0628 03/03/12 4782  02/29/12 1900   micafungin (MYCAMINE) 100 mg in sodium chloride 0.9 % 100 mL IVPB  Status:  Discontinued        100 mg 100 mL/hr over 1 Hours Intravenous Every 24 hours 02/29/12 1754 03/01/12 0933   02/29/12 1730   vancomycin (VANCOCIN) 1,250 mg in sodium chloride 0.9 % 250 mL IVPB  Status:  Discontinued        1,250 mg 166.7 mL/hr over 90 Minutes Intravenous Every 12 hours 02/29/12 1644 03/03/12 0628   02/29/12 1715   vancomycin (VANCOCIN) IVPB 1000 mg/200 mL premix  Status:  Discontinued         1,000 mg 200 mL/hr over 60 Minutes Intravenous  Once 02/29/12 1637 02/29/12 1647   02/29/12 1645   piperacillin-tazobactam (ZOSYN) IVPB 3.375 g  Status:  Discontinued        3.375 g 100 mL/hr over 30 Minutes Intravenous  Once 02/29/12 1637 02/29/12 1641   02/28/12 1800   piperacillin-tazobactam (ZOSYN) IVPB 3.375 g  Status:  Discontinued        3.375 g 12.5 mL/hr over 240 Minutes Intravenous 4 times per day 02/28/12 1454 02/28/12 1510   02/28/12 1800   piperacillin-tazobactam (ZOSYN) IVPB 3.375 g  Status:  Discontinued        3.375 g 12.5 mL/hr over 240 Minutes Intravenous Every 8 hours 02/28/12 1510 03/14/12 1700   02/25/12 1109   dextrose 5 % with cefOXitin (MEFOXIN) ADS Med     Comments: HOLLIE, CHRISTINA: cabinet override         02/25/12 1109 02/25/12 2314   02/25/12 1100   cefOXitin (MEFOXIN) 1 g in dextrose 5 % 50 mL IVPB  Status:  Discontinued        1 g 100 mL/hr over 30 Minutes Intravenous 60 min pre-op 02/25/12 1100 02/25/12 1542          Assessment/Plan: s/p Procedure(s) (LRB): DEBRIDEMENT CLOSURE/ABDOMINAL WOUND (N/A)  According to nutrition note, goal TF is 68ml/hr of osmolite 1.2; currently getting 40cc/hr. Will defer to CCM, but recommend adv TF to goal If needed can d/c wound vac and switch to wet-dry dressing  Mary Sella. Andrey Campanile, MD, FACS General, Bariatric, & Minimally Invasive Surgery [facility], [place]   LOS: 23 days    Atilano Ina 03/18/2012

## 2012-03-19 ENCOUNTER — Inpatient Hospital Stay (HOSPITAL_COMMUNITY): Payer: Medicare Other

## 2012-03-19 LAB — BASIC METABOLIC PANEL
BUN: 17 mg/dL (ref 6–23)
CO2: 23 mEq/L (ref 19–32)
Calcium: 8.4 mg/dL (ref 8.4–10.5)
Chloride: 104 mEq/L (ref 96–112)
Creatinine, Ser: 0.69 mg/dL (ref 0.50–1.35)
GFR calc Af Amer: >90 mL/min (ref >90)
GFR calc non Af Amer: >90 mL/min (ref >90)
Glucose, Bld: 109 mg/dL — ABNORMAL HIGH (ref 70–99)
Potassium: 3.7 mEq/L (ref 3.5–5.1)
Sodium: 136 mEq/L (ref 135–145)

## 2012-03-19 LAB — CBC
Hemoglobin: 11.3 g/dL — ABNORMAL LOW (ref 13.0–17.0)
MCH: 29.2 pg (ref 26.0–34.0)
MCHC: 32.9 g/dL (ref 30.0–36.0)
MCV: 88.6 fL (ref 78.0–100.0)

## 2012-03-19 MED ORDER — WHITE PETROLATUM GEL
Status: AC
  Administered 2012-03-19: 1
  Filled 2012-03-19: qty 5

## 2012-03-19 MED ORDER — PHENOL 1.4 % MT LIQD
1.0000 | OROMUCOSAL | Status: DC | PRN
  Administered 2012-03-19 – 2012-04-18 (×3): 1 via OROMUCOSAL
  Filled 2012-03-19: qty 177

## 2012-03-19 NOTE — Progress Notes (Signed)
Name: Michael Ellison MRN: [id number] DOB: Dec 31, 1945    LOS: 24  PULMONARY / CRITICAL CARE MEDICINE  Pt Profile:   65 y/o male smoker admitted to [facility] 02/24/2012 with n/v and abdominal pain due to SBO due to adhesions.  Had ex lap with SB resection 5/10.  Developed delirium post-op.  Transferred to [facility] 5/14 for respiratory failure requiring intubation.  Developed abdominal fascial dehiscence and taken back to OR 5/16.  Failed extubation, and required tracheostomy 5/22. PMHx ETOH, HTN, Prostate cancer   Interval hx:  5/16: OR for fascial dehiscence repair- closed with wound vac 5/18- failed weaning 5/19- extubated 5/21- re-intubated for hypercarbia 5/22-improved co2 and neurostatus, pos almost 2 liters 5/22 SVT>>resolved spontaneously 5/30 -off vent for 24 hours, planned Panda placement 6/1> up in chair, improved.   Subjective: Off vent since 5/29, oob to chair, depressed affect  Vital Signs: Temp:  [98 F (36.7 C)-99.1 F (37.3 C)] 98.2 F (36.8 C) (06/02 0800) Pulse Rate:  [98-109] 108  (06/02 0840) Resp:  [26-41] 29  (06/02 0840) BP: (104-141)/(72-88) 123/72 mmHg (06/02 0840) SpO2:  [90 %-100 %] 100 % (06/02 0840) FiO2 (%):  [28 %] 28 % (06/02 0840) Weight:  [73 kg (160 lb 15 oz)] 73 kg (160 lb 15 oz) (06/02 0025)  Physical Examination: General - pleasant male, NAD in bed   HEENT - trach site clean Cardiac - s1s2 regular, no murmur Chest - few rhonchi, no wheeze.  Abd - midline open wound with wound vac in place, tender with mild palpation, +bs Ext - no edema Neuro - follows commands, alert    CBC  Lab 03/19/12 0425 03/17/12 0518 03/16/12 0500  HGB 11.3* 10.4* 10.1*  HCT 34.3* 32.4* 31.2*  WBC 14.0* 12.4* 12.4*  PLT 338 301 338    BMET  Lab 03/19/12 0425 03/17/12 0518 03/16/12 0500 03/15/12 0500 03/14/12 0435 03/13/12 0500  NA 136 135 135 136 138 --  K 3.7 3.8 -- -- -- --  CL 104 105 102 105 106 --  CO2 23 24 24 24 26  --  GLUCOSE 109* 105* 138* 100*  110* --  BUN 17 18 21 23  26* --  CREATININE 0.69 0.59 0.63 0.69 0.76 --  CALCIUM 8.4 8.4 8.3* 8.4 8.4 --  MG -- -- 1.8 -- 2.0 2.1  PHOS -- -- 3.0 -- 2.8 2.9     Lab Results  Component Value Date   ALT 35 03/16/2012   AST 29 03/16/2012   ALKPHOS 104 03/16/2012   BILITOT 0.6 03/16/2012    Intake/Output Summary (Last 24 hours) at 03/19/12 1003 Last data filed at 03/19/12 0800  Gross per 24 hour  Intake   1168 ml  Output   1125 ml  Net     43 ml   Dg Chest Port 1 View  03/19/2012  *RADIOLOGY REPORT*  Clinical Data: follow up pleural effusions  PORTABLE CHEST - 1 VIEW  Comparison: 03/17/2012  Findings: ET tube tip is above the carina.  There is an endotracheal tube with tip below the field of view.  Heart size appears normal.  Bilateral lower lobe opacities are unchanged from previous exam.  Large bulla is noted within the left apex.  Similar to previous exam.  IMPRESSION:  1.  No change in bilateral lower lobe opacities.  Original Report Authenticated By: Rosealee Albee, M.D.    Medications reviewed   ASSESSMENT AND PLAN  PULMONARY  ETT:  5/14>>>5/19 >>> 5/21 Trach (DF) 5/22>>  5/14 CT chest>> b/l effusions and basilar ATX/consolidation, severe bullous lung disease with emphysema in upper lobes b/l. 5/30 CXR>> Small pleural effusions/ atx 6/2 CXR>> large bulla left apex, unchanged bilat LL opac, no change.  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  Failed extubation, and s/p trach 5/22.   Plan: -Continue ATC as tolerated --day & night -vent pulled from room 5/30 -continue schedule BD's -downsize to #6 cuffed 5/30   CARDIOVASCULAR  left [place] CVL 5/14>>> 5/31 Rt femoral aline 5/14>>>out  Septic/hypovolemic shock 2nd to SBO complicated by wound dehisence>>resolved.   HTN, Edema, Intermittent tachycardia Plan: -continue lasix 40 to keep in negative fluid  balance -re-added metoprolol 5/23>>monitor for bradycardia -prn hydralazine   RENAL  Hypokalemia, hypernatremia resolved.   GASTROINTESTINAL  5/09 CT abd/pelvis>>SBO, RLQ transition point, small Rt inguinal hernia, diverticulosis 5/14 CT abd/pelvis>>ileus ABD Wound Vac 5/16>>  SBO s/p lap with SB resection 5/10 complicated by post-op ileus and fascial dehiscence, and return to OR 5/16. Plan: -post-op care per [facility]- D/w Dr. Donell Beers - ok for swallow eval and po intake  -cont TF x 24h  -for FEES swallow eval per ST 5/30 -- place panda since he failed -dc TNA once tolerates PO/TF, dc CVL   HEMATOLOGIC  Anemia of critical illness.  Plan: -transfuse for Hb < 7 -f/u cbc    INFECTIOUS  Cultures: BCX2 5/14 >>1/2 gram neg rod>>> 1/2 Klebsiella  UC 5/14 >>Enterococcus (sensitive to ampicillin) Sputum 5/14>> Klebsiella pneumoniae (pan sensitive) Wound 5/14>>Klebsiella Pneumoniae (pan sensitive) & Enterobacter Cloacae (resistant to ancef, cefoxitin) BC X2 5/20>>>ng Sputum 5/22>>>klebs, enterobacter  Antibiotics: vanc 5/14 >> 5/17 Zosyn 5/13 >>5/28  Wound infection, aspiration pneumonia, UTI. Plan: -monitor off abx    ENDOCRINE CBG (last 3)   Basename 03/18/12 0025 03/17/12 1203 03/17/12 0650  GLUCAP 91 116* 111*   Hyperglycemia. Plan: -SSI  NEUROLOGIC  Acute metabolic encephalopathy 2nd to sepsis, respiratory failure, post-op pain, and ETOH.  Much improved.  Intolerant of lorazepam (does okay with midazolam). Plan: -PRN fentanyl for pain  -limit benzo's and haldol -pt/ot when ok with CCS    Depression / Anxiety Plan: -begin lexapro  5/31, hx of depression -was not on meds PTA (had stopped taking), ETOH abuse Daughter who is a  Child psychotherapist concerned about his depression   DISPO - has no LTAC benefits, plan for SNF/ rehab vs CIR   Bobetta Korf M 03/19/12  @  10:03AM

## 2012-03-19 NOTE — Progress Notes (Signed)
17 Days Post-Op  Subjective: Looks well.  No complaints.  Objective: Vital signs in last 24 hours: Temp:  [97.2 F (36.2 C)-99.1 F (37.3 C)] 98.2 F (36.8 C) (06/02 0800) Pulse Rate:  [98-109] 108  (06/02 0800) Resp:  [26-41] 34  (06/02 0800) BP: (104-141)/(72-88) 123/72 mmHg (06/02 0800) SpO2:  [90 %-99 %] 96 % (06/02 0800) FiO2 (%):  [28 %] 28 % (06/02 0800) Weight:  [160 lb 15 oz (73 kg)] 160 lb 15 oz (73 kg) (06/02 0025) Last BM Date: 03/18/12  Intake/Output from previous day: 06/01 0701 - 06/02 0700 In: 1318 [I.V.:233; NG/GT:960] Out: 1500 [Urine:1200; Drains:300] Intake/Output this shift: Total I/O In: -  Out: 25 [Urine:25]  General appearance: alert and cooperative Incision/Wound: vac in place.    Lab Results:   Basename 03/19/12 0425 03/17/12 0518  WBC 14.0* 12.4*  HGB 11.3* 10.4*  HCT 34.3* 32.4*  PLT 338 301   BMET  Basename 03/19/12 0425 03/17/12 0518  NA 136 135  K 3.7 3.8  CL 104 105  CO2 23 24  GLUCOSE 109* 105*  BUN 17 18  CREATININE 0.69 0.59  CALCIUM 8.4 8.4   PT/INR No results found for this basename: LABPROT:2,INR:2 in the last 72 hours ABG No results found for this basename: PHART:2,PCO2:2,PO2:2,HCO3:2 in the last 72 hours  Studies/Results: Dg Abd 1 View  03/17/2012  *RADIOLOGY REPORT*  Clinical Data: Panda placement  ABDOMEN - 1 VIEW  Comparison: 02/29/2012  Findings: Panda tube tip is in the duodenum at the ligament of Treitz.  Contrast was injected filling the duodenum.  Duodenum is not dilated.  IMPRESSION: Panda tip at the ligament of Treitz.  Original Report Authenticated By: Camelia Phenes, M.D.   Dg Chest Port 1 View  03/19/2012  *RADIOLOGY REPORT*  Clinical Data: follow up pleural effusions  PORTABLE CHEST - 1 VIEW  Comparison: 03/17/2012  Findings: ET tube tip is above the carina.  There is an endotracheal tube with tip below the field of view.  Heart size appears normal.  Bilateral lower lobe opacities are unchanged from  previous exam.  Large bulla is noted within the left apex.  Similar to previous exam.  IMPRESSION:  1.  No change in bilateral lower lobe opacities.  Original Report Authenticated By: Rosealee Albee, M.D.   Dg Naso G Tube Plc W/fl-no Rad  03/17/2012  CLINICAL DATA: place feeding tube   NASO G TUBE PLACEMENT WITH FLUORO  Fluoroscopy was utilized by the requesting physician.  No radiographic  interpretation.      Anti-infectives: Anti-infectives     Start     Dose/Rate Route Frequency Ordered Stop   03/03/12 1800   vancomycin (VANCOCIN) 1,750 mg in sodium chloride 0.9 % 500 mL IVPB  Status:  Discontinued        1,750 mg 250 mL/hr over 120 Minutes Intravenous Every 12 hours 03/03/12 0628 03/03/12 0918   02/29/12 1900   micafungin (MYCAMINE) 100 mg in sodium chloride 0.9 % 100 mL IVPB  Status:  Discontinued        100 mg 100 mL/hr over 1 Hours Intravenous Every 24 hours 02/29/12 1754 03/01/12 0933   02/29/12 1730   vancomycin (VANCOCIN) 1,250 mg in sodium chloride 0.9 % 250 mL IVPB  Status:  Discontinued        1,250 mg 166.7 mL/hr over 90 Minutes Intravenous Every 12 hours 02/29/12 1644 03/03/12 0628   02/29/12 1715   vancomycin (VANCOCIN) IVPB 1000 mg/200 mL  premix  Status:  Discontinued        1,000 mg 200 mL/hr over 60 Minutes Intravenous  Once 02/29/12 1637 02/29/12 1647   02/29/12 1645   piperacillin-tazobactam (ZOSYN) IVPB 3.375 g  Status:  Discontinued        3.375 g 100 mL/hr over 30 Minutes Intravenous  Once 02/29/12 1637 02/29/12 1641   02/28/12 1800   piperacillin-tazobactam (ZOSYN) IVPB 3.375 g  Status:  Discontinued        3.375 g 12.5 mL/hr over 240 Minutes Intravenous 4 times per day 02/28/12 1454 02/28/12 1510   02/28/12 1800   piperacillin-tazobactam (ZOSYN) IVPB 3.375 g  Status:  Discontinued        3.375 g 12.5 mL/hr over 240 Minutes Intravenous Every 8 hours 02/28/12 1510 03/14/12 1700   02/25/12 1109   dextrose 5 % with cefOXitin (MEFOXIN) ADS Med      Comments: HOLLIE, CHRISTINA: cabinet override         02/25/12 1109 02/25/12 2314   02/25/12 1100   cefOXitin (MEFOXIN) 1 g in dextrose 5 % 50 mL IVPB  Status:  Discontinued        1 g 100 mL/hr over 30 Minutes Intravenous 60 min pre-op 02/25/12 1100 02/25/12 1542          Assessment/Plan: s/p Procedure(s) (LRB): DEBRIDEMENT CLOSURE/ABDOMINAL WOUND (N/A) Looks well.  No changes.  Seems to be tolerating tube feeds well.  LOS: 24 days    Ilan Kahrs A. 03/19/2012

## 2012-03-20 ENCOUNTER — Other Ambulatory Visit: Payer: Self-pay

## 2012-03-20 ENCOUNTER — Inpatient Hospital Stay (HOSPITAL_COMMUNITY): Payer: Medicare Other

## 2012-03-20 LAB — BLOOD GAS, ARTERIAL
Acid-Base Excess: 2.2 mmol/L — ABNORMAL HIGH (ref 0.0–2.0)
Acid-Base Excess: 2.2 mmol/L — ABNORMAL HIGH (ref 0.0–2.0)
Bicarbonate: 25.5 mEq/L — ABNORMAL HIGH (ref 20.0–24.0)
Drawn by: 347621
Drawn by: 296031
FIO2: 0.6 %
MECHVT: 600 mL
O2 Saturation: 84.4 %
O2 Saturation: 98.1 %
PEEP: 5 cmH2O
Patient temperature: 98.6
RATE: 12 resp/min
TCO2: 26.5 mmol/L (ref 0–100)
pO2, Arterial: 48.9 mmHg — ABNORMAL LOW (ref 80.0–100.0)

## 2012-03-20 LAB — CBC
MCH: 29.5 pg (ref 26.0–34.0)
MCHC: 33.7 g/dL (ref 30.0–36.0)
MCV: 87.6 fL (ref 78.0–100.0)
Platelets: 365 10*3/uL (ref 150–400)

## 2012-03-20 MED ORDER — FUROSEMIDE 10 MG/ML IJ SOLN
80.0000 mg | Freq: Once | INTRAMUSCULAR | Status: AC
  Administered 2012-03-20: 80 mg via INTRAVENOUS
  Filled 2012-03-20 (×2): qty 8

## 2012-03-20 MED ORDER — POTASSIUM CHLORIDE 10 MEQ/100ML IV SOLN
10.0000 meq | INTRAVENOUS | Status: AC
  Administered 2012-03-20 (×2): 10 meq via INTRAVENOUS
  Filled 2012-03-20 (×2): qty 100

## 2012-03-20 MED ORDER — OSMOLITE 1.2 CAL PO LIQD
1000.0000 mL | ORAL | Status: DC
  Administered 2012-03-21: 1000 mL
  Filled 2012-03-20 (×3): qty 1000

## 2012-03-20 NOTE — Progress Notes (Signed)
Occupational Therapy Treatment Patient Details Name: Michael Ellison MRN: [id number] DOB: 1946/02/06 Today's Date: 03/20/2012 Time: 4098-1191 OT Time Calculation (min): 13 min  OT Assessment / Plan / Recommendation Comments on Treatment Session Session limited due to pt currently back on vent. Still mobilizing well.    Follow Up Recommendations  Skilled nursing facility    Barriers to Discharge       Equipment Recommendations  Defer to next venue    Recommendations for Other Services    Frequency Min 2X/week   Plan Discharge plan remains appropriate    Precautions / Restrictions Precautions Precautions: Fall Restrictions Weight Bearing Restrictions: No   Pertinent Vitals/Pain     ADL  Grooming: Performed;Wash/dry face;Set up Where Assessed - Grooming: Unsupported sitting Toilet Transfer: Simulated;Min guard (+ 2 for lines and leads.) Toilet Transfer Method: Stand pivot Toilet Transfer Equipment: Other (comment) (to recliner.) ADL Comments: Tx limited today due to pt back on vent.    OT Diagnosis:    OT Problem List:   OT Treatment Interventions:     OT Goals ADL Goals ADL Goal: Grooming - Progress: Progressing toward goals ADL Goal: Toilet Transfer - Progress: Progressing toward goals  Visit Information  Last OT Received On: 03/20/12 Assistance Needed: +2 (for lines) PT/OT Co-Evaluation/Treatment: Yes    Subjective Data  Subjective: Pt back on vent but was able to mouth answers to simple yes/no questions.   Prior Functioning       Cognition  Overall Cognitive Status: Appears within functional limits for tasks assessed/performed Arousal/Alertness: Awake/alert Orientation Level: Appears intact for tasks assessed Behavior During Session: [facility] for tasks performed    Mobility Bed Mobility Supine to Sit: 4: Min guard;With rails;HOB elevated Sitting - Scoot to Edge of Bed: 5: Supervision Transfers Sit to Stand: 4: Min assist;With upper extremity assist;From  bed Stand to Sit: 4: Min assist;With upper extremity assist;With armrests;To chair/3-in-1 Details for Transfer Assistance: min VCs for hand placement.   Exercises    Balance Static Sitting Balance Static Sitting - Balance Support: No upper extremity supported;Feet supported Static Sitting - Level of Assistance: 6: Modified independent (Device/Increase time) Static Standing Balance Static Standing - Balance Support: Bilateral upper extremity supported;During functional activity Static Standing - Level of Assistance: 4: Min assist  End of Session OT - End of Session Activity Tolerance: Patient limited by fatigue Patient left: in chair;with call bell/phone within reach   Jasalyn Frysinger A OTR/L [phone] 03/20/2012, 12:05 PM

## 2012-03-20 NOTE — Progress Notes (Signed)
Dr. Wynn Banker consulted for inpt rehab on 03/17/12. Patient not felt to be a candidate for inpt rehab at this time. Ltach recommended, but no Ltach benefits with [facility] therefore recommended. Please call for any questions. [phone].

## 2012-03-20 NOTE — Progress Notes (Signed)
Physical Therapy Treatment Patient Details Name: Michael Ellison MRN: [id number] DOB: 1946/06/24 Today's Date: 03/20/2012 Time: 1050-1108 PT Time Calculation (min): 18 min  PT Assessment / Plan / Recommendation Comments on Treatment Session  Patient s/p SB resection with fascial dehiscence with VDRF and resultant trach. Will continue to work on addressing patient's strength and endurance for activity.  Patient back on vent today and treatment limited to transfers per nursing request.      Follow Up Recommendations  Skilled nursing facility;Supervision/Assistance - 24 hour    Barriers to Discharge  None      Equipment Recommendations  Defer to next venue    Recommendations for Other Services  None  Frequency Min 3X/week   Plan Discharge plan remains appropriate;Frequency remains appropriate    Precautions / Restrictions Precautions Precautions: Fall Precaution Comments: trach with vent support Restrictions Weight Bearing Restrictions: No   Pertinent Vitals/Pain VSS, Some pain    Mobility  Bed Mobility Bed Mobility: Rolling Right;Right Sidelying to Sit;Sitting - Scoot to [place] of Bed Rolling Right: 4: Min assist Right Sidelying to Sit: 4: Min assist;HOB flat Supine to Sit: Not tested (comment) Sitting - Scoot to Edge of Bed: 5: Supervision Sit to Supine: Not Tested (comment) Details for Bed Mobility Assistance: cues for technique and hand placement Transfers Transfers: Sit to Stand;Stand to Sit;Stand Pivot Transfers Sit to Stand: 4: Min assist;With upper extremity assist;From bed Stand to Sit: 4: Min assist;With upper extremity assist;With armrests;To chair/3-in-1 Stand Pivot Transfers: 4: Min assist;With armrests Details for Transfer Assistance: min verbal cues for hand placement.  Patient stood and pivoted to chair only secondary to with ventilator support.  Nursing ok'd OOB to chair only.   Ambulation/Gait Ambulation/Gait Assistance: Not tested (comment) Stairs:  No Wheelchair Mobility Wheelchair Mobility: No    Exercises General Exercises - Lower Extremity Ankle Circles/Pumps: AROM;5 reps;Seated Long Arc Quad: AROM;Both;5 reps;Seated   PT Goals Acute Rehab PT Goals PT Goal: Supine/Side to Sit - Progress: Progressing toward goal PT Goal: Sit at Edge Of Bed - Progress: Progressing toward goal PT Goal: Sit to Stand - Progress: Progressing toward goal PT Goal: Stand to Sit - Progress: Progressing toward goal PT Transfer Goal: Bed to Chair/Chair to Bed - Progress: Progressing toward goal  Visit Information  Last PT Received On: 03/20/12 Assistance Needed: +2 PT/OT Co-Evaluation/Treatment: Yes    Subjective Data  Subjective: Patient with vent with trach - no verbalization   Cognition  Overall Cognitive Status: Appears within functional limits for tasks assessed/performed Difficult to assess due to: Tracheostomy Arousal/Alertness: Awake/alert Orientation Level: Appears intact for tasks assessed Behavior During Session: [facility] for tasks performed    Balance  Balance Balance Assessed: Yes Static Sitting Balance Static Sitting - Balance Support: No upper extremity supported;Feet supported Static Sitting - Level of Assistance: 6: Modified independent (Device/Increase time) Static Sitting - Comment/# of Minutes: 5 Static Standing Balance Static Standing - Balance Support: Bilateral upper extremity supported;During functional activity Static Standing - Level of Assistance: 4: Min assist  End of Session PT - End of Session Equipment Utilized During Treatment: Gait belt Activity Tolerance: Patient tolerated treatment well Patient left: in chair;with call bell/phone within reach Nurse Communication: Mobility status    INGOLD,Waverly Chavarria 03/20/2012, 2:46 PM  [facility] [phone] (pager)

## 2012-03-20 NOTE — Procedures (Signed)
Trach change Removed 6 cuffed after balloon deflation. Placed 8 extra long cuff. Had no further leak. Pos cap Equal BS, sats good Tolerated well  Mcarthur Rossetti. [name], MD, FACP Pgr: [phone] [facility]

## 2012-03-20 NOTE — Progress Notes (Signed)
Abd wound vac drsg change done.  Small pieces of black foam remain in pt's wound under the top retention suture.  Attempted to remove with tweezers but foam kept tearing into smaller pieces.  Drsg changed finished and wv back on 125 cont sx.  Requesting surgery to look at wound when available.  03/20/2012 5:42 PM Marielis Samara, [place]urtis Sink

## 2012-03-20 NOTE — Evaluation (Signed)
Bedside evaluation for hypoxemia and increased oxygen requirement. Patient with RR in the 40's, O2 sat 90%, complains of SOB, no CP. On exam bilateral basilar crackles. EKG showed sinus tachycardia, no ST or T wave changes, significant LVH. Positive balance over the last few days. Chest X ray with no major changes. Saturation improved to 98% after bagging and intensive suctioning of thick secretions. Secretions are likely the cause but could also be fluid overload and atelectasis. Plan: 1) Improved after suctioning of thick secretions 2) Will give a trial of lasix 80 mg once. 3) Will consider positive pressure ventilation if persistent hypoxemia and distress.  Overton Mam, MD PCCM

## 2012-03-20 NOTE — Progress Notes (Signed)
RT called to Pt room due to O2 Sats decreased to 85%.  RT lavaged, Bagged and suctioned pt. Albuterol Neb tx given. Pt. FI02 is 80% ATC and maintaining O2 Sat of 91%. RN in room and aware.

## 2012-03-20 NOTE — Procedures (Signed)
Tracheostomy Change Note  Patient Details:   Name: Michael Ellison DOB: 09-05-1946 MRN: [id number]    Airway Documentation: Janina Mayo changed per [name] due to air leak while on vent. Hyperoxygenated pt pre and post procedure.Trach changed from #6.0 cuffed to #6.0 cuffed XL. Pt tolerated well. Return VT matched. RT will continue to monitor.  Evaluation  O2 sats: stable throughout and currently acceptable Complications: No apparent complications Patient did tolerate procedure well. Bilateral Breath Sounds: Rhonchi Suctioning: Airway  Christie Beckers 03/20/2012, 2:35 PM

## 2012-03-20 NOTE — Progress Notes (Signed)
eLink Physician-Brief Progress Note Patient Name: Michael Ellison DOB: September 24, 1946 MRN: [id number]  Date of Service  03/20/2012   HPI/Events of Note  Call from floor nurse and RT reporting increased resp distress in patient s/p trach/COPD.  Current sats of 87 to 90% despite suctioning/breathing treatments/increase in O2 requirements.  Lungs sounds are clearer than before.  No improvement with increase in O2 or breathing treatments.  Last ABG several days ago.  [place] HD stable but RR is up in the mid30s.   eICU Interventions  Plan: PCXR ABG Plans per results of these studies.   Intervention Category Intermediate Interventions: Respiratory distress - evaluation and management  Indy Kuck 03/20/2012, 1:34 AM

## 2012-03-20 NOTE — Progress Notes (Signed)
Patient complained of shortness of breath, o2 sats decreased to 85% on 28% trach collar. Patient alert and oriented. RN suctioned patient and received small amount of thick tan secretions. Oxygen increased to 60%, o2 sats reading 88%, respiratory therapy notified and E-link called. Orders received from E. Deterding.

## 2012-03-20 NOTE — Progress Notes (Signed)
Name: Michael Ellison MRN: [id number] DOB: 1945/11/04    LOS: 25  PULMONARY / CRITICAL CARE MEDICINE  Pt Profile:   66 y/o male smoker admitted to [facility] 02/24/2012 with n/v and abdominal pain due to SBO due to adhesions.  Had ex lap with SB resection 5/10.  Developed delirium post-op.  Transferred to [facility] 5/14 for respiratory failure requiring intubation.  Developed abdominal fascial dehiscence and taken back to OR 5/16.  Failed extubation, and required tracheostomy 5/22. PMHx ETOH, HTN, Prostate cancer   Interval hx:  5/16: OR for fascial dehiscence repair- closed with wound vac 5/18- failed weaning 5/19- extubated 5/21- re-intubated for hypercarbia 5/22-improved co2 and neurostatus, pos almost 2 liters 5/22 SVT>>resolved spontaneously 5/30 -off vent for 24 hours, planned Panda placement 6/1> up in chair, improved. 6/3- desat, increased WOB, vent  Subjective: 6/3- desat, increased WOB, vent  Vital Signs: Temp:  [97.5 F (36.4 C)-98.6 F (37 C)] 98.6 F (37 C) (06/03 0400) Pulse Rate:  [98-116] 112  (06/03 0843) Resp:  [22-45] 30  (06/03 0843) BP: (107-135)/(72-91) 114/79 mmHg (06/03 0843) SpO2:  [88 %-100 %] 99 % (06/03 0843) FiO2 (%):  [28 %-80 %] 60 % (06/03 0843)  Physical Examination: General - pleasant male, in chair on vent HEENT - trach site clean Cardiac - s1s2 regular, no murmur Chest - coarse, reduced Abd - midline open wound with wound vac in place, tender with mild palpation, +bs Ext - no edema Neuro - follows commands, alert   CBC  Lab 03/19/12 0425 03/17/12 0518 03/16/12 0500  HGB 11.3* 10.4* 10.1*  HCT 34.3* 32.4* 31.2*  WBC 14.0* 12.4* 12.4*  PLT 338 301 338    BMET  Lab 03/19/12 0425 03/17/12 0518 03/16/12 0500 03/15/12 0500 03/14/12 0435  NA 136 135 135 136 138  K 3.7 3.8 -- -- --  CL 104 105 102 105 106  CO2 23 24 24 24 26   GLUCOSE 109* 105* 138* 100* 110*  BUN 17 18 21 23  26*  CREATININE 0.69 0.59 0.63 0.69 0.76  CALCIUM 8.4 8.4 8.3*  8.4 8.4  MG -- -- 1.8 -- 2.0  PHOS -- -- 3.0 -- 2.8     Lab Results  Component Value Date   ALT 35 03/16/2012   AST 29 03/16/2012   ALKPHOS 104 03/16/2012   BILITOT 0.6 03/16/2012    Intake/Output Summary (Last 24 hours) at 03/20/12 1134 Last data filed at 03/20/12 1000  Gross per 24 hour  Intake    880 ml  Output   1650 ml  Net   -770 ml   Dg Chest Port 1 View  03/20/2012  *RADIOLOGY REPORT*  Clinical Data: Respiratory distress.  PORTABLE CHEST - 1 VIEW  Comparison: 03/19/2012.  Findings: The tracheostomy tube and Panda tubes are stable.  The cardiac silhouette, mediastinal and hilar contours are stable. Stable severe underlying emphysematous changes with apical bulla. Persistent bibasilar lung opacities, most likely atelectasis.  No definite pleural effusions or pneumothorax.  IMPRESSION: Stable chest x-ray findings.  Original Report Authenticated By: P. Loralie Champagne, M.D.   Dg Chest Port 1 View  03/19/2012  *RADIOLOGY REPORT*  Clinical Data: follow up pleural effusions  PORTABLE CHEST - 1 VIEW  Comparison: 03/17/2012  Findings: ET tube tip is above the carina.  There is an endotracheal tube with tip below the field of view.  Heart size appears normal.  Bilateral lower lobe opacities are unchanged from previous exam.  Large bulla is noted within  the left apex.  Similar to previous exam.  IMPRESSION:  1.  No change in bilateral lower lobe opacities.  Original Report Authenticated By: Rosealee Albee, M.D.    Medications reviewed   ASSESSMENT AND PLAN  PULMONARY  ETT:  5/14>>>5/19 >>> 5/21 Trach (DF) 5/22>>  5/14 CT chest>> b/l effusions and basilar ATX/consolidation, severe bullous lung disease with emphysema in upper lobes b/l. 5/30 CXR>> Small pleural effusions/ atx 6/2 CXR>> large bulla left apex, unchanged bilat LL opac, no change.  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   Failed extubation, and s/p trach 5/22. Back to vent need 6/3   Plan: -declined, requires vent again -abg reviewed, reduce TV -pcxr reviewed, repeat in am for atx -no bronch planned, eval secretions -increase peep  To lower to goal 40%   CARDIOVASCULAR  left [place] CVL 5/14>>> 5/31 Rt femoral aline 5/14>>>out  Septic/hypovolemic shock 2nd to SBO complicated by wound dehisence>>resolved.   HTN, Edema, Intermittent tachycardia Plan: -continue lasix 40 to keep in negative fluid balance, same dose -re-added metoprolol 5/23>>monitor for bradycardia -prn hydralazine  RENAL  Hypokalemia, hypernatremia resolved. Replace k with lasix   GASTROINTESTINAL  5/09 CT abd/pelvis>>SBO, RLQ transition point, small Rt inguinal hernia, diverticulosis 5/14 CT abd/pelvis>>ileus ABD Wound Vac 5/16>>  SBO s/p lap with SB resection 5/10 complicated by post-op ileus and fascial dehiscence, and return to OR 5/16. Plan: -post-op care per [facility]- D/w Dr. Donell Beers - ok for swallow eval and po intake  -T f held for possible bronch, restart as not planned  HEMATOLOGIC  Anemia of critical illness.  Plan: -transfuse for Hb < 7 Wbc in am   INFECTIOUS  Cultures: BCX2 5/14 >>1/2 gram neg rod>>> 1/2 Klebsiella  UC 5/14 >>Enterococcus (sensitive to ampicillin) Sputum 5/14>> Klebsiella pneumoniae (pan sensitive) Wound 5/14>>Klebsiella Pneumoniae (pan sensitive) & Enterobacter Cloacae (resistant to ancef, cefoxitin) BC X2 5/20>>>ng Sputum 5/22>>>klebs, enterobacter  Antibiotics: vanc 5/14 >> 5/17 Zosyn 5/13 >>5/28  Wound infection, aspiration pneumonia, UTI. Plan: -monitor off abx  Pcxr, if fevers, add pseudo, mrsa coverage  ENDOCRINE CBG (last 3)   Basename 03/18/12 0025 03/17/12 1203  GLUCAP 91 116*   Hyperglycemia. Plan: -SSI  NEUROLOGIC  Acute metabolic encephalopathy 2nd to sepsis, respiratory failure, post-op pain, and ETOH.  Much improved.  Intolerant of lorazepam (does okay with  midazolam). Plan: -PRN fentanyl for pain  -limit benzo's and haldol -pt/ot when ok with [facility]  To chair  Depression / Anxiety Plan: -begin lexapro  5/31, hx of depression -was not on meds PTA (had stopped taking), ETOH abuse Daughter who is a  Child psychotherapist concerned about his depression   DISPO - has no LTAC benefits, plan for SNF/ rehab vs CIR  Mcarthur Rossetti. [name], MD, FACP Pgr: [phone] [facility]   Nelda Bucks. 03/19/12  @  10:03AM

## 2012-03-20 NOTE — Progress Notes (Signed)
Patient continues to have shortness of breath after breathing treatment. O2 sats 88% on trach collar 60% oxygen. Patient alert and oriented. Dr. Darrick Penna gave new orders.

## 2012-03-20 NOTE — Progress Notes (Signed)
Clinical Social Worker reviewed bed offers for pt at the SNF level of care.  At this time, pt had only one facility "considering". [facility])  Facility will not be able to offer a bed at the time of dc if pt is to dc with the wound vac.  CSW to continue to follow and assist as needed.   Angelia Mould, MSW, [place] [phone]

## 2012-03-20 NOTE — Progress Notes (Signed)
Patient complained of shortness of breath. O2 sats dropped to 85% on 28% trach collar. Patient alert and orient. Blood pressure 113/81, pulse 119. Tracheal suctioned and oxygen increased to 60%, also respiratory therapist notified. RN received small amount of thin clear secretions.

## 2012-03-20 NOTE — Progress Notes (Signed)
Clinical Social Worker reviewed case with MD.  CSW noted that pt placed back on vent at 60%.  CSW to continue to follow and assist as needed.   Angelia Mould, MSW, [employer] [phone]

## 2012-03-20 NOTE — Progress Notes (Signed)
18 Days Post-Op  Subjective: Alert and oriented. Passing lots of flatus. At lots of stool Saturday, but none yesterday. Afebrile. On trach collar.  Objective: Vital signs in last 24 hours: Temp:  [97.5 F (36.4 C)-98.6 F (37 C)] 98.6 F (37 C) (06/03 0400) Pulse Rate:  [98-115] 110  (06/03 0513) Resp:  [24-42] 31  (06/03 0513) BP: (112-135)/(72-91) 119/83 mmHg (06/03 0513) SpO2:  [88 %-100 %] 99 % (06/03 0513) FiO2 (%):  [28 %-80 %] 28 % (06/03 0513) Last BM Date: 03/18/12  Intake/Output from previous day: 06/02 0701 - 06/03 0700 In: 870 [I.V.:110; NG/GT:760] Out: 1225 [Urine:1225] Intake/Output this shift: Total I/O In: 460 [I.V.:100; NG/GT:360] Out: 950 [Urine:950]  General appearance: alert. Mental status normal. Trach collar in place. Localizes fairly well. GI: abdomen soft. Minimal discomfort, diffusely  active bowel sounds. Wound looks clean. VACin place.  Lab Results:  Results for orders placed during the hospital encounter of 02/24/12 (from the past 24 hour(s))  BLOOD GAS, ARTERIAL     Status: Abnormal   Collection Time   03/20/12  1:34 AM      Component Value Range   FIO2 0.60     Delivery systems TRACH COLLAR/TRACH TUBE     pH, Arterial 7.480 (*) 7.350 - 7.450    pCO2 arterial 34.6 (*) 35.0 - 45.0 (mmHg)   pO2, Arterial 48.9 (*) 80.0 - 100.0 (mmHg)   Bicarbonate 25.5 (*) 20.0 - 24.0 (mEq/L)   TCO2 26.5  0 - 100 (mmol/L)   Acid-Base Excess 2.2 (*) 0.0 - 2.0 (mmol/L)   O2 Saturation 84.4     Patient temperature 98.6     Collection site RIGHT RADIAL     Drawn by [id number]     Sample type ARTERIAL DRAW     Allens test (pass/fail) PASS  PASS      Studies/Results: @RISRSLT24 @     . albuterol  2.5 mg Nebulization QID  . antiseptic oral rinse  15 mL Mouth Rinse QID  . chlorhexidine  15 mL Mouth Rinse BID  . enoxaparin  40 mg Subcutaneous Q24H  . escitalopram  10 mg Oral Daily  . furosemide  40 mg Per Tube Daily  . furosemide  80 mg Intravenous Once  .  metoprolol  2.5 mg Intravenous Q8H  . pantoprazole (PROTONIX) IV  40 mg Intravenous QHS  . sodium chloride  3 mL Intravenous Q12H  . white petrolatum         Assessment/Plan: s/p Procedure(s): DEBRIDEMENT CLOSURE/ABDOMINAL WOUND  Stable from surgical viewpoint. Continue tube feeds and current wound care. No change in plans from surgical standpoint.    LOS: 25 days    Kushi Kun M. Derrell Lolling, M.D., [facility] General and Minimally invasive Surgery Breast and Colorectal Surgery Office:   [phone] Pager:   [phone]  03/20/2012  . .prob

## 2012-03-21 ENCOUNTER — Inpatient Hospital Stay (HOSPITAL_COMMUNITY): Payer: Medicare Other

## 2012-03-21 LAB — CBC
Hemoglobin: 11.3 g/dL — ABNORMAL LOW (ref 13.0–17.0)
MCH: 29 pg (ref 26.0–34.0)
MCHC: 32.8 g/dL (ref 30.0–36.0)
Platelets: 362 10*3/uL (ref 150–400)
RBC: 3.9 MIL/uL — ABNORMAL LOW (ref 4.22–5.81)

## 2012-03-21 LAB — BASIC METABOLIC PANEL
Calcium: 8.5 mg/dL (ref 8.4–10.5)
Creatinine, Ser: 0.84 mg/dL (ref 0.50–1.35)
GFR calc non Af Amer: 90 mL/min — ABNORMAL LOW (ref >90)
Sodium: 144 mEq/L (ref 135–145)

## 2012-03-21 LAB — MAGNESIUM: Magnesium: 2 mg/dL (ref 1.5–2.5)

## 2012-03-21 LAB — PHOSPHORUS: Phosphorus: 2.9 mg/dL (ref 2.3–4.6)

## 2012-03-21 MED ORDER — PRO-STAT SUGAR FREE PO LIQD
30.0000 mL | Freq: Four times a day (QID) | ORAL | Status: AC
  Administered 2012-03-23 (×3): 30 mL
  Filled 2012-03-21 (×11): qty 30

## 2012-03-21 MED ORDER — SODIUM CHLORIDE 0.45 % IV SOLN: INTRAVENOUS | Status: DC

## 2012-03-21 NOTE — Progress Notes (Addendum)
19 Days Post-Op  Subjective: Resting comfortably in bed on 40% trach collar, he appears to be in no acute distress at this time.  Objective: Vital signs in last 24 hours: Temp:  [98.3 F (36.8 C)-99.5 F (37.5 C)] 98.8 F (37.1 C) (06/04 0447) Pulse Rate:  [100-109] 100  (06/04 0447) Resp:  [24-30] 24  (06/04 0447) BP: (97-122)/(59-81) 122/79 mmHg (06/04 0447) SpO2:  [96 %-100 %] 99 % (06/04 0447) FiO2 (%):  [50 %] 50 % (06/04 0811) Weight:  [161 lb 6 oz (73.2 kg)] 161 lb 6 oz (73.2 kg) (06/04 0500) Last BM Date: 03/20/12 WBC is elevated slightly at 16,000 up from previous 14,000 Intake/Output from previous day: 06/03 0701 - 06/04 0700 In: 2203 [I.V.:1233; NG/GT:740; IV Piggyback:230] Out: 250 [Urine:225; Drains:25] Intake/Output this shift:    Incision/Wound:wound vac in place, a noticeable odor is present, per the nurse who change wound vac yesterday there was minimal purulent drainage, and that the wound edges appeared red. Wound vac is scheduled to be changed again in the am.  Lab Results:   Basename 03/20/12 1213 03/19/12 0425  WBC 16.6* 14.0*  HGB 12.4* 11.3*  HCT 36.8* 34.3*  PLT 365 338   BMET  Basename 03/21/12 0405 03/19/12 0425  NA 144 136  K 3.9 3.7  CL 110 104  CO2 25 23  GLUCOSE 107* 109*  BUN 25* 17  CREATININE 0.84 0.69  CALCIUM 8.5 8.4   PT/INR No results found for this basename: LABPROT:2,INR:2 in the last 72 hours ABG  Basename 03/20/12 0847 03/20/12 0134  PHART 7.494* 7.480*  HCO3 25.3* 25.5*    Studies/Results: Dg Chest Port 1 View  03/21/2012  *RADIOLOGY REPORT*  Clinical Data: Respiratory distress.  PORTABLE CHEST - 1 VIEW  Comparison: 03/20/2012.  Findings: The tracheostomy tube and Panda tube appears stable. Stable changes of bullous emphysema with superimposed bibasilar atelectasis or infiltrates.  IMPRESSION:  1.  Stable support apparatus. 2.  Persistent bibasilar infiltrates or atelectasis.  Original Report Authenticated By: P. Loralie Champagne, M.D.   Dg Chest Port 1 View  03/20/2012  *RADIOLOGY REPORT*  Clinical Data: Respiratory distress.  PORTABLE CHEST - 1 VIEW  Comparison: 03/19/2012.  Findings: The tracheostomy tube and Panda tubes are stable.  The cardiac silhouette, mediastinal and hilar contours are stable. Stable severe underlying emphysematous changes with apical bulla. Persistent bibasilar lung opacities, most likely atelectasis.  No definite pleural effusions or pneumothorax.  IMPRESSION: Stable chest x-ray findings.  Original Report Authenticated By: P. Loralie Champagne, M.D.    Anti-infectives: Anti-infectives     Start     Dose/Rate Route Frequency Ordered Stop   03/03/12 1800   vancomycin (VANCOCIN) 1,750 mg in sodium chloride 0.9 % 500 mL IVPB  Status:  Discontinued        1,750 mg 250 mL/hr over 120 Minutes Intravenous Every 12 hours 03/03/12 0628 03/03/12 0918   02/29/12 1900   micafungin (MYCAMINE) 100 mg in sodium chloride 0.9 % 100 mL IVPB  Status:  Discontinued        100 mg 100 mL/hr over 1 Hours Intravenous Every 24 hours 02/29/12 1754 03/01/12 0933   02/29/12 1730   vancomycin (VANCOCIN) 1,250 mg in sodium chloride 0.9 % 250 mL IVPB  Status:  Discontinued        1,250 mg 166.7 mL/hr over 90 Minutes Intravenous Every 12 hours 02/29/12 1644 03/03/12 0628   02/29/12 1715   vancomycin (VANCOCIN) IVPB 1000 mg/200 mL premix  Status:  Discontinued        1,000 mg 200 mL/hr over 60 Minutes Intravenous  Once 02/29/12 1637 02/29/12 1647   02/29/12 1645   piperacillin-tazobactam (ZOSYN) IVPB 3.375 g  Status:  Discontinued        3.375 g 100 mL/hr over 30 Minutes Intravenous  Once 02/29/12 1637 02/29/12 1641   02/28/12 1800   piperacillin-tazobactam (ZOSYN) IVPB 3.375 g  Status:  Discontinued        3.375 g 12.5 mL/hr over 240 Minutes Intravenous 4 times per day 02/28/12 1454 02/28/12 1510   02/28/12 1800   piperacillin-tazobactam (ZOSYN) IVPB 3.375 g  Status:  Discontinued        3.375 g 12.5  mL/hr over 240 Minutes Intravenous Every 8 hours 02/28/12 1510 03/14/12 1700   02/25/12 1109   dextrose 5 % with cefOXitin (MEFOXIN) ADS Med     Comments: HOLLIE, CHRISTINA: cabinet override         02/25/12 1109 02/25/12 2314   02/25/12 1100   cefOXitin (MEFOXIN) 1 g in dextrose 5 % 50 mL IVPB  Status:  Discontinued        1 g 100 mL/hr over 30 Minutes Intravenous 60 min pre-op 02/25/12 1100 02/25/12 1542          Assessment/Plan: s/p Procedure(s) (LRB): DEBRIDEMENT CLOSURE/ABDOMINAL WOUND (N/A) Plan:  1. Wound vac to continue 2. Recheck CBC in a.m.  3. Discuss above finding of odor with Dr. Derrell Lolling    LOS: 26 days    Blenda Mounts 03/21/2012  Surgery Attending:  Patient stable. Panda has pulled back and will be readvanced and xray taken (discussed with RN). Abdominal wound looks clean. Suspect odor is flexiseal drainage.  Will examine wound tomorrow to be sure.  No indication for antibiotics from surgical standpoint.   Angelia Mould. Derrell Lolling, M.D., [facility] General and Minimally invasive Surgery Breast and Colorectal Surgery Office:   [phone] Pager:   [phone]

## 2012-03-21 NOTE — Progress Notes (Signed)
Clinical Social Worker staffed case with MD.  CSW reviewed chart.  CSW phoned pt's dtr and updated her on current disposition.  Pt does not qualify for LTAC review until 03/25/12.  Pt currently does not have any SNF bed offers. Today, pt was at 28% trach collar (previously 50% on vent).  CSW re faxed pt to [facility].  CSW to continue to follow and assist as needed.   Angelia Mould, MSW, [place] [phone]

## 2012-03-21 NOTE — Progress Notes (Signed)
Physical Therapy Treatment Patient Details Name: Michael Ellison MRN: [id number] DOB: Sep 10, 1946 Today's Date: 03/21/2012 Time: 1191-4782 PT Time Calculation (min): 35 min  PT Assessment / Plan / Recommendation Comments on Treatment Session  Patient s/p SB resection with fascial dehiscence with VDRF and resultant trach.  Will continue to work on addressing patient's strength and endurance for activity.  Patient did well today and has not lost strength due to his being back on the vent the last few days.  Continue PT and progress as able.     Follow Up Recommendations  Skilled nursing facility;Supervision/Assistance - 24 hour    Barriers to Discharge  None      Equipment Recommendations  Defer to next venue    Recommendations for Other Services  None  Frequency Min 3X/week   Plan Discharge plan remains appropriate;Frequency remains appropriate    Precautions / Restrictions Precautions Precautions: Fall Precaution Comments: trach at 40% Restrictions Weight Bearing Restrictions: No   Pertinent Vitals/Pain VSS, No pain    Mobility  Bed Mobility Bed Mobility: Rolling Right;Right Sidelying to Sit;Sitting - Scoot to Edge of Bed Rolling Right: 5: Supervision Right Sidelying to Sit: 5: Supervision;With rails;HOB flat Supine to Sit: Not tested (comment) Sitting - Scoot to Edge of Bed: 5: Supervision Sit to Supine: Not Tested (comment) Details for Bed Mobility Assistance: cues for technique and hand placement Transfers Transfers: Sit to Stand;Stand to Sit Sit to Stand: 4: Min assist;With upper extremity assist;From bed Stand to Sit: 4: Min assist;With upper extremity assist;To chair/3-in-1;With armrests Stand Pivot Transfers: Not tested (comment) Details for Transfer Assistance: min verbal cues for hand placement.   Ambulation/Gait Ambulation/Gait Assistance: 4: Min assist (+ 1 for lines and followed with chair.) Ambulation Distance (Feet): 175 Feet Assistive device: Rolling  walker Ambulation/Gait Assistance Details: Patient with slight unsteadiness.  Several standing rest breaks.   Gait Pattern: Step-through pattern;Decreased step length - right;Decreased step length - left;Decreased stride length;Shuffle Gait velocity: Slow cadence Stairs: No Wheelchair Mobility Wheelchair Mobility: No    Exercises Total Joint Exercises Ankle Circles/Pumps: AROM;10 reps;Supine Heel Slides: AROM;10 reps;Supine    PT Goals Acute Rehab PT Goals PT Goal: Supine/Side to Sit - Progress: Progressing toward goal PT Goal: Sit at Edge Of Bed - Progress: Progressing toward goal PT Goal: Sit to Stand - Progress: Progressing toward goal PT Goal: Stand to Sit - Progress: Progressing toward goal PT Transfer Goal: Bed to Chair/Chair to Bed - Progress: Progressing toward goal PT Goal: Ambulate - Progress: Progressing toward goal  Visit Information  Last PT Received On: 03/21/12 Assistance Needed: +2    Subjective Data  Subjective: Patient with trach, no PMV, shakes head to yes no questions consistently   Cognition  Overall Cognitive Status: Appears within functional limits for tasks assessed/performed Difficult to assess due to: Tracheostomy Arousal/Alertness: Awake/alert Orientation Level: Appears intact for tasks assessed Behavior During Session: [facility] for tasks performed    Balance  Static Sitting Balance Static Sitting - Balance Support: No upper extremity supported;Feet supported Static Sitting - Level of Assistance: 6: Modified independent (Device/Increase time) Static Sitting - Comment/# of Minutes: 5 Static Standing Balance Static Standing - Balance Support: Bilateral upper extremity supported;During functional activity Static Standing - Level of Assistance: 4: Min assist  End of Session PT - End of Session Equipment Utilized During Treatment: Gait belt Activity Tolerance: Patient tolerated treatment well Patient left: in chair;with call bell/phone within  reach Nurse Communication: Mobility status    INGOLD,Caliya Narine 03/21/2012, 3:58 PM Emanii Bugbee  [place],PT Acute Rehabilitation [phone] (pager)

## 2012-03-21 NOTE — Progress Notes (Signed)
Name: Michael Ellison MRN: [id number] DOB: 05/08/1946    LOS: 26  PULMONARY / CRITICAL CARE MEDICINE  Pt Profile:   66 y/o male smoker admitted to [facility] 02/24/2012 with n/v and abdominal pain due to SBO due to adhesions.  Had ex lap with SB resection 5/10.  Developed delirium post-op.  Transferred to [facility] 5/14 for respiratory failure requiring intubation.  Developed abdominal fascial dehiscence and taken back to OR 5/16.  Failed extubation, and required tracheostomy 5/22. PMHx ETOH, HTN, Prostate cancer   Interval hx:  5/16: OR for fascial dehiscence repair- closed with wound vac 5/18- failed weaning 5/19- extubated 5/21- re-intubated for hypercarbia 5/22-improved co2 and neurostatus, pos almost 2 liters 5/22 SVT>>resolved spontaneously 5/30 -off vent for 24 hours, planned Panda placement 6/1> up in chair, improved. 6/3- desat, increased WOB, vent 6/3- leak trach, improved with extra long  Subjective: 6/3- better with extra long, off vent  Vital Signs: Temp:  [97.9 F (36.6 C)-99.5 F (37.5 C)] 97.9 F (36.6 C) (06/04 1221) Pulse Rate:  [100-109] 100  (06/04 0447) Resp:  [24-30] 24  (06/04 0447) BP: (97-122)/(59-81) 122/79 mmHg (06/04 0447) SpO2:  [96 %-100 %] 99 % (06/04 0447) FiO2 (%):  [28 %-50 %] 28 % (06/04 1221) Weight:  [73.2 kg (161 lb 6 oz)] 73.2 kg (161 lb 6 oz) (06/04 0500)  Physical Examination: General - pleasant male, in chair on TC  HEENT - trach site clean Cardiac - s1s2 regular, no murmur Chest - still some coarse BS Abd - midline open wound with wound vac in place, tender with mild palpation, +bs Ext - no edema Neuro - follows commands, alert   CBC  Lab 03/21/12 1223 03/20/12 1213 03/19/12 0425  HGB 11.3* 12.4* 11.3*  HCT 34.5* 36.8* 34.3*  WBC 13.0* 16.6* 14.0*  PLT 362 365 338    BMET  Lab 03/21/12 0405 03/19/12 0425 03/17/12 0518 03/16/12 0500 03/15/12 0500  NA 144 136 135 135 136  K 3.9 3.7 -- -- --  CL 110 104 105 102 105  CO2 25 23 24  24 24   GLUCOSE 107* 109* 105* 138* 100*  BUN 25* 17 18 21 23   CREATININE 0.84 0.69 0.59 0.63 0.69  CALCIUM 8.5 8.4 8.4 8.3* 8.4  MG 2.0 -- -- 1.8 --  PHOS 2.9 -- -- 3.0 --     Lab Results  Component Value Date   ALT 35 03/16/2012   AST 29 03/16/2012   ALKPHOS 104 03/16/2012   BILITOT 0.6 03/16/2012    Intake/Output Summary (Last 24 hours) at 03/21/12 1259 Last data filed at 03/21/12 0600  Gross per 24 hour  Intake   1943 ml  Output    225 ml  Net   1718 ml   Dg Chest Port 1 View  03/21/2012  *RADIOLOGY REPORT*  Clinical Data: Respiratory distress.  PORTABLE CHEST - 1 VIEW  Comparison: 03/20/2012.  Findings: The tracheostomy tube and Panda tube appears stable. Stable changes of bullous emphysema with superimposed bibasilar atelectasis or infiltrates.  IMPRESSION:  1.  Stable support apparatus. 2.  Persistent bibasilar infiltrates or atelectasis.  Original Report Authenticated By: P. Loralie Champagne, M.D.   Dg Chest Port 1 View  03/20/2012  *RADIOLOGY REPORT*  Clinical Data: Respiratory distress.  PORTABLE CHEST - 1 VIEW  Comparison: 03/19/2012.  Findings: The tracheostomy tube and Panda tubes are stable.  The cardiac silhouette, mediastinal and hilar contours are stable. Stable severe underlying emphysematous changes with apical bulla. Persistent  bibasilar lung opacities, most likely atelectasis.  No definite pleural effusions or pneumothorax.  IMPRESSION: Stable chest x-ray findings.  Original Report Authenticated By: P. Loralie Champagne, M.D.    Medications reviewed   ASSESSMENT AND PLAN  PULMONARY  ETT:  5/14>>>5/19 >>> 5/21 Trach (DF) 5/22>>  5/14 CT chest>> b/l effusions and basilar ATX/consolidation, severe bullous lung disease with emphysema in upper lobes b/l. 5/30 CXR>> Small pleural effusions/ atx 6/2 CXR>> large bulla left apex, unchanged bilat LL opac, no change.  Acute hypoxic/hypercapnic respiratory failure 2nd to delirium, aspiration pneumonia, probable COPD with  bullous emphysema with hx of smoking.  Failed extubation, and s/p trach 5/22. Back to vent need 6/3 Xtra long 6 cuffed 6/3>>>   Plan: -on trach collar now in a chair -goal 4-6 hrs, then back to vent rest -follow secretions and rt base atx on pcxr in am  -may still require bronch if fails again clinically and not progressing Better with extra long  CARDIOVASCULAR  left [place] CVL 5/14>>> 5/31 Rt femoral aline 5/14>>>out   HTN, Edema, Intermittent tachycardia Plan: To 1/2 NS Follow am needs lasix, hold off for now Chem in am   RENAL  Hypokalemia, hypernatremia resolved. May need lasix in am   GASTROINTESTINAL  5/09 CT abd/pelvis>>SBO, RLQ transition point, small Rt inguinal hernia, diverticulosis 5/14 CT abd/pelvis>>ileus ABD Wound Vac 5/16>>  SBO s/p lap with SB resection 5/10 complicated by post-op ileus and fascial dehiscence, and return to OR 5/16. Plan: -post-op care per CCS, appreciate wound care -restart T F  HEMATOLOGIC  Anemia of critical illness.  Plan: -transfuse for Hb < 7 Wbc in am   INFECTIOUS  Cultures: BCX2 5/14 >>1/2 gram neg rod>>> 1/2 Klebsiella  UC 5/14 >>Enterococcus (sensitive to ampicillin) Sputum 5/14>> Klebsiella pneumoniae (pan sensitive) Wound 5/14>>Klebsiella Pneumoniae (pan sensitive) & Enterobacter Cloacae (resistant to ancef, cefoxitin) BC X2 5/20>>>ng Sputum 5/22>>>klebs, enterobacter  Antibiotics: vanc 5/14 >> 5/17 Zosyn 5/13 >>5/28  Wound infection, aspiration pneumonia, UTI. Plan: -monitor off abx  Pcxr , re eval rt base atx  ENDOCRINE CBG (last 3)  No results found for this basename: GLUCAP:3 in the last 72 hours Hyperglycemia. Plan: -SSI  NEUROLOGIC  Acute metabolic encephalopathy 2nd to sepsis, respiratory failure, post-op pain, and ETOH.  Much improved.  Intolerant of lorazepam (does okay with midazolam). Plan: -PRN fentanyl for pain  -limit benzo's and haldol -pt/ot when ok with CCS  To chair successful  daily  Depression / Anxiety Plan: -begin lexapro  5/31, hx of depression -was not on meds PTA (had stopped taking), ETOH abuse Daughter who is a  Child psychotherapist concerned about his depression I will call daughter and update her today  DISPO - has no LTAC benefits, per social work and family  Mcarthur Rossetti. [name], MD, FACP Pgr: [phone] [facility]   Nelda Bucks. 03/19/12  @  10:03AM

## 2012-03-21 NOTE — Progress Notes (Addendum)
Nutrition Follow-up  BSE and FEES completed 5/30 - recommending NPO. SLP to re-evaluate swallow soon.  TPN discontinued 5/31 by MD.  Karie Soda tube placed 5/31 with radiology confirmation of Panda tube tip is in the duodenum at the ligament of Treitz. RN reports pt is having watery loose stools at this time. Flexiseal in place.  Discussed need for increase of TF rate with CCM NP.  Diet Order:  NPO TF: Osmolite 1.2 at 10 ml/h, increase by 10 ml every 8 hours to goal rate of 40 ml/h. This provides: 1152 kcal, 54 grams protein, 787 ml free water. This is meeting 61% of kcal needs and 51% of protein needs.  Meds: Scheduled Meds:   . albuterol  2.5 mg Nebulization QID  . antiseptic oral rinse  15 mL Mouth Rinse QID  . chlorhexidine  15 mL Mouth Rinse BID  . enoxaparin  40 mg Subcutaneous Q24H  . escitalopram  10 mg Oral Daily  . furosemide  40 mg Per Tube Daily  . metoprolol  2.5 mg Intravenous Q8H  . pantoprazole (PROTONIX) IV  40 mg Intravenous QHS  . potassium chloride  10 mEq Intravenous Q1 Hr x 2  . sodium chloride  3 mL Intravenous Q12H   Continuous Infusions:   . sodium chloride 10 mL/hr (03/19/12 1447)  . feeding supplement (OSMOLITE 1.2 CAL) 1,000 mL (03/21/12 0129)   PRN Meds:.sodium chloride, albuterol, bisacodyl, fentaNYL, hydrALAZINE, phenol  Labs:  CMP     Component Value Date/Time   NA 144 03/21/2012 0405   K 3.9 03/21/2012 0405   CL 110 03/21/2012 0405   CO2 25 03/21/2012 0405   GLUCOSE 107* 03/21/2012 0405   BUN 25* 03/21/2012 0405   CREATININE 0.84 03/21/2012 0405   CALCIUM 8.5 03/21/2012 0405   PROT 6.9 03/16/2012 0500   ALBUMIN 1.8* 03/16/2012 0500   AST 29 03/16/2012 0500   ALT 35 03/16/2012 0500   ALKPHOS 104 03/16/2012 0500   BILITOT 0.6 03/16/2012 0500   GFRNONAA 90* 03/21/2012 0405   GFRAA >90 03/21/2012 0405   Lipid Panel     Component Value Date/Time   CHOL 82 03/13/2012 0500   TRIG 100 03/14/2012 0435    Intake/Output Summary (Last 24 hours) at 03/21/12 1150 Last  data filed at 03/21/12 0600  Gross per 24 hour  Intake   2163 ml  Output    225 ml  Net   1938 ml    Weight Status:  161 lb/73.2 kg, weight between 160 - 165 lb x approximately 2 weeks  Body mass index is 20.72 kg/(m^2). WNL.  Re-estimated needs:  1900-2000 kcals, 105-120 grams protein daily  Nutrition Dx:  Inadequate oral intake - ongoing.  Goal:  Intake to meet 90-100% of estimated nutrition needs, met.  Intervention:   1. To better meet nutrition needs, recommend increasing TF to Osmolite 1.2 at 60 ml/hr with 30 ml Prostat BID to provide 1928 kcals, 110 grams protein, 1181 ml free water daily.  2. To better meet nutrition needs until rate is increased, will add 30 ml Prostat via tube QID. This will provide an additional 400 kcal and 60 grams protein. This protein supplementation with Osmolite 1.2 at 40 ml/hr will provide: 1552 kcal and 114 grams protein. Meeting 82% of kcal needs and 100% protein needs. 3. RD to continue to follow nutrition care plan  Monitor:  Weights, labs, PO intake, I/O's  Adair Laundry Pager #:  [phone]

## 2012-03-21 NOTE — Progress Notes (Signed)
Pt noted to have large mucusy bloody stool and Dr. Craige Cotta made aware.

## 2012-03-21 NOTE — Progress Notes (Addendum)
Ok to have panda reinserted by radiology per Dr. Delford Field

## 2012-03-22 ENCOUNTER — Inpatient Hospital Stay (HOSPITAL_COMMUNITY): Payer: Medicare Other

## 2012-03-22 LAB — COMPREHENSIVE METABOLIC PANEL
ALT: 33 U/L (ref 0–53)
AST: 18 U/L (ref 0–37)
Alkaline Phosphatase: 84 U/L (ref 39–117)
CO2: 25 mEq/L (ref 19–32)
Calcium: 8.9 mg/dL (ref 8.4–10.5)
Potassium: 3.6 mEq/L (ref 3.5–5.1)
Sodium: 145 mEq/L (ref 135–145)
Total Protein: 7.5 g/dL (ref 6.0–8.3)

## 2012-03-22 MED ORDER — OSMOLITE 1.2 CAL PO LIQD
1000.0000 mL | ORAL | Status: DC
  Administered 2012-03-23 – 2012-03-29 (×6): 1000 mL
  Filled 2012-03-22 (×15): qty 1000

## 2012-03-22 MED ORDER — FUROSEMIDE 10 MG/ML IJ SOLN
40.0000 mg | Freq: Once | INTRAMUSCULAR | Status: AC
  Administered 2012-03-22: 40 mg via INTRAVENOUS
  Filled 2012-03-22: qty 4

## 2012-03-22 NOTE — Progress Notes (Signed)
I interviewed and examined this patient today. I agree with the notes by Mr. Golda Acre, NP.    I removed his VAC dressing and examined the wound. The fascia is intact. There is no purulence or enteric drainage. The retention sutures were tightened a little bit. The VAC was replaced.  Abdomen soft.  He is going to radiology for placement of a new Panda tube under fluoroscopic guidance so he can resume his tube feedings.  Will follow.   Angelia Mould. Derrell Lolling, M.D., [facility] General and Minimally invasive Surgery Breast and Colorectal Surgery Office:   [phone] Pager:   [phone]

## 2012-03-22 NOTE — Progress Notes (Signed)
20 Days Post-Op  Subjective: Resting quietly in bed, he is awake ,alert and orientated to time and place.He appears to be in no acute distress at this time.  Complains of abdominal pain only with coughing.   Objective:  Vital signs in last 24 hours: Temp:  [98.5 F (36.9 C)-98.8 F (37.1 C)] 98.5 F (36.9 C) (06/05 1145) Pulse Rate:  [94-108] 100  (06/05 0800) Resp:  [17-29] 29  (06/05 0800) BP: (106-133)/(76-89) 133/89 mmHg (06/05 0800) SpO2:  [95 %-100 %] 96 % (06/05 1145) FiO2 (%):  [40 %-50 %] 40 % (06/05 1200) Weight:  [151 lb 3.8 oz (68.6 kg)] 151 lb 3.8 oz (68.6 kg) (06/05 0447) Last BM Date: 03/21/12  Intake/Output from previous day: 06/04 0701 - 06/05 0700 In: 120 [I.V.:120] Out: 650 [Urine:350; Stool:300] Intake/Output this shift: Total I/O In: 40 [I.V.:40] Out: -   General appearance: alert, cooperative and no distress. On TC @ 40% with passey-muir valve.  Abdomial wound: Wound vac was changed today, wound edges are clean, fascia is intact, no exudate noted.   Lab Results:   Basename 03/21/12 1223 03/20/12 1213  WBC 13.0* 16.6*  HGB 11.3* 12.4*  HCT 34.5* 36.8*  PLT 362 365   BMET  Basename 03/22/12 0526 03/21/12 0405  NA 145 144  K 3.6 3.9  CL 111 110  CO2 25 25  GLUCOSE 83 107*  BUN 24* 25*  CREATININE 0.86 0.84  CALCIUM 8.9 8.5   PT/INR No results found for this basename: LABPROT:2,INR:2 in the last 72 hours ABG  Basename 03/20/12 0847 03/20/12 0134  PHART 7.494* 7.480*  HCO3 25.3* 25.5*    Studies/Results: Dg Chest Port 1 View  03/22/2012  Michael Ellison, Michael Ellison        DOB: June 03, 1946    Age at exam: 58           MRN: [id number]                         Y   Sex: M                                                              ACC: [id number]            Exam: DG CHEST 1 VIEW                        Org: Conv                                                                        03/22/2012 5:58 AM       Ex. Sts: C     Report Status: Pending        Perf.  Resource: [id number]                         Creation                                                                *  RADIOLOGY REPORT*  Clinical Data: Respiratory distress.  Chest one-view  Comparison:  03/21/2012.  Findings: The tracheostomy tube is stable.  The cardiac silhouette, mediastinal and hilar contours are unchanged.  Persistent changes of bullous emphysema and persistent bibasilar infiltrates.  IMPRESSION: 1.  Stable tracheostomy tube. 2.  Persistent bibasilar infiltrates.  Original Report Authenticated By: P. Loralie Champagne, M.D.   Dg Chest Port 1 View  03/21/2012  *RADIOLOGY REPORT*  Clinical Data: Respiratory distress.  PORTABLE CHEST - 1 VIEW  Comparison: 03/20/2012.  Findings: The tracheostomy tube and Panda tube appears stable. Stable changes of bullous emphysema with superimposed bibasilar atelectasis or infiltrates.  IMPRESSION:  1.  Stable support apparatus. 2.  Persistent bibasilar infiltrates or atelectasis.  Original Report Authenticated By: P. Loralie Champagne, M.D.    Anti-infectives: Anti-infectives     Start     Dose/Rate Route Frequency Ordered Stop   03/03/12 1800   vancomycin (VANCOCIN) 1,750 mg in sodium chloride 0.9 % 500 mL IVPB  Status:  Discontinued        1,750 mg 250 mL/hr over 120 Minutes Intravenous Every 12 hours 03/03/12 0628 03/03/12 0918   02/29/12 1900   micafungin (MYCAMINE) 100 mg in sodium chloride 0.9 % 100 mL IVPB  Status:  Discontinued        100 mg 100 mL/hr over 1 Hours Intravenous Every 24 hours 02/29/12 1754 03/01/12 0933   02/29/12 1730   vancomycin (VANCOCIN) 1,250 mg in sodium chloride 0.9 % 250 mL IVPB  Status:  Discontinued        1,250 mg 166.7 mL/hr over 90 Minutes Intravenous Every 12 hours 02/29/12 1644 03/03/12 0628   02/29/12 1715   vancomycin (VANCOCIN) IVPB 1000 mg/200 mL premix  Status:  Discontinued        1,000 mg 200 mL/hr over 60 Minutes Intravenous  Once 02/29/12 1637 02/29/12 1647   02/29/12 1645    piperacillin-tazobactam (ZOSYN) IVPB 3.375 g  Status:  Discontinued        3.375 g 100 mL/hr over 30 Minutes Intravenous  Once 02/29/12 1637 02/29/12 1641   02/28/12 1800   piperacillin-tazobactam (ZOSYN) IVPB 3.375 g  Status:  Discontinued        3.375 g 12.5 mL/hr over 240 Minutes Intravenous 4 times per day 02/28/12 1454 02/28/12 1510   02/28/12 1800   piperacillin-tazobactam (ZOSYN) IVPB 3.375 g  Status:  Discontinued        3.375 g 12.5 mL/hr over 240 Minutes Intravenous Every 8 hours 02/28/12 1510 03/14/12 1700   02/25/12 1109   dextrose 5 % with cefOXitin (MEFOXIN) ADS Med     Comments: Michael Ellison, Michael Ellison: cabinet override         02/25/12 1109 02/25/12 2314   02/25/12 1100   cefOXitin (MEFOXIN) 1 g in dextrose 5 % 50 mL IVPB  Status:  Discontinued        1 g 100 mL/hr over 30 Minutes Intravenous 60 min pre-op 02/25/12 1100 02/25/12 1542          Assessment/Plan: s/p Procedure(s) (LRB): DEBRIDEMENT CLOSURE/ABDOMINAL WOUND (N/A) Plan: Continue with wound vac.  LOS: 27 days    Michael Ellison 03/22/2012

## 2012-03-22 NOTE — Progress Notes (Signed)
Speech Language Pathology Dysphagia/PMSV Treatment Patient Details Name: Michael Ellison MRN: [id number] DOB: [birth year] Today's Date: 03/22/2012 Time: 1010-1045 SLP Time Calculation (min): 35 min  Assessment / Plan / Recommendation Clinical Impression  Pt demonstrates mild improvement in laryngeal elevation with effortful swallows of ice chips and puree. Intermiettent wet vocal quality concerning for penetration/aspriation of residual snd significant secretions. Will repeat FEES today per pt and MD wishes given accidental removal of Panda. Pt with PMSV in place during session. At baseline pt continues to be intermittently tachypneic. With valve in place O2 sats say in mid 90s with RR rate ranging from mid 30s to mid twenties with no signs of distress, no CO2 trapping. Pt is better able to orally expecotrate secretions with valve in place. Encouraged to wear valve during waking hours with intermittent staff supervision.     Diet Recommendation  Continue with Current Diet: NPO    SLP Plan FEES   Pertinent Vitals/Pain NA   Swallowing Goals  SLP Swallowing Goals Goal #3: Pt will complete pharyngeal strengthening exercises with 10 reps each with min verbal cues.  Swallow Study Goal #3 - Progress: Progressing toward goal  General Temperature Spikes Noted: No Respiratory Status: Trach Behavior/Cognition: Alert;Cooperative Oral Cavity - Dentition: Adequate natural dentition Patient Positioning: Upright in bed  Oral Cavity - Oral Hygiene Does patient have any of the following "at risk" factors?: Oxygen therapy - cannula, mask, simple oxygen devices Patient is HIGH RISK - Oral Care Protocol followed (see row info): Yes   Dysphagia Treatment Treatment focused on: Upgraded PO texture trials;Facilitation of pharyngeal phase Treatment Methods/Modalities: Effortful swallow;Skilled observation Patient observed directly with PO's: Yes Type of PO's observed: Ice chips;Dysphagia 1 (puree) Feeding:  Able to feed self Liquids provided via: Teaspoon Pharyngeal Phase Signs & Symptoms: Multiple swallows;Wet vocal quality;Delayed cough;Delayed throat clear;Other (comment) (Cough with expectoration of puree residue) Type of cueing: Verbal;Tactile Amount of cueing: Moderate   Michael Ellison, Michael Ellison 03/22/2012, 11:10 AM

## 2012-03-22 NOTE — Progress Notes (Signed)
Name: Michael Ellison MRN: [id number] DOB: Feb 07, 1946    LOS: 27  PULMONARY / CRITICAL CARE MEDICINE  Pt Profile:   66 y/o male smoker admitted to [facility] 02/24/2012 with n/v and abdominal pain due to SBO due to adhesions.  Had ex lap with SB resection 5/10.  Developed delirium post-op.  Transferred to [facility] 5/14 for respiratory failure requiring intubation.  Developed abdominal fascial dehiscence and taken back to OR 5/16.  Failed extubation, and required tracheostomy 5/22. PMHx ETOH, HTN, Prostate cancer   Interval hx:  5/16: OR for fascial dehiscence repair- closed with wound vac 5/18- failed weaning 5/19- extubated 5/21- re-intubated for hypercarbia 5/22-improved co2 and neurostatus, pos almost 2 liters 5/22 SVT>>resolved spontaneously 5/30 -off vent for 24 hours, planned Panda placement 6/1> up in chair, improved. 6/3- desat, increased WOB, vent 6/3- leak trach, improved with extra long  Subjective: Panda pulled out overnight.  No c/o.  On vent overnight.   Vital Signs: Temp:  [97.9 F (36.6 C)-98.8 F (37.1 C)] 98.5 F (36.9 C) (06/05 0800) Pulse Rate:  [94-124] 100  (06/05 0800) Resp:  [17-32] 29  (06/05 0800) BP: (106-133)/(72-89) 133/89 mmHg (06/05 0800) SpO2:  [95 %-100 %] 95 % (06/05 0800) FiO2 (%):  [28 %-50 %] 40 % (06/05 0800) Weight:  [151 lb 3.8 oz (68.6 kg)] 151 lb 3.8 oz (68.6 kg) (06/05 0447)  Physical Examination: General - pleasant male, NAD in bed  HEENT - trach site clean Cardiac - s1s2 regular, no murmur Chest - resps even non labored on ATC, still some coarse BS Abd - midline open wound with wound vac in place, tender with mild palpation, +bs Ext - no edema Neuro - follows commands, alert   CBC  Lab 03/21/12 1223 03/20/12 1213 03/19/12 0425  HGB 11.3* 12.4* 11.3*  HCT 34.5* 36.8* 34.3*  WBC 13.0* 16.6* 14.0*  PLT 362 365 338    BMET  Lab 03/22/12 0526 03/21/12 0405 03/19/12 0425 03/17/12 0518 03/16/12 0500  NA 145 144 136 135 135  K 3.6  3.9 -- -- --  CL 111 110 104 105 102  CO2 25 25 23 24 24   GLUCOSE 83 107* 109* 105* 138*  BUN 24* 25* 17 18 21   CREATININE 0.86 0.84 0.69 0.59 0.63  CALCIUM 8.9 8.5 8.4 8.4 8.3*  MG -- 2.0 -- -- 1.8  PHOS -- 2.9 -- -- 3.0     Lab Results  Component Value Date   ALT 33 03/22/2012   AST 18 03/22/2012   ALKPHOS 84 03/22/2012   BILITOT 0.5 03/22/2012    Intake/Output Summary (Last 24 hours) at 03/22/12 0940 Last data filed at 03/22/12 0800  Gross per 24 hour  Intake    130 ml  Output    650 ml  Net   -520 ml   Dg Chest [place] 1 View  03/22/2012  Michael Ellison, Michael Ellison        DOB: 08-26-1946    Age at exam: 66           MRN: [id number]                         Y   Sex: M  ACC: [id number]            Exam: DG CHEST 1 VIEW                        Org: Conv                                                                        03/22/2012 5:58 AM       Ex. Sts: C     Report Status: Pending        Perf. Resource: [id number]                         Creation                                                                *RADIOLOGY REPORT*  Clinical Data: Respiratory distress.  Chest one-view  Comparison:  03/21/2012.  Findings: The tracheostomy tube is stable.  The cardiac silhouette, mediastinal and hilar contours are unchanged.  Persistent changes of bullous emphysema and persistent bibasilar infiltrates.  IMPRESSION: 1.  Stable tracheostomy tube. 2.  Persistent bibasilar infiltrates.  Original Report Authenticated By: P. Loralie Champagne, M.D.   Dg Chest Port 1 View  03/21/2012  *RADIOLOGY REPORT*  Clinical Data: Respiratory distress.  PORTABLE CHEST - 1 VIEW  Comparison: 03/20/2012.  Findings: The tracheostomy tube and Panda tube appears stable. Stable changes of bullous emphysema with superimposed bibasilar atelectasis or infiltrates.  IMPRESSION:  1.  Stable support apparatus. 2.  Persistent bibasilar infiltrates or atelectasis.  Original Report  Authenticated By: P. Loralie Champagne, M.D.    Medications reviewed   ASSESSMENT AND PLAN  PULMONARY  ETT:  5/14>>>5/19 >>> 5/21 Trach (DF) 5/22>>  5/14 CT chest>> b/l effusions and basilar ATX/consolidation, severe bullous lung disease with emphysema in upper lobes b/l. 5/30 CXR>> Small pleural effusions/ atx 6/2 CXR>> large bulla left apex, unchanged bilat LL opac, no change.  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  Failed extubation, and s/p trach 5/22. Xtra long 6 cuffed 6/3>>>   Plan: -Cont ATC daytime with qhs and PRN rest for now , likely can try 24 hrs thur 6/6 -follow secretions and rt base atx on pcxr in am  -may still require bronch if fails again clinically and not progressing -lasix x1 6/5  CARDIOVASCULAR  left [place] CVL 5/14>>> 5/31 Rt femoral aline 5/14>>>out   HTN, Edema, Intermittent tachycardia Plan: To 1/2 NS Follow am needs lasix, hold off for now Chem in am   RENAL  Hypokalemia, hypernatremia resolved.   GASTROINTESTINAL  5/09 CT abd/pelvis>>SBO, RLQ transition point, small Rt inguinal hernia, diverticulosis 5/14 CT abd/pelvis>>ileus ABD Wound Vac 5/16>>  SBO s/p lap with SB resection 5/10 complicated by post-op ileus and fascial dehiscence, and return to OR 5/16. Plan: -post-op care per CCS, appreciate wound care - panda out - will have ST retry swallow eval  prior to IR replace panda   HEMATOLOGIC  Anemia of critical illness.  Plan: -transfuse for Hb < 7 -Wbc in am   INFECTIOUS  Cultures: BCX2 5/14 >>1/2 gram neg rod>>> 1/2 Klebsiella  UC 5/14 >>Enterococcus (sensitive to ampicillin) Sputum 5/14>> Klebsiella pneumoniae (pan sensitive) Wound 5/14>>Klebsiella Pneumoniae (pan sensitive) & Enterobacter Cloacae (resistant to ancef, cefoxitin) BC X2 5/20>>>ng Sputum 5/22>>>klebs, enterobacter  Antibiotics: vanc 5/14 >> 5/17 Zosyn  5/13 >>5/28  Wound infection, aspiration pneumonia, UTI. Plan: -monitor off abx  -cxr in am   ENDOCRINE CBG (last 3)  Hyperglycemia. Plan: -SSI  NEUROLOGIC  Acute metabolic encephalopathy 2nd to sepsis, respiratory failure, post-op pain, and ETOH.  Much improved.  Intolerant of lorazepam (does okay with midazolam). Plan: -PRN fentanyl for pain  -limit benzo's and haldol -pt/ot when ok with CCS  -To chair daily  Depression / Anxiety Plan: -begin lexapro  5/31, hx of depression -was not on meds PTA (had stopped taking), ETOH abuse   DISPO - has no LTAC benefits, per social work and family    Danford Bad, NP 03/22/2012  9:52 AM Pager: [phone]  *Care during the described time interval was provided by me and/or other providers on the critical care team. I have reviewed this patient's available data, including medical history, events of note, physical examination and test results as part of my evaluation.  I have fully examined this patient and agree with above findings.    And edited in full  Mcarthur Rossetti. [name], MD, FACP Pgr: [phone] [facility]

## 2012-03-22 NOTE — Progress Notes (Signed)
UR Completed.  Michael Ellison Jane [phone] 03/22/2012  

## 2012-03-22 NOTE — Procedures (Signed)
Objective Swallowing Evaluation: Fiberoptic Endoscopic Evaluation of Swallowing  Patient Details  Name: Michael Ellison MRN: [id number] Date of Birth: 1946-09-28  Today's Date: 03/22/2012 Time: 4098-1191 SLP Time Calculation (min): 28 min  Past Medical History:  Past Medical History  Diagnosis Date  . Hypertension   . Cancer   . Cancer of prostate    Past Surgical History:  Past Surgical History  Procedure Date  . Hernia repair   . Prostate biopsy   . Robotic prostate surgery 2011  . Laparotomy 02/25/2012    Procedure: EXPLORATORY LAPAROTOMY;  Surgeon: [name], MD;  Location: [facility];  Service: General;  Laterality: N/A;  . Bowel resection 02/25/2012    Procedure: SMALL BOWEL RESECTION;  Surgeon: [name], MD;  Location: [facility];  Service: General;;  . Wound debridement 03/02/2012    Procedure: DEBRIDEMENT CLOSURE/ABDOMINAL WOUND;  Surgeon: Cherylynn Ridges, MD;  Location: [facility] OR;  Service: General;  Laterality: N/A;   HPI:  66 yo male smoker admitted to [facility] 02/24/2012 with n/v and abdominal pain due to SBO due to adhesions.  Had ex lap with SB resection 5/10.  Developed delirium post-op.  Transferred to [facility] 5/14 for respiratory failure requiring intubation.  Developed fascial dehiscence and taken back to OR 5/16.  Failed extubation 5/19, reintubated and required tracheostomy 5/22.  Has been tolerating trach collar with PMSV with full supervision. Had large bore NG pulled today prior to assessment.      Assessment / Plan / Recommendation Clinical Impression  Dysphagia Diagnosis: Suspected primary esophageal dysphagia;Moderate pharyngeal phase dysphagia Clinical impression: Repeat FEES revealed minimal improvement in overall swallowing function from previous exam. Patient continues to present with laryngeal and pharyngeal weakness resulting in moderately sluggish swallow response however question if nature of dysphagia is primarily esophageal/structural based on consistent  backflow of bolus noted post swallow from esophagus with all consistencies (also noted on previous exam however perhaps not to this degree).  This backflow, when combined with mild-moderate initial residuals results in trace but silent aspiration of bolus and generally poor ability to maintain a clear airway. Additionally, residuals continue to mix with secretions as study progessed, further increasing episodes of penetration/aspiration.  Patient to remain NPO except ice chips after oral care at this time as aspiration risk severe. Recommend f/u at bedside with repeat objective exam, MBS to more fully assess esophageal function, with clinical bedside improvement in ability to manage secretions and tolerate ice chips. If structural component to dysphagia exists, suspect patient may have been previously compensating but poor functional reserve does not allow patient to do so at this time.      Treatment Recommendation  Therapy as outlined in treatment plan below    Diet Recommendation NPO;Ice chips PRN after oral care   Medication Administration: Via alternative means    Other  Recommendations Recommended Consults: MBS Oral Care Recommendations: Oral care QID   Follow Up Recommendations  Inpatient Rehab    Frequency and Duration min 2x/week  2 weeks   Pertinent Vitals/Pain n/a    SLP Swallow Goals Goal #3: Pt will complete pharyngeal strengthening exercises with 10 reps each with min verbal cues.  Swallow Study Goal #3 - Progress: Progressing toward goal   General HPI: 66 yo male smoker admitted to [facility] 02/24/2012 with n/v and abdominal pain due to SBO due to adhesions.  Had ex lap with SB resection 5/10.  Developed delirium post-op.  Transferred to [facility] 5/14 for respiratory failure requiring intubation.  Developed fascial dehiscence and taken back to OR 5/16.  Failed extubation 5/19, reintubated and required tracheostomy 5/22.  Has been tolerating trach collar with PMSV with full supervision. Had  large bore NG pulled today prior to assessment.  Type of Study: Fiberoptic Endoscopic Evaluation of Swallowing Reason for Referral: Objectively evaluate swallowing function Previous Swallow Assessment: FEES 03/16/12, recommended NPO Diet Prior to this Study: NPO (except ice chips after oral care) Temperature Spikes Noted: No Respiratory Status: Trach (trach collar) Trach Size and Type: Cuff;Deflated;#8;With PMSV in place History of Recent Intubation: Yes Length of Intubations (days): 9 days Date extubated: 03/08/12 Behavior/Cognition: Alert;Cooperative Oral Cavity - Dentition: Adequate natural dentition Oral Motor / Sensory Function: Within functional limits Self-Feeding Abilities: Able to feed self Vision: Functional for self-feeding Patient Positioning: Upright in bed Baseline Vocal Quality: Wet Volitional Cough: Strong Volitional Swallow: Able to elicit Anatomy: Other (Comment) (what appears to be anterior curvature of cervical spine) Pharyngeal Secretions: Standing secretions in (comment) (pyriform sinuses)    Reason for Referral Objectively evaluate swallowing function   Ferdinand Lango MA, CCC-SLP [phone]           Michael Ellison 03/22/2012, 2:57 PM

## 2012-03-22 NOTE — Progress Notes (Signed)
Patient had a small BM around 1600 today (03/22/12) that had a bright red and black color with a mucousy consistency.

## 2012-03-23 ENCOUNTER — Inpatient Hospital Stay (HOSPITAL_COMMUNITY): Payer: Medicare Other

## 2012-03-23 LAB — BASIC METABOLIC PANEL
BUN: 31 mg/dL — ABNORMAL HIGH (ref 6–23)
Calcium: 9.2 mg/dL (ref 8.4–10.5)
Creatinine, Ser: 1.01 mg/dL (ref 0.50–1.35)
GFR calc non Af Amer: 76 mL/min — ABNORMAL LOW (ref >90)
Glucose, Bld: 66 mg/dL — ABNORMAL LOW (ref 70–99)

## 2012-03-23 LAB — CBC
HCT: 38.9 % — ABNORMAL LOW (ref 39.0–52.0)
Hemoglobin: 12.6 g/dL — ABNORMAL LOW (ref 13.0–17.0)
MCH: 28.9 pg (ref 26.0–34.0)
MCHC: 32.4 g/dL (ref 30.0–36.0)
MCV: 89.2 fL (ref 78.0–100.0)
RDW: 14.3 % (ref 11.5–15.5)

## 2012-03-23 MED ORDER — BUDESONIDE 0.5 MG/2ML IN SUSP
0.5000 mg | Freq: Two times a day (BID) | RESPIRATORY_TRACT | Status: DC
  Administered 2012-03-23 – 2012-04-25 (×65): 0.5 mg via RESPIRATORY_TRACT
  Filled 2012-03-23 (×77): qty 2

## 2012-03-23 MED ORDER — DEXTROSE-NACL 5-0.45 % IV SOLN
INTRAVENOUS | Status: DC
  Administered 2012-03-29: 500 mL via INTRAVENOUS
  Administered 2012-04-01: 05:00:00 via INTRAVENOUS

## 2012-03-23 MED ORDER — DEXTROSE-NACL 5-0.45 % IV SOLN
INTRAVENOUS | Status: DC
  Administered 2012-03-23: 1000 mL via INTRAVENOUS

## 2012-03-23 MED ORDER — FREE WATER
200.0000 mL | Freq: Four times a day (QID) | Status: DC
  Administered 2012-03-23 – 2012-03-24 (×3): 200 mL

## 2012-03-23 MED ORDER — IOHEXOL 300 MG/ML  SOLN
50.0000 mL | Freq: Once | INTRAMUSCULAR | Status: AC | PRN
  Administered 2012-03-23: 20 mL

## 2012-03-23 MED ORDER — POTASSIUM CHLORIDE 20 MEQ/15ML (10%) PO LIQD
40.0000 meq | Freq: Once | ORAL | Status: AC
  Administered 2012-03-23: 40 meq
  Filled 2012-03-23: qty 30

## 2012-03-23 NOTE — Progress Notes (Signed)
Speech Language Pathology Dysphagia Treatment Patient Details Name: Michael Ellison MRN: [id number] DOB: 1946-06-30 Today's Date: 03/23/2012 Time: 1530-1600 SLP Time Calculation (min): 30 min  Assessment / Plan / Recommendation Clinical Impression  Pt again showing mild improvement in laryngeal elevation with pharyngeal exercises. Pt alloed to consume a few ice chips following oral care utilizing effortful swallows. Pt/RN aware, instruction posted. SLP will f/u for clinical improvement in swallow function.     Diet Recommendation  Continue with Current Diet: NPO    SLP Plan     Pertinent Vitals/Pain NA   Swallowing Goals  SLP Swallowing Goals Goal #3: Pt will complete pharyngeal strengthening exercises with 10 reps each with min verbal cues.  Swallow Study Goal #3 - Progress: Progressing toward goal  General Temperature Spikes Noted: No Respiratory Status: Trach Behavior/Cognition: Alert;Cooperative Oral Cavity - Dentition: Adequate natural dentition Patient Positioning: Upright in bed  Oral Cavity - Oral Hygiene     Dysphagia Treatment Treatment focused on: Facilitation of pharyngeal phase Treatment Methods/Modalities: Effortful swallow;Mendelsohn Maneuver;Masako Maneuver;Skilled observation Patient observed directly with PO's: Yes Type of PO's observed: Ice chips Feeding: Able to feed self Liquids provided via: Teaspoon Pharyngeal Phase Signs & Symptoms: Wet vocal quality;Multiple swallows;Delayed cough Type of cueing: Verbal;Tactile Amount of cueing: Moderate   Zhuri Krass, [name] 03/23/2012, 4:10 PM

## 2012-03-23 NOTE — Progress Notes (Signed)
Name: Michael Ellison MRN: [id number] DOB: 1946/01/30    LOS: 28  PULMONARY / CRITICAL CARE MEDICINE  Pt Profile:   66 y/o male smoker admitted to [facility] 02/24/2012 with n/v and abdominal pain due to SBO due to adhesions.  Had ex lap with SB resection 5/10.  Developed delirium post-op.  Transferred to [facility] 5/14 for respiratory failure requiring intubation.  Developed abdominal fascial dehiscence and taken back to OR 5/16.  Failed extubation, and required tracheostomy 5/22. PMHx ETOH, HTN, Prostate cancer   Interval hx:  5/16: OR for fascial dehiscence repair- closed with wound vac 5/18- failed weaning 5/19- extubated 5/21- re-intubated for hypercarbia 5/22-improved co2 and neurostatus, pos almost 2 liters 5/22 SVT>>resolved spontaneously 5/30 -off vent for 24 hours, planned Panda placement 6/1> up in chair, improved. 6/3- desat, increased WOB, vent 6/3- leak trach, improved with extra long 6/5: FEES eval: failed, still showing evidence of aspiration.  Subjective: Frustrated because he can't eat. Had to go back on vent at 0200 this am.   Vital Signs: Temp:  [98.1 F (36.7 C)-98.9 F (37.2 C)] 98.9 F (37.2 C) (06/06 0741) Pulse Rate:  [106-120] 108  (06/06 0811) Resp:  [18-35] 19  (06/06 0811) BP: (103-131)/(82-99) 109/85 mmHg (06/06 0811) SpO2:  [92 %-100 %] 99 % (06/06 0811) FiO2 (%):  [35 %-50 %] 40 % (06/06 0811) Weight:  [68.7 kg (151 lb 7.3 oz)] 68.7 kg (151 lb 7.3 oz) (06/05 2350)  Physical Examination: General - pleasant male, NAD up in chair HEENT - trach site clean Cardiac - s1s2 regular, no murmur Chest - resps even non labored on ATC, still some occasional rhonchi Abd - midline open wound with wound vac in place, tender with mild palpation, +bs Ext - no edema Neuro - follows commands, alert  CBC  Lab 03/23/12 0410 03/21/12 1223 03/20/12 1213  HGB 12.6* 11.3* 12.4*  HCT 38.9* 34.5* 36.8*  WBC 13.2* 13.0* 16.6*  PLT 440* 362 365    BMET  Lab 03/23/12 0410  03/22/12 0526 03/21/12 0405 03/19/12 0425 03/17/12 0518  NA 146* 145 144 136 135  K 3.5 3.6 -- -- --  CL 109 111 110 104 105  CO2 22 25 25 23 24   GLUCOSE 66* 83 107* 109* 105*  BUN 31* 24* 25* 17 18  CREATININE 1.01 0.86 0.84 0.69 0.59  CALCIUM 9.2 8.9 8.5 8.4 8.4  MG -- -- 2.0 -- --  PHOS -- -- 2.9 -- --     Lab Results  Component Value Date   ALT 33 03/22/2012   AST 18 03/22/2012   ALKPHOS 84 03/22/2012   BILITOT 0.5 03/22/2012    Intake/Output Summary (Last 24 hours) at 03/23/12 0906 Last data filed at 03/23/12 0800  Gross per 24 hour  Intake    210 ml  Output   1554 ml  Net  -1344 ml     Medications reviewed   ASSESSMENT AND PLAN  PULMONARY ETT:  5/14>>>5/19 >>> 5/21 Trach (DF) 5/22>> 5/14 CT chest>> b/l effusions and basilar ATX/consolidation, severe bullous lung disease with emphysema in upper lobes b/l. 6/6 PCXR: no sig change in bibasilar volume loss and atx.   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  Failed extubation, and s/p trach 5/22. Xtra long 6 cuffed 6/3>>>  Had to go on vent ~ 2am, but this is the longest he has gone off support. Think deconditioning after prolonged critical  illness is the major barrier to him coming of the vent 24/7, not sure how much his RLL volume loss is contributing.   Plan: -Cont to push ATC, and vent PRN at HS -cont scheduled BDs, will add ICS -add vibravest -repeat film in am, and evaluate clinically. Will consider therapeutic FOB for recruitment purposes.  Will npo for likely bronch in am , pending status in am   CARDIOVASCULAR left [place] CVL 5/14>>> 5/31 Rt femoral aline 5/14>>>out  HTN, Edema, Intermittent tachycardia. Actually think he may be vascularly dry  Plan: To 1/2 NS Chem in am   RENAL  Hypokalemia Plan: -replace and recheck  Hypernatremia Plan: -replace free water  Hold lasix  GASTROINTESTINAL  5/09  CT abd/pelvis>>SBO, RLQ transition point, small Rt inguinal hernia, diverticulosis 5/14 CT abd/pelvis>>ileus ABD Wound Vac 5/16>>  SBO s/p lap with SB resection 5/10 complicated by post-op ileus and fascial dehiscence, and return to OR 5/16. Plan: -post-op care per CCS, appreciate wound care  Dysphagia w/ evidence of penetration and probable aspiration observed on FEES completed 6/5.  P: -cont TFs. Npo in am , may need bronch friday  HEMATOLOGIC  Anemia of critical illness.  Plan: -transfuse for Hb < 7 -Wbc in am   INFECTIOUS  Cultures: BCX2 5/14 >>1/2 gram neg rod>>> 1/2 Klebsiella  UC 5/14 >>Enterococcus (sensitive to ampicillin) Sputum 5/14>> Klebsiella pneumoniae (pan sensitive) Wound 5/14>>Klebsiella Pneumoniae (pan sensitive) & Enterobacter Cloacae (resistant to ancef, cefoxitin) BC X2 5/20>>>ng Sputum 5/22>>>klebs, enterobacter  Antibiotics: vanc 5/14 >> 5/17 Zosyn 5/13 >>5/28  Wound infection, aspiration pneumonia, UTI. Plan: -monitor off abx  -cxr in am  If bronch will BAL  ENDOCRINE CBG (last 3)  Hyperglycemia., hypoglycemic on 6/6 Plan: -cont cbgs -add dextrose to IVFs  NEUROLOGIC  Acute metabolic encephalopathy 2nd to sepsis, respiratory failure, post-op pain, and ETOH.  Much improved.  Intolerant of lorazepam (does okay with midazolam). Plan: -PRN fentanyl for pain  -limit benzo's and haldol -pt/ot when ok with CCS  -To chair daily  Depression / Anxiety Plan: -began lexapro  5/31, hx of depression -was not on meds PTA (had stopped taking), ETOH abuse   DISPO - has no LTAC benefits, per social work and family   BABCOCK,PETE, NP 03/23/2012  9:06 AM   I have fully examined this patient and agree with above findings.    And edited in full  Mcarthur Rossetti. [name], MD, FACP Pgr: [phone] [facility]

## 2012-03-23 NOTE — Progress Notes (Signed)
21 Days Post-Op  Subjective: No new problems. Sitting up in chair. Alert. No specific complaints. Waiting to go to x-ray for [facility] tube placement.  Objective: Vital signs in last 24 hours: Temp:  [98.1 F (36.7 C)-98.9 F (37.2 C)] 98.9 F (37.2 C) (06/06 0741) Pulse Rate:  [106-120] 108  (06/06 0811) Resp:  [18-35] 19  (06/06 0811) BP: (96-131)/(74-99) 109/85 mmHg (06/06 0811) SpO2:  [92 %-100 %] 99 % (06/06 0811) FiO2 (%):  [35 %-50 %] 40 % (06/06 0811) Weight:  [151 lb 7.3 oz (68.7 kg)] 151 lb 7.3 oz (68.7 kg) (06/05 2350) Last BM Date: 03/22/12  Intake/Output from previous day: 06/05 0701 - 06/06 0700 In: 220 [I.V.:220] Out: 1454 [Urine:1100; Drains:350; Stool:4] Intake/Output this shift: Total I/O In: 20 [I.V.:20] Out: 100 [Urine:100]  General appearance: alert. Sitting in chair. No distress. Good conversation. Mental status  very good. Neck:  trachea midline, no tenderness/mass/nodules and trach site looks good. No bleeding or infection. GI: abdomen soft. Nondistended. Retention sutures in place. VAC in place with clear serosanguineous fluid.  Lab Results:  Results for orders placed during the hospital encounter of 02/24/12 (from the past 24 hour(s))  CBC     Status: Abnormal   Collection Time   03/23/12  4:10 AM      Component Value Range   WBC 13.2 (*) 4.0 - 10.5 (K/uL)   RBC 4.36  4.22 - 5.81 (MIL/uL)   Hemoglobin 12.6 (*) 13.0 - 17.0 (g/dL)   HCT 45.4 (*) 09.8 - 52.0 (%)   MCV 89.2  78.0 - 100.0 (fL)   MCH 28.9  26.0 - 34.0 (pg)   MCHC 32.4  30.0 - 36.0 (g/dL)   RDW 11.9  14.7 - 82.9 (%)   Platelets 440 (*) 150 - 400 (K/uL)  BASIC METABOLIC PANEL     Status: Abnormal   Collection Time   03/23/12  4:10 AM      Component Value Range   Sodium 146 (*) 135 - 145 (mEq/L)   Potassium 3.5  3.5 - 5.1 (mEq/L)   Chloride 109  96 - 112 (mEq/L)   CO2 22  19 - 32 (mEq/L)   Glucose, Bld 66 (*) 70 - 99 (mg/dL)   BUN 31 (*) 6 - 23 (mg/dL)   Creatinine, Ser 5.62  0.50 -  1.35 (mg/dL)   Calcium 9.2  8.4 - 13.0 (mg/dL)   GFR calc non Af Amer 76 (*) >90 (mL/min)   GFR calc Af Amer 88 (*) >90 (mL/min)     Studies/Results: @RISRSLT24 @     . albuterol  2.5 mg Nebulization QID  . antiseptic oral rinse  15 mL Mouth Rinse QID  . budesonide  0.5 mg Nebulization BID  . chlorhexidine  15 mL Mouth Rinse BID  . enoxaparin  40 mg Subcutaneous Q24H  . escitalopram  10 mg Oral Daily  . feeding supplement  30 mL Per Tube QID  . furosemide  40 mg Intravenous Once  . metoprolol  2.5 mg Intravenous Q8H  . pantoprazole (PROTONIX) IV  40 mg Intravenous QHS  . sodium chloride  3 mL Intravenous Q12H     Assessment/Plan: s/p Procedure(s): DEBRIDEMENT CLOSURE/ABDOMINAL WOUND  Status post expiratory laparotomy lysis of adhesions for SBO at [facility].  Postoperative respiratory failure requiring transfer to [facility]. Subsequent interventions have included tracheostomy and ventillator care for PNA, now being weaned. Also status post abdominal exploration and wound debridement and closure of abdominal wound dehiscence.  From an abdominal  standpoint he is doing well. His panda tube is to be replaced to facilitate nutritional support. VAC will be changed Monday Wednesday Friday.  Will follow.    LOS: 28 days    Shaman Muscarella M. Derrell Lolling, M.D., [facility] General and Minimally invasive Surgery Breast and Colorectal Surgery Office:   [phone] Pager:   [phone]  03/23/2012  . .prob

## 2012-03-23 NOTE — Progress Notes (Signed)
Physical Therapy Treatment Patient Details Name: Michael Ellison MRN: [id number] DOB: January 07, 1946 Today's Date: 03/23/2012 Time: 6295-2841 PT Time Calculation (min): 27 min  PT Assessment / Plan / Recommendation Comments on Treatment Session  Patient s/p SB resection with fascial dehiscence with VDRF with trach.  Patient is progressing with ambulation needing less assist today.  Safety is improving as well.      Follow Up Recommendations  Skilled nursing facility;Supervision/Assistance - 24 hour    Barriers to Discharge  None      Equipment Recommendations  Defer to next venue    Recommendations for Other Services  None  Frequency Min 3X/week   Plan Discharge plan remains appropriate;Frequency remains appropriate    Precautions / Restrictions Precautions Precautions: Fall Precaution Comments: trach at 40% Restrictions Weight Bearing Restrictions: No   Pertinent Vitals/Pain VSS/ No pain    Mobility  Bed Mobility Bed Mobility: Rolling Right;Right Sidelying to Sit;Sitting - Scoot to Edge of Bed Rolling Right: 5: Supervision Right Sidelying to Sit: 5: Supervision;With rails;HOB flat Supine to Sit: Not tested (comment) Sitting - Scoot to Edge of Bed: 5: Supervision Sit to Supine: Not Tested (comment) Details for Bed Mobility Assistance: no cues needed Transfers Transfers: Sit to Stand;Stand to Sit Sit to Stand: 4: Min guard;With upper extremity assist;From bed Stand to Sit: 4: Min guard;With upper extremity assist;To chair/3-in-1 Stand Pivot Transfers: Not tested (comment) Details for Transfer Assistance: no cues needed for hand placement, did stand rather quickly. Ambulation/Gait Ambulation/Gait Assistance: 4: Min guard (+1 for lines) Ambulation Distance (Feet): 300 Feet Assistive device: Rolling walker Ambulation/Gait Assistance Details: Not unsteady today.  Only 2 standing rest breaks.  Improvement in stability.  Technique and safety good in controlled environment.    Gait Pattern: Step-through pattern;Decreased step length - right;Decreased step length - left;Decreased stride length;Shuffle Gait velocity: Slow cadence Stairs: No Wheelchair Mobility Wheelchair Mobility: No    Exercises General Exercises - Lower Extremity Ankle Circles/Pumps: AROM;Both;5 reps;Seated Long Arc Quad: AROM;Both;10 reps;Seated    PT Goals Acute Rehab PT Goals PT Goal: Supine/Side to Sit - Progress: Progressing toward goal PT Goal: Sit at Edge Of Bed - Progress: Progressing toward goal PT Goal: Sit to Stand - Progress: Progressing toward goal PT Goal: Stand to Sit - Progress: Progressing toward goal PT Transfer Goal: Bed to Chair/Chair to Bed - Progress: Progressing toward goal  Visit Information  Last PT Received On: 03/23/12 Assistance Needed: +2    Subjective Data  Subjective: Patient with PMV in place during rx.  Speaking well with PMV   Cognition  Overall Cognitive Status: Appears within functional limits for tasks assessed/performed Difficult to assess due to: Tracheostomy Arousal/Alertness: Awake/alert Orientation Level: Appears intact for tasks assessed Behavior During Session: [facility] for tasks performed    Balance  Static Sitting Balance Static Sitting - Balance Support: No upper extremity supported;Feet supported Static Sitting - Level of Assistance: 6: Modified independent (Device/Increase time) Static Sitting - Comment/# of Minutes: 5 Static Standing Balance Static Standing - Balance Support: Bilateral upper extremity supported;During functional activity Static Standing - Level of Assistance: 5: Stand by assistance  End of Session PT - End of Session Equipment Utilized During Treatment: Gait belt Activity Tolerance: Patient tolerated treatment well Patient left: in chair;with call bell/phone within reach Nurse Communication: Mobility status    INGOLD,Kian Gamarra 03/23/2012, 10:27 AM  [facility] [phone]  (pager)

## 2012-03-24 ENCOUNTER — Inpatient Hospital Stay (HOSPITAL_COMMUNITY): Payer: Medicare Other

## 2012-03-24 LAB — CBC
Hemoglobin: 12.4 g/dL — ABNORMAL LOW (ref 13.0–17.0)
MCH: 27.7 pg (ref 26.0–34.0)
MCHC: 31 g/dL (ref 30.0–36.0)
Platelets: 402 10*3/uL — ABNORMAL HIGH (ref 150–400)
RDW: 14.2 % (ref 11.5–15.5)

## 2012-03-24 LAB — BASIC METABOLIC PANEL
Calcium: 9 mg/dL (ref 8.4–10.5)
GFR calc Af Amer: >90 mL/min (ref >90)
GFR calc non Af Amer: 85 mL/min — ABNORMAL LOW (ref >90)
Glucose, Bld: 113 mg/dL — ABNORMAL HIGH (ref 70–99)
Sodium: 147 mEq/L — ABNORMAL HIGH (ref 135–145)

## 2012-03-24 MED ORDER — VANCOMYCIN HCL IN DEXTROSE 1-5 GM/200ML-% IV SOLN: 1000.0000 mg | Freq: Two times a day (BID) | INTRAVENOUS | Status: DC

## 2012-03-24 MED ORDER — ETOMIDATE 2 MG/ML IV SOLN
INTRAVENOUS | Status: AC
  Administered 2012-03-24: 20 mg via INTRAVENOUS
  Filled 2012-03-24: qty 10

## 2012-03-24 MED ORDER — VANCOMYCIN HCL 1000 MG IV SOLR
750.0000 mg | Freq: Two times a day (BID) | INTRAVENOUS | Status: DC
  Administered 2012-03-25 – 2012-03-27 (×5): 750 mg via INTRAVENOUS
  Filled 2012-03-24 (×5): qty 750

## 2012-03-24 MED ORDER — DEXTROSE 5 % IV SOLN
1.0000 g | Freq: Three times a day (TID) | INTRAVENOUS | Status: DC
  Administered 2012-03-24 – 2012-04-07 (×42): 1 g via INTRAVENOUS
  Filled 2012-03-24 (×46): qty 1

## 2012-03-24 MED ORDER — ETOMIDATE 2 MG/ML IV SOLN
20.0000 mg | Freq: Once | INTRAVENOUS | Status: AC
  Administered 2012-03-24: 20 mg via INTRAVENOUS

## 2012-03-24 MED ORDER — MIDAZOLAM HCL 2 MG/2ML IJ SOLN: 2.0000 mg | Freq: Once | INTRAMUSCULAR | Status: DC

## 2012-03-24 MED ORDER — ACETYLCYSTEINE 20 % IN SOLN
4.0000 mL | Freq: Once | RESPIRATORY_TRACT | Status: AC
  Administered 2012-03-24: 4 mL via RESPIRATORY_TRACT
  Filled 2012-03-24: qty 4

## 2012-03-24 MED ORDER — VANCOMYCIN HCL 1000 MG IV SOLR
1500.0000 mg | Freq: Once | INTRAVENOUS | Status: AC
  Administered 2012-03-24: 1500 mg via INTRAVENOUS
  Filled 2012-03-24: qty 1500

## 2012-03-24 MED ORDER — FENTANYL CITRATE 0.05 MG/ML IJ SOLN
100.0000 ug | Freq: Once | INTRAMUSCULAR | Status: DC
  Filled 2012-03-24 (×2): qty 2

## 2012-03-24 MED ORDER — PRO-STAT SUGAR FREE PO LIQD
30.0000 mL | Freq: Two times a day (BID) | ORAL | Status: DC
  Administered 2012-03-24 – 2012-03-31 (×15): 30 mL
  Filled 2012-03-24 (×17): qty 30

## 2012-03-24 MED ORDER — ETOMIDATE 2 MG/ML IV SOLN
INTRAVENOUS | Status: AC
  Administered 2012-03-24: 20 mg
  Filled 2012-03-24: qty 10

## 2012-03-24 MED ORDER — MIDAZOLAM HCL 2 MG/2ML IJ SOLN
INTRAMUSCULAR | Status: AC
  Administered 2012-03-24: 2 mg
  Filled 2012-03-24: qty 4

## 2012-03-24 MED ORDER — FREE WATER
200.0000 mL | Status: DC
  Administered 2012-03-24 – 2012-04-16 (×135): 200 mL

## 2012-03-24 MED ORDER — ALPRAZOLAM 0.5 MG PO TABS
1.0000 mg | ORAL_TABLET | Freq: Three times a day (TID) | ORAL | Status: DC | PRN
  Administered 2012-03-24 – 2012-04-11 (×12): 1 mg
  Filled 2012-03-24 (×6): qty 4
  Filled 2012-03-24: qty 3
  Filled 2012-03-24: qty 1
  Filled 2012-03-24 (×5): qty 4

## 2012-03-24 NOTE — Progress Notes (Signed)
Pt c/o having trouble breathing, RT here and examined pt.  Lungs clear but diminished, sats in high 90s. Rate in the 30s.  Dr. Delford Field notified, order to give xanax via panda received. Will monitor for continued elevated RR.

## 2012-03-24 NOTE — Progress Notes (Signed)
22 Days Post-Op  Subjective: Comfortable. Alert. Sitting up in chair. No complaints.  Panda replaced yesterday and he received 14 hours and tube feedings without problems. Tube feedings currently being held because CCM considering bronchoscopy today.  Objective: Vital signs in last 24 hours: Temp:  [97.6 F (36.4 C)-99.9 F (37.7 C)] 98 F (36.7 C) (06/07 0340) Pulse Rate:  [103-115] 104  (06/07 0340) Resp:  [19-96] 35  (06/07 0340) BP: (96-133)/(74-96) 117/87 mmHg (06/07 0340) SpO2:  [90 %-99 %] 97 % (06/07 0340) FiO2 (%):  [35 %-40 %] 35 % (06/07 0340) Weight:  [150 lb 5.7 oz (68.2 kg)] 150 lb 5.7 oz (68.2 kg) (06/07 0020) Last BM Date: 03/23/12  Intake/Output from previous day: 06/06 0701 - 06/07 0700 In: 1061.3 [I.V.:481.3; NG/GT:580] Out: 890 [Urine:850; Drains:40] Intake/Output this shift: Total I/O In: 240 [I.V.:90; NG/GT:150] Out: 450 [Urine:450]  General appearance: alert. No distress. Medical status normal. Up in chair. The tracheostomy tube looks good. GI: abdomen soft. Nontender. Back dressing in place with  bland serosanguineous drainage  Lab Results:  Results for orders placed during the hospital encounter of 02/24/12 (from the past 24 hour(s))  CBC     Status: Abnormal   Collection Time   03/24/12  4:10 AM      Component Value Range   WBC 10.4  4.0 - 10.5 (K/uL)   RBC 4.47  4.22 - 5.81 (MIL/uL)   Hemoglobin 12.4 (*) 13.0 - 17.0 (g/dL)   HCT 16.1  09.6 - 04.5 (%)   MCV 89.5  78.0 - 100.0 (fL)   MCH 27.7  26.0 - 34.0 (pg)   MCHC 31.0  30.0 - 36.0 (g/dL)   RDW 40.9  81.1 - 91.4 (%)   Platelets 402 (*) 150 - 400 (K/uL)  BASIC METABOLIC PANEL     Status: Abnormal   Collection Time   03/24/12  4:10 AM      Component Value Range   Sodium 147 (*) 135 - 145 (mEq/L)   Potassium 3.9  3.5 - 5.1 (mEq/L)   Chloride 112  96 - 112 (mEq/L)   CO2 24  19 - 32 (mEq/L)   Glucose, Bld 113 (*) 70 - 99 (mg/dL)   BUN 42 (*) 6 - 23 (mg/dL)   Creatinine, Ser 7.82  0.50 - 1.35  (mg/dL)   Calcium 9.0  8.4 - 95.6 (mg/dL)   GFR calc non Af Amer 85 (*) >90 (mL/min)   GFR calc Af Amer >90  >90 (mL/min)     Studies/Results: @RISRSLT24 @     . albuterol  2.5 mg Nebulization QID  . antiseptic oral rinse  15 mL Mouth Rinse QID  . budesonide  0.5 mg Nebulization BID  . chlorhexidine  15 mL Mouth Rinse BID  . enoxaparin  40 mg Subcutaneous Q24H  . escitalopram  10 mg Oral Daily  . feeding supplement  30 mL Per Tube QID  . free water  200 mL Per Tube Q6H  . metoprolol  2.5 mg Intravenous Q8H  . pantoprazole (PROTONIX) IV  40 mg Intravenous QHS  . potassium chloride  40 mEq Per Tube Once  . sodium chloride  3 mL Intravenous Q12H     Assessment/Plan: s/p Procedure(s): DEBRIDEMENT CLOSURE/ABDOMINAL WOUND   Status post exploratory laparotomy and lysis of adhesions for SBO at any [facility].  Status post transfer to [facility] for respiratory failure requiring tracheostomy and subsequent abdominal exploration and wound debridement and closure for abdominal wound dehiscence.  VAC change today.  From an abdominal and GI standpoint he is doing well. I suspect that he will need  the tube feeds until the trach is   decannulated. At that time he probably will be able to swallow fine.  Continue pulmonary management per CCM.  Continue PT    LOS: 29 days    Dayla Gasca M 03/24/2012  . .prob

## 2012-03-24 NOTE — Progress Notes (Signed)
Name: Michael Ellison MRN: [id number] DOB: 1946/08/07    LOS: 29  PULMONARY / CRITICAL CARE MEDICINE  Pt Profile:   66 y/o male smoker admitted to [facility] 02/24/2012 with n/v and abdominal pain due to SBO due to adhesions.  Had ex lap with SB resection 5/10.  Developed delirium post-op.  Transferred to [facility] 5/14 for respiratory failure requiring intubation.  Developed abdominal fascial dehiscence and taken back to OR 5/16.  Failed extubation, and required tracheostomy 5/22. PMHx ETOH, HTN, Prostate cancer   Interval hx:  5/16: OR for fascial dehiscence repair- closed with wound vac 5/18- failed weaning 5/19- extubated 5/21- re-intubated for hypercarbia 5/22-improved co2 and neurostatus, pos almost 2 liters 5/22 SVT>>resolved spontaneously 5/30 -off vent for 24 hours, planned Panda placement 6/1> up in chair, improved. 6/3- desat, increased WOB, vent 6/3- leak trach, improved with extra long 6/5: FEES eval: failed, still showing evidence of aspiration.   Subjective: Back on vent . Mucous plug  Vital Signs: Temp:  [97.6 F (36.4 C)-99.9 F (37.7 C)] 98 F (36.7 C) (06/07 0827) Pulse Rate:  [102-115] 107  (06/07 0935) Resp:  [21-96] 21  (06/07 0935) BP: (111-133)/(76-96) 123/79 mmHg (06/07 0935) SpO2:  [87 %-98 %] 95 % (06/07 0935) FiO2 (%):  [35 %-40 %] 40 % (06/07 0935) Weight:  [68.2 kg (150 lb 5.7 oz)] 68.2 kg (150 lb 5.7 oz) (06/07 0020)  Physical Examination: General - pleasant male, NAD up in chair HEENT - trach site clean Cardiac - s1s2 regular, no murmur Chest - resps even non labored, back on vent, scattered rhonchi Abd - midline open wound with wound vac in place, tender with mild palpation, +bs Ext - no edema Neuro - follows commands, alert  CBC  Lab 03/24/12 0410 03/23/12 0410 03/21/12 1223  HGB 12.4* 12.6* 11.3*  HCT 40.0 38.9* 34.5*  WBC 10.4 13.2* 13.0*  PLT 402* 440* 362    BMET  Lab 03/24/12 0410 03/23/12 0410 03/22/12 0526 03/21/12 0405 03/19/12  0425  NA 147* 146* 145 144 136  K 3.9 3.5 -- -- --  CL 112 109 111 110 104  CO2 24 22 25 25 23   GLUCOSE 113* 66* 83 107* 109*  BUN 42* 31* 24* 25* 17  CREATININE 0.97 1.01 0.86 0.84 0.69  CALCIUM 9.0 9.2 8.9 8.5 8.4  MG -- -- -- 2.0 --  PHOS -- -- -- 2.9 --     Lab Results  Component Value Date   ALT 33 03/22/2012   AST 18 03/22/2012   ALKPHOS 84 03/22/2012   BILITOT 0.5 03/22/2012    Intake/Output Summary (Last 24 hours) at 03/24/12 1134 Last data filed at 03/24/12 1000  Gross per 24 hour  Intake   1005 ml  Output    795 ml  Net    210 ml     Medications reviewed   ASSESSMENT AND PLAN  PULMONARY ETT:  5/14>>>5/19 >>> 5/21 Trach (DF) 5/22>> 5/14 CT chest>> b/l effusions and basilar ATX/consolidation, severe bullous lung disease with emphysema in upper lobes b/l. 6/7 PCXR: no sig change in bibasilar volume loss and atx. 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  Failed extubation, and s/p trach 5/22. Xtra long 6 cuffed 6/3>>>  Had to go on vent this am after being off vent all night. Think deconditioning after prolonged critical illness is the major barrier to him coming of the vent 24/7,  not sure how much his RLL volume loss is contributing.  Plan: -He has continue to fail, mucous plug, ATX on pcxr: destatration, RT just suctioned thick secretions = Bronch -cont scheduled BDs, will add ICS -cont vibravest  -FOB to see if we can recruit him.  NO further Trach collar until am , pending bronch result  CARDIOVASCULAR left [place] CVL 5/14>>> 5/31 Rt femoral aline 5/14>>>out  HTN, Edema, Intermittent tachycardia. Actually think he may be vascularly dry  Plan: To 1/2 NS Chem in am   RENAL  Hypernatremia Plan: -replace free water  -Hold lasix,, hemoconcetrated  GASTROINTESTINAL  5/09 CT abd/pelvis>>SBO, RLQ transition point, small Rt inguinal hernia,  diverticulosis 5/14 CT abd/pelvis>>ileus ABD Wound Vac 5/16>>  SBO s/p lap with SB resection 5/10 complicated by post-op ileus and fascial dehiscence, and return to OR 5/16. Plan: -post-op care per CCS, appreciate wound care  Dysphagia w/ evidence of penetration and probable aspiration observed on FEES completed 6/5.  P: -resume TF after bronch  HEMATOLOGIC  Anemia of critical illness.  Plan: -transfuse for Hb < 7 -Wbc in am   INFECTIOUS  Cultures: BCX2 5/14 >>1/2 gram neg rod>>> 1/2 Klebsiella  UC 5/14 >>Enterococcus (sensitive to ampicillin) Sputum 5/14>> Klebsiella pneumoniae (pan sensitive) Wound 5/14>>Klebsiella Pneumoniae (pan sensitive) & Enterobacter Cloacae (resistant to ancef, cefoxitin) BC X2 5/20>>>ng Sputum 5/22>>>klebs, enterobacter  Antibiotics: vanc 5/14 >> 5/17 Zosyn 5/13 >>5/28  Wound infection, aspiration pneumonia, UTI. Plan: -monitor off abx  -cxr in am  -If bronch will BAL  ENDOCRINE CBG (last 3)  Hyperglycemia., hypoglycemic on 6/6 Plan: -cont cbgs -add dextrose to IVFs  NEUROLOGIC  Acute metabolic encephalopathy 2nd to sepsis, respiratory failure, post-op pain, and ETOH.  Much improved.  Intolerant of lorazepam (does okay with midazolam). Plan: -PRN fentanyl for pain  -limit benzo's and haldol -pt/ot when ok with CCS  -To chair daily  Depression / Anxiety Plan: -began lexapro  5/31, hx of depression -was not on meds PTA (had stopped taking), ETOH abuse   DISPO - has no LTAC benefits, per social work and family   BABCOCK,PETE, 03/24/2012  11:34 AM  I have fully examined this patient and agree with above findings.    And edited in full  Mcarthur Rossetti. [name], MD, FACP Pgr: [phone] [facility]

## 2012-03-24 NOTE — Progress Notes (Signed)
Nutrition Follow-up  Panda tube inadvertently removed on 6/4. FEES subsequently scheduled to assess readiness for PO intake on 6/5, however SLP recommended continuation of NPO. Panda replaced on 6/6 and radiology confirmed tip of feeding tube is within the distal second portion of the duodenum. Pt received 14 hours of tube feeding when panda replaced before TF were held again for possible bronchoscopy. Continues on intermittent vent support.  RN reports TF remains held at this time for bronchoscopy. No reports of difficulty tolerating prior to being held, per RN.  Diet Order:  NPO TF: Osmolite 1.2 at 60 ml/hr with 30 ml Prostat QID, provides 2128 kcal and 150 grams protein. On hold.  Meds: Scheduled Meds:   . albuterol  2.5 mg Nebulization QID  . antiseptic oral rinse  15 mL Mouth Rinse QID  . budesonide  0.5 mg Nebulization BID  . chlorhexidine  15 mL Mouth Rinse BID  . enoxaparin  40 mg Subcutaneous Q24H  . escitalopram  10 mg Oral Daily  . feeding supplement  30 mL Per Tube QID  . free water  200 mL Per Tube Q4H  . metoprolol  2.5 mg Intravenous Q8H  . pantoprazole (PROTONIX) IV  40 mg Intravenous QHS  . potassium chloride  40 mEq Per Tube Once  . sodium chloride  3 mL Intravenous Q12H  . DISCONTD: free water  200 mL Per Tube Q6H   Continuous Infusions:   . dextrose 5 % and 0.45% NaCl 10 mL/hr at 03/23/12 1445  . feeding supplement (OSMOLITE 1.2 CAL) 1,000 mL (03/23/12 1440)  . DISCONTD: dextrose 5 % and 0.45% NaCl 1,000 mL (03/23/12 0943)   PRN Meds:.albuterol, bisacodyl, fentaNYL, hydrALAZINE, iohexol, phenol  Labs:  CMP     Component Value Date/Time   NA 147* 03/24/2012 0410   K 3.9 03/24/2012 0410   CL 112 03/24/2012 0410   CO2 24 03/24/2012 0410   GLUCOSE 113* 03/24/2012 0410   BUN 42* 03/24/2012 0410   CREATININE 0.97 03/24/2012 0410   CALCIUM 9.0 03/24/2012 0410   PROT 7.5 03/22/2012 0526   ALBUMIN 2.2* 03/22/2012 0526   AST 18 03/22/2012 0526   ALT 33 03/22/2012 0526   ALKPHOS 84  03/22/2012 0526   BILITOT 0.5 03/22/2012 0526   GFRNONAA 85* 03/24/2012 0410   GFRAA >90 03/24/2012 0410     Intake/Output Summary (Last 24 hours) at 03/24/12 1209 Last data filed at 03/24/12 1200  Gross per 24 hour  Intake    950 ml  Output    795 ml  Net    155 ml  BM on 6/6  Weight Status:  68.2 kg (150 lb) - wt declining likely 2/2 TF off for various reasons  Body mass index is 19.30 kg/(m^2). WNL  Re-estimated needs:  1900-2000 kcals, 105-120 grams protein daily  Nutrition Dx:  Inadequate oral intake - ongoing.  Goal:  Intake to meet 90-100% of estimated nutrition needs, unem  Intervention:   1. RD to decrease Prostat to BID to better meet nutrition needs; Osmolite 1.5 at 60 ml/hr with Prostat 30 ml BID will provide: 1928 kcal, 110 grams protein, 1181 ml free water. 2. RD to continue to follow nutrition care plan  Monitor:  Weights, labs, PO intake, I/O's, vent use  Adair Laundry Pager #:  [phone]

## 2012-03-24 NOTE — Therapy (Addendum)
Patient became tachypneic. Pulse and BP stable but pt extremely anxious and RR >45bpm . Sats dropped to mid 80's and would not come up. Placed on PRVC 600/12/40%. I had to put patient on 100% for several minures to get sats to increase and RR to decrease. Lavaged and suctioned. Peak pressures dropped 8cm immediately and pt slightly more comfortable.

## 2012-03-24 NOTE — Progress Notes (Signed)
CSW reviewed chart and staffed pt with RNCM. Aware at this time Vent SNF placement may be necessary and will meet with pt/family on Monday to discuss disposition and begin searching for a facility.  Baxter Flattery, MSW [phone]

## 2012-03-24 NOTE — Progress Notes (Signed)
ANTIBIOTIC CONSULT NOTE - INITIAL  Pharmacy Consult for vancomycin and ceftazadime Indication: pneumonia  Allergies  Allergen Reactions  . Lorazepam Other (See Comments)    Severe agitated delirium. Tolerates midazolam and other benzo's    Patient Measurements: Height: 6\' 2"  (188 cm) Weight: 150 lb 5.7 oz (68.2 kg) IBW/kg (Calculated) : 82.2  Adjusted Body Weight:   Vital Signs: Temp: 98 F (36.7 C) (06/07 0827) Temp src: Oral (06/07 0827) BP: 117/83 mmHg (06/07 1208) Pulse Rate: 110  (06/07 1208) Intake/Output from previous day: 06/06 0701 - 06/07 0700 In: 1091.3 [I.V.:511.3; NG/GT:580] Out: 895 [Urine:850; Drains:45] Intake/Output from this shift: Total I/O In: 60 [I.V.:60] Out: -   Labs:  Basename 03/24/12 0410 03/23/12 0410 03/22/12 0526  WBC 10.4 13.2* --  HGB 12.4* 12.6* --  PLT 402* 440* --  LABCREA -- -- --  CREATININE 0.97 1.01 0.86   Estimated Creatinine Clearance: 73.2 ml/min (by C-G formula based on Cr of 0.97). No results found for this basename: VANCOTROUGH:2,VANCOPEAK:2,VANCORANDOM:2,GENTTROUGH:2,GENTPEAK:2,GENTRANDOM:2,TOBRATROUGH:2,TOBRAPEAK:2,TOBRARND:2,AMIKACINPEAK:2,AMIKACINTROU:2,AMIKACIN:2, in the last 72 hours   Microbiology: Recent Results (from the past 720 hour(s))  SURGICAL PCR SCREEN     Status: Normal   Collection Time   02/25/12 10:31 AM      Component Value Range Status Comment   MRSA, PCR NEGATIVE  NEGATIVE  Final    Staphylococcus aureus NEGATIVE  NEGATIVE  Final   WOUND CULTURE     Status: Normal   Collection Time   02/29/12  9:22 AM      Component Value Range Status Comment   Specimen Description WOUND ABDOMEN INCISION SITE   Final    Special Requests NONE   Final    Gram Stain     Final    Value: FEW WBC PRESENT,BOTH PMN AND MONONUCLEAR     NO SQUAMOUS EPITHELIAL CELLS SEEN     RARE GRAM POSITIVE RODS     FEW GRAM NEGATIVE RODS   Culture     Final    Value: MODERATE KLEBSIELLA PNEUMONIAE     MODERATE ENTEROBACTER  CLOACAE   Report Status 03/03/2012 FINAL   Final    Organism ID, Bacteria KLEBSIELLA PNEUMONIAE   Final    Organism ID, Bacteria ENTEROBACTER CLOACAE   Final   MRSA PCR SCREENING     Status: Normal   Collection Time   02/29/12  4:15 PM      Component Value Range Status Comment   MRSA by PCR NEGATIVE  NEGATIVE  Final   CULTURE, BLOOD (ROUTINE X 2)     Status: Normal   Collection Time   02/29/12  4:55 PM      Component Value Range Status Comment   Specimen Description BLOOD HAND LEFT   Final    Special Requests BOTTLES DRAWN AEROBIC ONLY 5CC   Final    Culture  Setup Time [id number]   Final    Culture     Final    Value: KLEBSIELLA PNEUMONIAE     Note: Gram Stain Report Called to,Read Back By and Verified With: Sharion Balloon RN on 03/05/12 at 21:22 by Christie Nottingham   Report Status 03/07/2012 FINAL   Final    Organism ID, Bacteria KLEBSIELLA PNEUMONIAE   Final   URINE CULTURE     Status: Normal   Collection Time   02/29/12  5:02 PM      Component Value Range Status Comment   Specimen Description URINE, CLEAN CATCH   Final    Special Requests NONE  Final    Culture  Setup Time [id number]   Final    Colony Count 20,OOO COLONIES/ML   Final    Culture ENTEROCOCCUS SPECIES   Final    Report Status 03/04/2012 FINAL   Final    Organism ID, Bacteria ENTEROCOCCUS SPECIES   Final   CULTURE, BLOOD (ROUTINE X 2)     Status: Normal   Collection Time   02/29/12  5:02 PM      Component Value Range Status Comment   Specimen Description BLOOD ARM RIGHT   Final    Special Requests BOTTLES DRAWN AEROBIC AND ANAEROBIC 10CC   Final    Culture  Setup Time [id number]   Final    Culture NO GROWTH 5 DAYS   Final    Report Status 03/07/2012 FINAL   Final   CULTURE, RESPIRATORY     Status: Normal   Collection Time   02/29/12  7:22 PM      Component Value Range Status Comment   Specimen Description ENDOTRACHEAL   Final    Special Requests NONE   Final    Gram Stain     Final    Value: FEW WBC  PRESENT,BOTH PMN AND MONONUCLEAR     NO SQUAMOUS EPITHELIAL CELLS SEEN     NO ORGANISMS SEEN   Culture FEW KLEBSIELLA PNEUMONIAE   Final    Report Status 03/03/2012 FINAL   Final    Organism ID, Bacteria KLEBSIELLA PNEUMONIAE   Final   CULTURE, BLOOD (ROUTINE X 2)     Status: Normal   Collection Time   03/06/12  2:40 PM      Component Value Range Status Comment   Specimen Description BLOOD RIGHT ANTECUBITAL   Final    Special Requests BOTTLES DRAWN AEROBIC AND ANAEROBIC 10CC   Final    Culture  Setup Time [id number]   Final    Culture NO GROWTH 5 DAYS   Final    Report Status 03/12/2012 FINAL   Final   CULTURE, BLOOD (ROUTINE X 2)     Status: Normal   Collection Time   03/06/12  2:45 PM      Component Value Range Status Comment   Specimen Description BLOOD RIGHT ARM   Final    Special Requests BOTTLES DRAWN AEROBIC AND ANAEROBIC 10CC   Final    Culture  Setup Time [id number]   Final    Culture NO GROWTH 5 DAYS   Final    Report Status 03/12/2012 FINAL   Final   CULTURE, RESPIRATORY     Status: Normal   Collection Time   03/08/12 10:26 AM      Component Value Range Status Comment   Specimen Description TRACHEAL ASPIRATE   Final    Special Requests NONE   Final    Gram Stain     Final    Value: MODERATE WBC PRESENT,BOTH PMN AND MONONUCLEAR     FEW SQUAMOUS EPITHELIAL CELLS PRESENT     NO ORGANISMS SEEN   Culture     Final    Value: ABUNDANT KLEBSIELLA PNEUMONIAE     ABUNDANT ENTEROBACTER CLOACAE   Report Status 03/11/2012 FINAL   Final    Organism ID, Bacteria KLEBSIELLA PNEUMONIAE   Final    Organism ID, Bacteria ENTEROBACTER CLOACAE   Final     Medical History: Past Medical History  Diagnosis Date  . Hypertension   . Cancer   . Cancer of prostate     Medications:  Scheduled:    . acetylcysteine  4 mL Nebulization Once  . albuterol  2.5 mg Nebulization QID  . antiseptic oral rinse  15 mL Mouth Rinse QID  . budesonide  0.5 mg Nebulization BID  . chlorhexidine   15 mL Mouth Rinse BID  . enoxaparin  40 mg Subcutaneous Q24H  . escitalopram  10 mg Oral Daily  . etomidate      . etomidate      . feeding supplement  30 mL Per Tube QID  . feeding supplement  30 mL Per Tube BID  . free water  200 mL Per Tube Q4H  . metoprolol  2.5 mg Intravenous Q8H  . midazolam      . pantoprazole (PROTONIX) IV  40 mg Intravenous QHS  . potassium chloride  40 mEq Per Tube Once  . sodium chloride  3 mL Intravenous Q12H  . DISCONTD: free water  200 mL Per Tube Q6H  . DISCONTD: vancomycin  1,000 mg Intravenous Q12H   Assessment: 66 YOM admitted 5/9 with N/V and abd pain s/p ex lab and SB resection d/t SBO/adhesions. He has not been able to stay off vent, has tracheostomy.  Orders to start vancomycin and ceftazidime for pneumonia.  Bronchoscopy done today with BAL, await cultures.   Zosyn 5/13 >>5/27 Vanc 5/14 >>5/17, 6/7 >> Mycamine 5/14 >> 5/15 Ceftazidime 6/7 >>   5/14 abd incision site -mod Kleb Pneumo (pan sens), mod Enterobacter Clo. (pan sens except R ancef) 5/14 resp cx - few Kleb Pneumo (pan sens) 5/14 blood cx - 1/2 klebsiella (pan sens) 5/14 Urine: 20k Enterococcus (pan sens) 5/14 MRSA PCR: neg 5/22 Resp: abundant gnr now klebsiella and enterobacter, klebsiella pan-sensitive but enterobacter resistant to pip/tazo 5/20 Blood: NG  Goal of Therapy:  Vancomycin trough level 15-20 mcg/ml  Plan:  1. Vancomycin 1500mg  IV x 1 then 750mg  IV q12h.  Follow-up with steady state trough if on vancomycin >4-5 days or change in renal fx/clinical status 2. Ceftazidime 1gm IV q8h  Dannielle Huh 03/24/2012,2:01 PM

## 2012-03-24 NOTE — Procedures (Signed)
Bronchoscopy Procedure Note Michael Ellison [id number] [birth year]  Procedure: Bronchoscopy Indications: Diagnostic evaluation of the airways, Obtain specimens for culture and/or other diagnostic studies and Remove secretions  Procedure Details Consent: Risks of procedure as well as the alternatives and risks of each were explained to the (patient/caregiver).  Consent for procedure obtained. Time Out: Verified patient identification, verified procedure, site/side was marked, verified correct patient position, special equipment/implants available, medications/allergies/relevent history reviewed, required imaging and test results available.  Performed  In preparation for procedure, patient was given 100% FiO2. Sedation: Etomidate  Airway entered and the following bronchi were examined: RUL, RML, RLL, LUL, LLL and Bronchi.   Procedures performed: Brushings performed Bronchoscope removed.  , Patient placed back on 100% FiO2 at conclusion of procedure.    Evaluation Hemodynamic Status: BP stable throughout; O2 sats: stable throughout Patient's Current Condition: stable Specimens:  Sent purulent fluid Complications: No apparent complications Patient did tolerate procedure well.   Nelda Bucks. 03/24/2012  Findings: 1. Complete collapse thick white pus left lung , all lobes, all segments, severe, all suctioned clear eventually 2. Pus in trachea as well from left 3. Minimal pus BI 4. Mild self resolving bronch trauma upper division 5. BALL lingula pus white thick 6. Mucomysts 20%, 6 cc applied left  Called daughter updated Add abx, peep 10 , no TC for 38 hrs pcxr now  [employer]. [name], MD, FACP Pgr: [phone] [facility]

## 2012-03-24 NOTE — Procedures (Signed)
Bedside Bronchoscopy Procedure Note DURWARD MATRANGA [id number] 10-02-1946  Procedure: Bronchoscopy Indications: Obtain specimens for culture and/or other diagnostic studies and Remove secretions  Procedure Details: ET Tube Size: ET Tube secured at lip (cm): Bite block in place: No In preparation for procedure, Patient hyper-oxygenated with 100 % FiO2 Airway entered and the following bronchi were examined: RUL, RML, RLL, LUL and LLL.   Bronchoscope removed.  , Patient placed back on 100% FiO2 at conclusion of procedure.    Evaluation BP 117/83  Pulse 110  Temp(Src) 98 F (36.7 C) (Oral)  Resp 23  Ht 6\' 2"  (1.88 m)  Wt 150 lb 5.7 oz (68.2 kg)  BMI 19.30 kg/m2  SpO2 99% Breath Sounds:Diminished and Rhonch O2 sats: stable throughout Patient's Current Condition: stable Specimens:  Sent purulent fluid Complications: No apparent complications Patient did tolerate procedure well. Patient has trach so bronchoscopy was done via trach tube.  Clearance Coots 03/24/2012, 1:57 PM

## 2012-03-24 NOTE — Progress Notes (Signed)
Patient on trach collar at 35% O2 (8 lpm) at 0700, resting in recliner.  Patient requested to be repositioned back in bed, and was assisted back to bed at 0800.  Patient was noted to be tachypneic, respiratory rate of 38, and oxygen saturation at 87-88% around 0840.  Trach collar 02 was increased to 40% (10 lpm) and patient was suctioned with minimal white secretions.  Respiratory rate and oxygen saturation was not improved after interventions.  RT notified at 0910 and patient was placed back on ventilator by RT at 0920.

## 2012-03-25 ENCOUNTER — Inpatient Hospital Stay (HOSPITAL_COMMUNITY): Payer: Medicare Other

## 2012-03-25 LAB — CBC
MCHC: 32 g/dL (ref 30.0–36.0)
Platelets: 379 10*3/uL (ref 150–400)
RDW: 14.3 % (ref 11.5–15.5)
WBC: 19.1 10*3/uL — ABNORMAL HIGH (ref 4.0–10.5)

## 2012-03-25 LAB — COMPREHENSIVE METABOLIC PANEL
ALT: 17 U/L (ref 0–53)
AST: 13 U/L (ref 0–37)
Albumin: 2.2 g/dL — ABNORMAL LOW (ref 3.5–5.2)
Alkaline Phosphatase: 72 U/L (ref 39–117)
BUN: 48 mg/dL — ABNORMAL HIGH (ref 6–23)
Potassium: 4 mEq/L (ref 3.5–5.1)
Sodium: 145 mEq/L (ref 135–145)
Total Protein: 7.6 g/dL (ref 6.0–8.3)

## 2012-03-25 LAB — OCCULT BLOOD X 1 CARD TO LAB, STOOL: Fecal Occult Bld: POSITIVE

## 2012-03-25 MED ORDER — PANTOPRAZOLE SODIUM 40 MG PO PACK
40.0000 mg | PACK | Freq: Every day | ORAL | Status: DC
  Administered 2012-03-25 – 2012-04-19 (×24): 40 mg
  Filled 2012-03-25 (×32): qty 20

## 2012-03-25 NOTE — Progress Notes (Signed)
Name: Michael Ellison MRN: [id number] DOB: [birth year]    LOS: 30  PULMONARY / CRITICAL CARE MEDICINE  Pt Profile:   66 y/o male smoker admitted to [facility] 02/24/2012 with n/v and abdominal pain due to SBO due to adhesions.  Had ex lap with SB resection 5/10.  Developed delirium post-op.  Transferred to [facility] 5/14 for respiratory failure requiring intubation.  Developed abdominal fascial dehiscence and taken back to OR 5/16.  Failed extubation, and required tracheostomy 5/22. PMHx ETOH, HTN, Prostate cancer   Interval hx:  5/16: OR for fascial dehiscence repair- closed with wound vac 5/18- failed weaning 5/19- extubated 5/21- re-intubated for hypercarbia 5/22-improved co2 and neurostatus, pos almost 2 liters 5/22 SVT>>resolved spontaneously 5/30 -off vent for 24 hours, planned Panda placement 6/1> up in chair, improved. 6/3- desat, increased WOB, vent 6/3- leak trach, improved with extra long 6/5: FEES eval: failed, still showing evidence of aspiration.   Subjective: Back on vent . Sleeping now. No reported new issues.  Vital Signs: Temp:  [98.3 F (36.8 C)-99.5 F (37.5 C)] 98.3 F (36.8 C) (06/08 0812) Pulse Rate:  [102-130] 102  (06/08 0817) Resp:  [16-32] 26  (06/08 0817) BP: (99-117)/(72-96) 115/82 mmHg (06/08 0817) SpO2:  [95 %-100 %] 99 % (06/08 0817) FiO2 (%):  [40 %] 40 % (06/08 0817) Weight:  [69 kg (152 lb 1.9 oz)] 69 kg (152 lb 1.9 oz) (06/08 0448)  Physical Examination: General - sleeping, didn't wake to voice or light touch HEENT - trach site clean Cardiac - s1s2 regular, no murmur Chest - resps even non labored, back on vent, scattered rhonchi Abd - midline open wound with wound vac in place, +bs Ext - no edema Neuro -   CBC  Lab 03/25/12 0347 03/24/12 0410 03/23/12 0410  HGB 12.0* 12.4* 12.6*  HCT 37.5* 40.0 38.9*  WBC 19.1* 10.4 13.2*  PLT 379 402* 440*    BMET  Lab 03/25/12 0347 03/24/12 0410 03/23/12 0410 03/22/12 0526 03/21/12 0405  NA 145 147*  146* 145 144  K 4.0 3.9 -- -- --  CL 111 112 109 111 110  CO2 24 24 22 25 25   GLUCOSE 148* 113* 66* 83 107*  BUN 48* 42* 31* 24* 25*  CREATININE 1.26 0.97 1.01 0.86 0.84  CALCIUM 8.6 9.0 9.2 8.9 8.5  MG -- -- -- -- 2.0  PHOS -- -- -- -- 2.9     Lab Results  Component Value Date   ALT 17 03/25/2012   AST 13 03/25/2012   ALKPHOS 72 03/25/2012   BILITOT 0.4 03/25/2012    Intake/Output Summary (Last 24 hours) at 03/25/12 0947 Last data filed at 03/25/12 0900  Gross per 24 hour  Intake   1743 ml  Output    850 ml  Net    893 ml     Medications reviewed   ASSESSMENT AND PLAN  PULMONARY ETT:  5/14>>>5/19 >>> 5/21 Trach (DF) 5/22>> 5/14 CT chest>> b/l effusions and basilar ATX/consolidation, severe bullous lung disease with emphysema in upper lobes b/l. 6/7 PCXR: no sig change in bibasilar volume loss and atx. 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  Failed extubation, and s/p trach 5/22. Xtra long 6 cuffed 6/3>>>  Had to go on vent this am after being off vent all night. Think deconditioning after prolonged critical illness is the major barrier to him coming of the vent 24/7, not  sure how much his RLL volume loss is contributing.  Plan: -He has continue to fail, mucous plug, ATX on pcxr: destatration, RT just suctioned thick secretions = Bronch -cont scheduled BDs, will add ICS -cont vibravest  -FOB to see if we can recruit him.  NO further Trach collar until am , pending bronch result -Cultures pending AM 6/8  CARDIOVASCULAR left [place] CVL 5/14>>> 5/31 Rt femoral aline 5/14>>>out  HTN, Edema, Intermittent tachycardia. Actually think he may be vascularly dry  Plan: To 1/2 NS Chem in am   RENAL  Hypernatremia Plan: -replace free water  -Hold lasix,, hemoconcetrated  GASTROINTESTINAL  5/09 CT abd/pelvis>>SBO, RLQ transition point, small Rt inguinal hernia,  diverticulosis 5/14 CT abd/pelvis>>ileus ABD Wound Vac 5/16>>  SBO s/p lap with SB resection 5/10 complicated by post-op ileus and fascial dehiscence, and return to OR 5/16. Plan: -post-op care per CCS, appreciate wound care  Dysphagia w/ evidence of penetration and probable aspiration observed on FEES completed 6/5.  P: -resume TF after bronch  HEMATOLOGIC  Anemia of critical illness.  Plan: -transfuse for Hb < 7 -Wbc in am   INFECTIOUS  Cultures: BCX2 5/14 >>1/2 gram neg rod>>> 1/2 Klebsiella  UC 5/14 >>Enterococcus (sensitive to ampicillin) Sputum 5/14>> Klebsiella pneumoniae (pan sensitive) Wound 5/14>>Klebsiella Pneumoniae (pan sensitive) & Enterobacter Cloacae (resistant to ancef, cefoxitin) BC X2 5/20>>>ng Sputum 5/22>>>klebs, enterobacter  Antibiotics: vanc 5/14 >> 5/17 Zosyn 5/13 >>5/28  Wound infection, aspiration pneumonia, UTI. Plan: -monitor off abx  -cxr in am  -If bronch will BAL  ENDOCRINE CBG (last 3)  Hyperglycemia., hypoglycemic on 6/6 Plan: -cont cbgs -add dextrose to IVFs  NEUROLOGIC  Acute metabolic encephalopathy 2nd to sepsis, respiratory failure, post-op pain, and ETOH.  Much improved.  Intolerant of lorazepam (does okay with midazolam). Plan: -PRN fentanyl for pain  -limit benzo's and haldol -pt/ot when ok with CCS  -To chair daily as tolerated  Depression / Anxiety Plan: -began lexapro  5/31, hx of depression -was not on meds PTA (had stopped taking), ETOH abuse   DISPO - has no LTAC benefits, per social work and family   Waymon Budge, 03/25/2012  9:47 AM

## 2012-03-25 NOTE — Progress Notes (Signed)
[place] chnaged

## 2012-03-25 NOTE — Progress Notes (Signed)
[facility] Progress Note 23 Days Post-Op  Subjective: Patient not feeling so great this morning.  Had bronchoscopy yesterday and has been under the weather since then.  No acute distress, but just does not seem happy.  Shaking head like not doing well from time I cam into the room  Objective: Vital signs in last 24 hours: Temp:  [98 F (36.7 C)-99.5 F (37.5 C)] 98.3 F (36.8 C) (06/08 0448) Pulse Rate:  [102-130] 119  (06/08 0404) Resp:  [16-43] 27  (06/08 0404) BP: (99-123)/(72-96) 99/78 mmHg (06/08 0404) SpO2:  [87 %-99 %] 98 % (06/08 0404) FiO2 (%):  [35 %-40 %] 40 % (06/08 0404) Weight:  [69 kg (152 lb 1.9 oz)] 69 kg (152 lb 1.9 oz) (06/08 0448) Last BM Date: 03/23/12  Intake/Output from previous day: 06/07 0701 - 06/08 0700 In: 953 [I.V.:113; NG/GT:290; IV Piggyback:550] Out: 750 [Urine:750] Intake/Output this shift:    General: Mild to moderate distress.  Does not look very ill acutely.  Lungs: Clear to auscultation.  Oxygen saturations 98% on the ventilator.  CXR today.shows possibly more RLL infiltrate and a suggestion of possible right PTX (may be scapular border as an illusion)  Abd: Soft, good bowel sounds, seems tender on the right in the lower part.  Had TF like bowel movement this morning.  Extremities: No changes  Neuro: Intact.  Awake and alert, and oriented.  Lab Results:  @LABLAST2 (wbc:2,hgb:2,hct:2,plt:2) BMET  Basename 03/25/12 0347 03/24/12 0410  NA 145 147*  K 4.0 3.9  CL 111 112  CO2 24 24  GLUCOSE 148* 113*  BUN 48* 42*  CREATININE 1.26 0.97  CALCIUM 8.6 9.0   PT/INR No results found for this basename: LABPROT:2,INR:2 in the last 72 hours ABG No results found for this basename: PHART:2,PCO2:2,PO2:2,HCO3:2 in the last 72 hours  Studies/Results: Dg Abd 1 View  03/23/2012  *RADIOLOGY REPORT*  Clinical Data: Feeding tube placement  ABDOMEN - 1 VIEW  Comparison: 03/17/2012  Findings: Single image demonstrates presence of a feeding tube beyond  pylorus in the distal second portion of the duodenum, with opacification of the second portion of duodenum by contrast.  IMPRESSION: Tip of feeding tube is within the distal second portion of the duodenum.  Original Report Authenticated By: Lollie Marrow, M.D.   Dg Chest Port 1 View  03/24/2012  *RADIOLOGY REPORT*  Clinical Data: Status post bronchoscopy  PORTABLE CHEST - 1 VIEW  Comparison: 03/24/2012  Findings: Tracheostomy tube tip is above the carina.  There is a feeding tube with tip below the field of view.  Bilateral lower lobe airspace opacities are unchanged from previous exam.  Emphysematous changes with biapical bullae are noted.  IMPRESSION:  1.  No change in bibasilar opacities.  Original Report Authenticated By: Rosealee Albee, M.D.   Dg Chest Port 1 View  03/24/2012  *RADIOLOGY REPORT*  Clinical Data: Tracheostomy tube position.  PORTABLE CHEST - 1 VIEW  Comparison: 03/23/2012.  Findings: The tracheostomy tube is in good position, unchanged. There is a new feeding tube coursing down the esophagus and into the stomach.  Persistent bibasilar opacities.  No edema or effusion.  IMPRESSION:  1.  Interval placement of a feeding tube. 2.  Persistent bibasilar opacities.  Original Report Authenticated By: P. Loralie Champagne, M.D.   Dg Naso G Tube Plc W/fl-no Rad  03/23/2012  CLINICAL DATA: panda placement   NASO G TUBE PLACEMENT WITH FLUORO  Fluoroscopy was utilized by the requesting physician.  No radiographic  interpretation.      Anti-infectives: Anti-infectives     Start     Dose/Rate Route Frequency Ordered Stop   03/25/12 0600   vancomycin (VANCOCIN) 750 mg in sodium chloride 0.9 % 150 mL IVPB        750 mg 150 mL/hr over 60 Minutes Intravenous Every 12 hours 03/24/12 1423     03/24/12 1600   vancomycin (VANCOCIN) 1,500 mg in sodium chloride 0.9 % 500 mL IVPB        1,500 mg 250 mL/hr over 120 Minutes Intravenous  Once 03/24/12 1423 03/24/12 1856   03/24/12 1530   cefTAZidime  (FORTAZ) 1 g in dextrose 5 % 50 mL IVPB        1 g 100 mL/hr over 30 Minutes Intravenous Every 8 hours 03/24/12 1423     03/24/12 1400   vancomycin (VANCOCIN) IVPB 1000 mg/200 mL premix  Status:  Discontinued        1,000 mg 200 mL/hr over 60 Minutes Intravenous Every 12 hours 03/24/12 1353 03/24/12 1400   03/03/12 1800   vancomycin (VANCOCIN) 1,750 mg in sodium chloride 0.9 % 500 mL IVPB  Status:  Discontinued        1,750 mg 250 mL/hr over 120 Minutes Intravenous Every 12 hours 03/03/12 0628 03/03/12 0918   02/29/12 1900   micafungin (MYCAMINE) 100 mg in sodium chloride 0.9 % 100 mL IVPB  Status:  Discontinued        100 mg 100 mL/hr over 1 Hours Intravenous Every 24 hours 02/29/12 1754 03/01/12 0933   02/29/12 1730   vancomycin (VANCOCIN) 1,250 mg in sodium chloride 0.9 % 250 mL IVPB  Status:  Discontinued        1,250 mg 166.7 mL/hr over 90 Minutes Intravenous Every 12 hours 02/29/12 1644 03/03/12 0628   02/29/12 1715   vancomycin (VANCOCIN) IVPB 1000 mg/200 mL premix  Status:  Discontinued        1,000 mg 200 mL/hr over 60 Minutes Intravenous  Once 02/29/12 1637 02/29/12 1647   02/29/12 1645   piperacillin-tazobactam (ZOSYN) IVPB 3.375 g  Status:  Discontinued        3.375 g 100 mL/hr over 30 Minutes Intravenous  Once 02/29/12 1637 02/29/12 1641   02/28/12 1800   piperacillin-tazobactam (ZOSYN) IVPB 3.375 g  Status:  Discontinued        3.375 g 12.5 mL/hr over 240 Minutes Intravenous 4 times per day 02/28/12 1454 02/28/12 1510   02/28/12 1800   piperacillin-tazobactam (ZOSYN) IVPB 3.375 g  Status:  Discontinued        3.375 g 12.5 mL/hr over 240 Minutes Intravenous Every 8 hours 02/28/12 1510 03/14/12 1700   02/25/12 1109   dextrose 5 % with cefOXitin (MEFOXIN) ADS Med     Comments: HOLLIE, CHRISTINA: cabinet override         02/25/12 1109 02/25/12 2314   02/25/12 1100   cefOXitin (MEFOXIN) 1 g in dextrose 5 % 50 mL IVPB  Status:  Discontinued        1 g 100 mL/hr over  30 Minutes Intravenous 60 min pre-op 02/25/12 1100 02/25/12 1542          Assessment/Plan: s/p Procedure(s): DEBRIDEMENT CLOSURE/ABDOMINAL WOUND Continue vancomycin. Because there seems to be a source of increased WBC in the chest, will not get CT abdomen.  Do not feel as though this is a source of infection. CPM otherwise.  Will look at wound again next week.  LOS: 30  days   Marta Lamas. [name], MD, FACS [phone] [facility] 03/25/2012

## 2012-03-26 NOTE — Progress Notes (Signed)
Upon entering room it was noted that the tape securing Panda to nose had come off and that the tube itself had come out of nose 3-4 inches.  Gently advanced tube back into right nare and checked placement by auscultation by stethoscope.  Pt is not showing any signs of distress, no coughing, no sob to indication of aspiration.  MD was notified of above and is satisfied with correct placement.  Panda tube was secured and TF was restarted with no adverse effects.  Will continue to monitor for any change in pt's status.

## 2012-03-26 NOTE — Progress Notes (Signed)
Name: Michael Ellison MRN: [id number] DOB: December 19, 1945    LOS: 31  PULMONARY / CRITICAL CARE MEDICINE  Pt Profile:   66 y/o male smoker admitted to [facility] 02/24/2012 with n/v and abdominal pain due to SBO due to adhesions.  Had ex lap with SB resection 5/10.  Developed delirium post-op.  Transferred to [facility] 5/14 for respiratory failure requiring intubation.  Developed abdominal fascial dehiscence and taken back to OR 5/16.  Failed extubation, and required tracheostomy 5/22. PMHx ETOH, HTN, Prostate cancer   Interval hx:  5/16: OR for fascial dehiscence repair- closed with wound vac 5/18- failed weaning 5/19- extubated 5/21- re-intubated for hypercarbia 5/22-improved co2 and neurostatus, pos almost 2 liters 5/22 SVT>>resolved spontaneously 5/30 -off vent for 24 hours, planned Panda placement 6/1> up in chair, improved. 6/3- desat, increased WOB, vent 6/3- leak trach, improved with extra long 6/5: FEES eval: failed, still showing evidence of aspiration.   Subjective: Back on vent . Sleeping now. No reported new issues. Dr Arita Miss surgical f/u note appreciated. Persistent reports of abdominal pain concerning for long term prognosis.  Vital Signs: Temp:  [98 F (36.7 C)-98.8 F (37.1 C)] 98.3 F (36.8 C) (06/09 0800) Pulse Rate:  [99-109] 102  (06/09 0800) Resp:  [19-28] 23  (06/09 0800) BP: (109-124)/(79-87) 120/81 mmHg (06/09 0800) SpO2:  [98 %-100 %] 99 % (06/09 0800) FiO2 (%):  [40 %] 40 % (06/09 0800) Weight:  [70 kg (154 lb 5.2 oz)] 70 kg (154 lb 5.2 oz) (06/09 0420)  Physical Examination: General - sleeping again today, didn't wake to voice or light touch HEENT - trach site clean Cardiac - s1s2 regular, no murmur Chest - resps even non labored, back on vent, scattered rhonchi Abd - midline open wound with wound vac in place, +bs Ext - no edema Neuro -   CBC  Lab 03/25/12 0347 03/24/12 0410 03/23/12 0410  HGB 12.0* 12.4* 12.6*  HCT 37.5* 40.0 38.9*  WBC 19.1* 10.4  13.2*  PLT 379 402* 440*    BMET  Lab 03/25/12 0347 03/24/12 0410 03/23/12 0410 03/22/12 0526 03/21/12 0405  NA 145 147* 146* 145 144  K 4.0 3.9 -- -- --  CL 111 112 109 111 110  CO2 24 24 22 25 25   GLUCOSE 148* 113* 66* 83 107*  BUN 48* 42* 31* 24* 25*  CREATININE 1.26 0.97 1.01 0.86 0.84  CALCIUM 8.6 9.0 9.2 8.9 8.5  MG -- -- -- -- 2.0  PHOS -- -- -- -- 2.9     Lab Results  Component Value Date   ALT 17 03/25/2012   AST 13 03/25/2012   ALKPHOS 72 03/25/2012   BILITOT 0.4 03/25/2012    Intake/Output Summary (Last 24 hours) at 03/26/12 0917 Last data filed at 03/26/12 0802  Gross per 24 hour  Intake   3143 ml  Output    900 ml  Net   2243 ml     Medications reviewed   ASSESSMENT AND PLAN  PULMONARY ETT:  5/14>>>5/19 >>> 5/21 Trach (DF) 5/22>> 5/14 CT chest>> b/l effusions and basilar ATX/consolidation, severe bullous lung disease with emphysema in upper lobes b/l. 6/7 PCXR: no sig change in bibasilar volume loss and atx. 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  Failed extubation, and s/p trach 5/22. Xtra long 6 cuffed 6/3>>>  Had to go on vent this am after being off vent all night. Think  deconditioning after prolonged critical illness is the major barrier to him coming of the vent 24/7, not sure how much his RLL volume loss is contributing.  Plan: -He has continue to fail, mucous plug, ATX on pcxr: destatration, RT just suctioned thick secretions = Bronch -cont scheduled BDs, will add ICS -cont vibravest  -FOB to see if we can recruit him.  NO further Trach collar until am , pending bronch result -Cultures pending AM 6/8- pending -Repeat CXR  CARDIOVASCULAR left [place] CVL 5/14>>> 5/31 Rt femoral aline 5/14>>>out  HTN, Edema, Intermittent tachycardia. Actually think he may be vascularly dry  Plan: To 1/2 NS Chem in am    RENAL  Hypernatremia Plan: -replace free water  -Hold lasix,, hemoconcentrated. Positive I&O -Azotemia  GASTROINTESTINAL  5/09 CT abd/pelvis>>SBO, RLQ transition point, small Rt inguinal hernia, diverticulosis 5/14 CT abd/pelvis>>ileus ABD Wound Vac 5/16>>  SBO s/p lap with SB resection 5/10 complicated by post-op ileus and fascial dehiscence, and return to OR 5/16. Chronic pain. Plan: -post-op care per [facility], appreciate wound care  Dysphagia w/ evidence of penetration and probable aspiration observed on FEES completed 6/5.  P: -resume TF after bronch  HEMATOLOGIC  Anemia of critical illness.  Plan: -transfuse for Hb < 7 -Wbc in am   INFECTIOUS  Cultures: BCX2 5/14 >>1/2 gram neg rod>>> 1/2 Klebsiella  UC 5/14 >>Enterococcus (sensitive to ampicillin) Sputum 5/14>> Klebsiella pneumoniae (pan sensitive) Wound 5/14>>Klebsiella Pneumoniae (pan sensitive) & Enterobacter Cloacae (resistant to ancef, cefoxitin) BC X2 5/20>>>ng Sputum 5/22>>>klebs, enterobacter Sputum 6/7>  Antibiotics: vanc 5/14 >> 5/17 Zosyn 5/13 >>5/28  Wound infection, aspiration pneumonia, UTI. Plan: -monitor off abx  -cxr in am  -If bronch will BAL  ENDOCRINE CBG (last 3)  Hyperglycemia., hypoglycemic on 6/6 Plan: -cont cbgs -add dextrose to IVFs  NEUROLOGIC  Acute metabolic encephalopathy 2nd to sepsis, respiratory failure, post-op pain, and ETOH.  Much improved.  Intolerant of lorazepam (does okay with midazolam). Plan: -PRN fentanyl for pain  -limit benzo's and haldol -pt/ot when ok with [facility]  -To chair daily as tolerated  Depression / Anxiety Plan: -began lexapro  5/31, hx of depression -was not on meds PTA (had stopped taking), ETOH abuse   DISPO - has no LTAC benefits, per social work and family   Waymon Budge, 03/26/2012  9:17 AM

## 2012-03-26 NOTE — Progress Notes (Signed)
Patient ID: ORA MCNATT, male   DOB: 11-Nov-1945, 66 y.o.   MRN: [id number] [facility] Progress Note 24 Days Post-Op  Subjective: Pt continues to have increased secretions and remains on ventilator.  He is also complaining of increased abdominal pain.  He is tolerating tube feeds via Panda.    Objective: Vital signs in last 24 hours: Temp:  [98 F (36.7 C)-98.8 F (37.1 C)] 98.2 F (36.8 C) (06/09 0420) Pulse Rate:  [99-109] 99  (06/09 0741) Resp:  [19-28] 21  (06/09 0741) BP: (109-124)/(79-87) 112/79 mmHg (06/09 0509) SpO2:  [98 %-100 %] 99 % (06/09 0746) FiO2 (%):  [40 %] 40 % (06/09 0746) Weight:  [154 lb 5.2 oz (70 kg)] 154 lb 5.2 oz (70 kg) (06/09 0420) Last BM Date: 03/25/12  Intake/Output from previous day: 06/08 0701 - 06/09 0700 In: 1503 [I.V.:93; NG/GT:1060; IV Piggyback:350] Out: 850 [Urine:850] Intake/Output this shift:   General: Mild distress  Lungs: slightly junky  Abd: Soft, seems tender on left.  Not distended.    Extremities: No changes  Neuro: Intact.  Awake and alert, and oriented.  Lab Results:   BMET  Basename 03/25/12 0347 03/24/12 0410  NA 145 147*  K 4.0 3.9  CL 111 112  CO2 24 24  GLUCOSE 148* 113*  BUN 48* 42*  CREATININE 1.26 0.97  CALCIUM 8.6 9.0   PT/INR No results found for this basename: LABPROT:2,INR:2 in the last 72 hours ABG No results found for this basename: PHART:2,PCO2:2,PO2:2,HCO3:2 in the last 72 hours  Studies/Results: Dg Chest Port 1 View  03/25/2012  *RADIOLOGY REPORT*  Clinical Data: Check endotracheal tube.  PORTABLE CHEST - 1 VIEW  Comparison: 03/24/2012  Findings: Tracheostomy tube is unchanged in position.  A feeding tube extends into the abdomen.  Again noted are large blebs or bulla formations in the upper lungs.  There are patchy densities at the right lung base which have not significantly changed.  Cannot exclude right pleural fluid.  Heart size is grossly stable.  IMPRESSION: Persistent densities in the lower  lungs, right side greater left. Atelectasis or airspace disease cannot be excluded.  In addition, there may be small effusions.  Evidence for bulla changes or large blebs in the upper lungs.  Original Report Authenticated By: Richarda Overlie, M.D.   Dg Chest Port 1 View  03/24/2012  *RADIOLOGY REPORT*  Clinical Data: Status post bronchoscopy  PORTABLE CHEST - 1 VIEW  Comparison: 03/24/2012  Findings: Tracheostomy tube tip is above the carina.  There is a feeding tube with tip below the field of view.  Bilateral lower lobe airspace opacities are unchanged from previous exam.  Emphysematous changes with biapical bullae are noted.  IMPRESSION:  1.  No change in bibasilar opacities.  Original Report Authenticated By: Rosealee Albee, M.D.    Anti-infectives: Anti-infectives     Start     Dose/Rate Route Frequency Ordered Stop   03/25/12 0600   vancomycin (VANCOCIN) 750 mg in sodium chloride 0.9 % 150 mL IVPB        750 mg 150 mL/hr over 60 Minutes Intravenous Every 12 hours 03/24/12 1423     03/24/12 1600   vancomycin (VANCOCIN) 1,500 mg in sodium chloride 0.9 % 500 mL IVPB        1,500 mg 250 mL/hr over 120 Minutes Intravenous  Once 03/24/12 1423 03/24/12 1856   03/24/12 1530   cefTAZidime (FORTAZ) 1 g in dextrose 5 % 50 mL IVPB  1 g 100 mL/hr over 30 Minutes Intravenous Every 8 hours 03/24/12 1423     03/24/12 1400   vancomycin (VANCOCIN) IVPB 1000 mg/200 mL premix  Status:  Discontinued        1,000 mg 200 mL/hr over 60 Minutes Intravenous Every 12 hours 03/24/12 1353 03/24/12 1400   03/03/12 1800   vancomycin (VANCOCIN) 1,750 mg in sodium chloride 0.9 % 500 mL IVPB  Status:  Discontinued        1,750 mg 250 mL/hr over 120 Minutes Intravenous Every 12 hours 03/03/12 0628 03/03/12 0918   02/29/12 1900   micafungin (MYCAMINE) 100 mg in sodium chloride 0.9 % 100 mL IVPB  Status:  Discontinued        100 mg 100 mL/hr over 1 Hours Intravenous Every 24 hours 02/29/12 1754 03/01/12 0933    02/29/12 1730   vancomycin (VANCOCIN) 1,250 mg in sodium chloride 0.9 % 250 mL IVPB  Status:  Discontinued        1,250 mg 166.7 mL/hr over 90 Minutes Intravenous Every 12 hours 02/29/12 1644 03/03/12 0628   02/29/12 1715   vancomycin (VANCOCIN) IVPB 1000 mg/200 mL premix  Status:  Discontinued        1,000 mg 200 mL/hr over 60 Minutes Intravenous  Once 02/29/12 1637 02/29/12 1647   02/29/12 1645   piperacillin-tazobactam (ZOSYN) IVPB 3.375 g  Status:  Discontinued        3.375 g 100 mL/hr over 30 Minutes Intravenous  Once 02/29/12 1637 02/29/12 1641   02/28/12 1800   piperacillin-tazobactam (ZOSYN) IVPB 3.375 g  Status:  Discontinued        3.375 g 12.5 mL/hr over 240 Minutes Intravenous 4 times per day 02/28/12 1454 02/28/12 1510   02/28/12 1800   piperacillin-tazobactam (ZOSYN) IVPB 3.375 g  Status:  Discontinued        3.375 g 12.5 mL/hr over 240 Minutes Intravenous Every 8 hours 02/28/12 1510 03/14/12 1700   02/25/12 1109   dextrose 5 % with cefOXitin (MEFOXIN) ADS Med     Comments: HOLLIE, CHRISTINA: cabinet override         02/25/12 1109 02/25/12 2314   02/25/12 1100   cefOXitin (MEFOXIN) 1 g in dextrose 5 % 50 mL IVPB  Status:  Discontinued        1 g 100 mL/hr over 30 Minutes Intravenous 60 min pre-op 02/25/12 1100 02/25/12 1542          Assessment/Plan: s/p Procedure(s): DEBRIDEMENT CLOSURE/ABDOMINAL WOUND Pt certainly seems to have increased abdominal complaints.  Unclear why.  Does seem to have source of leukocytosis with lungs.  Will recheck CBC today.  If WBCs continue to rise, would order CT scan of abdomen.  He is further out from surgery and is less likely to have an abscess at this point.    LOS: 31 days    03/26/2012

## 2012-03-27 ENCOUNTER — Inpatient Hospital Stay (HOSPITAL_COMMUNITY): Payer: Medicare Other

## 2012-03-27 DIAGNOSIS — I498 Other specified cardiac arrhythmias: Secondary | ICD-10-CM

## 2012-03-27 LAB — CBC
HCT: 32.3 % — ABNORMAL LOW (ref 39.0–52.0)
Hemoglobin: 10.1 g/dL — ABNORMAL LOW (ref 13.0–17.0)
MCH: 27.6 pg (ref 26.0–34.0)
MCHC: 31.3 g/dL (ref 30.0–36.0)
MCV: 88.3 fL (ref 78.0–100.0)
Platelets: 251 10*3/uL (ref 150–400)
RBC: 3.66 MIL/uL — ABNORMAL LOW (ref 4.22–5.81)
RDW: 14.6 % (ref 11.5–15.5)
WBC: 9.8 10*3/uL (ref 4.0–10.5)

## 2012-03-27 MED ORDER — VANCOMYCIN HCL 1000 MG IV SOLR
1250.0000 mg | Freq: Two times a day (BID) | INTRAVENOUS | Status: DC
  Administered 2012-03-27 – 2012-03-28 (×2): 1250 mg via INTRAVENOUS
  Filled 2012-03-27 (×3): qty 1250

## 2012-03-27 NOTE — Progress Notes (Signed)
Patient ID: Michael Ellison, male   DOB: 1946-03-15, 66 y.o.   MRN: [id number] Patient ID: Michael Ellison, male   DOB: 1946/04/11, 66 y.o.   MRN: [id number] [facility] Progress Note 25 Days Post-Op  Subjective: Pt continues to have increased secretions and remains on ventilator.  Reports abd pain better then yesteray.  He is tolerating tube feeds via Panda.    Objective: Vital signs in last 24 hours: Temp:  [98.2 F (36.8 C)-98.8 F (37.1 C)] 98.6 F (37 C) (06/10 0805) Pulse Rate:  [82-99] 82  (06/10 0805) Resp:  [16-25] 19  (06/10 0805) BP: (101-125)/(77-95) 114/80 mmHg (06/10 0805) SpO2:  [99 %-100 %] 100 % (06/10 0805) FiO2 (%):  [40 %] 40 % (06/10 0805) Weight:  [151 lb 0.2 oz (68.5 kg)] 151 lb 0.2 oz (68.5 kg) (06/10 0500) Last BM Date: 03/26/12  Intake/Output from previous day: 06/09 0701 - 06/10 0700 In: 2883 [I.V.:203; GE/XB:2841; IV Piggyback:300] Out: 750 [Urine:750] Intake/Output this shift:   General: No distress  Lungs: coarse sounding  Abd: Soft, seems tender on left and around retention sutures.  Vac in place.  Not distended.    Extremities: No changes  Neuro: Intact.  Awake and alert, and oriented.  Lab Results:   BMET  Basename 03/25/12 0347  NA 145  K 4.0  CL 111  CO2 24  GLUCOSE 148*  BUN 48*  CREATININE 1.26  CALCIUM 8.6   PT/INR No results found for this basename: LABPROT:2,INR:2 in the last 72 hours ABG No results found for this basename: PHART:2,PCO2:2,PO2:2,HCO3:2 in the last 72 hours  Studies/Results: Dg Chest Port 1 View  03/27/2012  *RADIOLOGY REPORT*  Clinical Data: Pneumonia.  PORTABLE CHEST - 1 VIEW  Comparison: Chest x-ray 03/25/2012.  Findings: A tracheostomy tube is in place with tip 4.7 cm above the carina. A feeding tube is seen extending into the abdomen, however, the tip of the feeding tube extends below the lower margin of the image.  Lung volumes remain low, with persistent elevation of the right hemidiaphragm.  There are  patchy predominantly linear opacities throughout the mid and lower lungs bilaterally (right greater than left), which is favored to in large part reflect areas of atelectasis and/or scarring, however, superimposed airspace disease from aspiration or developing infection in the right base is not excluded.  Probable small right-sided pleural effusion.  No definite left pleural effusion.  No evidence of pulmonary edema. Heart size is within normal limits. The patient is rotated to the left on today's exam, resulting in distortion of the mediastinal contours and reduced diagnostic sensitivity and specificity for mediastinal pathology.  Atherosclerotic calcifications within the arch of the aorta.  IMPRESSION: 1.  Support apparatus, as above. 2.  Slight worsening aeration throughout the lung bases bilaterally (right greater than left), concerning for sequela of aspiration or developing infection. 3.  Probable small right-sided pleural effusion.  Original Report Authenticated By: Florencia Reasons, M.D.    Anti-infectives: Anti-infectives     Start     Dose/Rate Route Frequency Ordered Stop   03/27/12 1600   vancomycin (VANCOCIN) 1,250 mg in sodium chloride 0.9 % 250 mL IVPB        1,250 mg 166.7 mL/hr over 90 Minutes Intravenous Every 12 hours 03/27/12 0541     03/25/12 0600   vancomycin (VANCOCIN) 750 mg in sodium chloride 0.9 % 150 mL IVPB  Status:  Discontinued        750 mg 150 mL/hr over 60  Minutes Intravenous Every 12 hours 03/24/12 1423 03/27/12 0541   03/24/12 1600   vancomycin (VANCOCIN) 1,500 mg in sodium chloride 0.9 % 500 mL IVPB        1,500 mg 250 mL/hr over 120 Minutes Intravenous  Once 03/24/12 1423 03/24/12 1856   03/24/12 1530   cefTAZidime (FORTAZ) 1 g in dextrose 5 % 50 mL IVPB        1 g 100 mL/hr over 30 Minutes Intravenous Every 8 hours 03/24/12 1423     03/24/12 1400   vancomycin (VANCOCIN) IVPB 1000 mg/200 mL premix  Status:  Discontinued        1,000 mg 200 mL/hr over 60  Minutes Intravenous Every 12 hours 03/24/12 1353 03/24/12 1400   03/03/12 1800   vancomycin (VANCOCIN) 1,750 mg in sodium chloride 0.9 % 500 mL IVPB  Status:  Discontinued        1,750 mg 250 mL/hr over 120 Minutes Intravenous Every 12 hours 03/03/12 0628 03/03/12 0918   02/29/12 1900   micafungin (MYCAMINE) 100 mg in sodium chloride 0.9 % 100 mL IVPB  Status:  Discontinued        100 mg 100 mL/hr over 1 Hours Intravenous Every 24 hours 02/29/12 1754 03/01/12 0933   02/29/12 1730   vancomycin (VANCOCIN) 1,250 mg in sodium chloride 0.9 % 250 mL IVPB  Status:  Discontinued        1,250 mg 166.7 mL/hr over 90 Minutes Intravenous Every 12 hours 02/29/12 1644 03/03/12 0628   02/29/12 1715   vancomycin (VANCOCIN) IVPB 1000 mg/200 mL premix  Status:  Discontinued        1,000 mg 200 mL/hr over 60 Minutes Intravenous  Once 02/29/12 1637 02/29/12 1647   02/29/12 1645   piperacillin-tazobactam (ZOSYN) IVPB 3.375 g  Status:  Discontinued        3.375 g 100 mL/hr over 30 Minutes Intravenous  Once 02/29/12 1637 02/29/12 1641   02/28/12 1800   piperacillin-tazobactam (ZOSYN) IVPB 3.375 g  Status:  Discontinued        3.375 g 12.5 mL/hr over 240 Minutes Intravenous 4 times per day 02/28/12 1454 02/28/12 1510   02/28/12 1800   piperacillin-tazobactam (ZOSYN) IVPB 3.375 g  Status:  Discontinued        3.375 g 12.5 mL/hr over 240 Minutes Intravenous Every 8 hours 02/28/12 1510 03/14/12 1700   02/25/12 1109   dextrose 5 % with cefOXitin (MEFOXIN) ADS Med     Comments: HOLLIE, CHRISTINA: cabinet override         02/25/12 1109 02/25/12 2314   02/25/12 1100   cefOXitin (MEFOXIN) 1 g in dextrose 5 % 50 mL IVPB  Status:  Discontinued        1 g 100 mL/hr over 30 Minutes Intravenous 60 min pre-op 02/25/12 1100 02/25/12 1542          Assessment/Plan: s/p Procedure(s): DEBRIDEMENT CLOSURE/ABDOMINAL WOUND Pts abd pain is better today but concerning with leukocytosis although this can be explained by  lungs, rechecking CBC today.  If WBCs continue to rise, would order CT scan of abdomen.  He is further out from surgery and is less likely to have an abscess at this point.    LOS: 32 days   Manish Ruggiero 9:57 AM 03/27/2012

## 2012-03-27 NOTE — Progress Notes (Signed)
ANTIBIOTIC CONSULT NOTE - FOLLOW UP  Pharmacy Consult for vancomycin Indication: pneumonia  Labs:  [facility] 03/25/12 0347  WBC 19.1*  HGB 12.0*  PLT 379  LABCREA --  CREATININE 1.26   Estimated Creatinine Clearance: 56.6 ml/min (by C-G formula based on Cr of 1.26).  Basename 03/27/12 0405  VANCOTROUGH 11.8  VANCOPEAK --  VANCORANDOM --  GENTTROUGH --  GENTPEAK --  GENTRANDOM --  TOBRATROUGH --  TOBRAPEAK --  TOBRARND --  AMIKACINPEAK --  AMIKACINTROU --  AMIKACIN --     Assessment: 66yo male subtherapeutic on vancomycin with initial dosing for PNA.  Goal of Therapy:  Vancomycin trough level 15-20 mcg/ml  Plan:  Will change vanc to 1250mg  IV Q12H for calculated trough of ~19 and continue to monitor.  Colleen Can PharmD BCPS 03/27/2012,5:42 AM

## 2012-03-27 NOTE — Progress Notes (Signed)
Physical Therapy Treatment Patient Details Name: Michael Ellison MRN: [id number] DOB: 10-06-1946 Today's Date: 03/27/2012 Time: 3086-5784 PT Time Calculation (min): 15 min  PT Assessment / Plan / Recommendation Comments on Treatment Session  Patient s/p SB resection with fascial dehiscence with VDRF with trach.  Patient with slower progress than anticipated and on vent.  Will continue PT.     Follow Up Recommendations  Skilled nursing facility;Supervision/Assistance - 24 hour    Barriers to Discharge  None      Equipment Recommendations  Defer to next venue    Recommendations for Other Services  None  Frequency Min 3X/week   Plan Discharge plan remains appropriate;Frequency remains appropriate    Precautions / Restrictions Precautions Precautions: Fall Precaution Comments: On vent Restrictions Weight Bearing Restrictions: No   Pertinent Vitals/Pain VSS, No pain    Mobility  Bed Mobility Bed Mobility: Rolling Right;Right Sidelying to Sit Rolling Right: 5: Supervision Right Sidelying to Sit: 4: Min assist;HOB flat;With rails Supine to Sit: Not tested (comment) Sitting - Scoot to Edge of Bed: 4: Min guard Sit to Supine: Not Tested (comment) Details for Bed Mobility Assistance: Needed a little help today to sit up secondary to c/o dizziness.  Transfers Transfers: Sit to Stand;Stand to [employer] Transfers Sit to Stand: 4: Min guard;With upper extremity assist;From bed Stand to Sit: 4: Min guard;With upper extremity assist;To chair/3-in-1 Stand Pivot Transfers: 4: Min assist Details for Transfer Assistance: No cues needed for hand placement.  Transfer only to chair as patient was back on vent today.  Steady with stand pivot transfer overall. Ambulation/Gait Ambulation/Gait Assistance: Not tested (comment) Stairs: No Wheelchair Mobility Wheelchair Mobility: No    Exercises Total Joint Exercises Ankle Circles/Pumps: AROM;Both;10 reps;Seated General Exercises -  Lower Extremity Long Arc Quad: AROM;Both;10 reps;Seated Hip Flexion/Marching: AROM;10 reps;Both;Seated    PT Goals Acute Rehab PT Goals PT Goal Formulation: With patient Time For Goal Achievement: 04/10/12 Potential to Achieve Goals: Good Pt will go Supine/Side to Sit: with modified independence PT Goal: Supine/Side to Sit - Progress: Goal set today Pt will Sit at Edge of Bed: Independently;6-10 min;with no upper extremity support PT Goal: Sit at Edge Of Bed - Progress: Goal set today Pt will go Sit to Stand: Independently;with upper extremity assist PT Goal: Sit to Stand - Progress: Goal set today Pt will go Stand to Sit: Independently;with upper extremity assist PT Goal: Stand to Sit - Progress: Goal set today Pt will Transfer Bed to Chair/Chair to Bed: with modified independence PT Transfer Goal: Bed to Chair/Chair to Bed - Progress: Goal set today Pt will Ambulate: 51 - 150 feet;with least restrictive assistive device;with supervision PT Goal: Ambulate - Progress: Goal set today  Visit Information  Last PT Received On: 03/27/12 Assistance Needed: +2    Subjective Data  Subjective: Patient on vent.     Cognition  Overall Cognitive Status: Appears within functional limits for tasks assessed/performed Difficult to assess due to: Tracheostomy Arousal/Alertness: Awake/alert Orientation Level: Appears intact for tasks assessed Behavior During Session: [facility] for tasks performed    Balance  Static Sitting Balance Static Sitting - Balance Support: No upper extremity supported;Feet supported Static Sitting - Level of Assistance: 6: Modified independent (Device/Increase time) Static Sitting - Comment/# of Minutes: 3 Static Standing Balance Static Standing - Balance Support: Bilateral upper extremity supported;During functional activity Static Standing - Level of Assistance: 4: Min assist  End of Session PT - End of Session Equipment Utilized During Treatment: Gait belt Activity  Tolerance: Patient tolerated treatment well Patient left: in chair;with call bell/phone within reach Nurse Communication: Mobility status    INGOLD,Toron Bowring 03/27/2012, 1:46 PM [facility] Acute Rehabilitation [phone] (pager)

## 2012-03-27 NOTE — Progress Notes (Signed)
-   CSW spoke with MD regarding pt disposition. MD states pt will need continued vent support and recommends vent SNF placement as pt does not have LTAC benefits.   -CSW spoke with pt daughters at length regarding pt disposition, rehabilitation, and detailing facility options including LTAC and vent SNF, and describing pt progression through levels of care, as determined by level of vent support required. Pt daughters understanding and agreeable to CSW initiating SNF search at this time, although distressed at the possibility of out of state placement. CSW discussed rehabilitation with pt only generally so as to minimize distress to pt at this time.   CSW will initiate vent SNF search and will continue to follow for d/c planning.   Baxter Flattery, MSW [phone]

## 2012-03-27 NOTE — Progress Notes (Signed)
Name: Michael Ellison MRN: [id number] DOB: May 21, 1946    LOS: 32  PULMONARY / CRITICAL CARE MEDICINE  Pt Profile:   66 y/o male smoker admitted to [facility] 02/24/2012 with n/v and abdominal pain due to SBO due to adhesions.  Had ex lap with SB resection 5/10.  Developed delirium post-op.  Transferred to [facility] 5/14 for respiratory failure requiring intubation.  Developed abdominal fascial dehiscence and taken back to OR 5/16.  Failed extubation, and required tracheostomy 5/22. PMHx ETOH, HTN, Prostate cancer  Interval hx:  5/16: OR for fascial dehiscence repair- closed with wound vac 5/18- failed weaning 5/19- extubated 5/21- re-intubated for hypercarbia 5/22-improved co2 and neurostatus, pos almost 2 liters 5/22 SVT>>resolved spontaneously 5/30 -off vent for 24 hours, planned Panda placement 6/1> up in chair, improved. 6/3- desat, increased WOB, vent 6/3- leak trach, improved with extra long 6/5: FEES eval: failed, still showing evidence of aspiration.   Subjective: Back on vent . Sleeping now. No reported new issues. Dr Michael Ellison surgical f/u note appreciated. Persistent reports of abdominal pain concerning for long term prognosis.  Vital Signs: Temp:  [98.2 F (36.8 C)-98.8 F (37.1 C)] 98.6 F (37 C) (06/10 0805) Pulse Rate:  [82-95] 94  (06/10 1110) Resp:  [16-27] 27  (06/10 1110) BP: (101-125)/(77-95) 124/89 mmHg (06/10 1110) SpO2:  [99 %-100 %] 99 % (06/10 1239) FiO2 (%):  [40 %] 40 % (06/10 1239) Weight:  [68.5 kg (151 lb 0.2 oz)] 68.5 kg (151 lb 0.2 oz) (06/10 0500)  Physical Examination: General - sleeping again today, didn't wake to voice or light touch HEENT - trach site clean Cardiac - s1s2 regular, no murmur Chest - resps even non labored, back on vent, scattered rhonchi Abd - midline open wound with wound vac in place, +bs Ext - no edema Neuro -   CBC  Lab 03/27/12 1050 03/25/12 0347 03/24/12 0410  HGB 10.1* 12.0* 12.4*  HCT 32.3* 37.5* 40.0  WBC 9.8 19.1*  10.4  PLT 251 379 402*    BMET  Lab 03/25/12 0347 03/24/12 0410 03/23/12 0410 03/22/12 0526 03/21/12 0405  NA 145 147* 146* 145 144  K 4.0 3.9 -- -- --  CL 111 112 109 111 110  CO2 24 24 22 25 25   GLUCOSE 148* 113* 66* 83 107*  BUN 48* 42* 31* 24* 25*  CREATININE 1.26 0.97 1.01 0.86 0.84  CALCIUM 8.6 9.0 9.2 8.9 8.5  MG -- -- -- -- 2.0  PHOS -- -- -- -- 2.9     Lab Results  Component Value Date   ALT 17 03/25/2012   AST 13 03/25/2012   ALKPHOS 72 03/25/2012   BILITOT 0.4 03/25/2012    Intake/Output Summary (Last 24 hours) at 03/27/12 1315 Last data filed at 03/27/12 1100  Gross per 24 hour  Intake   2563 ml  Output    500 ml  Net   2063 ml     Medications reviewed   ASSESSMENT AND PLAN  PULMONARY ETT:  5/14>>>5/19 >>> 5/21 Trach (DF) 5/22>> 5/14 CT chest>> b/l effusions and basilar ATX/consolidation, severe bullous lung disease with emphysema in upper lobes b/l. 6/7 PCXR: no sig change in bibasilar volume loss and atx. 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  Failed extubation, and s/p trach 5/22. Xtra long 6 cuffed 6/3>>>  Had to go on vent after being off vent all night. Think deconditioning after prolonged  critical illness is the major barrier to him coming of the vent 24/7, not sure how much his RLL volume loss is contributing.  Plan: -He has continue to fail, mucous plug, ATX on pcxr: destatration, RT just suctioned thick secretions -cont scheduled BDs, will continue ICS -cont vibravest  -FOB to see if we can recruit him.  -Will begin PS trials today. -Cultures pending AM 6/8- sputum with few GNR but otherwise no growth. -Repeat CXR noted, ?infiltrate with effusion on vanc and ceftaz.  CARDIOVASCULAR left Michael Ellison CVL 5/14>>> 5/31 Rt femoral aline 5/14>>>out  HTN, Edema, Intermittent tachycardia. Actually think he may be vascularly dry  Plan: To 1/2  NS Chem in am   RENAL  Hypernatremia Plan: -Continue to hold lasix but will attempt to keep even from here on out.  GASTROINTESTINAL  5/09 CT abd/pelvis>>SBO, RLQ transition point, small Rt inguinal hernia, diverticulosis 5/14 CT abd/pelvis>>ileus ABD Wound Vac 5/16>>  SBO s/p lap with SB resection 5/10 complicated by post-op ileus and fascial dehiscence, and return to OR 5/16. Chronic pain. Plan: -Post-op care per CCS, appreciate wound care  Dysphagia w/ evidence of penetration and probable aspiration observed on FEES completed 6/5.  P: -resume TF after bronch  HEMATOLOGIC  Anemia of critical illness.  Plan: -transfuse for Hb < 7 -Wbc in am   INFECTIOUS  Cultures: BCX2 5/14 >>1/2 gram neg rod>>> 1/2 Klebsiella  UC 5/14 >>Enterococcus (sensitive to ampicillin) Sputum 5/14>> Klebsiella pneumoniae (pan sensitive) Wound 5/14>>Klebsiella Pneumoniae (pan sensitive) & Enterobacter Cloacae (resistant to ancef, cefoxitin) BC X2 5/20>>>ng Sputum 5/22>>>klebs, enterobacter Sputum 6/7>  Antibiotics: vanc 5/14 >> 5/17 Zosyn 5/13 >>5/28 Vanc 6/9>>> Ceftaz 6/9>>>  Wound infection, aspiration pneumonia, UTI. Plan: -Monitor off abx  -CXR in am  -If bronch will BAL  ENDOCRINE CBG (last 3)  Hyperglycemia., hypoglycemic on 6/6 Plan: -Cont cbgs -Add dextrose to IVFs  NEUROLOGIC  Acute metabolic encephalopathy 2nd to sepsis, respiratory failure, post-op pain, and ETOH.  Much improved.  Intolerant of lorazepam (does okay with midazolam). Plan: -PRN fentanyl for pain  -limit benzo's and haldol -pt/ot when ok with CCS  -To chair daily as tolerated  Depression / Anxiety Plan: -Began lexapro  5/31, hx of depression -was not on meds PTA (had stopped taking), ETOH abuse   DISPO - has no LTAC benefits, per social work and family   Michael Ellison, 03/27/2012  1:15 PM

## 2012-03-27 NOTE — Progress Notes (Signed)
Attempted swallow therapy, pt on vent. Will return tomorrow. Harlon Ditty, MA CCC-SLP [phone]

## 2012-03-27 NOTE — Progress Notes (Signed)
Up in chair Some L abdominal tenderness F/U CBC with plan for possible CT A/P as above Patient examined and I agree with the assessment and plan  Violeta Gelinas, MD, MPH, FACS Pager: [phone]  03/27/2012 11:37 AM

## 2012-03-28 DIAGNOSIS — J189 Pneumonia, unspecified organism: Secondary | ICD-10-CM

## 2012-03-28 LAB — CULTURE, RESPIRATORY W GRAM STAIN: Special Requests: NORMAL

## 2012-03-28 LAB — BASIC METABOLIC PANEL
BUN: 17 mg/dL (ref 6–23)
CO2: 28 mEq/L (ref 19–32)
Calcium: 8.3 mg/dL — ABNORMAL LOW (ref 8.4–10.5)
Glucose, Bld: 115 mg/dL — ABNORMAL HIGH (ref 70–99)
Sodium: 144 mEq/L (ref 135–145)

## 2012-03-28 LAB — CBC
HCT: 32.9 % — ABNORMAL LOW (ref 39.0–52.0)
Hemoglobin: 10.3 g/dL — ABNORMAL LOW (ref 13.0–17.0)
MCH: 27.5 pg (ref 26.0–34.0)
MCHC: 31.3 g/dL (ref 30.0–36.0)
MCV: 87.7 fL (ref 78.0–100.0)
Platelets: 246 K/uL (ref 150–400)
RBC: 3.75 MIL/uL — ABNORMAL LOW (ref 4.22–5.81)
RDW: 14.5 % (ref 11.5–15.5)
WBC: 10.2 K/uL (ref 4.0–10.5)

## 2012-03-28 MED ORDER — LEVOFLOXACIN IN D5W 750 MG/150ML IV SOLN
750.0000 mg | INTRAVENOUS | Status: AC
  Administered 2012-03-28 – 2012-04-11 (×15): 750 mg via INTRAVENOUS
  Filled 2012-03-28 (×15): qty 150

## 2012-03-28 NOTE — Progress Notes (Signed)
26 Days Post-Op  Subjective: *On vent but notes that he had 2 BM this AM, mild abdominal pain exacerbated by cough  Objective: Vital signs in last 24 hours: Temp:  [98.2 F (36.8 C)-98.8 F (37.1 C)] 98.3 F (36.8 C) (06/11 0804) Pulse Rate:  [82-94] 91  (06/11 0804) Resp:  [11-27] 25  (06/11 0804) BP: (100-131)/(70-89) 121/80 mmHg (06/11 0804) SpO2:  [98 %-100 %] 99 % (06/11 0851) FiO2 (%):  [40 %] 40 % (06/11 0851) Weight:  [71.8 kg (158 lb 4.6 oz)] 71.8 kg (158 lb 4.6 oz) (06/10 2357) Last BM Date: 03/27/12  Intake/Output from previous day: 06/10 0701 - 06/11 0700 In: 3570 [I.V.:180; NG/GT:2740; IV Piggyback:650] Out: 501 [Urine:500; Stool:1] Intake/Output this shift:    Resp: some rhonchi B GI: soft, VAC on midline wound, minimal tenderness B, + active BS Extremities: calves soft  Lab Results:   Basename 03/27/12 1050  WBC 9.8  HGB 10.1*  HCT 32.3*  PLT 251   BMET  Basename 03/28/12 0345  NA 144  K 3.6  CL 108  CO2 28  GLUCOSE 115*  BUN 17  CREATININE 0.60  CALCIUM 8.3*   PT/INR No results found for this basename: LABPROT:2,INR:2 in the last 72 hours ABG No results found for this basename: PHART:2,PCO2:2,PO2:2,HCO3:2 in the last 72 hours  Studies/Results: Dg Chest Port 1 View  03/27/2012  *RADIOLOGY REPORT*  Clinical Data: Pneumonia.  PORTABLE CHEST - 1 VIEW  Comparison: Chest x-ray 03/25/2012.  Findings: A tracheostomy tube is in place with tip 4.7 cm above the carina. A feeding tube is seen extending into the abdomen, however, the tip of the feeding tube extends below the lower margin of the image.  Lung volumes remain low, with persistent elevation of the right hemidiaphragm.  There are patchy predominantly linear opacities throughout the mid and lower lungs bilaterally (right greater than left), which is favored to in large part reflect areas of atelectasis and/or scarring, however, superimposed airspace disease from aspiration or developing infection  in the right base is not excluded.  Probable small right-sided pleural effusion.  No definite left pleural effusion.  No evidence of pulmonary edema. Heart size is within normal limits. The patient is rotated to the left on today's exam, resulting in distortion of the mediastinal contours and reduced diagnostic sensitivity and specificity for mediastinal pathology.  Atherosclerotic calcifications within the arch of the aorta.  IMPRESSION: 1.  Support apparatus, as above. 2.  Slight worsening aeration throughout the lung bases bilaterally (right greater than left), concerning for sequela of aspiration or developing infection. 3.  Probable small right-sided pleural effusion.  Original Report Authenticated By: Florencia Reasons, M.D.    Anti-infectives: Anti-infectives     Start     Dose/Rate Route Frequency Ordered Stop   03/27/12 1600   vancomycin (VANCOCIN) 1,250 mg in sodium chloride 0.9 % 250 mL IVPB        1,250 mg 166.7 mL/hr over 90 Minutes Intravenous Every 12 hours 03/27/12 0541     03/25/12 0600   vancomycin (VANCOCIN) 750 mg in sodium chloride 0.9 % 150 mL IVPB  Status:  Discontinued        750 mg 150 mL/hr over 60 Minutes Intravenous Every 12 hours 03/24/12 1423 03/27/12 0541   03/24/12 1600   vancomycin (VANCOCIN) 1,500 mg in sodium chloride 0.9 % 500 mL IVPB        1,500 mg 250 mL/hr over 120 Minutes Intravenous  Once 03/24/12 1423 03/24/12  1856   03/24/12 1530   cefTAZidime (FORTAZ) 1 g in dextrose 5 % 50 mL IVPB        1 g 100 mL/hr over 30 Minutes Intravenous Every 8 hours 03/24/12 1423     03/24/12 1400   vancomycin (VANCOCIN) IVPB 1000 mg/200 mL premix  Status:  Discontinued        1,000 mg 200 mL/hr over 60 Minutes Intravenous Every 12 hours 03/24/12 1353 03/24/12 1400   03/03/12 1800   vancomycin (VANCOCIN) 1,750 mg in sodium chloride 0.9 % 500 mL IVPB  Status:  Discontinued        1,750 mg 250 mL/hr over 120 Minutes Intravenous Every 12 hours 03/03/12 0628 03/03/12  0918   02/29/12 1900   micafungin (MYCAMINE) 100 mg in sodium chloride 0.9 % 100 mL IVPB  Status:  Discontinued        100 mg 100 mL/hr over 1 Hours Intravenous Every 24 hours 02/29/12 1754 03/01/12 0933   02/29/12 1730   vancomycin (VANCOCIN) 1,250 mg in sodium chloride 0.9 % 250 mL IVPB  Status:  Discontinued        1,250 mg 166.7 mL/hr over 90 Minutes Intravenous Every 12 hours 02/29/12 1644 03/03/12 0628   02/29/12 1715   vancomycin (VANCOCIN) IVPB 1000 mg/200 mL premix  Status:  Discontinued        1,000 mg 200 mL/hr over 60 Minutes Intravenous  Once 02/29/12 1637 02/29/12 1647   02/29/12 1645   piperacillin-tazobactam (ZOSYN) IVPB 3.375 g  Status:  Discontinued        3.375 g 100 mL/hr over 30 Minutes Intravenous  Once 02/29/12 1637 02/29/12 1641   02/28/12 1800   piperacillin-tazobactam (ZOSYN) IVPB 3.375 g  Status:  Discontinued        3.375 g 12.5 mL/hr over 240 Minutes Intravenous 4 times per day 02/28/12 1454 02/28/12 1510   02/28/12 1800   piperacillin-tazobactam (ZOSYN) IVPB 3.375 g  Status:  Discontinued        3.375 g 12.5 mL/hr over 240 Minutes Intravenous Every 8 hours 02/28/12 1510 03/14/12 1700   02/25/12 1109   dextrose 5 % with cefOXitin (MEFOXIN) ADS Med     Comments: HOLLIE, CHRISTINA: cabinet override         02/25/12 1109 02/25/12 2314   02/25/12 1100   cefOXitin (MEFOXIN) 1 g in dextrose 5 % 50 mL IVPB  Status:  Discontinued        1 g 100 mL/hr over 30 Minutes Intravenous 60 min pre-op 02/25/12 1100 02/25/12 1542          Assessment/Plan: s/p Procedure(s) (LRB): DEBRIDEMENT CLOSURE/ABDOMINAL WOUND (N/A) Status post exploratory laparotomy and lysis of adhesions for SBO at any [facility].  Status post transfer to [facility] for respiratory failure requiring tracheostomy and subsequent abdominal exploration and wound debridement and closure for abdominal wound dehiscence.  Afebrile and tolerating TF, +BM - will check CBC today, if very high may  consider CT A/P but there is no other indication of abdominal infection now. Continue wound VAC. VDRF/PNA - per CCM  LOS: 33 days    Serapio Edelson E 03/28/2012

## 2012-03-28 NOTE — Progress Notes (Addendum)
Occupational Therapy Treatment Patient Details Name: KRISTIN LAMAGNA MRN: [id number] DOB: 22-Oct-1945 Today's Date: 03/28/2012 Time: 7829-5621 OT Time Calculation (min): 30 min  OT Assessment / Plan / Recommendation Comments on Treatment Session Pt having difficulty meeting goals due to difficulty weining from the vent. Pt continues to fatigue quickly and has limited activity tolerance. Will update goals as appropritate.    Follow Up Recommendations  Skilled nursing facility    Barriers to Discharge       Equipment Recommendations  Defer to next venue    Recommendations for Other Services    Frequency Min 2X/week   Plan Discharge plan remains appropriate    Precautions / Restrictions Precautions Precautions: Fall Precaution Comments: on vent Restrictions Weight Bearing Restrictions: No   Pertinent Vitals/Pain Pt ambulated at 100% O2. C/o SOB and had to sit. O2 sats at99-100% once at rest O2 at 40%    ADL  Toilet Transfer: Minimal assistance Toilet Transfer: Patient Percentage: 80% Toilet Transfer Method: Stand pivot Toilet Transfer Equipment: Bedside commode Toileting - Clothing Manipulation and Hygiene: +1 Total assistance Where Assessed - Toileting Clothing Manipulation and Hygiene: Sit to stand from 3-in-1 or toilet Transfers/Ambulation Related to ADLs: Ambulated half the length of the hallway on vent. Difficulty meeting ADL goals due to pt unable to wein from vent. ADL Comments: Pt had voided a significant amount of bloody stool. RN present.    OT Diagnosis:    OT Problem List:   OT Treatment Interventions:     OT Goals Acute Rehab OT Goals Time For Goal Achievement: 04/11/12 Potential to Achieve Goals: Good ADL Goals Pt Will Perform Grooming: with supervision;Standing at sink;Unsupported ADL Goal: Grooming - Progress: Goal set today Pt Will Perform Upper Body Bathing: with set-up;Sitting, edge of bed;Sitting, chair;Unsupported ADL Goal: Upper Body Bathing -  Progress: Goal set today Pt Will Perform Lower Body Bathing: Sit to stand from chair;Sit to stand from bed;with mod assist ADL Goal: Lower Body Bathing - Progress: Goal set today Pt Will Perform Upper Body Dressing: with set-up;Sitting, chair;Sitting, bed;Unsupported ADL Goal: Upper Body Dressing - Progress: Goal set today Pt Will Perform Lower Body Dressing: with mod assist;Sit to stand from bed;Sit to stand from chair ADL Goal: Lower Body Dressing - Progress: Goal set today Pt Will Transfer to Toilet: with supervision;Ambulation;Stand pivot transfer;3-in-1;Comfort height toilet ADL Goal: Toilet Transfer - Progress: Goal set today Pt Will Perform Toileting - Clothing Manipulation: with mod assist;Standing ADL Goal: Toileting - Clothing Manipulation - Progress: Goal set today Pt Will Perform Toileting - Hygiene: with mod assist;Sit to stand from 3-in-1/toilet ADL Goal: Toileting - Hygiene - Progress: Goal set today Miscellaneous OT Goals Miscellaneous OT Goal #1: Pt will verbalize 3 energy conservation strategies for successful ADL completion. OT Goal: Miscellaneous Goal #1 - Progress: Goal set today  Visit Information  Last OT Received On: 03/28/12 Assistance Needed: +2 (for lines and leads.)    Subjective Data  Subjective: Pt back on vent but able to mouth simple answers to questions.   Prior Functioning       Cognition  Overall Cognitive Status: Appears within functional limits for tasks assessed/performed Difficult to assess due to: Other (comment) (vent) Arousal/Alertness: Awake/alert Orientation Level: Appears intact for tasks assessed Behavior During Session: [facility] for tasks performed    Mobility Bed Mobility Supine to Sit: With rails;HOB elevated Sitting - Scoot to Edge of Bed: 4: Min guard Details for Bed Mobility Assistance: min VCs for hand placement and technique. Transfers Sit to  Stand: With upper extremity assist;4: Min guard;From bed;From chair/3-in-1 Stand to  Sit: 4: Min guard;With upper extremity assist;To chair/3-in-1;With armrests Stand to Sit: Patient Percentage: 80% Details for Transfer Assistance: min VCs needed for hand placement today and to pace self when transferring to [facility]. Pt reported SOB with ambulation but O2 saturation remained at 99-100%   Exercises    Balance Static Sitting Balance Static Sitting - Balance Support: No upper extremity supported;Feet supported Static Sitting - Level of Assistance: 6: Modified independent (Device/Increase time) Static Standing Balance Static Standing - Balance Support: Bilateral upper extremity supported;During functional activity Static Standing - Level of Assistance: 4: Min assist  End of Session OT - End of Session Equipment Utilized During Treatment: Gait belt Activity Tolerance: Patient limited by fatigue Patient left: in chair   Saman Umstead A OTR/L [phone] 03/28/2012, 11:14 AM

## 2012-03-28 NOTE — Progress Notes (Addendum)
Name: Michael Ellison MRN: [id number] DOB: 1946-09-24    LOS: 33  PULMONARY / CRITICAL CARE MEDICINE  Pt Profile:   66 y/o male smoker admitted to [facility] 02/24/2012 with n/v and abdominal pain due to SBO due to adhesions.  Had ex lap with SB resection 5/10.  Developed delirium post-op.  Transferred to [facility] 5/14 for respiratory failure requiring intubation.  Developed abdominal fascial dehiscence and taken back to OR 5/16.  Failed extubation, and required tracheostomy 5/22. PMHx ETOH, HTN, Prostate cancer  Interval hx:  5/16: OR for fascial dehiscence repair- closed with wound vac 5/18- failed weaning 5/19- extubated 5/21- re-intubated for hypercarbia 5/22-improved co2 and neurostatus, pos almost 2 liters 5/22 SVT>>resolved spontaneously 5/30 -off vent for 24 hours, planned Panda placement 6/1> up in chair, improved. 6/3- desat, increased WOB, vent 6/3- leak trach, improved with extra long 6/5: FEES eval: failed, still showing evidence of aspiration.   Subjective: Still not off vent.   Vital Signs: Temp:  [98.2 F (36.8 C)-98.8 F (37.1 C)] 98.3 F (36.8 C) (06/11 0804) Pulse Rate:  [82-94] 91  (06/11 0804) Resp:  [11-27] 25  (06/11 0804) BP: (100-131)/(70-89) 121/80 mmHg (06/11 0804) SpO2:  [98 %-100 %] 99 % (06/11 0851) FiO2 (%):  [40 %] 40 % (06/11 0851) Weight:  [71.8 kg (158 lb 4.6 oz)] 71.8 kg (158 lb 4.6 oz) (06/10 2357)  Physical Examination: General - Alert and interactive. HEENT - trach site clean Cardiac - s1s2 regular, no murmur Chest - resps even non labored, back on vent, scattered rhonchi Abd - midline open wound with wound vac in place, +bs Ext - no edema Neuro -   CBC  Lab 03/27/12 1050 03/25/12 0347 03/24/12 0410  HGB 10.1* 12.0* 12.4*  HCT 32.3* 37.5* 40.0  WBC 9.8 19.1* 10.4  PLT 251 379 402*   BMET  Lab 03/28/12 0345 03/25/12 0347 03/24/12 0410 03/23/12 0410 03/22/12 0526  NA 144 145 147* 146* 145  K 3.6 4.0 -- -- --  CL 108 111 112 109 111    CO2 28 24 24 22 25   GLUCOSE 115* 148* 113* 66* 83  BUN 17 48* 42* 31* 24*  CREATININE 0.60 1.26 0.97 1.01 0.86  CALCIUM 8.3* 8.6 9.0 9.2 8.9  MG -- -- -- -- --  PHOS -- -- -- -- --   Lab Results  Component Value Date   ALT 17 03/25/2012   AST 13 03/25/2012   ALKPHOS 72 03/25/2012   BILITOT 0.4 03/25/2012    Intake/Output Summary (Last 24 hours) at 03/28/12 0955 Last data filed at 03/28/12 0700  Gross per 24 hour  Intake   3230 ml  Output    501 ml  Net   2729 ml   Medications reviewed   ASSESSMENT AND PLAN  PULMONARY ETT:  5/14>>>5/19 >>> 5/21 Trach (DF) 5/22>> 5/14 CT chest>> b/l effusions and basilar ATX/consolidation, severe bullous lung disease with emphysema in upper lobes b/l. 6/7 PCXR: no sig change in bibasilar volume loss and atx. 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  Failed extubation, and s/p trach 5/22. Xtra long 6 cuffed 6/3>>>  Has not been able to come off vent support. Think deconditioning and Pneumonia contributing to this. He has both Klebsiella and Stenotrophomonas.  Plan: -wean Peep -Cont scheduled BDs, will continue ICS -Cont vibravest. -see ID section -repeat cxr in am  CARDIOVASCULAR left [place] CVL 5/14>>>  5/31 Rt femoral aline 5/14>>>out  HTN, Edema, Intermittent tachycardia. Actually think he may be vascularly dry, will hold lasix. Plan: KVO IVF. Chem in am   RENAL  Hypernatremia, better Plan: -Continue to hold lasix but will attempt to keep even from here on out, will [facility] IVF.  GASTROINTESTINAL  5/09 CT abd/pelvis>>SBO, RLQ transition point, small Rt inguinal hernia, diverticulosis 5/14 CT abd/pelvis>>ileus ABD Wound Vac 5/16>>  SBO s/p lap with SB resection 5/10 complicated by post-op ileus and fascial dehiscence, and return to OR 5/16. Chronic pain. Plan: -Post-op care per CCS, appreciate wound care  Dysphagia w/ evidence  of penetration and probable aspiration observed on FEES completed 6/5.  P: -Resumed TF  -will talk to surgery about possible PEG.   HEMATOLOGIC  Anemia of critical illness. Plan: -transfuse for Hb < 7 -Wbc in am   INFECTIOUS  Cultures: BCX2 5/14 >>1/2 gram neg rod>>> 1/2 Klebsiella  UC 5/14 >>Enterococcus (sensitive to ampicillin) Sputum 5/14>> Klebsiella pneumoniae (pan sensitive) Wound 5/14>>Klebsiella Pneumoniae (pan sensitive) & Enterobacter Cloacae (resistant to ancef, cefoxitin) BC X2 5/20>>>ng Sputum 5/22>>>klebs, enterobacter Sputum 6/7>KLEBSIELLA PNEUMONIAE and STENOTROPHOMONAS MALTOPHILIA.  Antibiotics: vanc 5/14 >> 5/17 Zosyn 5/13 >>5/28 Vanc 6/9>>>6/11 Ceftaz 6/9 (klebsiella)>>> levaquin (stenotrophomonas) 6/11>>>  Wound infection, aspiration pneumonia, UTI. Plan: -add levaquin, cont ceftaz, stop vanc -CXR in am   ENDOCRINE CBG (last 3)  Hyperglycemia., hypoglycemic on 6/6 Plan: -Cont cbgs -Add dextrose to IVFs  NEUROLOGIC  Acute metabolic encephalopathy 2nd to sepsis, respiratory failure, post-op pain, and ETOH.  Much improved.  Intolerant of lorazepam (does okay with midazolam). Plan: -PRN fentanyl for pain  -Limit benzo's and haldol -PT/OT when ok with CCS  -Needs PEG for placement. -To chair daily as tolerated  Depression / Anxiety Plan: -Began lexapro  5/31, hx of depression -was not on meds PTA (had stopped taking), ETOH abuse   DISPO - has no LTAC benefits, per social work and family.  Will need PEG for placement, will contact CCS 6/11.  [name], 03/28/2012  9:55 AM  Will change abx to levaquin after sensitivties are resulted.  Will decrease PEEP to 8 and continue to decrease FiO2 and vent support as tolerated.  Will contact surgery for ?PEG.  Patient seen and examined, agree with above note.  I dictated the care and orders written for this patient under my direction.  [name], M.D. [phone]

## 2012-03-28 NOTE — Progress Notes (Signed)
Nutrition Follow-up  TF resumed s/p bronchoscopy. RN reports that pt is tolerating goal rate well. Continues to have loose, blood stools per RN. Discussed nutrition plan of care with RN, unable to report if pt to have swallow evaluation soon.  Diet Order:  NPO TF: Osmolite 1.5 at 60 ml/hr with Prostat 30 ml BID provides: 1928 kcal, 110 grams protein, 1181 ml free water  Free water of 200 ml q 4 hours. Provides additional 1200 ml fluid daily.  Meds: Scheduled Meds:   . albuterol  2.5 mg Nebulization QID  . antiseptic oral rinse  15 mL Mouth Rinse QID  . budesonide  0.5 mg Nebulization BID  . cefTAZidime (FORTAZ)  IV  1 g Intravenous Q8H  . chlorhexidine  15 mL Mouth Rinse BID  . enoxaparin  40 mg Subcutaneous Q24H  . escitalopram  10 mg Oral Daily  . feeding supplement  30 mL Per Tube BID  . fentaNYL  100 mcg Intravenous Once  . free water  200 mL Per Tube Q4H  . metoprolol  2.5 mg Intravenous Q8H  . midazolam  2 mg Intravenous Once  . pantoprazole sodium  40 mg Per Tube QHS  . sodium chloride  3 mL Intravenous Q12H  . vancomycin  1,250 mg Intravenous Q12H   Continuous Infusions:   . dextrose 5 % and 0.45% NaCl 10 mL/hr at 03/23/12 1445  . feeding supplement (OSMOLITE 1.2 CAL) 1,000 mL (03/27/12 1643)   PRN Meds:.albuterol, ALPRAZolam, bisacodyl, fentaNYL, hydrALAZINE, phenol  Labs:  CMP     Component Value Date/Time   NA 144 03/28/2012 0345   K 3.6 03/28/2012 0345   CL 108 03/28/2012 0345   CO2 28 03/28/2012 0345   GLUCOSE 115* 03/28/2012 0345   BUN 17 03/28/2012 0345   CREATININE 0.60 03/28/2012 0345   CALCIUM 8.3* 03/28/2012 0345   PROT 7.6 03/25/2012 0347   ALBUMIN 2.2* 03/25/2012 0347   AST 13 03/25/2012 0347   ALT 17 03/25/2012 0347   ALKPHOS 72 03/25/2012 0347   BILITOT 0.4 03/25/2012 0347   GFRNONAA >90 03/28/2012 0345   GFRAA >90 03/28/2012 0345    Intake/Output Summary (Last 24 hours) at 03/28/12 1209 Last data filed at 03/28/12 1100  Gross per 24 hour  Intake   3380 ml    Output    551 ml  Net   2829 ml    Weight Status:  71.7 kg - wt increasing  Body mass index is 20.32 kg/(m^2). WNL  Re-estimated needs:  1850-1950 kcals, 105-120 grams protein daily  Nutrition Dx:  Inadequate oral intake - ongoing.  Goal:  Intake to meet 90-100% of estimated nutrition needs, met  Intervention:  Current regimen is meeting patient's needs and pt's weight is stabilizing. Continue current regimen.  Monitor:  Weights, labs, PO intake, I/O's, vent use  Adair Laundry Pager #:  [phone]

## 2012-03-28 NOTE — Progress Notes (Signed)
Physical Therapy Treatment Patient Details Name: Michael Ellison MRN: [id number] DOB: 26-Mar-1946 Today's Date: 03/28/2012 Time: 4098-1191 PT Time Calculation (min): 32 min  PT Assessment / Plan / Recommendation Comments on Treatment Session  Patient s/p SB resection with fascial dehiscence with VDRF with trach.  PAtient still on vent today therefore called RT to ambulated with patient and PT/OT so that ambulation could continue to progress.      Follow Up Recommendations  Skilled nursing facility;Supervision/Assistance - 24 hour    Barriers to Discharge  None      Equipment Recommendations  Defer to next venue    Recommendations for Other Services  None  Frequency Min 3X/week   Plan Discharge plan remains appropriate;Frequency remains appropriate    Precautions / Restrictions Precautions Precautions: Fall Precaution Comments: on vent Restrictions Weight Bearing Restrictions: No   Pertinent Vitals/Pain VSS, Some pain    Mobility  Bed Mobility Bed Mobility: Rolling Right;Right Sidelying to Sit Rolling Right: 5: Supervision Right Sidelying to Sit: 4: Min assist;With rails;HOB flat Supine to Sit: Not tested (comment) Sitting - Scoot to Edge of Bed: 4: Min guard Sit to Supine: Not Tested (comment) Details for Bed Mobility Assistance: min VCs for hand placement and technique. Transfers Transfers: Sit to Stand;Stand to Sit Sit to Stand: With upper extremity assist;4: Min assist;From bed Stand to Sit: 4: Min assist;With upper extremity assist;To chair/3-in-1;With armrests Stand to Sit: Patient Percentage: 80% Stand Pivot Transfers: Not tested (comment) Details for Transfer Assistance: verbal cues needed for hand placement.  PAtient reported SOB with ambulation but O2 99- 100%.   Ambulation/Gait Ambulation/Gait Assistance: 4: Min guard Ambulation Distance (Feet): 100 Feet Assistive device: Rolling walker Ambulation/Gait Assistance Details: Patient ambulated on the vent today  with RT present.  Did well.  Distance decr but did well.  Slightly unsteady today questionably secondary to being on vent.   Gait Pattern: Step-through pattern;Decreased step length - right;Decreased step length - left;Decreased stride length;Shuffle Gait velocity: Slow cadence Stairs: No Wheelchair Mobility Wheelchair Mobility: No    PT Goals Acute Rehab PT Goals PT Goal: Supine/Side to Sit - Progress: Progressing toward goal PT Goal: Sit at [place] Of Bed - Progress: Progressing toward goal PT Goal: Sit to Stand - Progress: Progressing toward goal PT Goal: Stand to Sit - Progress: Progressing toward goal PT Transfer Goal: Bed to Chair/Chair to Bed - Progress: Progressing toward goal PT Goal: Ambulate - Progress: Progressing toward goal  Visit Information  Last PT Received On: 03/28/12 Assistance Needed: +2 (for lines and leads) PT/OT Co-Evaluation/Treatment: Yes    Subjective Data  Subjective: Patient still on vent   Cognition  Overall Cognitive Status: Appears within functional limits for tasks assessed/performed Difficult to assess due to: Tracheostomy (on vent) Arousal/Alertness: Awake/alert Orientation Level: Appears intact for tasks assessed Behavior During Session: [facility] for tasks performed    Balance  Static Sitting Balance Static Sitting - Balance Support: No upper extremity supported;Feet supported Static Sitting - Level of Assistance: 6: Modified independent (Device/Increase time) Static Sitting - Comment/# of Minutes: 2 Static Standing Balance Static Standing - Balance Support: Bilateral upper extremity supported;During functional activity Static Standing - Level of Assistance: 4: Min assist  End of Session PT - End of Session Equipment Utilized During Treatment: Gait belt Activity Tolerance: Patient tolerated treatment well Patient left: in chair;with call bell/phone within reach Nurse Communication: Mobility status    INGOLD,Chaos Carlile 03/28/2012, 1:27 PM  [facility] [phone] (pager)

## 2012-03-28 NOTE — Progress Notes (Signed)
Pt weaning from vent, SLP will defer therapy. Concerned that pt is not progressing, unlikely to show gain in swallow function given continued deconditioning. MD considering PEG placement which is likely warranted in this situation given high aspiration risk with POs and poor prognosis for recovery of swallow function in the near future. SLP will continue to follow for therapy. Harlon Ditty, MA CCC-SLP [phone]

## 2012-03-29 ENCOUNTER — Inpatient Hospital Stay (HOSPITAL_COMMUNITY): Payer: Medicare Other

## 2012-03-29 LAB — COMPREHENSIVE METABOLIC PANEL
ALT: 10 U/L (ref 0–53)
AST: 13 U/L (ref 0–37)
Albumin: 1.9 g/dL — ABNORMAL LOW (ref 3.5–5.2)
Alkaline Phosphatase: 49 U/L (ref 39–117)
Chloride: 104 mEq/L (ref 96–112)
Potassium: 3.4 mEq/L — ABNORMAL LOW (ref 3.5–5.1)
Sodium: 139 mEq/L (ref 135–145)
Total Bilirubin: 0.3 mg/dL (ref 0.3–1.2)
Total Protein: 6.1 g/dL (ref 6.0–8.3)

## 2012-03-29 LAB — CBC
HCT: 30.5 % — ABNORMAL LOW (ref 39.0–52.0)
Hemoglobin: 9.6 g/dL — ABNORMAL LOW (ref 13.0–17.0)
Platelets: 201 10*3/uL (ref 150–400)
RBC: 3.5 MIL/uL — ABNORMAL LOW (ref 4.22–5.81)
RDW: 14.7 % (ref 11.5–15.5)

## 2012-03-29 NOTE — Consult Note (Signed)
WOC consult Note Reason for Consult: requested to eval. pts wound on NPWT. Pt with open abdominal wound with NPWT dressing in place with 4 retention bars in place.  Wound type:surgical Measurement:13cm x 2.5cm x 2cm no tunneling or underminning Wound bed:95% beefy granulation tissue, 5% yellow slough in deepest part of wound base Drainage (amount, consistency, odor) serosanguinous in canister, no odor noted with dressing change Periwound:intact Dressing procedure/placement/frequency: 4pc of black granufoam used to fill wound bed, tucked under retention sutures.  Mushroom of black granufoam used to protect pt from [facility] pad.  Ostomy paste used over retention bar/sutures at sharp points to avoid disruption of seal of NPWT.  Seal obtained at without problems.  Pt received pain meds prior to dressing change today, he tolerated the dressing change well.   WOC will follow along with you for assistance with NPWT dressing as needed. Michael Ellison [place], [place] [phone]

## 2012-03-29 NOTE — Progress Notes (Signed)
Patient ID: Michael Ellison, male   DOB: Feb 04, 1946, 66 y.o.   MRN: [id number] 27 Days Post-Op  Subjective: On vent but answers questions well, mild abdominal pain exacerbated by cough  Objective: Vital signs in last 24 hours: Temp:  [98.2 F (36.8 C)-99.6 F (37.6 C)] 98.3 F (36.8 C) (06/12 0800) Pulse Rate:  [80-100] 85  (06/12 0800) Resp:  [19-33] 19  (06/12 0800) BP: (100-131)/(73-86) 100/74 mmHg (06/12 0800) SpO2:  [93 %-100 %] 99 % (06/12 0849) FiO2 (%):  [40 %] 40 % (06/12 0800) Weight:  [159 lb 9.8 oz (72.4 kg)] 159 lb 9.8 oz (72.4 kg) (06/12 0011) Last BM Date: 03/29/12  Intake/Output from previous day: 06/11 0701 - 06/12 0700 In: 2470 [I.V.:210; NG/GT:1960; IV Piggyback:300] Out: 260 [Urine:250; Drains:10] Intake/Output this shift: Total I/O In: 283 [I.V.:23; NG/GT:260] Out: 50 [Urine:50]  Resp: some rhonchi B GI: soft, VAC on midline wound, minimal tenderness B, + active BS Extremities: calves soft  Lab Results:   [facility] 03/29/12 0420 03/28/12 0949  WBC 9.6 10.2  HGB 9.6* 10.3*  HCT 30.5* 32.9*  PLT 201 246   BMET  Basename 03/29/12 0420 03/28/12 0345  NA 139 144  K 3.4* 3.6  CL 104 108  CO2 30 28  GLUCOSE 111* 115*  BUN 14 17  CREATININE 0.61 0.60  CALCIUM 8.2* 8.3*   PT/INR No results found for this basename: LABPROT:2,INR:2 in the last 72 hours ABG No results found for this basename: PHART:2,PCO2:2,PO2:2,HCO3:2 in the last 72 hours  Studies/Results: Dg Chest Port 1 View  03/29/2012  *RADIOLOGY REPORT*  Clinical Data: Assess endotracheal tube placement.  PORTABLE CHEST - 1 VIEW  Comparison: Chest x-ray 03/27/2012.  Findings: A tracheostomy tube is in place with tip 6.0 cm above the carina. A feeding tube is seen extending into the abdomen, however, the tip of the feeding tube extends below the lower margin of the image.  Lung volumes are low.  There are bibasilar opacities which are enlarged are favored to represent areas of subsegmental  atelectasis, however, sequelae of aspiration or developing infection could have a similar appearance. Bullous changes in the lung apices bilaterally (left greater than right) is again noted. Elevation of the right hemidiaphragm is unchanged.  There is minimal blunting of the costophrenic sulci bilaterally, such that trace bilateral pleural effusions is not excluded.  No evidence of pulmonary edema.  Heart size is normal. The patient is rotated to the left on today's exam, resulting in distortion of the mediastinal contours and reduced diagnostic sensitivity and specificity for mediastinal pathology.  Atherosclerotic calcifications within the arch of the aorta.  IMPRESSION: 1.  Support apparatus, as above. 2.  Allowing for slight differences in patient positioning, the radiographic appearance of chest is essentially unchanged, as detailed above.  Original Report Authenticated By: Florencia Reasons, M.D.    Anti-infectives: Anti-infectives     Start     Dose/Rate Route Frequency Ordered Stop   03/28/12 1500   levofloxacin (LEVAQUIN) IVPB 750 mg        750 mg 100 mL/hr over 90 Minutes Intravenous Every 24 hours 03/28/12 1348     03/27/12 1600   vancomycin (VANCOCIN) 1,250 mg in sodium chloride 0.9 % 250 mL IVPB  Status:  Discontinued        1,250 mg 166.7 mL/hr over 90 Minutes Intravenous Every 12 hours 03/27/12 0541 03/28/12 1348   03/25/12 0600   vancomycin (VANCOCIN) 750 mg in sodium chloride 0.9 % 150  mL IVPB  Status:  Discontinued        750 mg 150 mL/hr over 60 Minutes Intravenous Every 12 hours 03/24/12 1423 03/27/12 0541   03/24/12 1600   vancomycin (VANCOCIN) 1,500 mg in sodium chloride 0.9 % 500 mL IVPB        1,500 mg 250 mL/hr over 120 Minutes Intravenous  Once 03/24/12 1423 03/24/12 1856   03/24/12 1530   cefTAZidime (FORTAZ) 1 g in dextrose 5 % 50 mL IVPB        1 g 100 mL/hr over 30 Minutes Intravenous Every 8 hours 03/24/12 1423     03/24/12 1400   vancomycin (VANCOCIN) IVPB  1000 mg/200 mL premix  Status:  Discontinued        1,000 mg 200 mL/hr over 60 Minutes Intravenous Every 12 hours 03/24/12 1353 03/24/12 1400   03/03/12 1800   vancomycin (VANCOCIN) 1,750 mg in sodium chloride 0.9 % 500 mL IVPB  Status:  Discontinued        1,750 mg 250 mL/hr over 120 Minutes Intravenous Every 12 hours 03/03/12 0628 03/03/12 0918   02/29/12 1900   micafungin (MYCAMINE) 100 mg in sodium chloride 0.9 % 100 mL IVPB  Status:  Discontinued        100 mg 100 mL/hr over 1 Hours Intravenous Every 24 hours 02/29/12 1754 03/01/12 0933   02/29/12 1730   vancomycin (VANCOCIN) 1,250 mg in sodium chloride 0.9 % 250 mL IVPB  Status:  Discontinued        1,250 mg 166.7 mL/hr over 90 Minutes Intravenous Every 12 hours 02/29/12 1644 03/03/12 0628   02/29/12 1715   vancomycin (VANCOCIN) IVPB 1000 mg/200 mL premix  Status:  Discontinued        1,000 mg 200 mL/hr over 60 Minutes Intravenous  Once 02/29/12 1637 02/29/12 1647   02/29/12 1645   piperacillin-tazobactam (ZOSYN) IVPB 3.375 g  Status:  Discontinued        3.375 g 100 mL/hr over 30 Minutes Intravenous  Once 02/29/12 1637 02/29/12 1641   02/28/12 1800   piperacillin-tazobactam (ZOSYN) IVPB 3.375 g  Status:  Discontinued        3.375 g 12.5 mL/hr over 240 Minutes Intravenous 4 times per day 02/28/12 1454 02/28/12 1510   02/28/12 1800   piperacillin-tazobactam (ZOSYN) IVPB 3.375 g  Status:  Discontinued        3.375 g 12.5 mL/hr over 240 Minutes Intravenous Every 8 hours 02/28/12 1510 03/14/12 1700   02/25/12 1109   dextrose 5 % with cefOXitin (MEFOXIN) ADS Med     Comments: HOLLIE, CHRISTINA: cabinet override         02/25/12 1109 02/25/12 2314   02/25/12 1100   cefOXitin (MEFOXIN) 1 g in dextrose 5 % 50 mL IVPB  Status:  Discontinued        1 g 100 mL/hr over 30 Minutes Intravenous 60 min pre-op 02/25/12 1100 02/25/12 1542          Assessment/Plan: s/p Procedure(s) (LRB): DEBRIDEMENT CLOSURE/ABDOMINAL WOUND  (N/A) Status post exploratory laparotomy and lysis of adhesions for SBO at any [facility].  Status post transfer to [facility] for respiratory failure requiring tracheostomy and subsequent abdominal exploration and wound debridement and closure for abdominal wound dehiscence.  Afebrile and tolerating TF, +BM - WBC 9.6,  there is no other indication of abdominal infection now. Continue wound VAC,  Wound nurse to get measurements with vac change today for care management. VDRF/PNA -  per CCM  LOS: 34 days    Jilliana Burkes 03/29/2012

## 2012-03-29 NOTE — Progress Notes (Signed)
Slow ooze with BMs. No signs of abdominal infection. I spoke with Dr. Molli Knock from CCM regarding the patient as a whole and feeding access.  Patient has a panda and would require PEG to go to vent SNF in [place].  The patient had an open abdomen recently so open G-tube is not a viable option.  Will see if IR would consider it.  Otherwise I will consider doing a PEG if the family realizes the situation fully including the difficulty that would arise should complications develop with the PEG. Patient examined and I agree with the assessment and plan  Violeta Gelinas, MD, MPH, FACS Pager: [phone]  03/29/2012 5:17 PM

## 2012-03-29 NOTE — Progress Notes (Signed)
Name: Michael Ellison MRN: [id number] DOB: 1946-07-09    LOS: 34  PULMONARY / CRITICAL CARE MEDICINE  Pt Profile:   66 y/o male smoker admitted to [facility] 02/24/2012 with n/v and abdominal pain due to SBO due to adhesions.  Had ex lap with SB resection 5/10.  Developed delirium post-op.  Transferred to [facility] 5/14 for respiratory failure requiring intubation.  Developed abdominal fascial dehiscence and taken back to OR 5/16.  Failed extubation, and required tracheostomy 5/22. PMHx ETOH, HTN, Prostate cancer  Interval hx:  5/16: OR for fascial dehiscence repair- closed with wound vac 5/18- failed weaning 5/19- extubated 5/21- re-intubated for hypercarbia 5/22-improved co2 and neurostatus, pos almost 2 liters 5/22 SVT>>resolved spontaneously 5/30 -off vent for 24 hours, planned Panda placement 6/1> up in chair, improved. 6/3- desat, increased WOB, vent 6/3- leak trach, improved with extra long 6/5: FEES eval: failed, still showing evidence of aspiration.   Subjective: Still not off vent.   Vital Signs: Temp:  [98.2 F (36.8 C)-99.6 F (37.6 C)] 98.2 F (36.8 C) (06/12 1200) Pulse Rate:  [80-100] 81  (06/12 1200) Resp:  [18-27] 18  (06/12 1200) BP: (100-131)/(73-86) 125/80 mmHg (06/12 1200) SpO2:  [93 %-100 %] 100 % (06/12 1246) FiO2 (%):  [40 %] 40 % (06/12 1246) Weight:  [72.4 kg (159 lb 9.8 oz)] 72.4 kg (159 lb 9.8 oz) (06/12 0011)  Physical Examination: General - Alert and interactive. HEENT - trach site clean Cardiac - s1s2 regular, no murmur Chest - resps even non labored, back on vent, scattered rhonchi Abd - midline open wound with wound vac in place, +bs Ext - no edema Neuro -   CBC  Lab 03/29/12 0420 03/28/12 0949 03/27/12 1050  HGB 9.6* 10.3* 10.1*  HCT 30.5* 32.9* 32.3*  WBC 9.6 10.2 9.8  PLT 201 246 251   BMET  Lab 03/29/12 0420 03/28/12 0345 03/25/12 0347 03/24/12 0410 03/23/12 0410  NA 139 144 145 147* 146*  K 3.4* 3.6 -- -- --  CL 104 108 111 112 109    CO2 30 28 24 24 22   GLUCOSE 111* 115* 148* 113* 66*  BUN 14 17 48* 42* 31*  CREATININE 0.61 0.60 1.26 0.97 1.01  CALCIUM 8.2* 8.3* 8.6 9.0 9.2  MG -- -- -- -- --  PHOS -- -- -- -- --   Lab Results  Component Value Date   ALT 10 03/29/2012   AST 13 03/29/2012   ALKPHOS 49 03/29/2012   BILITOT 0.3 03/29/2012    Intake/Output Summary (Last 24 hours) at 03/29/12 1430 Last data filed at 03/29/12 1401  Gross per 24 hour  Intake   2233 ml  Output    260 ml  Net   1973 ml   Medications reviewed   ASSESSMENT AND PLAN  PULMONARY ETT:  5/14>>>5/19 >>> 5/21 Trach (DF) 5/22>> 5/14 CT chest>> b/l effusions and basilar ATX/consolidation, severe bullous lung disease with emphysema in upper lobes b/l. 6/7 PCXR: no sig change in bibasilar volume loss and atx. 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  Failed extubation, and s/p trach 5/22. Xtra long 6 cuffed 6/3>>>  Has not been able to come off vent support. Think deconditioning and Pneumonia contributing to this. He has both Klebsiella and Stenotrophomonas.  Plan: -Wean Peep down to 5. -Cont scheduled BDs, will continue ICS -Cont vibravest. -See ID section -Repeat cxr in am  CARDIOVASCULAR left  Schley CVL 5/14>>> 5/31 Rt femoral aline 5/14>>>out  HTN, Edema, Intermittent tachycardia. Actually think he may be vascularly dry, will hold lasix. Plan: KVO IVF. Chem in am   RENAL  Hypernatremia, better Plan: -UOP is low but renal function is intact begging the question of accuracy.  Will hold lasix and treat clinically, patient does not appear to be terribly fluid overloaded.  GASTROINTESTINAL  5/09 CT abd/pelvis>>SBO, RLQ transition point, small Rt inguinal hernia, diverticulosis 5/14 CT abd/pelvis>>ileus ABD Wound Vac 5/16>>  SBO s/p lap with SB resection 5/10 complicated by post-op ileus and fascial dehiscence, and return  to OR 5/16. Chronic pain. Surgery is working on PEG, they are aware of need but patient is not a candidate for open PEG. Plan: -Post-op care per [facility], appreciate wound care  Dysphagia w/ evidence of penetration and probable aspiration observed on FEES completed 6/5.  P: -Resumed TF  -PEG per surgery.   Lower GI Bleed: anemia not excessive. Plan:  Surgery to address lower GI bleeding.  Will recheck CBC in AM.  HEMATOLOGIC  Anemia of critical illness.   Plan: -transfuse for Hb < 7 -Wbc in am   INFECTIOUS  Cultures: BCX2 5/14 >>1/2 gram neg rod>>> 1/2 Klebsiella  UC 5/14 >>Enterococcus (sensitive to ampicillin) Sputum 5/14>> Klebsiella pneumoniae (pan sensitive) Wound 5/14>>Klebsiella Pneumoniae (pan sensitive) & Enterobacter Cloacae (resistant to ancef, cefoxitin) BC X2 5/20>>>ng Sputum 5/22>>>klebs, enterobacter Sputum 6/7>KLEBSIELLA PNEUMONIAE and STENOTROPHOMONAS MALTOPHILIA.  Antibiotics: vanc 5/14 >> 5/17 Zosyn 5/13 >>5/28 Vanc 6/9>>>6/11 Ceftaz 6/9 (klebsiella)>>> levaquin (stenotrophomonas) 6/11>>>  Wound infection, aspiration pneumonia, UTI. Plan: -Add levaquin, cont ceftaz, stop vanc -CXR in am   ENDOCRINE CBG (last 3)  Hyperglycemia., hypoglycemic on 6/6 Plan: -Cont cbgs -Add dextrose to IVFs  NEUROLOGIC  Acute metabolic encephalopathy 2nd to sepsis, respiratory failure, post-op pain, and ETOH.  Much improved.  Intolerant of lorazepam (does okay with midazolam). Plan: -PRN fentanyl for pain  -Limit benzo's and haldol -PT/OT when ok with [facility]  -Needs PEG for placement. -To chair daily as tolerated  Depression / Anxiety Plan: -Began lexapro  5/31, hx of depression -was not on meds PTA (had stopped taking), ETOH abuse  DISPO - has no LTAC benefits, per social work and family.  Will need PEG for placement, will contact [facility] 6/11.  Alyson Reedy, M.D. [facility] Pulmonary/Critical Care Medicine. Pager: [phone]. After hours pager: [phone].

## 2012-03-29 NOTE — Progress Notes (Signed)
CSW met with representative for [facility] regarding Vent SNF placement for this pt. MD, please note:  Facility will need pt to be on Assist Control or SIMV mode.and can not accommodate PRVC.  CSW will continue to follow.   Baxter Flattery, MSW [phone]

## 2012-03-29 NOTE — Progress Notes (Signed)
PT Cancellation Note  Treatment cancelled today due to medical issues with patient which prohibited therapy.  INGOLD,Michael Ellison 03/29/2012, 12:24 PM  Michael Ellison [facility] [phone] (pager)

## 2012-03-30 LAB — MAGNESIUM: Magnesium: 1.7 mg/dL (ref 1.5–2.5)

## 2012-03-30 LAB — BASIC METABOLIC PANEL
BUN: 12 mg/dL (ref 6–23)
Calcium: 8.4 mg/dL (ref 8.4–10.5)
Creatinine, Ser: 0.55 mg/dL (ref 0.50–1.35)
GFR calc Af Amer: >90 mL/min (ref >90)

## 2012-03-30 LAB — CBC
HCT: 30.9 % — ABNORMAL LOW (ref 39.0–52.0)
MCH: 27.6 pg (ref 26.0–34.0)
MCV: 87 fL (ref 78.0–100.0)
Platelets: 193 10*3/uL (ref 150–400)
RDW: 14.6 % (ref 11.5–15.5)

## 2012-03-30 MED ORDER — POTASSIUM CHLORIDE 20 MEQ/15ML (10%) PO LIQD
40.0000 meq | Freq: Once | ORAL | Status: AC
  Administered 2012-03-30: 40 meq
  Filled 2012-03-30: qty 30

## 2012-03-30 NOTE — Plan of Care (Signed)
Problem: Phase III Progression Outcomes Goal: IV/normal saline lock discontinued Outcome: Not Met (add Reason) Fluids at  [facility].

## 2012-03-30 NOTE — Progress Notes (Signed)
Occupational Therapy Treatment Patient Details Name: Michael Ellison MRN: [id number] DOB: [birth year] Today's Date: 03/30/2012 Time: 4098-1191 OT Time Calculation (min): 36 min  OT Assessment / Plan / Recommendation Comments on Treatment Session Pt made good ADL gains this session & increased his ambulation distance to 210 feet. Pt with improved activity and standing tolerance despite being on vent.    Follow Up Recommendations  Skilled nursing facility    Barriers to Discharge       Equipment Recommendations  Defer to next venue    Recommendations for Other Services    Frequency Min 2X/week   Plan Discharge plan remains appropriate    Precautions / Restrictions Precautions Precautions: Fall Precaution Comments: on vent   Pertinent Vitals/Pain O2 sats remained steady in upper 90s on vent during functional activity.    ADL  Toilet Transfer: Performed;Minimal assistance Toilet Transfer: Patient Percentage: 80% Toilet Transfer Method: Stand pivot Toilet Transfer Equipment: Bedside commode Toileting - Clothing Manipulation and Hygiene: Performed;Minimal assistance (for thoroughness only.) Where Assessed - Toileting Clothing Manipulation and Hygiene: Sit to stand from 3-in-1 or toilet Transfers/Ambulation Related to ADLs: Pt ambulated 210 feet with RW on vent. Pts 02 sats remained stable at 98-100%. Doing much better today. ADL Comments: Pt continuing to void bloody stool. RN aware.    OT Diagnosis:    OT Problem List:   OT Treatment Interventions:     OT Goals ADL Goals ADL Goal: Toilet Transfer - Progress: Progressing toward goals Pt Will Perform Toileting - Clothing Manipulation: with supervision;Standing ADL Goal: Toileting - Clothing Manipulation - Progress: Updated due to goal met Pt Will Perform Toileting - Hygiene: with supervision;Sit to stand from 3-in-1/toilet ADL Goal: Toileting - Hygiene - Progress: Updated due to goal met  Visit Information  Last OT  Received On: 03/30/12 Assistance Needed: +2 (for lines and leads.) PT/OT Co-Evaluation/Treatment: Yes    Subjective Data  Subjective: Pt on vent but able to mouth words and simple sentences.   Prior Functioning       Cognition  Overall Cognitive Status: Appears within functional limits for tasks assessed/performed Difficult to assess due to: Other (comment) (on vent) Arousal/Alertness: Awake/alert Orientation Level: Appears intact for tasks assessed Behavior During Session: [name] for tasks performed    Mobility Bed Mobility Supine to Sit: 4: Min guard Sitting - Scoot to Edge of Bed: 5: Supervision Details for Bed Mobility Assistance: min guard for lines. Pt with good technique. Transfers Sit to Stand: 4: Min assist;With upper extremity assist;With armrests;From bed;From chair/3-in-1 Stand to Sit: 4: Min assist;With upper extremity assist;To bed;To chair/3-in-1 Details for Transfer Assistance: min VCs for hand placement.    Exercises    Balance Static Sitting Balance Static Sitting - Balance Support: No upper extremity supported;Feet supported Static Sitting - Level of Assistance: 6: Modified independent (Device/Increase time) Static Standing Balance Static Standing - Balance Support: Left upper extremity supported;During functional activity Static Standing - Level of Assistance: 4: Min assist  End of Session OT - End of Session Activity Tolerance: Patient tolerated treatment well Patient left: in chair;with call bell/phone within reach   Michael Ellison A OTR/L [phone] 03/30/2012, 11:04 AM

## 2012-03-30 NOTE — Progress Notes (Signed)
Patient ID: Michael Ellison, male   DOB: Jun 09, 1946, 66 y.o.   MRN: [id number]   Subjective: On vent but answers questions well, reports abd sore but better then yesterday, tolerated vac change.    Objective: Vital signs in last 24 hours: Temp:  [97.8 F (36.6 C)-98.4 F (36.9 C)] 97.8 F (36.6 C) (06/13 0734) Pulse Rate:  [80-90] 84  (06/13 0734) Resp:  [17-24] 23  (06/13 0734) BP: (100-125)/(66-80) 114/75 mmHg (06/13 0734) SpO2:  [93 %-100 %] 94 % (06/13 0734) FiO2 (%):  [40 %] 40 % (06/13 0734) Weight:  [162 lb 11.2 oz (73.8 kg)] 162 lb 11.2 oz (73.8 kg) (06/13 0020) Last BM Date: 03/29/12  Intake/Output from previous day: 06/12 0701 - 06/13 0700 In: 3118 [I.V.:228; WU/JW:1191; IV Piggyback:250] Out: 250 [Urine:250] Intake/Output this shift:    Resp: some rhonchi B GI: soft, VAC on midline wound, minimal tenderness B, + active BS  Lab Results:   Basename 03/30/12 0400 03/29/12 0420  WBC 10.1 9.6  HGB 9.8* 9.6*  HCT 30.9* 30.5*  PLT 193 201   BMET  Basename 03/30/12 0400 03/29/12 0420  NA 137 139  K 3.3* 3.4*  CL 100 104  CO2 30 30  GLUCOSE 107* 111*  BUN 12 14  CREATININE 0.55 0.61  CALCIUM 8.4 8.2*   PT/INR No results found for this basename: LABPROT:2,INR:2 in the last 72 hours ABG No results found for this basename: PHART:2,PCO2:2,PO2:2,HCO3:2 in the last 72 hours  Studies/Results: Dg Chest Port 1 View  03/29/2012  *RADIOLOGY REPORT*  Clinical Data: Assess endotracheal tube placement.  PORTABLE CHEST - 1 VIEW  Comparison: Chest x-ray 03/27/2012.  Findings: A tracheostomy tube is in place with tip 6.0 cm above the carina. A feeding tube is seen extending into the abdomen, however, the tip of the feeding tube extends below the lower margin of the image.  Lung volumes are low.  There are bibasilar opacities which are enlarged are favored to represent areas of subsegmental atelectasis, however, sequelae of aspiration or developing infection could have a  similar appearance. Bullous changes in the lung apices bilaterally (left greater than right) is again noted. Elevation of the right hemidiaphragm is unchanged.  There is minimal blunting of the costophrenic sulci bilaterally, such that trace bilateral pleural effusions is not excluded.  No evidence of pulmonary edema.  Heart size is normal. The patient is rotated to the left on today's exam, resulting in distortion of the mediastinal contours and reduced diagnostic sensitivity and specificity for mediastinal pathology.  Atherosclerotic calcifications within the arch of the aorta.  IMPRESSION: 1.  Support apparatus, as above. 2.  Allowing for slight differences in patient positioning, the radiographic appearance of chest is essentially unchanged, as detailed above.  Original Report Authenticated By: Florencia Reasons, M.D.    Anti-infectives: Anti-infectives     Start     Dose/Rate Route Frequency Ordered Stop   03/28/12 1500   levofloxacin (LEVAQUIN) IVPB 750 mg        750 mg 100 mL/hr over 90 Minutes Intravenous Every 24 hours 03/28/12 1348     03/27/12 1600   vancomycin (VANCOCIN) 1,250 mg in sodium chloride 0.9 % 250 mL IVPB  Status:  Discontinued        1,250 mg 166.7 mL/hr over 90 Minutes Intravenous Every 12 hours 03/27/12 0541 03/28/12 1348   03/25/12 0600   vancomycin (VANCOCIN) 750 mg in sodium chloride 0.9 % 150 mL IVPB  Status:  Discontinued  750 mg 150 mL/hr over 60 Minutes Intravenous Every 12 hours 03/24/12 1423 03/27/12 0541   03/24/12 1600   vancomycin (VANCOCIN) 1,500 mg in sodium chloride 0.9 % 500 mL IVPB        1,500 mg 250 mL/hr over 120 Minutes Intravenous  Once 03/24/12 1423 03/24/12 1856   03/24/12 1530   cefTAZidime (FORTAZ) 1 g in dextrose 5 % 50 mL IVPB        1 g 100 mL/hr over 30 Minutes Intravenous Every 8 hours 03/24/12 1423     03/24/12 1400   vancomycin (VANCOCIN) IVPB 1000 mg/200 mL premix  Status:  Discontinued        1,000 mg 200 mL/hr over 60  Minutes Intravenous Every 12 hours 03/24/12 1353 03/24/12 1400   03/03/12 1800   vancomycin (VANCOCIN) 1,750 mg in sodium chloride 0.9 % 500 mL IVPB  Status:  Discontinued        1,750 mg 250 mL/hr over 120 Minutes Intravenous Every 12 hours 03/03/12 0628 03/03/12 0918   02/29/12 1900   micafungin (MYCAMINE) 100 mg in sodium chloride 0.9 % 100 mL IVPB  Status:  Discontinued        100 mg 100 mL/hr over 1 Hours Intravenous Every 24 hours 02/29/12 1754 03/01/12 0933   02/29/12 1730   vancomycin (VANCOCIN) 1,250 mg in sodium chloride 0.9 % 250 mL IVPB  Status:  Discontinued        1,250 mg 166.7 mL/hr over 90 Minutes Intravenous Every 12 hours 02/29/12 1644 03/03/12 0628   02/29/12 1715   vancomycin (VANCOCIN) IVPB 1000 mg/200 mL premix  Status:  Discontinued        1,000 mg 200 mL/hr over 60 Minutes Intravenous  Once 02/29/12 1637 02/29/12 1647   02/29/12 1645   piperacillin-tazobactam (ZOSYN) IVPB 3.375 g  Status:  Discontinued        3.375 g 100 mL/hr over 30 Minutes Intravenous  Once 02/29/12 1637 02/29/12 1641   02/28/12 1800   piperacillin-tazobactam (ZOSYN) IVPB 3.375 g  Status:  Discontinued        3.375 g 12.5 mL/hr over 240 Minutes Intravenous 4 times per day 02/28/12 1454 02/28/12 1510   02/28/12 1800   piperacillin-tazobactam (ZOSYN) IVPB 3.375 g  Status:  Discontinued        3.375 g 12.5 mL/hr over 240 Minutes Intravenous Every 8 hours 02/28/12 1510 03/14/12 1700   02/25/12 1109   dextrose 5 % with cefOXitin (MEFOXIN) ADS Med     Comments: HOLLIE, CHRISTINA: cabinet override         02/25/12 1109 02/25/12 2314   02/25/12 1100   cefOXitin (MEFOXIN) 1 g in dextrose 5 % 50 mL IVPB  Status:  Discontinued        1 g 100 mL/hr over 30 Minutes Intravenous 60 min pre-op 02/25/12 1100 02/25/12 1542          Assessment/Plan: s/p Procedure(s) (LRB): DEBRIDEMENT CLOSURE/ABDOMINAL WOUND (N/A) Status post exploratory laparotomy and lysis of adhesions for SBO at any [facility].  Status post transfer to [facility] for respiratory failure requiring tracheostomy and subsequent abdominal exploration and wound debridement and closure for abdominal wound dehiscence.  Afebrile and tolerating TF, +BM - ?PEG tube, tolerating TFs through panda, Continue wound VAC, We appreciate wound care nurse doing measurements for care management.   Per Melody Austin:   Measurement:13cm x 2.5cm x 2cm no tunneling or underminning  Wound bed:95% beefy granulation tissue, 5% yellow  slough in deepest part of wound base  Drainage (amount, consistency, odor) serosanguinous in canister, no odor noted with dressing change  Periwound:intact  Dressing procedure/placement/frequency: 4pc of black granufoam used to fill wound bed, tucked under retention sutures. Mushroom of black granufoam used to protect pt from [facility] pad. Ostomy paste used over retention bar/sutures at sharp points to avoid disruption of seal of NPWT. Seal obtained at without problems. Pt received pain meds prior to dressing change today, he tolerated the dressing change well.   VDRF/PNA - per CCM  LOS: 35 days    Natayla Cadenhead 03/30/2012

## 2012-03-30 NOTE — Progress Notes (Signed)
Placed pt on 40% ATC at this time.  Pt tolerating well.  VS HR 94, RR 22, SPO2 98%.  No complications noted.  No distress noted.

## 2012-03-30 NOTE — Progress Notes (Signed)
Physical Therapy Treatment Patient Details Name: Michael Ellison MRN: [id number] DOB: Feb 02, 1946 Today's Date: 03/30/2012 Time: 4098-1191 PT Time Calculation (min): 36 min  PT Assessment / Plan / Recommendation Comments on Treatment Session  Pt s/p SB resecetion, dehiscence, wound VAC, VDRF and trach able to increase ambulation on vent today with plans to attempt weaning this afternoon. Pt very pleasant and able to assist with pericare after BSC use today. Pt fatigued end of session and further HEP not attempted.     Follow Up Recommendations       Barriers to Discharge        Equipment Recommendations  Defer to next venue    Recommendations for Other Services    Frequency     Plan Discharge plan remains appropriate;Frequency remains appropriate    Precautions / Restrictions Precautions Precautions: Fall Precaution Comments: vent   Pertinent Vitals/Pain Pt premedicated PRVC, 40% on peep 5    Mobility  Bed Mobility Bed Mobility: Supine to Sit;Sitting - Scoot to Edge of Bed Supine to Sit: 4: Min guard Sitting - Scoot to Edge of Bed: 5: Supervision Sit to Supine: 4: Min guard;HOB flat;With rail Details for Bed Mobility Assistance: assist for management of lines only Transfers Transfers: Sit to Stand;Stand to Sit Sit to Stand: 4: Min assist;From bed;From chair/3-in-1 Stand to Sit: 4: Min assist;To chair/3-in-1;To bed Details for Transfer Assistance: assist for anterior translation and cueing for hand placement Ambulation/Gait Ambulation/Gait Assistance: 4: Min guard Ambulation Distance (Feet): 210 Feet Assistive device: Rolling walker Ambulation/Gait Assistance Details: cueing to look up and step into the RW, chair followed but not needed next session. RT managing vent throughout Gait Pattern: Step-through pattern;Decreased stride length Stairs: No    Exercises Total Joint Exercises Long Arc Quad: AROM;Both;10 reps;Seated   PT Diagnosis:    PT Problem List:   PT  Treatment Interventions:     PT Goals Acute Rehab PT Goals PT Goal: Supine/Side to Sit - Progress: Progressing toward goal PT Goal: Sit at Edge Of Bed - Progress: Progressing toward goal PT Goal: Sit to Stand - Progress: Progressing toward goal PT Goal: Stand to Sit - Progress: Progressing toward goal PT Transfer Goal: Bed to Chair/Chair to Bed - Progress: Progressing toward goal PT Goal: Ambulate - Progress: Progressing toward goal  Visit Information  Last PT Received On: 03/30/12 Assistance Needed: +2 (co treat with RT for vent management) PT/OT Co-Evaluation/Treatment: Yes    Subjective Data  Subjective: on vent, mouthing words to answer orientation and needs   Cognition  Overall Cognitive Status: Appears within functional limits for tasks assessed/performed Difficult to assess due to: Tracheostomy Arousal/Alertness: Awake/alert Orientation Level: Appears intact for tasks assessed Behavior During Session: [facility] for tasks performed    Balance  Static Sitting Balance Static Sitting - Balance Support: Bilateral upper extremity supported;Feet supported Static Sitting - Level of Assistance: 6: Modified independent (Device/Increase time) Static Sitting - Comment/# of Minutes: 20 Static Standing Balance Static Standing - Balance Support: Left upper extremity supported;During functional activity Static Standing - Level of Assistance: 4: Min assist  End of Session PT - End of Session Activity Tolerance: Patient tolerated treatment well Patient left: in chair;with call bell/phone within reach Nurse Communication: Mobility status    Delorse Lek 03/30/2012, 11:13 AM Delaney Meigs, PT [phone]

## 2012-03-30 NOTE — Progress Notes (Signed)
Name: Michael Ellison MRN: [id number] DOB: 1946-03-22    LOS: 35  PULMONARY / CRITICAL CARE MEDICINE  Pt Profile:   66 y/o male smoker admitted to [facility] 02/24/2012 with n/v and abdominal pain due to SBO due to adhesions.  Had ex lap with SB resection 5/10.  Developed delirium post-op.  Transferred to [facility] 5/14 for respiratory failure requiring intubation.  Developed abdominal fascial dehiscence and taken back to OR 5/16.  Failed extubation, and required tracheostomy 5/22. PMHx ETOH, HTN, Prostate cancer  Interval hx:  5/16: OR for fascial dehiscence repair- closed with wound vac 5/18- failed weaning 5/19- extubated 5/21- re-intubated for hypercarbia 5/22-improved co2 and neurostatus, pos almost 2 liters 5/22 SVT>>resolved spontaneously 5/30 -off vent for 24 hours, planned Panda placement 6/1> up in chair, improved. 6/3- desat, increased WOB, vent 6/3- leak trach, improved with extra long 6/5: FEES eval: failed, still showing evidence of aspiration.   Subjective: PEEP at 5 Walked w PT today on PRVC 200 feet!  Vital Signs: Temp:  [97.8 F (36.6 C)-98.4 F (36.9 C)] 97.8 F (36.6 C) (06/13 0734) Pulse Rate:  [81-90] 86  (06/13 0840) Resp:  [17-24] 22  (06/13 0840) BP: (108-125)/(66-80) 114/75 mmHg (06/13 0840) SpO2:  [94 %-100 %] 97 % (06/13 0856) FiO2 (%):  [40 %] 40 % (06/13 0856) Weight:  [73.8 kg (162 lb 11.2 oz)] 73.8 kg (162 lb 11.2 oz) (06/13 0020)  Physical Examination: General - Alert and interactive. HEENT - trach site clean Cardiac - s1s2 regular, no murmur Chest - resps even non labored, back on vent, scattered rhonchi Abd - midline open wound with wound vac in place, +bs Ext - no edema Neuro - awake and interacting, moves all ext  CBC  Lab 03/30/12 0400 03/29/12 0420 03/28/12 0949  HGB 9.8* 9.6* 10.3*  HCT 30.9* 30.5* 32.9*  WBC 10.1 9.6 10.2  PLT 193 201 246   BMET  Lab 03/30/12 0400 03/29/12 0420 03/28/12 0345 03/25/12 0347 03/24/12 0410  NA 137 139  144 145 147*  K 3.3* 3.4* -- -- --  CL 100 104 108 111 112  CO2 30 30 28 24 24   GLUCOSE 107* 111* 115* 148* 113*  BUN 12 14 17  48* 42*  CREATININE 0.55 0.61 0.60 1.26 0.97  CALCIUM 8.4 8.2* 8.3* 8.6 9.0  MG 1.7 -- -- -- --  PHOS 2.7 -- -- -- --   Lab Results  Component Value Date   ALT 10 03/29/2012   AST 13 03/29/2012   ALKPHOS 49 03/29/2012   BILITOT 0.3 03/29/2012    Intake/Output Summary (Last 24 hours) at 03/30/12 1106 Last data filed at 03/30/12 0600  Gross per 24 hour  Intake   2635 ml  Output    200 ml  Net   2435 ml   Medications reviewed   ASSESSMENT AND PLAN  PULMONARY ETT:  5/14>>>5/19 >>> 5/21 Trach (DF) 5/22>> 5/14 CT chest>> b/l effusions and basilar ATX/consolidation, severe bullous lung disease with emphysema in upper lobes b/l. 6/7 PCXR: no sig change in bibasilar volume loss and atx. 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  Failed extubation, and s/p trach 5/22. Xtra long 6 cuffed 6/3>>>  Has not been able to come off vent support. Think deconditioning and Pneumonia contributing to this. He has both Klebsiella and Stenotrophomonas. Making some progress after apparent mucous plugging in the last week Plan: -Cont scheduled BDs,  will continue ICS -restart ATC and PSV trials.  -Cont vibravest. -See ID section -Repeat cxr in am  CARDIOVASCULAR left [place] CVL 5/14>>> 5/31 Rt femoral aline 5/14>>>out  HTN, Edema, Intermittent tachycardia.  Plan: KVO IVF. Chem in am   RENAL  Hypernatremia, better Plan: -UOP is low but renal function is intact begging the question of accuracy.  Will hold lasix and treat clinically, patient does not appear to be terribly fluid overloaded.  GASTROINTESTINAL  5/09 CT abd/pelvis>>SBO, RLQ transition point, small Rt inguinal hernia, diverticulosis 5/14 CT abd/pelvis>>ileus ABD Wound Vac 5/16>>  SBO s/p lap with  SB resection 5/10 complicated by post-op ileus and fascial dehiscence, and return to OR 5/16. Chronic pain. Plan: -Post-op care per CCS, appreciate wound care -discussed w Dr Janee Morn >> open PEG would be very complicated; will need to involve IR to see if he can have a guided PEG. I believe there is still some hope that he will be able to be vent free and to take PO  Dysphagia w/ evidence of penetration and probable aspiration observed on FEES completed 6/5.  P: -Resumed TF  -will consult IR for possible PEG. If unable to do then will have to decide with CCS whether surgical PEG is possible   Lower GI Bleed: anemia not excessive. Plan. Will recheck CBC in AM.  HEMATOLOGIC  Anemia of critical illness.   Plan: -transfuse for Hb < 7 -Wbc in am   INFECTIOUS  Cultures: BCX2 5/14 >>1/2 gram neg rod>>> 1/2 Klebsiella  UC 5/14 >>Enterococcus (sensitive to ampicillin) Sputum 5/14>> Klebsiella pneumoniae (pan sensitive) Wound 5/14>>Klebsiella Pneumoniae (pan sensitive) & Enterobacter Cloacae (resistant to ancef, cefoxitin) BC X2 5/20>>>ng Sputum 5/22>>>klebs, enterobacter Sputum 6/7>KLEBSIELLA PNEUMONIAE and STENOTROPHOMONAS MALTOPHILIA.  Antibiotics: vanc 5/14 >> 5/17 Zosyn 5/13 >>5/28 Vanc 6/9>>>6/11 Ceftaz 6/9 (klebsiella)>>> levaquin (stenotrophomonas) 6/11>>>  Wound infection, aspiration pneumonia, UTI. Plan: -levaquin, cont ceftaz -CXR in am   ENDOCRINE CBG (last 3)  Hyperglycemia., hypoglycemic on 6/6 Plan: -Cont cbgs -Add dextrose to IVFs  NEUROLOGIC  Acute metabolic encephalopathy 2nd to sepsis, respiratory failure, post-op pain, and ETOH.  Much improved.  Intolerant of lorazepam (does okay with midazolam). Plan: -PRN fentanyl for pain  -Limit benzo's and haldol -PT/OT for ambulation -To chair daily as tolerated  Depression / Anxiety Plan: -Began lexapro  5/31, hx of depression -was not on meds PTA (had stopped taking), ETOH abuse  DISPO - has no LTAC  benefits, per social work and family.    Levy Pupa, MD, PhD 03/30/2012, 11:12 AM [facility] [phone] or if no answer [phone]

## 2012-03-30 NOTE — Progress Notes (Signed)
I spoke to Dr. Delton Coombes this AM.  Plan is for IR to place PEG.  Patient now getting up to walk with PT while on the vent. Patient examined and I agree with the assessment and plan  Violeta Gelinas, MD, MPH, FACS Pager: [phone]  03/30/2012 9:56 AM

## 2012-03-30 NOTE — Progress Notes (Signed)
ANTIBIOTIC CONSULT NOTE - FOLLOW UP  Pharmacy Consult for ceftazidime Indication: pneumonia  Labs:  Basename 03/30/12 0400 03/29/12 0420 03/28/12 0949 03/28/12 0345  WBC 10.1 9.6 10.2 --  HGB 9.8* 9.6* 10.3* --  PLT 193 201 246 --  LABCREA -- -- -- --  CREATININE 0.55 0.61 -- 0.60   Estimated Creatinine Clearance: 96.1 ml/min (by C-G formula based on Cr of 0.55). No results found for this basename: VANCOTROUGH:2,VANCOPEAK:2,VANCORANDOM:2,GENTTROUGH:2,GENTPEAK:2,GENTRANDOM:2,TOBRATROUGH:2,TOBRAPEAK:2,TOBRARND:2,AMIKACINPEAK:2,AMIKACINTROU:2,AMIKACIN:2, in the last 72 hours   Assessment: Currently on ceftazidime and levaquin for stenotrophomonas/klebsiella PNA. They are sens to levaquin.   D7 Fortaz D3 Levaquin  Plan:  Cont levaquin at 750mg  IV q24 Consider dc fortaz at this point  Clide Cliff PharmD BCPS 03/30/2012,8:49 AM

## 2012-03-31 LAB — BASIC METABOLIC PANEL
BUN: 13 mg/dL (ref 6–23)
Chloride: 100 mEq/L (ref 96–112)
Creatinine, Ser: 0.65 mg/dL (ref 0.50–1.35)
GFR calc Af Amer: >90 mL/min (ref >90)
GFR calc non Af Amer: >90 mL/min (ref >90)
Glucose, Bld: 94 mg/dL (ref 70–99)
Potassium: 3.6 mEq/L (ref 3.5–5.1)

## 2012-03-31 MED ORDER — PRO-STAT SUGAR FREE PO LIQD
30.0000 mL | Freq: Three times a day (TID) | ORAL | Status: DC
  Administered 2012-03-31 – 2012-04-06 (×13): 30 mL
  Filled 2012-03-31 (×21): qty 30

## 2012-03-31 MED ORDER — JEVITY 1.2 CAL PO LIQD
1000.0000 mL | ORAL | Status: DC
  Administered 2012-03-31 – 2012-04-03 (×4): 1000 mL
  Filled 2012-03-31 (×10): qty 1000

## 2012-03-31 NOTE — Progress Notes (Signed)
As above.  Abdominal pain resolved. Blood in stools has almost completely resolved IR to evaluate for g-tube I called his daughter Elberta Spaniel on 2 different numbers and left messages. Patient examined and I agree with the assessment and plan  Violeta Gelinas, MD, MPH, FACS Pager: [phone]  03/31/2012 8:41 AM

## 2012-03-31 NOTE — Progress Notes (Signed)
Arrived to attempt PMSV trials given ATC toleration yesterday. At this time pt is still on the vent. Pt nods that he would like to try PMSV when on trach collar. Will ask the RN to page me if pt goes on ATC today. Thanks, Harlon Ditty, MA CCC-SLP [phone]

## 2012-03-31 NOTE — Progress Notes (Signed)
Patient ID: Michael Ellison, male   DOB: Dec 28, 1945, 66 y.o.   MRN: [id number] Patient ID: Michael Ellison, male   DOB: 01-Jul-1946, 66 y.o.   MRN: [id number]   Subjective: On vent but answers questions well, reports abd better, c/o throat being sore, walking on vent.    Objective: Vital signs in last 24 hours: Temp:  [97.5 F (36.4 C)-98.9 F (37.2 C)] 97.5 F (36.4 C) (06/14 0517) Pulse Rate:  [80-93] 84  (06/14 0517) Resp:  [15-27] 23  (06/14 0517) BP: (111-132)/(69-83) 132/83 mmHg (06/14 0517) SpO2:  [96 %-100 %] 99 % (06/14 0517) FiO2 (%):  [40 %] 40 % (06/14 0410) Weight:  [161 lb 9.6 oz (73.3 kg)] 161 lb 9.6 oz (73.3 kg) (06/14 0057) Last BM Date: 03/30/12  Intake/Output from previous day: 06/13 0701 - 06/14 0700 In: 2785 [I.V.:215; NG/GT:2520; IV Piggyback:50] Out: 100 [Urine:100] Intake/Output this shift:    Resp: some rhonchi B GI: soft, VAC on midline wound, minimal tenderness, + active BS  Lab Results:   Basename 03/30/12 0400 03/29/12 0420  WBC 10.1 9.6  HGB 9.8* 9.6*  HCT 30.9* 30.5*  PLT 193 201   BMET  Basename 03/31/12 0347 03/30/12 0400  NA 138 137  K 3.6 3.3*  CL 100 100  CO2 30 30  GLUCOSE 94 107*  BUN 13 12  CREATININE 0.65 0.55  CALCIUM 8.7 8.4   PT/INR No results found for this basename: LABPROT:2,INR:2 in the last 72 hours ABG No results found for this basename: PHART:2,PCO2:2,PO2:2,HCO3:2 in the last 72 hours  Studies/Results: No results found.  Anti-infectives: Anti-infectives     Start     Dose/Rate Route Frequency Ordered Stop   03/28/12 1500   levofloxacin (LEVAQUIN) IVPB 750 mg        750 mg 100 mL/hr over 90 Minutes Intravenous Every 24 hours 03/28/12 1348     03/27/12 1600   vancomycin (VANCOCIN) 1,250 mg in sodium chloride 0.9 % 250 mL IVPB  Status:  Discontinued        1,250 mg 166.7 mL/hr over 90 Minutes Intravenous Every 12 hours 03/27/12 0541 03/28/12 1348   03/25/12 0600   vancomycin (VANCOCIN) 750 mg in sodium  chloride 0.9 % 150 mL IVPB  Status:  Discontinued        750 mg 150 mL/hr over 60 Minutes Intravenous Every 12 hours 03/24/12 1423 03/27/12 0541   03/24/12 1600   vancomycin (VANCOCIN) 1,500 mg in sodium chloride 0.9 % 500 mL IVPB        1,500 mg 250 mL/hr over 120 Minutes Intravenous  Once 03/24/12 1423 03/24/12 1856   03/24/12 1530   cefTAZidime (FORTAZ) 1 g in dextrose 5 % 50 mL IVPB        1 g 100 mL/hr over 30 Minutes Intravenous Every 8 hours 03/24/12 1423     03/24/12 1400   vancomycin (VANCOCIN) IVPB 1000 mg/200 mL premix  Status:  Discontinued        1,000 mg 200 mL/hr over 60 Minutes Intravenous Every 12 hours 03/24/12 1353 03/24/12 1400   03/03/12 1800   vancomycin (VANCOCIN) 1,750 mg in sodium chloride 0.9 % 500 mL IVPB  Status:  Discontinued        1,750 mg 250 mL/hr over 120 Minutes Intravenous Every 12 hours 03/03/12 0628 03/03/12 0918   02/29/12 1900   micafungin (MYCAMINE) 100 mg in sodium chloride 0.9 % 100 mL IVPB  Status:  Discontinued  100 mg 100 mL/hr over 1 Hours Intravenous Every 24 hours 02/29/12 1754 03/01/12 0933   02/29/12 1730   vancomycin (VANCOCIN) 1,250 mg in sodium chloride 0.9 % 250 mL IVPB  Status:  Discontinued        1,250 mg 166.7 mL/hr over 90 Minutes Intravenous Every 12 hours 02/29/12 1644 03/03/12 0628   02/29/12 1715   vancomycin (VANCOCIN) IVPB 1000 mg/200 mL premix  Status:  Discontinued        1,000 mg 200 mL/hr over 60 Minutes Intravenous  Once 02/29/12 1637 02/29/12 1647   02/29/12 1645   piperacillin-tazobactam (ZOSYN) IVPB 3.375 g  Status:  Discontinued        3.375 g 100 mL/hr over 30 Minutes Intravenous  Once 02/29/12 1637 02/29/12 1641   02/28/12 1800   piperacillin-tazobactam (ZOSYN) IVPB 3.375 g  Status:  Discontinued        3.375 g 12.5 mL/hr over 240 Minutes Intravenous 4 times per day 02/28/12 1454 02/28/12 1510   02/28/12 1800   piperacillin-tazobactam (ZOSYN) IVPB 3.375 g  Status:  Discontinued        3.375  g 12.5 mL/hr over 240 Minutes Intravenous Every 8 hours 02/28/12 1510 03/14/12 1700   02/25/12 1109   dextrose 5 % with cefOXitin (MEFOXIN) ADS Med     Comments: HOLLIE, CHRISTINA: cabinet override         02/25/12 1109 02/25/12 2314   02/25/12 1100   cefOXitin (MEFOXIN) 1 g in dextrose 5 % 50 mL IVPB  Status:  Discontinued        1 g 100 mL/hr over 30 Minutes Intravenous 60 min pre-op 02/25/12 1100 02/25/12 1542          Assessment/Plan: s/p Procedure(s) (LRB): DEBRIDEMENT CLOSURE/ABDOMINAL WOUND (N/A) Status post exploratory laparotomy and lysis of adhesions for SBO at any [facility].  Status post transfer to [facility] for respiratory failure requiring tracheostomy and subsequent abdominal exploration and wound debridement and closure for abdominal wound dehiscence.  Afebrile and tolerating TF, +BM - ?PEG tube, tolerating TFs through panda, Continue wound VAC, We appreciate wound care nurse doing measurements for care management.   Per Melody Austin:   Measurement:13cm x 2.5cm x 2cm no tunneling or underminning  Wound bed:95% beefy granulation tissue, 5% yellow slough in deepest part of wound base  Drainage (amount, consistency, odor) serosanguinous in canister, no odor noted with dressing change  Periwound:intact  Dressing procedure/placement/frequency: 4pc of black granufoam used to fill wound bed, tucked under retention sutures. Mushroom of black granufoam used to protect pt from [facility] pad. Ostomy paste used over retention bar/sutures at sharp points to avoid disruption of seal of NPWT. Seal obtained at without problems. Pt received pain meds prior to dressing change today, he tolerated the dressing change well.   VDRF/PNA - per CCM  Plan: Patient needing long term nutritional options given inability to swallow right now, currently has PANDA and tolerating TFs via this.  Family considering PEG tube although this will be difficult given his recent multiple abdominal  surgeries.  IR to decide if this is feasible in Radiology.  Will follow along.  LOS: 36 days    Ranay Ketter 03/31/2012

## 2012-03-31 NOTE — Progress Notes (Signed)
Nutrition Follow-up  Per chart review, pt will likely need PEG for long-term nutrition support. Family considering PEG tube although this will be difficult given his recent multiple abdominal surgeries. IR to decide if this is feasible in Radiology. Per chart, blood in stools has almost completely resolved.  RN reports pt continues to have very watery bowel movements. Discussed option to change formula to Jevity, a fiber-containing formula, in order to bulk up stools. RN in agreement of plan.  Diet Order:  NPO TF: Osmolite 1.2 at 60 ml/hr with Prostat 30 ml BID provides: 1928 kcal, 110 grams protein, 1181 ml free water  Free water of 200 ml q 4 hours. Provides additional 1200 ml fluid daily.  Meds: Scheduled Meds:   . albuterol  2.5 mg Nebulization QID  . antiseptic oral rinse  15 mL Mouth Rinse QID  . budesonide  0.5 mg Nebulization BID  . cefTAZidime (FORTAZ)  IV  1 g Intravenous Q8H  . chlorhexidine  15 mL Mouth Rinse BID  . enoxaparin  40 mg Subcutaneous Q24H  . escitalopram  10 mg Oral Daily  . feeding supplement  30 mL Per Tube BID  . fentaNYL  100 mcg Intravenous Once  . free water  200 mL Per Tube Q4H  . levofloxacin (LEVAQUIN) IV  750 mg Intravenous Q24H  . metoprolol  2.5 mg Intravenous Q8H  . midazolam  2 mg Intravenous Once  . pantoprazole sodium  40 mg Per Tube QHS  . potassium chloride  40 mEq Per Tube Once  . sodium chloride  3 mL Intravenous Q12H   Continuous Infusions:   . dextrose 5 % and 0.45% NaCl 500 mL (03/29/12 2002)  . feeding supplement (OSMOLITE 1.2 CAL) 1,000 mL (03/29/12 0940)   PRN Meds:.albuterol, ALPRAZolam, bisacodyl, fentaNYL, hydrALAZINE, phenol  Labs:  CMP     Component Value Date/Time   NA 138 03/31/2012 0347   K 3.6 03/31/2012 0347   CL 100 03/31/2012 0347   CO2 30 03/31/2012 0347   GLUCOSE 94 03/31/2012 0347   BUN 13 03/31/2012 0347   CREATININE 0.65 03/31/2012 0347   CALCIUM 8.7 03/31/2012 0347   PROT 6.1 03/29/2012 0420   ALBUMIN 1.9*  03/29/2012 0420   AST 13 03/29/2012 0420   ALT 10 03/29/2012 0420   ALKPHOS 49 03/29/2012 0420   BILITOT 0.3 03/29/2012 0420   GFRNONAA >90 03/31/2012 0347   GFRAA >90 03/31/2012 0347    Intake/Output Summary (Last 24 hours) at 03/31/12 0946 Last data filed at 03/31/12 0800  Gross per 24 hour  Intake   2795 ml  Output    100 ml  Net   2695 ml   Weight Status:  73.3 kg - stable  Body mass index is 20.75 kg/(m^2). Weight is WNL  Re-estimated needs:  1850-1950 kcals, 105-120 grams protein daily  Nutrition Dx:  Inadequate oral intake - ongoing.  Goal:  Intake to meet 90-100% of estimated nutrition needs, met  Intervention:  Recommend fiber-containing formula to bulk stools. Initiate Jevity 1.2 at 25 ml/hr. Advance by 10 ml/hr every 4 hours to goal of 55 ml/hr. Continue 30 ml Prostat via tube TID. This regimen will provide 1884 kcal, 118 grams protein, 1065 ml free water. RD to continue to follow.  Monitor:  Weights, labs, PO intake, I/O's, vent use  Adair Laundry Pager #:  [phone]

## 2012-03-31 NOTE — Progress Notes (Signed)
Name: Michael Ellison MRN: [id number] DOB: 09-15-1946    LOS: 36  PULMONARY / CRITICAL CARE MEDICINE  Pt Profile:   66 y/o male smoker admitted to [facility] 02/24/2012 with n/v and abdominal pain due to SBO due to adhesions.  Had ex lap with SB resection 5/10.  Developed delirium post-op.  Transferred to [facility] 5/14 for respiratory failure requiring intubation.  Developed abdominal fascial dehiscence and taken back to OR 5/16.  Failed extubation, and required tracheostomy 5/22. PMHx ETOH, HTN, Prostate cancer  Interval hx:  5/16: OR for fascial dehiscence repair- closed with wound vac 5/18- failed weaning 5/19- extubated 5/21- re-intubated for hypercarbia 5/22-improved co2 and neurostatus, pos almost 2 liters 5/22 SVT>>resolved spontaneously 5/30 -off vent for 24 hours, planned Panda placement 6/1> up in chair, improved. 6/3- desat, increased WOB, vent 6/3- leak trach, improved with extra long 6/5: FEES eval: failed, still showing evidence of aspiration.   Subjective: Tolerated ATC yesterday 6/13 Currently on PSV 7/5  Vital Signs: Temp:  [97.5 F (36.4 C)-98.9 F (37.2 C)] 98.5 F (36.9 C) (06/14 0814) Pulse Rate:  [80-93] 80  (06/14 0814) Resp:  [15-27] 26  (06/14 0814) BP: (111-132)/(69-83) 119/77 mmHg (06/14 0814) SpO2:  [96 %-100 %] 97 % (06/14 0814) FiO2 (%):  [40 %] 40 % (06/14 0814) Weight:  [73.3 kg (161 lb 9.6 oz)] 73.3 kg (161 lb 9.6 oz) (06/14 0057)  Physical Examination: General - Alert and interactive. HEENT - trach site clean Cardiac - s1s2 regular, no murmur Chest - resps even non labored, back on vent, scattered rhonchi Abd - midline open wound with wound vac in place, +bs Ext - no edema Neuro - awake and interacting, moves all ext  CBC  Lab 03/30/12 0400 03/29/12 0420 03/28/12 0949  HGB 9.8* 9.6* 10.3*  HCT 30.9* 30.5* 32.9*  WBC 10.1 9.6 10.2  PLT 193 201 246   BMET  Lab 03/31/12 0347 03/30/12 0400 03/29/12 0420 03/28/12 0345 03/25/12 0347  NA 138  137 139 144 145  K 3.6 3.3* -- -- --  CL 100 100 104 108 111  CO2 30 30 30 28 24   GLUCOSE 94 107* 111* 115* 148*  BUN 13 12 14 17  48*  CREATININE 0.65 0.55 0.61 0.60 1.26  CALCIUM 8.7 8.4 8.2* 8.3* 8.6  MG -- 1.7 -- -- --  PHOS -- 2.7 -- -- --   Lab Results  Component Value Date   ALT 10 03/29/2012   AST 13 03/29/2012   ALKPHOS 49 03/29/2012   BILITOT 0.3 03/29/2012    Intake/Output Summary (Last 24 hours) at 03/31/12 1102 Last data filed at 03/31/12 0800  Gross per 24 hour  Intake   2655 ml  Output    100 ml  Net   2555 ml   Medications reviewed   ASSESSMENT AND PLAN  PULMONARY ETT:  5/14>>>5/19 >>> 5/21 Trach (DF) 5/22>> 5/14 CT chest>> b/l effusions and basilar ATX/consolidation, severe bullous lung disease with emphysema in upper lobes b/l. 6/7 PCXR: no sig change in bibasilar volume loss and atx. 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  Failed extubation, and s/p trach 5/22. Xtra long 6 cuffed 6/3>>>  Has not been able to come off vent support. Think deconditioning and Pneumonia contributing to this. He has both Klebsiella and Stenotrophomonas. Making some progress after apparent mucous plugging in the last week Plan: -Cont scheduled BDs, will continue ICS -  continue aggressive ATC and PSV trials.  -Cont vibravest. -See ID section -Repeat cxr in am  CARDIOVASCULAR left [place] CVL 5/14>>> 5/31 Rt femoral aline 5/14>>>out  HTN, Edema, Intermittent tachycardia.  Plan: KVO IVF. Follow intermittent BMP  RENAL  Hypernatremia, better Plan: -UOP is low but renal function is intact begging the question of accuracy.  Will hold lasix and treat clinically, patient does not appear to be terribly fluid overloaded.  GASTROINTESTINAL  5/09 CT abd/pelvis>>SBO, RLQ transition point, small Rt inguinal hernia, diverticulosis 5/14 CT abd/pelvis>>ileus ABD Wound Vac  5/16>>  SBO s/p lap with SB resection 5/10 complicated by post-op ileus and fascial dehiscence, and return to OR 5/16. Chronic pain. Plan: -Post-op care per CCS, appreciate wound care -discussed w Dr Janee Morn >> open PEG would be very complicated; IR eval pending to see if he can have a guided PEG. I believe there is still some hope that he will be able to be vent free and to take PO  Dysphagia w/ evidence of penetration and probable aspiration observed on FEES completed 6/5.  P: -Resumed TF  -consulted IR for possible PEG. If unable to do then will have to decide with CCS whether surgical PEG is possible   Lower GI Bleed: anemia not excessive. Plan. Intermittent CBCs  HEMATOLOGIC  Anemia of critical illness.   Plan: -transfuse for Hb < 7  INFECTIOUS  Cultures: BCX2 5/14 >>1/2 gram neg rod>>> 1/2 Klebsiella  UC 5/14 >>Enterococcus (sensitive to ampicillin) Sputum 5/14>> Klebsiella pneumoniae (pan sensitive) Wound 5/14>>Klebsiella Pneumoniae (pan sensitive) & Enterobacter Cloacae (resistant to ancef, cefoxitin) BC X2 5/20>>>ng Sputum 5/22>>>klebs, enterobacter Sputum 6/7>KLEBSIELLA PNEUMONIAE and STENOTROPHOMONAS MALTOPHILIA.  Antibiotics: vanc 5/14 >> 5/17 Zosyn 5/13 >>5/28 Vanc 6/9>>>6/11 Ceftaz 6/9 (klebsiella)>>> levaquin (stenotrophomonas) 6/11>>>  Wound infection, aspiration pneumonia, UTI. Plan: -levaquin, cont ceftaz (abx day 6 on 6/14)  ENDOCRINE CBG (last 3)  Hyperglycemia., hypoglycemic on 6/6 Plan: -Cont cbgs  NEUROLOGIC  Acute metabolic encephalopathy 2nd to sepsis, respiratory failure, post-op pain, and ETOH.  Much improved.  Intolerant of lorazepam (does okay with midazolam). Plan: -PRN fentanyl for pain  -Limit benzo's and haldol -PT/OT for ambulation -To chair daily as tolerated  Depression / Anxiety Plan: -Began lexapro  5/31, hx of depression -was not on meds PTA (had stopped taking), ETOH abuse  DISPO - has no LTAC benefits, per social  work and family.    Levy Pupa, MD, PhD 03/31/2012, 11:02 AM [facility] [phone] or if no answer [phone]

## 2012-03-31 NOTE — Progress Notes (Signed)
Physical Therapy Treatment Patient Details Name: Michael Ellison MRN: [id number] DOB: 1946/01/15 Today's Date: 03/31/2012 Time: 1308-6578 PT Time Calculation (min): 27 min  PT Assessment / Plan / Recommendation Comments on Treatment Session  Continuing to do well with amb; distance today limiting only by need for BSC, and VAC change post amb    Follow Up Recommendations  Skilled nursing facility;Supervision/Assistance - 24 hour    Barriers to Discharge        Equipment Recommendations  Defer to next venue    Recommendations for Other Services OT consult  Frequency Min 3X/week   Plan Discharge plan remains appropriate;Frequency remains appropriate    Precautions / Restrictions Precautions Precautions: Fall Precaution Comments: vent Restrictions Weight Bearing Restrictions: No   Pertinent Vitals/Pain no apparent distress     Mobility  Bed Mobility Bed Mobility: Supine to Sit;Sitting - Scoot to Edge of Bed Rolling Right: 5: Supervision Right Sidelying to Sit: 4: Min guard;With rails Supine to Sit: 4: Min guard Sitting - Scoot to Edge of Bed: 5: Supervision Sit to Supine: 4: Min guard;HOB flat;With rail Details for Bed Mobility Assistance: assist for management of lines only Transfers Transfers: Sit to Stand;Stand to Sit Sit to Stand: 4: Min assist;From bed;From chair/3-in-1 Stand to Sit: 4: Min guard Details for Transfer Assistance: Cues to scoot more fully to EOB and to edge of 3in1 to make sit to stend easier; cues for hand placement; tends to pull up on RW Ambulation/Gait Ambulation/Gait Assistance: 4: Min guard Ambulation Distance (Feet): 60 Feet (amb distance limited by eminint need for [facility]) Assistive device: Rolling walker Ambulation/Gait Assistance Details: Did not need chair behind; Pt managed 180 degree turn well with RW and all of his lines; RT managing vent throughout amb Gait Pattern: Step-through pattern;Decreased stride length Gait velocity: Slow cadence     Exercises     PT Diagnosis:    PT Problem List:   PT Treatment Interventions:     PT Goals Acute Rehab PT Goals Time For Goal Achievement: 04/10/12 Potential to Achieve Goals: Good Pt will go Supine/Side to Sit: with modified independence PT Goal: Supine/Side to Sit - Progress: Progressing toward goal Pt will Sit at Edge of Bed: Independently;6-10 min;with no upper extremity support PT Goal: Sit at Edge Of Bed - Progress: Progressing toward goal Pt will go Sit to Stand: Independently;with upper extremity assist PT Goal: Sit to Stand - Progress: Progressing toward goal Pt will go Stand to Sit: Independently;with upper extremity assist PT Goal: Stand to Sit - Progress: Progressing toward goal Pt will Transfer Bed to Chair/Chair to Bed: with modified independence PT Transfer Goal: Bed to Chair/Chair to Bed - Progress: Progressing toward goal Pt will Ambulate: 51 - 150 feet;with least restrictive assistive device;with supervision PT Goal: Ambulate - Progress: Progressing toward goal  Visit Information  Last PT Received On: 03/31/12 Assistance Needed: +2 (lines, leads, RT for vent)    Subjective Data  Subjective: on vent, mouthing words to answer orientation and needs Patient Stated Goal: To go home   Cognition  Overall Cognitive Status: Appears within functional limits for tasks assessed/performed Difficult to assess due to: Tracheostomy Arousal/Alertness: Awake/alert Orientation Level: Appears intact for tasks assessed Behavior During Session: [facility] for tasks performed    Balance     End of Session PT - End of Session Equipment Utilized During Treatment: Gait belt Activity Tolerance: Patient tolerated treatment well Patient left: in bed;with call bell/phone within reach;with nursing in room (preparing to have his VAC  dressing changed) Nurse Communication: Mobility status    Van Clines [facility] [place], [place] [phone]  03/31/2012, 11:45 AM

## 2012-03-31 NOTE — Progress Notes (Signed)
CSW in frequent contact with pt daughters regarding disposition. Daughters are hopeful for placement at either [facility] or [facility], but are understanding of possible out of state placement. CSW has explained all options and possible progressions as pt makes efforts toward weaning.  CSW sent clinicals to [facility] today.  Angelia Mould will follow to facilitate d/c planning.  Baxter Flattery, MSW [phone]

## 2012-03-31 NOTE — Consult Note (Signed)
WOC follow up Wound type: surgical Wound bed:95% beefy red granulation tissue, 5% brown slough at distal base of wound Drainage (amount, consistency, odor)  Periwound:intact, however retention sutures are causing some device related pressure ulcerations. Would ask surgery to  eval at next dressing change to remove these.  Dressing procedure/placement/frequency:3 pc of black granufoam used to fill wound bed under retention sutures, with mushroom of black granufoam used under TRAC pad to prevent injury from this due to wound size decreased smaller than dimensions of TRAC pad.  Seal obtained at without problems, pt tolerated without problems.  Pt had been premedicated prior to dressing change. Noted ostomy strip paste used to cover edges of retention sutures and tops of suture bars to protect sharp edges from disruption of seal.  WOC will follow along with you for assistance with NPWT dressings as needed.  Thanks  Jaelon Gatley [employer], CWOCN [phone])

## 2012-04-01 NOTE — Progress Notes (Signed)
Patient ID: Michael Ellison, male   DOB: 1946/05/29, 66 y.o.   MRN: [id number]  No significant changes. VAC changed yesterday. Apparently, IR will decide about feasibility of placing feeding tube.  Will follow.  Wilmon Arms. Corliss Skains, MD, [facility]  04/01/2012 8:56 AM

## 2012-04-01 NOTE — Plan of Care (Signed)
Problem: Phase II Progression Outcomes Goal: No active bleeding Outcome: Not Progressing Still experiencing mucous, watery, red-streaked stools.

## 2012-04-01 NOTE — Progress Notes (Signed)
Talked to CCM MD and Dr. Harlon Flor regarding frequent bloody, mucousy, watery stools (has had 3 during shift).  Orders received to collect stool to test for C.diff.  Salomon Mast, RN.

## 2012-04-01 NOTE — Progress Notes (Signed)
Name: SHAMARION COOTS MRN: [id number] DOB: Dec 29, 1945    LOS: 37  PULMONARY / CRITICAL CARE MEDICINE  Pt Profile:   66 y/o male smoker admitted to [facility] 02/24/2012 with n/v and abdominal pain due to SBO due to adhesions.  Had ex lap with SB resection 5/10.  Developed delirium post-op.  Transferred to [facility] 5/14 for respiratory failure requiring intubation.  Developed abdominal fascial dehiscence and taken back to OR 5/16.  Failed extubation, and required tracheostomy 5/22. PMHx ETOH, HTN, Prostate cancer  Interval hx:  5/16: OR for fascial dehiscence repair- closed with wound vac 5/18- failed weaning 5/19- extubated 5/21- re-intubated for hypercarbia 5/22-improved co2 and neurostatus, pos almost 2 liters 5/22 SVT>>resolved spontaneously 5/30 -off vent for 24 hours, planned Panda placement 6/1> up in chair, improved. 6/3- desat, increased WOB, vent 6/3- leak trach, improved with extra long 6/5: FEES eval: failed, still showing evidence of aspiration.   Subjective: Currently on psv 7 and moving tv 500+.  No increased wob.  No new issues overnight except had a few bloody stools.  I have asked nursing to notify general surgery.  Will check cbc in am to f/u .    Vital Signs: Temp:  [98.4 F (36.9 C)-98.7 F (37.1 C)] 98.6 F (37 C) (06/15 0905) Pulse Rate:  [80-94] 80  (06/15 0522) Resp:  [15-26] 20  (06/15 0522) BP: (105-123)/(74-82) 113/78 mmHg (06/15 0905) SpO2:  [96 %-100 %] 100 % (06/15 0522) FiO2 (%):  [40 %] 40 % (06/15 0905) Weight:  [75.2 kg (165 lb 12.6 oz)] 75.2 kg (165 lb 12.6 oz) (06/15 0500)  Physical Examination: General - Alert and interactive. HEENT - trach site clean Cardiac - s1s2 regular, no murmur Chest -a few scattered crackles, no wheezing, good airflow. Abd - midline open wound with wound vac in place, +bs Ext - no edema Neuro - awake and interacting, moves all ext  CBC  Lab 03/30/12 0400 03/29/12 0420 03/28/12 0949  HGB 9.8* 9.6* 10.3*  HCT 30.9*  30.5* 32.9*  WBC 10.1 9.6 10.2  PLT 193 201 246   BMET  Lab 03/31/12 0347 03/30/12 0400 03/29/12 0420 03/28/12 0345  NA 138 137 139 144  K 3.6 3.3* -- --  CL 100 100 104 108  CO2 30 30 30 28   GLUCOSE 94 107* 111* 115*  BUN 13 12 14 17   CREATININE 0.65 0.55 0.61 0.60  CALCIUM 8.7 8.4 8.2* 8.3*  MG -- 1.7 -- --  PHOS -- 2.7 -- --   Lab Results  Component Value Date   ALT 10 03/29/2012   AST 13 03/29/2012   ALKPHOS 49 03/29/2012   BILITOT 0.3 03/29/2012    Intake/Output Summary (Last 24 hours) at 04/01/12 1108 Last data filed at 04/01/12 1100  Gross per 24 hour  Intake   2310 ml  Output    221 ml  Net   2089 ml   Medications reviewed   ASSESSMENT AND PLAN  Acute hypoxic/hypercapnic respiratory failure 2nd to delirium, aspiration pneumonia, 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  Failed extubation, and s/p trach 5/22. Xtra long 6 cuffed 6/3>>>  Has not been able to come off vent support. Think deconditioning and Pneumonia contributing to this. He has both Klebsiella and Stenotrophomonas. Making some progress after apparent mucous plugging in the last week.,  He is comfortable this am, and tolerating PSV. Plan: -Cont scheduled BDs, will continue ICS -continue aggressive ATC and PSV trials.  -Cont vibravest. -no  change in abx.   SBO s/p lap with SB resection 5/10 complicated by post-op ileus and fascial dehiscence, and return to OR 5/16. Chronic pain. Plan: -Post-op care per CCS, appreciate wound care -discussed w Dr Janee Morn >> open PEG would be very complicated; IR eval pending to see if he can have a guided PEG.   Will check cbc in am given blood in stool noted this am.

## 2012-04-02 LAB — BASIC METABOLIC PANEL
BUN: 11 mg/dL (ref 6–23)
Chloride: 99 mEq/L (ref 96–112)
Creatinine, Ser: 0.54 mg/dL (ref 0.50–1.35)
GFR calc Af Amer: >90 mL/min (ref >90)
Glucose, Bld: 96 mg/dL (ref 70–99)
Potassium: 3.4 mEq/L — ABNORMAL LOW (ref 3.5–5.1)

## 2012-04-02 LAB — CBC
HCT: 31 % — ABNORMAL LOW (ref 39.0–52.0)
Hemoglobin: 10 g/dL — ABNORMAL LOW (ref 13.0–17.0)
MCH: 27.4 pg (ref 26.0–34.0)
MCHC: 32.3 g/dL (ref 30.0–36.0)
MCV: 84.9 fL (ref 78.0–100.0)
RDW: 14.3 % (ref 11.5–15.5)

## 2012-04-02 MED ORDER — SODIUM CHLORIDE 0.9 % IV SOLN
INTRAVENOUS | Status: DC
  Administered 2012-04-02: 1000 mL via INTRAVENOUS
  Administered 2012-04-04: 10 mL via INTRAVENOUS
  Administered 2012-04-08: 500 mL via INTRAVENOUS

## 2012-04-02 MED ORDER — POTASSIUM CHLORIDE 20 MEQ/15ML (10%) PO LIQD
40.0000 meq | Freq: Once | ORAL | Status: AC
  Administered 2012-04-02: 40 meq
  Filled 2012-04-02 (×2): qty 30

## 2012-04-02 NOTE — Progress Notes (Signed)
ANTIBIOTIC CONSULT NOTE - FOLLOW UP  Pharmacy Consult for ceftazidime Indication: pneumonia  Labs:  [facility] 04/02/12 0012 03/31/12 0347  WBC 8.6 --  HGB 10.0* --  PLT 207 --  LABCREA -- --  CREATININE 0.54 0.65   Estimated Creatinine Clearance: 97.9 ml/min (by C-G formula based on Cr of 0.54). No results found for this basename: VANCOTROUGH:2,VANCOPEAK:2,VANCORANDOM:2,GENTTROUGH:2,GENTPEAK:2,GENTRANDOM:2,TOBRATROUGH:2,TOBRAPEAK:2,TOBRARND:2,AMIKACINPEAK:2,AMIKACINTROU:2,AMIKACIN:2, in the last 72 hours   Assessment: D10 ceftazidime, current dose 1g IV q8h D6 levaquin, current dose 750mg  IV q24h  Indication: stenotrophomonas/klebsiella PNA. Both are sensitive to levaquin.   Status: afebrile, wbc now wnl, renal function stable. On trach collar at this moment and albuterol and pulmicort nebulizer treatments. Noted bloody, frequent stools over weekend, but cdiff pcr negative.  Plan:  1. Would recommend to continue levaquin at 750mg  IV q24 and discontinue fortaz at this point. Current dose of fortaz is appropriate per renal function. 2. Please evaluate need for Lovenox vs SCDs. Hgb 9.8<<12.6 and platelets 193 <<440. Pt having bloody stools.  Frutoso Chase, PharmD pgr [phone] 04/02/2012, 9:20 AM  Thank you for allowing pharmacy to be part of this patients care team.

## 2012-04-02 NOTE — Progress Notes (Signed)
No complaints Vac in place IR deciding about feeding access Hct stable today

## 2012-04-02 NOTE — Progress Notes (Signed)
Name: Michael Ellison MRN: [id number] DOB: 1946/05/21    LOS: 38  PULMONARY / CRITICAL CARE MEDICINE  Pt Profile:   66 y/o male smoker admitted to [facility] 02/24/2012 with n/v and abdominal pain due to SBO due to adhesions.  Had ex lap with SB resection 5/10.  Developed delirium post-op.  Transferred to [facility] 5/14 for respiratory failure requiring intubation.  Developed abdominal fascial dehiscence and taken back to OR 5/16.  Failed extubation, and required tracheostomy 5/22. PMHx ETOH, HTN, Prostate cancer  Interval hx:  5/16: OR for fascial dehiscence repair- closed with wound vac 5/18- failed weaning 5/19- extubated 5/21- re-intubated for hypercarbia 5/22-improved co2 and neurostatus, pos almost 2 liters 5/22 SVT>>resolved spontaneously 5/30 -off vent for 24 hours, planned Panda placement 6/1> up in chair, improved. 6/3- desat, increased WOB, vent 6/3- leak trach, improved with extra long 6/5: FEES eval: failed, still showing evidence of aspiration.   Subjective: Currently on trach collar with PMV and conversant.  No increased wob.  No new issues overnight except had a few bloody stools yesterday.  Nurse describes as liquid with mucus and a few streaks of blood.  No clots.  Cdiff pending, and hgb stable today.    Vital Signs: Temp:  [98 F (36.7 C)-98.6 F (37 C)] 98.3 F (36.8 C) (06/16 1145) Pulse Rate:  [76-98] 90  (06/16 1149) Resp:  [17-31] 19  (06/16 1149) BP: (109-126)/(68-89) 109/72 mmHg (06/16 1149) SpO2:  [87 %-100 %] 97 % (06/16 1149) FiO2 (%):  [40 %] 40 % (06/16 1149)  Physical Examination: General - Alert and interactive. HEENT - trach site clean Cardiac - regular, a few ectopics Chest -a few scattered crackles, no wheezing, good airflow. Abd - protuberant and tight, bs+ Ext - no edema Neuro - awake and interacting, moves all ext  CBC  Lab 04/02/12 0012 03/30/12 0400 03/29/12 0420  HGB 10.0* 9.8* 9.6*  HCT 31.0* 30.9* 30.5*  WBC 8.6 10.1 9.6  PLT 207 193  201   BMET  Lab 04/02/12 0012 03/31/12 0347 03/30/12 0400 03/29/12 0420 03/28/12 0345  NA 134* 138 137 139 144  K 3.4* 3.6 -- -- --  CL 99 100 100 104 108  CO2 28 30 30 30 28   GLUCOSE 96 94 107* 111* 115*  BUN 11 13 12 14 17   CREATININE 0.54 0.65 0.55 0.61 0.60  CALCIUM 8.3* 8.7 8.4 8.2* 8.3*  MG -- -- 1.7 -- --  PHOS -- -- 2.7 -- --   Lab Results  Component Value Date   ALT 10 03/29/2012   AST 13 03/29/2012   ALKPHOS 49 03/29/2012   BILITOT 0.3 03/29/2012    Intake/Output Summary (Last 24 hours) at 04/02/12 1215 Last data filed at 04/02/12 1100  Gross per 24 hour  Intake   1095 ml  Output   1130 ml  Net    -35 ml   Medications reviewed   ASSESSMENT AND PLAN  Acute hypoxic/hypercapnic respiratory failure 2nd to delirium, aspiration pneumonia, 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  Failed extubation, and s/p trach 5/22. Xtra long 6 cuffed 6/3>>>  Has not been able to come off vent support. Think deconditioning and Pneumonia contributing to this. He has both Klebsiella and Stenotrophomonas. Making some progress after apparent mucous plugging in the last week.,  He is comfortable this am, and tolerating trach collar Plan: -Cont scheduled BDs, will continue ICS -continue aggressive ATC and PSV trials.  -Cont vibravest. -no change in  abx.  If Cdiff returns +, will have to critically consider need and selection.   SBO s/p lap with SB resection 5/10 complicated by post-op ileus and fascial dehiscence, and return to OR 5/16. Chronic pain. Plan: -Post-op care per CCS, appreciate wound care -discussed w Dr Janee Morn >> open PEG would be very complicated; IR eval pending to see if he can have a guided PEG.   Mild diarrhea with blood streaks: Small quantity, cdiff pending, CBC stable.  If worsens, will need to change lovenox to SCD.  He is at very high risk for TED, so would like to leave on lovenox if possible.

## 2012-04-03 ENCOUNTER — Inpatient Hospital Stay (HOSPITAL_COMMUNITY): Payer: Medicare Other

## 2012-04-03 ENCOUNTER — Encounter (HOSPITAL_COMMUNITY): Payer: Self-pay | Admitting: Radiology

## 2012-04-03 LAB — BASIC METABOLIC PANEL
BUN: 9 mg/dL (ref 6–23)
Calcium: 8.5 mg/dL (ref 8.4–10.5)
Creatinine, Ser: 0.53 mg/dL (ref 0.50–1.35)
GFR calc Af Amer: >90 mL/min (ref >90)
GFR calc non Af Amer: >90 mL/min (ref >90)
Glucose, Bld: 105 mg/dL — ABNORMAL HIGH (ref 70–99)

## 2012-04-03 LAB — CBC
Hemoglobin: 10.4 g/dL — ABNORMAL LOW (ref 13.0–17.0)
MCH: 27.1 pg (ref 26.0–34.0)
MCHC: 32.1 g/dL (ref 30.0–36.0)
Platelets: 221 10*3/uL (ref 150–400)
RDW: 14.4 % (ref 11.5–15.5)

## 2012-04-03 LAB — PROTIME-INR: Prothrombin Time: 14.9 seconds (ref 11.6–15.2)

## 2012-04-03 MED ORDER — POTASSIUM CHLORIDE 20 MEQ/15ML (10%) PO LIQD
40.0000 meq | Freq: Once | ORAL | Status: DC
  Filled 2012-04-03: qty 30

## 2012-04-03 MED ORDER — METOPROLOL TARTRATE 25 MG/10 ML ORAL SUSPENSION
12.5000 mg | Freq: Two times a day (BID) | ORAL | Status: DC
  Administered 2012-04-03 – 2012-04-18 (×32): 12.5 mg
  Filled 2012-04-03 (×35): qty 5

## 2012-04-03 MED ORDER — POTASSIUM CHLORIDE 20 MEQ/15ML (10%) PO LIQD
40.0000 meq | Freq: Once | ORAL | Status: AC
  Administered 2012-04-03: 40 meq
  Filled 2012-04-03: qty 30

## 2012-04-03 NOTE — Progress Notes (Signed)
Name: Michael Ellison MRN: [id number] DOB: [birth year]    LOS: 39  PULMONARY / CRITICAL CARE MEDICINE  Pt Profile:   66 y/o male smoker admitted to [facility] 02/24/2012 with n/v and abdominal pain due to SBO due to adhesions.  Had ex lap with SB resection 5/10.  Developed delirium post-op.  Transferred to [facility] 5/14 for respiratory failure requiring intubation.  Developed abdominal fascial dehiscence and taken back to OR 5/16.  Failed extubation, and required tracheostomy 5/22. PMHx ETOH, HTN, Prostate cancer  Interval hx:  5/16: OR for fascial dehiscence repair- closed with wound vac 5/18- failed weaning 5/19- extubated 5/21- re-intubated for hypercarbia 5/22-improved co2 and neurostatus, pos almost 2 liters 5/22 SVT>>resolved spontaneously 5/30 -off vent for 24 hours, planned Panda placement 6/1 - up in chair, improved. 6/3 - desat, increased WOB, vent 6/3 - leak trach, improved with extra long 6/5 - FEES eval: failed, still showing evidence of aspiration.  6/16 - Few liquid stools with streaks of blood,  H/h stable.  (has been having mucoid / bloody streaked stool -surgeons aware)  Subjective: RT reports patient required vent support last night due to increased secretions.  Now back on ATC.  Patient with no acute c/o's   Vital Signs: Temp:  [97.6 F (36.4 C)-98.5 F (36.9 C)] 98.3 F (36.8 C) (06/17 0329) Pulse Rate:  [81-93] 91  (06/17 0900) Resp:  [20-26] 21  (06/17 0900) BP: (111-119)/(73-83) 119/73 mmHg (06/17 0329) SpO2:  [93 %-100 %] 95 % (06/17 1233) FiO2 (%):  [35 %-40 %] 35 % (06/17 1233)  Physical Examination: General - Alert and interactive. HEENT - trach site clean, #6.0 xlt Cardiac - regular, no m/r/g Chest -a few scattered crackles, no wheezing, good airflow. Abd - round/soft, bs+ Ext - no edema Neuro - awake and interacting, moves all ext  CBC  Lab 04/03/12 0400 04/02/12 0012 03/30/12 0400  HGB 10.4* 10.0* 9.8*  HCT 32.4* 31.0* 30.9*  WBC 7.9 8.6 10.1    PLT 221 207 193   BMET  Lab 04/03/12 0400 04/02/12 0012 03/31/12 0347 03/30/12 0400 03/29/12 0420  NA 134* 134* 138 137 139  K 3.3* 3.4* -- -- --  CL 100 99 100 100 104  CO2 28 28 30 30 30   GLUCOSE 105* 96 94 107* 111*  BUN 9 11 13 12 14   CREATININE 0.53 0.54 0.65 0.55 0.61  CALCIUM 8.5 8.3* 8.7 8.4 8.2*  MG -- -- -- 1.7 --  PHOS -- -- -- 2.7 --   Lab Results  Component Value Date   ALT 10 03/29/2012   AST 13 03/29/2012   ALKPHOS 49 03/29/2012   BILITOT 0.3 03/29/2012    Intake/Output Summary (Last 24 hours) at 04/03/12 1239 Last data filed at 04/03/12 1200  Gross per 24 hour  Intake   1745 ml  Output    200 ml  Net   1545 ml   Medications reviewed   ASSESSMENT AND PLAN  Acute hypoxic/hypercapnic respiratory failure 2nd to delirium, aspiration pneumonia, 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  Failed extubation, and s/p trach 5/22. Xtra long 6 cuffed 6/3>>>   Has not been able to come off vent support. Think deconditioning and Pneumonia contributing to this. He has both Klebsiella and Stenotrophomonas. Making some progress after apparent mucous plugging.   6/17 ATC with no acute distress.   Plan: -Cont scheduled BDs, will continue ICS -continue aggressive ATC as tolerated with PRN vent -Cont vibravest.  SBO s/p lap with SB resection 5/10 complicated by post-op ileus and fascial dehiscence, and return to OR 5/16. Chronic pain. Dysphagia  Plan: -Post-op care per CCS, appreciate wound care -discussed w Dr Janee Morn >> open PEG would be very complicated; IR eval pending to see if he can have a guided PEG. -repeat swallow eval 6/17.  Hopeful to allow to eat but likely will require PEG due to prolonged illness for now.     Mild diarrhea with blood streaks: Small quantity, cdiff pending, CBC stable.  If worsens, will need to change lovenox to SCD.  He is at very high risk for TED, so would like to leave on lovenox if possible.    Plan: -no change in  abx.  If Cdiff returns +, will have to critically consider need and selection.   Hypokalemia -noted 6/17  Plan: -monitor, replete PRN  Hypertension-on lisinopril  / norvasc at home.  Has been on Q8 IV metoprolol.  Now tolerating TF's.  Plan: -change to lopressor PO 6/17 (12.5 bid)  Michael Brim, NP-C [facility] Pgr: [phone] or [phone]  Patient awaiting PEG placement, continue weaning.as tolerated.  Swallow evaluation.  Awaiting placement.  Patient seen and examined, agree with above note.  I dictated the care and orders written for this patient under my direction.  [name], M.D. [phone]

## 2012-04-03 NOTE — Progress Notes (Signed)
Clinical Social Worker reviewed chart and staffed case with RNCM.  Per chart review, family expressed preference to [facility] if a vent SNF is needed.  CSW contacted facility who stated they had not received the referral; CSW submitted information for referral.  Per discussion with RNCM, wound vac has been dc'd.  Per previous conversations, [facility] SNF is willing to consider this pt if the wound vac is dc'd and pt is at 28%.  CSW to continue to follow and assist as needed.   Angelia Mould, MSW, [place] [phone]

## 2012-04-03 NOTE — Progress Notes (Signed)
Patient ID: ADVAY VOLANTE, male   DOB: [birth year], 66 y.o.   MRN: [id number] Patient ID: DENIM KALMBACH, male   DOB: 01-Jul-1946, 66 y.o.   MRN: [id number] Patient ID: SHONTE SODERLUND, male   DOB: Apr 12, 1946, 67 y.o.   MRN: [id number]   Subjective: On vent but answers questions well, reports abd better but sore at times, having breathing trial this am.    Objective: Vital signs in last 24 hours: Temp:  [97.6 F (36.4 C)-98.5 F (36.9 C)] 98.3 F (36.8 C) (06/17 0329) Pulse Rate:  [81-93] 81  (06/17 0329) Resp:  [17-26] 23  (06/17 0329) BP: (109-124)/(72-83) 119/73 mmHg (06/17 0329) SpO2:  [87 %-100 %] 98 % (06/17 0329) FiO2 (%):  [35 %-40 %] 40 % (06/17 0339) Last BM Date: 04/02/12  Intake/Output from previous day: 06/16 0701 - 06/17 0700 In: 1475 [I.V.:170; NG/GT:1305] Out: 375 [Urine:100; Stool:275] Intake/Output this shift:    Resp: some rhonchi B GI: soft, VAC on midline wound, minimal tenderness, + active BS  Lab Results:   Basename 04/03/12 0400 04/02/12 0012  WBC 7.9 8.6  HGB 10.4* 10.0*  HCT 32.4* 31.0*  PLT 221 207   BMET  Basename 04/03/12 0400 04/02/12 0012  NA 134* 134*  K 3.3* 3.4*  CL 100 99  CO2 28 28  GLUCOSE 105* 96  BUN 9 11  CREATININE 0.53 0.54  CALCIUM 8.5 8.3*   PT/INR No results found for this basename: LABPROT:2,INR:2 in the last 72 hours ABG No results found for this basename: PHART:2,PCO2:2,PO2:2,HCO3:2 in the last 72 hours  Studies/Results: No results found.  Anti-infectives: Anti-infectives     Start     Dose/Rate Route Frequency Ordered Stop   03/28/12 1500   levofloxacin (LEVAQUIN) IVPB 750 mg        750 mg 100 mL/hr over 90 Minutes Intravenous Every 24 hours 03/28/12 1348     03/27/12 1600   vancomycin (VANCOCIN) 1,250 mg in sodium chloride 0.9 % 250 mL IVPB  Status:  Discontinued        1,250 mg 166.7 mL/hr over 90 Minutes Intravenous Every 12 hours 03/27/12 0541 03/28/12 1348   03/25/12 0600   vancomycin (VANCOCIN)  750 mg in sodium chloride 0.9 % 150 mL IVPB  Status:  Discontinued        750 mg 150 mL/hr over 60 Minutes Intravenous Every 12 hours 03/24/12 1423 03/27/12 0541   03/24/12 1600   vancomycin (VANCOCIN) 1,500 mg in sodium chloride 0.9 % 500 mL IVPB        1,500 mg 250 mL/hr over 120 Minutes Intravenous  Once 03/24/12 1423 03/24/12 1856   03/24/12 1530   cefTAZidime (FORTAZ) 1 g in dextrose 5 % 50 mL IVPB        1 g 100 mL/hr over 30 Minutes Intravenous Every 8 hours 03/24/12 1423     03/24/12 1400   vancomycin (VANCOCIN) IVPB 1000 mg/200 mL premix  Status:  Discontinued        1,000 mg 200 mL/hr over 60 Minutes Intravenous Every 12 hours 03/24/12 1353 03/24/12 1400   03/03/12 1800   vancomycin (VANCOCIN) 1,750 mg in sodium chloride 0.9 % 500 mL IVPB  Status:  Discontinued        1,750 mg 250 mL/hr over 120 Minutes Intravenous Every 12 hours 03/03/12 0628 03/03/12 0918   02/29/12 1900   micafungin (MYCAMINE) 100 mg in sodium chloride 0.9 % 100 mL IVPB  Status:  Discontinued  100 mg 100 mL/hr over 1 Hours Intravenous Every 24 hours 02/29/12 1754 03/01/12 0933   02/29/12 1730   vancomycin (VANCOCIN) 1,250 mg in sodium chloride 0.9 % 250 mL IVPB  Status:  Discontinued        1,250 mg 166.7 mL/hr over 90 Minutes Intravenous Every 12 hours 02/29/12 1644 03/03/12 0628   02/29/12 1715   vancomycin (VANCOCIN) IVPB 1000 mg/200 mL premix  Status:  Discontinued        1,000 mg 200 mL/hr over 60 Minutes Intravenous  Once 02/29/12 1637 02/29/12 1647   02/29/12 1645   piperacillin-tazobactam (ZOSYN) IVPB 3.375 g  Status:  Discontinued        3.375 g 100 mL/hr over 30 Minutes Intravenous  Once 02/29/12 1637 02/29/12 1641   02/28/12 1800   piperacillin-tazobactam (ZOSYN) IVPB 3.375 g  Status:  Discontinued        3.375 g 12.5 mL/hr over 240 Minutes Intravenous 4 times per day 02/28/12 1454 02/28/12 1510   02/28/12 1800   piperacillin-tazobactam (ZOSYN) IVPB 3.375 g  Status:  Discontinued         3.375 g 12.5 mL/hr over 240 Minutes Intravenous Every 8 hours 02/28/12 1510 03/14/12 1700   02/25/12 1109   dextrose 5 % with cefOXitin (MEFOXIN) ADS Med     Comments: HOLLIE, CHRISTINA: cabinet override         02/25/12 1109 02/25/12 2314   02/25/12 1100   cefOXitin (MEFOXIN) 1 g in dextrose 5 % 50 mL IVPB  Status:  Discontinued        1 g 100 mL/hr over 30 Minutes Intravenous 60 min pre-op 02/25/12 1100 02/25/12 1542          Assessment/Plan: s/p Procedure(s) (LRB): DEBRIDEMENT CLOSURE/ABDOMINAL WOUND (N/A) Status post exploratory laparotomy and lysis of adhesions for SBO at any [facility].  Status post transfer to [facility] for respiratory failure requiring tracheostomy and subsequent abdominal exploration and wound debridement and closure for abdominal wound dehiscence.  Afebrile and tolerating TF, +BM - ?PEG tube, tolerating TFs through panda, Continue wound VAC, We appreciate wound care nurse doing measurements for care management.    VDRF/PNA - per CCM  Plan: Patient needing long term nutritional options given inability to swallow right now, currently has PANDA and tolerating TFs via this at 55/hour.  Family considering PEG tube although this will be difficult given his recent multiple abdominal surgeries.  IR to decide if this is feasible in Radiology.  Will follow along.  LOS: 39 days    Xana Bradt 04/03/2012

## 2012-04-03 NOTE — Consult Note (Signed)
WOC follow up  Pt seen at bedside with CCS PA's to remove retention sutures.   After removal per E. White PA-C, I have recommended dc of NPWT dressings. Wound is 100% beefy red granulation tissue and is approximating more and more each day.  Only aprox 1cm at greatest depth. PA ok with dc of NPWT and beginning of hydrogel dressings daily.  I have replaced dressing to abdominal wound today with moist gauze packing and will have staff order hydrogel to bedside to use for second dressing application later today.  After which can have daily hydogel dressings. Limit tape to abdomen due to retention suture pressure has caused some slight erosion of the skin at the entrance and exit sites as well as some irritation of the skin under the plastic bars of the stay sutures.   Bedside nursing aware of this need to replace dressing with hydrogel once it arrives to the room.  WOC will sign off at this time.  Reconsult if needed. Michael Ellison, [place] [phone]

## 2012-04-03 NOTE — Progress Notes (Signed)
Nutrition Follow-up  Radiology to assess feasibility of PEG. C-diff negative. RN reports pt continues to have frequent loose stools  Diet Order:  NPO TF: Jevity 1.2 at 55 ml/hr. Continue 30 ml Prostat via tube TID. This regimen will provide 1884 kcal, 118 grams protein, 1065 ml free water. RN reports pt is tolerating well with minimal residuals.  Free water of 200 ml q 4 hours; provides an additional 1200 ml daily.  Meds: Scheduled Meds:   . albuterol  2.5 mg Nebulization QID  . antiseptic oral rinse  15 mL Mouth Rinse QID  . budesonide  0.5 mg Nebulization BID  . cefTAZidime (FORTAZ)  IV  1 g Intravenous Q8H  . chlorhexidine  15 mL Mouth Rinse BID  . enoxaparin  40 mg Subcutaneous Q24H  . escitalopram  10 mg Oral Daily  . feeding supplement  30 mL Per Tube TID WC  . fentaNYL  100 mcg Intravenous Once  . free water  200 mL Per Tube Q4H  . levofloxacin (LEVAQUIN) IV  750 mg Intravenous Q24H  . metoprolol  2.5 mg Intravenous Q8H  . midazolam  2 mg Intravenous Once  . pantoprazole sodium  40 mg Per Tube QHS  . potassium chloride  40 mEq Per Tube Once  . potassium chloride  40 mEq Per Tube Once  . sodium chloride  3 mL Intravenous Q12H   Continuous Infusions:   . sodium chloride 1,000 mL (04/02/12 1323)  . feeding supplement (JEVITY 1.2 CAL) 1,000 mL (04/02/12 1621)  . DISCONTD: dextrose 5 % and 0.45% NaCl 10 mL/hr at 04/01/12 0453   PRN Meds:.albuterol, ALPRAZolam, bisacodyl, fentaNYL, hydrALAZINE, phenol  Labs:  CMP     Component Value Date/Time   NA 134* 04/03/2012 0400   K 3.3* 04/03/2012 0400   CL 100 04/03/2012 0400   CO2 28 04/03/2012 0400   GLUCOSE 105* 04/03/2012 0400   BUN 9 04/03/2012 0400   CREATININE 0.53 04/03/2012 0400   CALCIUM 8.5 04/03/2012 0400   PROT 6.1 03/29/2012 0420   ALBUMIN 1.9* 03/29/2012 0420   AST 13 03/29/2012 0420   ALT 10 03/29/2012 0420   ALKPHOS 49 03/29/2012 0420   BILITOT 0.3 03/29/2012 0420   GFRNONAA >90 04/03/2012 0400   GFRAA >90 04/03/2012  0400     Intake/Output Summary (Last 24 hours) at 04/03/12 1021 Last data filed at 04/03/12 0600  Gross per 24 hour  Intake   1230 ml  Output    100 ml  Net   1130 ml    Weight Status:  75.2 kg - stable  Body mass index is 21.29 kg/(m^2). WNL  Re-estimated needs:  1850-1950 kcals, 105-120 grams protein daily  Nutrition Dx:  Inadequate oral intake - ongoing.  Goal:  Intake to meet 90-100% of estimated nutrition needs, met  Intervention:  Recommend possible GI consult to work-up frequent bloody diarrhea. RD to continue to follow.  Monitor:  Weights, labs, PO intake, I/O's, vent use  Adair Laundry Pager #:  [phone]

## 2012-04-03 NOTE — Progress Notes (Signed)
Passy-Muir Speaking Valve - Treatment Patient Details  Name: Michael Ellison MRN: [id number] Date of Birth: [birth year]  Today's Date: 04/03/2012 Time: 4098-1191 SLP Time Calculation (min): 15 min  Past Medical History:  Past Medical History  Diagnosis Date  . Hypertension   . Cancer   . Cancer of prostate    Past Surgical History:  Past Surgical History  Procedure Date  . Hernia repair   . Prostate biopsy   . Robotic prostate surgery 2011  . Laparotomy 02/25/2012    Procedure: EXPLORATORY LAPAROTOMY;  Surgeon: [name], MD;  Location: [facility]S;  Service: General;  Laterality: N/A;  . Bowel resection 02/25/2012    Procedure: SMALL BOWEL RESECTION;  Surgeon: [name], MD;  Location: [facility] ORS;  Service: General;;  . Wound debridement 03/02/2012    Procedure: DEBRIDEMENT CLOSURE/ABDOMINAL WOUND;  Surgeon: Cherylynn Ridges, MD;  Location: [facility] OR;  Service: General;  Laterality: N/A;    Assessment / Plan / Recommendation Clinical Impression  Pt on trach collar after several days on the vent. Placed PMSV without difficulty, pt called mother and conversed for 10 minutes with adequate breath support, no CO2 trapping or distress. Pt tolerating valve, may continue use with intermittent supervision during all waking hours, as before. Will /fu later today for swallow treatment as time allows to determine if pt shows any signs of improvement with swallow function given possible PEG placement.     Plan       Follow Up Recommendations       Pertinent Vitals/Pain NA    SLP Goals SLP Goal #1: Pt will tolerate PMSV during all waking hours with intermittent supervision without distress.  SLP Goal #1 - Progress: Progressing toward goal SLP Goal #2: Pt will demonstrate independence with valve placement and removal with min verbal and visual cues.  SLP Goal #2 - Progress: Not met   PMSV Trial      Tracheostomy Tube       Vent Dependency  FiO2 (%): 35 %    Cuff Deflation Trial      Britani Beattie, [name] 04/03/2012, 10:28 AM

## 2012-04-03 NOTE — Progress Notes (Signed)
Agree with note by LS,PA. May be able to loosen the retentions.

## 2012-04-03 NOTE — Progress Notes (Signed)
Physical Therapy Treatment Patient Details Name: Michael Ellison MRN: [id number] DOB: 11-01-1945 Today's Date: 04/03/2012 Time: 4098-1191 PT Time Calculation (min): 26 min  PT Assessment / Plan / Recommendation Comments on Treatment Session  Pt s/p SBO, VDRF, trach now without VAC and off the vent! Pt able to progress ambulation today but limited by fear of falling although no obvious LOB or knee buckling throughout. Pt encouraged to increase distance next session. Pt pleasant and happy to progress ambulation.     Follow Up Recommendations       Barriers to Discharge        Equipment Recommendations       Recommendations for Other Services    Frequency     Plan Discharge plan remains appropriate;Frequency remains appropriate    Precautions / Restrictions Precautions Precautions: Fall Precaution Comments: trach collar   Pertinent Vitals/Pain No pain O2 reading 85-95% throughout on 40% trach collar but not good wave form other than in 90s    Mobility  Bed Mobility Bed Mobility: Supine to Sit Supine to Sit: 6: Modified independent (Device/Increase time);With rails;HOB elevated (HOB 20degrees) Sitting - Scoot to Edge of Bed: 6: Modified independent (Device/Increase time) Transfers Transfers: Sit to Stand;Stand to Sit Sit to Stand: 4: Min guard;From bed Stand to Sit: 5: Supervision;To chair/3-in-1;With armrests Details for Transfer Assistance: cueing for hand placement and safety Ambulation/Gait Ambulation/Gait Assistance: 4: Min guard Ambulation Distance (Feet): 215 Feet Assistive device: Rolling walker Ambulation/Gait Assistance Details: Pt cued to step into RW Gait Pattern: Step-through pattern;Decreased stride length Gait velocity: decreased Stairs: No    Exercises General Exercises - Lower Extremity Long Arc Quad: AROM;Both;20 reps;Seated Hip Flexion/Marching: AROM;Both;10 reps;Seated   PT Diagnosis:    PT Problem List:   PT Treatment Interventions:     PT  Goals Acute Rehab PT Goals PT Goal: Supine/Side to Sit - Progress: Progressing toward goal PT Goal: Sit at Edge Of Bed - Progress: Progressing toward goal PT Goal: Sit to Stand - Progress: Progressing toward goal PT Goal: Stand to Sit - Progress: Progressing toward goal PT Transfer Goal: Bed to Chair/Chair to Bed - Progress: Progressing toward goal Pt will Ambulate: >150 feet;with modified independence PT Goal: Ambulate - Progress: Updated due to goal met  Visit Information  Last PT Received On: 04/03/12 Assistance Needed: +1    Subjective Data  Subjective: I just get scared that I'm gonna fall   Cognition  Overall Cognitive Status: Appears within functional limits for tasks assessed/performed Arousal/Alertness: Awake/alert Orientation Level: Appears intact for tasks assessed Behavior During Session: [facility] for tasks performed    Balance     End of Session PT - End of Session Activity Tolerance: Patient tolerated treatment well Patient left: in chair;with call bell/phone within reach;with nursing in room Nurse Communication: Mobility status    Delorse Lek 04/03/2012, 2:58 PM Delaney Meigs, PT [phone]

## 2012-04-03 NOTE — H&P (Signed)
Michael Ellison is an 66 y.o. male.   Chief Complaint: Dysphagia; dementia; malnutrition SBO due to adhesions; SB resection 02/25/12 Scheduled for percutaneous gastric tube placement HPI: HTN; prostate Ca; SBO/resection  Past Medical History  Diagnosis Date  . Hypertension   . Cancer   . Cancer of prostate     Past Surgical History  Procedure Date  . Hernia repair   . Prostate biopsy   . Robotic prostate surgery 2011  . Laparotomy 02/25/2012    Procedure: EXPLORATORY LAPAROTOMY;  Surgeon: [name], MD;  Location: [facility];  Service: General;  Laterality: N/A;  . Bowel resection 02/25/2012    Procedure: SMALL BOWEL RESECTION;  Surgeon: [name], MD;  Location: [facility];  Service: General;;  . Wound debridement 03/02/2012    Procedure: DEBRIDEMENT CLOSURE/ABDOMINAL WOUND;  Surgeon: Cherylynn Ridges, MD;  Location: [facility] OR;  Service: General;  Laterality: N/A;    Family History  Problem Relation Age of Onset  . Pseudochol deficiency Neg Hx   . Anesthesia problems Neg Hx   . Hypotension Neg Hx   . Malignant hyperthermia Neg Hx    Social History:  reports that he has been smoking.  He does not have any smokeless tobacco history on file. He reports that he drinks alcohol. He reports that he does not use illicit drugs.  Allergies:  Allergies  Allergen Reactions  . Lorazepam Other (See Comments)    Severe agitated delirium. Tolerates midazolam and other benzo's    Medications Prior to Admission  Medication Sig Dispense Refill  . amLODipine (NORVASC) 5 MG tablet Take 5 mg by mouth daily.      Marland Kitchen imipramine (TOFRANIL) 25 MG tablet Take 25 mg by mouth at bedtime.      Marland Kitchen lisinopril (PRINIVIL,ZESTRIL) 20 MG tablet Take 20 mg by mouth daily.        Results for orders placed during the hospital encounter of 02/24/12 (from the past 48 hour(s))  BASIC METABOLIC PANEL     Status: Abnormal   Collection Time   04/02/12 12:12 AM      Component Value Range Comment   Sodium 134 (*) 135  - 145 mEq/L    Potassium 3.4 (*) 3.5 - 5.1 mEq/L    Chloride 99  96 - 112 mEq/L    CO2 28  19 - 32 mEq/L    Glucose, Bld 96  70 - 99 mg/dL    BUN 11  6 - 23 mg/dL    Creatinine, Ser 1.61  0.50 - 1.35 mg/dL    Calcium 8.3 (*) 8.4 - 10.5 mg/dL    GFR calc non Af Amer >90  >90 mL/min    GFR calc Af Amer >90  >90 mL/min   CBC     Status: Abnormal   Collection Time   04/02/12 12:12 AM      Component Value Range Comment   WBC 8.6  4.0 - 10.5 K/uL    RBC 3.65 (*) 4.22 - 5.81 MIL/uL    Hemoglobin 10.0 (*) 13.0 - 17.0 g/dL    HCT 09.6 (*) 04.5 - 52.0 %    MCV 84.9  78.0 - 100.0 fL    MCH 27.4  26.0 - 34.0 pg    MCHC 32.3  30.0 - 36.0 g/dL    RDW 40.9  81.1 - 91.4 %    Platelets 207  150 - 400 K/uL   BASIC METABOLIC PANEL     Status: Abnormal  Collection Time   04/03/12  4:00 AM      Component Value Range Comment   Sodium 134 (*) 135 - 145 mEq/L    Potassium 3.3 (*) 3.5 - 5.1 mEq/L    Chloride 100  96 - 112 mEq/L    CO2 28  19 - 32 mEq/L    Glucose, Bld 105 (*) 70 - 99 mg/dL    BUN 9  6 - 23 mg/dL    Creatinine, Ser 1.61  0.50 - 1.35 mg/dL    Calcium 8.5  8.4 - 09.6 mg/dL    GFR calc non Af Amer >90  >90 mL/min    GFR calc Af Amer >90  >90 mL/min   CBC     Status: Abnormal   Collection Time   04/03/12  4:00 AM      Component Value Range Comment   WBC 7.9  4.0 - 10.5 K/uL    RBC 3.84 (*) 4.22 - 5.81 MIL/uL    Hemoglobin 10.4 (*) 13.0 - 17.0 g/dL    HCT 04.5 (*) 40.9 - 52.0 %    MCV 84.4  78.0 - 100.0 fL    MCH 27.1  26.0 - 34.0 pg    MCHC 32.1  30.0 - 36.0 g/dL    RDW 81.1  91.4 - 78.2 %    Platelets 221  150 - 400 K/uL    No results found.  Review of Systems  Constitutional: Negative for fever.  Respiratory: Negative for cough.   Cardiovascular: Negative for chest pain.  Gastrointestinal: Negative for nausea and vomiting.  Neurological: Negative for headaches.       Delerium    Blood pressure 123/68, pulse 93, temperature 97.8 F (36.6 C), temperature source Oral,  resp. rate 27, height 6\' 2"  (1.88 m), weight 165 lb 12.6 oz (75.2 kg), SpO2 98.00%. Physical Exam  Cardiovascular: Normal rate, regular rhythm and normal heart sounds.   Respiratory: Effort normal. He has wheezes.  GI: Soft. Bowel sounds are normal. There is no tenderness.  Musculoskeletal: Normal range of motion.       Moves all 4s; weakness  Neurological:       dementia  Skin: Skin is warm and dry.  Psychiatric:       dtr consented over phone     Assessment/Plan Dysphagia; dementia; malnutrition SBO after resection/adhesions Scheduled for percutaneous gastric tube placement in IR 6/18 Will hold Lovenox 6/18; continue Fortaz Check KUB in am Consent via phone with dtr  [facility] A 04/03/2012, 1:56 PM

## 2012-04-04 ENCOUNTER — Inpatient Hospital Stay (HOSPITAL_COMMUNITY): Payer: Medicare Other

## 2012-04-04 LAB — BASIC METABOLIC PANEL
BUN: 9 mg/dL (ref 6–23)
Calcium: 8.7 mg/dL (ref 8.4–10.5)
Creatinine, Ser: 0.56 mg/dL (ref 0.50–1.35)
GFR calc Af Amer: >90 mL/min (ref >90)
GFR calc non Af Amer: >90 mL/min (ref >90)
Potassium: 3.7 mEq/L (ref 3.5–5.1)

## 2012-04-04 LAB — CBC
Hemoglobin: 11 g/dL — ABNORMAL LOW (ref 13.0–17.0)
MCHC: 32.4 g/dL (ref 30.0–36.0)
Platelets: 224 10*3/uL (ref 150–400)
RDW: 14.5 % (ref 11.5–15.5)

## 2012-04-04 LAB — MAGNESIUM: Magnesium: 1.8 mg/dL (ref 1.5–2.5)

## 2012-04-04 MED ORDER — MIDAZOLAM HCL 5 MG/5ML IJ SOLN
INTRAMUSCULAR | Status: AC | PRN
  Administered 2012-04-04 (×4): 1 mg via INTRAVENOUS

## 2012-04-04 MED ORDER — IOHEXOL 300 MG/ML  SOLN
50.0000 mL | Freq: Once | INTRAMUSCULAR | Status: AC | PRN
  Administered 2012-04-04: 20 mL

## 2012-04-04 MED ORDER — FENTANYL CITRATE 0.05 MG/ML IJ SOLN
INTRAMUSCULAR | Status: AC
  Filled 2012-04-04: qty 6

## 2012-04-04 MED ORDER — FENTANYL CITRATE 0.05 MG/ML IJ SOLN
INTRAMUSCULAR | Status: AC | PRN
  Administered 2012-04-04: 50 ug via INTRAVENOUS
  Administered 2012-04-04 (×2): 25 ug via INTRAVENOUS
  Administered 2012-04-04 (×2): 50 ug via INTRAVENOUS

## 2012-04-04 MED ORDER — MIDAZOLAM HCL 2 MG/2ML IJ SOLN
INTRAMUSCULAR | Status: AC
  Filled 2012-04-04: qty 6

## 2012-04-04 NOTE — Progress Notes (Signed)
Name: Michael Ellison MRN: [id number] DOB: 1946/07/11    LOS: 40  PULMONARY / CRITICAL CARE MEDICINE  Pt Profile:   66 y/o male smoker admitted to [facility] 02/24/2012 with n/v and abdominal pain due to SBO due to adhesions.  Had ex lap with SB resection 5/10.  Developed delirium post-op.  Transferred to [facility] 5/14 for respiratory failure requiring intubation.  Developed abdominal fascial dehiscence and taken back to OR 5/16.  Failed extubation, and required tracheostomy 5/22. PMHx ETOH, HTN, Prostate cancer  Interval hx:  5/16: OR for fascial dehiscence repair- closed with wound vac 5/18- failed weaning 5/19- extubated 5/21- re-intubated for hypercarbia 5/22-improved co2 and neurostatus, pos almost 2 liters 5/22 SVT>>resolved spontaneously 5/30 -off vent for 24 hours, planned Panda placement 6/1 - up in chair, improved. 6/3 - desat, increased WOB, vent 6/3 - leak trach, improved with extra long 6/5 - FEES eval: failed, still showing evidence of aspiration.  6/16 - Few liquid stools with streaks of blood,  H/h stable.  (has been having mucoid / bloody streaked stool -surgeons aware)  Subjective: RT reports patient required vent support last night due to increased secretions.  Now back on ATC.  Patient with no acute c/o's   Vital Signs: Temp:  [97.7 F (36.5 C)-98.3 F (36.8 C)] 98.3 F (36.8 C) (06/18 1129) Pulse Rate:  [82-108] 100  (06/18 1129) Resp:  [16-30] 29  (06/18 1129) BP: (102-152)/(21-96) 126/87 mmHg (06/18 1129) SpO2:  [88 %-99 %] 92 % (06/18 1129) FiO2 (%):  [35 %-40 %] 40 % (06/18 1129) Weight:  [70.3 kg (154 lb 15.7 oz)] 70.3 kg (154 lb 15.7 oz) (06/18 0435)  Physical Examination: General - Alert and interactive. HEENT - trach site clean, #6.0 xlt Cardiac - regular, no m/r/g Chest -a few scattered crackles, no wheezing, good airflow. Abd - round/soft, bs+ Ext - no edema Neuro - awake and interacting, moves all ext  CBC  Lab 04/04/12 0428 04/03/12 0400 04/02/12  0012  HGB 11.0* 10.4* 10.0*  HCT 34.0* 32.4* 31.0*  WBC 9.7 7.9 8.6  PLT 224 221 207   BMET  Lab 04/04/12 0428 04/03/12 0400 04/02/12 0012 03/31/12 0347 03/30/12 0400  NA 139 134* 134* 138 137  K 3.7 3.3* -- -- --  CL 104 100 99 100 100  CO2 25 28 28 30 30   GLUCOSE 89 105* 96 94 107*  BUN 9 9 11 13 12   CREATININE 0.56 0.53 0.54 0.65 0.55  CALCIUM 8.7 8.5 8.3* 8.7 8.4  MG 1.8 -- -- -- 1.7  PHOS -- -- -- -- 2.7   Lab Results  Component Value Date   ALT 10 03/29/2012   AST 13 03/29/2012   ALKPHOS 49 03/29/2012   BILITOT 0.3 03/29/2012    Intake/Output Summary (Last 24 hours) at 04/04/12 1134 Last data filed at 04/04/12 1102  Gross per 24 hour  Intake 1743.5 ml  Output      0 ml  Net 1743.5 ml   Medications reviewed   ASSESSMENT AND PLAN  Acute hypoxic/hypercapnic respiratory failure 2nd to delirium, aspiration pneumonia, 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  Failed extubation, and s/p trach 5/22. Xtra long 6 cuffed 6/3>>>   Has not been able to come off vent support. Think deconditioning and Pneumonia contributing to this. He has both Klebsiella and Stenotrophomonas. Making some progress after apparent mucous plugging.   6/17 ATC with no acute distress.   Plan: -Cont scheduled BDs, will continue  ICS -Continue aggressive ATC as tolerated with PRN and nocturnal vent -Cont vibravest.  SBO s/p lap with SB resection 5/10 complicated by post-op ileus and fascial dehiscence, and return to OR 5/16. Chronic pain. Dysphagia  Plan: - Post-op care per CCS, appreciate wound care - Discussed w Dr Janee Morn >> open PEG would be very complicated; IR placed PEG, appreciate input. - Repeat swallow eval pending.  Hopeful to allow to eat but likely will require PEG due to prolonged illness for now.     Mild diarrhea with blood streaks: Small quantity, cdiff negative, CBC stable.  If worsens, will need to change lovenox to SCD.  He is at very high risk for TED, so  would like to leave on lovenox if possible.    Plan: - No change in abx, continue levofloxacin for stenotrophomonas.    Hypokalemia -noted 6/17  Plan: -monitor, replete PRN  Hypertension-on lisinopril  / norvasc at home.  Has been on Q8 IV metoprolol.  Now tolerating TF's.  Plan: -change to lopressor PO 6/17 (12.5 bid)  Now that PEG is in place, will begin looking for vent SNF.  Alyson Reedy, M.D. [facility] Pulmonary/Critical Care Medicine. Pager: [phone]. After hours pager: [phone].

## 2012-04-04 NOTE — Procedures (Signed)
Placement of percutaneous gastrostomy tube.  20 French tube placed in stomach and confirmed with contrast injection.

## 2012-04-04 NOTE — Progress Notes (Signed)
PT/OT Cancellation Note  Treatment cancelled today due to patient receiving procedure or test at Xray.  Quinnetta Roepke, OTR/L Pager [phone] 04/04/2012, 11:42 AM

## 2012-04-04 NOTE — Progress Notes (Signed)
Clinical Social Worker communicated with Saralyn Pilar and submitted requested additional paperwork (Wound Care notes).  CSW communicated with [facility] who stated they do not have any beds available at this time.  CSW left messages with [facility] at [facility] and [facility] to inquire about wait list.   CSW to continue to follow and assist as needed.   Angelia Mould, MSW, [place] [phone]

## 2012-04-04 NOTE — Progress Notes (Signed)
Per day shift report, wound vac was removed today from site of dehiscence on patient's abdomen.  The patient's bowel sounds are present x4 and patient reports some tenderness on palpation, but not otherwise.   Dressing is dated for today and is clean, dry, and intact.  Timmie Foerster, RN

## 2012-04-04 NOTE — Progress Notes (Signed)
Physical Therapy Treatment Patient Details Name: Michael Ellison MRN: [id number] DOB: Feb 24, 1946 Today's Date: 04/04/2012 Time: 1437-1500 PT Time Calculation (min): 23 min  PT Assessment / Plan / Recommendation Comments on Treatment Session  Patient s/p SB resection, VDRF,  and trach.  Had PEG placed today and treatment limited by pain today and only agreed to OOB.  Continue PT as able.      Follow Up Recommendations  Skilled nursing facility;Supervision/Assistance - 24 hour    Barriers to Discharge  None      Equipment Recommendations  Defer to next venue    Recommendations for Other Services  None  Frequency Min 3X/week   Plan Discharge plan remains appropriate;Frequency remains appropriate    Precautions / Restrictions Precautions Precautions: Fall Precaution Comments: trach and PEG placed  Restrictions Weight Bearing Restrictions: No   Pertinent Vitals/Pain Sats dropped to87 % at end of treatment however came up to 92 % prior to departure, nursing notified, 10/10 pain abdomen    Mobility  Bed Mobility Bed Mobility: Rolling Left;Left Sidelying to Sit Rolling Right: Not tested (comment) Rolling Left: 4: Min assist Right Sidelying to Sit: Not tested (comment) Left Sidelying to Sit: 4: Min assist;With rails Supine to Sit: Not tested (comment) Sitting - Scoot to Edge of Bed: 4: Min assist Sit to Supine: Not Tested (comment) Details for Bed Mobility Assistance: Patient needed asssit today secondary PEG placed and patient sore.  Patient had to be cleaned of stool (nurssing notified that stool was mucousy and bloody.   Transfers Transfers: Sit to Stand;Stand to [employer] Transfers Sit to Stand: 4: Min guard;With upper extremity assist;From bed Stand to Sit: 4: Min guard;With upper extremity assist;To chair/3-in-1 Stand Pivot Transfers: 4: Min guard Details for Transfer Assistance: cues for hand placement and safety as well as assist for  lines Ambulation/Gait Ambulation/Gait Assistance: Not tested (comment) Stairs: No Wheelchair Mobility Wheelchair Mobility: No    PT Goals Acute Rehab PT Goals PT Goal: Supine/Side to Sit - Progress: Progressing toward goal PT Goal: Sit at Edge Of Bed - Progress: Progressing toward goal PT Goal: Sit to Stand - Progress: Progressing toward goal PT Goal: Stand to Sit - Progress: Progressing toward goal PT Transfer Goal: Bed to Chair/Chair to Bed - Progress: Progressing toward goal  Visit Information  Last PT Received On: 04/04/12 Assistance Needed: +2    Subjective Data  Subjective: "I feel bad from my procedure."   Cognition  Overall Cognitive Status: Appears within functional limits for tasks assessed/performed Difficult to assess due to: Tracheostomy Arousal/Alertness: Awake/alert Orientation Level: Appears intact for tasks assessed Behavior During Session: [facility] for tasks performed    Balance     End of Session PT - End of Session Equipment Utilized During Treatment: Gait belt Activity Tolerance: Patient limited by pain Patient left: in chair;with call bell/phone within reach Nurse Communication: Mobility status    INGOLD,Marty Sadlowski 04/04/2012, 4:14 PM [employer] Acute Rehabilitation [phone] (pager)

## 2012-04-05 ENCOUNTER — Inpatient Hospital Stay (HOSPITAL_COMMUNITY): Payer: Medicare Other

## 2012-04-05 LAB — CBC
MCHC: 31.9 g/dL (ref 30.0–36.0)
Platelets: 239 10*3/uL (ref 150–400)
RDW: 14.5 % (ref 11.5–15.5)
WBC: 11 10*3/uL — ABNORMAL HIGH (ref 4.0–10.5)

## 2012-04-05 LAB — BASIC METABOLIC PANEL
Chloride: 103 mEq/L (ref 96–112)
GFR calc Af Amer: >90 mL/min (ref >90)
GFR calc non Af Amer: >90 mL/min (ref >90)
Potassium: 3.4 mEq/L — ABNORMAL LOW (ref 3.5–5.1)
Sodium: 138 mEq/L (ref 135–145)

## 2012-04-05 LAB — MAGNESIUM: Magnesium: 1.6 mg/dL (ref 1.5–2.5)

## 2012-04-05 MED ORDER — POTASSIUM PHOSPHATE DIBASIC 3 MMOLE/ML IV SOLN
30.0000 mmol | Freq: Once | INTRAVENOUS | Status: AC
  Administered 2012-04-05: 30 mmol via INTRAVENOUS
  Filled 2012-04-05: qty 10

## 2012-04-05 MED ORDER — MAGNESIUM SULFATE 40 MG/ML IJ SOLN
2.0000 g | Freq: Once | INTRAMUSCULAR | Status: AC
  Administered 2012-04-05: 2 g via INTRAVENOUS
  Filled 2012-04-05: qty 50

## 2012-04-05 MED ORDER — POTASSIUM CHLORIDE 20 MEQ/15ML (10%) PO LIQD
40.0000 meq | Freq: Once | ORAL | Status: DC
  Filled 2012-04-05: qty 30

## 2012-04-05 NOTE — Progress Notes (Signed)
[facility]y-Mu[facility] Speaking Valve - Treatment Patient Details  Name: Michael Ellison MRN: [id number] Date of Birth: 08-30-1946  Today's Date: 04/05/2012 Time: 1308-6578 SLP Time Calculation (min): 17 min  Past Medical History:  Past Medical History  Diagnosis Date  . Hypertension   . Cancer   . Cancer of prostate    Past Surgical History:  Past Surgical History  Procedure Date  . Hernia repair   . Prostate biopsy   . Robotic prostate surgery 2011  . Laparotomy 02/25/2012    Procedure: EXPLORATORY LAPAROTOMY;  Surgeon: [name], MD;  Location: [facility];  Service: General;  Laterality: N/A;  . Bowel resection 02/25/2012    Procedure: SMALL BOWEL RESECTION;  Surgeon: [name], MD;  Location: [facility];  Service: General;;  . Wound debridement 03/02/2012    Procedure: DEBRIDEMENT CLOSURE/ABDOMINAL WOUND;  Surgeon: Cherylynn Ridges, MD;  Location: [facility] OR;  Service: General;  Laterality: N/A;    Assessment / Plan / Recommendation Clinical Impression  Pt with PMSV in place, SLP arrived to attempt PO trials s/p PEG placement. Pt with O2 sats consistently in the low 80s, RR at 33. SLP removed PMSV with no change in oxygenation. Replaced for a trial, again with no change despite verbal cues for deep breathing. Pt reports fatigue, feels short of breath. RN called respiratory to consider putting pt back on vent. PO dtrials deferred despite pts desire to eat and drink. Progress with dysphagia interrupted by pts easy fatigue after activity (yestreday after PT, today requiring vent). Pt is able to tolerate valve when tolerating trach collar sufficiently. Will continue to follow.     Plan       Follow Up Recommendations       Pertinent Vitals/Pain See above    SLP Goals Potential to Achieve Goals: Fair Potential Considerations: Co-morbidities Progress/Goals/Alternative treatment plan discussed with pt/caregiver and they: Agree SLP Goal #1: Pt will tolerate PMSV during all waking hours with  intermittent supervision without distress.  SLP Goal #1 - Progress: Progressing toward goal   PMSV Trial      Tracheostomy Tube       Vent Dependency  FiO2 (%): 40 %    Cuff Deflation Trial     Braylon Lemmons, [name] 04/05/2012, 9:16 AM

## 2012-04-05 NOTE — Progress Notes (Signed)
34 Days Post-Op  Subjective: Feels OK, no abd c/o today  Objective: Vital signs in last 24 hours: Temp:  [97.6 F (36.4 C)-98.7 F (37.1 C)] 97.8 F (36.6 C) (06/19 0816) Pulse Rate:  [92-108] 105  (06/19 0816) Resp:  [16-36] 28  (06/19 0816) BP: (111-152)/(21-96) 115/80 mmHg (06/19 0816) SpO2:  [87 %-98 %] 92 % (06/19 0816) FiO2 (%):  [40 %] 40 % (06/19 0534) Weight:  [157 lb 10.1 oz (71.5 kg)] 157 lb 10.1 oz (71.5 kg) (06/19 0003)   Intake/Output from previous day: 06/18 0701 - 06/19 0700 In: 960 [I.V.:110; NG/GT:600; IV Piggyback:250] Out: -  Intake/Output this shift:     General appearance: alert and no distress Abdomen soft. New gastrostomy tube in place   Lab Results:   Basename 04/05/12 0417 04/04/12 0428  WBC 11.0* 9.7  HGB 11.7* 11.0*  HCT 36.7* 34.0*  PLT 239 224   BMET  Basename 04/05/12 0417 04/04/12 0428  NA 138 139  K 3.4* 3.7  CL 103 104  CO2 28 25  GLUCOSE 72 89  BUN 13 9  CREATININE 0.77 0.56  CALCIUM 8.8 8.7   PT/INR  Basename 04/03/12 1421  LABPROT 14.9  INR 1.15   ABG No results found for this basename: PHART:2,PCO2:2,PO2:2,HCO3:2 in the last 72 hours  MEDS, Scheduled    . albuterol  2.5 mg Nebulization QID  . antiseptic oral rinse  15 mL Mouth Rinse QID  . budesonide  0.5 mg Nebulization BID  . cefTAZidime (FORTAZ)  IV  1 g Intravenous Q8H  . chlorhexidine  15 mL Mouth Rinse BID  . enoxaparin  40 mg Subcutaneous Q24H  . escitalopram  10 mg Oral Daily  . feeding supplement  30 mL Per Tube TID WC  . fentaNYL      . fentaNYL  100 mcg Intravenous Once  . free water  200 mL Per Tube Q4H  . levofloxacin (LEVAQUIN) IV  750 mg Intravenous Q24H  . metoprolol tartrate  12.5 mg Per Tube BID  . midazolam      . midazolam  2 mg Intravenous Once  . pantoprazole sodium  40 mg Per Tube QHS  . potassium chloride  40 mEq Per Tube Once  . sodium chloride  3 mL Intravenous Q12H    Studies/Results: Ir Gastrostomy Tube Mod  Sed  04/04/2012  *RADIOLOGY REPORT*  Clinical history:History of abdominal surgery, abdominal open wound and respiratory difficulties.  The patient needs a gastrostomy tube for nutrition.  PROCEDURE(S): PERCUTANEOUS GASTROSTOMY TUBE WITH FLUOROSCOPIC GUIDANCE  Physician: Rachelle Hora. Lowella Dandy, MD  Medications:Versed 4.00mg , Fentanyl 200 mcg  Moderate sedation time:45 minutes  Fluoroscopy time: 13.1 minutes  Procedure:Informed consent was obtained for a percutaneous gastrostomy tube.  The patient was placed on the interventional table.  Fluoroscopy demonstrated oral contrast in the transverse colon.  An orogastric tube was placed with fluoroscopic guidance. The anterior abdomen was prepped and draped in sterile fashion. Maximal barrier sterile technique was utilized including caps, mask, sterile gowns, sterile gloves, sterile drape, hand hygiene and skin antiseptic.  Stomach was inflated with air through the orogastric tube.  The skin and subcutaneous tissues were anesthetized with 1% lidocaine.  A 17 gauge needle was directed into the distended stomach with fluoroscopic guidance.  A wire was advanced towards the stomach and a T-tact was deployed.  The wire did not appear to be in the correct position.  A 5-French catheter was placed and a small amount contrast was injected.  The catheter was located within the peritoneal space.  The catheter was completely removed.  The needle was directed back into the stomach with fluoroscopy and a stiff Amplatz wire was placed.  5-French catheter was advanced over the wire and contrast injection confirmed placement in the stomach.  A 9-French vascular sheath was placed and the orogastric tube was snared using a Gooseneck snare device.  The orogastric tube and snare were pulled out of the patient's mouth.  The snare device was connected to a 20-French gastrostomy tube.  The snare device and gastrostomy tube were pulled through the patient's mouth and out the anterior abdominal wall.  The  gastrostomy tube was cut to an appropriate length. Contrast injection through gastrostomy tube confirmed placement within the stomach.  Fluoroscopic images were obtained for documentation.  The gastrostomy tube was flushed with normal saline.  Findings:Gastrostomy tube within the stomach. Small amount of contrast within the peritoneal cavity from the initial injection.  Complications: None  Impression:Successful fluoroscopic guided percutaneous gastrostomy tube placement.  Original Report Authenticated By: Richarda Overlie, M.D.   Dg Abd Portable 1v  04/04/2012  *RADIOLOGY REPORT*  Clinical Data: Evaluate bowel opacification prior to G-tube placement.  PORTABLE ABDOMEN - 1 VIEW  Comparison: 03/23/2012  Findings: There is a feeding tube with the tip near the ligament of Treitz.  There is contrast within the transverse colon.  The splenic flexure and hepatic flexure are also opacified. No evidence for small bowel distention.  IMPRESSION: Opacification of the transverse colon.  Original Report Authenticated By: Richarda Overlie, M.D.    Assessment: s/p Procedure(s): DEBRIDEMENT CLOSURE/ABDOMINAL WOUND Stable surgically  Plan: Will follow. Wound  can be managed as OP   LOS: 41 days     Currie Paris, MD, [facility] Surgery, [place] [phone]   04/05/2012 8:25 AM

## 2012-04-05 NOTE — Progress Notes (Signed)
Nutrition Follow-up  PEG placed 6/18. TF held during this time. Per radiology note from today, PEG now ok to use. Continues to work with SLP for PMSV; SLP deferring PO trials per today's note.  Continues to look for vent SNF.  TF has yet to be restarted. RN unable to state when it will be restarted.  Diet Order:  NPO TF: Jevity 1.2 at at 55 ml/hr. Continue 30 ml Prostat via tube TID. This regimen will provide 1884 kcal, 118 grams protein, 1065 ml free water.  Free water of 200 ml q 4 hours; provides an additional 1200 ml daily.  Meds: Scheduled Meds:   . albuterol  2.5 mg Nebulization QID  . antiseptic oral rinse  15 mL Mouth Rinse QID  . budesonide  0.5 mg Nebulization BID  . cefTAZidime (FORTAZ)  IV  1 g Intravenous Q8H  . chlorhexidine  15 mL Mouth Rinse BID  . enoxaparin  40 mg Subcutaneous Q24H  . escitalopram  10 mg Oral Daily  . feeding supplement  30 mL Per Tube TID WC  . fentaNYL      . fentaNYL  100 mcg Intravenous Once  . free water  200 mL Per Tube Q4H  . levofloxacin (LEVAQUIN) IV  750 mg Intravenous Q24H  . metoprolol tartrate  12.5 mg Per Tube BID  . midazolam      . midazolam  2 mg Intravenous Once  . pantoprazole sodium  40 mg Per Tube QHS  . potassium chloride  40 mEq Per Tube Once  . sodium chloride  3 mL Intravenous Q12H   Continuous Infusions:   . sodium chloride 10 mL (04/04/12 0603)  . feeding supplement (JEVITY 1.2 CAL) 1,000 mL (04/03/12 1600)   PRN Meds:.albuterol, ALPRAZolam, bisacodyl, fentaNYL, fentaNYL, hydrALAZINE, iohexol, midazolam, phenol  Labs:   Sodium now WNL, potassium remains low. CMP     Component Value Date/Time   NA 138 04/05/2012 0417   K 3.4* 04/05/2012 0417   CL 103 04/05/2012 0417   CO2 28 04/05/2012 0417   GLUCOSE 72 04/05/2012 0417   BUN 13 04/05/2012 0417   CREATININE 0.77 04/05/2012 0417   CALCIUM 8.8 04/05/2012 0417   PROT 6.1 03/29/2012 0420   ALBUMIN 1.9* 03/29/2012 0420   AST 13 03/29/2012 0420   ALT 10 03/29/2012 0420    ALKPHOS 49 03/29/2012 0420   BILITOT 0.3 03/29/2012 0420   GFRNONAA >90 04/05/2012 0417   GFRAA >90 04/05/2012 0417     Intake/Output Summary (Last 24 hours) at 04/05/12 0945 Last data filed at 04/05/12 0400  Gross per 24 hour  Intake    940 ml  Output      0 ml  Net    940 ml  Diarrhea on 6/18  Weight Status:  71.5 kg - wt trending down likely 2/2 TF being held  Body mass index is 20.24 kg/(m^2). WNL  Re-estimated needs:  1850-1950 kcals, 105-120 grams protein daily  Nutrition Dx:  Inadequate oral intake - ongoing.  Goal:  Intake to meet 90-100% of estimated nutrition needs, unmet at this time  Intervention:   Resume TF as medically able, continue Jevity 1.2 at 55 ml/hr. RD to continue to follow.  Monitor:  Weights, labs, PO intake, I/O's, vent use  Adair Laundry Pager #:  [phone]

## 2012-04-05 NOTE — Progress Notes (Signed)
Patient placed back on trach collar from CPAP/PS at 0058 per MD. Camie Patience well sat 97%

## 2012-04-05 NOTE — Progress Notes (Signed)
34 Days Post-Op  Subjective: Percutaneous gastric tube placed 6/18 Intact Pt has some pain at site  Objective: Vital signs in last 24 hours: Temp:  [97.6 F (36.4 C)-98.7 F (37.1 C)] 98.7 F (37.1 C) (06/19 0534) Pulse Rate:  [87-108] 100  (06/19 0534) Resp:  [16-36] 29  (06/19 0534) BP: (111-152)/(21-96) 111/78 mmHg (06/19 0534) SpO2:  [87 %-98 %] 93 % (06/19 0534) FiO2 (%):  [40 %] 40 % (06/19 0534) Weight:  [157 lb 10.1 oz (71.5 kg)] 157 lb 10.1 oz (71.5 kg) (06/19 0003) Last BM Date: 04/04/12  Intake/Output from previous day: 06/18 0701 - 06/19 0700 In: 960 [I.V.:110; NG/GT:600; IV Piggyback:250] Out: -  Intake/Output this shift:    PE:  Afeb; wbc 11.0 VSS G tube intact; no bleeding; no sign of infection NT Clean and dry  Lab Results:   [facility] 04/05/12 0417 04/04/12 0428  WBC 11.0* 9.7  HGB 11.7* 11.0*  HCT 36.7* 34.0*  PLT 239 224   BMET  Basename 04/05/12 0417 04/04/12 0428  NA 138 139  K 3.4* 3.7  CL 103 104  CO2 28 25  GLUCOSE 72 89  BUN 13 9  CREATININE 0.77 0.56  CALCIUM 8.8 8.7   PT/INR  Basename 04/03/12 1421  LABPROT 14.9  INR 1.15   ABG No results found for this basename: PHART:2,PCO2:2,PO2:2,HCO3:2 in the last 72 hours  Studies/Results: Ir Gastrostomy Tube Mod Sed  04/04/2012  *RADIOLOGY REPORT*  Clinical history:History of abdominal surgery, abdominal open wound and respiratory difficulties.  The patient needs a gastrostomy tube for nutrition.  PROCEDURE(S): PERCUTANEOUS GASTROSTOMY TUBE WITH FLUOROSCOPIC GUIDANCE  Physician: Rachelle Hora. Lowella Dandy, MD  Medications:Versed 4.00mg , Fentanyl 200 mcg  Moderate sedation time:45 minutes  Fluoroscopy time: 13.1 minutes  Procedure:Informed consent was obtained for a percutaneous gastrostomy tube.  The patient was placed on the interventional table.  Fluoroscopy demonstrated oral contrast in the transverse colon.  An orogastric tube was placed with fluoroscopic guidance. The anterior abdomen was prepped  and draped in sterile fashion. Maximal barrier sterile technique was utilized including caps, mask, sterile gowns, sterile gloves, sterile drape, hand hygiene and skin antiseptic.  Stomach was inflated with air through the orogastric tube.  The skin and subcutaneous tissues were anesthetized with 1% lidocaine.  A 17 gauge needle was directed into the distended stomach with fluoroscopic guidance.  A wire was advanced towards the stomach and a T-tact was deployed.  The wire did not appear to be in the correct position.  A 5-French catheter was placed and a small amount contrast was injected.  The catheter was located within the peritoneal space.  The catheter was completely removed.  The needle was directed back into the stomach with fluoroscopy and a stiff Amplatz wire was placed.  5-French catheter was advanced over the wire and contrast injection confirmed placement in the stomach.  A 9-French vascular sheath was placed and the orogastric tube was snared using a Gooseneck snare device.  The orogastric tube and snare were pulled out of the patient's mouth.  The snare device was connected to a 20-French gastrostomy tube.  The snare device and gastrostomy tube were pulled through the patient's mouth and out the anterior abdominal wall.  The gastrostomy tube was cut to an appropriate length. Contrast injection through gastrostomy tube confirmed placement within the stomach.  Fluoroscopic images were obtained for documentation.  The gastrostomy tube was flushed with normal saline.  Findings:Gastrostomy tube within the stomach. Small amount of contrast within the  peritoneal cavity from the initial injection.  Complications: None  Impression:Successful fluoroscopic guided percutaneous gastrostomy tube placement.  Original Report Authenticated By: Richarda Overlie, M.D.   Dg Abd Portable 1v  04/04/2012  *RADIOLOGY REPORT*  Clinical Data: Evaluate bowel opacification prior to G-tube placement.  PORTABLE ABDOMEN - 1 VIEW   Comparison: 03/23/2012  Findings: There is a feeding tube with the tip near the ligament of Treitz.  There is contrast within the transverse colon.  The splenic flexure and hepatic flexure are also opacified. No evidence for small bowel distention.  IMPRESSION: Opacification of the transverse colon.  Original Report Authenticated By: Richarda Overlie, M.D.    Anti-infectives:   Assessment/Plan: s/p Procedure(s) (LRB): DEBRIDEMENT CLOSURE/ABDOMINAL WOUND (N/A)  Percutaneous Gastric tube placed 6/18 Intact; afeb; clean and dry May use now Call us if need [place]  Carnie Bruemmer A 04/05/2012

## 2012-04-05 NOTE — Progress Notes (Signed)
Name: Michael Ellison MRN: [id number] DOB: 1946/10/02    LOS: 41  PULMONARY / CRITICAL CARE MEDICINE  Pt Profile:   66 y/o male smoker admitted to [facility] 02/24/2012 with n/v and abdominal pain due to SBO due to adhesions.  Had ex lap with SB resection 5/10.  Developed delirium post-op.  Transferred to [facility] 5/14 for respiratory failure requiring intubation.  Developed abdominal fascial dehiscence and taken back to OR 5/16.  Failed extubation, and required tracheostomy 5/22. PMHx ETOH, HTN, Prostate cancer  Interval hx:  5/16: OR for fascial dehiscence repair- closed with wound vac 5/18- failed weaning 5/19- extubated 5/21- re-intubated for hypercarbia 5/22-improved co2 and neurostatus, pos almost 2 liters 5/22 SVT>>resolved spontaneously 5/30 -off vent for 24 hours, planned Panda placement 6/1 - up in chair, improved. 6/3 - desat, increased WOB, vent 6/3 - leak trach, improved with extra long 6/5 - FEES eval: failed, still showing evidence of aspiration.  6/16 - Few liquid stools with streaks of blood,  H/h stable.  (has been having mucoid / bloody streaked stool -surgeons aware)  Subjective: Acute desaturation on PS this AM, requiring full vent support with 100% and high PEEP significant lavage.  Short episode of bradycardia during suctioning and desat.  Vital Signs: Temp:  [97.6 F (36.4 C)-98.7 F (37.1 C)] 97.8 F (36.6 C) (06/19 0816) Pulse Rate:  [92-106] 105  (06/19 0816) Resp:  [27-36] 28  (06/19 0816) BP: (111-127)/(75-87) 115/80 mmHg (06/19 0816) SpO2:  [87 %-98 %] 90 % (06/19 0935) FiO2 (%):  [40 %-60 %] 60 % (06/19 0947) Weight:  [71.5 kg (157 lb 10.1 oz)] 71.5 kg (157 lb 10.1 oz) (06/19 0003)  Physical Examination: General - Alert and interactive. HEENT - trach site clean, #6.0 xlt Cardiac - regular, no m/r/g Chest -a few scattered crackles, no wheezing, good airflow. Abd - round/soft, bs+ Ext - no edema Neuro - awake and interacting, moves all  ext  Intake/Output Summary (Last 24 hours) at 04/05/12 1036 Last data filed at 04/05/12 0400  Gross per 24 hour  Intake    930 ml  Output      0 ml  Net    930 ml   CBC  Lab 04/05/12 0417 04/04/12 0428 04/03/12 0400  HGB 11.7* 11.0* 10.4*  HCT 36.7* 34.0* 32.4*  WBC 11.0* 9.7 7.9  PLT 239 224 221   BMET  Lab 04/05/12 0417 04/04/12 0428 04/03/12 0400 04/02/12 0012 03/31/12 0347 03/30/12 0400  NA 138 139 134* 134* 138 --  K 3.4* 3.7 -- -- -- --  CL 103 104 100 99 100 --  CO2 28 25 28 28 30  --  GLUCOSE 72 89 105* 96 94 --  BUN 13 9 9 11 13  --  CREATININE 0.77 0.56 0.53 0.54 0.65 --  CALCIUM 8.8 8.7 8.5 8.3* 8.7 --  MG 1.6 1.8 -- -- -- 1.7  PHOS 3.0 -- -- -- -- 2.7   Lab Results  Component Value Date   ALT 10 03/29/2012   AST 13 03/29/2012   ALKPHOS 49 03/29/2012   BILITOT 0.3 03/29/2012    Intake/Output Summary (Last 24 hours) at 04/05/12 1032 Last data filed at 04/05/12 0400  Gross per 24 hour  Intake    930 ml  Output      0 ml  Net    930 ml   Medications reviewed   ASSESSMENT AND PLAN  Acute hypoxic/hypercapnic respiratory failure 2nd to delirium, aspiration pneumonia, 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  Failed extubation, and s/p trach 5/22. Xtra long 6 cuffed 6/3>>>   Has not been able to come off vent support. Think deconditioning and Pneumonia contributing to this. He has both Klebsiella and Stenotrophomonas. Making some progress after apparent mucous plugging.   6/19 Acute desat, brady, requiring 100% and high PEEP to address.   Plan: -Cont scheduled BDs, will continue ICS -Maintain on full vent for now, will check CXR and if visible atelectasis may have to move to bronch, once stable restart PS trials again. -Cont vibravest.  SBO s/p lap with SB resection 5/10 complicated by post-op ileus and fascial dehiscence, and return to OR 5/16. Chronic pain. Dysphagia  Plan: - Post-op care per CCS, appreciate wound care - Discussed w Dr  Janee Morn >> open PEG would be very complicated; IR placed PEG, appreciate input. - Repeat swallow eval pending.  Hopeful to allow to eat but likely will require PEG due to prolonged illness for now.     Mild diarrhea with blood streaks: Small quantity, cdiff negative, CBC stable.  If worsens, will need to change lovenox to SCD.  He is at very high risk for TED, so would like to leave on lovenox if possible.    Plan: - No change in abx, continue levofloxacin for stenotrophomonas.    Hypokalemia -noted 6/17  Plan: -monitor, replete PRN  Hypertension-on lisinopril  / norvasc at home.  Has been on Q8 IV metoprolol.  Now tolerating TF's.  Plan: -change to lopressor PO 6/17 (12.5 bid)  Now that PEG is in place, will begin looking for vent SNF as with these frequent episodes of desaturation, mucous plugging and bradycardia doubt will be able to get of the vent long term while abdomen is still an active issue.  Alyson Reedy, M.D. [facility] Pulmonary/Critical Care Medicine. Pager: [phone]. After hours pager: [phone].

## 2012-04-05 NOTE — Progress Notes (Signed)
Michael Ellison, Michael Indication: pneumonia  Allergies  Allergen Reactions  . Lorazepam Other (See Comments)    Severe agitated delirium. Tolerates midazolam and other benzo's    Patient Measurements: Height: 6\' 2"  (188 cm) Weight: 157 lb 10.1 oz (71.5 kg) IBW/kg (Calculated) : 82.2   Vital Signs: Temp: 97.8 F (36.6 C) (06/19 0816) Temp src: Oral (06/19 0816) BP: 115/80 mmHg (06/19 0816) Pulse Rate: 105  (06/19 0816) Intake/Output from previous day: 06/18 0701 - 06/19 0700 In: 960 [I.V.:110; NG/GT:600; IV Piggyback:250] Out: -  Intake/Output from this shift:    Labs:  Basename 04/05/12 0417 04/04/12 0428 04/03/12 0400  WBC 11.0* 9.7 7.9  HGB 11.7* 11.0* 10.4*  PLT 239 224 221  LABCREA -- -- --  CREATININE 0.77 0.56 0.53   Estimated Creatinine Clearance: 93.1 ml/min (by C-G formula based on Cr of 0.77). No results found for this basename: VANCOTROUGH:2,VANCOPEAK:2,VANCORANDOM:2,GENTTROUGH:2,GENTPEAK:2,GENTRANDOM:2,TOBRATROUGH:2,TOBRAPEAK:2,TOBRARND:2,AMIKACINPEAK:2,AMIKACINTROU:2,AMIKACIN:2, in the last 72 hours   Microbiology: Recent Results (from the past 720 hour(s))  CULTURE, BLOOD (ROUTINE X 2)     Status: Normal   Collection Time   03/06/12  2:40 PM      Component Value Range Status Comment   Specimen Description BLOOD RIGHT ANTECUBITAL   Final    Special Requests BOTTLES DRAWN AEROBIC AND ANAEROBIC 10CC   Final    Culture  Setup Time [id number]   Final    Culture NO GROWTH 5 DAYS   Final    Report Status 03/12/2012 FINAL   Final   CULTURE, BLOOD (ROUTINE X 2)     Status: Normal   Collection Time   03/06/12  2:45 PM      Component Value Range Status Comment   Specimen Description BLOOD RIGHT ARM   Final    Special Requests BOTTLES DRAWN AEROBIC AND ANAEROBIC 10CC   Final    Culture  Setup Time [id number]   Final    Culture NO GROWTH 5 DAYS   Final    Report Status 03/12/2012 FINAL   Final     CULTURE, RESPIRATORY     Status: Normal   Collection Time   03/08/12 10:26 AM      Component Value Range Status Comment   Specimen Description TRACHEAL ASPIRATE   Final    Special Requests NONE   Final    Gram Stain     Final    Value: MODERATE WBC PRESENT,BOTH PMN AND MONONUCLEAR     FEW SQUAMOUS EPITHELIAL CELLS PRESENT     NO ORGANISMS SEEN   Culture     Final    Value: ABUNDANT KLEBSIELLA PNEUMONIAE     ABUNDANT ENTEROBACTER CLOACAE   Report Status 03/11/2012 FINAL   Final    Organism ID, Bacteria KLEBSIELLA PNEUMONIAE   Final    Organism ID, Bacteria ENTEROBACTER CLOACAE   Final   CULTURE, RESPIRATORY     Status: Normal   Collection Time   03/24/12  1:53 PM      Component Value Range Status Comment   Specimen Description BRONCHIAL WASHINGS   Final    Special Requests Normal   Final    Gram Stain     Final    Value: ABUNDANT WBC PRESENT, PREDOMINANTLY PMN     NO SQUAMOUS EPITHELIAL CELLS SEEN     FEW GRAM POSITIVE COCCI IN PAIRS     IN CLUSTERS   Culture     Final    Value: FEW  STENOTROPHOMONAS MALTOPHILIA     FEW KLEBSIELLA PNEUMONIAE   Report Status 03/28/2012 FINAL   Final    Organism ID, Bacteria STENOTROPHOMONAS MALTOPHILIA   Final    Organism ID, Bacteria KLEBSIELLA PNEUMONIAE   Final   CLOSTRIDIUM DIFFICILE BY PCR     Status: Normal   Collection Time   04/01/12 12:14 PM      Component Value Range Status Comment   C difficile by pcr NEGATIVE  NEGATIVE Final     Anti-infectives     Start     Dose/Rate Route Frequency Ordered Stop   03/28/12 1500   levofloxacin (Michael) IVPB 750 mg        750 mg 100 mL/hr over 90 Minutes Intravenous Every 24 hours 03/28/12 1348     03/27/12 1600   vancomycin (VANCOCIN) 1,250 mg in sodium chloride 0.9 % 250 mL IVPB  Status:  Discontinued        1,250 mg 166.7 mL/hr over 90 Minutes Intravenous Every 12 hours 03/27/12 0541 03/28/12 1348   03/25/12 0600   vancomycin (VANCOCIN) 750 mg in sodium chloride 0.9 % 150 mL IVPB  Status:   Discontinued        750 mg 150 mL/hr over 60 Minutes Intravenous Every 12 hours 03/24/12 1423 03/27/12 0541   03/24/12 1600   vancomycin (VANCOCIN) 1,500 mg in sodium chloride 0.9 % 500 mL IVPB        1,500 mg 250 mL/hr over 120 Minutes Intravenous  Once 03/24/12 1423 03/24/12 1856   03/24/12 1530   Ellison (FORTAZ) 1 g in dextrose 5 % 50 mL IVPB        1 g 100 mL/hr over 30 Minutes Intravenous Every 8 hours 03/24/12 1423     03/24/12 1400   vancomycin (VANCOCIN) IVPB 1000 mg/200 mL premix  Status:  Discontinued        1,000 mg 200 mL/hr over 60 Minutes Intravenous Every 12 hours 03/24/12 1353 03/24/12 1400   03/03/12 1800   vancomycin (VANCOCIN) 1,750 mg in sodium chloride 0.9 % 500 mL IVPB  Status:  Discontinued        1,750 mg 250 mL/hr over 120 Minutes Intravenous Every 12 hours 03/03/12 0628 03/03/12 0918   02/29/12 1900   micafungin (MYCAMINE) 100 mg in sodium chloride 0.9 % 100 mL IVPB  Status:  Discontinued        100 mg 100 mL/hr over 1 Hours Intravenous Every 24 hours 02/29/12 1754 03/01/12 0933   02/29/12 1730   vancomycin (VANCOCIN) 1,250 mg in sodium chloride 0.9 % 250 mL IVPB  Status:  Discontinued        1,250 mg 166.7 mL/hr over 90 Minutes Intravenous Every 12 hours 02/29/12 1644 03/03/12 0628   02/29/12 1715   vancomycin (VANCOCIN) IVPB 1000 mg/200 mL premix  Status:  Discontinued        1,000 mg 200 mL/hr over 60 Minutes Intravenous  Once 02/29/12 1637 02/29/12 1647   02/29/12 1645   piperacillin-tazobactam (ZOSYN) IVPB 3.375 g  Status:  Discontinued        3.375 g 100 mL/hr over 30 Minutes Intravenous  Once 02/29/12 1637 02/29/12 1641   02/28/12 1800   piperacillin-tazobactam (ZOSYN) IVPB 3.375 g  Status:  Discontinued        3.375 g 12.5 mL/hr over 240 Minutes Intravenous 4 times per day 02/28/12 1454 02/28/12 1510   02/28/12 1800   piperacillin-tazobactam (ZOSYN) IVPB 3.375 g  Status:  Discontinued  3.375 g 12.5 mL/hr over 240 Minutes  Intravenous Every 8 hours 02/28/12 1510 03/14/12 1700   02/25/12 1109   dextrose 5 % with cefOXitin (MEFOXIN) ADS Med     Comments: HOLLIE, CHRISTINA: cabinet override         02/25/12 1109 02/25/12 2314   02/25/12 1100   cefOXitin (MEFOXIN) 1 g in dextrose 5 % 50 mL IVPB  Status:  Discontinued        1 g 100 mL/hr over 30 Minutes Intravenous 60 min pre-op 02/25/12 1100 02/25/12 1542          Assessment: Pneumonia:  Currently on Day #13 of Ellison and Day #9 of Michael for Stenotrophomonas and Klebsiella isolated from most recent respiratory cultures.  It appears all of his organisms are susceptible to Michael.  Goal of Therapy:  Appropriate antimicrobial therapy  Plan:  Continue Ellison 1gm IV q8h and Michael 750mg  IV q24h.  Could consider narrowing to Michael alone and complete 14 days of therapy.  Estella Husk, Pharm.D., BCPS Clinical Pharmacist  Phone [phone] Pager [phone] 04/05/2012, 10:55 AM

## 2012-04-05 NOTE — Progress Notes (Signed)
OT Cancellation Note  Treatment cancelled today due to medical issues with patient which prohibited therapy. Pt just placed back on vent with O2 sats in the 60s. Will check back as schedule permits.  Alaira Level A OTR/L [phone] 04/05/2012, 9:29 AM

## 2012-04-05 NOTE — Progress Notes (Signed)
PT Cancellation Note  Treatment cancelled today due to medical issues with patient which prohibited therapy.  Michael Ellison,Michael Ellison 04/05/2012, 9:30 AM  [facility] [phone] (pager)

## 2012-04-05 NOTE — Progress Notes (Signed)
Clinical Social Worker reviewed chart and staffed case with RNCM.  CSW met with pt at bedside.  CSW introduced self to pt and explained that this CSW was now assigned to pt's current unit-[id number].  CSW explained role and reviewed plan of care.  Pt understands that he would benefit from skilled rehab and that there are only a few facilities that offer skilled rehab with ventilated pts.  CSW reviewed all current options being considers; [facility], [facility], [place], [place],  [place], & [place], [place].  CSW explained that sometimes a skilled bed will become available in [place] first.  In this case a pt will transfer to [place] and remain on the wait list at the facilities in [facility].  Pt agreeable and stated his first preference would be [facility] followed by [facility].  CSW stated all efforts will be made for pt to dc to his preference however CSW expressed the importance of pt being able to receive rehab at a skilled facility as soon as medically ready and this sometimes means going to [place] first.  Pt stated he understood.    CSW provided emotional support and validated the difficulty of current situation and the feelings sometimes associated with this situation; anxiety, fearfulness, frustration.  CSW encouraged pt and reviewed the strong capabilities of pt's current team of MDs, RNs, RN techs, RNCM, & CSW.  CSW also reviewed current support system; pt has several dtrs who are all very involved in his care.  Pt currently appears agreeable to plan of care.  CSW attempted to phone pt's dtr Tamika to update information and to introduce self.  CSW left voicemail messages on dtr's work phone and home phone.  CSW to continue to follow and assist as needed.   Angelia Mould, MSW, [place] [phone]

## 2012-04-05 NOTE — Progress Notes (Signed)
Noted that patient has a pegtube that is clamped.  No tube feeding at this time. Will notify MD in AM.

## 2012-04-06 ENCOUNTER — Encounter (HOSPITAL_COMMUNITY): Payer: Self-pay | Admitting: Emergency Medicine

## 2012-04-06 LAB — BASIC METABOLIC PANEL
BUN: 15 mg/dL (ref 6–23)
Chloride: 99 mEq/L (ref 96–112)
Creatinine, Ser: 0.68 mg/dL (ref 0.50–1.35)
Glucose, Bld: 75 mg/dL (ref 70–99)
Potassium: 3.2 mEq/L — ABNORMAL LOW (ref 3.5–5.1)

## 2012-04-06 LAB — CBC
HCT: 34.7 % — ABNORMAL LOW (ref 39.0–52.0)
Hemoglobin: 11.5 g/dL — ABNORMAL LOW (ref 13.0–17.0)
MCHC: 33.1 g/dL (ref 30.0–36.0)
MCV: 83.4 fL (ref 78.0–100.0)
WBC: 9.7 10*3/uL (ref 4.0–10.5)

## 2012-04-06 MED ORDER — JEVITY 1.2 CAL PO LIQD
1000.0000 mL | ORAL | Status: DC
  Administered 2012-04-06 – 2012-04-22 (×13): 1000 mL
  Filled 2012-04-06 (×29): qty 1000

## 2012-04-06 MED ORDER — MAGNESIUM SULFATE 40 MG/ML IJ SOLN
2.0000 g | Freq: Once | INTRAMUSCULAR | Status: AC
  Administered 2012-04-06: 2 g via INTRAVENOUS
  Filled 2012-04-06: qty 50

## 2012-04-06 MED ORDER — FUROSEMIDE 10 MG/ML IJ SOLN
40.0000 mg | Freq: Three times a day (TID) | INTRAMUSCULAR | Status: AC
  Administered 2012-04-06 (×2): 40 mg via INTRAVENOUS
  Filled 2012-04-06 (×3): qty 4

## 2012-04-06 MED ORDER — PRO-STAT SUGAR FREE PO LIQD
30.0000 mL | Freq: Two times a day (BID) | ORAL | Status: DC
  Administered 2012-04-06 – 2012-04-24 (×36): 30 mL
  Filled 2012-04-06 (×38): qty 30

## 2012-04-06 MED ORDER — DEXTROSE 5 % IV SOLN
30.0000 mmol | Freq: Once | INTRAVENOUS | Status: AC
  Administered 2012-04-06: 30 mmol via INTRAVENOUS
  Filled 2012-04-06 (×2): qty 10

## 2012-04-06 MED ORDER — POTASSIUM CHLORIDE 20 MEQ/15ML (10%) PO LIQD
40.0000 meq | Freq: Once | ORAL | Status: AC
  Administered 2012-04-06: 40 meq
  Filled 2012-04-06: qty 30

## 2012-04-06 NOTE — Progress Notes (Signed)
Name: Michael Ellison MRN: [id number] DOB: August 19, 1946    LOS: 42  PULMONARY / CRITICAL CARE MEDICINE  Pt Profile:   66 y/o male smoker admitted to [facility] 02/24/2012 with n/v and abdominal pain due to SBO due to adhesions.  Had ex lap with SB resection 5/10.  Developed delirium post-op.  Transferred to [facility] 5/14 for respiratory failure requiring intubation.  Developed abdominal fascial dehiscence and taken back to OR 5/16.  Failed extubation, and required tracheostomy 5/22. PMHx ETOH, HTN, Prostate cancer  Interval hx:  5/16: OR for fascial dehiscence repair- closed with wound vac 5/18- failed weaning 5/19- extubated 5/21- re-intubated for hypercarbia 5/22-improved co2 and neurostatus, pos almost 2 liters 5/22 SVT>>resolved spontaneously 5/30 -off vent for 24 hours, planned Panda placement 6/1 - up in chair, improved. 6/3 - desat, increased WOB, vent 6/3 - leak trach, improved with extra long 6/5 - FEES eval: failed, still showing evidence of aspiration.  6/16 - Few liquid stools with streaks of blood,  H/h stable.  (has been having mucoid / bloody streaked stool -surgeons aware)  Subjective: Acute desaturation on PS this AM, requiring full vent support with 100% and high PEEP significant lavage.  Short episode of bradycardia during suctioning and desat.  Vital Signs: Temp:  [97.7 F (36.5 C)-99.2 F (37.3 C)] 98.4 F (36.9 C) (06/20 0819) Pulse Rate:  [86-109] 101  (06/20 0928) Resp:  [14-27] 27  (06/20 0819) BP: (108-142)/(74-88) 108/74 mmHg (06/20 0928) SpO2:  [88 %-100 %] 97 % (06/20 0918) FiO2 (%):  [40 %-50 %] 40 % (06/20 0918) Weight:  [73.1 kg (161 lb 2.5 oz)] 73.1 kg (161 lb 2.5 oz) (06/20 0319)  Physical Examination: General - Alert and interactive. HEENT - trach site clean, #6.0 xlt Cardiac - regular, no m/r/g Chest -a few scattered crackles, no wheezing, good airflow. Abd - round/soft, bs+ Ext - no edema Neuro - awake and interacting, moves all ext  Intake/Output  Summary (Last 24 hours) at 04/06/12 1132 Last data filed at 04/06/12 0900  Gross per 24 hour  Intake    905 ml  Output    100 ml  Net    805 ml   CBC  Lab 04/06/12 0415 04/05/12 0417 04/04/12 0428  HGB 11.5* 11.7* 11.0*  HCT 34.7* 36.7* 34.0*  WBC 9.7 11.0* 9.7  PLT 226 239 224   BMET  Lab 04/06/12 0415 04/05/12 0417 04/04/12 0428 04/03/12 0400 04/02/12 0012  NA 135 138 139 134* 134*  K 3.2* 3.4* -- -- --  CL 99 103 104 100 99  CO2 26 28 25 28 28   GLUCOSE 75 72 89 105* 96  BUN 15 13 9 9 11   CREATININE 0.68 0.77 0.56 0.53 0.54  CALCIUM 8.6 8.8 8.7 8.5 8.3*  MG 1.9 1.6 1.8 -- --  PHOS 3.2 3.0 -- -- --   Lab Results  Component Value Date   ALT 10 03/29/2012   AST 13 03/29/2012   ALKPHOS 49 03/29/2012   BILITOT 0.3 03/29/2012    Intake/Output Summary (Last 24 hours) at 04/06/12 1132 Last data filed at 04/06/12 0900  Gross per 24 hour  Intake    905 ml  Output    100 ml  Net    805 ml   Medications reviewed  ASSESSMENT AND PLAN  Acute hypoxic/hypercapnic respiratory failure 2nd to delirium, aspiration pneumonia, 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  Failed extubation, and s/p trach 5/22. Xtra long 6 cuffed 6/3>>>  Has not been able to come off vent support. Think deconditioning and Pneumonia contributing to this. He has both Klebsiella and Stenotrophomonas. Making some progress after apparent mucous plugging.   6/19 Acute desat, brady, requiring 100% and high PEEP to address.   Plan: -Cont scheduled BDs, will continue ICS -Maintain on full vent for now, will check CXR and if visible atelectasis may have to move to bronch, once stable restart PS trials again. -Cont vibravest.  SBO s/p lap with SB resection 5/10 complicated by post-op ileus and fascial dehiscence, and return to OR 5/16. Chronic pain. Dysphagia  Plan: - Post-op care per CCS, appreciate wound care - Discussed w Dr Janee Morn >> open PEG would be very complicated; IR placed PEG,  appreciate input. - Repeat swallow eval pending.  Hopeful to allow to eat but likely will require PEG due to prolonged illness for now.     Mild diarrhea with blood streaks: Small quantity, cdiff negative, CBC stable.  If worsens, will need to change lovenox to SCD.  He is at very high risk for TED, so would like to leave on lovenox if possible.    Plan: - No change in abx, continue levofloxacin for stenotrophomonas.    Hypokalemia -noted 6/17  Plan: -monitor, replete PRN  Hypertension-on lisinopril  / norvasc at home.  Has been on Q8 IV metoprolol.  Now tolerating TF's.  Plan: -change to lopressor PO 6/17 (12.5 bid)  Now that PEG is in place, will begin looking for vent SNF as with these frequent episodes of desaturation, mucous plugging and bradycardia doubt will be able to get of the vent long term while abdomen is still an active issue.  Alyson Reedy, M.D. [facility] Pulmonary/Critical Care Medicine. Pager: [phone]. After hours pager: [phone].

## 2012-04-06 NOTE — Progress Notes (Signed)
OT Cancellation Note  Treatment cancelled today due to patient's refusal to participate. Pt fatigued from walking with PT. Just returned back to bed. Will re-attempt as schedule permits.  Krayton Wortley A OTR/L [phone] 04/06/2012, 11:43 AM

## 2012-04-06 NOTE — Progress Notes (Signed)
Pt on the vent today, will defer treatment. Harlon Ditty, MA CCC-SLP [phone]

## 2012-04-06 NOTE — Progress Notes (Addendum)
Nutrition Follow-up/Consult  RD consulted to initiate enteral nutrition via PEG.  Diet Order:  NPO  Free water of 200 ml q 4 hours; provides an additional 1200 ml daily.  Meds: Scheduled Meds:    . albuterol  2.5 mg Nebulization QID  . antiseptic oral rinse  15 mL Mouth Rinse QID  . budesonide  0.5 mg Nebulization BID  . cefTAZidime (FORTAZ)  IV  1 g Intravenous Q8H  . chlorhexidine  15 mL Mouth Rinse BID  . enoxaparin  40 mg Subcutaneous Q24H  . escitalopram  10 mg Oral Daily  . feeding supplement  30 mL Per Tube BID  . fentaNYL  100 mcg Intravenous Once  . free water  200 mL Per Tube Q4H  . furosemide  40 mg Intravenous Q8H  . levofloxacin (LEVAQUIN) IV  750 mg Intravenous Q24H  . magnesium sulfate 1 - 4 g bolus IVPB  2 g Intravenous Once  . magnesium sulfate 1 - 4 g bolus IVPB  2 g Intravenous Once  . metoprolol tartrate  12.5 mg Per Tube BID  . midazolam  2 mg Intravenous Once  . pantoprazole sodium  40 mg Per Tube QHS  . potassium chloride  40 mEq Per Tube Once  . potassium chloride  40 mEq Per Tube Once  . potassium phosphate IVPB (mmol)  30 mmol Intravenous Once  . potassium phosphate IVPB (mmol)  30 mmol Intravenous Once  . sodium chloride  3 mL Intravenous Q12H  . DISCONTD: feeding supplement  30 mL Per Tube TID WC   Continuous Infusions:    . sodium chloride 10 mL (04/04/12 0603)  . feeding supplement (JEVITY 1.2 CAL) 1,000 mL (04/03/12 1600)   PRN Meds:.albuterol, ALPRAZolam, bisacodyl, fentaNYL, hydrALAZINE, phenol  Labs:   Sodium now WNL, potassium remains low. CMP     Component Value Date/Time   NA 135 04/06/2012 0415   K 3.2* 04/06/2012 0415   CL 99 04/06/2012 0415   CO2 26 04/06/2012 0415   GLUCOSE 75 04/06/2012 0415   BUN 15 04/06/2012 0415   CREATININE 0.68 04/06/2012 0415   CALCIUM 8.6 04/06/2012 0415   PROT 6.1 03/29/2012 0420   ALBUMIN 1.9* 03/29/2012 0420   AST 13 03/29/2012 0420   ALT 10 03/29/2012 0420   ALKPHOS 49 03/29/2012 0420   BILITOT 0.3  03/29/2012 0420   GFRNONAA >90 04/06/2012 0415   GFRAA >90 04/06/2012 0415     Intake/Output Summary (Last 24 hours) at 04/06/12 1313 Last data filed at 04/06/12 0900  Gross per 24 hour  Intake    905 ml  Output    100 ml  Net    805 ml  Diarrhea on 6/18  Weight Status:  73.1 kg - stable  Body mass index is 20.69 kg/(m^2). WNL  Re-estimated needs:  1750-1850 kcals, 90 - 110 grams protein daily  Nutrition Dx:  Inadequate oral intake - ongoing.  Goal:  Intake to meet 90-100% of estimated nutrition needs, unmet at this time  Intervention:    1. Initiate Jevity 1.2 at 25 ml/hr, advance by 10 ml/hr every 4 hours as to goal of 55 ml/hr. 30 ml Prostat via tube BID. Total TF regimen will provide: 1784 kcal, 104 grams protein, 1065 ml free water. With current water flush order, this provides a total of 2265 ml water. 2. RD to continue to follow nutrition care plan  Monitor:  Weights, labs, PO intake, I/O's, vent use  [place] Pager #:  [phone] 

## 2012-04-06 NOTE — Progress Notes (Signed)
Physical Therapy Treatment Patient Details Name: Michael Ellison MRN: [id number] DOB: Dec 08, 1945 Today's Date: 04/06/2012 Time: 0840-0920 PT Time Calculation (min): 40 min  PT Assessment / Plan / Recommendation Comments on Treatment Session  Patient s/p SB resection, VDRF, and trach/PEG.  Patient progressed ambulation again today.  Was incontinent and needed to be cleaned of bowel and bladder.  RT also had to suction after ambulation.  Had to ambulate on 50% FiO2 to keep sats >90%.    Follow Up Recommendations  Skilled nursing facility;Supervision/Assistance - 24 hour    Barriers to Discharge  None      Equipment Recommendations  Defer to next venue    Recommendations for Other Services  None  Frequency Min 3X/week   Plan Discharge plan remains appropriate;Frequency remains appropriate    Precautions / Restrictions Precautions Precautions: Fall Precaution Comments: trach and PEG Restrictions Weight Bearing Restrictions: No   Pertinent Vitals/Pain FiO2 at 50% to keep sats >90%, no c/o pain    Mobility  Bed Mobility Bed Mobility: Rolling Left;Left Sidelying to Sit;Sitting - Scoot to Edge of Bed Rolling Right: Not tested (comment) Rolling Left: 4: Min assist Right Sidelying to Sit: Not tested (comment) Left Sidelying to Sit: 4: Min assist;With rails;HOB elevated Supine to Sit: Not tested (comment) Sitting - Scoot to Edge of Bed: 4: Min guard Sit to Supine: Not Tested (comment) Details for Bed Mobility Assistance: Patient still sore from PEG placement.  Needed incr assist.   Transfers Transfers: Sit to Stand;Stand to Sit Sit to Stand: 4: Min guard;With upper extremity assist;From bed Stand to Sit: 4: Min guard;With upper extremity assist;To chair/3-in-1 Stand Pivot Transfers: Not tested (comment) Details for Transfer Assistance: cues for hand placement and safety Ambulation/Gait Ambulation/Gait Assistance: 4: Min guard Ambulation Distance (Feet): 145 Feet Assistive  device: Rolling walker Ambulation/Gait Assistance Details: Patient needed steadying assist to maintain balance at times.  Took several standing rest breaks.  Patient coughed up a lot of mucous while ambulating and got RT to suction on return.  Patient also stated that he had to use bathroom upon returning to room and did not make it to bathroom before being incontinent of bowels and bladder.   Gait Pattern: Step-through pattern;Decreased stride length Gait velocity: decreased Stairs: No Wheelchair Mobility Wheelchair Mobility: No     PT Goals Acute Rehab PT Goals PT Goal: Supine/Side to Sit - Progress: Progressing toward goal PT Goal: Sit at Edge Of Bed - Progress: Progressing toward goal PT Goal: Sit to Stand - Progress: Progressing toward goal PT Goal: Stand to Sit - Progress: Progressing toward goal PT Goal: Ambulate - Progress: Progressing toward goal  Visit Information  Last PT Received On: 04/06/12 Assistance Needed: +2    Subjective Data  Subjective: "I need to go to the bathroom," pt stated as we re-entered room from walking in the hall.     Cognition  Overall Cognitive Status: Appears within functional limits for tasks assessed/performed Difficult to assess due to: Tracheostomy Arousal/Alertness: Awake/alert Orientation Level: Appears intact for tasks assessed Behavior During Session: [facility] for tasks performed    Balance  Static Sitting Balance Static Sitting - Balance Support: Bilateral upper extremity supported;Feet supported Static Sitting - Level of Assistance: 6: Modified independent (Device/Increase time) Static Sitting - Comment/# of Minutes: 2 Static Standing Balance Static Standing - Balance Support: Bilateral upper extremity supported;During functional activity Static Standing - Level of Assistance: 4: Min assist  End of Session PT - End of Session Equipment Utilized During Treatment:  Gait belt Activity Tolerance: Patient tolerated treatment well Patient  left: in chair;with call bell/phone within reach Nurse Communication: Mobility status    INGOLD,Absalom Aro 04/06/2012, 9:59 AM  [facility] [phone] (pager)

## 2012-04-06 NOTE — Progress Notes (Signed)
Clinical Social Worker spoke by phone with pt's dtr, Wandra Mannan [phone].  CSW reviewed plan of care with dtr.  CSW provided opportunity for dtr to process questions/concenrs.  Dtr currently in agreement/understanding of current plan of care; pt may need to go to [facility] while waiting for a bed to become available in [place].  Dtr to complete [facility] Application.  Dtr thanked CSW for intervention.  CSW to continue to follow and assist as needed.    Angelia Mould, MSW, [place] [phone]

## 2012-04-07 LAB — BASIC METABOLIC PANEL
BUN: 14 mg/dL (ref 6–23)
CO2: 30 mEq/L (ref 19–32)
Calcium: 8.4 mg/dL (ref 8.4–10.5)
Chloride: 98 mEq/L (ref 96–112)
Creatinine, Ser: 0.82 mg/dL (ref 0.50–1.35)
Glucose, Bld: 106 mg/dL — ABNORMAL HIGH (ref 70–99)

## 2012-04-07 LAB — CBC
HCT: 35.1 % — ABNORMAL LOW (ref 39.0–52.0)
MCH: 27.2 pg (ref 26.0–34.0)
MCV: 83.8 fL (ref 78.0–100.0)
RBC: 4.19 MIL/uL — ABNORMAL LOW (ref 4.22–5.81)
WBC: 10.2 10*3/uL (ref 4.0–10.5)

## 2012-04-07 MED ORDER — MAGNESIUM SULFATE 40 MG/ML IJ SOLN
2.0000 g | Freq: Once | INTRAMUSCULAR | Status: AC
  Administered 2012-04-07: 2 g via INTRAVENOUS
  Filled 2012-04-07: qty 50

## 2012-04-07 MED ORDER — FUROSEMIDE 10 MG/ML IJ SOLN
20.0000 mg | Freq: Three times a day (TID) | INTRAMUSCULAR | Status: AC
  Administered 2012-04-07 (×2): 20 mg via INTRAVENOUS
  Filled 2012-04-07 (×2): qty 2

## 2012-04-07 MED ORDER — SILVER NITRATE-POT NITRATE 75-25 % EX MISC
1.0000 | CUTANEOUS | Status: DC | PRN
  Filled 2012-04-07: qty 1

## 2012-04-07 MED ORDER — POTASSIUM CHLORIDE 20 MEQ/15ML (10%) PO LIQD
40.0000 meq | Freq: Three times a day (TID) | ORAL | Status: AC
  Administered 2012-04-07 (×2): 40 meq
  Filled 2012-04-07 (×2): qty 30

## 2012-04-07 NOTE — Progress Notes (Signed)
Clinical Social Worker met with pt at bedside and provided support.  CSW reviewed current coping skills and offered additional reading materials if pt would like.  CSW updated pt on conversation with dtr.  CSW to continue to follow and assist as needed.   Angelia Mould, MSW, [place] [phone]

## 2012-04-07 NOTE — Progress Notes (Signed)
Physical Therapy Treatment Patient Details Name: Michael Ellison MRN: [id number] DOB: 1946-05-09 Today's Date: 04/07/2012 Time: 4098-1191 PT Time Calculation (min): 25 min  PT Assessment / Plan / Recommendation Comments on Treatment Session  PAtient s/p SB resection, fascial dehisence, VDRF, trach and PEG.  Patient with happy affect today and with incr ambulation.  Sats much better on 40% trach collar never dipping below 90%.      Follow Up Recommendations  Skilled nursing facility;Supervision/Assistance - 24 hour    Barriers to Discharge  None      Equipment Recommendations  Defer to next venue    Recommendations for Other Services  None  Frequency Min 3X/week   Plan Discharge plan remains appropriate;Frequency remains appropriate    Precautions / Restrictions Precautions Precautions: Fall Precaution Comments: trach and PEG   Pertinent Vitals/Pain VSS, No pain    Mobility  Bed Mobility Bed Mobility: Rolling Right;Right Sidelying to Sit;Sitting - Scoot to [place] of Bed Rolling Right: 4: Min guard Rolling Left: Not tested (comment) Right Sidelying to Sit: 4: Min guard Left Sidelying to Sit: Not tested (comment) Supine to Sit: Not tested (comment) Sitting - Scoot to Edge of Bed: 4: Min guard Sit to Supine: Not Tested (comment) Details for Bed Mobility Assistance: assist for lines Transfers Transfers: Sit to Stand;Stand to Sit Sit to Stand: 4: Min guard;With upper extremity assist;From bed;From elevated surface Stand to Sit: 4: Min guard;With upper extremity assist;To chair/3-in-1 Stand Pivot Transfers: Not tested (comment) Details for Transfer Assistance: cues for hand placement Ambulation/Gait Ambulation/Gait Assistance: 4: Min guard Ambulation Distance (Feet): 200 Feet Assistive device: Rolling walker Ambulation/Gait Assistance Details: Patient with less need for standing rest breaks today.  Patient had rectal pouch and foley so no accidents today.  Patient with steady  gait today and incr distance.  Gait Pattern: Step-through pattern;Decreased stride length Stairs: No Wheelchair Mobility Wheelchair Mobility: No    PT Goals Acute Rehab PT Goals PT Goal: Supine/Side to Sit - Progress: Progressing toward goal PT Goal: Sit at Edge Of Bed - Progress: Progressing toward goal PT Goal: Sit to Stand - Progress: Progressing toward goal PT Goal: Stand to Sit - Progress: Progressing toward goal PT Transfer Goal: Bed to Chair/Chair to Bed - Progress: Progressing toward goal PT Goal: Ambulate - Progress: Progressing toward goal  Visit Information  Last PT Received On: 04/07/12 Assistance Needed: +2    Subjective Data  Subjective: "I feel good today."   Cognition  Overall Cognitive Status: Appears within functional limits for tasks assessed/performed Difficult to assess due to: Tracheostomy Arousal/Alertness: Awake/alert Orientation Level: Appears intact for tasks assessed Behavior During Session: [facility] for tasks performed    Balance  Static Sitting Balance Static Sitting - Level of Assistance: Not tested (comment) Static Standing Balance Static Standing - Balance Support: Bilateral upper extremity supported;During functional activity Static Standing - Level of Assistance: 5: Stand by assistance  End of Session PT - End of Session Equipment Utilized During Treatment: Gait belt Activity Tolerance: Patient tolerated treatment well Patient left: in chair;with call bell/phone within reach Nurse Communication: Mobility status    INGOLD,Mabeline Varas 04/07/2012, 12:28 PM  [name],PT Acute Rehabilitation [phone] (pager)

## 2012-04-07 NOTE — Progress Notes (Signed)
ANTIBIOTIC CONSULT NOTE - FOLLOW UP  Pharmacy Consult for Ceftazidime, Levaquin Indication: pneumonia  Allergies  Allergen Reactions  . Lorazepam Other (See Comments)    Severe agitated delirium. Tolerates midazolam and other benzo's    Patient Measurements: Height: 6\' 2"  (188 cm) Weight: 159 lb 2.8 oz (72.2 kg) IBW/kg (Calculated) : 82.2   Vital Signs: Temp: 98.2 F (36.8 C) (06/21 0811) Temp src: Oral (06/21 0811) BP: 108/78 mmHg (06/21 0811) Pulse Rate: 98  (06/21 0811) Intake/Output from previous day: 06/20 0701 - 06/21 0700 In: 2868 [I.V.:135; NG/GT:1200; IV Piggyback:895; TPN:63] Out: 2410 [Urine:2410] Intake/Output from this shift: Total I/O In: 115 [I.V.:10; Other:55; IV Piggyback:50] Out: -   Labs:  Basename 04/07/12 0405 04/06/12 0415 04/05/12 0417  WBC 10.2 9.7 11.0*  HGB 11.4* 11.5* 11.7*  PLT 235 226 239  LABCREA -- -- --  CREATININE 0.82 0.68 0.77   Estimated Creatinine Clearance: 91.7 ml/min (by C-G formula based on Cr of 0.82). No results found for this basename: VANCOTROUGH:2,VANCOPEAK:2,VANCORANDOM:2,GENTTROUGH:2,GENTPEAK:2,GENTRANDOM:2,TOBRATROUGH:2,TOBRAPEAK:2,TOBRARND:2,AMIKACINPEAK:2,AMIKACINTROU:2,AMIKACIN:2, in the last 72 hours   Microbiology: Recent Results (from the past 720 hour(s))  CULTURE, RESPIRATORY     Status: Normal   Collection Time   03/08/12 10:26 AM      Component Value Range Status Comment   Specimen Description TRACHEAL ASPIRATE   Final    Special Requests NONE   Final    Gram Stain     Final    Value: MODERATE WBC PRESENT,BOTH PMN AND MONONUCLEAR     FEW SQUAMOUS EPITHELIAL CELLS PRESENT     NO ORGANISMS SEEN   Culture     Final    Value: ABUNDANT KLEBSIELLA PNEUMONIAE     ABUNDANT ENTEROBACTER CLOACAE   Report Status 03/11/2012 FINAL   Final    Organism ID, Bacteria KLEBSIELLA PNEUMONIAE   Final    Organism ID, Bacteria ENTEROBACTER CLOACAE   Final   CULTURE, RESPIRATORY     Status: Normal   Collection Time   03/24/12  1:53 PM      Component Value Range Status Comment   Specimen Description BRONCHIAL WASHINGS   Final    Special Requests Normal   Final    Gram Stain     Final    Value: ABUNDANT WBC PRESENT, PREDOMINANTLY PMN     NO SQUAMOUS EPITHELIAL CELLS SEEN     FEW GRAM POSITIVE COCCI IN PAIRS     IN CLUSTERS   Culture     Final    Value: FEW STENOTROPHOMONAS MALTOPHILIA     FEW KLEBSIELLA PNEUMONIAE   Report Status 03/28/2012 FINAL   Final    Organism ID, Bacteria STENOTROPHOMONAS MALTOPHILIA   Final    Organism ID, Bacteria KLEBSIELLA PNEUMONIAE   Final   CLOSTRIDIUM DIFFICILE BY PCR     Status: Normal   Collection Time   04/01/12 12:14 PM      Component Value Range Status Comment   C difficile by pcr NEGATIVE  NEGATIVE Final     Anti-infectives     Start     Dose/Rate Route Frequency Ordered Stop   03/28/12 1500   levofloxacin (LEVAQUIN) IVPB 750 mg        750 mg 100 mL/hr over 90 Minutes Intravenous Every 24 hours 03/28/12 1348     03/27/12 1600   vancomycin (VANCOCIN) 1,250 mg in sodium chloride 0.9 % 250 mL IVPB  Status:  Discontinued        1,250 mg 166.7 mL/hr over 90 Minutes Intravenous  Every 12 hours 03/27/12 0541 03/28/12 1348   03/25/12 0600   vancomycin (VANCOCIN) 750 mg in sodium chloride 0.9 % 150 mL IVPB  Status:  Discontinued        750 mg 150 mL/hr over 60 Minutes Intravenous Every 12 hours 03/24/12 1423 03/27/12 0541   03/24/12 1600   vancomycin (VANCOCIN) 1,500 mg in sodium chloride 0.9 % 500 mL IVPB        1,500 mg 250 mL/hr over 120 Minutes Intravenous  Once 03/24/12 1423 03/24/12 1856   03/24/12 1530   cefTAZidime (FORTAZ) 1 g in dextrose 5 % 50 mL IVPB        1 g 100 mL/hr over 30 Minutes Intravenous Every 8 hours 03/24/12 1423     03/24/12 1400   vancomycin (VANCOCIN) IVPB 1000 mg/200 mL premix  Status:  Discontinued        1,000 mg 200 mL/hr over 60 Minutes Intravenous Every 12 hours 03/24/12 1353 03/24/12 1400   03/03/12 1800   vancomycin  (VANCOCIN) 1,750 mg in sodium chloride 0.9 % 500 mL IVPB  Status:  Discontinued        1,750 mg 250 mL/hr over 120 Minutes Intravenous Every 12 hours 03/03/12 0628 03/03/12 0918   02/29/12 1900   micafungin (MYCAMINE) 100 mg in sodium chloride 0.9 % 100 mL IVPB  Status:  Discontinued        100 mg 100 mL/hr over 1 Hours Intravenous Every 24 hours 02/29/12 1754 03/01/12 0933   02/29/12 1730   vancomycin (VANCOCIN) 1,250 mg in sodium chloride 0.9 % 250 mL IVPB  Status:  Discontinued        1,250 mg 166.7 mL/hr over 90 Minutes Intravenous Every 12 hours 02/29/12 1644 03/03/12 0628   02/29/12 1715   vancomycin (VANCOCIN) IVPB 1000 mg/200 mL premix  Status:  Discontinued        1,000 mg 200 mL/hr over 60 Minutes Intravenous  Once 02/29/12 1637 02/29/12 1647   02/29/12 1645   piperacillin-tazobactam (ZOSYN) IVPB 3.375 g  Status:  Discontinued        3.375 g 100 mL/hr over 30 Minutes Intravenous  Once 02/29/12 1637 02/29/12 1641   02/28/12 1800   piperacillin-tazobactam (ZOSYN) IVPB 3.375 g  Status:  Discontinued        3.375 g 12.5 mL/hr over 240 Minutes Intravenous 4 times per day 02/28/12 1454 02/28/12 1510   02/28/12 1800   piperacillin-tazobactam (ZOSYN) IVPB 3.375 g  Status:  Discontinued        3.375 g 12.5 mL/hr over 240 Minutes Intravenous Every 8 hours 02/28/12 1510 03/14/12 1700   02/25/12 1109   dextrose 5 % with cefOXitin (MEFOXIN) ADS Med     Comments: HOLLIE, CHRISTINA: cabinet override         02/25/12 1109 02/25/12 2314   02/25/12 1100   cefOXitin (MEFOXIN) 1 g in dextrose 5 % 50 mL IVPB  Status:  Discontinued        1 g 100 mL/hr over 30 Minutes Intravenous 60 min pre-op 02/25/12 1100 02/25/12 1542          Assessment: Pneumonia:  Currently on Day #14 of Ceftazidime and Day #10 of Levaquin for Stenotrophomonas and Klebsiella isolated from most recent respiratory cultures.  It appears all of his organisms are susceptible to Levaquin.  Goal of Therapy:    Appropriate antimicrobial therapy  Plan:  Continue Ceftazidime 1gm IV q8h and Levaquin 750mg  IV q24h.  Could consider narrowing to  Levaquin alone and complete 14 days of therapy which would be 6/24.  Juliette Alcide, PharmD, BCPS.  Pager: [phone] 04/07/2012, 9:21 AM

## 2012-04-07 NOTE — Progress Notes (Signed)
Passy-Muir Speaking Valve/Dysphagia - Treatment Patient Details  Name: Michael Ellison MRN: [id number] Date of Birth: 05/10/1946  Today's Date: 04/07/2012 Time: 4098-1191 SLP Time Calculation (min): 20 min  Past Medical History:  Past Medical History  Diagnosis Date  . Hypertension   . Cancer   . Cancer of prostate    Past Surgical History:  Past Surgical History  Procedure Date  . Hernia repair   . Prostate biopsy   . Robotic prostate surgery 2011  . Laparotomy 02/25/2012    Procedure: EXPLORATORY LAPAROTOMY;  Surgeon: [name], MD;  Location: [facility];  Service: General;  Laterality: N/A;  . Bowel resection 02/25/2012    Procedure: SMALL BOWEL RESECTION;  Surgeon: [name], MD;  Location: [facility];  Service: General;;  . Wound debridement 03/02/2012    Procedure: DEBRIDEMENT CLOSURE/ABDOMINAL WOUND;  Surgeon: Cherylynn Ridges, MD;  Location: [facility] OR;  Service: General;  Laterality: N/A;    Assessment / Plan / Recommendation Clinical Impression  Patient able to don PMSV for 35 minutes without significant fluctuations in vital signs. RR ran between 22-37 however when increased, quickly decreased spontanously. Vocal quality intermittently wet, likely secondary to severity of dysphagia with penetration of secretions. One episode of tracheal expectoration of thick secretions noted with PMSV in place, partially occluding airway. SLP removed valve, secretions cleared, and then patient able to don valve again for an additional 10 minutes without incidence. Given severity of secretions, recommend PMSV use when alert but with full supervision at this time.     Plan  Continue with current plan of care    Follow Up Recommendations  Inpatient Rehab    Pertinent Vitals/Pain n/a    SLP Goals SLP Goal #1: Pt will tolerate PMSV during all waking hours with intermittent supervision without distress.  SLP Goal #1 - Progress: Progressing toward goal SLP Goal #2: Pt will demonstrate  independence with valve placement and removal with min verbal and visual cues.  SLP Goal #2 - Progress: Progressing toward goal   PMSV Trial  PMSV was placed for: 35 minutes Able to redirect subglottic air through upper airway: Yes Able to Attain Phonation: Yes Voice Quality: Wet (intermittently) Able to Expectorate Secretions: Yes Level of Secretion Expectoration with PMSV: Oral Breath Support for Phonation: Adequate Intelligibility: Intelligible Respirations During Trial: 30  SpO2 During Trial: 91 % Pulse During Trial: 100  Behavior: Alert;Cooperative   Tracheostomy Tube  Additional Tracheostomy Tube Assessment Fenestrated: No Trach Collar Period: since 0800 this am Secretion Description: moderately thick, copious Frequency of Tracheal Suctioning: 3 x this shift Level of Secretion Expectoration: Tracheal;Oral    Vent Dependency  Vent Dependent: No FiO2 (%): 40 %    Cuff Deflation Trial Tolerated Cuff Deflation: Yes Length of Time for Cuff Deflation Trial: 5 minutes prior to PMSV placement Behavior: Alert;Cooperative    Speech Language Pathology Dysphagia Treatment Patient Details Name: Michael Ellison MRN: [id number] DOB: [birth year] Today's Date: 04/07/2012 Time: 3086-5784 SLP Time Calculation (min): 15 min  Assessment / Plan / Recommendation Clinical Impression  Swallowing treatment focused on facilitation of pharyngeal phase of swallow. Patient able to complete oral care on self and following, self fed ice chips with min verbal cues to use effortful swallow. Intermittent wet vocal quality noted indicative of penetration and potential aspiration requiring moderate verbal cueing for increased awareness and then throat clear/cough to clear. PMSV in place during po trials. Patient fatigued quickly (also had completed PMSV treatment following) impacting overall function. Will  continue to f/u.     Diet Recommendation  Continue with Current Diet: NPO    SLP Plan Continue  with current plan of care      Swallowing Goals  SLP Swallowing Goals Goal #3: Pt will complete pharyngeal strengthening exercises with 10 reps each with min verbal cues.  Swallow Study Goal #3 - Progress: Progressing toward goal  General Temperature Spikes Noted: No Respiratory Status: Trach (trach collar at 40% FiO2) Behavior/Cognition: Alert;Cooperative Oral Cavity - Dentition: Adequate natural dentition Patient Positioning: Upright in bed  Oral Cavity - Oral Hygiene     Dysphagia Treatment Treatment focused on: Facilitation of pharyngeal phase Treatment Methods/Modalities: Effortful swallow Patient observed directly with PO's: Yes Type of PO's observed: Ice chips Feeding: Able to feed self Liquids provided via: Teaspoon Pharyngeal Phase Signs & Symptoms: Wet vocal quality;Multiple swallows;Delayed cough Type of cueing: Verbal Amount of cueing: Minimal   Kaelen Brennan Meryl 04/07/2012, 4:02 PM             Zella Dewan Meryl 04/07/2012, 3:58 PM

## 2012-04-07 NOTE — Progress Notes (Signed)
Name: Michael Ellison MRN: [id number] DOB: July 21, 1946    LOS: 43  PULMONARY / CRITICAL CARE MEDICINE  Pt Profile:   66 y/o male smoker admitted to [facility] 02/24/2012 with n/v and abdominal pain due to SBO due to adhesions.  Had ex lap with SB resection 5/10.  Developed delirium post-op.  Transferred to [facility] 5/14 for respiratory failure requiring intubation.  Developed abdominal fascial dehiscence and taken back to OR 5/16.  Failed extubation, and required tracheostomy 5/22. PMHx ETOH, HTN, Prostate cancer  Interval hx:  5/16: OR for fascial dehiscence repair- closed with wound vac 5/18- failed weaning 5/19- extubated 5/21- re-intubated for hypercarbia 5/22-improved co2 and neurostatus, pos almost 2 liters 5/22 SVT>>resolved spontaneously 5/30 -off vent for 24 hours, planned Panda placement 6/1 - up in chair, improved. 6/3 - desat, increased WOB, vent 6/3 - leak trach, improved with extra long 6/5 - FEES eval: failed, still showing evidence of aspiration.  6/16 - Few liquid stools with streaks of blood,  H/h stable.  (has been having mucoid / bloody streaked stool -surgeons aware)  Subjective: Acute desaturation on PS this AM, requiring full vent support with 100% and high PEEP significant lavage.  Short episode of bradycardia during suctioning and desat.  Vital Signs: Temp:  [98.2 F (36.8 C)-99.1 F (37.3 C)] 98.2 F (36.8 C) (06/21 0811) Pulse Rate:  [86-104] 101  (06/21 0922) Resp:  [17-33] 28  (06/21 0811) BP: (102-127)/(65-78) 117/75 mmHg (06/21 0922) SpO2:  [93 %-100 %] 95 % (06/21 0930) FiO2 (%):  [40 %] 40 % (06/21 0930) Weight:  [72.2 kg (159 lb 2.8 oz)] 72.2 kg (159 lb 2.8 oz) (06/21 0037)  Physical Examination: General - Alert and interactive. HEENT - trach site clean, #6.0 xlt Cardiac - regular, no m/r/g Chest -a few scattered crackles, no wheezing, good airflow. Abd - round/soft, bs+ Ext - no edema Neuro - awake and interacting, moves all ext  Intake/Output  Summary (Last 24 hours) at 04/07/12 1156 Last data filed at 04/07/12 1000  Gross per 24 hour  Intake   2853 ml  Output   2310 ml  Net    543 ml   CBC  Lab 04/07/12 0405 04/06/12 0415 04/05/12 0417  HGB 11.4* 11.5* 11.7*  HCT 35.1* 34.7* 36.7*  WBC 10.2 9.7 11.0*  PLT 235 226 239   BMET  Lab 04/07/12 0405 04/06/12 0415 04/05/12 0417 04/04/12 0428 04/03/12 0400  NA 135 135 138 139 134*  K 3.3* 3.2* -- -- --  CL 98 99 103 104 100  CO2 30 26 28 25 28   GLUCOSE 106* 75 72 89 105*  BUN 14 15 13 9 9   CREATININE 0.82 0.68 0.77 0.56 0.53  CALCIUM 8.4 8.6 8.8 8.7 8.5  MG 1.7 1.9 1.6 1.8 --  PHOS 2.4 3.2 3.0 -- --   Lab Results  Component Value Date   ALT 10 03/29/2012   AST 13 03/29/2012   ALKPHOS 49 03/29/2012   BILITOT 0.3 03/29/2012    Intake/Output Summary (Last 24 hours) at 04/07/12 1156 Last data filed at 04/07/12 1000  Gross per 24 hour  Intake   2853 ml  Output   2310 ml  Net    543 ml   Medications reviewed  ASSESSMENT AND PLAN  Acute hypoxic/hypercapnic respiratory failure 2nd to delirium, aspiration pneumonia, 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  Failed extubation, and s/p trach 5/22. Xtra long 6 cuffed 6/3>>>   Has not been  able to come off vent support. Think deconditioning and Pneumonia contributing to this. He has both Klebsiella and Stenotrophomonas. Making some progress after apparent mucous plugging.   6/19 Acute desat, brady, requiring 100% and high PEEP to address.   Plan: -Cont scheduled BDs, will continue ICS -TC as tolerted. -Cont vibravest. - Two doses of lasix and K today.  SBO s/p lap with SB resection 5/10 complicated by post-op ileus and fascial dehiscence, and return to OR 5/16. Chronic pain. Dysphagia  Plan: - Post-op care per CCS, appreciate wound care - Discussed w Dr Janee Morn >> open PEG would be very complicated; IR placed PEG, appreciate input. - Repeat swallow eval pending.  Hopeful to allow to eat but will  keep PEG due to prolonged illness for now.     Mild diarrhea with blood streaks: Small quantity, cdiff negative, CBC stable.  If worsens, will need to change lovenox to SCD.  He is at very high risk for TED, so would like to leave on lovenox if possible.    Plan: - No change in abx, continue levofloxacin for stenotrophomonas.   - Levofloxacin 6/15>>>  Hypokalemia -noted 6/17  Plan: -monitor, replete PRN  Hypertension-on lisinopril  / norvasc at home.  Has been on Q8 IV metoprolol.  Now tolerating TF's.  Plan: -Changed to lopressor PO 6/17 (12.5 bid), well tolerated.  Now that PEG is in place, will begin looking for vent SNF as with these frequent episodes of desaturation, mucous plugging and bradycardia doubt will be able to get of the vent long term while abdomen is still an active issue.  Alyson Reedy, M.D. [facility] Pulmonary/Critical Care Medicine. Pager: [phone]. After hours pager: [phone].

## 2012-04-07 NOTE — Progress Notes (Signed)
36 Days Post-Op  Subjective: No abdominal pain and tolerating tube feedings OK. Still vent dependent and may go to LTF  Objective: Vital signs in last 24 hours: Temp:  [98.2 F (36.8 C)-99.1 F (37.3 C)] 98.7 F (37.1 C) (06/21 0413) Pulse Rate:  [86-104] 99  (06/21 0413) Resp:  [17-33] 17  (06/21 0413) BP: (102-127)/(65-88) 127/78 mmHg (06/21 0413) SpO2:  [88 %-99 %] 99 % (06/21 0413) FiO2 (%):  [40 %-50 %] 40 % (06/21 0323) Weight:  [159 lb 2.8 oz (72.2 kg)] 159 lb 2.8 oz (72.2 kg) (06/21 0037)   Intake/Output from previous day: 06/20 0701 - 06/21 0700 In: 2803 [I.V.:125; NG/GT:1200; IV Piggyback:895; TPN:63] Out: 2410 [Urine:2410] Intake/Output this shift:     General appearance: alert, cooperative and no distress GI: soft, non-tender; bowel sounds normal; no masses,  no organomegaly  Incision: healing well, Healthy granulations - may need some AgNO3 in a day or two  Lab Results:   Basename 04/07/12 0405 04/06/12 0415  WBC 10.2 9.7  HGB 11.4* 11.5*  HCT 35.1* 34.7*  PLT 235 226   BMET  Basename 04/07/12 0405 04/06/12 0415  NA 135 135  K 3.3* 3.2*  CL 98 99  CO2 30 26  GLUCOSE 106* 75  BUN 14 15  CREATININE 0.82 0.68  CALCIUM 8.4 8.6   PT/INR No results found for this basename: LABPROT:2,INR:2 in the last 72 hours ABG No results found for this basename: PHART:2,PCO2:2,PO2:2,HCO3:2 in the last 72 hours  MEDS, Scheduled    . albuterol  2.5 mg Nebulization QID  . antiseptic oral rinse  15 mL Mouth Rinse QID  . budesonide  0.5 mg Nebulization BID  . cefTAZidime (FORTAZ)  IV  1 g Intravenous Q8H  . chlorhexidine  15 mL Mouth Rinse BID  . enoxaparin  40 mg Subcutaneous Q24H  . escitalopram  10 mg Oral Daily  . feeding supplement  30 mL Per Tube BID  . fentaNYL  100 mcg Intravenous Once  . free water  200 mL Per Tube Q4H  . furosemide  40 mg Intravenous Q8H  . levofloxacin (LEVAQUIN) IV  750 mg Intravenous Q24H  . magnesium sulfate 1 - 4 g bolus IVPB   2 g Intravenous Once  . metoprolol tartrate  12.5 mg Per Tube BID  . midazolam  2 mg Intravenous Once  . pantoprazole sodium  40 mg Per Tube QHS  . potassium chloride  40 mEq Per Tube Once  . potassium chloride  40 mEq Per Tube Once  . potassium phosphate IVPB (mmol)  30 mmol Intravenous Once  . sodium chloride  3 mL Intravenous Q12H  . DISCONTD: feeding supplement  30 mL Per Tube TID WC    Studies/Results: Dg Chest Port 1 View  04/05/2012  *RADIOLOGY REPORT*  Clinical Data: Shortness of breath, acute desaturation  PORTABLE CHEST - 1 VIEW  Comparison: 03/29/2012  Findings: Tracheostomy tube is unchanged in position. Cardiac size is stable.  Again noted elevation of the right hemidiaphragm. Bullous changes bilateral lung apices are stable. Stable streaky atelectasis or scarring right base.  There is small left pleural effusion with left basilar atelectasis or infiltrate. No pulmonary edema.  IMPRESSION:  1.  No pulmonary edema.  Stable tracheostomy tube position.  There is  small left pleural effusion with left basilar atelectasis or infiltrate.  Stable streaky atelectasis or scarring right base.  Original Report Authenticated By: Natasha Mead, M.D.    Assessment: s/p Procedure(s): DEBRIDEMENT CLOSURE/ABDOMINAL WOUND  Stable and progressing from surgical standpoint  Plan: Continue wound care as we are doing, No other changes   LOS: 43 days     Currie Paris, MD, [facility], [place] [phone]   04/07/2012 7:33 AM

## 2012-04-07 NOTE — Progress Notes (Signed)
Michael Ellison,PT Acute Rehabilitation [phone] (pager)  

## 2012-04-07 NOTE — Progress Notes (Signed)
Condom cath applied per patient's request due to frequent urination and rectal pouch applied due to frequent stools -mucousy with blood streaks.  Abdominal wound is clean. Copious amount of white secretions.

## 2012-04-08 LAB — BASIC METABOLIC PANEL
BUN: 12 mg/dL (ref 6–23)
CO2: 29 mEq/L (ref 19–32)
Calcium: 8.4 mg/dL (ref 8.4–10.5)
Creatinine, Ser: 0.56 mg/dL (ref 0.50–1.35)
Glucose, Bld: 94 mg/dL (ref 70–99)
Sodium: 135 mEq/L (ref 135–145)

## 2012-04-08 LAB — PHOSPHORUS: Phosphorus: 2.1 mg/dL — ABNORMAL LOW (ref 2.3–4.6)

## 2012-04-08 LAB — CBC
HCT: 34.8 % — ABNORMAL LOW (ref 39.0–52.0)
Hemoglobin: 11.5 g/dL — ABNORMAL LOW (ref 13.0–17.0)
MCH: 27.3 pg (ref 26.0–34.0)
MCV: 82.7 fL (ref 78.0–100.0)
RBC: 4.21 MIL/uL — ABNORMAL LOW (ref 4.22–5.81)

## 2012-04-08 NOTE — Progress Notes (Signed)
Name: Michael Ellison MRN: [id number] DOB: 04/12/1946    LOS: 44  PULMONARY / CRITICAL CARE MEDICINE  Pt Profile:   66 y/o male smoker admitted to [facility] 02/24/2012 with n/v and abdominal pain due to SBO due to adhesions.  Had ex lap with SB resection 5/10.  Developed delirium post-op.  Transferred to [facility] 5/14 for respiratory failure requiring intubation.  Developed abdominal fascial dehiscence and taken back to OR 5/16.  Failed extubation, and required tracheostomy 5/22. PMHx ETOH, HTN, Prostate cancer  Interval hx:  5/16: OR for fascial dehiscence repair- closed with wound vac 5/18- failed weaning 5/19- extubated 5/21- re-intubated for hypercarbia 5/22-improved co2 and neurostatus, pos almost 2 liters 5/22 SVT>>resolved spontaneously 5/30 -off vent for 24 hours, planned Panda placement 6/1 - up in chair, improved. 6/3 - desat, increased WOB, vent 6/3 - leak trach, improved with extra long 6/5 - FEES eval: failed, still showing evidence of aspiration.  6/16 - Few liquid stools with streaks of blood,  H/h stable.  (has been having mucoid / bloody streaked stool -surgeons aware) 6/21 started 24 h T collar  Subjective: Comfortable on T collar overnight  Vital Signs: Temp:  [97.8 F (36.6 C)-98.8 F (37.1 C)] 98.4 F (36.9 C) (06/22 0815) Pulse Rate:  [90-104] 100  (06/22 1029) Resp:  [24-32] 24  (06/22 0815) BP: (108-126)/(64-77) 113/71 mmHg (06/22 1029) SpO2:  [91 %-98 %] 97 % (06/22 0815) FiO2 (%):  [40 %] 40 % (06/22 0745) Weight:  [158 lb 4.6 oz (71.8 kg)] 158 lb 4.6 oz (71.8 kg) (06/22 0058)  Physical Examination: General - Alert and interactive. HEENT - trach site clean, #6.0 xlt Cardiac - regular, no m/r/g Chest -a few scattered crackles, no wheezing, good airflow. Abd - round/soft, bs+ Ext - no edema Neuro - awake and interacting, moves all ext  Intake/Output Summary (Last 24 hours) at 04/08/12 1118 Last data filed at 04/08/12 0900  Gross per 24 hour  Intake    2055 ml  Output   2375 ml  Net   -320 ml   CBC  Lab 04/08/12 0942 04/07/12 0405 04/06/12 0415  HGB 11.5* 11.4* 11.5*  HCT 34.8* 35.1* 34.7*  WBC 9.6 10.2 9.7  PLT 225 235 226   BMET  Lab 04/08/12 0942 04/07/12 0405 04/06/12 0415 04/05/12 0417 04/04/12 0428  NA 135 135 135 138 139  K 3.7 3.3* -- -- --  CL 98 98 99 103 104  CO2 29 30 26 28 25   GLUCOSE 94 106* 75 72 89  BUN 12 14 15 13 9   CREATININE 0.56 0.82 0.68 0.77 0.56  CALCIUM 8.4 8.4 8.6 8.8 8.7  MG 1.9 1.7 1.9 1.6 1.8  PHOS 2.1* 2.4 3.2 3.0 --   Lab Results  Component Value Date   ALT 10 03/29/2012   AST 13 03/29/2012   ALKPHOS 49 03/29/2012   BILITOT 0.3 03/29/2012    Intake/Output Summary (Last 24 hours) at 04/08/12 1118 Last data filed at 04/08/12 0900  Gross per 24 hour  Intake   2055 ml  Output   2375 ml  Net   -320 ml   Medications reviewed  ASSESSMENT AND PLAN  Acute hypoxic/hypercapnic respiratory failure 2nd to delirium, aspiration pneumonia, 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  Failed extubation, and s/p trach 5/22. Xtra long 6 cuffed 6/3>>>   Making some progress after apparent mucous plugging.    Plan: -Cont scheduled BDs, will continue ICS -TC as tolerated  24 h per day effective 6/21 -Cont vibravest.    SBO s/p lap with SB resection 5/10 complicated by post-op ileus and fascial dehiscence, and return to OR 5/16. Chronic pain. Dysphagia  Plan: - Post-op care per CCS, appreciate wound care - Discussed w Dr Janee Morn >> open PEG would be very complicated; IR placed PEG, appreciate input. - Repeat swallow eval pending.  Hopeful to allow to eat but will keep PEG due to prolonged illness for now.     Mild diarrhea with blood streaks: Small quantity, cdiff negative, CBC stable.  If worsens, will need to change lovenox to SCD.  He is at very high risk for TED, so would like to leave on lovenox if possible.     INFECTIOUS DZ Cultures:  BCX2 5/14 >>1/2 gram neg rod>>> 1/2  Klebsiella  UC 5/14 >>Enterococcus (sensitive to ampicillin)  Sputum 5/14>> Klebsiella pneumoniae (pan sensitive)  Wound 5/14>>Klebsiella Pneumoniae (pan sensitive) & Enterobacter Cloacae (resistant to ancef, cefoxitin)  BC X2 5/20>>>ng  Sputum 5/22>>>klebs, enterobacter  Sputum 6/7>KLEBSIELLA PNEUMONIAE and STENOTROPHOMONAS MALTOPHILIA.   Antibiotics:  vanc 5/14 >> 5/17  Zosyn 5/13 >>5/28  Vanc 6/9>>>6/11 Ceftaz 6/9 (klebsiella)> 6/22 levaquin (stenotrophomonas) 6/11>>>   Wound infection, aspiration pneumonia, UTI.  Plan:  -levaquin, cont ceftaz   Plan: - No change in abx, continue levofloxacin for stenotrophomonas.   - Levofloxacin 6/15>>>  Hypokalemia -noted 6/17  Plan: -monitor, replete PRN  Hypertension-on lisinopril  / norvasc at home.  Has been on Q8 IV metoprolol.  Now tolerating TF's.  Plan: -Changed to lopressor PO 6/17 (12.5 bid), well tolerated.  Now that PEG is in place,  looking for vent SNF as with these frequent episodes of desaturation, mucous plugging and bradycardia doubt will be able to get of the vent long term while abdomen is still an active issue.  Sandrea Hughs, MD Pulmonary and Critical Care Medicine [facility] Cell [phone]

## 2012-04-08 NOTE — Progress Notes (Signed)
At 2001, pt had a 7 beat run of vtach.  Pt sitting upright in bed, denies CP but says he did feel a little sob but denies all other symptoms.  Pt requested tracheal suction and this was done with good results. MD notified of episode of Vtach and orders were received. Will continue to monitor.

## 2012-04-09 ENCOUNTER — Inpatient Hospital Stay (HOSPITAL_COMMUNITY): Payer: Medicare Other

## 2012-04-09 MED ORDER — ACETYLCYSTEINE 20 % IN SOLN
4.0000 mL | Freq: Four times a day (QID) | RESPIRATORY_TRACT | Status: AC
  Administered 2012-04-09 – 2012-04-11 (×7): 4 mL via RESPIRATORY_TRACT
  Filled 2012-04-09 (×8): qty 4

## 2012-04-09 MED ORDER — ACETYLCYSTEINE 10% NICU INHALATION SOLUTION
2.0000 mL | Freq: Four times a day (QID) | RESPIRATORY_TRACT | Status: DC
Start: 2012-04-09 — End: 2012-04-09

## 2012-04-09 NOTE — Progress Notes (Signed)
During assessment it was noted that while there were positive breath sounds and chest rise and fall on the right side, there was none on the the left side. VSS and pt says that he feels ok, his color is good and his sats are 95% on trach collar.  Asked RT to assess and their findings were the same. MD notified of above and he advised to have RT do lavage suction, which has now been done.  Pt says that he does feel better.  Chest rise and fall is now symmetrical. Breath sounds remain very diminished on the left and coarse crackles can be heard on expiration, the right side remains clear. Pt is now resting comfortably.  Will continue to monitor.

## 2012-04-09 NOTE — Progress Notes (Signed)
Weekend Coverage:  Was contacted by RN with regards to entire family being present with POA. CSW completed POA form as well as notarize.  Family has been expecting a HCPOA packet in which CSW does not have copy of today, however will ask department secretary to supply and will give to family to complete and discuss on 6/24.  Once received and completed, will come back and notarize.  Unit CSW was left note and follow up.  Ashley Jacobs, MSW LCSW [phone]

## 2012-04-09 NOTE — Progress Notes (Signed)
Name: Michael Ellison MRN: [id number] DOB: 02-Jul-1946    LOS: 45  PULMONARY / CRITICAL CARE MEDICINE  Pt Profile:   66 y/o male smoker admitted to [facility] 02/24/2012 with n/v and abdominal pain due to SBO due to adhesions.  Had ex lap with SB resection 5/10.  Developed delirium post-op.  Transferred to [facility] 5/14 for respiratory failure requiring intubation.  Developed abdominal fascial dehiscence and taken back to OR 5/16.  Failed extubation, and required tracheostomy 5/22. PMHx ETOH, HTN, Prostate cancer  Interval hx:  5/16: OR for fascial dehiscence repair- closed with wound vac 5/18- failed weaning 5/19- extubated 5/21- re-intubated for hypercarbia 5/22-improved co2 and neurostatus, pos almost 2 liters 5/22 SVT>>resolved spontaneously 5/30 -off vent for 24 hours, planned Panda placement 6/1 - up in chair, improved. 6/3 - desat, increased WOB, vent 6/3 - leak trach, improved with extra long 6/5 - FEES eval: failed, still showing evidence of aspiration.  6/16 - Few liquid stools with streaks of blood,  H/h stable.  (has been having mucoid / bloody streaked stool -surgeons aware) 6/18 Perc gastrostomy per IR 6/21 started 24 h T collar  Subjective: Comfortable on T collar overnight  Vital Signs: Temp:  [97.2 F (36.2 C)-99.8 F (37.7 C)] 98.1 F (36.7 C) (06/23 1313) Pulse Rate:  [89-109] 91  (06/23 1313) Resp:  [26-33] 28  (06/23 1313) BP: (105-119)/(71-79) 111/77 mmHg (06/23 1313) SpO2:  [90 %-98 %] 93 % (06/23 1313) FiO2 (%):  [40 %] 40 % (06/23 1143) Weight:  [152 lb 1.9 oz (69 kg)] 152 lb 1.9 oz (69 kg) (06/23 0428)  Physical Examination: General - Alert and interactive. HEENT - trach site clean, #6.0 xlt Cardiac - regular, no m/r/g Chest -a few scattered crackles, no wheezing, good airflow x decreased L Base. Abd - round/soft, bs+ Ext - no edema Neuro - awake and interacting, moves all ext  Intake/Output Summary (Last 24 hours) at 04/09/12 1329 Last data filed at  04/09/12 1200  Gross per 24 hour  Intake   1928 ml  Output    825 ml  Net   1103 ml   CBC  Lab 04/08/12 0942 04/07/12 0405 04/06/12 0415  HGB 11.5* 11.4* 11.5*  HCT 34.8* 35.1* 34.7*  WBC 9.6 10.2 9.7  PLT 225 235 226   BMET  Lab 04/08/12 0942 04/07/12 0405 04/06/12 0415 04/05/12 0417 04/04/12 0428  NA 135 135 135 138 139  K 3.7 3.3* -- -- --  CL 98 98 99 103 104  CO2 29 30 26 28 25   GLUCOSE 94 106* 75 72 89  BUN 12 14 15 13 9   CREATININE 0.56 0.82 0.68 0.77 0.56  CALCIUM 8.4 8.4 8.6 8.8 8.7  MG 1.9 1.7 1.9 1.6 1.8  PHOS 2.1* 2.4 3.2 3.0 --   Lab Results  Component Value Date   ALT 10 03/29/2012   AST 13 03/29/2012   ALKPHOS 49 03/29/2012   BILITOT 0.3 03/29/2012    Intake/Output Summary (Last 24 hours) at 04/09/12 1329 Last data filed at 04/09/12 1200  Gross per 24 hour  Intake   1928 ml  Output    825 ml  Net   1103 ml   pcxr 6/23 Bibasilar airspace opacities stable on the left and worse on the  right. Left pleural effusions stable   ASSESSMENT AND PLAN  Acute hypoxic/hypercapnic respiratory failure 2nd to delirium, aspiration pneumonia, 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  Failed extubation, and s/p  trach 5/22. Xtra long 6 cuffed 6/3>>>   Making some progress after apparent mucous plugging.    Plan: -Cont scheduled BDs, will continue ICS -TC as tolerated 24 h per day effective 6/21 -Cont vibravest.    SBO s/p lap with SB resection 5/10 complicated by post-op ileus and fascial dehiscence, and return to OR 5/16. Chronic pain. Dysphagia  Plan: - Post-op care per CCS, appreciate wound care - Repeat swallow eval pending.  Hopeful to allow to eat but will keep PEG due to prolonged illness for now.     Mild diarrhea with blood streaks: Small quantity, cdiff negative, CBC stable.  If worsens, will need to change lovenox to SCD.  He is at very high risk for TED, so would like to leave on lovenox if possible.     INFECTIOUS DZ Cultures:   BCX2 5/14 >>1/2 gram neg rod>>> 1/2 Klebsiella  UC 5/14 >>Enterococcus (sensitive to ampicillin)  Sputum 5/14>> Klebsiella pneumoniae (pan sensitive)  Wound 5/14>>Klebsiella Pneumoniae (pan sensitive) & Enterobacter Cloacae (resistant to ancef, cefoxitin)  BC X2 5/20>>>ng  Sputum 5/22>>>klebs, enterobacter  Sputum 6/7>KLEBSIELLA PNEUMONIAE and STENOTROPHOMONAS MALTOPHILIA.   Antibiotics:  vanc 5/14 >> 5/17  Zosyn 5/13 >>5/28  Vanc 6/9>>>6/11 Ceftaz 6/9 (klebsiella  Sputum and wound)> 6/21 levaquin (stenotrophomonas) 6/11>>>   Wound infection, aspiration pneumonia, UTI.  Plan:  -levaquin 14 days will be complete 6/25    Hypokalemia -noted 6/17 Plan: -monitor, replete PRN  Hypertension-on lisinopril  / norvasc at home.  Had been on Q8 IV metoprolol.  Now tolerating TF's.  Plan: -Changed to lopressor PO 6/17 (12.5 bid), well tolerated.  Now that PEG is in place,  looking for vent SNF      Sandrea Hughs, MD Pulmonary and Critical Care Medicine [facility] Cell [phone]

## 2012-04-09 NOTE — Progress Notes (Signed)
Notified Dr. Park Breed regarding pt respiratory assessment.  Pt appears comfortable and in no distress, but noticed no chest rise on L side and no breath sounds were heard in upper and lower lobes.  Right side is rhoncus in upper and lower lobes.  Was told that night shift had to lavage pt twice regarding this situation.  Orders received (portable CXR). Will continue to monitor and assess.  Salomon Mast, RN

## 2012-04-10 MED ORDER — WHITE PETROLATUM GEL
Status: AC
  Filled 2012-04-10: qty 5

## 2012-04-10 MED ORDER — GLUCOSE 4 G PO CHEW
1.0000 | CHEWABLE_TABLET | Freq: Once | ORAL | Status: DC
  Filled 2012-04-10: qty 1

## 2012-04-10 NOTE — Progress Notes (Signed)
Follow wound nurse recommendations - dressing changes with hydrogel.  Michael Ellison. Corliss Skains, MD, [facility]  04/10/2012 8:59 AM

## 2012-04-10 NOTE — Progress Notes (Signed)
39 Days Post-Op  Subjective: Patient denies abdominal pain, denies nausea.  Still vent dependent and may go to LTF  Objective: Vital signs in last 24 hours: Temp:  [97.7 F (36.5 C)-98.4 F (36.9 C)] 97.7 F (36.5 C) (06/24 0736) Pulse Rate:  [85-100] 96  (06/24 0736) Resp:  [23-28] 23  (06/24 0736) BP: (101-112)/(67-79) 112/73 mmHg (06/24 0736) SpO2:  [93 %-98 %] 96 % (06/24 0736) FiO2 (%):  [40 %] 40 % (06/24 0736) Weight:  [154 lb 5.2 oz (70 kg)] 154 lb 5.2 oz (70 kg) (06/24 0513)   Intake/Output from previous day: 06/23 0701 - 06/24 0700 In: 1888 [I.V.:173; NG/GT:280; IV Piggyback:150] Out: 1000 [Urine:675; Stool:325]   General appearance: alert, cooperative and no distress GI: soft, non-tender; bowel sounds normal; no masses,  no organomegaly Incision: healing well, Healthy granulations, no purulent drainage.   Lab Results:   [facility] 04/08/12 0942  WBC 9.6  HGB 11.5*  HCT 34.8*  PLT 225   BMET  Basename 04/08/12 0942  NA 135  K 3.7  CL 98  CO2 29  GLUCOSE 94  BUN 12  CREATININE 0.56  CALCIUM 8.4   MEDS, Scheduled    . acetylcysteine  4 mL Nebulization Q6H  . albuterol  2.5 mg Nebulization QID  . antiseptic oral rinse  15 mL Mouth Rinse QID  . budesonide  0.5 mg Nebulization BID  . chlorhexidine  15 mL Mouth Rinse BID  . enoxaparin  40 mg Subcutaneous Q24H  . escitalopram  10 mg Oral Daily  . feeding supplement  30 mL Per Tube BID  . fentaNYL  100 mcg Intravenous Once  . free water  200 mL Per Tube Q4H  . levofloxacin (LEVAQUIN) IV  750 mg Intravenous Q24H  . metoprolol tartrate  12.5 mg Per Tube BID  . midazolam  2 mg Intravenous Once  . pantoprazole sodium  40 mg Per Tube QHS  . sodium chloride  3 mL Intravenous Q12H  . white petrolatum      . DISCONTD: acetylcysteine  2 mL Nebulization QID    Studies/Results: Dg Chest Port 1 View  04/09/2012  *RADIOLOGY REPORT*  Clinical Data: Congestion  PORTABLE CHEST - 1 VIEW  Comparison: 04/05/2012   Findings: Endotracheal tube is stable.  Bilateral central basilar airspace opacities are stable at the left base and increased at the right base.  Cardiomegaly is stable.  Left pleural effusion is stable.  IMPRESSION: Bibasilar airspace opacities stable on the left and worse on the right.  Left pleural effusions stable  Original Report Authenticated By: Donavan Burnet, M.D.    Assessment: s/p Procedure(s): DEBRIDEMENT CLOSURE/ABDOMINAL WOUND Stable and progressing from surgical standpoint  Plan: Continue daily wound dressing changes with hydrogel.   LOS: 46 days   Michael Ellison 04/10/2012 8:40 AM

## 2012-04-10 NOTE — Progress Notes (Signed)
Clinical Child psychotherapist spoke with Pension scheme manager at [place].  Director of Nursing currently reviewing pt's referral.  Tammy to call this CSW back after meeting with DON.  CSW to continue to follow and assist as needed.  Angelia Mould, MSW, [place] [phone]

## 2012-04-10 NOTE — Progress Notes (Signed)
Name: Michael Ellison MRN: [id number] DOB: January 21, 1946    LOS: 46 Date of admit 02/24/2012  8:34 AM  PULMONARY / CRITICAL CARE MEDICINE  Pt Profile:   66 y/o male smoker admitted to [facility] 02/24/2012 with n/v and abdominal pain due to SBO due to adhesions.  Had ex lap with SB resection 5/10.  Developed delirium post-op.  Transferred to [facility] 5/14 for respiratory failure requiring intubation.  Developed abdominal fascial dehiscence and taken back to OR 5/16.  Failed extubation, and required tracheostomy 5/22. PMHx ETOH, HTN, Prostate cancer  Interval hx:  5/16: OR for fascial dehiscence repair- closed with wound vac 5/18- failed weaning 5/19- extubated 5/21- re-intubated for hypercarbia 5/22-improved co2 and neurostatus, pos almost 2 liters 5/22 SVT>>resolved spontaneously 5/30 -off vent for 24 hours, planned Panda placement 6/1 - up in chair, improved. 6/3 - desat, increased WOB, vent 6/3 - leak trach, improved with extra long 6/5 - FEES eval: failed, still showing evidence of aspiration.  6/16 - Few liquid stools with streaks of blood,  H/h stable.  (has been having mucoid / bloody streaked stool -surgeons aware) 6/18 Perc gastrostomy per IR 6/21 started 24 h T collar 6/24 - still on TC  Subjective: Comfortable on T collar overnight; no changes overnight . But on 40% trach collar  Vital Signs: Temp:  [97.7 F (36.5 C)-98.4 F (36.9 C)] 98 F (36.7 C) (06/24 1213) Pulse Rate:  [85-99] 97  (06/24 1600) Resp:  [23-30] 30  (06/24 1600) BP: (101-112)/(72-76) 107/73 mmHg (06/24 1600) SpO2:  [91 %-98 %] 91 % (06/24 1600) FiO2 (%):  [40 %] 40 % (06/24 1600) Weight:  [70 kg (154 lb 5.2 oz)] 70 kg (154 lb 5.2 oz) (06/24 0513)  Physical Examination: General - Alert and interactive. HEENT - trach site clean, #6.0 xlt Cardiac - regular, no m/r/g Chest -a few scattered crackles, no wheezing, good airflow x decreased L Base. Abd - round/soft, bs+ Ext - no edema Neuro - awake and  interacting, moves all ext  Intake/Output Summary (Last 24 hours) at 04/10/12 1618 Last data filed at 04/10/12 0900  Gross per 24 hour  Intake   1452 ml  Output    675 ml  Net    777 ml   CBC  Lab 04/08/12 0942 04/07/12 0405 04/06/12 0415  HGB 11.5* 11.4* 11.5*  HCT 34.8* 35.1* 34.7*  WBC 9.6 10.2 9.7  PLT 225 235 226   BMET  Lab 04/08/12 0942 04/07/12 0405 04/06/12 0415 04/05/12 0417 04/04/12 0428  NA 135 135 135 138 139  K 3.7 3.3* -- -- --  CL 98 98 99 103 104  CO2 29 30 26 28 25   GLUCOSE 94 106* 75 72 89  BUN 12 14 15 13 9   CREATININE 0.56 0.82 0.68 0.77 0.56  CALCIUM 8.4 8.4 8.6 8.8 8.7  MG 1.9 1.7 1.9 1.6 1.8  PHOS 2.1* 2.4 3.2 3.0 --   Lab Results  Component Value Date   ALT 10 03/29/2012   AST 13 03/29/2012   ALKPHOS 49 03/29/2012   BILITOT 0.3 03/29/2012    Intake/Output Summary (Last 24 hours) at 04/10/12 1618 Last data filed at 04/10/12 0900  Gross per 24 hour  Intake   1452 ml  Output    675 ml  Net    777 ml   pcxr 6/23 Bibasilar airspace opacities stable on the left and worse on the  right. Left pleural effusions stable  Dg Chest [facility]  04/09/2012  *RADIOLOGY REPORT*  Clinical Data: Congestion  PORTABLE CHEST - 1 VIEW  Comparison: 04/05/2012  Findings: Endotracheal tube is stable.  Bilateral central basilar airspace opacities are stable at the left base and increased at the right base.  Cardiomegaly is stable.  Left pleural effusion is stable.  IMPRESSION: Bibasilar airspace opacities stable on the left and worse on the right.  Left pleural effusions stable  Original Report Authenticated By: Donavan Burnet, M.D.      ASSESSMENT AND PLAN  Acute hypoxic/hypercapnic respiratory failure 2nd to delirium, aspiration pneumonia, 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  Failed extubation, and s/p trach 5/22. Xtra long 6 cuffed 6/3>>>   On 6/24: on trach collar since 6/21 but on 40% fio2.  Plan: -Cont scheduled BDs, will continue  ICS -TC as tolerated 24 h per day effective 6/21 -Cont vibravest.    SBO s/p lap with SB resection 5/10 complicated by post-op ileus and fascial dehiscence, and return to OR 5/16. Chronic pain. Dysphagia   - on 6./24: tube feeds via peg. Per nutrition meeting 90% of goal Plan: - Post-op care per CCS, appreciate wound care - Repeat swallow eval pending.  Hopeful to allow to eat but will keep PEG due to prolonged illness for now.     Mild diarrhea with blood streaks: Small quantity, cdiff negative, CBC stable.  If worsens, will need to change lovenox to SCD.  He is at very high risk for TED, so would like to leave on lovenox if possible. - on 6/24: still frequent stools and is s/p rectal pouch  6/21   INFECTIOUS DZ Cultures:  BCX2 5/14 >>1/2 gram neg rod>>> 1/2 Klebsiella  UC 5/14 >>Enterococcus (sensitive to ampicillin)  Sputum 5/14>> Klebsiella pneumoniae (pan sensitive)  Wound 5/14>>Klebsiella Pneumoniae (pan sensitive) & Enterobacter Cloacae (resistant to ancef, cefoxitin)  BC X2 5/20>>>ng  Sputum 5/22>>>klebs, enterobacter  Sputum 6/7>KLEBSIELLA PNEUMONIAE and STENOTROPHOMONAS MALTOPHILIA.   Antibiotics:  vanc 5/14 >> 5/17  Zosyn 5/13 >>5/28  Vanc 6/9>>>6/11 Ceftaz 6/9 (klebsiella  Sputum and wound)> 6/21 levaquin (stenotrophomonas) 6/11>>>   Wound infection, aspiration pneumonia, UTI.  Plan:  -levaquin 14 days will be complete 6/25    Hypertension-on lisinopril  / norvasc at home.  Had been on Q8 IV metoprolol.  Now tolerating TF's.  Plan: -Changed to lopressor PO 6/17 (12.5 bid), well tolerated.    DISPO  - snf v vent snf pending     Dr. Kalman Shan, M.D., [facility].C.P Pulmonary and Critical Care Medicine Staff Physician [facility] [place] Pulmonary and Critical Care Pager: [phone], If no answer or between  15:00h - 7:00h: call [phone]  04/10/2012 4:23 PM

## 2012-04-10 NOTE — Progress Notes (Signed)
Physical Therapy Treatment Patient Details Name: Michael Ellison MRN: [id number] DOB: 1946/09/05 Today's Date: 04/10/2012 Time: 4098-1191 PT Time Calculation (min): 25 min  PT Assessment / Plan / Recommendation Comments on Treatment Session  Pt s/p SBresection, VDRF, trach and PEG. Pt progressing well with ambulation today able to double his distance. However maintaining 85-90% on 50% O2 via trach collar. Pt 94% at rest on 40% trach collar but remaining in high 80s with ambulation. Pt does not report any SOB or difficulty with ambulation with or without PMSV on . Pt encouraged to ask to ambulate with nursing daily to maximize mobility.     Follow Up Recommendations       Barriers to Discharge        Equipment Recommendations       Recommendations for Other Services    Frequency     Plan Discharge plan remains appropriate;Frequency remains appropriate    Precautions / Restrictions Precautions Precautions: Fall Precaution Comments: trach, peg   Pertinent Vitals/Pain No pain    Mobility  Bed Mobility Bed Mobility: Supine to Sit Supine to Sit: 6: Modified independent (Device/Increase time);HOB flat;With rails Sitting - Scoot to Edge of Bed: 6: Modified independent (Device/Increase time) Transfers Transfers: Sit to Stand;Stand to Sit Sit to Stand: 5: Supervision;From bed;From chair/3-in-1 Stand to Sit: 5: Supervision;To chair/3-in-1;With armrests Ambulation/Gait Ambulation/Gait Assistance: 4: Min guard Ambulation Distance (Feet): 400 Feet Assistive device: Rolling walker Ambulation/Gait Assistance Details: cueing for posture and to step into RW Gait Pattern: Step-through pattern;Decreased stride length Gait velocity: decreased Stairs: No    Exercises General Exercises - Lower Extremity Long Arc Quad: AROM;Both;20 reps;Seated Hip Flexion/Marching: AROM;Both;20 reps;Seated   PT Diagnosis:    PT Problem List:   PT Treatment Interventions:     PT Goals Acute Rehab PT  Goals PT Goal: Supine/Side to Sit - Progress: Met PT Goal: Sit to Stand - Progress: Progressing toward goal PT Goal: Stand to Sit - Progress: Progressing toward goal PT Goal: Ambulate - Progress: Progressing toward goal  Visit Information  Last PT Received On: 04/10/12 Assistance Needed: +1    Subjective Data  Subjective: I'm over that- referring to fear of falling with ambulation   Cognition  Overall Cognitive Status: Appears within functional limits for tasks assessed/performed Difficult to assess due to: Tracheostomy (PMV) Arousal/Alertness: Awake/alert Orientation Level: Appears intact for tasks assessed Behavior During Session: [facility] for tasks performed    Balance     End of Session PT - End of Session Equipment Utilized During Treatment: Gait belt Activity Tolerance: Patient tolerated treatment well Patient left: in chair;with call bell/phone within reach Nurse Communication: Mobility status    Delorse Lek 04/10/2012, 3:18 PM Delaney Meigs, PT [phone]

## 2012-04-10 NOTE — Progress Notes (Signed)
Nutrition Follow-up  Pt started on 24 hr trach collar on 6/21. RN reports pt is tolerating TF regimen well at this time and diarrhea has improved since last week.  Diet Order:  NPO TF: Jevity 1.2 at 55 ml/hr with 30 ml Prostat via tube BID, provides: 1784 kcal, 104 grams protein, 1065 ml free water  Free water of 200 ml q 4 hours; provides an additional 1200 ml daily  Meds: Scheduled Meds:   . acetylcysteine  4 mL Nebulization Q6H  . albuterol  2.5 mg Nebulization QID  . antiseptic oral rinse  15 mL Mouth Rinse QID  . budesonide  0.5 mg Nebulization BID  . chlorhexidine  15 mL Mouth Rinse BID  . enoxaparin  40 mg Subcutaneous Q24H  . escitalopram  10 mg Oral Daily  . feeding supplement  30 mL Per Tube BID  . fentaNYL  100 mcg Intravenous Once  . free water  200 mL Per Tube Q4H  . levofloxacin (LEVAQUIN) IV  750 mg Intravenous Q24H  . metoprolol tartrate  12.5 mg Per Tube BID  . midazolam  2 mg Intravenous Once  . pantoprazole sodium  40 mg Per Tube QHS  . sodium chloride  3 mL Intravenous Q12H  . white petrolatum      . DISCONTD: acetylcysteine  2 mL Nebulization QID   Continuous Infusions:   . sodium chloride 500 mL (04/08/12 1200)  . feeding supplement (JEVITY 1.2 CAL) 1,000 mL (04/10/12 0257)   PRN Meds:.albuterol, ALPRAZolam, bisacodyl, fentaNYL, hydrALAZINE, phenol, silver nitrate applicators  Labs:  CMP     Component Value Date/Time   NA 135 04/08/2012 0942   K 3.7 04/08/2012 0942   CL 98 04/08/2012 0942   CO2 29 04/08/2012 0942   GLUCOSE 94 04/08/2012 0942   BUN 12 04/08/2012 0942   CREATININE 0.56 04/08/2012 0942   CALCIUM 8.4 04/08/2012 0942   PROT 6.1 03/29/2012 0420   ALBUMIN 1.9* 03/29/2012 0420   AST 13 03/29/2012 0420   ALT 10 03/29/2012 0420   ALKPHOS 49 03/29/2012 0420   BILITOT 0.3 03/29/2012 0420   GFRNONAA >90 04/08/2012 0942   GFRAA >90 04/08/2012 0942     Intake/Output Summary (Last 24 hours) at 04/10/12 0830 Last data filed at 04/10/12 1610  Gross per  24 hour  Intake   1823 ml  Output    850 ml  Net    973 ml   Ht: 6\' 2"  (1.88 m)  Weight Status:  70 kg - wt trending down  Re-estimated needs:  1750-1850 kcals, 90 - 110 grams protein daily  Nutrition Dx:  Inadequate oral intake - ongoing  Goal:  Intake to meet 90-100% of estimated nutrition needs  Intervention:  Continue current regimen at this time.  Monitor:  Weights, labs, PO intake, I/O's, vent use  Adair Laundry Pager #:  [phone]

## 2012-04-10 NOTE — Progress Notes (Signed)
Occupational Therapy Treatment Patient Details Name: Michael Ellison MRN: [id number] DOB: 05/23/1946 Today's Date: 04/10/2012 Time: 3086-5784 OT Time Calculation (min): 54 min  OT Assessment / Plan / Recommendation Comments on Treatment Session Pt making good progress.  Goals updated.    Follow Up Recommendations  Skilled nursing facility    Barriers to Discharge       Equipment Recommendations  Defer to next venue    Recommendations for Other Services    Frequency Min 2X/week   Plan Discharge plan remains appropriate    Precautions / Restrictions Precautions Precautions: Fall Precaution Comments: trach, peg Restrictions Weight Bearing Restrictions: No        ADL  Grooming: Performed;Wash/dry face;Brushing hair;Set up Where Assessed - Grooming: Unsupported sitting Lower Body Dressing: Performed;Set up Where Assessed - Lower Body Dressing: Sopported sit to stand Toilet Transfer: Simulated;Minimal assistance (+1 for lines using RW) Toilet Transfer Method: Stand pivot;Sit to Barista:  (from bed to recliner 2 feet away) Equipment Used: Rolling walker Transfers/Ambulation Related to ADLs: +1 min A with +1 for lines. Attempted to use PMSV x2 however pt's Sats kept dropping below 90 so PMSV removed. At end session, pt coughing up copious amounts of secretions mouth and trach--pt suctioned. ADL Comments: Educated pt on use of inspirometer and do to it every hour that he is awake 10 times slowly    OT Diagnosis:    OT Problem List:   OT Treatment Interventions:     OT Goals--revised today Acute Rehab OT Goals OT Goal Formulation: With patient Time For Goal Achievement: 04/24/12 Potential to Achieve Goals: Good ADL Goals Pt Will Perform Grooming: with supervision;Standing at sink;Unsupported (2 tasks) ADL Goal: Grooming - Progress: Goal set today  (pt did make progress towards goals set 03/28/2012) Pt Will Perform Upper Body Bathing: with  set-up;Sitting, edge of bed;Sitting, chair;Unsupported ADL Goal: Upper Body Bathing - Progress: Goal set today Pt Will Perform Lower Body Bathing: Unsupported;Sit to stand from chair;Sit to stand from bed;with set-up;with supervision ADL Goal: Lower Body Bathing - Progress: Goal set today Pt Will Perform Upper Body Dressing: with set-up;Sitting, chair;Sitting, bed;Unsupported ADL Goal: Upper Body Dressing - Progress: Goal set today Pt Will Perform Lower Body Dressing: with set-up;with supervision;Unsupported;Sit to stand from chair;Sit to stand from bed ADL Goal: Lower Body Dressing - Progress: Goal set today (pt did make progress towards goals set 03/28/2012) Pt Will Transfer to Toilet: with supervision;Ambulation;Comfort height toilet;Regular height toilet;Grab bars ADL Goal: Toilet Transfer - Progress: Goal set today (pt did make progress towards goals set 03/28/2012) Pt Will Perform Toileting - Clothing Manipulation: with supervision;Standing ADL Goal: Toileting - Clothing Manipulation - Progress: Goal set today Pt Will Perform Toileting - Hygiene: with min assist;Sit to stand from 3-in-1/toilet ADL Goal: Toileting - Hygiene - Progress: Goal set today Arm Goals Additional Arm Goal #1: Pt will be supervision with Bil UE stregthening using Level 1 or 2 theraband Arm Goal: Additional Goal #1 - Progress: Goal set today Miscellaneous OT Goals Miscellaneous OT Goal #1: Pt will verbalize and use energy conservation techniques during treatment sessions with supervision. OT Goal: Miscellaneous Goal #1 - Progress: Goal set today (pt did make progress towards goals set 03/28/2012)  Visit Information  Last OT Received On: 04/10/12 Assistance Needed: +1 (with +1 for lines)    Subjective Data      Prior Functioning       Cognition  Overall Cognitive Status: Appears within functional limits for tasks assessed/performed Arousal/Alertness: Awake/alert  Orientation Level: Appears intact for tasks  assessed Behavior During Session: [facility] for tasks performed    Mobility Bed Mobility Rolling Left: 6: Modified independent (Device/Increase time);With rail Left Sidelying to Sit: 6: Modified independent (Device/Increase time);With rails;HOB elevated (22 degrees (what bed was set at upon arrival)) Sitting - Scoot to Edge of Bed: 5: Supervision;With rail Transfers Transfers: Sit to Stand;Stand to Sit Sit to Stand: 4: Min assist;With upper extremity assist;From bed (with +1 lines) Stand to Sit: 4: Min guard;With armrests;With upper extremity assist;To chair/3-in-1 (+1 for lines) Details for Transfer Assistance: No cues for backing all the way back to chair or to use arms of chair.  However did need cues to push up from bed and not pull up on walker   Exercises    Balance    End of Session OT - End of Session Activity Tolerance: Patient tolerated treatment well (without PMSV) Patient left: in chair;with call bell/phone within reach Nurse Communication:  (decrease Sats with PMSV and BP line not working)   Evette Georges [phone] 04/10/2012, 10:35 AM

## 2012-04-10 NOTE — Progress Notes (Signed)
Clinical Child psychotherapist received phone call from [employer] at [place].  Tammy states they are able to accept pt, but will need the [facility] Application.  CSW phoned pt's dtr-Tameka, and left voicemail message requesting a return phone call.  CSW left messages with [facility], [facility], and [facility] inquiring about their wait list.   CSW received return phone call from [place].  Wandra Mannan reports she spoke with [facility] over the weekend and was informed they did not currently have any male beds available.  CSW provided opportunity for dtr to process questions/concerns.  Dtr requested to meet with CSW and stated she would be available today at 3:00pm.  Dtr to phone CSW once available.  CSW to continue to follow and assist as needed.   Angelia Mould, MSW, [place] [phone]

## 2012-04-10 NOTE — Progress Notes (Signed)
Clinical Child psychotherapist met with pt, two dtrs, and RNCM.  CSW reviewed current plan of care.  Dtrs and pt in agreement with current plan.  Dtr to complete [place] Application and leave with RN.  CSW to continue to follow and assist as needed.   Angelia Mould, MSW, [place] [phone]

## 2012-04-11 NOTE — Progress Notes (Signed)
Speech Language Pathology Dysphagia Treatment Patient Details Name: Michael Ellison MRN: [id number] DOB: [birth year] Today's Date: 04/11/2012 Time: 1038-1100 SLP Time Calculation (min): 22 min  Assessment / Plan / Recommendation Clinical Impression  Treatment focused on PO trials to determine ability to participate in repeat objective test. Pt demonstrates multiple swallows though dry vocal quality post swallow with all consistencies in contrast to previous observations. Pt did have some delayed coughing with expectoration of puree, indicative of standing residuals in pharynx with possiblity of aspiraiton. Objective test warranted to determine if pt may inititiate POs for therapeutic trials/pleasure prior to d/c. Will complete MBS tomorrow.     Diet Recommendation  Continue with Current Diet: NPO    SLP Plan MBS   Pertinent Vitals/Pain NA   Swallowing Goals     General Temperature Spikes Noted: No Respiratory Status: Trach Behavior/Cognition: Alert;Cooperative Oral Cavity - Dentition: Adequate natural dentition Patient Positioning: Upright in bed  Oral Cavity - Oral Hygiene Does patient have any of the following "at risk" factors?: Oxygen therapy - cannula, mask, simple oxygen devices Patient is HIGH RISK - Oral Care Protocol followed (see row info): Yes   Dysphagia Treatment Treatment focused on: Upgraded PO texture trials Treatment Methods/Modalities: Effortful swallow Patient observed directly with PO's: Yes Type of PO's observed: Thin liquids;Dysphagia 1 (puree);Ice chips Feeding: Able to feed self Liquids provided via: Cup;Teaspoon Pharyngeal Phase Signs & Symptoms: Delayed throat clear;Delayed cough (expectoration of puree) Type of cueing: Verbal Amount of cueing: Minimal   Michael Ellison, [name] 04/11/2012, 1:02 PM  Speech Language Pathology Treatment Patient Details Name: Michael Ellison MRN: [id number] DOB: 1946-04-15 Today's Date: 04/11/2012 Time:  1038-1100 SLP Time Calculation (min): 22 min  Assessment / Plan / Recommendation Clinical Impression  Pt with PMSV in place on cuffed XL 6 shiley despite recommendation to wear only with full supervision. Pt with O2 saturation in low 90s with RR in mid twenties. Pt tolerated vavle throughout session with no CO2 trapping with intermittent checks. SLP removed valve for  3 minutes to determine if Sats would rise. No significant change. with replacement of vavle for phone conversation with family sats rose to 94. Feel saturation level is not dependent on PMSV. Given that secretions have decreased, Pt may continue to wear vavle with intermittent supervision since he has been doing so anyways.     SLP Plan  MBS    Pertinent Vitals/Pain NA  SLP Goals  SLP Goals SLP Goal #1: Pt will tolerate PMSV during all waking hours with intermittent supervision without distress.  SLP Goal #1 - Progress: Progressing toward goal  General Temperature Spikes Noted: No Respiratory Status: Trach Behavior/Cognition: Alert;Cooperative Oral Cavity - Dentition: Adequate natural dentition Patient Positioning: Upright in bed  Oral Cavity - Oral Hygiene Does patient have any of the following "at risk" factors?: Oxygen therapy - cannula, mask, simple oxygen devices Patient is HIGH RISK - Oral Care Protocol followed (see row info): Yes   Treatment     Michael Ellison, [name] 04/11/2012, 1:14 PM

## 2012-04-11 NOTE — Progress Notes (Signed)
Name: Michael Ellison MRN: [id number] DOB: 1946-04-05    LOS: 47 Date of admit 02/24/2012  8:34 AM  PULMONARY / CRITICAL CARE MEDICINE  Pt Profile:   66 y/o male smoker admitted to [facility] 02/24/2012 with n/v and abdominal pain due to SBO due to adhesions.  Had ex lap with SB resection 5/10.  Developed delirium post-op.  Transferred to [facility] 5/14 for respiratory failure requiring intubation.  Developed abdominal fascial dehiscence and taken back to OR 5/16.  Failed extubation, and required tracheostomy 5/22. PMHx ETOH, HTN, Prostate cancer  Interval hx:  5/16: OR for fascial dehiscence repair- closed with wound vac 5/18- failed weaning 5/19- extubated 5/21- re-intubated for hypercarbia 5/22-improved co2 and neurostatus, pos almost 2 liters 5/22 SVT>>resolved spontaneously 5/30 -off vent for 24 hours, planned Panda placement 6/1 - up in chair, improved. 6/3 - desat, increased WOB, vent 6/3 - leak trach, improved with extra long 6/5 - FEES eval: failed, still showing evidence of aspiration.  6/16 - Few liquid stools with streaks of blood,  H/h stable.  (has been having mucoid / bloody streaked stool -surgeons aware) 6/18 Perc gastrostomy per IR 6/21 started 24 h T collar 6/25 - still on TC. Did PMV  Subjective: Comfortable on T collar overnight; no changes overnight . But on 40% trach collar On PMV talked.Feels well. Speech planning swallow eval  Vital Signs: Temp:  [98.3 F (36.8 C)-99.2 F (37.3 C)] 98.4 F (36.9 C) (06/25 1150) Pulse Rate:  [82-96] 96  (06/25 1600) Resp:  [21-29] 29  (06/25 1600) BP: (100-118)/(65-78) 108/75 mmHg (06/25 1600) SpO2:  [92 %-96 %] 93 % (06/25 1600) FiO2 (%):  [40 %] 40 % (06/25 1544) Weight:  [73.6 kg (162 lb 4.1 oz)] 73.6 kg (162 lb 4.1 oz) (06/25 0000)  Physical Examination: General - Alert and interactive. HEENT - trach site clean, #6.0 xlt Cardiac - regular, no m/r/g Chest -a few scattered crackles, no wheezing, good airflow x decreased L  Base. Abd - round/soft, bs+ Ext - no edema Neuro - awake and interacting, moves all ext. Talking. Looking stronger  Intake/Output Summary (Last 24 hours) at 04/11/12 1706 Last data filed at 04/11/12 1200  Gross per 24 hour  Intake   1535 ml  Output   1100 ml  Net    435 ml   CBC  Lab 04/08/12 0942 04/07/12 0405 04/06/12 0415  HGB 11.5* 11.4* 11.5*  HCT 34.8* 35.1* 34.7*  WBC 9.6 10.2 9.7  PLT 225 235 226   BMET  Lab 04/08/12 0942 04/07/12 0405 04/06/12 0415 04/05/12 0417  NA 135 135 135 138  K 3.7 3.3* -- --  CL 98 98 99 103  CO2 29 30 26 28   GLUCOSE 94 106* 75 72  BUN 12 14 15 13   CREATININE 0.56 0.82 0.68 0.77  CALCIUM 8.4 8.4 8.6 8.8  MG 1.9 1.7 1.9 1.6  PHOS 2.1* 2.4 3.2 3.0   Lab Results  Component Value Date   ALT 10 03/29/2012   AST 13 03/29/2012   ALKPHOS 49 03/29/2012   BILITOT 0.3 03/29/2012    Intake/Output Summary (Last 24 hours) at 04/11/12 1706 Last data filed at 04/11/12 1200  Gross per 24 hour  Intake   1535 ml  Output   1100 ml  Net    435 ml   pcxr 6/23 Bibasilar airspace opacities stable on the left and worse on the  right. Left pleural effusions stable  No results found.  ASSESSMENT AND PLAN  Acute hypoxic/hypercapnic respiratory failure 2nd to delirium, aspiration pneumonia, 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  Failed extubation, and s/p trach 5/22. Xtra long 6 cuffed 6/3>>>   On 6/25: on trach collar since 6/21 but on 40% fio2. Doing well on PMV  Plan: -Cont scheduled BDs, will continue ICS -TC as tolerated 24 h per day effective 6/21 -Cont vibravest.    SBO s/p lap with SB resection 5/10 complicated by post-op ileus and fascial dehiscence, and return to OR 5/16. Chronic pain. Dysphagia   - on 6./24: tube feeds via peg. Per nutrition meeting 90% of goal  - on 6/25: no change Plan: - Post-op care per CCS, appreciate wound care - Repeat swallow eval pending 6/26.  Hopeful to allow to eat but will keep  PEG due to prolonged illness for now.     Mild diarrhea with blood streaks: Small quantity, cdiff negative, CBC stable.  If worsens, will need to change lovenox to SCD.  He is at very high risk for TED, so would like to leave on lovenox if possible. - on 6/24: still frequent stools and is s/p rectal pouch  6/21   INFECTIOUS DZ Cultures:  BCX2 5/14 >>1/2 gram neg rod>>> 1/2 Klebsiella  UC 5/14 >>Enterococcus (sensitive to ampicillin)  Sputum 5/14>> Klebsiella pneumoniae (pan sensitive)  Wound 5/14>>Klebsiella Pneumoniae (pan sensitive) & Enterobacter Cloacae (resistant to ancef, cefoxitin)  BC X2 5/20>>>ng  Sputum 5/22>>>klebs, enterobacter  Sputum 6/7>KLEBSIELLA PNEUMONIAE and STENOTROPHOMONAS MALTOPHILIA.   Antibiotics:  vanc 5/14 >> 5/17  Zosyn 5/13 >>5/28  Vanc 6/9>>>6/11 Ceftaz 6/9 (klebsiella  Sputum and wound)> 6/21 levaquin (stenotrophomonas) 6/11>>>  (6/25)  Wound infection, aspiration pneumonia, UTI.  Plan:  -levaquin 14 days will be complete 6/25    Hypertension-on lisinopril  / norvasc at home.  Had been on Q8 IV metoprolol.  Now tolerating TF's.  Plan: -Changed to lopressor PO 6/17 (12.5 bid), well tolerated.    DISPO  - snf v vent snf pending: likley can go on trach collar and anticipate continued improvement     Dr. Kalman Shan, M.D., [facility].C.P Pulmonary and Critical Care Medicine Staff Physician [facility]monary and Critical Care Pager: [phone], If no answer or between  15:00h - 7:00h: call [phone]  04/11/2012 5:06 PM

## 2012-04-12 ENCOUNTER — Inpatient Hospital Stay (HOSPITAL_COMMUNITY): Payer: Medicare Other

## 2012-04-12 DIAGNOSIS — Z930 Tracheostomy status: Secondary | ICD-10-CM

## 2012-04-12 MED ORDER — POTASSIUM & SODIUM PHOSPHATES 280-160-250 MG PO PACK
1.0000 | PACK | Freq: Three times a day (TID) | ORAL | Status: AC
  Administered 2012-04-12 (×3): 1 via ORAL
  Filled 2012-04-12 (×3): qty 1

## 2012-04-12 NOTE — Progress Notes (Signed)
Pt getting a bath at this time, all monitors were off

## 2012-04-12 NOTE — Progress Notes (Signed)
Wound looks fine.  Continue current dressing changes.  Wilmon Arms. Corliss Skains, MD, [facility]  04/12/2012 11:01 AM

## 2012-04-12 NOTE — Progress Notes (Signed)
Name: Michael Ellison MRN: [id number] DOB: 02/27/1946    LOS: 48 Date of admit 02/24/2012  8:34 AM  PULMONARY / CRITICAL CARE MEDICINE  Pt Profile:   66 y/o male smoker admitted to [facility] 02/24/2012 with n/v and abdominal pain due to SBO due to adhesions.  Had ex lap with SB resection 5/10.  Developed delirium post-op.  Transferred to [facility] 5/14 for respiratory failure requiring intubation.  Developed abdominal fascial dehiscence and taken back to OR 5/16.  Failed extubation, and required tracheostomy 5/22. PMHx ETOH, HTN, Prostate cancer  Interval hx:  5/16: OR for fascial dehiscence repair- closed with wound vac 5/18- failed weaning 5/19- extubated 5/21- re-intubated for hypercarbia 5/22-improved co2 and neurostatus, pos almost 2 liters 5/22 SVT>>resolved spontaneously 5/30 -off vent for 24 hours, planned Panda placement 6/1 - up in chair, improved. 6/3 - desat, increased WOB, vent 6/3 - leak trach, improved with extra long 6/5 - FEES eval: failed, still showing evidence of aspiration.  6/16 - Few liquid stools with streaks of blood,  H/h stable.  (has been having mucoid / bloody streaked stool -surgeons aware) 6/18 Perc gastrostomy per IR 6/21 started 24 h T collar 6/25 - still on TC. Did PMV  Subjective: Comfortable on T collar overnight; no changes overnight . But on 40% trach collar Speech ok.Feels well.   Vital Signs: Temp:  [98.4 F (36.9 C)-98.5 F (36.9 C)] 98.5 F (36.9 C) (06/26 0739) Pulse Rate:  [82-96] 88  (06/26 0739) Resp:  [22-29] 26  (06/26 0739) BP: (105-118)/(65-77) 112/74 mmHg (06/26 0739) SpO2:  [91 %-98 %] 91 % (06/26 0836) FiO2 (%):  [40 %] 40 % (06/26 0836) Weight:  [72.5 kg (159 lb 13.3 oz)] 72.5 kg (159 lb 13.3 oz) (06/25 2346)  Physical Examination: General - Alert and interactive. HEENT - trach site clean, #6.0 xlt Cardiac - regular, no m/r/g Chest -a few scattered crackles, no wheezing, good airflow x decreased L Base. Abd - round/soft, bs+ Ext  - no edema Neuro - awake and interacting, moves all ext. Talking. Looking stronger  Intake/Output Summary (Last 24 hours) at 04/12/12 1154 Last data filed at 04/12/12 1000  Gross per 24 hour  Intake   1083 ml  Output   1500 ml  Net   -417 ml   CBC  Lab 04/08/12 0942 04/07/12 0405 04/06/12 0415  HGB 11.5* 11.4* 11.5*  HCT 34.8* 35.1* 34.7*  WBC 9.6 10.2 9.7  PLT 225 235 226   BMET  Lab 04/08/12 0942 04/07/12 0405 04/06/12 0415  NA 135 135 135  K 3.7 3.3* --  CL 98 98 99  CO2 29 30 26   GLUCOSE 94 106* 75  BUN 12 14 15   CREATININE 0.56 0.82 0.68  CALCIUM 8.4 8.4 8.6  MG 1.9 1.7 1.9  PHOS 2.1* 2.4 3.2   Lab Results  Component Value Date   ALT 10 03/29/2012   AST 13 03/29/2012   ALKPHOS 49 03/29/2012   BILITOT 0.3 03/29/2012    Intake/Output Summary (Last 24 hours) at 04/12/12 1154 Last data filed at 04/12/12 1000  Gross per 24 hour  Intake   1083 ml  Output   1500 ml  Net   -417 ml   pcxr 6/23 Bibasilar airspace opacities stable on the left and worse on the  right. Left pleural effusions stable  No results found.    ASSESSMENT AND PLAN  Acute hypoxic/hypercapnic respiratory failure 2nd to delirium, aspiration pneumonia, probable COPD with bullous emphysema  with hx of smoking.  Failed extubation, and s/p trach 5/22. Xtra long 6 cuffed 6/3>>>   Plan: -Cont scheduled BDs, will continue ICS -TC as tolerated 24 h per day effective 6/21 -Cont vibravest. -pmv as tolerated -trach change at some point    SBO s/p lap with SB resection 5/10 complicated by post-op ileus and fascial dehiscence, and return to OR 5/16. Chronic pain. Dysphagia MBS 6/26 >>primary severe cervical esophageal dysphagia with a large Zenker's Diverticulum confirmed by radiologist   Plan: - Post-op care per CCS, appreciate wound care -ct peg -ENT eval  Hypophosphatemia/ hypokalemia - repleted  Mild diarrhea with blood streaks: Small quantity, cdiff negative, CBC stable.  If worsens,  will need to change lovenox to SCD.  He is at very high risk for TED, so would like to leave on lovenox if possible. - on 6/24: still frequent stools and is s/p rectal pouch  6/21   INFECTIOUS DZ Cultures:  BCX2 5/14 >>1/2 gram neg rod>>> 1/2 Klebsiella  UC 5/14 >>Enterococcus (sensitive to ampicillin)  Sputum 5/14>> Klebsiella pneumoniae (pan sensitive)  Wound 5/14>>Klebsiella Pneumoniae (pan sensitive) & Enterobacter Cloacae (resistant to ancef, cefoxitin)  BC X2 5/20>>>ng  Sputum 5/22>>>klebs, enterobacter  Sputum 6/7>KLEBSIELLA PNEUMONIAE and STENOTROPHOMONAS MALTOPHILIA.   Antibiotics:  vanc 5/14 >> 5/17  Zosyn 5/13 >>5/28  Vanc 6/9>>>6/11 Ceftaz 6/9 (klebsiella  Sputum and wound)> 6/21 levaquin (stenotrophomonas) 6/11>>>  (6/25)  Wound infection, aspiration pneumonia, UTI.  Plan:  -levaquin 14 days -completed 6/25    Hypertension-on lisinopril  / norvasc at home.  Had been on Q8 IV metoprolol.  Now tolerating TF's.  Plan: -Changed to lopressor PO 6/17 (12.5 bid), well tolerated.    DISPO  - snf pending: likley can go on trach collar/ peg , anticipate continued improvement with PT   Cyril Mourning MD. FCCP. [facility] Pager [phone] If no response call [phone]    04/12/2012 11:54 AM

## 2012-04-12 NOTE — Procedures (Signed)
Objective Swallowing Evaluation: Modified Barium Swallowing Study  Patient Details  Name: Michael Ellison MRN: [id number] Date of Birth: 1946-05-13  Today's Date: 04/12/2012 Time: 4098-1191 SLP Time Calculation (min): 25 min  Past Medical History:  Past Medical History  Diagnosis Date  . Hypertension   . Cancer   . Cancer of prostate    Past Surgical History:  Past Surgical History  Procedure Date  . Hernia repair   . Prostate biopsy   . Robotic prostate surgery 2011  . Laparotomy 02/25/2012    Procedure: EXPLORATORY LAPAROTOMY;  Surgeon: [name], MD;  Location: [facility];  Service: General;  Laterality: N/A;  . Bowel resection 02/25/2012    Procedure: SMALL BOWEL RESECTION;  Surgeon: [name], MD;  Location: [facility];  Service: General;;  . Wound debridement 03/02/2012    Procedure: DEBRIDEMENT CLOSURE/ABDOMINAL WOUND;  Surgeon: Cherylynn Ridges, MD;  Location: [facility] OR;  Service: General;  Laterality: N/A;   HPI:  66 yo male smoker admitted to [facility] 02/24/2012 with n/v and abdominal pain due to SBO due to adhesions.  Had ex lap with SB resection 5/10.  Developed delirium post-op.  Transferred to [facility] 5/14 for respiratory failure requiring intubation.  Developed fascial dehiscence and taken back to OR 5/16.  Failed extubation 5/19, reintubated and required tracheostomy 5/22.  Has been on and off the vent, PEG placed. Two FEES with no improvemnt. last FEES recommended f/u with MBS due to suspicion for a cervical esophageal dysphagia. Has been tolerating trach collar with PMSV with intermittent supervision for two days.      Assessment / Plan / Recommendation Clinical Impression  Dysphagia Diagnosis: Severe cervical esophageal phase dysphagia;Moderate pharyngeal phase dysphagia Clinical impression: Pt presents with a moderate pharyngeal dysphagia with a delay in initiation in swallow and decreased pharyngeal persitalsis. Pt with  a primary severe cervical esophageal dysphagia with a  large Zenker's Diverticulum confirmed by radiologist, Dr. Pecolia Ades. 80% of bolus pools in outpouching with severe backflow to pyriform sinuses with resulting aspiration of residual down posterior wall of larynx. With head turn, pressure to left neck swallowing and coughing pt is albe to expectorate or swallow only 50% of stasis. Zenker's was present prior to admit but pt had likely been compensating with adequate strength. With decrease in functional reserve pt is not unable to swallow any PO functionally. Pt will need referral to ENT for assessment for corrective measures prior to initiating any PO intake. SLP will await decisions from MD, f/u with pt as needed.     Treatment Recommendation  Patient unable to participate in swallow therapy at this time;Other (Comment) (Will continue to supervise pt with PMV, POC. )    Diet Recommendation NPO   Medication Administration: Via alternative means    Other  Recommendations Recommended Consults: Consider ENT evaluation   Follow Up Recommendations       Frequency and Duration        Pertinent Vitals/Pain NA    SLP Swallow Goals     General HPI: 66 yo male smoker admitted to [facility] 02/24/2012 with n/v and abdominal pain due to SBO due to adhesions.  Had ex lap with SB resection 5/10.  Developed delirium post-op.  Transferred to [facility] 5/14 for respiratory failure requiring intubation.  Developed fascial dehiscence and taken back to OR 5/16.  Failed extubation 5/19, reintubated and required tracheostomy 5/22.  Has been on and off the vent, PEG placed. Two FEES with no improvemnt. last FEES recommended f/u  with MBS due to suspicion for a cervical esophageal dysphagia. Has been tolerating trach collar with PMSV with intermittent supervision for two days.  Type of Study: Modified Barium Swallowing Study Reason for Referral: Objectively evaluate swallowing function Previous Swallow Assessment: FEES 03/16/12, recommended NPO Diet Prior to this Study: NPO;PEG  tube Temperature Spikes Noted: No Respiratory Status: Trach Trach Size and Type: Cuff;Deflated;With PMSV in place;#6;Extra long History of Recent Intubation: Yes Length of Intubations (days): 9 days Date extubated: 03/08/12 Behavior/Cognition: Alert;Cooperative Oral Cavity - Dentition: Adequate natural dentition Oral Motor / Sensory Function: Within functional limits Vision: Functional for self-feeding Patient Positioning: Upright in chair Baseline Vocal Quality: Clear Volitional Cough: Strong Volitional Swallow: Able to elicit Anatomy: Other (Comment) (see impression statement)    Reason for Referral Objectively evaluate swallowing function   Oral Phase     Pharyngeal Phase Pharyngeal Phase: Impaired   Cervical Esophageal Phase Cervical Esophageal Phase: Impaired    Yanet Balliet, [name] 04/12/2012, 10:41 AM

## 2012-04-12 NOTE — Progress Notes (Addendum)
MD notified x 2 that pt's daughter Fredonia Highland would like an update on plan of care for patient.

## 2012-04-12 NOTE — Progress Notes (Signed)
Clinical Social Worker phoned pt's dtr, Wandra Mannan [phone]) and provided opportunity for dtr to address questions/concerns.  CSW reviewed each.  Dtr to email one month of pt's bank statements.  CSW to submit requested financial information to [place] once received.   Angelia Mould, MSW, [place] [phone]

## 2012-04-12 NOTE — Progress Notes (Signed)
Clinical Child psychotherapist received [employer].  CSW submitted application to [facility] for their review.  CSW to continue to follow and assist as needed.   Angelia Mould, MSW, [place] [phone]

## 2012-04-12 NOTE — Progress Notes (Signed)
Physical Therapy Treatment Patient Details Name: Michael Ellison MRN: [id number] DOB: Apr 03, 1946 Today's Date: 04/12/2012 Time: 4098-1191 PT Time Calculation (min): 30 min  PT Assessment / Plan / Recommendation Comments on Treatment Session  Pt desating with ambulation today, down to 78%, is returning to 90's with quick rest of 1 minute but dyspnea and fatigue limiting activity, gait speed only 1.28 ft/sec. PT will continue to follow.    Follow Up Recommendations  Skilled nursing facility;Supervision/Assistance - 24 hour    Barriers to Discharge        Equipment Recommendations  Defer to next venue    Recommendations for Other Services    Frequency Min 3X/week   Plan Discharge plan remains appropriate;Frequency remains appropriate    Precautions / Restrictions Precautions Precautions: Fall Precaution Comments: trach, peg Restrictions Weight Bearing Restrictions: No   Pertinent Vitals/Pain No c/o pain O2 sats 78% at 40% trach collar with ambulation    Mobility  Bed Mobility Bed Mobility: Not assessed Transfers Transfers: Sit to Stand;Stand to Sit Sit to Stand: From chair/3-in-1;4: Min assist;With upper extremity assist (from low surface) Stand to Sit: 5: Supervision;To chair/3-in-1;With armrests Details for Transfer Assistance: cues needed to push up on armrests of chair Ambulation/Gait Ambulation/Gait Assistance: 4: Min guard Ambulation Distance (Feet): 425 Feet Assistive device: Rolling walker Ambulation/Gait Assistance Details: pt needed 3 standing rest breaks due to desat down to 78%, O2 sats returned to 90's within 1 min.  Pt was symptomatic with increased RR and dyspnea. Gait Pattern: Step-through pattern;Decreased stride length Gait velocity: 1.28 ft/sec Stairs: No Wheelchair Mobility Wheelchair Mobility: No    Exercises Other Exercises Other Exercises: attempted exercises end of session but pt too fatigued at that time   PT Diagnosis:    PT Problem List:    PT Treatment Interventions:     PT Goals Acute Rehab PT Goals PT Goal Formulation: With patient Time For Goal Achievement: 04/26/12 Potential to Achieve Goals: Good Pt will go Supine/Side to Sit: Independently PT Goal: Supine/Side to Sit - Progress: Goal set today Pt will Sit at Edge of Bed: Independently;6-10 min;with no upper extremity support PT Goal: Sit at [place] Of Bed - Progress: Met Pt will go Sit to Stand: with modified independence;with upper extremity assist PT Goal: Sit to Stand - Progress: Goal set today Pt will go Stand to Sit: with modified independence;with upper extremity assist PT Goal: Stand to Sit - Progress: Goal set today Pt will Transfer Bed to Chair/Chair to Bed: with modified independence PT Transfer Goal: Bed to Chair/Chair to Bed - Progress: Partly met Pt will Ambulate: >150 feet;with modified independence;with least restrictive assistive device;with gait velocity >(comment) ft/second (>1.72ft/sec) PT Goal: Ambulate - Progress: Goal set today Pt will Perform Home Exercise Program: with supervision, verbal cues required/provided PT Goal: Perform Home Exercise Program - Progress: Goal set today  Visit Information  Last PT Received On: 04/12/12 Assistance Needed: +2 (for equipment)    Subjective Data  Subjective: I want to walk Patient Stated Goal: To go home   Cognition  Overall Cognitive Status: Appears within functional limits for tasks assessed/performed Difficult to assess due to: Tracheostomy Arousal/Alertness: Awake/alert Orientation Level: Appears intact for tasks assessed Behavior During Session: [facility] for tasks performed    Balance  Balance Balance Assessed: Yes Static Standing Balance Static Standing - Balance Support: Left upper extremity supported;During functional activity Static Standing - Level of Assistance: 5: Stand by assistance Dynamic Standing Balance Dynamic Standing - Balance Support: Bilateral upper extremity supported;During  functional activity Dynamic Standing - Level of Assistance: 5: Stand by assistance  End of Session PT - End of Session Equipment Utilized During Treatment: Gait belt Activity Tolerance: Patient limited by fatigue Patient left: in chair;with call bell/phone within reach Nurse Communication: Mobility status   GP   Lyanne Co, PT  Acute Rehab Services  [phone]   Lyanne Co 04/12/2012, 3:02 PM

## 2012-04-12 NOTE — Progress Notes (Signed)
41 Days Post-Op  Subjective: Feels good this am, no c/o offered. Remains on trach collar.Sitting up in bed watching TV.  Objective: Vital signs in last 24 hours: Temp:  [98.4 F (36.9 C)-98.5 F (36.9 C)] 98.5 F (36.9 C) (06/26 0739) Pulse Rate:  [82-96] 88  (06/26 0739) Resp:  [22-29] 26  (06/26 0739) BP: (100-118)/(65-77) 112/74 mmHg (06/26 0739) SpO2:  [91 %-98 %] 97 % (06/26 0739) FiO2 (%):  [40 %] 40 % (06/26 0739) Weight:  [159 lb 13.3 oz (72.5 kg)] 159 lb 13.3 oz (72.5 kg) (06/25 2346) Last BM Date: 04/10/12  Intake/Output from previous day: 06/25 0701 - 06/26 0700 In: 1338 [I.V.:3; NG/GT:400] Out: 1650 [Urine:1150; Stool:500] Intake/Output this shift:    General appearance: alert, cooperative and no distress Abdomen: Wound appears to be granulating in well; current measurements  < 1cm deep by 1 cm wide at widest point, minimal serous drainage from wound, edges appear clean. BS are present, no bloating or tenderness, no masses palpated.  Lab Results:  No results found for this basename: WBC:2,HGB:2,HCT:2,PLT:2 in the last 72 hours BMET No results found for this basename: NA:2,K:2,CL:2,CO2:2,GLUCOSE:2,BUN:2,CREATININE:2,CALCIUM:2 in the last 72 hours PT/INR No results found for this basename: LABPROT:2,INR:2 in the last 72 hours ABG No results found for this basename: PHART:2,PCO2:2,PO2:2,HCO3:2 in the last 72 hours  Studies/Results: No results found.  Anti-infectives: Anti-infectives     Start     Dose/Rate Route Frequency Ordered Stop   03/28/12 1500   levofloxacin (LEVAQUIN) IVPB 750 mg        750 mg 100 mL/hr over 90 Minutes Intravenous Every 24 hours 03/28/12 1348 04/11/12 1642   03/27/12 1600   vancomycin (VANCOCIN) 1,250 mg in sodium chloride 0.9 % 250 mL IVPB  Status:  Discontinued        1,250 mg 166.7 mL/hr over 90 Minutes Intravenous Every 12 hours 03/27/12 0541 03/28/12 1348   03/25/12 0600   vancomycin (VANCOCIN) 750 mg in sodium chloride 0.9 %  150 mL IVPB  Status:  Discontinued        750 mg 150 mL/hr over 60 Minutes Intravenous Every 12 hours 03/24/12 1423 03/27/12 0541   03/24/12 1600   vancomycin (VANCOCIN) 1,500 mg in sodium chloride 0.9 % 500 mL IVPB        1,500 mg 250 mL/hr over 120 Minutes Intravenous  Once 03/24/12 1423 03/24/12 1856   03/24/12 1530   cefTAZidime (FORTAZ) 1 g in dextrose 5 % 50 mL IVPB  Status:  Discontinued        1 g 100 mL/hr over 30 Minutes Intravenous Every 8 hours 03/24/12 1423 04/07/12 1005   03/24/12 1400   vancomycin (VANCOCIN) IVPB 1000 mg/200 mL premix  Status:  Discontinued        1,000 mg 200 mL/hr over 60 Minutes Intravenous Every 12 hours 03/24/12 1353 03/24/12 1400   03/03/12 1800   vancomycin (VANCOCIN) 1,750 mg in sodium chloride 0.9 % 500 mL IVPB  Status:  Discontinued        1,750 mg 250 mL/hr over 120 Minutes Intravenous Every 12 hours 03/03/12 0628 03/03/12 0918   02/29/12 1900   micafungin (MYCAMINE) 100 mg in sodium chloride 0.9 % 100 mL IVPB  Status:  Discontinued        100 mg 100 mL/hr over 1 Hours Intravenous Every 24 hours 02/29/12 1754 03/01/12 0933   02/29/12 1730   vancomycin (VANCOCIN) 1,250 mg in sodium chloride 0.9 % 250 mL IVPB  Status:  Discontinued        1,250 mg 166.7 mL/hr over 90 Minutes Intravenous Every 12 hours 02/29/12 1644 03/03/12 0628   02/29/12 1715   vancomycin (VANCOCIN) IVPB 1000 mg/200 mL premix  Status:  Discontinued        1,000 mg 200 mL/hr over 60 Minutes Intravenous  Once 02/29/12 1637 02/29/12 1647   02/29/12 1645   piperacillin-tazobactam (ZOSYN) IVPB 3.375 g  Status:  Discontinued        3.375 g 100 mL/hr over 30 Minutes Intravenous  Once 02/29/12 1637 02/29/12 1641   02/28/12 1800   piperacillin-tazobactam (ZOSYN) IVPB 3.375 g  Status:  Discontinued        3.375 g 12.5 mL/hr over 240 Minutes Intravenous 4 times per day 02/28/12 1454 02/28/12 1510   02/28/12 1800   piperacillin-tazobactam (ZOSYN) IVPB 3.375 g  Status:   Discontinued        3.375 g 12.5 mL/hr over 240 Minutes Intravenous Every 8 hours 02/28/12 1510 03/14/12 1700   02/25/12 1109   dextrose 5 % with cefOXitin (MEFOXIN) ADS Med     Comments: HOLLIE, CHRISTINA: cabinet override         02/25/12 1109 02/25/12 2314   02/25/12 1100   cefOXitin (MEFOXIN) 1 g in dextrose 5 % 50 mL IVPB  Status:  Discontinued        1 g 100 mL/hr over 30 Minutes Intravenous 60 min pre-op 02/25/12 1100 02/25/12 1542          Assessment/Plan: s/p Procedure(s) (LRB): DEBRIDEMENT CLOSURE/ABDOMINAL WOUND (N/A)  Continue daily wound dressing changes with hydrogel   LOS: 48 days    Gurnoor Ursua 04/12/2012

## 2012-04-13 LAB — RENAL FUNCTION PANEL
CO2: 32 mEq/L (ref 19–32)
GFR calc Af Amer: >90 mL/min (ref >90)
Glucose, Bld: 119 mg/dL — ABNORMAL HIGH (ref 70–99)
Potassium: 3.2 mEq/L — ABNORMAL LOW (ref 3.5–5.1)
Sodium: 139 mEq/L (ref 135–145)

## 2012-04-13 MED ORDER — POTASSIUM CHLORIDE 20 MEQ/15ML (10%) PO LIQD
40.0000 meq | Freq: Once | ORAL | Status: AC
  Administered 2012-04-13: 40 meq
  Filled 2012-04-13: qty 30

## 2012-04-13 NOTE — Progress Notes (Signed)
Name: Michael Ellison MRN: [id number] DOB: 11-Nov-1945    LOS: 49 Date of admit 02/24/2012  8:34 AM  PULMONARY / CRITICAL CARE MEDICINE  Pt Profile:   66 y/o male smoker admitted to [facility] 02/24/2012 with n/v and abdominal pain due to SBO due to adhesions.  Had ex lap with SB resection 5/10.  Developed delirium post-op.  Transferred to [facility] 5/14 for respiratory failure requiring intubation.  Developed abdominal fascial dehiscence and taken back to OR 5/16.  Failed extubation, and required tracheostomy 5/22. PMHx ETOH, HTN, Prostate cancer  Interval hx:  5/16: OR for fascial dehiscence repair- closed with wound vac 5/18- failed weaning 5/19- extubated 5/21- re-intubated for hypercarbia 5/22-improved co2 and neurostatus, pos almost 2 liters 5/22 SVT>>resolved spontaneously 5/30 -off vent for 24 hours, planned Panda placement 6/1 - up in chair, improved. 6/3 - desat, increased WOB, vent 6/3 - leak trach, improved with extra long 6/5 - FEES eval: failed, still showing evidence of aspiration.  6/16 - Few liquid stools with streaks of blood,  H/h stable.  (has been having mucoid / bloody streaked stool -surgeons aware) 6/18 Perc gastrostomy per IR 6/21 started 24 h T collar 6/25 - still on TC. Did PMV  Subjective: Comfortable on T collar overnight; no changes overnight .  on 40% trach collar Speech ok.Feels well. Secretions +  Vital Signs: Temp:  [98.4 F (36.9 C)-98.6 F (37 C)] 98.4 F (36.9 C) (06/27 0816) Pulse Rate:  [85-95] 89  (06/27 0816) Resp:  [20-28] 22  (06/27 0816) BP: (108-116)/(70-76) 109/76 mmHg (06/27 0816) SpO2:  [78 %-98 %] 96 % (06/27 0834) FiO2 (%):  [35 %-40 %] 35 % (06/27 0834) Weight:  [70.2 kg (154 lb 12.2 oz)] 70.2 kg (154 lb 12.2 oz) (06/27 0050)  Physical Examination: General - Alert and interactive. HEENT - trach site clean, #6.0 xlt Cardiac - regular, no m/r/g Chest -a few scattered crackles, no wheezing, good airflow x decreased L Base. Abd -  round/soft, bs+ Ext - no edema Neuro - awake and interacting, moves all ext. Talking. Looking stronger  Intake/Output Summary (Last 24 hours) at 04/13/12 1024 Last data filed at 04/13/12 0926  Gross per 24 hour  Intake   2140 ml  Output    625 ml  Net   1515 ml   CBC  Lab 04/08/12 0942 04/07/12 0405  HGB 11.5* 11.4*  HCT 34.8* 35.1*  WBC 9.6 10.2  PLT 225 235   BMET  Lab 04/13/12 0425 04/08/12 0942 04/07/12 0405  NA 139 135 135  K 3.2* 3.7 --  CL 99 98 98  CO2 32 29 30  GLUCOSE 119* 94 106*  BUN 14 12 14   CREATININE 0.67 0.56 0.82  CALCIUM 8.3* 8.4 8.4  MG -- 1.9 1.7  PHOS 3.1 2.1* 2.4   Lab Results  Component Value Date   ALT 10 03/29/2012   AST 13 03/29/2012   ALKPHOS 49 03/29/2012   BILITOT 0.3 03/29/2012    Intake/Output Summary (Last 24 hours) at 04/13/12 1024 Last data filed at 04/13/12 0926  Gross per 24 hour  Intake   2140 ml  Output    625 ml  Net   1515 ml   pcxr 6/23 Bibasilar airspace opacities stable on the left and worse on the  right. Left pleural effusions stable  Dg Swallowing Func-no Report  04/12/2012  CLINICAL DATA: objectively evaluate swallow   FLUOROSCOPY FOR SWALLOWING FUNCTION STUDY:  Fluoroscopy was provided for swallowing function  study, which was  administered by a speech pathologist.  Final results and recommendations  from this study are contained within the speech pathology report.        ASSESSMENT AND PLAN  Acute hypoxic/hypercapnic respiratory failure 2nd to delirium, aspiration pneumonia, 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  Failed extubation, and s/p trach 5/22. Xtra long 6 cuffed 6/3>>>   Plan: -Cont scheduled BDs, will continue ICS -TC as tolerated 24 h per day effective 6/21, drop FIo2, satn 88% ok -Cont vibravest. -pmv as tolerated -trach change at some point    SBO s/p lap with SB resection 5/10 complicated by post-op ileus and fascial dehiscence, and return to OR 5/16. Chronic  pain. Dysphagia MBS 6/26 >>primary severe cervical esophageal dysphagia with a large Zenker's Diverticulum confirmed by radiologist   Plan: - Post-op care per CCS, appreciate wound care -ct peg -ENT eval  Hypophosphatemia/ hypokalemia - repleted  Mild diarrhea with blood streaks: Small quantity, cdiff negative, CBC stable.  If worsens, will need to change lovenox to SCD.  He is at very high risk for TED, so would like to leave on lovenox if possible. - on 6/24: still frequent stools and is s/p rectal pouch  6/21   INFECTIOUS DZ Cultures:  BCX2 5/14 >>1/2 gram neg rod>>> 1/2 Klebsiella  UC 5/14 >>Enterococcus (sensitive to ampicillin)  Sputum 5/14>> Klebsiella pneumoniae (pan sensitive)  Wound 5/14>>Klebsiella Pneumoniae (pan sensitive) & Enterobacter Cloacae (resistant to ancef, cefoxitin)  BC X2 5/20>>>ng  Sputum 5/22>>>klebs, enterobacter  Sputum 6/7>KLEBSIELLA PNEUMONIAE and STENOTROPHOMONAS MALTOPHILIA.   Antibiotics:  vanc 5/14 >> 5/17  Zosyn 5/13 >>5/28  Vanc 6/9>>>6/11 Ceftaz 6/9 (klebsiella  Sputum and wound)> 6/21 levaquin (stenotrophomonas) 6/11>>>  (6/25)  Wound infection, aspiration pneumonia, UTI.  Plan:  -levaquin 14 days -completed 6/25, observe off abx    Hypertension-on lisinopril  / norvasc at home.  Had been on Q8 IV metoprolol.   -Changed to lopressor PO 6/17 (12.5 bid) Plan: stable    DISPO  - snf pending: likley can go on trach collar/ peg , anticipate continued improvement with PT -Updated daughter Fredonia Highland 6/26   Cyril Mourning MD. [facility]. [place] [facility] Pager [phone] If no response call [phone]    04/13/2012 10:24 AM

## 2012-04-13 NOTE — Progress Notes (Signed)
MD, please advise as to when and whom will change PT trach. Currently PT has #6 XLT Proximal Shiley trach. Thank you-

## 2012-04-14 MED ORDER — POTASSIUM CHLORIDE 20 MEQ/15ML (10%) PO LIQD
40.0000 meq | Freq: Once | ORAL | Status: AC
  Administered 2012-04-14: 40 meq
  Filled 2012-04-14: qty 30

## 2012-04-14 MED ORDER — FUROSEMIDE 8 MG/ML PO SOLN
40.0000 mg | Freq: Every day | ORAL | Status: DC
Start: 2012-04-14 — End: 2012-04-19
  Administered 2012-04-14 – 2012-04-18 (×5): 40 mg
  Filled 2012-04-14 (×6): qty 5

## 2012-04-14 NOTE — Progress Notes (Signed)
Physical Therapy Treatment Patient Details Name: Michael Ellison MRN: [id number] DOB: 07/16/1946 Today's Date: 04/14/2012 Time: 4098-1191 PT Time Calculation (min): 34 min  PT Assessment / Plan / Recommendation Comments on Treatment Session  Pt wtih improved saturation today, decreasing to 86% without PMSV during gait, but recovered standing in <7min. Pt continues to make good progress, but limited by fatigue.     Follow Up Recommendations  Skilled nursing facility;Supervision/Assistance - 24 hour    Barriers to Discharge        Equipment Recommendations  Defer to next venue    Recommendations for Other Services    Frequency Min 3X/week   Plan Discharge plan remains appropriate;Frequency remains appropriate    Precautions / Restrictions Precautions Precautions: Fall Precaution Comments: trach, peg Restrictions Weight Bearing Restrictions: No   Pertinent Vitals/Pain No complaints    Mobility  Bed Mobility Bed Mobility: Not assessed Transfers Sit to Stand: 4: Min guard;With upper extremity assist;From chair/3-in-1 Stand to Sit: 5: Supervision;With upper extremity assist;With armrests;To chair/3-in-1 Stand Pivot Transfers: 4: Min guard Details for Transfer Assistance: Min-guard for safety only, but pt moving very easily and safely. Increased time requred and small steps chair<>BSC. Pt needing cues for safe hand placement Ambulation/Gait Ambulation/Gait Assistance: 5: Supervision;4: Min guard Ambulation Distance (Feet): 225 Feet Assistive device: Rolling walker Ambulation/Gait Assistance Details: Pt moving rather easily, but needing min-guard assist for turns and tight spaces with cues to slow down. Pt needed 2 standing rest breaks with 02 dropping to 86%, recovering within .  Gait Pattern: Step-through pattern Gait velocity: 1.67ft/sec General Gait Details: DIstance limited by fatigue today.  Stairs: No Wheelchair Mobility Wheelchair Mobility: No    Exercises Total  Joint Exercises Ankle Circles/Pumps: AROM;Both;10 reps;Seated Long Arc Quad: AROM;Both;10 reps;Seated General Exercises - Lower Extremity Hip Flexion/Marching: AROM;Both;20 reps;Seated   PT Diagnosis:    PT Problem List:   PT Treatment Interventions:     PT Goals Acute Rehab PT Goals Pt will go Sit to Stand: with modified independence;with upper extremity assist PT Goal: Sit to Stand - Progress: Progressing toward goal Pt will go Stand to Sit: with modified independence;with upper extremity assist PT Goal: Stand to Sit - Progress: Progressing toward goal Pt will Ambulate: >150 feet;with modified independence;with least restrictive assistive device;with gait velocity >(comment) ft/second PT Goal: Ambulate - Progress: Progressing toward goal  Visit Information  Last PT Received On: 04/14/12 Assistance Needed: +2 (For lines/equipment only)    Subjective Data  Subjective: I need to use the pot Patient Stated Goal: To go home   Market researcher Standing Balance Static Standing - Balance Support: Left upper extremity supported;During functional activity Static Standing - Level of Assistance: 5: Stand by assistance  End of Session PT - End of Session Equipment Utilized During Treatment: Gait belt Activity Tolerance: Patient limited by fatigue Patient left: in chair;with call bell/phone within reach Nurse Communication: Mobility status   GP     [name] Cagey, [place] [phone] 04/14/2012, 12:11 PM

## 2012-04-14 NOTE — Progress Notes (Signed)
Clinical Social Worker spoke by phone with pt's dtr-[place] [phone]).  CSW provided opportunity for pt to process concerns/questions.  All addressed.  CSW to continue to follow and assist as needed.   Eulah Citizen, MSW, [place] [phone]

## 2012-04-14 NOTE — Progress Notes (Signed)
Name: Michael Ellison MRN: [id number] DOB: [birth year]    LOS: 50 Date of admit 02/24/2012  8:34 AM  PULMONARY / CRITICAL CARE MEDICINE  Pt Profile:   66 y/o male smoker admitted to [facility] 02/24/2012 with n/v and abdominal pain due to SBO due to adhesions.  Had ex lap with SB resection 5/10.  Developed delirium post-op.  Transferred to [facility] 5/14 for respiratory failure requiring intubation.  Developed abdominal fascial dehiscence and taken back to OR 5/16.  Failed extubation, and required tracheostomy 5/22. Weaned to ATC, MBS showed zenkers' diverticulum. PMHx ETOH, HTN, Prostate cancer  Interval hx:  5/16: OR for fascial dehiscence repair- closed with wound vac 5/18- failed weaning 5/19- extubated 5/21- re-intubated for hypercarbia 5/22-improved co2 and neurostatus, pos almost 2 liters 5/22 SVT>>resolved spontaneously 5/30 -off vent for 24 hours, planned Panda placement 6/1 - up in chair, improved. 6/3 - desat, increased WOB, vent 6/3 - leak trach, improved with extra long 6/5 - FEES eval: failed, still showing evidence of aspiration.  6/16 - Few liquid stools with streaks of blood,  H/h stable.  (has been having mucoid / bloody streaked stool -surgeons aware) 6/18 Perc gastrostomy per IR 6/21 started 24 h T collar 6/25 - still on TC. Did PMV  Subjective: Comfortable on T collar overnight; no changes overnight .  on 35% trach collar Speech ok.Feels well. Secretions +  Vital Signs: Temp:  [98.3 F (36.8 C)-98.5 F (36.9 C)] 98.4 F (36.9 C) (06/28 1600) Pulse Rate:  [81-99] 88  (06/28 1600) Resp:  [21-34] 25  (06/28 1600) BP: (102-135)/(48-94) 109/70 mmHg (06/28 1600) SpO2:  [91 %-96 %] 93 % (06/28 1600) FiO2 (%):  [35 %] 35 % (06/28 1600) Weight:  [154 lb 12.2 oz (70.2 kg)] 154 lb 12.2 oz (70.2 kg) (06/28 0035)  Physical Examination: General - Alert and interactive. HEENT - trach site clean, #6.0 xlt Cardiac - regular, no m/r/g Chest -a few scattered crackles, no wheezing,  good airflow x decreased L Base. Abd - round/soft, bs+ Ext - no edema Neuro - awake and interacting, moves all ext. Talking. Looking stronger  Intake/Output Summary (Last 24 hours) at 04/14/12 1734 Last data filed at 04/14/12 1628  Gross per 24 hour  Intake   2253 ml  Output   1451 ml  Net    802 ml   CBC  Lab 04/08/12 0942  HGB 11.5*  HCT 34.8*  WBC 9.6  PLT 225   BMET  Lab 04/13/12 0425 04/08/12 0942  NA 139 135  K 3.2* 3.7  CL 99 98  CO2 32 29  GLUCOSE 119* 94  BUN 14 12  CREATININE 0.67 0.56  CALCIUM 8.3* 8.4  MG -- 1.9  PHOS 3.1 2.1*   Lab Results  Component Value Date   ALT 10 03/29/2012   AST 13 03/29/2012   ALKPHOS 49 03/29/2012   BILITOT 0.3 03/29/2012    Intake/Output Summary (Last 24 hours) at 04/14/12 1734 Last data filed at 04/14/12 1628  Gross per 24 hour  Intake   2253 ml  Output   1451 ml  Net    802 ml   pcxr 6/23 Bibasilar airspace opacities stable on the left and worse on the  right. Left pleural effusions stable  No results found.    ASSESSMENT AND PLAN  Acute hypoxic/hypercapnic respiratory failure 2nd to delirium, aspiration pneumonia, 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  Failed extubation, and s/p trach 5/22. Xtra long 6  cuffed 6/3>>>   Plan: -Cont scheduled BDs, will continue ICS -TC as tolerated 24 h per day effective 6/21, drop FIo2, satn 88% ok -start gentle diuresis with lasix 40 to see if hypoxia improves further -Cont vibravest. -pmv as tolerated -trach change at some point    SBO s/p lap with SB resection 5/10 complicated by post-op ileus and fascial dehiscence, and return to OR 5/16. Chronic pain. Dysphagia MBS 6/26 >>primary severe cervical esophageal dysphagia with a large Zenker's Diverticulum confirmed by radiologist   Plan: - Post-op care per CCS, appreciate wound care -ct peg -D/w ENT -bates, pharyngeal surgery planned for MOnday  Hypophosphatemia/ hypokalemia - repleted  Mild  diarrhea with blood streaks: Small quantity, cdiff negative, CBC stable.  If worsens, will need to change lovenox to SCD.  He is at very high risk for TED, so would like to leave on lovenox if possible. - on 6/24: still frequent stools and is s/p rectal pouch  6/21   INFECTIOUS DZ Cultures:  BCX2 5/14 >>1/2 gram neg rod>>> 1/2 Klebsiella  UC 5/14 >>Enterococcus (sensitive to ampicillin)  Sputum 5/14>> Klebsiella pneumoniae (pan sensitive)  Wound 5/14>>Klebsiella Pneumoniae (pan sensitive) & Enterobacter Cloacae (resistant to ancef, cefoxitin)  BC X2 5/20>>>ng  Sputum 5/22>>>klebs, enterobacter  Sputum 6/7>KLEBSIELLA PNEUMONIAE and STENOTROPHOMONAS MALTOPHILIA.   Antibiotics:  vanc 5/14 >> 5/17  Zosyn 5/13 >>5/28  Vanc 6/9>>>6/11 Ceftaz 6/9 (klebsiella  Sputum and wound)> 6/21 levaquin (stenotrophomonas) 6/11>>>  (6/25)  Wound infection, aspiration pneumonia, UTI.  Plan:  -levaquin 14 days -completed 6/25, observe off abx    Hypertension-on lisinopril  / norvasc at home.  Had been on Q8 IV metoprolol.   -Changed to lopressor PO 6/17 (12.5 bid) Plan: stable    DISPO  - snf pending: likley can go on trach collar/ peg , anticipate continued improvement with PT, needs to be on 28% before dc -Updated daughter Fredonia Highland 6/26   Cyril Mourning MD. [facility]. [place] [facility] Pager [phone] If no response call [phone]    04/14/2012 5:34 PM

## 2012-04-14 NOTE — Progress Notes (Signed)
Occupational Therapy Treatment Patient Details Name: Michael Ellison MRN: [id number] DOB: [birth year] Today's Date: 04/14/2012 Time: 6295-2841 OT Time Calculation (min): 18 min  OT Assessment / Plan / Recommendation Comments on Treatment Session Pt making progress    Follow Up Recommendations  Skilled nursing facility    Barriers to Discharge       Equipment Recommendations  Defer to next venue    Recommendations for Other Services    Frequency Min 2X/week   Plan Discharge plan remains appropriate    Precautions / Restrictions Precautions Precautions: Fall Precaution Comments: trach, peg Restrictions Weight Bearing Restrictions: No   Pertinent Vitals/Pain Right shoulder minimally with theraband exercises     ADL       OT Diagnosis:    OT Problem List:   OT Treatment Interventions:     OT Goals Arm Goals Arm Goal: Additional Goal #1 - Progress: Progressing toward goals  Visit Information  Last OT Received On: 04/14/12 Assistance Needed: +1 (for arm exercises)    Subjective Data      Prior Functioning       Cognition  Overall Cognitive Status: Appears within functional limits for tasks assessed/performed Arousal/Alertness: Awake/alert Orientation Level: Appears intact for tasks assessed Behavior During Session: [facility] for tasks performed    Mobility     Exercises Other Exercises Other Exercises: Pt performed 10 reps each with level 1 theraband sitting in the recliner: Tied to handles on back 1) punch forward, 2) reach for ceiling; tied on hooks at side of recliner 1) reach across with opposite arm and pull back, 2) bicep curl.  Left theraband on hooks on side o f chair and encouarged to do the last 2 some more today and to do them over the weekend.  Balance    End of Session OT - End of Session Equipment Utilized During Treatment:  (Theraband level1) Activity Tolerance: Patient tolerated treatment well (LUE weaker than RUE) Patient left: in chair;with call  bell/phone within reach (working with SLP)  GO     Evette Georges [phone] 04/14/2012, 4:04 PM

## 2012-04-14 NOTE — Progress Notes (Signed)
Passy-Muir Speaking Valve - Treatment Patient Details  Name: Michael Ellison MRN: [id number] Date of Birth: 11/27/1945  Today's Date: 04/14/2012 Time: 4098-1191 SLP Time Calculation (min): 33 min  Past Medical History:  Past Medical History  Diagnosis Date  . Hypertension   . Cancer   . Cancer of prostate    Past Surgical History:  Past Surgical History  Procedure Date  . Hernia repair   . Prostate biopsy   . Robotic prostate surgery 2011  . Laparotomy 02/25/2012    Procedure: EXPLORATORY LAPAROTOMY;  Surgeon: [name], MD;  Location: [facility];  Service: General;  Laterality: N/A;  . Bowel resection 02/25/2012    Procedure: SMALL BOWEL RESECTION;  Surgeon: [name], MD;  Location: [facility];  Service: General;;  . Wound debridement 03/02/2012    Procedure: DEBRIDEMENT CLOSURE/ABDOMINAL WOUND;  Surgeon: Cherylynn Ridges, MD;  Location: [facility] OR;  Service: General;  Laterality: N/A;    Assessment / Plan / Recommendation Clinical Impression  Co treatment with OT, pt doing arm exercises, counting aloud with PMSV in place. At baseline without PMV pt at 97% O2 saturation. Withmax exertion pt drpooed to 92 with RR at 31. At rest pt typically at 94 with RR in 20s. Pt is able to verbalize "tightness" in chest and removes valve independently to mobilize secretions throughout the day. SLP dprovided education regarding the therapeutic value of oral expectoration of secretions with vavle in place when able. Pt states he understands and will attempt to wear the valve as much as he can tolerate through the day and during activities.     Plan  Continue with current plan of care    Follow Up Recommendations       Pertinent Vitals/Pain See above     SLP Goals Potential to Achieve Goals: Good SLP Goal #1: Pt will tolerate PMSV during all waking hours with intermittent supervision without distress.  SLP Goal #1 - Progress: Progressing toward goal SLP Goal #2: Pt will demonstrate  independence with valve placement and removal with min verbal and visual cues.  SLP Goal #2 - Progress: Progressing toward goal   PMSV Trial  PMSV was placed for: On upon arrival, observed for 25 minutes Able to redirect subglottic air through upper airway: Yes Able to Attain Phonation: Yes Voice Quality: Normal Able to Expectorate Secretions: Yes Level of Secretion Expectoration with PMSV: Oral Breath Support for Phonation: Mildly decreased Intelligibility: Intelligible Respirations During Trial: 19  (up to 30 with exertion) SpO2 During Trial: 92 % (Avg 95 ) Pulse During Trial: 89  Behavior: Alert;Cooperative   Tracheostomy Tube  Additional Tracheostomy Tube Assessment Trach Collar Period: 24 hours Secretion Description: clear thin, moderate Frequency of Tracheal Suctioning: prn Level of Secretion Expectoration: Tracheal;Oral    Vent Dependency  Vent Dependent: No FiO2 (%): 35 % Nocturnal Vent: No    Cuff Deflation Trial Tolerated Cuff Deflation: Yes Length of Time for Cuff Deflation Trial: all day Behavior: Alert;Cooperative   Raheel Kunkle, [name] 04/14/2012, 4:38 PM

## 2012-04-14 NOTE — Progress Notes (Signed)
Patient ID: Michael Ellison, male   DOB: 1946/01/16, 66 y.o.   MRN: [id number] 43 Days Post-Op  Subjective: Feels good this am, c/o of sore throat this am. Remains on trach collar. Appears to be in no acute distress.  Objective: Vital signs in last 24 hours: Temp:  [98.3 F (36.8 C)-98.6 F (37 C)] 98.3 F (36.8 C) (06/28 0412) Pulse Rate:  [86-100] 88  (06/28 0412) Resp:  [21-34] 26  (06/28 0412) BP: (104-135)/(48-94) 133/48 mmHg (06/28 0412) SpO2:  [88 %-96 %] 92 % (06/28 0412) FiO2 (%):  [35 %-40 %] 35 % (06/28 0412) Weight:  [154 lb 12.2 oz (70.2 kg)] 154 lb 12.2 oz (70.2 kg) (06/28 0035) Last BM Date: 04/13/12  Intake/Output from previous day: 06/27 0701 - 06/28 0700 In: 2388 [I.V.:3; NG/GT:1120] Out: 1473 [Urine:1450; Drains:20; Stool:3] Intake/Output this shift:    General appearance: alert, cooperative and no distress Abdomen: Wound appears to be granulating in well no drainage from wound, edges appear clean. BS are present, no bloating or tenderness, no masses palpated. He is tolerating his tube feeds well, no report of NV.  Lab Results:  No results found for this basename: WBC:2,HGB:2,HCT:2,PLT:2 in the last 72 hours BMET  [facility] 04/13/12 0425  NA 139  K 3.2*  CL 99  CO2 32  GLUCOSE 119*  BUN 14  CREATININE 0.67  CALCIUM 8.3*   PT/INR No results found for this basename: LABPROT:2,INR:2 in the last 72 hours ABG No results found for this basename: PHART:2,PCO2:2,PO2:2,HCO3:2 in the last 72 hours  Studies/Results: Dg Swallowing Func-no Report  04/12/2012  CLINICAL DATA: objectively evaluate swallow   FLUOROSCOPY FOR SWALLOWING FUNCTION STUDY:  Fluoroscopy was provided for swallowing function study, which was  administered by a speech pathologist.  Final results and recommendations  from this study are contained within the speech pathology report.      Anti-infectives: Anti-infectives     Start     Dose/Rate Route Frequency Ordered Stop   03/28/12 1500    levofloxacin (LEVAQUIN) IVPB 750 mg        750 mg 100 mL/hr over 90 Minutes Intravenous Every 24 hours 03/28/12 1348 04/11/12 1642   03/27/12 1600   vancomycin (VANCOCIN) 1,250 mg in sodium chloride 0.9 % 250 mL IVPB  Status:  Discontinued        1,250 mg 166.7 mL/hr over 90 Minutes Intravenous Every 12 hours 03/27/12 0541 03/28/12 1348   03/25/12 0600   vancomycin (VANCOCIN) 750 mg in sodium chloride 0.9 % 150 mL IVPB  Status:  Discontinued        750 mg 150 mL/hr over 60 Minutes Intravenous Every 12 hours 03/24/12 1423 03/27/12 0541   03/24/12 1600   vancomycin (VANCOCIN) 1,500 mg in sodium chloride 0.9 % 500 mL IVPB        1,500 mg 250 mL/hr over 120 Minutes Intravenous  Once 03/24/12 1423 03/24/12 1856   03/24/12 1530   cefTAZidime (FORTAZ) 1 g in dextrose 5 % 50 mL IVPB  Status:  Discontinued        1 g 100 mL/hr over 30 Minutes Intravenous Every 8 hours 03/24/12 1423 04/07/12 1005   03/24/12 1400   vancomycin (VANCOCIN) IVPB 1000 mg/200 mL premix  Status:  Discontinued        1,000 mg 200 mL/hr over 60 Minutes Intravenous Every 12 hours 03/24/12 1353 03/24/12 1400   03/03/12 1800   vancomycin (VANCOCIN) 1,750 mg in sodium chloride 0.9 % 500 mL  IVPB  Status:  Discontinued        1,750 mg 250 mL/hr over 120 Minutes Intravenous Every 12 hours 03/03/12 0628 03/03/12 0918   02/29/12 1900   micafungin (MYCAMINE) 100 mg in sodium chloride 0.9 % 100 mL IVPB  Status:  Discontinued        100 mg 100 mL/hr over 1 Hours Intravenous Every 24 hours 02/29/12 1754 03/01/12 0933   02/29/12 1730   vancomycin (VANCOCIN) 1,250 mg in sodium chloride 0.9 % 250 mL IVPB  Status:  Discontinued        1,250 mg 166.7 mL/hr over 90 Minutes Intravenous Every 12 hours 02/29/12 1644 03/03/12 0628   02/29/12 1715   vancomycin (VANCOCIN) IVPB 1000 mg/200 mL premix  Status:  Discontinued        1,000 mg 200 mL/hr over 60 Minutes Intravenous  Once 02/29/12 1637 02/29/12 1647   02/29/12 1645    piperacillin-tazobactam (ZOSYN) IVPB 3.375 g  Status:  Discontinued        3.375 g 100 mL/hr over 30 Minutes Intravenous  Once 02/29/12 1637 02/29/12 1641   02/28/12 1800   piperacillin-tazobactam (ZOSYN) IVPB 3.375 g  Status:  Discontinued        3.375 g 12.5 mL/hr over 240 Minutes Intravenous 4 times per day 02/28/12 1454 02/28/12 1510   02/28/12 1800   piperacillin-tazobactam (ZOSYN) IVPB 3.375 g  Status:  Discontinued        3.375 g 12.5 mL/hr over 240 Minutes Intravenous Every 8 hours 02/28/12 1510 03/14/12 1700   02/25/12 1109   dextrose 5 % with cefOXitin (MEFOXIN) ADS Med     Comments: HOLLIE, CHRISTINA: cabinet override         02/25/12 1109 02/25/12 2314   02/25/12 1100   cefOXitin (MEFOXIN) 1 g in dextrose 5 % 50 mL IVPB  Status:  Discontinued        1 g 100 mL/hr over 30 Minutes Intravenous 60 min pre-op 02/25/12 1100 02/25/12 1542          Assessment/Plan: s/p Procedure(s) (LRB): DEBRIDEMENT CLOSURE/ABDOMINAL WOUND (N/A)  Continue daily wound dressing changes with Aquacel  LOS: 50 days    Simrah Chatham 04/14/2012

## 2012-04-14 NOTE — Progress Notes (Addendum)
Wound OK Will see again on Monday.  Zencker's diverticulum per ENT  Wilmon Arms. Corliss Skains, MD, [facility]  04/14/2012 7:59 AM

## 2012-04-14 NOTE — Plan of Care (Signed)
Problem: Discharge Progression Outcomes Goal: Tolerating diet Outcome: Completed/Met Date Met:  04/14/12 Tolerating Tube Feed  Jevity 1.2

## 2012-04-14 NOTE — Progress Notes (Signed)
Nutrition Follow-up  Intervention:   1. Current enteral nutrition regimen is providing pt with 100% of estimated protein and kcal needs. Weight has remained relatively stable x 2 weeks. 2. RD to continue to follow nutrition care plan  MBSS completed 6/26, per SLP, pt with decrease in functional reserve and will need referral to ENT for assessment; pt to remain NPO. RN reports that pt is tolerating TF regimen well. Continues with diarrhea.  Diet Order:  NPO  TF: Jevity 1.2 at 55 ml/hr with 30 ml Prostat via tube BID, provides: 1784 kcal, 104 grams protein, 1065 ml free water  Free water of 200 ml q 4 hours; provides an additional 1200 ml daily  Meds: Scheduled Meds:   . albuterol  2.5 mg Nebulization QID  . antiseptic oral rinse  15 mL Mouth Rinse QID  . budesonide  0.5 mg Nebulization BID  . chlorhexidine  15 mL Mouth Rinse BID  . enoxaparin  40 mg Subcutaneous Q24H  . escitalopram  10 mg Oral Daily  . feeding supplement  30 mL Per Tube BID  . fentaNYL  100 mcg Intravenous Once  . free water  200 mL Per Tube Q4H  . metoprolol tartrate  12.5 mg Per Tube BID  . pantoprazole sodium  40 mg Per Tube QHS  . potassium chloride  40 mEq Per Tube Once  . sodium chloride  3 mL Intravenous Q12H   Continuous Infusions:   . sodium chloride Stopped (04/10/12 1800)  . feeding supplement (JEVITY 1.2 CAL) 1,000 mL (04/13/12 1238)   PRN Meds:.albuterol, ALPRAZolam, bisacodyl, fentaNYL, hydrALAZINE, phenol, silver nitrate applicators  Labs:  CMP     Component Value Date/Time   NA 139 04/13/2012 0425   K 3.2* 04/13/2012 0425   CL 99 04/13/2012 0425   CO2 32 04/13/2012 0425   GLUCOSE 119* 04/13/2012 0425   BUN 14 04/13/2012 0425   CREATININE 0.67 04/13/2012 0425   CALCIUM 8.3* 04/13/2012 0425   PROT 6.1 03/29/2012 0420   ALBUMIN 2.0* 04/13/2012 0425   AST 13 03/29/2012 0420   ALT 10 03/29/2012 0420   ALKPHOS 49 03/29/2012 0420   BILITOT 0.3 03/29/2012 0420   GFRNONAA >90 04/13/2012 0425   GFRAA >90  04/13/2012 0425     Intake/Output Summary (Last 24 hours) at 04/14/12 0907 Last data filed at 04/14/12 0800  Gross per 24 hour  Intake   2333 ml  Output   1773 ml  Net    560 ml   Ht: 6\' 2"  (1.88 m)  Weight Status:  70.2 kg - stable  Re-estimated needs:  1750-1850 kcals, 90 - 110 grams protein daily  Nutrition Dx:  Inadequate oral intake - ongoing  Goal:  Intake to meet 90-100% of estimated nutrition needs. Met.  Monitor:  Weights, labs, PO intake, I/O's, vent use  Jarold Motto MS, [place], LDN Pager#: [phone] After hours pager#: [phone]

## 2012-04-15 LAB — BASIC METABOLIC PANEL
CO2: 27 mEq/L (ref 19–32)
Chloride: 102 mEq/L (ref 96–112)
Potassium: 3.7 mEq/L (ref 3.5–5.1)
Sodium: 138 mEq/L (ref 135–145)

## 2012-04-15 NOTE — Progress Notes (Signed)
Name: Michael Ellison MRN: [id number] DOB: December 31, 1945    LOS: 51 Date of admit 02/24/2012  8:34 AM  PULMONARY / CRITICAL CARE MEDICINE  Pt Profile:   66 y/o male smoker admitted to [facility] 02/24/2012 with n/v and abdominal pain due to SBO due to adhesions.  Had ex lap with SB resection 5/10.  Developed delirium post-op.  Transferred to [facility] 5/14 for respiratory failure requiring intubation.  Developed abdominal fascial dehiscence and taken back to OR 5/16.  Failed extubation, and required tracheostomy 5/22. Weaned to ATC, MBS showed zenkers' diverticulum. PMHx ETOH, HTN, Prostate cancer  Interval hx:  5/16: OR for fascial dehiscence repair- closed with wound vac 5/18- failed weaning 5/19- extubated 5/21- re-intubated for hypercarbia 5/22-improved co2 and neurostatus, pos almost 2 liters 5/22 SVT>>resolved spontaneously 5/30 -off vent for 24 hours, planned Panda placement 6/1 - up in chair, improved. 6/3 - desat, increased WOB, vent 6/3 - leak trach, improved with extra long 6/5 - FEES eval: failed, still showing evidence of aspiration.  6/16 - Few liquid stools with streaks of blood,  H/h stable.  (has been having mucoid / bloody streaked stool -surgeons aware) 6/18 Perc gastrostomy per IR 6/21 started 24 h T collar 6/25 - still on TC. Did PMV  Subjective: Comfortable on T collar overnight; no changes overnight .  on 35% trach collar Speech ok.Feels well. Secretions +  Vital Signs: Temp:  [98.3 F (36.8 C)-98.7 F (37.1 C)] 98.3 F (36.8 C) (06/29 0834) Pulse Rate:  [83-97] 90  (06/29 0900) Resp:  [21-30] 24  (06/29 0900) BP: (102-121)/(70-87) 112/75 mmHg (06/29 0834) SpO2:  [91 %-96 %] 92 % (06/29 0834) FiO2 (%):  [35 %] 35 % (06/29 0800)  Physical Examination: General - Alert and interactive. HEENT - trach site clean, #6.0 xlt Cardiac - regular, no m/r/g Chest -a few scattered crackles, no wheezing, good airflow x decreased L Base. Abd - round/soft, bs+ Ext - no  edema Neuro - awake and interacting, moves all ext. Talking. Looking stronger  Intake/Output Summary (Last 24 hours) at 04/15/12 0954 Last data filed at 04/15/12 0900  Gross per 24 hour  Intake   2350 ml  Output   1350 ml  Net   1000 ml   CBC No results found for this basename: HGB:3,HCT:3,WBC:3,PLT:3 in the last 168 hours BMET  Lab 04/15/12 0339 04/13/12 0425  NA 138 139  K 3.7 3.2*  CL 102 99  CO2 27 32  GLUCOSE 94 119*  BUN 13 14  CREATININE 0.52 0.67  CALCIUM 8.5 8.3*  MG -- --  PHOS -- 3.1   Lab Results  Component Value Date   ALT 10 03/29/2012   AST 13 03/29/2012   ALKPHOS 49 03/29/2012   BILITOT 0.3 03/29/2012    Intake/Output Summary (Last 24 hours) at 04/15/12 0954 Last data filed at 04/15/12 0900  Gross per 24 hour  Intake   2350 ml  Output   1350 ml  Net   1000 ml   pcxr 6/23 Bibasilar airspace opacities stable on the left and worse on the  right. Left pleural effusions stable  No results found.    ASSESSMENT AND PLAN  Acute hypoxic/hypercapnic respiratory failure 2nd to delirium, aspiration pneumonia, 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  Failed extubation, and s/p trach 5/22. Xtra long 6 cuffed 6/3>>>   Plan: -Cont scheduled BDs, will continue ICS -TC as tolerated 24 h per day effective 6/21, drop FIo2, satn 88%  ok -start gentle diuresis with lasix 40 to see if hypoxia improves further -Cont vibravest. -pmv as tolerated -XLT trach can be changed when in OR with ENT    SBO s/p lap with SB resection 5/10 complicated by post-op ileus and fascial dehiscence, and return to OR 5/16. Chronic pain. Dysphagia MBS 6/26 >>primary severe cervical esophageal dysphagia with a large Zenker's Diverticulum confirmed by radiologist   Plan: - Post-op care per CCS, appreciate wound care -ct peg -D/w ENT -bates, pharyngeal surgery planned for MOnday  Hypophosphatemia/ hypokalemia - repleted  Mild diarrhea with blood streaks: Small  quantity, cdiff negative, CBC stable.  If worsens, will need to change lovenox to SCD.  He is at very high risk for TED, so would like to leave on lovenox if possible. -  rectal pouch  6/21   INFECTIOUS DZ Cultures:  BCX2 5/14 >>1/2 gram neg rod>>> 1/2 Klebsiella  UC 5/14 >>Enterococcus (sensitive to ampicillin)  Sputum 5/14>> Klebsiella pneumoniae (pan sensitive)  Wound 5/14>>Klebsiella Pneumoniae (pan sensitive) & Enterobacter Cloacae (resistant to ancef, cefoxitin)  BC X2 5/20>>>ng  Sputum 5/22>>>klebs, enterobacter  Sputum 6/7>KLEBSIELLA PNEUMONIAE and STENOTROPHOMONAS MALTOPHILIA.   Antibiotics:  vanc 5/14 >> 5/17  Zosyn 5/13 >>5/28  Vanc 6/9>>>6/11 Ceftaz 6/9 (klebsiella  Sputum and wound)> 6/21 levaquin (stenotrophomonas) 6/11>>>  (6/25)  Wound infection, aspiration pneumonia, UTI.  Plan:  -levaquin 14 days -completed 6/25, observe off abx    Hypertension-on lisinopril  / norvasc at home.   -Changed to lopressor PO 6/17 (12.5 bid) Plan: stable    DISPO  - snf pending: likley can go on trach collar/ peg , anticipate continued improvement with PT, needs to be on 28% before dc -Updated daughter Fredonia Highland 6/26- dr bates to discuss surgery with her   Cyril Mourning MD. [facility]. [place] [facility] Pager [phone] If no response call [phone]    04/15/2012 9:54 AM

## 2012-04-16 ENCOUNTER — Inpatient Hospital Stay (HOSPITAL_COMMUNITY): Payer: Medicare Other

## 2012-04-16 DIAGNOSIS — R1310 Dysphagia, unspecified: Secondary | ICD-10-CM | POA: Diagnosis not present

## 2012-04-16 MED ORDER — FREE WATER
200.0000 mL | Freq: Three times a day (TID) | Status: DC
  Administered 2012-04-16 – 2012-04-24 (×23): 200 mL

## 2012-04-16 NOTE — Progress Notes (Signed)
Name: Michael Ellison MRN: [id number] DOB: [birth year]    LOS: 52 Date of admit 02/24/2012  8:34 AM  PULMONARY / CRITICAL CARE MEDICINE  Pt Profile:   66 y/o male smoker admitted to [facility] 02/24/2012 with n/v and abdominal pain due to SBO due to adhesions.  Had ex lap with SB resection 5/10.  Developed delirium post-op.  Transferred to [facility] 5/14 for respiratory failure requiring intubation.  Developed abdominal fascial dehiscence and taken back to OR 5/16.  Failed extubation, and required tracheostomy 5/22. Weaned to ATC, MBS showed zenkers' diverticulum. PMHx ETOH, HTN, Prostate cancer  Interval hx:  5/16: OR for fascial dehiscence repair- closed with wound vac 5/18- failed weaning 5/19- extubated 5/21- re-intubated for hypercarbia 5/22-improved co2 and neurostatus, pos almost 2 liters 5/22 SVT>>resolved spontaneously 5/30 -off vent for 24 hours, planned Panda placement 6/1 - up in chair, improved. 6/3 - desat, increased WOB, vent 6/3 - leak trach, improved with extra long 6/5 - FEES eval: failed, still showing evidence of aspiration.  6/16 - Few liquid stools with streaks of blood,  H/h stable.  (has been having mucoid / bloody streaked stool -surgeons aware) 6/18 Perc gastrostomy per IR 6/21 started 24 h T collar 6/25 - still on TC. Did PMV  Subjective: Comfortable on T collar overnight; no changes overnight .  satn 90% on 35% trach collar Speech ok.Feels well. Secretions +  Vital Signs: Temp:  [98 F (36.7 C)-98.5 F (36.9 C)] 98.2 F (36.8 C) (06/30 0826) Pulse Rate:  [81-94] 92  (06/30 0826) Resp:  [22-31] 28  (06/30 0826) BP: (106-131)/(71-76) 115/76 mmHg (06/30 0826) SpO2:  [90 %-96 %] 90 % (06/30 0826) FiO2 (%):  [35 %] 35 % (06/30 4098)  Physical Examination: General - Alert and interactive. HEENT - trach site clean, #6.0 xlt Cardiac - regular, no m/r/g Chest -a few scattered crackles, no wheezing, good airflow x decreased L Base. Abd - round/soft, bs+ Ext - no  edema Neuro - awake and interacting, moves all ext. Talking. Looking stronger  Intake/Output Summary (Last 24 hours) at 04/16/12 1005 Last data filed at 04/16/12 0500  Gross per 24 hour  Intake   2126 ml  Output    600 ml  Net   1526 ml   CBC No results found for this basename: HGB:3,HCT:3,WBC:3,PLT:3 in the last 168 hours BMET  Lab 04/15/12 0339 04/13/12 0425  NA 138 139  K 3.7 3.2*  CL 102 99  CO2 27 32  GLUCOSE 94 119*  BUN 13 14  CREATININE 0.52 0.67  CALCIUM 8.5 8.3*  MG -- --  PHOS -- 3.1   Lab Results  Component Value Date   ALT 10 03/29/2012   AST 13 03/29/2012   ALKPHOS 49 03/29/2012   BILITOT 0.3 03/29/2012    Intake/Output Summary (Last 24 hours) at 04/16/12 1005 Last data filed at 04/16/12 0500  Gross per 24 hour  Intake   2126 ml  Output    600 ml  Net   1526 ml   pcxr 6/30 Bibasilar atx   - improved   ASSESSMENT AND PLAN  Acute hypoxic/hypercapnic respiratory failure 2nd to delirium, aspiration pneumonia, 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  Failed extubation, and s/p trach 5/22. Xtra long 6 cuffed 6/3>>>   Plan: -Cont scheduled BDs, continue ICS -TC as tolerated 24 h per day effective 6/21, drop FIo2, satn 88% ok -start gentle diuresis with lasix 40 to see if hypoxia improves further -  Cont vibravest. -pmv as tolerated -XLT trach can be changed when in OR with ENT    SBO s/p lap with SB resection 5/10 complicated by post-op ileus and fascial dehiscence, and return to OR 5/16. Chronic pain. Dysphagia MBS 6/26 >>primary severe cervical esophageal dysphagia with a large Zenker's Diverticulum confirmed by radiologist   Plan: - Post-op care per CCS, appreciate wound care -ct peg -D/w ENT -bates, pharyngeal surgery for zenker's planned for MOnday  Hypophosphatemia/ hypokalemia - repleted  Mild diarrhea with blood streaks: Small quantity, cdiff negative, CBC stable.  If worsens, will need to change lovenox to SCD.  -   rectal pouch  6/21   INFECTIOUS DZ Cultures:  BCX2 5/14 >>1/2 gram neg rod>>> 1/2 Klebsiella  UC 5/14 >>Enterococcus (sensitive to ampicillin)  Sputum 5/14>> Klebsiella pneumoniae (pan sensitive)  Wound 5/14>>Klebsiella Pneumoniae (pan sensitive) & Enterobacter Cloacae (resistant to ancef, cefoxitin)  BC X2 5/20>>>ng  Sputum 5/22>>>klebs, enterobacter  Sputum 6/7>KLEBSIELLA PNEUMONIAE and STENOTROPHOMONAS MALTOPHILIA.   Antibiotics:  vanc 5/14 >> 5/17  Zosyn 5/13 >>5/28  Vanc 6/9>>>6/11 Ceftaz 6/9 (klebsiella  Sputum and wound)> 6/21 levaquin (stenotrophomonas) 6/11>>>  (6/25)  Wound infection, aspiration pneumonia, UTI.  Plan:  -levaquin 14 days -completed 6/25, observe off abx    Hypertension-on lisinopril  / norvasc at home.   -Changed to lopressor PO 6/17 (12.5 bid) Plan: stable    DISPO  - snf pending: likley can go on trach collar/ peg , anticipate continued improvement with PT, needs to be on 28% before dc -Updated daughter 6/30- at bedside   Cyril Mourning MD. FCCP. [facility] Pager [phone] If no response call [phone]    04/16/2012 10:05 AM

## 2012-04-17 ENCOUNTER — Encounter (HOSPITAL_COMMUNITY)
Admission: EM | Disposition: A | Discharge status: Skilled Nursing Facility | Payer: Self-pay | Source: Ambulatory Visit | Attending: Pulmonary Disease

## 2012-04-17 ENCOUNTER — Inpatient Hospital Stay (HOSPITAL_COMMUNITY): Payer: Medicare Other | Admitting: Anesthesiology

## 2012-04-17 ENCOUNTER — Encounter (HOSPITAL_COMMUNITY): Payer: Self-pay | Admitting: Anesthesiology

## 2012-04-17 LAB — BASIC METABOLIC PANEL
CO2: 28 mEq/L (ref 19–32)
Chloride: 99 mEq/L (ref 96–112)
GFR calc non Af Amer: >90 mL/min (ref >90)
Glucose, Bld: 85 mg/dL (ref 70–99)
Potassium: 3.9 mEq/L (ref 3.5–5.1)
Sodium: 135 mEq/L (ref 135–145)

## 2012-04-17 LAB — CBC
Hemoglobin: 11.9 g/dL — ABNORMAL LOW (ref 13.0–17.0)
MCH: 26.8 pg (ref 26.0–34.0)
MCV: 82 fL (ref 78.0–100.0)
RBC: 4.44 MIL/uL (ref 4.22–5.81)

## 2012-04-17 SURGERY — ZENKER'S DIVERTICULECTOMY ENDOSCOPIC
Anesthesia: General | Site: Esophagus | Wound class: Clean Contaminated

## 2012-04-17 MED ORDER — GLYCOPYRROLATE 0.2 MG/ML IJ SOLN
INTRAMUSCULAR | Status: DC | PRN
  Administered 2012-04-17: .5 mg via INTRAVENOUS

## 2012-04-17 MED ORDER — PROPOFOL 10 MG/ML IV EMUL
INTRAVENOUS | Status: DC | PRN
  Administered 2012-04-17: 140 mg via INTRAVENOUS

## 2012-04-17 MED ORDER — HYDROCODONE-ACETAMINOPHEN 7.5-500 MG/15ML PO SOLN
10.0000 mL | ORAL | Status: DC | PRN
  Administered 2012-04-19: 10 mL
  Filled 2012-04-17 (×2): qty 15

## 2012-04-17 MED ORDER — FENTANYL CITRATE 0.05 MG/ML IJ SOLN
INTRAMUSCULAR | Status: DC | PRN
  Administered 2012-04-17: 100 ug via INTRAVENOUS
  Administered 2012-04-17: 50 ug via INTRAVENOUS

## 2012-04-17 MED ORDER — SODIUM CHLORIDE 0.9 % IV SOLN
INTRAVENOUS | Status: DC
  Administered 2012-04-17 – 2012-04-18 (×3): via INTRAVENOUS
  Administered 2012-04-20: 20 mL/h via INTRAVENOUS

## 2012-04-17 MED ORDER — LIDOCAINE HCL (CARDIAC) 20 MG/ML IV SOLN
INTRAVENOUS | Status: DC | PRN
  Administered 2012-04-17: 60 mg via INTRAVENOUS

## 2012-04-17 MED ORDER — LACTATED RINGERS IV SOLN
INTRAVENOUS | Status: DC
  Administered 2012-04-17: 12:00:00 via INTRAVENOUS

## 2012-04-17 MED ORDER — 0.9 % SODIUM CHLORIDE (POUR BTL) OPTIME
TOPICAL | Status: DC | PRN
  Administered 2012-04-17: 1000 mL

## 2012-04-17 MED ORDER — ROCURONIUM BROMIDE 100 MG/10ML IV SOLN
INTRAVENOUS | Status: DC | PRN
  Administered 2012-04-17: 10 mg via INTRAVENOUS
  Administered 2012-04-17: 20 mg via INTRAVENOUS

## 2012-04-17 MED ORDER — MIDAZOLAM HCL 5 MG/5ML IJ SOLN
INTRAMUSCULAR | Status: DC | PRN
  Administered 2012-04-17 (×2): 2 mg via INTRAVENOUS

## 2012-04-17 MED ORDER — ONDANSETRON HCL 4 MG/2ML IJ SOLN: 4.0000 mg | Freq: Four times a day (QID) | INTRAMUSCULAR | Status: DC | PRN

## 2012-04-17 MED ORDER — MORPHINE SULFATE 2 MG/ML IJ SOLN
2.0000 mg | INTRAMUSCULAR | Status: DC | PRN
  Administered 2012-04-17 – 2012-04-18 (×5): 2 mg via INTRAVENOUS
  Filled 2012-04-17 (×5): qty 1

## 2012-04-17 MED ORDER — NEOSTIGMINE METHYLSULFATE 1 MG/ML IJ SOLN
INTRAMUSCULAR | Status: DC | PRN
  Administered 2012-04-17: 3 mg via INTRAVENOUS

## 2012-04-17 MED ORDER — ONDANSETRON HCL 4 MG/2ML IJ SOLN
INTRAMUSCULAR | Status: DC | PRN
  Administered 2012-04-17: 4 mg via INTRAVENOUS

## 2012-04-17 MED ORDER — HYDROMORPHONE HCL PF 1 MG/ML IJ SOLN
0.2500 mg | INTRAMUSCULAR | Status: DC | PRN
  Administered 2012-04-17 (×2): 0.5 mg via INTRAVENOUS

## 2012-04-17 MED ORDER — PHENYLEPHRINE HCL 10 MG/ML IJ SOLN
INTRAMUSCULAR | Status: DC | PRN
  Administered 2012-04-17: 80 ug via INTRAVENOUS
  Administered 2012-04-17: 40 ug via INTRAVENOUS

## 2012-04-17 SURGICAL SUPPLY — 27 items
CATH ROBINSON RED A/P 18FR (CATHETERS) ×2 IMPLANT
CLOTH BEACON ORANGE TIMEOUT ST (SAFETY) ×2 IMPLANT
COVER LIGHT HANDLE  DEROYL (MISCELLANEOUS) ×1 IMPLANT
COVER TABLE BACK 60X90 (DRAPES) ×2 IMPLANT
CRADLE DONUT ADULT HEAD (MISCELLANEOUS) ×2 IMPLANT
CUTTER LINEAR ENDO 35 ETS (STAPLE) ×2 IMPLANT
DRAPE PROXIMA HALF (DRAPES) ×2 IMPLANT
GLOVE BIO SURGEON STRL SZ7.5 (GLOVE) ×2 IMPLANT
GLOVE BIOGEL PI IND STRL 7.5 (GLOVE) IMPLANT
GLOVE BIOGEL PI INDICATOR 7.5 (GLOVE) ×2
GLOVE ECLIPSE 6.5 STRL STRAW (GLOVE) ×1 IMPLANT
GLOVE SURG SS PI 6.5 STRL IVOR (GLOVE) ×2 IMPLANT
GLOVE SURG SS PI 7.0 STRL IVOR (GLOVE) ×2 IMPLANT
GOWN BRE IMP SLV AUR LG STRL (GOWN DISPOSABLE) ×4 IMPLANT
GUARD TEETH (MISCELLANEOUS) ×2 IMPLANT
KIT ROOM TURNOVER OR (KITS) ×2 IMPLANT
NS IRRIG 1000ML POUR BTL (IV SOLUTION) ×2 IMPLANT
PAD ARMBOARD 7.5X6 YLW CONV (MISCELLANEOUS) ×2 IMPLANT
RELOAD CUTTER ETS 35MM STAND (ENDOMECHANICALS) ×2 IMPLANT
SOLUTION ANTI FOG 6CC (MISCELLANEOUS) ×2 IMPLANT
SPONGE GAUZE 4X4 12PLY (GAUZE/BANDAGES/DRESSINGS) ×2 IMPLANT
SURGILUBE 3G PEEL PACK STRL (MISCELLANEOUS) ×2 IMPLANT
SUT SILK 2 0 SH (SUTURE) ×1 IMPLANT
SUT VIC AB 2-0 UR6 27 (SUTURE) ×2 IMPLANT
TOWEL OR 17X24 6PK STRL BLUE (TOWEL DISPOSABLE) ×2 IMPLANT
TUBE CONNECTING 12X1/4 (SUCTIONS) ×2 IMPLANT
WATER STERILE IRR 1000ML POUR (IV SOLUTION) ×2 IMPLANT

## 2012-04-17 NOTE — Progress Notes (Signed)
Name: Michael Ellison MRN: [id number] DOB: [birth year]    LOS: 53 Date of admit 02/24/2012  8:34 AM  PULMONARY / CRITICAL CARE MEDICINE  Pt Profile:   66 y/o male smoker admitted to [facility] 02/24/2012 with n/v and abdominal pain due to SBO due to adhesions.  Had ex lap with SB resection 5/10.  Developed delirium post-op.  Transferred to [facility] 5/14 for respiratory failure requiring intubation.  Developed abdominal fascial dehiscence and taken back to OR 5/16.  Failed extubation, and required tracheostomy 5/22. Weaned to ATC, MBS showed zenkers' diverticulum. PMHx ETOH, HTN, Prostate cancer  Interval hx:  5/16: OR for fascial dehiscence repair- closed with wound vac 5/18- failed weaning 5/19- extubated 5/21- re-intubated for hypercarbia 5/22-improved co2 and neurostatus, pos almost 2 liters 5/22 SVT>>resolved spontaneously 5/30 -off vent for 24 hours, planned Panda placement 6/1 - up in chair, improved. 6/3 - desat, increased WOB, vent 6/3 - leak trach, improved with extra long 6/5 - FEES eval: failed, still showing evidence of aspiration.  6/16 - Few liquid stools with streaks of blood,  H/h stable.  (has been having mucoid / bloody streaked stool -surgeons aware) 6/18 Perc gastrostomy per IR 6/21 started 24 h T collar 6/25 - still on TC. Did PMV  Subjective: Comfortable on T collar overnight; no changes overnight .  satn 93% on 35% trach collar Speech ok.Feels well. Secretions mild +  Vital Signs: Temp:  [97.9 F (36.6 C)-98.5 F (36.9 C)] 98.5 F (36.9 C) (07/01 0810) Pulse Rate:  [80-93] 84  (07/01 1117) Resp:  [21-29] 26  (07/01 1117) BP: (100-113)/(66-88) 112/77 mmHg (07/01 1117) SpO2:  [89 %-94 %] 93 % (07/01 1117) FiO2 (%):  [35 %] 35 % (07/01 1117) Weight:  [67 kg (147 lb 11.3 oz)] 67 kg (147 lb 11.3 oz) (07/01 0407)  Physical Examination: General - Alert and interactive. HEENT - trach site clean, #6.0 xlt Cardiac - regular, no m/r/g Chest -a few scattered crackles, no  wheezing, good airflow x decreased L Base. Abd - round/soft, bs+ Ext - no edema Neuro - awake and interacting, moves all ext. Talking. Looking stronger  Intake/Output Summary (Last 24 hours) at 04/17/12 1232 Last data filed at 04/17/12 1000  Gross per 24 hour  Intake    610 ml  Output    750 ml  Net   -140 ml   CBC  Lab 04/17/12 0346  HGB 11.9*  HCT 36.4*  WBC 8.8  PLT 258   BMET  Lab 04/17/12 0346 04/15/12 0339 04/13/12 0425  NA 135 138 139  K 3.9 3.7 --  CL 99 102 99  CO2 28 27 32  GLUCOSE 85 94 119*  BUN 14 13 14   CREATININE 0.59 0.52 0.67  CALCIUM 8.9 8.5 8.3*  MG -- -- --  PHOS -- -- 3.1   Lab Results  Component Value Date   ALT 10 03/29/2012   AST 13 03/29/2012   ALKPHOS 49 03/29/2012   BILITOT 0.3 03/29/2012    Intake/Output Summary (Last 24 hours) at 04/17/12 1232 Last data filed at 04/17/12 1000  Gross per 24 hour  Intake    610 ml  Output    750 ml  Net   -140 ml   pcxr 6/30 Bibasilar atx   - improved   ASSESSMENT AND PLAN  Acute hypoxic/hypercapnic respiratory failure 2nd to delirium, aspiration pneumonia, 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  Failed extubation, and s/p trach 5/22. Xtra long  6 cuffed 6/3>>>   Plan: -Cont scheduled BDs, continue ICS -TC as tolerated 24 h per daysince  6/21, awaiting drop in FIO2 to 28% before tr to SNF -6/30 started gentle diuresis with lasix 40 to see if hypoxia improves further (>40L POS) -Cont vibravest. -pmv as tolerated -XLT trach can be changed when in OR with ENT    SBO s/p lap with SB resection 5/10 complicated by post-op ileus and fascial dehiscence, and return to OR 5/16. Chronic pain. Dysphagia MBS 6/26 >>primary severe cervical esophageal dysphagia with a large Zenker's Diverticulum confirmed by radiologist   Plan: - Post-op care per CCS, appreciate wound care -ct peg - pharyngeal surgery for zenker's today Jenne Pane)  Hypophosphatemia/ hypokalemia - repleted  Mild  diarrhea with blood streaks: Small quantity, cdiff negative, Hb stable.  If worsens, will need to change lovenox to SCD.  -  rectal pouch  6/21   INFECTIOUS DZ Cultures:  BCX2 5/14 >>1/2 gram neg rod>>> 1/2 Klebsiella  UC 5/14 >>Enterococcus (sensitive to ampicillin)  Sputum 5/14>> Klebsiella pneumoniae (pan sensitive)  Wound 5/14>>Klebsiella Pneumoniae (pan sensitive) & Enterobacter Cloacae (resistant to ancef, cefoxitin)  BC X2 5/20>>>ng  Sputum 5/22>>>klebs, enterobacter  Sputum 6/7>KLEBSIELLA PNEUMONIAE and STENOTROPHOMONAS MALTOPHILIA.   Antibiotics:  vanc 5/14 >> 5/17  Zosyn 5/13 >>5/28  Vanc 6/9>>>6/11 Ceftaz 6/9 (klebsiella  Sputum and wound)> 6/21 levaquin (stenotrophomonas) 6/11>>>  (6/25)  Wound infection, aspiration pneumonia, UTI.  Plan:  -levaquin 14 days -completed 6/25, off abx    Hypertension-on lisinopril  / norvasc at home.   -Changed to lopressor PO 6/17 (12.5 bid) Plan: stable    DISPO  - snf pending: likley can go on trach collar/ peg , anticipate continued improvement with PT, needs to be on 28% before dc -Updated daughter 6/30- at bedside   Cyril Mourning MD. FCCP. [place] Pager [phone] If no response call [phone]    04/17/2012 12:32 PM

## 2012-04-17 NOTE — Progress Notes (Signed)
Continue wound care.  Michael Ellison. [name], MD, FACS [phone] [facility]

## 2012-04-17 NOTE — Transfer of Care (Signed)
Immediate Anesthesia Transfer of Care Note  Patient: Michael Ellison  Procedure(s) Performed: Procedure(s) (LRB): ZENKER'S DIVERTICULECTOMY ENDOSCOPIC (N/A)  Patient Location: PACU  Anesthesia Type: General  Level of Consciousness: patient cooperative, lethargic and responds to stimulation  Airway & Oxygen Therapy: Patient Spontanous Breathing and Patient connected to tracheostomy mask oxygen  Post-op Assessment: Report given to PACU RN  Post vital signs: Reviewed and stable  Complications: No apparent anesthesia complications

## 2012-04-17 NOTE — Preoperative (Addendum)
Beta Blockers   Reason not to administer Beta Blockers:Not Applicable, last dose 04/17/12 at 0944

## 2012-04-17 NOTE — Consult Note (Signed)
Reason for Consult:Zenker's diverticulum Referring Physician: CCM  Michael Ellison is an 66 y.o. male.  HPI: 66 year old male with long hospitalization related to small bowel obstruction and subsequent complications.  Required tracheostomy and PEG placement as part of his management.  Recently, speech pathology performed a modified barium swallow to assess his swallow and a large Zenker's diverticulum was identified that is felt to be contributing significantly to his swallowing difficulty.  He recalls having some degree of dysphagia before this hospitalization, but not as bad.  Symptom is severe and unchanged.  Nothing aggravates or relieves it.  Past Medical History  Diagnosis Date  . Hypertension   . Cancer   . Cancer of prostate     Past Surgical History  Procedure Date  . Hernia repair   . Prostate biopsy   . Robotic prostate surgery 2011  . Laparotomy 02/25/2012    Procedure: EXPLORATORY LAPAROTOMY;  Surgeon: Michael Bering, MD;  Location: [facility];  Service: General;  Laterality: N/A;  . Bowel resection 02/25/2012    Procedure: SMALL BOWEL RESECTION;  Surgeon: Michael Bering, MD;  Location: [facility];  Service: General;;  . Wound debridement 03/02/2012    Procedure: DEBRIDEMENT CLOSURE/ABDOMINAL WOUND;  Surgeon: Michael Ridges, MD;  Location: [facility] OR;  Service: General;  Laterality: N/A;    Family History  Problem Relation Age of Onset  . Pseudochol deficiency Neg Hx   . Anesthesia problems Neg Hx   . Hypotension Neg Hx   . Malignant hyperthermia Neg Hx     Social History:  reports that he has been smoking.  He does not have any smokeless tobacco history on file. He reports that he drinks alcohol. He reports that he does not use illicit drugs.  Allergies:  Allergies  Allergen Reactions  . Lorazepam Other (See Comments)    Severe agitated delirium. Tolerates midazolam and other benzo's    Medications: I have reviewed the patient's current medications.  Results for orders  placed during the hospital encounter of 02/24/12 (from the past 48 hour(s))  BASIC METABOLIC PANEL     Status: Normal   Collection Time   04/17/12  3:46 AM      Component Value Range Comment   Sodium 135  135 - 145 mEq/L    Potassium 3.9  3.5 - 5.1 mEq/L    Chloride 99  96 - 112 mEq/L    CO2 28  19 - 32 mEq/L    Glucose, Bld 85  70 - 99 mg/dL    BUN 14  6 - 23 mg/dL    Creatinine, Ser 1.61  0.50 - 1.35 mg/dL    Calcium 8.9  8.4 - 09.6 mg/dL    GFR calc non Af Amer >90  >90 mL/min    GFR calc Af Amer >90  >90 mL/min   CBC     Status: Abnormal   Collection Time   04/17/12  3:46 AM      Component Value Range Comment   WBC 8.8  4.0 - 10.5 K/uL    RBC 4.44  4.22 - 5.81 MIL/uL    Hemoglobin 11.9 (*) 13.0 - 17.0 g/dL    HCT 04.5 (*) 40.9 - 52.0 %    MCV 82.0  78.0 - 100.0 fL    MCH 26.8  26.0 - 34.0 pg    MCHC 32.7  30.0 - 36.0 g/dL    RDW 81.1  91.4 - 78.2 %    Platelets 258  150 - 400 K/uL     Dg Chest Port 1 View  04/16/2012  *RADIOLOGY REPORT*  Clinical Data: Pneumonia, pleural effusions  PORTABLE CHEST - 1 VIEW  Comparison: 04/09/2012  Findings: Tracheostomy projects in expected location. Slightly improved aeration.  Persistent dense left retro cardiac consolidation / atelectasis and streaky subsegmental atelectasis, infiltrate or scarring at the right lung base.  There may be small pleural layering pleural effusions.  Heart size upper limits normal for technique.  Atheromatous aortic arch.  IMPRESSION:  1.  Slightly improved aeration with persistent bibasilar atelectasis or infiltrates, left greater than right.  Original Report Authenticated By: Osa Craver, M.D.    Review of Systems  HENT: Positive for tinnitus.   All other systems reviewed and are negative.   Blood pressure 112/77, pulse 84, temperature 98.5 F (36.9 C), temperature source Oral, resp. rate 26, height 6\' 2"  (1.88 m), weight 67 kg (147 lb 11.3 oz), SpO2 93.00%. Physical Exam  Constitutional: He is  oriented to person, place, and time. He appears well-developed and well-nourished. No distress.  HENT:  Head: Normocephalic and atraumatic.  Right Ear: External ear normal.  Left Ear: External ear normal.  Nose: Nose normal.  Mouth/Throat: Oropharynx is clear and moist.  Eyes: Conjunctivae and EOM are normal. Pupils are equal, round, and reactive to light.  Neck: Normal range of motion. Neck supple.       Cuffed #6 proximal extended length trach tube in place, cuff deflated.  Breathy voice.  Cardiovascular: Normal rate.   Respiratory: Effort normal.  GI:       Did not examine.  Genitourinary:       Did not examine.  Musculoskeletal: Normal range of motion.  Neurological: He is alert and oriented to person, place, and time. No cranial nerve deficit.  Skin: Skin is warm and dry.  Psychiatric: He has a normal mood and affect. His behavior is normal. Judgment and thought content normal.    Assessment/Plan: Zenker's diverticulum, dysphagia To OR for Zenker's diverticulotomy.  Michael Ellison 04/17/2012, 12:26 PM

## 2012-04-17 NOTE — Anesthesia Preprocedure Evaluation (Addendum)
Anesthesia Evaluation  Patient identified by MRN, date of birth, ID band Patient awake    Reviewed: Allergy & Precautions, H&P , NPO status , Patient's Chart, lab work & pertinent test results, reviewed documented beta blocker date and time   Airway Mallampati: I TM Distance: >3 FB Neck ROM: Full    Dental  (+) Teeth Intact and Dental Advisory Given   Pulmonary pneumonia , Current Smoker,          Cardiovascular hypertension, Pt. on medications     Neuro/Psych PSYCHIATRIC DISORDERS    GI/Hepatic   Endo/Other    Renal/GU      Musculoskeletal   Abdominal   Peds  Hematology   Anesthesia Other Findings   Reproductive/Obstetrics                          Anesthesia Physical Anesthesia Plan  ASA: II  Anesthesia Plan: General   Post-op Pain Management:    Induction: Intravenous  Airway Management Planned: Oral ETT  Additional Equipment:   Intra-op Plan:   Post-operative Plan: Extubation in OR  Informed Consent: I have reviewed the patients History and Physical, chart, labs and discussed the procedure including the risks, benefits and alternatives for the proposed anesthesia with the patient or authorized representative who has indicated his/her understanding and acceptance.     Plan Discussed with: CRNA and Surgeon  Anesthesia Plan Comments:         Anesthesia Quick Evaluation

## 2012-04-17 NOTE — Anesthesia Postprocedure Evaluation (Signed)
  Anesthesia Post-op Note  Patient: Michael Ellison  Procedure(s) Performed: Procedure(s) (LRB): ZENKER'S DIVERTICULECTOMY ENDOSCOPIC (N/A)  Patient Location: PACU  Anesthesia Type: General  Level of Consciousness: awake and alert   Airway and Oxygen Therapy: Patient Spontanous Breathing and Patient connected to tracheostomy mask oxygen  Post-op Pain: mild  Post-op Assessment: Post-op Vital signs reviewed  Post-op Vital Signs: Reviewed  Complications: No apparent anesthesia complications2

## 2012-04-17 NOTE — Brief Op Note (Signed)
02/24/2012 - 04/17/2012  1:12 PM  PATIENT:  Michael Ellison  66 y.o. male  PRE-OPERATIVE DIAGNOSIS:  zenkers diverticulum, dysphagia  POST-OPERATIVE DIAGNOSIS:  same  PROCEDURE:  Procedure(s) (LRB): ZENKER'S DIVERTICULECTOMY ENDOSCOPIC (N/A)  SURGEON:  Surgeon(s) and Role:    * Christia Reading, MD - Primary  PHYSICIAN ASSISTANT:   ASSISTANTS: none   ANESTHESIA:   general  EBL:  Total I/O In: 60 [NG/GT:60] Out: 250 [Urine:250]  BLOOD ADMINISTERED:none  DRAINS: none   LOCAL MEDICATIONS USED:  NONE  SPECIMEN:  No Specimen  DISPOSITION OF SPECIMEN:  N/A  COUNTS:  YES  TOURNIQUET:  * No tourniquets in log *  DICTATION: .Other Dictation: Dictation Number [id number]  PLAN OF CARE: Return to 2600  PATIENT DISPOSITION:  PACU - hemodynamically stable.   Delay start of Pharmacological VTE agent (>24hrs) due to surgical blood loss or risk of bleeding: no

## 2012-04-17 NOTE — Progress Notes (Signed)
PT Note  Treatment is cancelled today due to medical issues with patient which prohibited therapy. Patient to OR. Will re-attempt at later date.  Thanks. [facility] [phone] (pager)

## 2012-04-17 NOTE — Progress Notes (Signed)
Patient ID: Michael Ellison, male   DOB: Jan 30, 1946, 66 y.o.   MRN: [id number]  No new complaints.  Trach in place, no new neck swelling. Unable to vocalize and not swallowing anything yet.  Stable post op.  Continue overnight care.

## 2012-04-17 NOTE — Progress Notes (Signed)
Patient ID: Michael Ellison, male   DOB: [birth year], 66 y.o.   MRN: [id number] 46 Days Post-Op  Subjective: Doing ok overall, denies abd pain, vomiting, some nausea at times, +BMs, off vent  Objective: Vital signs in last 24 hours: Temp:  [97.9 F (36.6 C)-98.5 F (36.9 C)] 98.1 F (36.7 C) (07/01 0407) Pulse Rate:  [80-93] 85  (07/01 0407) Resp:  [21-29] 22  (07/01 0407) BP: (100-115)/(66-82) 112/74 mmHg (07/01 0407) SpO2:  [89 %-94 %] 93 % (07/01 0407) FiO2 (%):  [35 %] 35 % (07/01 0407) Weight:  [147 lb 11.3 oz (67 kg)] 147 lb 11.3 oz (67 kg) (07/01 0407) Last BM Date: 04/16/12  Intake/Output from previous day: 06/30 0701 - 07/01 0700 In: 1225 [NG/GT:400] Out: 800 [Urine:800] Intake/Output this shift:    General appearance: alert, cooperative and no distress Abdomen: Wound granulating well, beefy red tissue, no exudate.  Soft, nontender, +bs  Lab Results:   [facility] 04/17/12 0346  WBC 8.8  HGB 11.9*  HCT 36.4*  PLT 258   BMET  Basename 04/17/12 0346 04/15/12 0339  NA 135 138  K 3.9 3.7  CL 99 102  CO2 28 27  GLUCOSE 85 94  BUN 14 13  CREATININE 0.59 0.52  CALCIUM 8.9 8.5   PT/INR No results found for this basename: LABPROT:2,INR:2 in the last 72 hours ABG No results found for this basename: PHART:2,PCO2:2,PO2:2,HCO3:2 in the last 72 hours  Studies/Results: Dg Chest Port 1 View  04/16/2012  *RADIOLOGY REPORT*  Clinical Data: Pneumonia, pleural effusions  PORTABLE CHEST - 1 VIEW  Comparison: 04/09/2012  Findings: Tracheostomy projects in expected location. Slightly improved aeration.  Persistent dense left retro cardiac consolidation / atelectasis and streaky subsegmental atelectasis, infiltrate or scarring at the right lung base.  There may be small pleural layering pleural effusions.  Heart size upper limits normal for technique.  Atheromatous aortic arch.  IMPRESSION:  1.  Slightly improved aeration with persistent bibasilar atelectasis or infiltrates, left  greater than right.  Original Report Authenticated By: Osa Craver, M.D.    Anti-infectives: Anti-infectives     Start     Dose/Rate Route Frequency Ordered Stop   03/28/12 1500   levofloxacin (LEVAQUIN) IVPB 750 mg        750 mg 100 mL/hr over 90 Minutes Intravenous Every 24 hours 03/28/12 1348 04/11/12 1642   03/27/12 1600   vancomycin (VANCOCIN) 1,250 mg in sodium chloride 0.9 % 250 mL IVPB  Status:  Discontinued        1,250 mg 166.7 mL/hr over 90 Minutes Intravenous Every 12 hours 03/27/12 0541 03/28/12 1348   03/25/12 0600   vancomycin (VANCOCIN) 750 mg in sodium chloride 0.9 % 150 mL IVPB  Status:  Discontinued        750 mg 150 mL/hr over 60 Minutes Intravenous Every 12 hours 03/24/12 1423 03/27/12 0541   03/24/12 1600   vancomycin (VANCOCIN) 1,500 mg in sodium chloride 0.9 % 500 mL IVPB        1,500 mg 250 mL/hr over 120 Minutes Intravenous  Once 03/24/12 1423 03/24/12 1856   03/24/12 1530   cefTAZidime (FORTAZ) 1 g in dextrose 5 % 50 mL IVPB  Status:  Discontinued        1 g 100 mL/hr over 30 Minutes Intravenous Every 8 hours 03/24/12 1423 04/07/12 1005   03/24/12 1400   vancomycin (VANCOCIN) IVPB 1000 mg/200 mL premix  Status:  Discontinued  1,000 mg 200 mL/hr over 60 Minutes Intravenous Every 12 hours 03/24/12 1353 03/24/12 1400   03/03/12 1800   vancomycin (VANCOCIN) 1,750 mg in sodium chloride 0.9 % 500 mL IVPB  Status:  Discontinued        1,750 mg 250 mL/hr over 120 Minutes Intravenous Every 12 hours 03/03/12 0628 03/03/12 0918   02/29/12 1900   micafungin (MYCAMINE) 100 mg in sodium chloride 0.9 % 100 mL IVPB  Status:  Discontinued        100 mg 100 mL/hr over 1 Hours Intravenous Every 24 hours 02/29/12 1754 03/01/12 0933   02/29/12 1730   vancomycin (VANCOCIN) 1,250 mg in sodium chloride 0.9 % 250 mL IVPB  Status:  Discontinued        1,250 mg 166.7 mL/hr over 90 Minutes Intravenous Every 12 hours 02/29/12 1644 03/03/12 0628   02/29/12 1715    vancomycin (VANCOCIN) IVPB 1000 mg/200 mL premix  Status:  Discontinued        1,000 mg 200 mL/hr over 60 Minutes Intravenous  Once 02/29/12 1637 02/29/12 1647   02/29/12 1645   piperacillin-tazobactam (ZOSYN) IVPB 3.375 g  Status:  Discontinued        3.375 g 100 mL/hr over 30 Minutes Intravenous  Once 02/29/12 1637 02/29/12 1641   02/28/12 1800   piperacillin-tazobactam (ZOSYN) IVPB 3.375 g  Status:  Discontinued        3.375 g 12.5 mL/hr over 240 Minutes Intravenous 4 times per day 02/28/12 1454 02/28/12 1510   02/28/12 1800   piperacillin-tazobactam (ZOSYN) IVPB 3.375 g  Status:  Discontinued        3.375 g 12.5 mL/hr over 240 Minutes Intravenous Every 8 hours 02/28/12 1510 03/14/12 1700   02/25/12 1109   dextrose 5 % with cefOXitin (MEFOXIN) ADS Med     Comments: HOLLIE, CHRISTINA: cabinet override         02/25/12 1109 02/25/12 2314   02/25/12 1100   cefOXitin (MEFOXIN) 1 g in dextrose 5 % 50 mL IVPB  Status:  Discontinued        1 g 100 mL/hr over 30 Minutes Intravenous 60 min pre-op 02/25/12 1100 02/25/12 1542          Assessment/Plan: s/p Procedure(s) (LRB): DEBRIDEMENT CLOSURE/ABDOMINAL WOUND (N/A)  Continue dressing changes daily PEG in Place No changes from surgical standpoint  LOS: 53 days    Hanah Moultry 04/17/2012

## 2012-04-18 NOTE — Op Note (Signed)
Michael Ellison, Michael Ellison              ACCOUNT NO.:  [id number]  MEDICAL RECORD NO.:  [id number]  LOCATION:                                 FACILITY:  PHYSICIAN:  Antony Contras, MD     DATE OF BIRTH:  [birth year]  DATE OF PROCEDURE:  04/17/2012 DATE OF DISCHARGE:                              OPERATIVE REPORT   PREOPERATIVE DIAGNOSES: 1. Zenker diverticulum. 2. Dysphagia.  POSTOPERATIVE DIAGNOSES: 1. Zenker diverticulum. 2. Dysphagia.  PROCEDURE:  Endoscopic Zenker diverticulectomy.  SURGEON:  Excell Seltzer. Jenne Pane, M.D.  ANESTHESIA:  General endotracheal anesthesia.  COMPLICATIONS:  None.  INDICATION:  The patient is a 66 year old male who has had a long hospitalization related to complications from the management of a small- bowel obstruction.  He required a trach tube and PEG tube as part of his care and recently, he underwent a swallow evaluation by Speech Pathology by modified barium swallow.  The study demonstrated a large Zenker diverticulum.  The patient was aware of some trouble swallowing prior to his hospitalization, but it is much worse now.  He presents to the operating room for surgical management.  FINDINGS:  A large Zenker diverticulum was identified in the usual position.  There was a retained pill in the diverticulum.  The common wall was divided opening the diverticulum into the esophagus.  PROCEDURE:  The patient was identified in the holding room.  An informed consent having been obtained including discussion of risks, benefits, alternatives, and the patient was brought to the operative suite and put on the operative table in supine position.  An endotracheal anesthesia was induced via his tracheostomy tube.  The eyes were closed.  The bed was turned to 90 degrees from anesthesia.  A tooth guard was placed over the upper teeth and Weerda bivalve scope was then placed into the mouth and passed down to the esophagus suctioning the pharynx.  The scope was then  positioned to identify the native of esophagus and diverticulum pouch.  The valves were then placed so that the upper valve was into the esophagus and lower valve was into the pouch.  The scope was opened exposing the common wall.  The scope was then placed in suspension on the Mayo stand.  A 0-degree telescope was used to make a photograph. The staple gun was then inserted and placed over the common wall to the top of the staple gun, which was then engaged and activated in order to apply the staple line and divide the tissue.  The staple gun was then released and removed.  The telescope was again used and there was additional wall left and so a second cartridge was loaded and the staple gun was inserted and engaged second time and second staple line applied. The staple gun was then removed and a 0-degree telescope again used to evaluate the site.  Endoscopic scissors were then used to divide a little bit of the common wall between the staple lines just a slight degree.  After this, the pharynx and esophagus were suctioned, and the bivalve scope was taken out of suspension and released in order to remove it from the mouth.  The airway was suctioned on  the way out.  The patient was then returned to anesthesia for wake-up and was allowed to wake up with the tracheostomy tube still in place.  He was then moved to the recovery room in stable condition.     Antony Contras, MD     DDB/MEDQ  D:  04/17/2012  T:  04/18/2012  Job:  [id number]

## 2012-04-18 NOTE — Progress Notes (Signed)
1 Day Post-Op  Subjective: Doing well today.  Throat is moderately sore, better than yesterday.  No complaints otherwise.  Objective: Vital signs in last 24 hours: Temp:  [97.3 F (36.3 C)-98.3 F (36.8 C)] 98 F (36.7 C) (07/02 0625) Pulse Rate:  [82-94] 94  (07/02 0738) Resp:  [21-29] 27  (07/02 0738) BP: (112-138)/(77-93) 120/82 mmHg (07/02 0738) SpO2:  [91 %-95 %] 91 % (07/02 0751) FiO2 (%):  [35 %-40 %] 40 % (07/02 0751) Weight:  [68 kg (149 lb 14.6 oz)] 68 kg (149 lb 14.6 oz) (07/01 2354) Last BM Date: 04/16/12  Intake/Output from previous day: 07/01 0701 - 07/02 0700 In: 1810 [I.V.:1550; NG/GT:260] Out: 250 [Urine:250] Intake/Output this shift:    General appearance: alert, cooperative and no distress Neck: #6 extended length trach tube in place.  Lab Results:   [facility] 04/17/12 0346  WBC 8.8  HGB 11.9*  HCT 36.4*  PLT 258   BMET  Basename 04/17/12 0346  NA 135  K 3.9  CL 99  CO2 28  GLUCOSE 85  BUN 14  CREATININE 0.59  CALCIUM 8.9   PT/INR No results found for this basename: LABPROT:2,INR:2 in the last 72 hours ABG No results found for this basename: PHART:2,PCO2:2,PO2:2,HCO3:2 in the last 72 hours  Studies/Results: No results found.  Anti-infectives: Anti-infectives     Start     Dose/Rate Route Frequency Ordered Stop   03/28/12 1500   levofloxacin (LEVAQUIN) IVPB 750 mg        750 mg 100 mL/hr over 90 Minutes Intravenous Every 24 hours 03/28/12 1348 04/11/12 1642   03/27/12 1600   vancomycin (VANCOCIN) 1,250 mg in sodium chloride 0.9 % 250 mL IVPB  Status:  Discontinued        1,250 mg 166.7 mL/hr over 90 Minutes Intravenous Every 12 hours 03/27/12 0541 03/28/12 1348   03/25/12 0600   vancomycin (VANCOCIN) 750 mg in sodium chloride 0.9 % 150 mL IVPB  Status:  Discontinued        750 mg 150 mL/hr over 60 Minutes Intravenous Every 12 hours 03/24/12 1423 03/27/12 0541   03/24/12 1600   vancomycin (VANCOCIN) 1,500 mg in sodium chloride 0.9  % 500 mL IVPB        1,500 mg 250 mL/hr over 120 Minutes Intravenous  Once 03/24/12 1423 03/24/12 1856   03/24/12 1530   cefTAZidime (FORTAZ) 1 g in dextrose 5 % 50 mL IVPB  Status:  Discontinued        1 g 100 mL/hr over 30 Minutes Intravenous Every 8 hours 03/24/12 1423 04/07/12 1005   03/24/12 1400   vancomycin (VANCOCIN) IVPB 1000 mg/200 mL premix  Status:  Discontinued        1,000 mg 200 mL/hr over 60 Minutes Intravenous Every 12 hours 03/24/12 1353 03/24/12 1400   03/03/12 1800   vancomycin (VANCOCIN) 1,750 mg in sodium chloride 0.9 % 500 mL IVPB  Status:  Discontinued        1,750 mg 250 mL/hr over 120 Minutes Intravenous Every 12 hours 03/03/12 0628 03/03/12 0918   02/29/12 1900   micafungin (MYCAMINE) 100 mg in sodium chloride 0.9 % 100 mL IVPB  Status:  Discontinued        100 mg 100 mL/hr over 1 Hours Intravenous Every 24 hours 02/29/12 1754 03/01/12 0933   02/29/12 1730   vancomycin (VANCOCIN) 1,250 mg in sodium chloride 0.9 % 250 mL IVPB  Status:  Discontinued  1,250 mg 166.7 mL/hr over 90 Minutes Intravenous Every 12 hours 02/29/12 1644 03/03/12 0628   02/29/12 1715   vancomycin (VANCOCIN) IVPB 1000 mg/200 mL premix  Status:  Discontinued        1,000 mg 200 mL/hr over 60 Minutes Intravenous  Once 02/29/12 1637 02/29/12 1647   02/29/12 1645   piperacillin-tazobactam (ZOSYN) IVPB 3.375 g  Status:  Discontinued        3.375 g 100 mL/hr over 30 Minutes Intravenous  Once 02/29/12 1637 02/29/12 1641   02/28/12 1800   piperacillin-tazobactam (ZOSYN) IVPB 3.375 g  Status:  Discontinued        3.375 g 12.5 mL/hr over 240 Minutes Intravenous 4 times per day 02/28/12 1454 02/28/12 1510   02/28/12 1800   piperacillin-tazobactam (ZOSYN) IVPB 3.375 g  Status:  Discontinued        3.375 g 12.5 mL/hr over 240 Minutes Intravenous Every 8 hours 02/28/12 1510 03/14/12 1700   02/25/12 1109   dextrose 5 % with cefOXitin (MEFOXIN) ADS Med     Comments: HOLLIE, CHRISTINA:  cabinet override         02/25/12 1109 02/25/12 2314   02/25/12 1100   cefOXitin (MEFOXIN) 1 g in dextrose 5 % 50 mL IVPB  Status:  Discontinued        1 g 100 mL/hr over 30 Minutes Intravenous 60 min pre-op 02/25/12 1100 02/25/12 1542          Assessment/Plan: s/p Procedure(s) (LRB): ZENKER'S DIVERTICULECTOMY ENDOSCOPIC (N/A) Speech pathology has already evaluated today and delaying swallow therapy due to pain.  Pain is improving and I anticipate that a repeat swallow test could be done tomorrow.  Depending on results, he can start thin liquids by mouth at that time and advance to solids Thursday, if tolerable.  LOS: 54 days    Carvell Hoeffner 04/18/2012

## 2012-04-18 NOTE — Progress Notes (Signed)
Physical Therapy Treatment Patient Details Name: Michael Ellison MRN: [id number] DOB: [birth year] Today's Date: 04/18/2012 Time: 1151-1209 PT Time Calculation (min): 18 min  PT Assessment / Plan / Recommendation Comments on Treatment Session  Patient s/p multiple medical problems after SB resection including prolonged VDRF and trach.  PAtient has progressed well with mobility.  Will continue to progress as able.      Follow Up Recommendations  Skilled nursing facility;Supervision/Assistance - 24 hour    Barriers to Discharge  None      Equipment Recommendations  Defer to next venue    Recommendations for Other Services OT consult  Frequency Min 2X/week (decrease frequency secondary to patient with good progress)   Plan Discharge plan remains appropriate;Frequency needs to be updated    Precautions / Restrictions Precautions Precautions: Fall Precaution Comments: trach and PEG Restrictions Weight Bearing Restrictions: No   Pertinent Vitals/Pain VSS, no pain    Mobility  Bed Mobility Bed Mobility: Rolling Left;Left Sidelying to Sit;Sitting - Scoot to Edge of Bed Rolling Right: Not tested (comment) Rolling Left: 6: Modified independent (Device/Increase time);With rail Right Sidelying to Sit: Not tested (comment) Left Sidelying to Sit: 6: Modified independent (Device/Increase time);With rails;HOB elevated Supine to Sit: Not tested (comment) Sitting - Scoot to Edge of Bed: 6: Modified independent (Device/Increase time) Sit to Supine: Not Tested (comment) Transfers Transfers: Sit to Stand;Stand to Sit Sit to Stand: 5: Supervision;With upper extremity assist;From bed Stand to Sit: 5: Supervision;With upper extremity assist;With armrests;To chair/3-in-1 Stand Pivot Transfers: Not tested (comment) Details for Transfer Assistance: Patient doing better each treatment.  Safety improving daily.  Still needs cues for hand placement. Ambulation/Gait Ambulation/Gait Assistance: 5:  Supervision Ambulation Distance (Feet): 300 Feet Assistive device: Rolling walker Ambulation/Gait Assistance Details: Patient with no LOB today.  Did well with ambulation with RW today.  Sats >97% on 35% trach collar with ambulation.   Gait Pattern: Step-through pattern Stairs: No Wheelchair Mobility Wheelchair Mobility: No    Exercises General Exercises - Lower Extremity Heel Slides: AROM;Both;10 reps;Seated Hip Flexion/Marching: AROM;Both;10 reps;Seated   PT Goals Acute Rehab PT Goals PT Goal: Supine/Side to Sit - Progress: Progressing toward goal PT Goal: Sit at Edge Of Bed - Progress: Met PT Goal: Sit to Stand - Progress: Progressing toward goal PT Goal: Stand to Sit - Progress: Progressing toward goal PT Goal: Ambulate - Progress: Progressing toward goal  Visit Information  Last PT Received On: 04/18/12 Assistance Needed: +2 (for lines )    Subjective Data  Subjective: "I enjoy walking."   Cognition  Overall Cognitive Status: Appears within functional limits for tasks assessed/performed Difficult to assess due to: Tracheostomy Arousal/Alertness: Awake/alert Orientation Level: Appears intact for tasks assessed Behavior During Session: [facility] for tasks performed    Balance  Static Sitting Balance Static Sitting - Level of Assistance: Not tested (comment) Static Standing Balance Static Standing - Balance Support: Bilateral upper extremity supported;During functional activity Static Standing - Level of Assistance: 5: Stand by assistance Dynamic Standing Balance Dynamic Standing - Balance Support: Bilateral upper extremity supported;During functional activity Dynamic Standing - Level of Assistance: 5: Stand by assistance  End of Session PT - End of Session Equipment Utilized During Treatment: Gait belt Activity Tolerance: Patient limited by fatigue Patient left: in chair;with call bell/phone within reach Nurse Communication: Mobility status        Michael Ellison 04/18/2012, 3:52 PM [facility] [phone] (pager)

## 2012-04-18 NOTE — Progress Notes (Signed)
Name: Michael Ellison MRN: [id number] DOB: 10-07-1946    LOS: 54 Date of admit 02/24/2012  8:34 AM  PULMONARY / CRITICAL CARE MEDICINE  Pt Profile:   66 y/o male smoker admitted to [facility] 02/24/2012 with n/v and abdominal pain due to SBO due to adhesions.  Had ex lap with SB resection 5/10.  Developed delirium post-op.  Transferred to [facility] 5/14 for respiratory failure requiring intubation.  Developed abdominal fascial dehiscence and taken back to OR 5/16.  Failed extubation, and required tracheostomy 5/22. Weaned to ATC, s/p Endoscopic Zenker diverticulectomy.  PMHx ETOH, HTN, Prostate cancer  Interval hx:  5/16: OR for fascial dehiscence repair- closed with wound vac 5/18- failed weaning 5/19- extubated 5/21- re-intubated for hypercarbia 5/22-improved co2 and neurostatus, pos almost 2 liters 5/22 SVT>>resolved spontaneously 5/30 -off vent for 24 hours, planned Panda placement 6/1 - up in chair, improved. 6/3 - desat, increased WOB, vent 6/3 - leak trach, improved with extra long 6/5 - FEES eval: failed, still showing evidence of aspiration.  6/16 - Few liquid stools with streaks of blood,  H/h stable.  (has been having mucoid / bloody streaked stool -surgeons aware) 6/18 Perc gastrostomy per IR 6/21 started 24 h T collar 6/25 - still on TC. Did PMV 7/1 Endoscopic Zenker diverticulectomy.   Subjective: Comfortable on T collar overnight; no changes overnight .  satn 95% on 35% trach collar, desaturates with PMV Speech ok.Feels well. Secretions mild +  Vital Signs: Temp:  [97.3 F (36.3 C)-98.4 F (36.9 C)] 98.4 F (36.9 C) (07/02 0800) Pulse Rate:  [82-97] 97  (07/02 0800) Resp:  [21-30] 30  (07/02 0800) BP: (112-138)/(77-93) 118/81 mmHg (07/02 0800) SpO2:  [91 %-95 %] 91 % (07/02 0751) FiO2 (%):  [35 %-40 %] 35 % (07/02 0800) Weight:  [68 kg (149 lb 14.6 oz)] 68 kg (149 lb 14.6 oz) (07/01 2354)  Physical Examination: General - Alert and interactive. HEENT - trach site clean,  #6.0 xlt Cardiac - regular, no m/r/g Chest -a few scattered crackles, no wheezing, good airflow x decreased L Base. Abd - round/soft, bs+ Ext - no edema Neuro - awake and interacting, moves all ext. Talking. Looking stronger  Intake/Output Summary (Last 24 hours) at 04/18/12 1018 Last data filed at 04/18/12 0800  Gross per 24 hour  Intake   1825 ml  Output      0 ml  Net   1825 ml   CBC  Lab 04/17/12 0346  HGB 11.9*  HCT 36.4*  WBC 8.8  PLT 258   BMET  Lab 04/17/12 0346 04/15/12 0339 04/13/12 0425  NA 135 138 139  K 3.9 3.7 --  CL 99 102 99  CO2 28 27 32  GLUCOSE 85 94 119*  BUN 14 13 14   CREATININE 0.59 0.52 0.67  CALCIUM 8.9 8.5 8.3*  MG -- -- --  PHOS -- -- 3.1   Lab Results  Component Value Date   ALT 10 03/29/2012   AST 13 03/29/2012   ALKPHOS 49 03/29/2012   BILITOT 0.3 03/29/2012    Intake/Output Summary (Last 24 hours) at 04/18/12 1018 Last data filed at 04/18/12 0800  Gross per 24 hour  Intake   1825 ml  Output      0 ml  Net   1825 ml   pcxr 6/30 Bibasilar atx   - improved   ASSESSMENT AND PLAN  Acute hypoxic/hypercapnic respiratory failure 2nd to delirium, aspiration pneumonia, probable COPD with bullous emphysema with  hx of smoking.  Failed extubation, and s/p trach 5/22. Xtra long 6 cuffed 6/3>>>   Plan: -Cont scheduled BDs, continue ICS -TC as tolerated 24 h per daysince  6/21, awaiting drop in FIO2 to 28% before tr to SNF -6/30 started gentle diuresis with lasix 40 to see if hypoxia improves further (>40L POS) -Cont vibravest. -pmv as tolerated -XLT trach can be changed when in OR with ENT    SBO s/p lap with SB resection 5/10 complicated by post-op ileus and fascial dehiscence, and return to OR 5/16. Chronic pain. Dysphagia MBS 6/26 >>primary severe cervical esophageal dysphagia with a large Zenker's Diverticulum confirmed by radiologist - Endoscopic Zenker diverticulectomy.Jenne Pane 7/1  Plan: - Post-op care per [facility], appreciate wound  care -ct peg Tfs    Hypophosphatemia/ hypokalemia - repleted  Mild diarrhea with blood streaks: Small quantity, cdiff negative, Hb stable.  If worsens, will need to change lovenox to SCD.  -  rectal pouch  6/21   INFECTIOUS DZ Cultures:  BCX2 5/14 >>1/2 gram neg rod>>> 1/2 Klebsiella  UC 5/14 >>Enterococcus (sensitive to ampicillin)  Sputum 5/14>> Klebsiella pneumoniae (pan sensitive)  Wound 5/14>>Klebsiella Pneumoniae (pan sensitive) & Enterobacter Cloacae (resistant to ancef, cefoxitin)  BC X2 5/20>>>ng  Sputum 5/22>>>klebs, enterobacter  Sputum 6/7>KLEBSIELLA PNEUMONIAE and STENOTROPHOMONAS MALTOPHILIA.   Antibiotics:  vanc 5/14 >> 5/17  Zosyn 5/13 >>5/28  Vanc 6/9>>>6/11 Ceftaz 6/9 (klebsiella  Sputum and wound)> 6/21 levaquin (stenotrophomonas) 6/11>>>  (6/25)  Wound infection, aspiration pneumonia, UTI.  Plan:  -levaquin 14 days -completed 6/25, off abx    Hypertension-on lisinopril  / norvasc at home.   -Changed to lopressor PO 6/17 (12.5 bid) Plan: stable    DISPO  - snf pending: likley can go on trach collar/ peg , anticipate continued improvement with PT, needs to be on 28% before dc -Updated daughter 6/30 - Transfer to tele if uneventful day   Cyril Mourning MD. [facility]. [place] [facility] Pager [phone] If no response call [phone]    04/18/2012 10:18 AM

## 2012-04-18 NOTE — Progress Notes (Signed)
Patients SpO2 has fluctuated throughout the day. RT increased to 40% TC 07/02 AM due to SpO2 of mid 80's. SpO2 increased to low 90's after change.  Mid-day rounds I saw that the patient had been returned to 35% TC with low 90's SpO2, no change was made then by RT. Afternoon rounds that patient had SpO2 low 80's, patient was suctioned and did some deep breathing. SpO2 still remained in low 80's. RT increased patient to 35%, patients sats increased to low 90's. RT will inform oncoming shift of progress through today.

## 2012-04-18 NOTE — Progress Notes (Signed)
OT Note Pt just worked with PT. Will re attempt OT next day. Thanks, Lise Auer OT

## 2012-04-18 NOTE — Progress Notes (Signed)
Passy-Muir Speaking Valve - Treatment Patient Details  Name: Michael Ellison MRN: [id number] Date of Birth: [birth year]  Today's Date: 04/18/2012 Time: 0800-0822 SLP Time Calculation (min): 22 min  Past Medical History:  Past Medical History  Diagnosis Date  . Hypertension   . Cancer   . Cancer of prostate    Past Surgical History:  Past Surgical History  Procedure Date  . Hernia repair   . Prostate biopsy   . Robotic prostate surgery 2011  . Laparotomy 02/25/2012    Procedure: EXPLORATORY LAPAROTOMY;  Surgeon: [name], MD;  Location: [facility];  Service: General;  Laterality: N/A;  . Bowel resection 02/25/2012    Procedure: SMALL BOWEL RESECTION;  Surgeon: [name], MD;  Location: [facility];  Service: General;;  . Wound debridement 03/02/2012    Procedure: DEBRIDEMENT CLOSURE/ABDOMINAL WOUND;  Surgeon: Cherylynn Ridges, MD;  Location: [facility] OR;  Service: General;  Laterality: N/A;    Assessment / Plan / Recommendation Clinical Impression  Pt 1 day post op diverticulostomy, reports very painful swallow. Pt reports RT suctioned him 15 minutes priro to SLP arrival. Pt at 95% O2 saturation at baseline, sleeping. SLP placed PMSV and sats dropped to 89 and stayed at that level over 20 minutes even when valve removed. SLP deferred swallow therapy due to pts pain and poor saturation level. Will return tomorrow to recheck. Discussed with RN to allow pt to wear valve with close supervision only when necessary until O2 sats improve.     Plan  Continue with current plan of care    Follow Up Recommendations       Pertinent Vitals/Pain NA    SLP Goals Progress/Goals/Alternative treatment plan discussed with pt/caregiver and they: Agree SLP Goal #1: Pt will tolerate PMSV during all waking hours with intermittent supervision without distress.  SLP Goal #1 - Progress: Progressing toward goal SLP Goal #2: Pt will demonstrate independence with valve placement and removal with min verbal and  visual cues.  SLP Goal #2 - Progress: Progressing toward goal   PMSV Trial  PMSV was placed for: 20 minutes Able to redirect subglottic air through upper airway: Yes Able to Attain Phonation: Yes Voice Quality: Normal Able to Expectorate Secretions:  (Not observed this session) Breath Support for Phonation: Mildly decreased Intelligibility: Intelligible Respirations During Trial: 30  SpO2 During Trial: 89 % Behavior: Alert;Cooperative   Tracheostomy Tube  Additional Tracheostomy Tube Assessment Trach Collar Period: 24 hours    Vent Dependency  Vent Dependent: No FiO2 (%): 40 % (Increased due to decreased SpO2)    Cuff Deflation Trial     Nels Munn, [name] 04/18/2012, 8:30 AM

## 2012-04-18 NOTE — Progress Notes (Signed)
Patient was placed on 28% TC and he started to desaturate to 83%.  RT placed pt back to 35%.  Dr. Vassie Loll is made aware of this episode.  Pt still complaint of sore throat  S/p  Surgery.

## 2012-04-18 NOTE — Progress Notes (Signed)
[facility] Progress Note 1 Day Post-Op  Subjective: Patient had Zenker's diverticulectomy removed yesterday by Dr. Jenne Pane.  Objective: Vital signs in last 24 hours: Temp:  [97.3 F (36.3 C)-98.3 F (36.8 C)] 98 F (36.7 C) (07/02 0625) Pulse Rate:  [82-94] 94  (07/02 0738) Resp:  [21-29] 27  (07/02 0738) BP: (112-138)/(77-93) 120/82 mmHg (07/02 0738) SpO2:  [91 %-95 %] 91 % (07/02 0751) FiO2 (%):  [35 %-40 %] 40 % (07/02 0751) Weight:  [68 kg (149 lb 14.6 oz)] 68 kg (149 lb 14.6 oz) (07/01 2354) Last BM Date: 04/16/12  Intake/Output from previous day: 07/01 0701 - 07/02 0700 In: 1810 [I.V.:1550; NG/GT:260] Out: 250 [Urine:250] Intake/Output this shift:    General: No acute distress  Lungs: Clear  Abd: Soft, excellent healing of wound with Hydroget only  Extremities: No changes  Neuro: Intact  Lab Results:  @LABLAST2 (wbc:2,hgb:2,hct:2,plt:2) BMET  Basename 04/17/12 0346  NA 135  K 3.9  CL 99  CO2 28  GLUCOSE 85  BUN 14  CREATININE 0.59  CALCIUM 8.9   PT/INR No results found for this basename: LABPROT:2,INR:2 in the last 72 hours ABG No results found for this basename: PHART:2,PCO2:2,PO2:2,HCO3:2 in the last 72 hours  Studies/Results: No results found.  Anti-infectives: Anti-infectives     Start     Dose/Rate Route Frequency Ordered Stop   03/28/12 1500   levofloxacin (LEVAQUIN) IVPB 750 mg        750 mg 100 mL/hr over 90 Minutes Intravenous Every 24 hours 03/28/12 1348 04/11/12 1642   03/27/12 1600   vancomycin (VANCOCIN) 1,250 mg in sodium chloride 0.9 % 250 mL IVPB  Status:  Discontinued        1,250 mg 166.7 mL/hr over 90 Minutes Intravenous Every 12 hours 03/27/12 0541 03/28/12 1348   03/25/12 0600   vancomycin (VANCOCIN) 750 mg in sodium chloride 0.9 % 150 mL IVPB  Status:  Discontinued        750 mg 150 mL/hr over 60 Minutes Intravenous Every 12 hours 03/24/12 1423 03/27/12 0541   03/24/12 1600   vancomycin (VANCOCIN) 1,500 mg in sodium  chloride 0.9 % 500 mL IVPB        1,500 mg 250 mL/hr over 120 Minutes Intravenous  Once 03/24/12 1423 03/24/12 1856   03/24/12 1530   cefTAZidime (FORTAZ) 1 g in dextrose 5 % 50 mL IVPB  Status:  Discontinued        1 g 100 mL/hr over 30 Minutes Intravenous Every 8 hours 03/24/12 1423 04/07/12 1005   03/24/12 1400   vancomycin (VANCOCIN) IVPB 1000 mg/200 mL premix  Status:  Discontinued        1,000 mg 200 mL/hr over 60 Minutes Intravenous Every 12 hours 03/24/12 1353 03/24/12 1400   03/03/12 1800   vancomycin (VANCOCIN) 1,750 mg in sodium chloride 0.9 % 500 mL IVPB  Status:  Discontinued        1,750 mg 250 mL/hr over 120 Minutes Intravenous Every 12 hours 03/03/12 0628 03/03/12 0918   02/29/12 1900   micafungin (MYCAMINE) 100 mg in sodium chloride 0.9 % 100 mL IVPB  Status:  Discontinued        100 mg 100 mL/hr over 1 Hours Intravenous Every 24 hours 02/29/12 1754 03/01/12 0933   02/29/12 1730   vancomycin (VANCOCIN) 1,250 mg in sodium chloride 0.9 % 250 mL IVPB  Status:  Discontinued        1,250 mg 166.7 mL/hr over 90 Minutes Intravenous Every  12 hours 02/29/12 1644 03/03/12 0628   02/29/12 1715   vancomycin (VANCOCIN) IVPB 1000 mg/200 mL premix  Status:  Discontinued        1,000 mg 200 mL/hr over 60 Minutes Intravenous  Once 02/29/12 1637 02/29/12 1647   02/29/12 1645   piperacillin-tazobactam (ZOSYN) IVPB 3.375 g  Status:  Discontinued        3.375 g 100 mL/hr over 30 Minutes Intravenous  Once 02/29/12 1637 02/29/12 1641   02/28/12 1800   piperacillin-tazobactam (ZOSYN) IVPB 3.375 g  Status:  Discontinued        3.375 g 12.5 mL/hr over 240 Minutes Intravenous 4 times per day 02/28/12 1454 02/28/12 1510   02/28/12 1800   piperacillin-tazobactam (ZOSYN) IVPB 3.375 g  Status:  Discontinued        3.375 g 12.5 mL/hr over 240 Minutes Intravenous Every 8 hours 02/28/12 1510 03/14/12 1700   02/25/12 1109   dextrose 5 % with cefOXitin (MEFOXIN) ADS Med     Comments: HOLLIE,  CHRISTINA: cabinet override         02/25/12 1109 02/25/12 2314   02/25/12 1100   cefOXitin (MEFOXIN) 1 g in dextrose 5 % 50 mL IVPB  Status:  Discontinued        1 g 100 mL/hr over 30 Minutes Intravenous 60 min pre-op 02/25/12 1100 02/25/12 1542          Assessment/Plan: s/p Procedure(s): ZENKER'S DIVERTICULECTOMY ENDOSCOPIC No changes.  LOS: 54 days   Marta Lamas. [name], MD, FACS [phone] [facility] 04/18/2012

## 2012-04-19 ENCOUNTER — Inpatient Hospital Stay (HOSPITAL_COMMUNITY): Payer: Medicare Other

## 2012-04-19 MED ORDER — AMLODIPINE BESYLATE 5 MG PO TABS
5.0000 mg | ORAL_TABLET | Freq: Every day | ORAL | Status: DC
  Administered 2012-04-19 – 2012-04-25 (×7): 5 mg
  Filled 2012-04-19 (×7): qty 1

## 2012-04-19 MED ORDER — FUROSEMIDE 8 MG/ML PO SOLN
40.0000 mg | Freq: Two times a day (BID) | ORAL | Status: DC
  Administered 2012-04-19 – 2012-04-20 (×3): 40 mg
  Filled 2012-04-19 (×5): qty 5

## 2012-04-19 MED ORDER — AMLODIPINE 1 MG/ML ORAL SUSPENSION: 5.0000 mg | Freq: Every day | ORAL | Status: DC

## 2012-04-19 NOTE — Progress Notes (Signed)
Name: Michael Ellison MRN: [id number] DOB: [birth year]    LOS: 55 Date of admit 02/24/2012  8:34 AM  PULMONARY / CRITICAL CARE MEDICINE  Pt Profile:   66 y/o male smoker admitted to [facility] 02/24/2012 with n/v and abdominal pain due to SBO due to adhesions.  Had ex lap with SB resection 5/10.  Developed delirium post-op.  Transferred to [facility] 5/14 for respiratory failure requiring intubation.  Developed abdominal fascial dehiscence and taken back to OR 5/16.  Failed extubation, and required tracheostomy 5/22. Weaned to ATC, s/p Endoscopic Zenker diverticulectomy.  PMHx ETOH, HTN, Prostate cancer  Interval hx:  5/16: OR for fascial dehiscence repair- closed with wound vac 5/18- failed weaning 5/19- extubated 5/21- re-intubated for hypercarbia 5/22-improved co2 and neurostatus, pos almost 2 liters 5/22 SVT>>resolved spontaneously 5/30 -off vent for 24 hours, planned Panda placement 6/1 - up in chair, improved. 6/3 - desat, increased WOB, vent 6/3 - leak trach, improved with extra long 6/5 - FEES eval: failed, still showing evidence of aspiration.  6/16 - Few liquid stools with streaks of blood,  H/h stable.  (has been having mucoid / bloody streaked stool -surgeons aware) 6/18 Perc gastrostomy per IR 6/21 started 24 h T collar 6/25 - still on TC. Did PMV 7/1 Endoscopic Zenker diverticulectomy.   Subjective: Comfortable on T collar overnight; no changes overnight .  satn 95% on 35% trach collar, desaturates with lower FIo2 Speech ok.Feels well. Secretions moderate  Vital Signs: Temp:  [97.7 F (36.5 C)-98.5 F (36.9 C)] 98.3 F (36.8 C) (07/03 0500) Pulse Rate:  [75-99] 75  (07/03 0748) Resp:  [20-36] 20  (07/03 0748) BP: (118-128)/(70-87) 118/70 mmHg (07/03 0500) SpO2:  [90 %-97 %] 97 % (07/03 0748) FiO2 (%):  [35 %] 35 % (07/03 0748) Weight:  [70.2 kg (154 lb 12.2 oz)] 70.2 kg (154 lb 12.2 oz) (07/03 0500)  Physical Examination: General - Alert and interactive. HEENT - trach  site clean, #6.0 xlt Cardiac - regular, no m/r/g Chest -a few scattered crackles, no wheezing, good airflow x decreased L Base. Abd - round/soft, bs+ Ext - no edema Neuro - awake and interacting, moves all ext. Talking. Looking stronger  Intake/Output Summary (Last 24 hours) at 04/19/12 1006 Last data filed at 04/19/12 0956  Gross per 24 hour  Intake    850 ml  Output   1475 ml  Net   -625 ml   CBC  Lab 04/17/12 0346  HGB 11.9*  HCT 36.4*  WBC 8.8  PLT 258   BMET  Lab 04/17/12 0346 04/15/12 0339 04/13/12 0425  NA 135 138 139  K 3.9 3.7 --  CL 99 102 99  CO2 28 27 32  GLUCOSE 85 94 119*  BUN 14 13 14   CREATININE 0.59 0.52 0.67  CALCIUM 8.9 8.5 8.3*  MG -- -- --  PHOS -- -- 3.1   Lab Results  Component Value Date   ALT 10 03/29/2012   AST 13 03/29/2012   ALKPHOS 49 03/29/2012   BILITOT 0.3 03/29/2012    Intake/Output Summary (Last 24 hours) at 04/19/12 1006 Last data filed at 04/19/12 0956  Gross per 24 hour  Intake    850 ml  Output   1475 ml  Net   -625 ml   pcxr 6/30 Bibasilar atx   - improved   ASSESSMENT AND PLAN  Acute hypoxic/hypercapnic respiratory failure 2nd to delirium, aspiration pneumonia, 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  s/p  trach 5/22. Xtra long 6 cuffed 6/3>>>  Persistent hypoxia due to COPD & secretions  Plan: -Cont scheduled BDs, continue ICS -TC as tolerated 24 h per daysince  6/21, awaiting drop in FIO2 to 28% before tr to SNF  -6/30 started gentle diuresis with lasix , increased 40 bid 7/3 to see if hypoxia improves further (>40L POS) -Cont vibravest. -pmv as tolerated -XLT trach can be changed prior to dc     SBO s/p lap with SB resection 5/10 complicated by post-op ileus and fascial dehiscence, and return to OR 5/16. Chronic pain. Dysphagia MBS 6/26 >>primary severe cervical esophageal dysphagia with a large Zenker's Diverticulum confirmed by radiologist - Endoscopic Zenker diverticulectomy.Jenne Pane  7/1  Plan: - Post-op care per CCS, appreciate wound care -ct peg Tfs -rpt swallow evaln today & advance diet if passes    Hypophosphatemia/ hypokalemia - repleted  Mild diarrhea with blood streaks: Small quantity, cdiff negative, Hb stable.  If worsens, will need to change lovenox to SCD.  -  rectal pouch  6/21   INFECTIOUS DZ Cultures:  BCX2 5/14 >>1/2 gram neg rod>>> 1/2 Klebsiella  UC 5/14 >>Enterococcus (sensitive to ampicillin)  Sputum 5/14>> Klebsiella pneumoniae (pan sensitive)  Wound 5/14>>Klebsiella Pneumoniae (pan sensitive) & Enterobacter Cloacae (resistant to ancef, cefoxitin)  BC X2 5/20>>>ng  Sputum 5/22>>>klebs, enterobacter  Sputum 6/7>KLEBSIELLA PNEUMONIAE and STENOTROPHOMONAS MALTOPHILIA.   Antibiotics:  vanc 5/14 >> 5/17  Zosyn 5/13 >>5/28  Vanc 6/9>>>6/11 Ceftaz 6/9 (klebsiella  Sputum and wound)> 6/21 levaquin (stenotrophomonas) 6/11>>>  (6/25)  Wound infection, aspiration pneumonia, UTI.  Plan:  -levaquin 14 days -completed 6/25, off abx    Hypertension-on lisinopril  / norvasc at home.   -Changed to lopressor PO 6/17 (12.5 bid) Plan: -dc metoprolol & resume norvasc (severe COPD)    DISPO  - anticipate continued improvement with PT, needs to be on 28% before dc to SNF, we may have to look into other alternatives since his O2 requirement may remain high -Updated daughter 6/30   Cyril Mourning MD. [facility]. [place] [facility] Pager [phone] If no response call [phone]    04/19/2012 10:06 AM

## 2012-04-19 NOTE — Progress Notes (Signed)
Okay to go to SNF from our standpoint.  Getting barium swallow this morning.  Marta Lamas. [name], MD, FACS [phone] [facility]

## 2012-04-19 NOTE — Procedures (Signed)
Objective Swallowing Evaluation: Modified Barium Swallowing Study  Patient Details  Name: Michael Ellison MRN: [id number] Date of Birth: 1946-01-16  Today's Date: 04/19/2012 Time: 1000-1035 SLP Time Calculation (min): 35 min  Past Medical History:  Past Medical History  Diagnosis Date  . Hypertension   . Cancer   . Cancer of prostate    Past Surgical History:  Past Surgical History  Procedure Date  . Hernia repair   . Prostate biopsy   . Robotic prostate surgery 2011  . Laparotomy 02/25/2012    Procedure: EXPLORATORY LAPAROTOMY;  Surgeon: [name], MD;  Location: [facility];  Service: General;  Laterality: N/A;  . Bowel resection 02/25/2012    Procedure: SMALL BOWEL RESECTION;  Surgeon: [name], MD;  Location: [facility];  Service: General;;  . Wound debridement 03/02/2012    Procedure: DEBRIDEMENT CLOSURE/ABDOMINAL WOUND;  Surgeon: Cherylynn Ridges, MD;  Location: [facility] OR;  Service: General;  Laterality: N/A;   HPI:  66 yo male smoker admitted to [facility] 02/24/2012 with n/v and abdominal pain due to SBO due to adhesions.  Had ex lap with SB resection 5/10.  Developed delirium post-op.  Transferred to [facility] 5/14 for respiratory failure requiring intubation.  Developed fascial dehiscence and taken back to OR 5/16.  Failed extubation 5/19, reintubated and required tracheostomy 5/22.  Has been on and off the vent, PEG placed. Two FEES with no improvemnt. last FEES recommended f/u with MBS due to suspicion for a cervical esophageal dysphagia. Has been tolerating trach collar with PMSV with intermittent supervision for two days. Found to have large Zenker's Diverticulum on last MBS, underwent diverticulostomy on 7/1. MBS to determine if pt is ready to consume POs.      Assessment / Plan / Recommendation Clinical Impression  Clinical impression: Pt presents with much improved swallow function. Mr Kimmons has a mild oral and pharyngeal dysphagia with mild respidals remiaing post swallow due to  lingual residue that falls to pharynx post swallow combined with mildly reduced pharyngeal perstalsis. Cervical Esophageal dysphagia improved after diverticulostomy. Repaired Zenker's is visible with mild pooling with thin liquids and moderate pooling with puree and solids. Pt is recommended to follow solids with liquids and to swallow twice to remove pharyngeal residual and help clear cervical esophageal residual. Pt is recommended to initiate a liquid diet at first and then progres to a regular diet when cleared by MD. SLP will continue to follow.     Treatment Recommendation  Therapy as outlined in treatment plan below    Diet Recommendation Regular;Thin liquid   Liquid Administration via: Cup;No straw Medication Administration: Crushed with puree Supervision: Patient able to self feed Compensations: Slow rate;Small sips/bites;Multiple dry swallows after each bite/sip;Follow solids with liquid Postural Changes and/or Swallow Maneuvers: Seated upright 90 degrees;Upright 30-60 min after meal    Other  Recommendations Oral Care Recommendations: Oral care QID Other Recommendations: Place PMSV during PO intake   Follow Up Recommendations       Frequency and Duration min 2x/week  2 weeks   Pertinent Vitals/Pain NA    SLP Swallow Goals Patient will consume recommended diet without observed clinical signs of aspiration with: Independent assistance Patient will utilize recommended strategies during swallow to increase swallowing safety with: Independent assistance   General HPI: 66 yo male smoker admitted to [facility] 02/24/2012 with n/v and abdominal pain due to SBO due to adhesions.  Had ex lap with SB resection 5/10.  Developed delirium post-op.  Transferred to [facility] 5/14  for respiratory failure requiring intubation.  Developed fascial dehiscence and taken back to OR 5/16.  Failed extubation 5/19, reintubated and required tracheostomy 5/22.  Has been on and off the vent, PEG placed. Two FEES with no  improvemnt. last FEES recommended f/u with MBS due to suspicion for a cervical esophageal dysphagia. Has been tolerating trach collar with PMSV with intermittent supervision for two days. Found to have large Zenker's Diverticulum on last MBS, underwent diverticulostomy on 7/1. MBS to determine if pt is ready to consume POs.  Type of Study: Modified Barium Swallowing Study Reason for Referral: Objectively evaluate swallowing function Previous Swallow Assessment: FEES 03/16/12, MBS 6/26 recommended NPO Diet Prior to this Study: NPO;PEG tube Temperature Spikes Noted: No Respiratory Status: Trach Trach Size and Type: Cuff;Deflated;With PMSV in place;#6;Extra long History of Recent Intubation: Yes Length of Intubations (days): 9 days Date extubated: 03/08/12 Behavior/Cognition: Alert;Cooperative Oral Cavity - Dentition: Adequate natural dentition Oral Motor / Sensory Function: Within functional limits Self-Feeding Abilities: Able to feed self Vision: Functional for self-feeding Patient Positioning: Upright in chair Baseline Vocal Quality: Wet Volitional Cough: Strong Volitional Swallow: Able to elicit    Reason for Referral Objectively evaluate swallowing function   Oral Phase     Pharyngeal Phase Pharyngeal Phase: Impaired   Cervical Esophageal Phase Cervical Esophageal Phase: Impaired    [name], [name] 04/19/2012, 10:58 AM

## 2012-04-19 NOTE — Progress Notes (Signed)
OT Cancellation Note  Treatment cancelled today due to:   Off the floor for modified barium swallowing test. OT to continue to follow acutely.    Michael Ellison [place]   OTR/L Pager: [phone] Office: [phone] .

## 2012-04-19 NOTE — Progress Notes (Signed)
Clinical Social Worker spoke with pt's dtr, [place] [phone]).  CSW updated dtr with new unit CSW contact information ([facility] [id number] [place]).  CSW provided updated contact information and addressed all questions/concerns.  Dtr stated understanding that rehab is important to pt's recovery.   Angelia Mould, MSW, [place] [phone]

## 2012-04-19 NOTE — Progress Notes (Signed)
Speech Language Pathology Dysphagia Treatment Patient Details Name: Michael Ellison MRN: [id number] DOB: 17-Nov-1945 Today's Date: 04/19/2012 Time: 4132-4401 SLP Time Calculation (min): 22 min  Assessment / Plan / Recommendation Clinical Impression  Pt with improved O2 saturation today, maintained in low 90s throughout session with PMSV in place. SLP completed oral care, provided ice chips and 1 sip sof water. Pt with decreased laryngeal elevation, wet vocal quality post swallow though much improved since last trial. SLP will request MBS for this am to objectively evaluate swallow prior to initiating POs.     Diet Recommendation  Continue with Current Diet: NPO    SLP Plan MBS   Pertinent Vitals/Pain NA   Swallowing Goals     General Temperature Spikes Noted: No Respiratory Status: Trach (35 % O2 sats in mid to low 90s) Behavior/Cognition: Alert;Cooperative Oral Cavity - Dentition: Adequate natural dentition Patient Positioning: Upright in bed  Oral Cavity - Oral Hygiene Does patient have any of the following "at risk" factors?: Oxygen therapy - cannula, mask, simple oxygen devices Patient is HIGH RISK - Oral Care Protocol followed (see row info): Yes   Dysphagia Treatment Treatment focused on: Upgraded PO texture trials Treatment Methods/Modalities: Effortful swallow Patient observed directly with PO's: Yes Type of PO's observed: Thin liquids;Ice chips Feeding: Able to feed self Liquids provided via: Cup;Teaspoon Pharyngeal Phase Signs & Symptoms: Wet vocal quality Type of cueing: Verbal Amount of cueing: Minimal   GO     Michael Ellison, [name] 04/19/2012, 8:41 AM

## 2012-04-19 NOTE — Progress Notes (Signed)
2 Days Post-Op  Subjective: Looks good this morning, only c/o is throat soreness from surgery 2 days ago. (repair of his Zenkers diverticulum) Voice is strong and clear. Remains on TC.  Objective: Vital signs in last 24 hours: Temp:  [97.7 F (36.5 C)-98.5 F (36.9 C)] 98.3 F (36.8 C) (07/03 0500) Pulse Rate:  [75-99] 75  (07/03 0748) Resp:  [20-36] 20  (07/03 0748) BP: (118-128)/(70-87) 118/70 mmHg (07/03 0500) SpO2:  [90 %-97 %] 97 % (07/03 0748) FiO2 (%):  [35 %] 35 % (07/03 0748) Weight:  [154 lb 12.2 oz (70.2 kg)] 154 lb 12.2 oz (70.2 kg) (07/03 0500) Last BM Date: 04/17/12  Intake/Output from previous day: 07/02 0701 - 07/03 0700 In: 1075 [I.V.:600; NG/GT:200] Out: 1550 [Urine:1550] Intake/Output this shift:    General appearance: alert, cooperative, appears stated age and no distress. Afebrile Adomen: soft, non tender, wound appears to be granulating in well with Aquacel. + BS, no bloating. Chest: CTA Extremities: no edema, no s/s DVT  Lab Results:   [facility] 04/17/12 0346  WBC 8.8  HGB 11.9*  HCT 36.4*  PLT 258   BMET  Basename 04/17/12 0346  NA 135  K 3.9  CL 99  CO2 28  GLUCOSE 85  BUN 14  CREATININE 0.59  CALCIUM 8.9   PT/INR No results found for this basename: LABPROT:2,INR:2 in the last 72 hours ABG No results found for this basename: PHART:2,PCO2:2,PO2:2,HCO3:2 in the last 72 hours  Studies/Results: No results found.  Anti-infectives: Anti-infectives     Start     Dose/Rate Route Frequency Ordered Stop   03/28/12 1500   levofloxacin (LEVAQUIN) IVPB 750 mg        750 mg 100 mL/hr over 90 Minutes Intravenous Every 24 hours 03/28/12 1348 04/11/12 1642   03/27/12 1600   vancomycin (VANCOCIN) 1,250 mg in sodium chloride 0.9 % 250 mL IVPB  Status:  Discontinued        1,250 mg 166.7 mL/hr over 90 Minutes Intravenous Every 12 hours 03/27/12 0541 03/28/12 1348   03/25/12 0600   vancomycin (VANCOCIN) 750 mg in sodium chloride 0.9 % 150 mL  IVPB  Status:  Discontinued        750 mg 150 mL/hr over 60 Minutes Intravenous Every 12 hours 03/24/12 1423 03/27/12 0541   03/24/12 1600   vancomycin (VANCOCIN) 1,500 mg in sodium chloride 0.9 % 500 mL IVPB        1,500 mg 250 mL/hr over 120 Minutes Intravenous  Once 03/24/12 1423 03/24/12 1856   03/24/12 1530   cefTAZidime (FORTAZ) 1 g in dextrose 5 % 50 mL IVPB  Status:  Discontinued        1 g 100 mL/hr over 30 Minutes Intravenous Every 8 hours 03/24/12 1423 04/07/12 1005   03/24/12 1400   vancomycin (VANCOCIN) IVPB 1000 mg/200 mL premix  Status:  Discontinued        1,000 mg 200 mL/hr over 60 Minutes Intravenous Every 12 hours 03/24/12 1353 03/24/12 1400   03/03/12 1800   vancomycin (VANCOCIN) 1,750 mg in sodium chloride 0.9 % 500 mL IVPB  Status:  Discontinued        1,750 mg 250 mL/hr over 120 Minutes Intravenous Every 12 hours 03/03/12 0628 03/03/12 0918   02/29/12 1900   micafungin (MYCAMINE) 100 mg in sodium chloride 0.9 % 100 mL IVPB  Status:  Discontinued        100 mg 100 mL/hr over 1 Hours Intravenous Every 24  hours 02/29/12 1754 03/01/12 0933   02/29/12 1730   vancomycin (VANCOCIN) 1,250 mg in sodium chloride 0.9 % 250 mL IVPB  Status:  Discontinued        1,250 mg 166.7 mL/hr over 90 Minutes Intravenous Every 12 hours 02/29/12 1644 03/03/12 0628   02/29/12 1715   vancomycin (VANCOCIN) IVPB 1000 mg/200 mL premix  Status:  Discontinued        1,000 mg 200 mL/hr over 60 Minutes Intravenous  Once 02/29/12 1637 02/29/12 1647   02/29/12 1645   piperacillin-tazobactam (ZOSYN) IVPB 3.375 g  Status:  Discontinued        3.375 g 100 mL/hr over 30 Minutes Intravenous  Once 02/29/12 1637 02/29/12 1641   02/28/12 1800   piperacillin-tazobactam (ZOSYN) IVPB 3.375 g  Status:  Discontinued        3.375 g 12.5 mL/hr over 240 Minutes Intravenous 4 times per day 02/28/12 1454 02/28/12 1510   02/28/12 1800   piperacillin-tazobactam (ZOSYN) IVPB 3.375 g  Status:  Discontinued         3.375 g 12.5 mL/hr over 240 Minutes Intravenous Every 8 hours 02/28/12 1510 03/14/12 1700   02/25/12 1109   dextrose 5 % with cefOXitin (MEFOXIN) ADS Med     Comments: HOLLIE, CHRISTINA: cabinet override         02/25/12 1109 02/25/12 2314   02/25/12 1100   cefOXitin (MEFOXIN) 1 g in dextrose 5 % 50 mL IVPB  Status:  Discontinued        1 g 100 mL/hr over 30 Minutes Intravenous 60 min pre-op 02/25/12 1100 02/25/12 1542          Assessment/Plan: s/p Procedure(s) (LRB): ZENKER'S DIVERTICULECTOMY ENDOSCOPIC (N/A) Continue with current wound care regimen.  LOS: 55 days    Ellayna Hilligoss 04/19/2012

## 2012-04-20 LAB — BASIC METABOLIC PANEL
CO2: 28 mEq/L (ref 19–32)
Chloride: 104 mEq/L (ref 96–112)
GFR calc Af Amer: >90 mL/min (ref >90)
Potassium: 3.6 mEq/L (ref 3.5–5.1)

## 2012-04-20 MED ORDER — ALPRAZOLAM 0.5 MG PO TABS: 1.0000 mg | ORAL_TABLET | Freq: Three times a day (TID) | ORAL | Status: DC | PRN

## 2012-04-20 MED ORDER — PANTOPRAZOLE SODIUM 40 MG PO PACK
40.0000 mg | PACK | Freq: Every day | ORAL | Status: DC
  Administered 2012-04-20 – 2012-04-24 (×5): 40 mg via ORAL
  Filled 2012-04-20 (×8): qty 20

## 2012-04-20 MED ORDER — FUROSEMIDE 8 MG/ML PO SOLN
40.0000 mg | Freq: Two times a day (BID) | ORAL | Status: DC
  Administered 2012-04-20 – 2012-04-21 (×2): 40 mg via ORAL
  Filled 2012-04-20 (×4): qty 5

## 2012-04-20 NOTE — Progress Notes (Signed)
Physical Therapy Treatment Patient Details Name: Michael Ellison MRN: [id number] DOB: [birth year] Today's Date: 04/20/2012 Time: 4098-1191 PT Time Calculation (min): 23 min  PT Assessment / Plan / Recommendation Comments on Treatment Session  Patient with excellent progress with mobility and functional activity tolerance.     Follow Up Recommendations  Skilled nursing facility    Barriers to Discharge  None      Equipment Recommendations  Defer to next venue    Recommendations for Other Services  None  Frequency Min 2X/week   Plan Discharge plan remains appropriate;Frequency remains appropriate    Precautions / Restrictions Precautions Precautions: Fall Restrictions Weight Bearing Restrictions: No   Pertinent Vitals/Pain SpO2 93% with gait on 50% trach collar    Mobility  Bed Mobility Details for Bed Mobility Assistance: On edge of bed upon entry Transfers Sit to Stand: 5: Supervision;With upper extremity assist;From bed Stand to Sit: 5: Supervision;With upper extremity assist;To chair/3-in-1;To bed Stand Pivot Transfers: 5: Supervision Details for Transfer Assistance: Questioning cues for hand placement to and from device.  Ambulation/Gait Ambulation/Gait Assistance: 5: Supervision Ambulation Distance (Feet): 450 Feet Assistive device: Rolling walker Ambulation/Gait Assistance Details: Trach collar at 50% with SpO2 at 93%. Speed 1.83 feet/second indicative of borderline falls risk. This demonstrates an increase from last week. Will work on functional efficiency in further treatments. Gait Pattern: Within Functional Limits     PT Goals Acute Rehab PT Goals PT Goal: Sit at Edge Of Bed - Progress: Met PT Goal: Sit to Stand - Progress: Progressing toward goal PT Goal: Stand to Sit - Progress: Progressing toward goal PT Transfer Goal: Bed to Chair/Chair to Bed - Progress: Progressing toward goal PT Goal: Ambulate - Progress: Progressing toward goal  Visit Information  Last PT Received On: 04/20/12 Assistance Needed: +1    Subjective Data  Subjective: Patient reports feeling stronger Patient Stated Goal: Home   Cognition  Overall Cognitive Status: Appears within functional limits for tasks assessed/performed Difficult to assess due to: Tracheostomy Arousal/Alertness: Awake/alert Orientation Level: Appears intact for tasks assessed Behavior During Session: [facility] for tasks performed    Balance  High Level Balance High Level Balance Activites: Turns;Direction changes High Level Balance Comments: With walker and without evidence of imbalance.  End of Session PT - End of Session Equipment Utilized During Treatment: Gait belt Activity Tolerance: Patient tolerated treatment well Patient left: in chair;with call bell/phone within reach;with family/visitor present Nurse Communication: Mobility status    Edwyna Perfect, PT  Pager [phone]  04/20/2012, 11:17 AM

## 2012-04-20 NOTE — Progress Notes (Signed)
Patient weaned to 28% O2 by RT this evening with current oxygen sats at 98%. Patient on continuous pulse ox. Will continue to monitor patient.  Harless Litten, RN 04/19/12

## 2012-04-20 NOTE — Progress Notes (Deleted)
Name: Michael Ellison MRN: [id number] DOB: 1946-10-05    LOS: 56 Date of admit 02/24/2012  8:34 AM  PULMONARY / CRITICAL CARE MEDICINE  Pt Profile:   66 y/o male smoker admitted to [facility] 02/24/2012 with n/v and abdominal pain due to SBO due to adhesions.  Had ex lap with SB resection 5/10.  Developed delirium post-op.  Transferred to [facility] 5/14 for respiratory failure requiring intubation.  Developed abdominal fascial dehiscence and taken back to OR 5/16.  Failed extubation, and required tracheostomy 5/22. Weaned to ATC, s/p Endoscopic Zenker diverticulectomy.  PMHx ETOH, HTN, Prostate cancer  Interval hx:  5/16: OR for fascial dehiscence repair- closed with wound vac 5/18- failed weaning 5/19- extubated 5/21- re-intubated for hypercarbia 5/22-improved co2 and neurostatus, pos almost 2 liters 5/22 SVT>>resolved spontaneously 5/30 -off vent for 24 hours, planned Panda placement 6/1 - up in chair, improved. 6/3 - desat, increased WOB, vent 6/3 - leak trach, improved with extra long 6/5 - FEES eval: failed, still showing evidence of aspiration.  6/16 - Few liquid stools with streaks of blood,  H/h stable.  (has been having mucoid / bloody streaked stool -surgeons aware) 6/18 Perc gastrostomy per IR 6/21 started 24 h T collar 6/25 - still on TC. Did PMV 7/1 Endoscopic Zenker diverticulectomy. 7/4 - ambulated entire distance of 4700 hallway on 50% with 93% spo2  Subjective:  Patient indicates he feels well.  Excited about his improvements.  Wants to go home but is open to rehab efforts.    Vital Signs: Temp:  [97.8 F (36.6 C)-98.5 F (36.9 C)] 98.5 F (36.9 C) (07/04 5621) Pulse Rate:  [72-94] 93  (07/04 0908) Resp:  [16-20] 16  (07/04 0908) BP: (117-128)/(79-85) 117/79 mmHg (07/04 0613) SpO2:  [94 %-97 %] 96 % (07/04 0908) FiO2 (%):  [28 %-35 %] 28 % (07/04 0908) Weight:  [158 lb 8.2 oz (71.9 kg)] 158 lb 8.2 oz (71.9 kg) (07/04 3086)  Physical Examination: General - Alert and  interactive. HEENT - trach site clean, #6.0 xlt Cardiac - regular, no m/r/g Chest -a few scattered crackles, no wheezing, good airflow x decreased L Base. Abd - round/soft, bs+ Ext - no edema Neuro - awake and interacting, moves all ext. Talking. Looking stronger  Intake/Output Summary (Last 24 hours) at 04/20/12 1309 Last data filed at 04/20/12 0900  Gross per 24 hour  Intake    120 ml  Output    800 ml  Net   -680 ml   CBC  Lab 04/17/12 0346  HGB 11.9*  HCT 36.4*  WBC 8.8  PLT 258   BMET  Lab 04/20/12 0540 04/17/12 0346 04/15/12 0339  NA 139 135 138  K 3.6 3.9 --  CL 104 99 102  CO2 28 28 27   GLUCOSE 111* 85 94  BUN 13 14 13   CREATININE 0.61 0.59 0.52  CALCIUM 8.4 8.9 8.5  MG -- -- --  PHOS -- -- --   Lab Results  Component Value Date   ALT 10 03/29/2012   AST 13 03/29/2012   ALKPHOS 49 03/29/2012   BILITOT 0.3 03/29/2012    Intake/Output Summary (Last 24 hours) at 04/20/12 1309 Last data filed at 04/20/12 0900  Gross per 24 hour  Intake    120 ml  Output    800 ml  Net   -680 ml   CXR:  No new films 7/4   ASSESSMENT AND PLAN  Acute hypoxic / hypercapnic respiratory failure 2nd to  delirium, aspiration pneumonia, 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  s/p trach 5/22. Xtra long 6 cuffed 6/3>>>  Persistent hypoxia due to COPD & secretions.  Plan: -Cont scheduled BDs, continue ICS -TC as tolerated 24 h per day since  6/21, will need FIO2 to remain at 28% before tr to SNF  -6/30 started gentle diuresis with lasix , increased 40 bid 7/3 to see if hypoxia improves further (>40L POS) -Cont vibravest. -pmv as tolerated -XLT trach can be changed prior to d/c     SBO s/p lap with SB resection 5/10 complicated by post-op ileus and fascial dehiscence, and return to OR 5/16. Chronic pain. Dysphagia MBS 6/26 >>primary severe cervical esophageal dysphagia with a large Zenker's Diverticulum confirmed by radiologist - Endoscopic Zenker  diverticulectomy.Jenne Pane 7/1  Plan: - Post-op care per CCS, appreciate wound care - ct peg Tfs-->will need to decrease rate as he is now tolerating PO's to allow for "appetite".  Consider reduce rate 7/5    Hypophosphatemia/ hypokalemia - repleted   Mild diarrhea with blood streaks: Small quantity, cdiff negative, Hb stable.  If worsens, will need to change lovenox to SCD.  - improved.    INFECTIOUS DZ Cultures:  BCX2 5/14 >>1/2 gram neg rod>>> 1/2 Klebsiella  UC 5/14 >>Enterococcus (sensitive to ampicillin)  Sputum 5/14>> Klebsiella pneumoniae (pan sensitive)  Wound 5/14>>Klebsiella Pneumoniae (pan sensitive) & Enterobacter Cloacae (resistant to ancef, cefoxitin)  BC X2 5/20>>>ng  Sputum 5/22>>>klebs, enterobacter  Sputum 6/7>KLEBSIELLA PNEUMONIAE and STENOTROPHOMONAS MALTOPHILIA.   Antibiotics:  vanc 5/14 >> 5/17  Zosyn 5/13 >>5/28  Vanc 6/9>>>6/11 Ceftaz 6/9 (klebsiella  Sputum and wound)> 6/21 levaquin (stenotrophomonas) 6/11>>>  (6/25)  Wound infection, aspiration pneumonia, UTI.  Plan:  -levaquin 14 days -completed 6/25, off abx   Hypertension-on lisinopril  / norvasc at home.   -Changed to lopressor PO 6/17 (12.5 bid) Plan:  -cont norvasc--metoprolol stopped  (severe COPD)    DISPO -anticipate continued improvement with PT, needs to be on 28% before dc to SNF, we may have to look into other alternatives since his O2 requirement may remain high -Updated daughter 6/30   [name], NP-C [facility] Pgr: [phone] or [phone]    04/20/2012 1:09 PM

## 2012-04-20 NOTE — Progress Notes (Signed)
Speech Language Pathology Dysphagia Treatment Patient Details Name: Michael Ellison MRN: [id number] DOB: 12/10/1945 Today's Date: 04/20/2012 Time: 4098-1191 SLP Time Calculation (min): 14 min  Assessment / Plan / Recommendation Clinical Impression  F/u diet tolerance assessment complete.  Patient able to self feed thin liquid via cup sip without overt indication of aspiration, min verbal cues to initially verbalize use of throat clear as an aspiration precautions but then able to utilize independently.  Overall, patient appears to be tolerating current diet. SLP will continue to f/u closely at bedside.     Diet Recommendation  Continue with Current Diet: Thin liquid    SLP Plan Continue with current plan of care   Pertinent Vitals/Pain n/a   Swallowing Goals  SLP Swallowing Goals Patient will consume recommended diet without observed clinical signs of aspiration with: Independent assistance Swallow Study Goal #1 - Progress: Progressing toward goal Patient will utilize recommended strategies during swallow to increase swallowing safety with: Independent assistance Swallow Study Goal #2 - Progress: Progressing toward goal  General Temperature Spikes Noted: No Respiratory Status: Trach (28% FiO2) Behavior/Cognition: Alert;Cooperative Oral Cavity - Dentition: Adequate natural dentition Patient Positioning: Upright in chair    Dysphagia Treatment Treatment focused on: Skilled observation of diet tolerance Treatment Methods/Modalities: Skilled observation;Effortful swallow Patient observed directly with PO's: Yes Type of PO's observed: Thin liquids Feeding: Able to feed self Liquids provided via: Cup Type of cueing: Verbal Amount of cueing: Minimal   GO   Ferdinand Lango MA, CCC-SLP [phone]   Emmajo Bennette Meryl 04/20/2012, 1:48 PM

## 2012-04-20 NOTE — Progress Notes (Signed)
Respiratory failure following trauma and surgery  Subjective: Feels good and looking forward to getting out of hospital. No abd pain, no nausea  Objective: Vital signs in last 24 hours: Temp:  [97.8 F (36.6 C)-98.5 F (36.9 C)] 98.5 F (36.9 C) (07/04 1610) Pulse Rate:  [72-94] 93  (07/04 0908) Resp:  [16-20] 16  (07/04 0908) BP: (117-128)/(79-85) 117/79 mmHg (07/04 0613) SpO2:  [94 %-97 %] 96 % (07/04 0908) FiO2 (%):  [28 %-35 %] 28 % (07/04 0908) Weight:  [158 lb 8.2 oz (71.9 kg)] 158 lb 8.2 oz (71.9 kg) (07/04 9604) Last BM Date: 04/19/12  Intake/Output from previous day: 07/03 0701 - 07/04 0700 In: -  Out: 1225 [Urine:1225] Intake/Output this shift: Total I/O In: 120 [P.O.:120] Out: 300 [Urine:300]  General appearance: alert, cooperative, no distress and Clearly improved from when I last saw him a few weeks ago GI: soft, non-tender; bowel sounds normal; no masses,  no organomegaly  Lab Results:  Results for orders placed during the hospital encounter of 02/24/12 (from the past 24 hour(s))  BASIC METABOLIC PANEL     Status: Abnormal   Collection Time   04/20/12  5:40 AM      Component Value Range   Sodium 139  135 - 145 mEq/L   Potassium 3.6  3.5 - 5.1 mEq/L   Chloride 104  96 - 112 mEq/L   CO2 28  19 - 32 mEq/L   Glucose, Bld 111 (*) 70 - 99 mg/dL   BUN 13  6 - 23 mg/dL   Creatinine, Ser 5.40  0.50 - 1.35 mg/dL   Calcium 8.4  8.4 - 98.1 mg/dL   GFR calc non Af Amer >90  >90 mL/min   GFR calc Af Amer >90  >90 mL/min     Studies/Results Radiology     MEDS, Scheduled    . albuterol  2.5 mg Nebulization QID  . amLODipine  5 mg Per Tube Daily  . antiseptic oral rinse  15 mL Mouth Rinse QID  . budesonide  0.5 mg Nebulization BID  . chlorhexidine  15 mL Mouth Rinse BID  . enoxaparin  40 mg Subcutaneous Q24H  . escitalopram  10 mg Oral Daily  . feeding supplement  30 mL Per Tube BID  . fentaNYL  100 mcg Intravenous Once  . free water  200 mL Per Tube TID   . furosemide  40 mg Per Tube BID  . pantoprazole sodium  40 mg Per Tube QHS     Assessment: Respiratory failure following trauma and surgery Improved and OK to SNF  Plan: No change our standpoint   LOS: 56 days    Currie Paris, MD, [facility], [place] [phone]   04/20/2012 1:11 PM

## 2012-04-20 NOTE — Progress Notes (Signed)
Name: Michael Ellison MRN: [id number] DOB: 03/08/1946    LOS: 56 Date of admit 02/24/2012  8:34 AM  PULMONARY / CRITICAL CARE MEDICINE  Pt Profile:   66 y/o male smoker admitted to [facility] 02/24/2012 with n/v and abdominal pain due to SBO due to adhesions.  Had ex lap with SB resection 5/10.  Developed delirium post-op.  Transferred to [facility] 5/14 for respiratory failure requiring intubation.  Developed abdominal fascial dehiscence and taken back to OR 5/16.  Failed extubation, and required tracheostomy 5/22. Weaned to ATC, s/p Endoscopic Zenker diverticulectomy.  PMHx ETOH, HTN, Prostate cancer  Interval hx:  5/16: OR for fascial dehiscence repair- closed with wound vac 5/18- failed weaning 5/19- extubated 5/21- re-intubated for hypercarbia 5/22-improved co2 and neurostatus, pos almost 2 liters 5/22 SVT>>resolved spontaneously 5/30 -off vent for 24 hours, planned Panda placement 6/1 - up in chair, improved. 6/3 - desat, increased WOB, vent 6/3 - leak trach, improved with extra long 6/5 - FEES eval: failed, still showing evidence of aspiration.  6/16 - Few liquid stools with streaks of blood,  H/h stable.  (has been having mucoid / bloody streaked stool -surgeons aware) 6/18 Perc gastrostomy per IR 6/21 started 24 h T collar 6/25 - still on TC. Did PMV 7/1 Endoscopic Zenker diverticulectomy.   Subjective: Taking po. Vital Signs: Temp:  [97.8 F (36.6 C)-98.5 F (36.9 C)] 98.5 F (36.9 C) (07/04 4098) Pulse Rate:  [72-94] 93  (07/04 0908) Resp:  [16-20] 16  (07/04 0908) BP: (117-128)/(79-85) 117/79 mmHg (07/04 0613) SpO2:  [94 %-97 %] 96 % (07/04 0908) FiO2 (%):  [28 %-35 %] 28 % (07/04 0908) Weight:  [71.9 kg (158 lb 8.2 oz)] 71.9 kg (158 lb 8.2 oz) (07/04 1191)  Physical Examination: General - Alert and interactive. HEENT - trach site clean, #6.0 xlt Cardiac - regular, no m/r/g Chest -a few scattered crackles, no wheezing, good airflow x decreased L Base. Abd - round/soft,  bs+ Ext - no edema Neuro - awake and interacting, moves all ext. Talking. Looking stronger  Intake/Output Summary (Last 24 hours) at 04/20/12 1349 Last data filed at 04/20/12 0900  Gross per 24 hour  Intake    120 ml  Output    800 ml  Net   -680 ml   CBC  Lab 04/17/12 0346  HGB 11.9*  HCT 36.4*  WBC 8.8  PLT 258   BMET  Lab 04/20/12 0540 04/17/12 0346 04/15/12 0339  NA 139 135 138  K 3.6 3.9 --  CL 104 99 102  CO2 28 28 27   GLUCOSE 111* 85 94  BUN 13 14 13   CREATININE 0.61 0.59 0.52  CALCIUM 8.4 8.9 8.5  MG -- -- --  PHOS -- -- --   Lab Results  Component Value Date   ALT 10 03/29/2012   AST 13 03/29/2012   ALKPHOS 49 03/29/2012   BILITOT 0.3 03/29/2012    Intake/Output Summary (Last 24 hours) at 04/20/12 1349 Last data filed at 04/20/12 0900  Gross per 24 hour  Intake    120 ml  Output    800 ml  Net   -680 ml   pcxr 6/30 Bibasilar atx   - improved   ASSESSMENT AND PLAN  Acute hypoxic/hypercapnic respiratory failure 2nd to delirium, aspiration pneumonia, 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  s/p trach 5/22. Xtra long 6 cuffed 6/3>>>  Persistent hypoxia due to COPD & secretions  Plan: -Cont scheduled BDs, continue  ICS -TC as tolerated 24 h per daysince  6/21, awaiting drop in FIO2 to 28% before tr to SNF  -6/30 started gentle diuresis with lasix , increased 40 bid 7/3 to see if hypoxia improves further (>40L POS) -Cont vibravest. -pmv as tolerated -XLT trach can be changed prior to dc     SBO s/p lap with SB resection 5/10 complicated by post-op ileus and fascial dehiscence, and return to OR 5/16. Chronic pain. Dysphagia MBS 6/26 >>primary severe cervical esophageal dysphagia with a large Zenker's Diverticulum confirmed by radiologist - Endoscopic Zenker diverticulectomy.Jenne Pane 7/1 Passed swallow   Plan: - Post-op care per CCS, appreciate wound care -ct peg Tfs -adv diet, tube meds to po  Hypophosphatemia/ hypokalemia -  repleted  Mild diarrhea with blood streaks: Small quantity, cdiff negative, Hb stable.  If worsens, will need to change lovenox to SCD.  -  rectal pouch  6/21   INFECTIOUS DZ Cultures:  BCX2 5/14 >>1/2 gram neg rod>>> 1/2 Klebsiella  UC 5/14 >>Enterococcus (sensitive to ampicillin)  Sputum 5/14>> Klebsiella pneumoniae (pan sensitive)  Wound 5/14>>Klebsiella Pneumoniae (pan sensitive) & Enterobacter Cloacae (resistant to ancef, cefoxitin)  BC X2 5/20>>>ng  Sputum 5/22>>>klebs, enterobacter  Sputum 6/7>KLEBSIELLA PNEUMONIAE and STENOTROPHOMONAS MALTOPHILIA.   Antibiotics:  vanc 5/14 >> 5/17  Zosyn 5/13 >>5/28  Vanc 6/9>>>6/11 Ceftaz 6/9 (klebsiella  Sputum and wound)> 6/21 levaquin (stenotrophomonas) 6/11>>>  (6/25)  Wound infection, aspiration pneumonia, UTI.  Plan:  -levaquin 14 days -completed 6/25, off abx    Hypertension-on lisinopril  / norvasc at home.   -Changed to lopressor PO 6/17 (12.5 bid) Plan: cont norvasc (severe COPD)    DISPO  - anticipate continued improvement with PT, needs to be on 28% before dc to SNF, we may have to look into other alternatives since his O2 requirement may remain high      04/20/2012 1:49 PM

## 2012-04-21 ENCOUNTER — Inpatient Hospital Stay (HOSPITAL_COMMUNITY): Payer: Medicare Other

## 2012-04-21 LAB — BASIC METABOLIC PANEL
CO2: 27 mEq/L (ref 19–32)
Chloride: 102 mEq/L (ref 96–112)
Glucose, Bld: 100 mg/dL — ABNORMAL HIGH (ref 70–99)
Sodium: 139 mEq/L (ref 135–145)

## 2012-04-21 LAB — CBC
HCT: 34.5 % — ABNORMAL LOW (ref 39.0–52.0)
Hemoglobin: 10.9 g/dL — ABNORMAL LOW (ref 13.0–17.0)
MCV: 81.6 fL (ref 78.0–100.0)
RBC: 4.23 MIL/uL (ref 4.22–5.81)
WBC: 8.4 10*3/uL (ref 4.0–10.5)

## 2012-04-21 MED ORDER — FUROSEMIDE 8 MG/ML PO SOLN
40.0000 mg | Freq: Every day | ORAL | Status: DC
  Administered 2012-04-22 – 2012-04-25 (×4): 40 mg via ORAL
  Filled 2012-04-21 (×4): qty 5

## 2012-04-21 NOTE — Progress Notes (Signed)
Patient ID: Michael Ellison, male   DOB: 02-08-1946, 66 y.o.   MRN: [id number] Respiratory failure following trauma and surgery  Subjective: Feels good, no complaints. No abd pain, no nausea  Objective: Vital signs in last 24 hours: Temp:  [98.3 F (36.8 C)-98.7 F (37.1 C)] 98.3 F (36.8 C) (07/05 0500) Pulse Rate:  [79-86] 80  (07/05 0500) Resp:  [16-20] 20  (07/05 0500) BP: (105-120)/(70-79) 120/79 mmHg (07/05 0500) SpO2:  [93 %-97 %] 93 % (07/05 0500) FiO2 (%):  [28 %] 28 % (07/05 0500) Last BM Date: 04/20/12  Intake/Output from previous day: 07/04 0701 - 07/05 0700 In: 360 [P.O.:360] Out: 750 [Urine:750] Intake/Output this shift:    General appearance: alert, cooperative, no distress  GI: soft, non-tender; bowel sounds normal; wound improving nicely  Lab Results:  Results for orders placed during the hospital encounter of 02/24/12 (from the past 24 hour(s))  BASIC METABOLIC PANEL     Status: Abnormal   Collection Time   04/21/12  5:15 AM      Component Value Range   Sodium 139  135 - 145 mEq/L   Potassium 3.6  3.5 - 5.1 mEq/L   Chloride 102  96 - 112 mEq/L   CO2 27  19 - 32 mEq/L   Glucose, Bld 100 (*) 70 - 99 mg/dL   BUN 10  6 - 23 mg/dL   Creatinine, Ser 5.63  0.50 - 1.35 mg/dL   Calcium 8.5  8.4 - 87.5 mg/dL   GFR calc non Af Amer >90  >90 mL/min   GFR calc Af Amer >90  >90 mL/min  CBC     Status: Abnormal   Collection Time   04/21/12  5:15 AM      Component Value Range   WBC 8.4  4.0 - 10.5 K/uL   RBC 4.23  4.22 - 5.81 MIL/uL   Hemoglobin 10.9 (*) 13.0 - 17.0 g/dL   HCT 64.3 (*) 32.9 - 51.8 %   MCV 81.6  78.0 - 100.0 fL   MCH 25.8 (*) 26.0 - 34.0 pg   MCHC 31.6  30.0 - 36.0 g/dL   RDW 84.1  66.0 - 63.0 %   Platelets 244  150 - 400 K/uL     Studies/Results Radiology     MEDS, Scheduled    . albuterol  2.5 mg Nebulization QID  . amLODipine  5 mg Per Tube Daily  . antiseptic oral rinse  15 mL Mouth Rinse QID  . budesonide  0.5 mg Nebulization  BID  . chlorhexidine  15 mL Mouth Rinse BID  . enoxaparin  40 mg Subcutaneous Q24H  . escitalopram  10 mg Oral Daily  . feeding supplement  30 mL Per Tube BID  . fentaNYL  100 mcg Intravenous Once  . free water  200 mL Per Tube TID  . furosemide  40 mg Oral BID  . pantoprazole sodium  40 mg Oral QHS  . DISCONTD: furosemide  40 mg Per Tube BID  . DISCONTD: pantoprazole sodium  40 mg Per Tube QHS     Assessment: Respiratory failure following trauma and surgery Improved and OK to SNF from surgical standpoint  Plan:  Continue wound care, diet as tolerated, no changes.    LOS: 57 days   Willaim Mode, [facility], [place] [phone]   04/21/2012 9:40 AM

## 2012-04-21 NOTE — Progress Notes (Signed)
CSW spoke with [facility] and are anticipating patient to transition to facility on Monday pending documentation from respiratory.   .Clinical social worker continuing to follow pt to assist with pt dc plans and further csw needs.   Catha Gosselin, Theresia Majors  [phone] .04/21/2012 1550pm

## 2012-04-21 NOTE — Progress Notes (Signed)
Agree with PA's assessment.  No further problems.  Just to clarify, patient had surgery at outside facility and was transferred here in respiratory failure and subsequently dehisced his abdominal wound.  He has had no trauma.  Marta Lamas. [name], MD, FACS [phone] [facility]

## 2012-04-21 NOTE — Progress Notes (Signed)
Clinical social worker has updated clinicals for patient and faxed to [place] [facility], [place] [facility] and [place] as requested by pt family.   [facility] has requested an updated not from respiratory explaining suctioning patterns, pt Fi02% ambulating and at rest.   CSW spoke with Respiratory who agreed to write a note explaining pt current status.   .Clinical social worker continuing to follow pt to assist with pt dc plans and further csw needs.   Catha Gosselin, [facility] Majors  [phone] .04/21/2012 1343pm

## 2012-04-21 NOTE — Progress Notes (Signed)
Nutrition Follow-up  Intervention:    Continue current EN regimen RD to follow for nutrition care plan  Patient s/p zenker's diverticulectomy 7/1. Remains on trach collar. MSSS 7/3, SLP recommending Regular, thin liquid consistencies. Jevity 1.2 continues at 55 ml/hr with Prostat liquid protein BID via PEG tube providing 1784 total kcals, 104 gm protien, 1065 ml of free water. Free water flushes at 300 ml 3 times daily. Awaiting SNF placement.  Diet Order:  Full Liquid  Meds: Scheduled Meds:   . albuterol  2.5 mg Nebulization QID  . amLODipine  5 mg Per Tube Daily  . antiseptic oral rinse  15 mL Mouth Rinse QID  . budesonide  0.5 mg Nebulization BID  . chlorhexidine  15 mL Mouth Rinse BID  . enoxaparin  40 mg Subcutaneous Q24H  . escitalopram  10 mg Oral Daily  . feeding supplement  30 mL Per Tube BID  . fentaNYL  100 mcg Intravenous Once  . free water  200 mL Per Tube TID  . furosemide  40 mg Oral BID  . pantoprazole sodium  40 mg Oral QHS  . DISCONTD: furosemide  40 mg Per Tube BID  . DISCONTD: pantoprazole sodium  40 mg Per Tube QHS   Continuous Infusions:   . sodium chloride 20 mL/hr (04/20/12 1450)  . feeding supplement (JEVITY 1.2 CAL) 1,000 mL (04/18/12 1101)   PRN Meds:.albuterol, ALPRAZolam, bisacodyl, fentaNYL, HYDROcodone-acetaminophen, morphine injection, phenol, silver nitrate applicators, DISCONTD: ALPRAZolam  Labs:  CMP     Component Value Date/Time   NA 139 04/21/2012 0515   K 3.6 04/21/2012 0515   CL 102 04/21/2012 0515   CO2 27 04/21/2012 0515   GLUCOSE 100* 04/21/2012 0515   BUN 10 04/21/2012 0515   CREATININE 0.58 04/21/2012 0515   CALCIUM 8.5 04/21/2012 0515   PROT 6.1 03/29/2012 0420   ALBUMIN 2.0* 04/13/2012 0425   AST 13 03/29/2012 0420   ALT 10 03/29/2012 0420   ALKPHOS 49 03/29/2012 0420   BILITOT 0.3 03/29/2012 0420   GFRNONAA >90 04/21/2012 0515   GFRAA >90 04/21/2012 0515     Intake/Output Summary (Last 24 hours) at 04/21/12 0932 Last data filed at  04/21/12 0139  Gross per 24 hour  Intake    240 ml  Output    450 ml  Net   -210 ml    Weight Status:  71.9 kg (7/4) -- stable  Re-estimated needs:  1750-1850 kcals, 90-110 gm protein  Nutrition Dx:  Inadequate Oral Intake, ongoing  Goal:  EN to meet >90% of estimated nutrition needs, met  Monitor:  EN regimen/tolerance, weight, labs, I/O's  Alger Memos, RD, LDN Pager #: [phone]

## 2012-04-21 NOTE — Significant Event (Signed)
Trach changed to new #6 XLT proximal (cuffed) trach. Pt tolerated well w/ some expected blood tinged secretions after change.   Plan: -cont pulm hygiene -have asked to see if cuffless XLTs are available. Also will discuss w/ Dr [name] re: opinion on need for XLT as no longer ventilated. We may be able to use a cuffless standard 6 shiley if the #6 XLT cuffless is not available.  -concerned about sending a cuffed trach to a SNF  [name] ACNP-BC [facility] Pulmonary/Critical Care Pager # [phone] OR # [phone] if no answer

## 2012-04-21 NOTE — Progress Notes (Signed)
Called to patient room to suction patient. Pt sats remain 95-99% and pt appears to be in no distress. Pt ask to be suctioned for comfort. Very minimal white, thick sputum returned. Will continue to monitor patient closely. Ramond Craver, RN

## 2012-04-21 NOTE — Progress Notes (Signed)
Name: Michael Ellison MRN: [id number] DOB: July 29, 1946    LOS: 57 Date of admit 02/24/2012  8:34 AM  PULMONARY / CRITICAL CARE MEDICINE  Pt Profile:   66 y/o male smoker admitted to [facility] 02/24/2012 with n/v and abdominal pain due to SBO due to adhesions.  Had ex lap with SB resection 5/10.  Developed delirium post-op.  Transferred to [facility] 5/14 for respiratory failure requiring intubation.  Developed abdominal fascial dehiscence and taken back to OR 5/16.  Failed extubation, and required tracheostomy 5/22. Weaned to ATC, s/p Endoscopic Zenker diverticulectomy.  PMHx ETOH, HTN, Prostate cancer  Interval hx:  5/16: OR for fascial dehiscence repair- closed with wound vac 5/18- failed weaning 5/19- extubated 5/21- re-intubated for hypercarbia 5/22-improved co2 and neurostatus, pos almost 2 liters 5/22 SVT>>resolved spontaneously 5/30 -off vent for 24 hours, planned Panda placement 6/1 - up in chair, improved. 6/3 - desat, increased WOB, vent 6/3 - leak trach, improved with extra long 6/5 - FEES eval: failed, still showing evidence of aspiration.  6/16 - Few liquid stools with streaks of blood,  H/h stable.  (has been having mucoid / bloody streaked stool -surgeons aware) 6/18 Perc gastrostomy per IR 6/21 started 24 h T collar 6/25 - still on TC. Did PMV 7/1 Endoscopic Zenker diverticulectomy.   Subjective:very happt that he is finally Taking po, satn 97% on 28% ATC Vital Signs: Temp:  [98.3 F (36.8 C)-98.7 F (37.1 C)] 98.3 F (36.8 C) (07/05 0500) Pulse Rate:  [79-86] 80  (07/05 0500) Resp:  [16-20] 20  (07/05 0500) BP: (105-120)/(70-79) 120/79 mmHg (07/05 0500) SpO2:  [93 %-97 %] 97 % (07/05 0900) FiO2 (%):  [28 %] 28 % (07/05 0900)  Physical Examination: General - Alert and interactive. HEENT - trach site clean, #6.0 xlt Cardiac - regular, no m/r/g Chest -a few scattered crackles, no wheezing, good airflow x decreased L Base. Abd - round/soft, bs+ Ext - no edema Neuro -  awake and interacting, moves all ext. Talking. Looking stronger  Intake/Output Summary (Last 24 hours) at 04/21/12 1012 Last data filed at 04/21/12 0930  Gross per 24 hour  Intake    240 ml  Output    650 ml  Net   -410 ml   CBC  Lab 04/21/12 0515 04/17/12 0346  HGB 10.9* 11.9*  HCT 34.5* 36.4*  WBC 8.4 8.8  PLT 244 258   BMET  Lab 04/21/12 0515 04/20/12 0540 04/17/12 0346 04/15/12 0339  NA 139 139 135 138  K 3.6 3.6 -- --  CL 102 104 99 102  CO2 27 28 28 27   GLUCOSE 100* 111* 85 94  BUN 10 13 14 13   CREATININE 0.58 0.61 0.59 0.52  CALCIUM 8.5 8.4 8.9 8.5  MG -- -- -- --  PHOS -- -- -- --   Lab Results  Component Value Date   ALT 10 03/29/2012   AST 13 03/29/2012   ALKPHOS 49 03/29/2012   BILITOT 0.3 03/29/2012    Intake/Output Summary (Last 24 hours) at 04/21/12 1012 Last data filed at 04/21/12 0930  Gross per 24 hour  Intake    240 ml  Output    650 ml  Net   -410 ml   pcxr 6/30 Bibasilar atx   - improved   ASSESSMENT AND PLAN  Acute hypoxic/hypercapnic respiratory failure 2nd to delirium, aspiration pneumonia, 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  s/p trach 5/22. Xtra long 6 cuffed 6/3>>>  Persistent hypoxia  due to COPD & secretions  Plan: -Cont scheduled BDs, continue ICS -TC as tolerated 24 h per daysince  6/21,now that drop in FIO2 to 28% , CAN  tr to SNF  -6/30  gentle diuresis with lasix (>40L POS) -Cont vibravest. -pmv as tolerated -XLT trach can be changed prior to dc     SBO s/p lap with SB resection 5/10 complicated by post-op ileus and fascial dehiscence, and return to OR 5/16. Chronic pain. Dysphagia/ Protein calorie malnutrition MBS 6/26 >>primary severe cervical esophageal dysphagia with a large Zenker's Diverticulum confirmed by radiologist - Endoscopic Zenker diverticulectomy.Jenne Pane 7/1 Passed swallow >> on PO   Plan: - Post-op care per CCS, appreciate wound care -ct peg Tfs -adv diet, tube meds to po, ct to  supplement PEG feeds, can change to bolus feeds at SNF  Hypophosphatemia/ hypokalemia - repleted  Mild diarrhea with blood streaks: Small quantity, cdiff negative, Hb stable. -  rectal pouch  6/21 -ct lovenox until ambulatory   INFECTIOUS DZ Cultures:  BCX2 5/14 >>1/2 gram neg rod>>> 1/2 Klebsiella  UC 5/14 >>Enterococcus (sensitive to ampicillin)  Sputum 5/14>> Klebsiella pneumoniae (pan sensitive)  Wound 5/14>>Klebsiella Pneumoniae (pan sensitive) & Enterobacter Cloacae (resistant to ancef, cefoxitin)  BC X2 5/20>>>ng  Sputum 5/22>>>klebs, enterobacter  Sputum 6/7>KLEBSIELLA PNEUMONIAE and STENOTROPHOMONAS MALTOPHILIA.   Antibiotics:  vanc 5/14 >> 5/17  Zosyn 5/13 >>5/28  Vanc 6/9>>>6/11 Ceftaz 6/9 (klebsiella  Sputum and wound)> 6/21 levaquin (stenotrophomonas) 6/11>>>  (6/25)  Wound infection, aspiration pneumonia, UTI.  Plan:  -levaquin 14 days -completed 6/25, off abx    Hypertension-on lisinopril  / norvasc at home.   -Changed to lopressor PO 6/17 (12.5 bid) Plan: cont norvasc (severe COPD)    DISPO  - anticipate continued improvement with PT Medically ready now for dc to SNF  Cyril Mourning MD. FCCP. [facility] Pager [phone] If no response call [phone]    04/21/2012 10:12 AM

## 2012-04-21 NOTE — Progress Notes (Signed)
Physical Therapy Treatment Patient Details Name: WREN GALLAGA MRN: [id number] DOB: 05/09/1946 Today's Date: 04/21/2012 Time: 0832-0901 PT Time Calculation (min): 29 min  PT Assessment / Plan / Recommendation Comments on Treatment Session  Patient continues to improve his functional mobility.    Follow Up Recommendations  Skilled nursing facility    Barriers to Discharge  None      Equipment Recommendations  Defer to next venue    Recommendations for Other Services  None  Frequency Min 2X/week   Plan Discharge plan remains appropriate;Frequency remains appropriate    Precautions / Restrictions Precautions Precautions: Fall   Pertinent Vitals/Pain Trach collar with gait 40% today. SpO2 94-100% with activity.    Mobility  Bed Mobility Supine to Sit: 6: Modified independent (Device/Increase time) Sitting - Scoot to Edge of Bed: 6: Modified independent (Device/Increase time) Transfers Sit to Stand: 5: Supervision;With upper extremity assist;From bed;From chair/3-in-1 Stand to Sit: 5: Supervision;With upper extremity assist;To chair/3-in-1 Details for Transfer Assistance: Increased difficulty standing from lower surface. Correct hand placement to and from device without cueing.  Ambulation/Gait Ambulation/Gait Assistance: 5: Supervision Ambulation Distance (Feet): 450 Feet Assistive device: Rolling walker Ambulation/Gait Assistance Details: Trach collar at 40% with SpO2 94%. Improved speed and efficiency noted today with cueing but not formally tested.     Exercises General Exercises - Lower Extremity Hip Flexion/Marching: AROM;Both;20 reps;Standing Heel Raises: AROM;Both;20 reps;Standing Mini-Sqauts: AROM;Both;20 reps;Standing Other Exercises Other Exercises: Hip extension in standing x 10 each bil. Patient fatigued, so stopped to rest.     PT Goals Acute Rehab PT Goals PT Goal: Supine/Side to Sit - Progress: Progressing toward goal PT Goal: Sit at Edge Of Bed -  Progress: Met PT Goal: Sit to Stand - Progress: Progressing toward goal PT Goal: Stand to Sit - Progress: Progressing toward goal PT Goal: Ambulate - Progress: Progressing toward goal PT Goal: Perform Home Exercise Program - Progress: Progressing toward goal  Visit Information  Last PT Received On: 04/21/12 Assistance Needed: +1    Subjective Data  Subjective: Patient reports getting better Patient Stated Goal: Home but knows that going to a rehab facility may have to be an option   Cognition  Overall Cognitive Status: Appears within functional limits for tasks assessed/performed Difficult to assess due to: Tracheostomy Arousal/Alertness: Awake/alert Orientation Level: Appears intact for tasks assessed Behavior During Session: [facility] for tasks performed    Balance  High Level Balance High Level Balance Comments: with walker and without evidenc eof imbalance with turns and negotiating spaces. Also reached to open door without difficulty (one arm resting on walker).   End of Session PT - End of Session Equipment Utilized During Treatment: Gait belt Activity Tolerance: Patient tolerated treatment well Patient left: in chair;with call bell/phone within reach Nurse Communication: Mobility status    Edwyna Perfect, PT  Pager [phone]  04/21/2012, 9:07 AM

## 2012-04-22 DIAGNOSIS — I10 Essential (primary) hypertension: Secondary | ICD-10-CM

## 2012-04-22 NOTE — Progress Notes (Signed)
Name: Michael Ellison MRN: [id number] DOB: May 03, 1946    LOS: 58 Date of admit 02/24/2012  8:34 AM  PULMONARY / CRITICAL CARE MEDICINE  Pt Profile:   66 y/o male smoker admitted to [facility] 02/24/2012 with n/v and abdominal pain due to SBO due to adhesions.  Had ex lap with SB resection 5/10.  Developed delirium post-op.  Transferred to [facility] 5/14 for respiratory failure requiring intubation.  Developed abdominal fascial dehiscence and taken back to OR 5/16.  Failed extubation, and required tracheostomy 5/22. Weaned to ATC, s/p Endoscopic Zenker diverticulectomy.  PMHx ETOH, HTN, Prostate cancer  Interval hx:  5/16: OR for fascial dehiscence repair- closed with wound vac 5/18- failed weaning 5/19- extubated 5/21- re-intubated for hypercarbia 5/22-improved co2 and neurostatus, pos almost 2 liters 5/22 SVT>>resolved spontaneously 5/30 -off vent for 24 hours, planned Panda placement 6/1 - up in chair, improved. 6/3 - desat, increased WOB, vent 6/3 - leak trach, improved with extra long 6/5 - FEES eval: failed, still showing evidence of aspiration.  6/16 - Few liquid stools with streaks of blood,  H/h stable.  (has been having mucoid / bloody streaked stool -surgeons aware) 6/18 Perc gastrostomy per IR 6/21 started 24 h T collar 6/25 - still on TC. Did PMV 7/1 Endoscopic Zenker diverticulectomy.  Subjective: No new issues overnight.  On tc with 28% and no increased wob.  Vital Signs: Temp:  [97.7 F (36.5 C)-98.7 F (37.1 C)] 97.7 F (36.5 C) (07/06 0500) Pulse Rate:  [78-101] 95  (07/06 1050) Resp:  [18-20] 19  (07/06 0812) BP: (107-127)/(70-83) 107/70 mmHg (07/06 1050) SpO2:  [93 %-98 %] 98 % (07/06 0812) FiO2 (%):  [28 %] 28 % (07/06 0812) Weight:  [71.2 kg (156 lb 15.5 oz)] 71.2 kg (156 lb 15.5 oz) (07/06 0500)  Physical Examination: General - Alert and interactive. HEENT - trach site clean, #6.0 xlt Cardiac - regular, no m/r/g Chest -good airflow, no wheezing, a few basilar  crackles. Abd - round/soft, bs+ Ext - no edema Neuro - awake and interacting, moves all ext. Talking. Looking stronger  Intake/Output Summary (Last 24 hours) at 04/22/12 1102 Last data filed at 04/22/12 0900  Gross per 24 hour  Intake      0 ml  Output   1775 ml  Net  -1775 ml   CBC  Lab 04/21/12 0515 04/17/12 0346  HGB 10.9* 11.9*  HCT 34.5* 36.4*  WBC 8.4 8.8  PLT 244 258   BMET  Lab 04/21/12 0515 04/20/12 0540 04/17/12 0346  NA 139 139 135  K 3.6 3.6 --  CL 102 104 99  CO2 27 28 28   GLUCOSE 100* 111* 85  BUN 10 13 14   CREATININE 0.58 0.61 0.59  CALCIUM 8.5 8.4 8.9  MG -- -- --  PHOS -- -- --   Lab Results  Component Value Date   ALT 10 03/29/2012   AST 13 03/29/2012   ALKPHOS 49 03/29/2012   BILITOT 0.3 03/29/2012    Intake/Output Summary (Last 24 hours) at 04/22/12 1102 Last data filed at 04/22/12 0900  Gross per 24 hour  Intake      0 ml  Output   1775 ml  Net  -1775 ml   pcxr 6/30 Bibasilar atx   - improved   ASSESSMENT AND PLAN  Acute hypoxic/hypercapnic respiratory failure 2nd to delirium, aspiration pneumonia, 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  s/p trach 5/22. Xtra long 6 cuffed 6/3>>>  Persistent  hypoxia due to COPD & secretions  Plan: -Cont scheduled BDs, continue ICS -TC as tolerated 24 h per daysince  6/21,now that drop in FIO2 to 28% , CAN  tr to SNF   SBO s/p lap with SB resection 5/10 complicated by post-op ileus and fascial dehiscence, and return to OR 5/16. Chronic pain. Dysphagia/ Protein calorie malnutrition MBS 6/26 >>primary severe cervical esophageal dysphagia with a large Zenker's Diverticulum confirmed by radiologist - Endoscopic Zenker diverticulectomy.Jenne Pane 7/1 Passed swallow >> on PO   Plan: - Post-op care per CCS, appreciate wound care -ct peg Tfs -adv diet, tube meds to po, ct to supplement PEG feeds, can change to bolus feeds at SNF   Mild diarrhea with blood streaks: Small quantity, cdiff  negative, Hb stable. -  rectal pouch  6/21 -ct lovenox until ambulatory    Hypertension-on lisinopril  / norvasc at home.   -Changed to lopressor PO 6/17 (12.5 bid) Plan: cont norvasc (severe COPD)    DISPO  - anticipate continued improvement with PT Medically ready now for dc to SNF  Barbaraann Share, M.D. 04/22/2012, 11:03 AM   04/22/2012 11:02 AM

## 2012-04-23 NOTE — Progress Notes (Signed)
Name: Michael Ellison MRN: [id number] DOB: 09-03-1946    LOS: 59 Date of admit 02/24/2012  8:34 AM  PULMONARY / CRITICAL CARE MEDICINE  Pt Profile:   66 y/o male smoker admitted to [facility] 02/24/2012 with n/v and abdominal pain due to SBO due to adhesions.  Had ex lap with SB resection 5/10.  Developed delirium post-op.  Transferred to [facility] 5/14 for respiratory failure requiring intubation.  Developed abdominal fascial dehiscence and taken back to OR 5/16.  Failed extubation, and required tracheostomy 5/22. Weaned to ATC, s/p Endoscopic Zenker diverticulectomy.  PMHx ETOH, HTN, Prostate cancer  Interval hx:  5/16: OR for fascial dehiscence repair- closed with wound vac 5/18- failed weaning 5/19- extubated 5/21- re-intubated for hypercarbia 5/22-improved co2 and neurostatus, pos almost 2 liters 5/22 SVT>>resolved spontaneously 5/30 -off vent for 24 hours, planned Panda placement 6/1 - up in chair, improved. 6/3 - desat, increased WOB, vent 6/3 - leak trach, improved with extra long 6/5 - FEES eval: failed, still showing evidence of aspiration.  6/16 - Few liquid stools with streaks of blood,  H/h stable.  (has been having mucoid / bloody streaked stool -surgeons aware) 6/18 Perc gastrostomy per IR 6/21 started 24 h T collar 6/25 - still on TC. Did PMV 7/1 Endoscopic Zenker diverticulectomy.  Subjective: No new issues overnight.  On tc with 28% and no increased wob.  Denies cough or congestion  Vital Signs: Temp:  [98 F (36.7 C)-98.9 F (37.2 C)] 98.1 F (36.7 C) (07/07 0600) Pulse Rate:  [78-96] 79  (07/07 0600) Resp:  [16-20] 17  (07/07 0600) BP: (121-134)/(60-98) 122/60 mmHg (07/07 1003) SpO2:  [96 %-100 %] 99 % (07/07 0712) FiO2 (%):  [28 %] 28 % (07/07 2952)  Physical Examination: General - Alert and interactive. HEENT - trach site clean, #6.0 xlt Cardiac - regular, no m/r/g Chest -good airflow, no wheezing, a few basilar crackles. Abd - round/soft, bs+ Ext - no  edema Neuro - awake and interacting, moves all ext. Talking.  Intake/Output Summary (Last 24 hours) at 04/23/12 1137 Last data filed at 04/23/12 0900  Gross per 24 hour  Intake   1385 ml  Output   1050 ml  Net    335 ml   CBC  Lab 04/21/12 0515 04/17/12 0346  HGB 10.9* 11.9*  HCT 34.5* 36.4*  WBC 8.4 8.8  PLT 244 258   BMET  Lab 04/21/12 0515 04/20/12 0540 04/17/12 0346  NA 139 139 135  K 3.6 3.6 --  CL 102 104 99  CO2 27 28 28   GLUCOSE 100* 111* 85  BUN 10 13 14   CREATININE 0.58 0.61 0.59  CALCIUM 8.5 8.4 8.9  MG -- -- --  PHOS -- -- --   Lab Results  Component Value Date   ALT 10 03/29/2012   AST 13 03/29/2012   ALKPHOS 49 03/29/2012   BILITOT 0.3 03/29/2012    Intake/Output Summary (Last 24 hours) at 04/23/12 1137 Last data filed at 04/23/12 0900  Gross per 24 hour  Intake   1385 ml  Output   1050 ml  Net    335 ml   pcxr 6/30 Bibasilar atx   - improved   ASSESSMENT AND PLAN  Acute hypoxic/hypercapnic respiratory failure 2nd to delirium, aspiration pneumonia, 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  s/p trach 5/22. Xtra long 6 cuffed 6/3>>>  Persistent hypoxia due to COPD & secretions  Plan: -Cont scheduled BDs, continue ICS -TC as  tolerated 24 h per daysince  6/21,now that drop in FIO2 to 28% , CAN  tr to SNF   SBO s/p lap with SB resection 5/10 complicated by post-op ileus and fascial dehiscence, and return to OR 5/16. Chronic pain. Dysphagia/ Protein calorie malnutrition MBS 6/26 >>primary severe cervical esophageal dysphagia with a large Zenker's Diverticulum confirmed by radiologist - Endoscopic Zenker diverticulectomy.Jenne Pane 7/1 Passed swallow >> on PO   Plan: - Post-op care per CCS, appreciate wound care -ct peg Tfs -adv diet, tube meds to po, ct to supplement PEG feeds, can change to bolus feeds at SNF   Mild diarrhea with blood streaks: Small quantity, cdiff negative, Hb stable. -  rectal pouch  6/21 -ct lovenox  until ambulatory    Hypertension-on lisinopril  / norvasc at home.   -Changed to lopressor PO 6/17 (12.5 bid) Plan: cont norvasc (severe COPD)    DISPO  - anticipate continued improvement with PT Medically ready now for dc to SNF  Barbaraann Share, M.D. 04/23/2012, 11:37 AM   04/23/2012 11:37 AM

## 2012-04-24 ENCOUNTER — Encounter (HOSPITAL_COMMUNITY): Payer: Self-pay | Admitting: Cardiology

## 2012-04-24 DIAGNOSIS — R1310 Dysphagia, unspecified: Secondary | ICD-10-CM

## 2012-04-24 MED ORDER — ENSURE PUDDING PO PUDG
1.0000 | Freq: Three times a day (TID) | ORAL | Status: DC
  Administered 2012-04-24 – 2012-04-25 (×3): 1 via ORAL

## 2012-04-24 MED ORDER — JEVITY 1.2 CAL PO LIQD: 1000.0000 mL | ORAL | Status: DC

## 2012-04-24 NOTE — Progress Notes (Signed)
Nutrition Consult/Follow-up  Intervention:    Calorie count x 48 hours   Continue Ensure Pudding 3 times daily between meals RD to follow for nutrition care plan  RD consult for calorie count. Patient was on Full Liquids, advanced to Heart Healthy this AM. Spoke with RN, patient consuming 100% at meals. EN with Jevity 1.2 formula discontinued via PEG tube. Noted discharge plan for tomorrow 7/9.  Diet Order:  Heart Healthy  Meds: Scheduled Meds:   . albuterol  2.5 mg Nebulization QID  . amLODipine  5 mg Per Tube Daily  . antiseptic oral rinse  15 mL Mouth Rinse QID  . budesonide  0.5 mg Nebulization BID  . chlorhexidine  15 mL Mouth Rinse BID  . enoxaparin  40 mg Subcutaneous Q24H  . escitalopram  10 mg Oral Daily  . feeding supplement  1 Container Oral TID BM  . feeding supplement  30 mL Per Tube BID  . free water  200 mL Per Tube TID  . furosemide  40 mg Oral Daily  . pantoprazole sodium  40 mg Oral QHS   Continuous Infusions:   . sodium chloride 20 mL/hr (04/20/12 1450)  . DISCONTD: feeding supplement (JEVITY 1.2 CAL) 1,000 mL (04/22/12 1104)  . DISCONTD: feeding supplement (JEVITY 1.2 CAL) Stopped (04/24/12 1149)   PRN Meds:.albuterol, ALPRAZolam, bisacodyl, fentaNYL, HYDROcodone-acetaminophen, morphine injection, phenol, silver nitrate applicators  Labs:  CMP     Component Value Date/Time   NA 139 04/21/2012 0515   K 3.6 04/21/2012 0515   CL 102 04/21/2012 0515   CO2 27 04/21/2012 0515   GLUCOSE 100* 04/21/2012 0515   BUN 10 04/21/2012 0515   CREATININE 0.58 04/21/2012 0515   CALCIUM 8.5 04/21/2012 0515   PROT 6.1 03/29/2012 0420   ALBUMIN 2.0* 04/13/2012 0425   AST 13 03/29/2012 0420   ALT 10 03/29/2012 0420   ALKPHOS 49 03/29/2012 0420   BILITOT 0.3 03/29/2012 0420   GFRNONAA >90 04/21/2012 0515   GFRAA >90 04/21/2012 0515     Intake/Output Summary (Last 24 hours) at 04/24/12 1434 Last data filed at 04/24/12 1300  Gross per 24 hour  Intake    720 ml  Output   2040 ml  Net   -1320 ml    Weight Status:  71.2 kg (7/6) -- stable  Re-estimated needs:  1750-1850 kcals, 90-110 gm protein  Nutrition Dx:  Inadequate Oral Intake, resolved  Goal:  Oral intake to meet >90% of estimated nutrition needs, progressing  Monitor:  Calorie count results, weight, labs, I/O's   Alger Memos, RD, LDN Pager #: [phone]

## 2012-04-24 NOTE — Progress Notes (Addendum)
Per report, pt is requiring suctioning via trach about twice a shift. Secretions are thin and from the upper airway.  Pt has residual of 210 cc at 10:30 am. Reports feeling bloated. Feedings held and MD paged.

## 2012-04-24 NOTE — Progress Notes (Signed)
Name: Michael Ellison MRN: [id number] DOB: 06-09-1946    LOS: 60 Date of admit 02/24/2012  8:34 AM  PULMONARY / CRITICAL CARE MEDICINE  Pt Profile:   66 y/o male smoker admitted to [facility] 02/24/2012 with n/v and abdominal pain due to SBO due to adhesions.  Had ex lap with SB resection 5/10.  Developed delirium post-op.  Transferred to [facility] 5/14 for respiratory failure requiring intubation.  Developed abdominal fascial dehiscence and taken back to OR 5/16.  Failed extubation, and required tracheostomy 5/22. Weaned to ATC, s/p Endoscopic Zenker diverticulectomy.  PMHx ETOH, HTN, Prostate cancer  Interval hx:  5/16: OR for fascial dehiscence repair- closed with wound vac 5/18- failed weaning 5/19- extubated 5/21- re-intubated for hypercarbia 5/22-improved co2 and neurostatus, pos almost 2 liters 5/22 SVT>>resolved spontaneously 5/30 -off vent for 24 hours, planned Panda placement 6/1 - up in chair, improved. 6/3 - desat, increased WOB, vent 6/3 - leak trach, improved with extra long 6/5 - FEES eval: failed, still showing evidence of aspiration.  6/16 - Few liquid stools with streaks of blood,  H/h stable.  (has been having mucoid / bloody streaked stool -surgeons aware) 6/18 Perc gastrostomy per IR 6/21 started 24 h T collar 6/25 - still on TC. Did PMV 7/1 Endoscopic Zenker diverticulectomy. 7/5 - Trach change 7/8 - tolerating liquid diet, remains on full dose TF -->reduce TF rate, allow regular food   Subjective: No new issues overnight.  On tc with 28% and no increased wob.  Denies cough or congestion  Vital Signs: Temp:  [98.1 F (36.7 C)-98.2 F (36.8 C)] 98.1 F (36.7 C) (07/08 0600) Pulse Rate:  [69-93] 69  (07/08 1027) Resp:  [15-20] 15  (07/08 0600) BP: (109-125)/(68-81) 125/80 mmHg (07/08 1027) SpO2:  [95 %-100 %] 98 % (07/08 0916) FiO2 (%):  [28 %] 28 % (07/08 0916)  Physical Examination: General - Alert and interactive. HEENT - trach site clean, #6.0 xlt,  cuffed Cardiac - regular, no m/r/g Chest -good airflow, no wheezing, a few basilar crackles. Abd - round/soft, bs+ Ext - no edema Neuro - awake and interacting, moves all ext. Talking.  Intake/Output Summary (Last 24 hours) at 04/24/12 1127 Last data filed at 04/24/12 1000  Gross per 24 hour  Intake    960 ml  Output   1590 ml  Net   -630 ml   CBC  Lab 04/21/12 0515  HGB 10.9*  HCT 34.5*  WBC 8.4  PLT 244   BMET  Lab 04/21/12 0515 04/20/12 0540  NA 139 139  K 3.6 3.6  CL 102 104  CO2 27 28  GLUCOSE 100* 111*  BUN 10 13  CREATININE 0.58 0.61  CALCIUM 8.5 8.4  MG -- --  PHOS -- --   Lab Results  Component Value Date   ALT 10 03/29/2012   AST 13 03/29/2012   ALKPHOS 49 03/29/2012   BILITOT 0.3 03/29/2012    Intake/Output Summary (Last 24 hours) at 04/24/12 1127 Last data filed at 04/24/12 1000  Gross per 24 hour  Intake    960 ml  Output   1590 ml  Net   -630 ml   pcxr 6/30 Bibasilar atx   - improved   ASSESSMENT AND PLAN  Acute hypoxic/hypercapnic respiratory failure 2nd to delirium, aspiration pneumonia, 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  s/p trach 5/22. Xtra long 6 cuffed 6/3>>> changed 7/5 Persistent hypoxia due to COPD & secretions  Plan: -Cont scheduled  BDs, continue ICS -TC as tolerated 24 h per daysince  6/21,now that drop in FIO2 to 28% , CAN  tr to SNF  -will need to change to uncuffed trach to tx to SNF  SBO s/p lap with SB resection 5/10 complicated by post-op ileus and fascial dehiscence, and return to OR 5/16. Chronic pain. Dysphagia/ Protein calorie malnutrition MBS 6/26 >>primary severe cervical esophageal dysphagia with a large Zenker's Diverticulum confirmed by radiologist - Endoscopic Zenker diverticulectomy.Jenne Pane 7/1 - Passed swallow >> on PO  Plan: -Post-op care per CCS, appreciate wound care - stop TF rate, allow PO diet, if not tolerated transition to bolus feeds - calorie count to ensure adequate PO  intake, ensure  Mild diarrhea with blood streaks: Small quantity, cdiff negative, Hb stable. -  rectal pouch  6/21--off, no diarrhea - ct lovenox until ambulatory   Hypertension-on lisinopril  / norvasc at home.   -Changed to lopressor PO 6/17 (12.5 bid)  Plan:  cont norvasc (severe COPD)    DISPO - anticipate continued improvement with PT - Medically ready now for dc to SNF  [name], NP-C [facility] Pgr: [phone] or [phone]    04/24/2012 11:27 AM     Billy Fischer, MD ; [facility] service Mobile [phone].  After 5:30 PM or weekends, call [phone]

## 2012-04-24 NOTE — Progress Notes (Signed)
CSW spoke with [facility] and patient has been accepted to facility. [facility] is ordering a 10L oxygen tank to accomodate pt trach collar needs as well as 6mm XLT uncuffed trach collar. Per discussion with medical team, pt anticipated discharge is for tomorrow 7/9 pending pt tolerates diet.   Catha Gosselin, Theresia Majors  [phone] .04/24/2012 1355pm

## 2012-04-24 NOTE — Progress Notes (Signed)
Patient ID: Michael Ellison, male   DOB: [birth year], 66 y.o.   MRN: [id number] 7 Days Post-Op  Subjective: No complaints Tolerating wound care  Objective: Vital signs in last 24 hours: Temp:  [98.1 F (36.7 C)-98.2 F (36.8 C)] 98.1 F (36.7 C) (07/08 0600) Pulse Rate:  [74-93] 76  (07/08 0600) Resp:  [15-20] 15  (07/08 0600) BP: (109-122)/(60-81) 109/79 mmHg (07/08 0600) SpO2:  [95 %-100 %] 98 % (07/08 0600) FiO2 (%):  [28 %] 28 % (07/08 0600) Last BM Date: 04/22/12  Intake/Output from previous day: 07/07 0701 - 07/08 0700 In: 1200 [P.O.:1200] Out: 1310 [Urine:1100; Drains:210] Intake/Output this shift:    PE: wound clean with no purulence  Lab Results:  No results found for this basename: WBC:2,HGB:2,HCT:2,PLT:2 in the last 72 hours BMET No results found for this basename: NA:2,K:2,CL:2,CO2:2,GLUCOSE:2,BUN:2,CREATININE:2,CALCIUM:2 in the last 72 hours PT/INR No results found for this basename: LABPROT:2,INR:2 in the last 72 hours Comprehensive Metabolic Panel:    Component Value Date/Time   NA 139 04/21/2012 0515   K 3.6 04/21/2012 0515   CL 102 04/21/2012 0515   CO2 27 04/21/2012 0515   BUN 10 04/21/2012 0515   CREATININE 0.58 04/21/2012 0515   GLUCOSE 100* 04/21/2012 0515   CALCIUM 8.5 04/21/2012 0515   AST 13 03/29/2012 0420   ALT 10 03/29/2012 0420   ALKPHOS 49 03/29/2012 0420   BILITOT 0.3 03/29/2012 0420   PROT 6.1 03/29/2012 0420   ALBUMIN 2.0* 04/13/2012 0425     Studies/Results: No results found.  Anti-infectives: Anti-infectives     Start     Dose/Rate Route Frequency Ordered Stop   03/28/12 1500   levofloxacin (LEVAQUIN) IVPB 750 mg        750 mg 100 mL/hr over 90 Minutes Intravenous Every 24 hours 03/28/12 1348 04/11/12 1642   03/27/12 1600   vancomycin (VANCOCIN) 1,250 mg in sodium chloride 0.9 % 250 mL IVPB  Status:  Discontinued        1,250 mg 166.7 mL/hr over 90 Minutes Intravenous Every 12 hours 03/27/12 0541 03/28/12 1348   03/25/12 0600   vancomycin  (VANCOCIN) 750 mg in sodium chloride 0.9 % 150 mL IVPB  Status:  Discontinued        750 mg 150 mL/hr over 60 Minutes Intravenous Every 12 hours 03/24/12 1423 03/27/12 0541   03/24/12 1600   vancomycin (VANCOCIN) 1,500 mg in sodium chloride 0.9 % 500 mL IVPB        1,500 mg 250 mL/hr over 120 Minutes Intravenous  Once 03/24/12 1423 03/24/12 1856   03/24/12 1530   cefTAZidime (FORTAZ) 1 g in dextrose 5 % 50 mL IVPB  Status:  Discontinued        1 g 100 mL/hr over 30 Minutes Intravenous Every 8 hours 03/24/12 1423 04/07/12 1005   03/24/12 1400   vancomycin (VANCOCIN) IVPB 1000 mg/200 mL premix  Status:  Discontinued        1,000 mg 200 mL/hr over 60 Minutes Intravenous Every 12 hours 03/24/12 1353 03/24/12 1400   03/03/12 1800   vancomycin (VANCOCIN) 1,750 mg in sodium chloride 0.9 % 500 mL IVPB  Status:  Discontinued        1,750 mg 250 mL/hr over 120 Minutes Intravenous Every 12 hours 03/03/12 0628 03/03/12 0918   02/29/12 1900   micafungin (MYCAMINE) 100 mg in sodium chloride 0.9 % 100 mL IVPB  Status:  Discontinued        100 mg 100  mL/hr over 1 Hours Intravenous Every 24 hours 02/29/12 1754 03/01/12 0933   02/29/12 1730   vancomycin (VANCOCIN) 1,250 mg in sodium chloride 0.9 % 250 mL IVPB  Status:  Discontinued        1,250 mg 166.7 mL/hr over 90 Minutes Intravenous Every 12 hours 02/29/12 1644 03/03/12 0628   02/29/12 1715   vancomycin (VANCOCIN) IVPB 1000 mg/200 mL premix  Status:  Discontinued        1,000 mg 200 mL/hr over 60 Minutes Intravenous  Once 02/29/12 1637 02/29/12 1647   02/29/12 1645   piperacillin-tazobactam (ZOSYN) IVPB 3.375 g  Status:  Discontinued        3.375 g 100 mL/hr over 30 Minutes Intravenous  Once 02/29/12 1637 02/29/12 1641   02/28/12 1800   piperacillin-tazobactam (ZOSYN) IVPB 3.375 g  Status:  Discontinued        3.375 g 12.5 mL/hr over 240 Minutes Intravenous 4 times per day 02/28/12 1454 02/28/12 1510   02/28/12 1800    piperacillin-tazobactam (ZOSYN) IVPB 3.375 g  Status:  Discontinued        3.375 g 12.5 mL/hr over 240 Minutes Intravenous Every 8 hours 02/28/12 1510 03/14/12 1700   02/25/12 1109   dextrose 5 % with cefOXitin (MEFOXIN) ADS Med     Comments: HOLLIE, CHRISTINA: cabinet override         02/25/12 1109 02/25/12 2314   02/25/12 1100   cefOXitin (MEFOXIN) 1 g in dextrose 5 % 50 mL IVPB  Status:  Discontinued        1 g 100 mL/hr over 30 Minutes Intravenous 60 min pre-op 02/25/12 1100 02/25/12 1542          Assessment Principal Problem:  *Respiratory failure following trauma and surgery Active Problems:  Aspiration pneumonia  Alcohol abuse  Ileus following gastrointestinal surgery  BP (high blood pressure)  Sinus tachycardia  Delirium, drug-induced  Agitation  Tracheostomy status  Dysphagia    LOS: 60 days   Plan: continue current wound care We will see later in week Ok for SNF from surgery standpoint   Nihal Doan A 04/24/2012

## 2012-04-24 NOTE — Progress Notes (Signed)
Physical Therapy Treatment Patient Details Name: Michael Ellison MRN: [id number] DOB: 06/19/1946 Today's Date: 04/24/2012 Time: 4098-1191 PT Time Calculation (min): 22 min  PT Assessment / Plan / Recommendation Comments on Treatment Session  Treatment limited today as patient keen to return to room to eat his first meal since admission (that arrived as we were to start walking). Patient is making excellent progress.     Follow Up Recommendations  Skilled nursing facility    Barriers to Discharge  None      Equipment Recommendations  Defer to next venue    Recommendations for Other Services  None  Frequency Min 2X/week   Plan Discharge plan remains appropriate;Frequency remains appropriate    Precautions / Restrictions Precautions Precautions: None   Pertinent Vitals/Pain 97% with gait on 35% trach collar    Mobility  Bed Mobility Details for Bed Mobility Assistance: N/T up in chair Transfers Sit to Stand: 6: Modified independent (Device/Increase time);With upper extremity assist;From chair/3-in-1 Stand to Sit: 6: Modified independent (Device/Increase time);To chair/3-in-1;With upper extremity assist;With armrests Ambulation/Gait Ambulation/Gait Assistance: 6: Modified independent (Device/Increase time) Ambulation Distance (Feet): 450 Feet Assistive device: Rolling walker Ambulation/Gait Assistance Details: We continue to reduce his oxygen with ambulation and patient is maintaning his oxygen saturation at 97% with gait on 35% trach collar.  Gait Pattern: Within Functional Limits     PT Goals Acute Rehab PT Goals PT Goal: Sit to Stand - Progress: Met PT Goal: Stand to Sit - Progress: Met PT Goal: Ambulate - Progress: Met  Visit Information  Last PT Received On: 04/24/12 Assistance Needed: +1    Subjective Data  Subjective: Patient reports that he has been looking forward to walking Patient Stated Goal: Stay at SNF for a very short time   Cognition  Overall  Cognitive Status: Appears within functional limits for tasks assessed/performed Difficult to assess due to: Tracheostomy Arousal/Alertness: Awake/alert Orientation Level: Appears intact for tasks assessed Behavior During Session: [facility] for tasks performed    Balance  Dynamic Standing Balance Dynamic Standing - Balance Support: No upper extremity supported;During functional activity Dynamic Standing - Level of Assistance: 5: Stand by assistance  End of Session PT - End of Session Equipment Utilized During Treatment: Gait belt Activity Tolerance: Patient tolerated treatment well Patient left: in chair;with call bell/phone within reach Nurse Communication: Mobility status   Edwyna Perfect, PT  Pager [phone]  04/24/2012, 3:07 PM

## 2012-04-24 NOTE — Progress Notes (Signed)
Occupational Therapy Treatment Patient Details Name: MANCEL LARDIZABAL MRN: [id number] DOB: 10/04/1946 Today's Date: 04/24/2012 Time: 4098-1191 OT Time Calculation (min): 26 min  OT Assessment / Plan / Recommendation Comments on Treatment Session Excellent participation. Making good progress with strengthand endurance    Follow Up Recommendations  Skilled nursing facility    Barriers to Discharge       Equipment Recommendations  Defer to next venue    Recommendations for Other Services    Frequency Min 2X/week   Plan Discharge plan remains appropriate    Precautions / Restrictions Precautions Precautions: None   Pertinent Vitals/Pain none    ADL  ADL Comments: Focus on functional mobility for toileting and ADL @ Superivison level    OT Diagnosis:    OT Problem List:   OT Treatment Interventions:     OT Goals Acute Rehab OT Goals OT Goal Formulation: With patient Time For Goal Achievement: 04/24/12 Potential to Achieve Goals: Good ADL Goals Pt Will Perform Grooming: with supervision;Standing at sink;Unsupported ADL Goal: Grooming - Progress: Met Pt Will Perform Upper Body Bathing: with set-up;Sitting, edge of bed;Sitting, chair;Unsupported ADL Goal: Upper Body Bathing - Progress: Met Pt Will Perform Lower Body Bathing: Unsupported;Sit to stand from chair;Sit to stand from bed;with set-up;with supervision ADL Goal: Lower Body Bathing - Progress: Progressing toward goals Pt Will Perform Upper Body Dressing: with set-up;Sitting, chair;Sitting, bed;Unsupported ADL Goal: Upper Body Dressing - Progress: Progressing toward goals Pt Will Perform Lower Body Dressing: with set-up;with supervision;Unsupported;Sit to stand from chair;Sit to stand from bed ADL Goal: Lower Body Dressing - Progress: Progressing toward goals Pt Will Transfer to Toilet: with supervision;Ambulation;Comfort height toilet;Regular height toilet;Grab bars ADL Goal: Toilet Transfer - Progress: Met Pt Will  Perform Toileting - Clothing Manipulation: with supervision;Standing ADL Goal: Toileting - Clothing Manipulation - Progress: Met Pt Will Perform Toileting - Hygiene: with min assist;Sit to stand from 3-in-1/toilet ADL Goal: Toileting - Hygiene - Progress: Met Arm Goals Additional Arm Goal #1: Pt will be supervision with Bil UE stregthening using Level 1 or 2 theraband Arm Goal: Additional Goal #1 - Progress: Met Miscellaneous OT Goals Miscellaneous OT Goal #1: Pt will verbalize and use energy conservation techniques during treatment sessions with supervision. OT Goal: Miscellaneous Goal #1 - Progress: Progressing toward goals  Visit Information  Last OT Received On: 04/24/12 Assistance Needed: +1    Subjective Data      Prior Functioning       Cognition  Overall Cognitive Status: Appears within functional limits for tasks assessed/performed Difficult to assess due to: Tracheostomy Arousal/Alertness: Awake/alert Orientation Level: Appears intact for tasks assessed Behavior During Session: [facility] for tasks performed    Mobility Bed Mobility Details for Bed Mobility Assistance: N/T up in chair Transfers Transfers: Sit to Stand;Stand to Sit Sit to Stand: 6: Modified independent (Device/Increase time) Stand to Sit: 6: Modified independent (Device/Increase time);To chair/3-in-1;With upper extremity assist;With armrests   Exercises General Exercises - Upper Extremity Shoulder Flexion: 10 reps;Strengthening;Both;Theraband Shoulder ABduction: AROM;Both;15 reps;Seated;Theraband Shoulder Horizontal ABduction: Strengthening;Both;15 reps;Seated Shoulder Horizontal ADduction: Strengthening;Both;15 reps;Seated Elbow Flexion: Strengthening;Both;15 reps;Seated Elbow Extension: Strengthening;Both;15 reps;Seated Other Exercises Other Exercises: Marching in place x20, 3 sets  Balance Dynamic Standing Balance Dynamic Standing - Balance Support: No upper extremity supported;During functional  activity Dynamic Standing - Level of Assistance: 5: Stand by assistance  End of Session OT - End of Session Equipment Utilized During Treatment: Gait belt Activity Tolerance: Patient tolerated treatment well Patient left: in chair;with call bell/phone within reach Nurse  Communication: Mobility status  GO     Jossalyn Forgione,HILLARY 04/24/2012, 5:44 PM [facility], OTR/L  [phone] 04/24/2012

## 2012-04-24 NOTE — Progress Notes (Signed)
Speech Language Pathology Treatment Patient Details Name: Michael Ellison MRN: [id number] DOB: January 07, 1946 Today's Date: 04/24/2012 Time: 1000-1015 SLP Time Calculation (min): 15 min  Assessment / Plan / Recommendation Clinical Impression  Treatment focused on f/u diet tolerance assessment and PMSV use. Patient alert and appropriate. Able to place PMSV with supervision for use. Loud leak speech noted without PMSV however overall quality of phonation including loudness and clarity improved with use of PMSV. Educated patient regarding benefits of using PMSV during all po intake to decrease risk of aspiration. Patent able to consume clinician provided po trials including pureed solids and thin liquids without overt s/s of aspiration and with min verbal reminders for alternation of bite and sip and intermittent throat clear. Overall, patient appears to be tolerating diet and was able to utilize PMSV for 15 minutes without incidence. Per RN, patient may d/c to SNF today. Question possibility of both diet advancement prior to d/c as well as trach change to cuffless to increase safety with PMSV use at SNF.   Defer decisions to MD. SLP will continue to f/u. REcommend f/u at SNF if patient discharges today.     SLP Plan  Continue with current plan of care    Pertinent Vitals/Pain n/a  SLP Goals  SLP Goals Potential to Achieve Goals: Good Progress/Goals/Alternative treatment plan discussed with pt/caregiver and they: Agree SLP Goal #1: Pt will tolerate PMSV during all waking hours with intermittent supervision without distress.  SLP Goal #1 - Progress: Progressing toward goal SLP Goal #2: Pt will demonstrate independence with valve placement and removal with min verbal and visual cues.  SLP Goal #2 - Progress: Progressing toward goal  Swallowing Goals  SLP Swallowing Goals Patient will consume recommended diet without observed clinical signs of aspiration with: Independent assistance Swallow Study  Goal #1 - Progress: Progressing toward goal Patient will utilize recommended strategies during swallow to increase swallowing safety with: Independent assistance Swallow Study Goal #2 - Progress: Progressing toward goal  General Temperature Spikes Noted: No Respiratory Status: Trach (28%) Behavior/Cognition: Alert;Cooperative Oral Cavity - Dentition: Adequate natural dentition Patient Positioning: Upright in bed    [facility] MA, CCC-SLP [phone]        Michael Ellison Michael Ellison 04/24/2012, 10:18 AM

## 2012-04-25 ENCOUNTER — Telehealth (INDEPENDENT_AMBULATORY_CARE_PROVIDER_SITE_OTHER): Payer: Self-pay | Admitting: General Surgery

## 2012-04-25 MED ORDER — FREE WATER: 200.0000 mL | Freq: Three times a day (TID) | Status: DC

## 2012-04-25 MED ORDER — FUROSEMIDE 8 MG/ML PO SOLN: 40.0000 mg | Freq: Every day | ORAL | Status: DC

## 2012-04-25 MED ORDER — BUDESONIDE 0.5 MG/2ML IN SUSP: 0.5000 mg | Freq: Two times a day (BID) | RESPIRATORY_TRACT | Status: DC

## 2012-04-25 MED ORDER — ENSURE PUDDING PO PUDG: 1.0000 | Freq: Three times a day (TID) | ORAL | Status: DC

## 2012-04-25 MED ORDER — ESCITALOPRAM OXALATE 10 MG PO TABS: 10.0000 mg | ORAL_TABLET | Freq: Every day | ORAL | Status: DC

## 2012-04-25 MED ORDER — ALBUTEROL SULFATE (5 MG/ML) 0.5% IN NEBU: 2.5000 mg | INHALATION_SOLUTION | Freq: Four times a day (QID) | RESPIRATORY_TRACT | Status: DC

## 2012-04-25 MED ORDER — PANTOPRAZOLE SODIUM 40 MG PO PACK: 40.0000 mg | PACK | Freq: Every day | ORAL | Status: DC

## 2012-04-25 NOTE — Progress Notes (Signed)
Name: Michael Ellison MRN: [id number] DOB: Feb 18, 1946    LOS: 61 Date of admit 02/24/2012  8:34 AM  PULMONARY / CRITICAL CARE MEDICINE  Pt Profile:   66 y/o male smoker admitted to [facility] 02/24/2012 with n/v and abdominal pain due to SBO due to adhesions.  Had ex lap with SB resection 5/10.  Developed delirium post-op.  Transferred to [facility] 5/14 for respiratory failure requiring intubation.  Developed abdominal fascial dehiscence and taken back to OR 5/16.  Failed extubation, and required tracheostomy 5/22. Weaned to ATC, s/p Endoscopic Zenker diverticulectomy.  PMHx ETOH, HTN, Prostate cancer  Interval hx:  5/16: OR for fascial dehiscence repair- closed with wound vac 5/18- failed weaning 5/19- extubated 5/21- re-intubated for hypercarbia 5/22-improved co2 and neurostatus, pos almost 2 liters 5/22 SVT>>resolved spontaneously 5/30 -off vent for 24 hours, planned Panda placement 6/1 - up in chair, improved. 6/3 - desat, increased WOB, vent 6/3 - leak trach, improved with extra long 6/5 - FEES eval: failed, still showing evidence of aspiration.  6/16 - Few liquid stools with streaks of blood,  H/h stable.  (has been having mucoid / bloody streaked stool -surgeons aware) 6/18 Perc gastrostomy per IR 6/21 started 24 h T collar 6/25 - still on TC. Did PMV 7/1 Endoscopic Zenker diverticulectomy. 7/5 - Trach change 7/8 - tolerating liquid diet, remains on full dose TF -->reduce TF rate, allow regular food   Subjective: No new issues overnight.  On tc with 28% and no increased wob.  Denies cough or congestion  Vital Signs: Temp:  [98.2 F (36.8 C)-98.9 F (37.2 C)] 98.2 F (36.8 C) (07/09 0620) Pulse Rate:  [69-94] 88  (07/09 0841) Resp:  [16-18] 17  (07/09 0841) BP: (116-132)/(75-87) 132/87 mmHg (07/09 0620) SpO2:  [95 %-98 %] 96 % (07/09 0841) FiO2 (%):  [28 %-35 %] 28 % (07/09 0841) Weight:  [70.4 kg (155 lb 3.3 oz)] 70.4 kg (155 lb 3.3 oz) (07/09 5621)  Physical Examination: Gen:  well appearing, no acute distress HEENT: NCAT trach in place PULM: CTA B CV: RRR, no mgr, no JVD AB: nd/nt Ext: warm, no edema Derm: no rash or skin breakdown Neuro: A&Ox4, maew   Intake/Output Summary (Last 24 hours) at 04/25/12 0927 Last data filed at 04/25/12 0400  Gross per 24 hour  Intake    260 ml  Output   1755 ml  Net  -1495 ml   CBC  Lab 04/21/12 0515  HGB 10.9*  HCT 34.5*  WBC 8.4  PLT 244   BMET  Lab 04/21/12 0515 04/20/12 0540  NA 139 139  K 3.6 3.6  CL 102 104  CO2 27 28  GLUCOSE 100* 111*  BUN 10 13  CREATININE 0.58 0.61  CALCIUM 8.5 8.4  MG -- --  PHOS -- --   Lab Results  Component Value Date   ALT 10 03/29/2012   AST 13 03/29/2012   ALKPHOS 49 03/29/2012   BILITOT 0.3 03/29/2012    Intake/Output Summary (Last 24 hours) at 04/25/12 3086 Last data filed at 04/25/12 0400  Gross per 24 hour  Intake    260 ml  Output   1755 ml  Net  -1495 ml   pcxr 6/30 Bibasilar atx   - improved   ASSESSMENT AND PLAN  Acute hypoxic/hypercapnic respiratory failure 2nd to delirium, aspiration pneumonia, 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  s/p trach 5/22. Xtra long 6 cuffed 6/3>>> changed 7/5 Persistent hypoxia due to  COPD & secretions  Plan: -Cont scheduled BDs, continue ICS -TC as tolerated 24 h per daysince  6/21,now that drop in FIO2 to 28% , CAN  tr to SNF    SBO s/p lap with SB resection 5/10 complicated by post-op ileus and fascial dehiscence, and return to OR 5/16. Chronic pain. Dysphagia/ Protein calorie malnutrition MBS 6/26 >>primary severe cervical esophageal dysphagia with a large Zenker's Diverticulum confirmed by radiologist - Endoscopic Zenker diverticulectomy.Jenne Pane 7/1 - Passed swallow >> on PO  Plan: -Post-op care per CCS, appreciate wound care - stop TF rate, allow PO diet, if not tolerated transition to bolus feeds - calorie count to ensure adequate PO intake, ensure  Mild diarrhea with blood  streaks: Small quantity, cdiff negative, Hb stable. -  rectal pouch  6/21--off, no diarrhea - ct lovenox until ambulatory   Hypertension-on lisinopril  / norvasc at home.   -Changed to lopressor PO 6/17 (12.5 bid)  Plan:  cont norvasc (severe COPD)    DISPO - anticipate continued improvement with PT - Medically ready now for dc to SNF   Yolonda Kida PCCM Pager: [phone] Cell: [phone] If no response, call [phone]     04/25/2012 9:27 AM

## 2012-04-25 NOTE — Progress Notes (Signed)
.  Clinical social worker assisted with patient discharge to skilled nursing facility, [facility]. .Patient transportation provided by [employer] with patient chart copy.  Pt transportation scheduled for 3pm. .No further Clinical Social Work needs, signing off.   Catha Gosselin, LCSWA  [phone] .04/25/2012 1:21pm

## 2012-04-25 NOTE — Progress Notes (Signed)
Patient ID: Michael Ellison, male   DOB: 09/12/1946, 66 y.o.   MRN: [id number] 8 Days Post-Op  Subjective: Looks good,Voice is strong and clear. Remains on TC. Discharge to SNF today per medicine team.  Objective: Vital signs in last 24 hours: Temp:  [98.2 F (36.8 C)-98.3 F (36.8 C)] 98.2 F (36.8 C) (07/09 0620) Pulse Rate:  [79-97] 97  (07/09 1205) Resp:  [17-18] 18  (07/09 1205) BP: (108-132)/(74-87) 108/74 mmHg (07/09 0957) SpO2:  [95 %-98 %] 97 % (07/09 1205) FiO2 (%):  [28 %] 28 % (07/09 1205) Weight:  [155 lb 3.3 oz (70.4 kg)] 155 lb 3.3 oz (70.4 kg) (07/09 0620) Last BM Date: 04/24/12  Intake/Output from previous day: 07/08 0701 - 07/09 0700 In: 360 [P.O.:100; NG/GT:260] Out: 1755 [Urine:1150; Drains:605] Intake/Output this shift: Total I/O In: -  Out: 100 [Urine:100]  General appearance: alert, cooperative, appears stated age and no distress. Afebrile Adomen: soft, non tender, wound appears to be granulating in well with Aquacel. + BS, no bloating. Chest: CTA Extremities: no edema, no s/s DVT  Lab Results:  No results found for this basename: WBC:2,HGB:2,HCT:2,PLT:2 in the last 72 hours BMET No results found for this basename: NA:2,K:2,CL:2,CO2:2,GLUCOSE:2,BUN:2,CREATININE:2,CALCIUM:2 in the last 72 hours PT/INR No results found for this basename: LABPROT:2,INR:2 in the last 72 hours ABG No results found for this basename: PHART:2,PCO2:2,PO2:2,HCO3:2 in the last 72 hours  Studies/Results: No results found.  Anti-infectives: Anti-infectives     Start     Dose/Rate Route Frequency Ordered Stop   03/28/12 1500   levofloxacin (LEVAQUIN) IVPB 750 mg        750 mg 100 mL/hr over 90 Minutes Intravenous Every 24 hours 03/28/12 1348 04/11/12 1642   03/27/12 1600   vancomycin (VANCOCIN) 1,250 mg in sodium chloride 0.9 % 250 mL IVPB  Status:  Discontinued        1,250 mg 166.7 mL/hr over 90 Minutes Intravenous Every 12 hours 03/27/12 0541 03/28/12 1348   03/25/12 0600   vancomycin (VANCOCIN) 750 mg in sodium chloride 0.9 % 150 mL IVPB  Status:  Discontinued        750 mg 150 mL/hr over 60 Minutes Intravenous Every 12 hours 03/24/12 1423 03/27/12 0541   03/24/12 1600   vancomycin (VANCOCIN) 1,500 mg in sodium chloride 0.9 % 500 mL IVPB        1,500 mg 250 mL/hr over 120 Minutes Intravenous  Once 03/24/12 1423 03/24/12 1856   03/24/12 1530   cefTAZidime (FORTAZ) 1 g in dextrose 5 % 50 mL IVPB  Status:  Discontinued        1 g 100 mL/hr over 30 Minutes Intravenous Every 8 hours 03/24/12 1423 04/07/12 1005   03/24/12 1400   vancomycin (VANCOCIN) IVPB 1000 mg/200 mL premix  Status:  Discontinued        1,000 mg 200 mL/hr over 60 Minutes Intravenous Every 12 hours 03/24/12 1353 03/24/12 1400   03/03/12 1800   vancomycin (VANCOCIN) 1,750 mg in sodium chloride 0.9 % 500 mL IVPB  Status:  Discontinued        1,750 mg 250 mL/hr over 120 Minutes Intravenous Every 12 hours 03/03/12 0628 03/03/12 0918   02/29/12 1900   micafungin (MYCAMINE) 100 mg in sodium chloride 0.9 % 100 mL IVPB  Status:  Discontinued        100 mg 100 mL/hr over 1 Hours Intravenous Every 24 hours 02/29/12 1754 03/01/12 0933   02/29/12 1730   vancomycin (VANCOCIN)  1,250 mg in sodium chloride 0.9 % 250 mL IVPB  Status:  Discontinued        1,250 mg 166.7 mL/hr over 90 Minutes Intravenous Every 12 hours 02/29/12 1644 03/03/12 0628   02/29/12 1715   vancomycin (VANCOCIN) IVPB 1000 mg/200 mL premix  Status:  Discontinued        1,000 mg 200 mL/hr over 60 Minutes Intravenous  Once 02/29/12 1637 02/29/12 1647   02/29/12 1645   piperacillin-tazobactam (ZOSYN) IVPB 3.375 g  Status:  Discontinued        3.375 g 100 mL/hr over 30 Minutes Intravenous  Once 02/29/12 1637 02/29/12 1641   02/28/12 1800   piperacillin-tazobactam (ZOSYN) IVPB 3.375 g  Status:  Discontinued        3.375 g 12.5 mL/hr over 240 Minutes Intravenous 4 times per day 02/28/12 1454 02/28/12 1510   02/28/12  1800   piperacillin-tazobactam (ZOSYN) IVPB 3.375 g  Status:  Discontinued        3.375 g 12.5 mL/hr over 240 Minutes Intravenous Every 8 hours 02/28/12 1510 03/14/12 1700   02/25/12 1109   dextrose 5 % with cefOXitin (MEFOXIN) ADS Med     Comments: Michael Ellison: cabinet override         02/25/12 1109 02/25/12 2314   02/25/12 1100   cefOXitin (MEFOXIN) 1 g in dextrose 5 % 50 mL IVPB  Status:  Discontinued        1 g 100 mL/hr over 30 Minutes Intravenous 60 min pre-op 02/25/12 1100 02/25/12 1542          Assessment/Plan: s/p Procedure(s) (LRB): ZENKER'S DIVERTICULECTOMY ENDOSCOPIC (N/A) Continue with current wound care regimen. Patient is to be discharged to SNF today [facility]) He will need to follow up with our office in 1-2 weeks time, with Dr. Lindie Spruce.  LOS: 61 days    Michael Ellison 04/25/2012

## 2012-04-25 NOTE — Telephone Encounter (Signed)
Patient needs appt in 2 weeks with Dr Lindie Spruce for follow up. He is being discharged today to [facility] in [place]. Please call with appt.

## 2012-04-25 NOTE — Discharge Summary (Signed)
MCQUAID, DOUGLAS [place] PCCM Pager: [phone] Cell: [phone] If no response, call [phone]  

## 2012-04-25 NOTE — Progress Notes (Signed)
Trach changed by the NP to an uncuffed #6 XLT shiley. Vitals remained WNL. Placement verified by equal bbs.  No complications noted. RT will monitor.

## 2012-04-25 NOTE — Progress Notes (Signed)
Physical Therapy Treatment Patient Details Name: [name] MRN: [id number] DOB: 09/24/1946 Today's Date: 04/25/2012 Time: 1191-4782 PT Time Calculation (min): 23 min  PT Assessment / Plan / Recommendation Comments on Treatment Session  Excellent progress. Patient's motivation is outstanding and contributing to his increased function. Will progress to ambulation without a device.     Follow Up Recommendations  Skilled nursing facility    Barriers to Discharge  None      Equipment Recommendations  Defer to next venue    Recommendations for Other Services  None  Frequency Min 2X/week   Plan Discharge plan remains appropriate;Frequency remains appropriate    Precautions / Restrictions Precautions Precautions: None   Pertinent Vitals/Pain VSS/ no pain    Mobility  Bed Mobility Details for Bed Mobility Assistance: N/T up in chair Transfers Sit to Stand: 6: Modified independent (Device/Increase time);With upper extremity assist;From chair/3-in-1 Stand to Sit: 6: Modified independent (Device/Increase time);With armrests;To chair/3-in-1;With upper extremity assist Ambulation/Gait Ambulation/Gait Assistance: 6: Modified independent (Device/Increase time) Ambulation Distance (Feet): 900 Feet Assistive device: Rolling walker Ambulation/Gait Assistance Details: SpO2 maintained in mid 90's with increased distance with gait today.  Gait Pattern: Within Functional Limits    Exercises General Exercises - Lower Extremity Hip Flexion/Marching: AROM;Both;20 reps;Standing Toe Raises: AROM;Both;20 reps;Standing Mini-Sqauts: AROM;Both;20 reps;Standing Other Exercises Other Exercises: Knee felxion and hip extension standing at walker 20 reps each bil.     PT Goals Acute Rehab PT Goals PT Goal: Perform Home Exercise Program - Progress: Progressing toward goal  Visit Information  Last PT Received On: 04/25/12 Assistance Needed: +1    Subjective Data  Subjective: Patient reports  I'll do anything I can to get home Patient Stated Goal: Home as soon as he is ready.    Cognition  Overall Cognitive Status: Appears within functional limits for tasks assessed/performed Difficult to assess due to: Tracheostomy Arousal/Alertness: Awake/alert Orientation Level: Appears intact for tasks assessed Behavior During Session: [facility] for tasks performed    Balance   No evidence of imbalance with side stepping, gait etc.   End of Session PT - End of Session Equipment Utilized During Treatment: Gait belt Activity Tolerance: Patient tolerated treatment well Patient left: in chair;with call bell/phone within reach Nurse Communication: Mobility status    Edwyna Perfect, PT  Pager [phone]  04/25/2012, 10:43 AM

## 2012-04-25 NOTE — Discharge Summary (Signed)
MCQUAID, DOUGLAS [place] PCCM Pager: [phone] Cell: [phone] If no response, call [phone]  

## 2012-04-25 NOTE — Progress Notes (Signed)
Calorie Count Note  RD spoke with NP-C regarding nutrition care plan & calorie count results. Plan is for discharge to SNF today.  Diet: Heart Healthy Supplements: Ensure Pudding 3 times daily  Breakfast: 450 kcals, 24 gm protein (7/9) Lunch: 685 kcals, 30 gm protein (7/8) Dinner: 545 kcals, 28 gm protein (7/8) Supplements: 340 kcals, 8 gm protein (7/8)  Total intake: 2020 kcal (100% of minimum estimated needs)  90 protein (100% of minimum estimated needs)  Nutrition Dx: Inadequate Oral Intake, resolved  Goal: Oral intake with meals & supplements to meet >/= 90% of estimated nutrition needs, met  Intervention:   D/C calorie count  No further nutrition intervention  Ranae Palms Waylan Boga, RD, LDN Pager #: [phone]

## 2012-04-25 NOTE — Discharge Summary (Addendum)
Physician Discharge Summary  Patient ID: Michael Ellison MRN: [id number] DOB/AGE: [birth year] 66 y.o.  Admit date: 02/24/2012 Discharge date: 04/25/2012    Discharge Diagnoses:   Acute Respiratory failure s/p Tracheostomy. Aspiration pneumonia Alcohol abuse Ileus following gastrointestinal surgery Hypertension Sinus tachycardia Delirium / Agitation Dysphagia Anemia of critical illness   Brief Summary: Michael Ellison is a 66 y.o. y/o male, smoker, with a PMH of ETOH, HTN, Prostate Cancer admitted to [facility] 02/24/2012 with nausea / vomiting and abdominal pain secondary to small bowel obstruction due to adhesions.  Underwent exploratory laparotomy with small bowel resection 5/10. Developed delirium post-op. Transferred to [facility] 5/14 for respiratory failure requiring intubation. Developed abdominal fascial dehiscence and taken back to OR 5/16. Failed extubation, and required tracheostomy 5/22. Weaned to [facility] 6/21.  He was noted to have dysphagia and was evaluated by Speech Therapy and found to have a large Zenker diverticulum.  He is status post Endoscopic Zenker diverticulectomy 7/2.  Swallowing / diet was advanced post procedure.                                                                        Hospital Summary by Discharge Diagnosis   Acute Respiratory failure s/p Tracheostomy.  Michael Ellison is a 66 year old male who was initially admitted to [facility] with nausea and vomiting in the setting of small bowel obstruction.  He underwent exploratory laparotomy with small bowel resection on 5/10 and had subsequent postoperative delirium and respiratory failure requiring intubation. He was transferred to [facility] on 5/14 for further her ICU care. He had a prolonged course of respiratory failure secondary to abdominal complications, delirium, aspiration pneumonia (see antibiotics and cultures below), presumed to COPD.  He underwent placement of tracheostomy on Mar 08, 2012 and was  transitioned to a #6 XLT tracheostomy. Currently he has a #6 XLT uncuffed tracheostomy in place. He is tolerated to use Passy-Muir valve and is on 28% trach collar. He has been on trach collar since 6/21.  He will be contacted by the [facility] for appointment / evaluation for one month follow up regarding potential decannulation.    SBO s/p exploratory laparotomy with small bowel resection 5/10 complicated by post-op ileus and fascial dehiscence, and return to OR 5/16.  Dysphagia Initially presented on 02/24/2012 with nausea/vomiting and abdominal pain and was found to have small bowel obstruction in the setting of adhesions. He underwent exploratory laparotomy with small bowel resection on 5/10.  His postop course was complicated by significant delirium and acute respiratory failure requiring intubation.  Unfortunately he developed abdominal fascial dehiscence and was taken back to the operating room on 5/16 for debridement and closure of abdominal wound. His course was complicated by bloody diarrhea which was C. difficile negative and ileus. He underwent placement of percutaneous gastrostomy tube on 6/18 by interventional radiology.  Initially he was supported with TNA and transitioned to tube feeding via PEG.  He was evaluated by speech therapy and found to have primary severe cervical esophageal dysphasia with a large Zenker's diverticulum requiring a Zenker's diverticulectomy on 7/2.  Post procedure he passed a swallowing evaluation to beating rate was decreased and by mouth diet was advanced.  Currently is tolerating a  heart healthy diet with no mechanical restrictions.  Followup can be made with interventional radiology [facility] in 1-2 months for consideration of removal of PEG tube. Currently he can continue on free water and feeding supplements. As his dietary intake continues to improve is recommended to evaluate the need for free water and supplements.   Severe Deconditioning    Secondary to #1 and #2 with prolonged hospitalization.  He is made significant improvements with physical therapy and is able to ambulate approximately 800 feet with a walker and physical therapy assist.  Recommended he continue to have intensive physical therapy upon transfer.   Hypertension / Hypernatremia Prior to admission Michael Ellison was on lisinopril and Norvasc.  He was placed on Norvasc during hospitalization and a trial of Lopressor. Lopressor was stopped secondary to severe COPD. He currently is on a raft and Lasix with adequate blood pressure control.  Recommended he have a followup BMP in one week to review serum potassium, Na+  and renal function.   Acute metabolic encephalopathy / Depression / Anxiety Acute encephalopathy secondary to sepsis, respiratory failure, post-op pain, and ETOH abuse.   He was noted to have a history of depression however had stopped taking his medications he was self-medicating with alcohol prior to admit.  Lexapro was initiated on  5/31 he continues on 10 mg daily.      Consults: [facility] Speech Therapy  Physical Therapy   Current Lines/tubes: #6 XLT cuffless trach PEG tube  Microbiology/Sepsis markers: BCX2 5/14 >>1/2 gram neg rod>>> 1/2 Klebsiella  UC 5/14 >>Enterococcus (sensitive to ampicillin)  Sputum 5/14>> Klebsiella pneumoniae (pan sensitive)  Wound 5/14>>Klebsiella Pneumoniae (pan sensitive) & Enterobacter Cloacae (resistant to ancef, cefoxitin)  BC X2 5/20>>>ng  Sputum 5/22>>>klebs, enterobacter  Sputum 6/7>KLEBSIELLA PNEUMONIAE and STENOTROPHOMONAS MALTOPHILIA.   Antibiotics:  vanc 5/14 >> 5/17  Zosyn 5/13 >>5/28  Vanc 6/9>>>6/11 Ceftaz 6/9 (klebsiella Sputum and wound)> 6/21  levaquin (stenotrophomonas) 6/11>>> (6/25)    Interval hx:  5/16: OR for fascial dehiscence repair- closed with wound vac  5/18- failed weaning  5/19- extubated  5/21- re-intubated for hypercarbia  5/22-improved co2 and  neurostatus, pos almost 2 liters  5/22 SVT>>resolved spontaneously  5/30 -off vent for 24 hours, planned Panda placement  6/1 - up in chair, improved.  6/3 - desat, increased WOB, vent  6/3 - leak trach, improved with extra long  6/5 - FEES eval: failed, still showing evidence of aspiration.  6/16 - Few liquid stools with streaks of blood, H/h stable. (has been having mucoid / bloody streaked stool -surgeons aware)  6/18 - Perc gastrostomy per IR  6/21 - started 24 h T collar  6/25 - still on TC. Did PMV  7/1 - Endoscopic Zenker diverticulectomy.  7/5 - Trach change  7/8 - tolerating liquid diet, remains on full dose TF -->reduce TF rate, allow regular food   Discharge Exam: Gen: well appearing, no acute distress  HEENT: NCAT trach in place  PULM: CTA B  CV: RRR, no mgr, no JVD  AB: nd/nt  Ext: warm, no edema  Derm: no rash or skin breakdown  Neuro: A&Ox4, maew   Filed Vitals:   04/25/12 0620 04/25/12 0841 04/25/12 0957 04/25/12 1205  BP: 132/87  108/74   Pulse: 79 88    Temp: 98.2 F (36.8 C)     TempSrc: Oral     Resp: 18 17    Height:      Weight: 155 lb 3.3  oz (70.4 kg)     SpO2: 96% 96%  97%     Discharge Labs  BMET  Lab 04/21/12 0515 04/20/12 0540  NA 139 139  K 3.6 3.6  CL 102 104  CO2 27 28  GLUCOSE 100* 111*  BUN 10 13  CREATININE 0.58 0.61  CALCIUM 8.5 8.4  MG -- --  PHOS -- --   CBC  Lab 04/21/12 0515  HGB 10.9*  HCT 34.5*  WBC 8.4  PLT 244     Discharge Orders    Future Appointments: Provider: Department: Dept Phone: Center:   05/05/2012 2:15 PM Cherylynn Ridges, MD [facility] [phone] None     Future Orders Please Complete By Expires   Activity as tolerated - No restrictions      Discharge instructions      Comments:   Heart Healthy Diet.  No mechanical Restrictions.   Trach Care QD.  Patient has #6 XLT cuffless.  Ok to wear PMV during day.  FiO2 28% per Trach Collar.  Wean to off as he improves for sats > 92%.         Follow-up Information    Follow up with WYATT, Marta Lamas, MD. (Dr. Lavella Lemons RN will call [facility] with the appointment.)    Contact information:   [address] [facility] [place] [address] [phone]       Follow up with Nelda Bucks., MD. (Appt at [facility] for potential Decannulation.  Needs to be seen in 1 month.  They will call [facility])    Contact information:   [address] [employer] [place] [address] [phone]        Discharge Medications  Medication List  As of 04/25/2012 12:08 PM   START taking these medications         albuterol (5 MG/ML) 0.5% nebulizer solution   Commonly known as: PROVENTIL   Take 0.5 mLs (2.5 mg total) by nebulization every 6 (six) hours. AND Q3 PRN SOB or Wheezing      budesonide 0.5 MG/2ML nebulizer solution   Commonly known as: PULMICORT   Take 2 mLs (0.5 mg total) by nebulization 2 (two) times daily.      escitalopram 10 MG tablet   Commonly known as: LEXAPRO   Take 1 tablet (10 mg total) by mouth daily.      feeding supplement Pudg   Take 1 Container by mouth 3 (three) times daily between meals.      free water Soln   Place 200 mLs into feeding tube 3 (three) times daily.      furosemide 8 MG/ML solution   Commonly known as: LASIX   Take 5 mLs (40 mg total) by mouth daily.      pantoprazole sodium 40 mg/20 mL Pack   Commonly known as: PROTONIX   Take 20 mLs (40 mg total) by mouth at bedtime.         CONTINUE taking these medications         amLODipine 5 MG tablet   Commonly known as: NORVASC         STOP taking these medications         imipramine 25 MG tablet      lisinopril 20 MG tablet          Where to get your medications       Information on where to get these meds is  not yet available. Ask your nurse or doctor.         albuterol (5 MG/ML) 0.5% nebulizer solution   budesonide 0.5 MG/2ML nebulizer  solution   escitalopram 10 MG tablet   feeding supplement Pudg   free water Soln   furosemide 8 MG/ML solution   pantoprazole sodium 40 mg/20 mL Pack              Disposition: [facility] skilled nursing facility.  Discharged Condition: LASEAN GORNIAK has met maximum benefit of inpatient care and is medically stable and cleared for discharge to [facility] skilled nursing facility.  He is able to ambulate approximately 800 feet with the assistance of a walker and physical therapy. Again is recommended that he be considered for removal of PEG tube in approximately one to 2 months barring no further abdominal complications and tolerating diet. This can be scheduled with interventional radiology at [facility].     Dr. Lavella Lemons office will call for followup appointment. Further followup per recommendations of [facility] skilled nursing facility.   Time spent on disposition:  Greater than 35 minutes.   Signed: [name], NP-C [facility] Pgr: [phone]

## 2012-04-25 NOTE — Progress Notes (Signed)
Pt received discharge instructions with no further questions. We went over all new medications, follow up appointments, when to call 911, wound care, peg tube care, and trach care. He is stable for DC to [facility]. Called report to [facility]. Changed dressing prior to DC. Ordered extra hydrogel to send to SNF for wound care. [facility] to transport. Duwaine Maxin, RN

## 2012-05-05 ENCOUNTER — Encounter (INDEPENDENT_AMBULATORY_CARE_PROVIDER_SITE_OTHER): Payer: Self-pay | Admitting: General Surgery

## 2012-05-05 ENCOUNTER — Ambulatory Visit (INDEPENDENT_AMBULATORY_CARE_PROVIDER_SITE_OTHER): Payer: Medicare Other | Admitting: General Surgery

## 2012-05-05 VITALS — BP 132/80 | Temp 98.0°F | Resp 16 | Ht 75.0 in | Wt 164.6 lb

## 2012-05-05 DIAGNOSIS — Z09 Encounter for follow-up examination after completed treatment for conditions other than malignant neoplasm: Secondary | ICD-10-CM

## 2012-05-05 NOTE — Progress Notes (Signed)
HPI The patient is doing very well status post long hospitalization for abdominal sepsis after a small bowel resection for bowel obstruction. He had a subsequent fascial dehiscence requiring laparotomy and reclosure. Except limited to respiratory an overall failure with sepsis. He has a gastrostomy tube in place and a tracheostomy tube in place. The pulmonary critical care service is having his tracheostomy tube. The gastrostomy tube as a percutaneous one placed by interventional radiology.  PE On examination of his midline wound is completely healed. He has no abdominal pain. He was eating well without any evidence of obstruction. His gastrostomy tube is in place but has only been in place for one month. I will contact interventional radiology department to see if that would be all right first removed the gastrostomy tube in our clinic in a couple weeks. We usually wait at least 6 weeks before we remove percutaneous endoscopic gastrostomy tubes  Studiy review None.  Assessment During or after a long hospitalization for abdominal sepsis respiratory failure and abdominal fascial dehiscence.  Plan I have the patient return to our clinic for a gastrostomy tube removal and 2 weeks. I will check with the interventional radiology staff to see if simple traction removal is advisable. If not I will have him return to their department for removal.  His tracheostomy can be removed at this time. He is able to mobilize his secretions very well. He was phonating very well with a Passy-Muir valve in place.

## 2012-05-18 ENCOUNTER — Ambulatory Visit (INDEPENDENT_AMBULATORY_CARE_PROVIDER_SITE_OTHER): Payer: Medicare Other | Admitting: General Surgery

## 2012-05-18 ENCOUNTER — Encounter (INDEPENDENT_AMBULATORY_CARE_PROVIDER_SITE_OTHER): Payer: Self-pay | Admitting: General Surgery

## 2012-05-18 VITALS — BP 140/80 | HR 64 | Temp 99.0°F | Resp 16 | Ht 75.0 in | Wt 167.8 lb

## 2012-05-18 DIAGNOSIS — K9423 Gastrostomy malfunction: Secondary | ICD-10-CM | POA: Insufficient documentation

## 2012-05-18 NOTE — Progress Notes (Signed)
The patient comes in today with the gastrostomy tube that needs to be removed. I confirmed with the radiology department but this was a regular posterior thigh to the to be pulled out percutaneously. That was the case.  To require some local anesthetic and a small incision around the gastrostomy tube we were able to remove completely and intact. There was some considerable discomfort by the patient however the tube was removed intact and we covered it with metabolic ointment and 4 x 4 gauze. I discussed with the patient that there may be some leakage for these 24-48 hours but it should subside after that. I did not enlarged gastrostomy opening all.  The patient continues to do well long-term will see him one more time for followup.

## 2012-05-24 ENCOUNTER — Encounter (HOSPITAL_COMMUNITY): Payer: Self-pay

## 2012-05-24 ENCOUNTER — Ambulatory Visit (HOSPITAL_COMMUNITY)
Admit: 2012-05-24 | Discharge: 2012-05-24 | Disposition: A | Discharge status: Home / Self Care | Payer: Medicare Other | Attending: Internal Medicine | Admitting: Internal Medicine

## 2012-05-24 VITALS — BP 135/87 | HR 94 | Resp 20

## 2012-05-24 DIAGNOSIS — J95821 Acute postprocedural respiratory failure: Secondary | ICD-10-CM

## 2012-05-24 DIAGNOSIS — J4489 Other specified chronic obstructive pulmonary disease: Secondary | ICD-10-CM | POA: Insufficient documentation

## 2012-05-24 DIAGNOSIS — Z430 Encounter for attention to tracheostomy: Secondary | ICD-10-CM | POA: Insufficient documentation

## 2012-05-24 DIAGNOSIS — J449 Chronic obstructive pulmonary disease, unspecified: Secondary | ICD-10-CM | POA: Insufficient documentation

## 2012-05-24 DIAGNOSIS — Z79899 Other long term (current) drug therapy: Secondary | ICD-10-CM | POA: Insufficient documentation

## 2012-05-24 DIAGNOSIS — Z930 Tracheostomy status: Secondary | ICD-10-CM

## 2012-05-24 NOTE — Progress Notes (Signed)
Patient ID: Michael Ellison, male   DOB: 1945-10-28, 66 y.o.   MRN: [id number] 66 y.o. y/o male, smoker, with a PMH of ETOH, HTN, Prostate Cancer admitted to [facility] 02/24/2012 with nausea / vomiting and abdominal pain secondary to small bowel obstruction due to adhesions. Underwent exploratory laparotomy with small bowel resection 5/10. Developed delirium post-op. Transferred to [facility] 5/14 for respiratory failure requiring intubation. Developed abdominal fascial dehiscence and taken back to OR 5/16. Failed extubation, and required tracheostomy 5/22. Weaned to [facility] 6/21. He was noted to have dysphagia and was evaluated by Speech Therapy and found to have a large Zenker diverticulum. He is status post Endoscopic Zenker diverticulectomy 7/2. Swallowing / diet was advanced post procedure.  He presents today 8/7 to trach clinic for follow up and evaluation for possible decannulation.    Past Medical History  Diagnosis Date  . Hypertension   . Cancer   . Cancer of prostate    History   Social History  . Marital Status: Divorced    Spouse Name: N/A    Number of Children: N/A  . Years of Education: N/A   Occupational History  . Not on file.   Social History Main Topics  . Smoking status: Former Smoker    Types: Cigarettes    Quit date: 02/16/2012  . Smokeless tobacco: Never Used  . Alcohol Use: Yes     occasional  . Drug Use: No  . Sexually Active: No   Other Topics Concern  . Not on file   Social History Narrative  . No narrative on file   Family History  Problem Relation Age of Onset  . Pseudochol deficiency Neg Hx   . Anesthesia problems Neg Hx   . Hypotension Neg Hx   . Malignant hyperthermia Neg Hx    Allergies  Allergen Reactions  . Lorazepam Other (See Comments)    Severe agitated delirium. Tolerates midazolam and other benzo's   Prior to Admission medications   Medication Sig Start Date End Date Taking? Authorizing Provider  albuterol (PROVENTIL) (5 MG/ML) 0.5% nebulizer  solution Take 0.5 mLs (2.5 mg total) by nebulization every 6 (six) hours. AND Q3 PRN SOB or Wheezing 04/25/12 04/25/13 Yes Jeanella Craze, NP  amLODipine (NORVASC) 5 MG tablet Take 5 mg by mouth daily.   Yes Historical Provider, MD  budesonide (PULMICORT) 0.5 MG/2ML nebulizer solution Take 2 mLs (0.5 mg total) by nebulization 2 (two) times daily. 04/25/12 04/25/13 Yes Jeanella Craze, NP  escitalopram (LEXAPRO) 10 MG tablet Take 1 tablet (10 mg total) by mouth daily. 04/25/12 07/24/12 Yes Jeanella Craze, NP  furosemide (LASIX) 8 MG/ML solution Take 5 mLs (40 mg total) by mouth daily. 04/25/12 04/25/13 Yes Jeanella Craze, NP  HYDROcodone-acetaminophen (VICODIN) 5-500 MG per tablet Take 5-500 mg by mouth as needed. 05/15/12  Yes Historical Provider, MD  pantoprazole sodium (PROTONIX) 40 mg/20 mL PACK Take 20 mLs (40 mg total) by mouth at bedtime. 04/25/12  Yes Jeanella Craze, NP  Water For Irrigation, Sterile (FREE WATER) SOLN Place 200 mLs into feeding tube 3 (three) times daily. 04/25/12  Yes Jeanella Craze, NP  feeding supplement (ENSURE) PUDG Take 1 Container by mouth 3 (three) times daily between meals. 04/25/12   Jeanella Craze, NP  Subjective Feels excellent. He is out of rehab. Has resumed normal daily activities. Mowing lawn. Walked into clinic w/out difficulty.   Exam BP 141/93  Pulse 96  Resp 17  SpO2 95%  General: well developed AAM, breathing comfortably on PMV. Looks remarkably improved after his prolonged critical illness.  HENT: # 6 cuffless w/ PMV in place. We removed this, occluded the tracheostomy with finger occlusion at the bedside. He had no trouble breathing. Had excellent cough and phonation.  Pulm; clear to auscultation Card: RRR, no MRG Abd: non-tender Neuro: alert and oriented.   Procedure: Successful decannulation at bedside after passing bedside trach occlusion.   I/P 1) s/p Trach removal following prolonged critical illness.  2) COPD Plan: -have placed occlusive dressing on trach  site. Instructed about trach site care.  -he has f/u w/ Dr Delton Coombes on 9/5 for evaluation of COPD, and also follow up trach removal.  -instructed to cont his current BD regimen in the mean time -our office phone number was provided for questions.   [name] ACNP-BC [facility] Pulmonary/Critical Care Pager # [phone] OR # [phone] if no answer   Attending Addendum:  I have seen the patient, discussed the issues, test results and plans with Kreg Shropshire, NP. I agree with the Assessment and Plans as ammended above.   Levy Pupa, MD, PhD 05/24/2012, 12:33 PM [facility] [phone] or if no answer [phone]

## 2012-05-24 NOTE — Progress Notes (Signed)
Tracheostomy Procedure Note  [name] [id number] 06/03/1946   Pt came in w/ 6.0 Shiley XLP trach.  Pt is able to cough secretions up to mouth/ spits out white sputum.  Pt wearing PMV not c/o problems.  Pt on room air, sat 99%.  Pt denies SOB except DOE (pt mowed yard this week).    Procedure: [name] came in to eval pt, occluded trach for several minutes, pt denies SOB, no distress noted.  [name], NP decannulated pt.  No distress noted, sats 96% on room air.  Pt able to phonate well.      Tracheostomy Information :   Evaluation:   Vital signs:blood pressure 130/71, pulse 93, respirations 19 and pulse oximetry 96 % on room air  Patients current condition: stable Complications: No apparent complications Trach site exam: clean Wound care done: dry Patient did tolerate procedure well.   Education: Pt is aware to clean stoma w/ warm soapy water every day, place dry gauze dressing w/ tape over top.  Pt was sent dresssing supplies home.     Additional needs:  All questions answered for pt and daughter [name].  No further needs noted.

## 2012-06-13 ENCOUNTER — Encounter (INDEPENDENT_AMBULATORY_CARE_PROVIDER_SITE_OTHER): Payer: Medicare Other | Admitting: General Surgery

## 2012-06-22 ENCOUNTER — Institutional Professional Consult (permissible substitution): Payer: Medicare Other | Admitting: Emergency Medicine

## 2012-06-23 ENCOUNTER — Encounter: Payer: Self-pay | Admitting: Emergency Medicine

## 2012-06-23 ENCOUNTER — Ambulatory Visit (INDEPENDENT_AMBULATORY_CARE_PROVIDER_SITE_OTHER): Payer: Medicare Other | Admitting: Emergency Medicine

## 2012-06-23 VITALS — BP 128/80 | HR 82 | Temp 97.6°F | Ht 75.0 in | Wt 176.2 lb

## 2012-06-23 DIAGNOSIS — Z23 Encounter for immunization: Secondary | ICD-10-CM

## 2012-06-23 DIAGNOSIS — Z930 Tracheostomy status: Secondary | ICD-10-CM

## 2012-06-23 DIAGNOSIS — J449 Chronic obstructive pulmonary disease, unspecified: Secondary | ICD-10-CM | POA: Insufficient documentation

## 2012-06-23 NOTE — Progress Notes (Signed)
Subjective:    Patient ID: Michael Ellison, male    DOB: 1946-07-08, 66 y.o.   MRN: [id number]  HPI 66 y.o. y/o male, smoker, with a PMH of ETOH, HTN, Prostate Cancer admitted to [facility] 02/24/2012 with nausea / vomiting and abdominal pain secondary to small bowel obstruction due to adhesions. Underwent exploratory laparotomy with small bowel resection 5/10. Developed delirium post-op. Transferred to [facility] 5/14 for respiratory failure requiring intubation. Developed abdominal fascial dehiscence and taken back to OR 5/16. Failed extubation, and required tracheostomy 5/22. Weaned to [facility] 6/21. He was noted to have dysphagia and was evaluated by Speech Therapy and found to have a large Zenker diverticulum. He is status post Endoscopic Zenker diverticulectomy 7/2. Swallowing / diet was advanced post procedure. He was decanulated 8/7, PEG tube out 8/1. Presents now for eval of his COPD.   He was d/c to home on albuterol nebs q6h and pulmicort nebs q12h. Tells me that he is having some exertional SOB. He does not have any O2 at this time.     Review of Systems  Constitutional: Negative for fever, chills, diaphoresis, activity change, appetite change, fatigue and unexpected weight change.  HENT: Negative for ear pain, nosebleeds, congestion, sore throat, rhinorrhea, mouth sores, trouble swallowing, voice change, postnasal drip, sinus pressure and tinnitus.   Eyes: Positive for visual disturbance.  Respiratory: Positive for shortness of breath. Negative for cough, choking, chest tightness and wheezing.   Cardiovascular: Negative for chest pain, palpitations and leg swelling.  Gastrointestinal: Positive for abdominal pain. Negative for nausea, vomiting, constipation and blood in stool.  Genitourinary: Negative for difficulty urinating.  Musculoskeletal: Negative for back pain and gait problem.  Skin: Negative for color change, pallor and rash.  Neurological: Positive for dizziness. Negative for tremors, speech  difficulty, weakness, light-headedness, numbness and headaches.  Hematological: Does not bruise/bleed easily.  Psychiatric/Behavioral: Negative for confusion, disturbed wake/sleep cycle and agitation. The patient is not nervous/anxious.     Past Medical History  Diagnosis Date  . Hypertension   . Cancer   . Cancer of prostate      Family History  Problem Relation Age of Onset  . Pseudochol deficiency Neg Hx   . Anesthesia problems Neg Hx   . Hypotension Neg Hx   . Malignant hyperthermia Neg Hx      History   Social History  . Marital Status: Divorced    Spouse Name: N/A    Number of Children: N/A  . Years of Education: N/A   Occupational History  . retired     Electronics engineer   Social History Main Topics  . Smoking status: Former Smoker -- 1.5 packs/day for 44 years    Types: Cigarettes    Quit date: 02/22/2012  . Smokeless tobacco: Never Used  . Alcohol Use: Yes     occasional beer 1-2  . Drug Use: No  . Sexually Active: No   Other Topics Concern  . Not on file   Social History Narrative  . No narrative on file     Allergies  Allergen Reactions  . Lorazepam Other (See Comments)    Severe agitated delirium. Tolerates midazolam and other benzo's     Outpatient Prescriptions Prior to Visit  Medication Sig Dispense Refill  . albuterol (PROVENTIL) (5 MG/ML) 0.5% nebulizer solution Take 0.5 mLs (2.5 mg total) by nebulization every 6 (six) hours. AND Q3 PRN SOB or Wheezing  20 mL    . amLODipine (NORVASC) 5 MG tablet Take 5 mg  by mouth daily.      . budesonide (PULMICORT) 0.5 MG/2ML nebulizer solution Take 2 mLs (0.5 mg total) by nebulization 2 (two) times daily.      Marland Kitchen escitalopram (LEXAPRO) 10 MG tablet Take 1 tablet (10 mg total) by mouth daily.      Marland Kitchen HYDROcodone-acetaminophen (VICODIN) 5-500 MG per tablet Take 5-500 mg by mouth as needed.      . feeding supplement (ENSURE) PUDG Take 1 Container by mouth 3 (three) times daily between meals.      . furosemide (LASIX)  8 MG/ML solution Take 5 mLs (40 mg total) by mouth daily.      . pantoprazole sodium (PROTONIX) 40 mg/20 mL PACK Take 20 mLs (40 mg total) by mouth at bedtime.  30 each    . Water For Irrigation, Sterile (FREE WATER) SOLN Place 200 mLs into feeding tube 3 (three) times daily.            Objective:   Physical Exam Filed Vitals:   06/23/12 0926  BP: 128/80  Pulse: 82  Temp: 97.6 F (36.4 C)   Gen: Pleasant, thin, in no distress,  normal affect  ENT: No lesions,  mouth clear,  oropharynx clear, no postnasal drip  Neck: No JVD, no TMG, no carotid bruits, trach site closing. There is a small bit of herniating tissue out of the stoma, small amount of associated mucous.   Lungs: No use of accessory muscles, distant, clear without rales or rhonchi  Cardiovascular: RRR, heart sounds normal, no murmur or gallops, no peripheral edema  Musculoskeletal: No deformities, no cyanosis or clubbing  Neuro: alert, non focal  Skin: Warm, no lesions or rashes     Assessment & Plan:  COPD (chronic obstructive pulmonary disease) Appears to be approaching his pre-hospitalization baseline.  - continue albuterol + pulmicort nebs for now, will consider change to long-acting meds after  pft - walking oximetry today >> start O2 at 2L/min w exertion.  - pneumovax and flu shot today - rov w PFT in 6-8 weeks  Tracheostomy status Stoma almost closed, strong voice. There is a small bit of tissue herniating out of the stoma, ? Whether this is slowing closure. If not closed at next visit i will send him to ENT for eval.

## 2012-06-23 NOTE — Assessment & Plan Note (Addendum)
Appears to be approaching his pre-hospitalization baseline.  - continue albuterol + pulmicort nebs for now, will consider change to long-acting meds after  pft - walking oximetry today >> start O2 at 2L/min w exertion.  - pneumovax and flu shot today - rov w PFT in 6-8 weeks

## 2012-06-23 NOTE — Patient Instructions (Addendum)
Please continue your albuterol and pulmicort nebulizers for now. We may decide to change these in the future.  Walking oximetry today shows that you need to wear oxygen with exertion. We will start oxygen at 2L/min.  We will perform full breathing testing at your next office visit Follow with Dr Delton Coombes in 6 -8 weeks with full PFT's

## 2012-06-23 NOTE — Assessment & Plan Note (Signed)
Stoma almost closed, strong voice. There is a small bit of tissue herniating out of the stoma, ? Whether this is slowing closure. If not closed at next visit i will send him to ENT for eval.

## 2012-07-11 ENCOUNTER — Telehealth (INDEPENDENT_AMBULATORY_CARE_PROVIDER_SITE_OTHER): Payer: Self-pay | Admitting: General Surgery

## 2012-07-11 ENCOUNTER — Ambulatory Visit (INDEPENDENT_AMBULATORY_CARE_PROVIDER_SITE_OTHER): Payer: Medicare Other | Admitting: General Surgery

## 2012-07-11 ENCOUNTER — Encounter (INDEPENDENT_AMBULATORY_CARE_PROVIDER_SITE_OTHER): Payer: Self-pay | Admitting: General Surgery

## 2012-07-11 VITALS — BP 138/80 | HR 85 | Temp 98.8°F | Resp 18 | Ht 75.0 in | Wt 176.0 lb

## 2012-07-11 DIAGNOSIS — K439 Ventral hernia without obstruction or gangrene: Secondary | ICD-10-CM

## 2012-07-11 MED ORDER — HYDROCODONE-ACETAMINOPHEN 5-500 MG PO TABS
25.0000 | ORAL_TABLET | ORAL | Status: DC | PRN
Start: 2012-07-11 — End: 2012-12-25

## 2012-07-11 NOTE — Telephone Encounter (Signed)
Pharmacist called to clarify directions for Hydrocodone 5/500.  Written to dispense # 25 and to take 25 po as needed.  Gave orders to take 1-2 po Q6H prn.

## 2012-07-11 NOTE — Progress Notes (Signed)
The patient is doing well however he has developed a hernia in the upper portion of his abdominal wall. The edges can be palpated and it is approximately 5 cm in size. It is somewhat symptomatic. It is easily reducible and soft.  The patient is also complaining of some extremity pain in his legs and arms. This I've referred back to his primary care physician.  He is now taking home oxygen. He desaturates when he exercises very easily. I recommended that we not consider repairing his hernia until he is well out from surgery and improved significantly. Therefore we made an appointment for him. We'll give him some hydrocodone for his discomfort and pain and also prescribe an abdominal binder however I'll see him in 6-9 months and let him make the appointment to reassess him for condition to undergo surgery for his hernia.

## 2012-07-31 ENCOUNTER — Telehealth (INDEPENDENT_AMBULATORY_CARE_PROVIDER_SITE_OTHER): Payer: Self-pay | Admitting: General Surgery

## 2012-07-31 NOTE — Telephone Encounter (Signed)
Patient's daughter calls in today stating that her father seen dr.Wyatt on 07/10/12 for abd wall hernia which in the note stated it was easily reducible and was wanting some ibuprofen called in because her father was in pain because he did not want to take the pain med and get hooked on it..I asked the patient's daughter if the hernia was still reducible and she stated that she did not know that she was at work and would check when she got home today and give [place] a call back...also after talking with Marcelino Duster I advised the patient's daughter to have the patient alternate hydrocodone and ibuprofen which patient's daughter was fine with all instructions...just waiting on a call back from her.Marland Kitchen

## 2012-08-10 ENCOUNTER — Encounter: Payer: Self-pay | Admitting: Emergency Medicine

## 2012-08-10 ENCOUNTER — Ambulatory Visit (INDEPENDENT_AMBULATORY_CARE_PROVIDER_SITE_OTHER): Payer: Medicare Other | Admitting: Emergency Medicine

## 2012-08-10 VITALS — BP 130/80 | HR 75 | Temp 98.0°F | Ht 75.0 in | Wt 185.0 lb

## 2012-08-10 DIAGNOSIS — J449 Chronic obstructive pulmonary disease, unspecified: Secondary | ICD-10-CM

## 2012-08-10 DIAGNOSIS — Z930 Tracheostomy status: Secondary | ICD-10-CM

## 2012-08-10 DIAGNOSIS — J4489 Other specified chronic obstructive pulmonary disease: Secondary | ICD-10-CM

## 2012-08-10 LAB — PULMONARY FUNCTION TEST

## 2012-08-10 MED ORDER — TIOTROPIUM BROMIDE MONOHYDRATE 18 MCG IN CAPS: 18.0000 ug | ORAL_CAPSULE | Freq: Every day | RESPIRATORY_TRACT | Status: DC

## 2012-08-10 NOTE — Progress Notes (Signed)
PFT done today. 

## 2012-08-10 NOTE — Assessment & Plan Note (Signed)
Trach site and stoma closed, looks good

## 2012-08-10 NOTE — Patient Instructions (Signed)
Please continue your Pulmicort and albuterol for now - we may decide to change these in the future Start Spiriva 1 inhalation daily Follow with Dr Delton Coombes in 2 months or sooner if you have any problems.

## 2012-08-10 NOTE — Assessment & Plan Note (Signed)
PFT confirm moderately severe AFL.  - add spiriva today to pulmicort + albuterol prn - consider changing further to add LABA/ICS next time - O2 with exertion.  - rov 2 months

## 2012-08-10 NOTE — Progress Notes (Signed)
  Subjective:    Patient ID: Michael Ellison, male    DOB: [birth year], 66 y.o.   MRN: [id number]  HPI 66 y.o. y/o male, smoker, with a PMH of ETOH, HTN, Prostate Cancer admitted to [facility] 02/24/2012 with nausea / vomiting and abdominal pain secondary to small bowel obstruction due to adhesions., with a PMH of ETOH, HTN, Prostate Cancer admitted to [facility] 02/24/2012 with nausea / vomiting and abdominal pain secondary to small bowel obstruction due to adhesions. Underwent exploratory laparotomy with small bowel resection 5/10. Developed delirium post-op. Transferred to [facility] 5/14 for respiratory failure requiring intubation. Developed abdominal fascial dehiscence and taken back to OR 5/16. Failed extubation, and required tracheostomy 5/22. Weaned to [facility] 6/21. He was noted to have dysphagia and was evaluated by Speech Therapy and found to have a large Zenker diverticulum. He is status post Endoscopic Zenker diverticulectomy 7/2. Swallowing / diet was advanced post procedure. He was decanulated 8/7, PEG tube out 8/1. Presents now for eval of his COPD.   He was d/c to home on albuterol nebs q6h and pulmicort nebs q12h. Tells me that he is having some exertional SOB. He does not have any O2 at this time.   ROV 08/10/12 -- Complicated hospitalization as above, presents back today for COPD. PFT today with moderately severe AFL, restriction on volumes, decreased DLCO that corrects for Va. Hypoxemia identified last week - started O2. On albuterol prn + pulmicort. He does still have DOE. Wearing his O2 reliably.      Objective:   Physical Exam Filed Vitals:   08/10/12 1409  BP: 130/80  Pulse: 75  Temp: 98 F (36.7 C)   Gen: Pleasant, thin, in no distress,  normal affect  ENT: No lesions,  mouth clear,  oropharynx clear, no postnasal drip  Neck: No JVD, no TMG, no carotid bruits, trach site closing. Stoma has closed, looks good  Lungs: No use of accessory muscles, distant, clear without rales or rhonchi  Cardiovascular: RRR, heart sounds normal, no murmur or gallops, no peripheral edema  Musculoskeletal: No deformities, no cyanosis or clubbing  Neuro: alert, non  focal  Skin: Warm, no lesions or rashes     Assessment & Plan:  COPD (chronic obstructive pulmonary disease) PFT confirm moderately severe AFL.  - add spiriva today to pulmicort + albuterol prn - consider changing further to add LABA/ICS next time - O2 with exertion.  - rov 2 months  Tracheostomy status Trach site and stoma closed, looks good

## 2012-10-09 ENCOUNTER — Ambulatory Visit: Payer: Medicare Other | Admitting: Emergency Medicine

## 2012-10-12 ENCOUNTER — Ambulatory Visit (INDEPENDENT_AMBULATORY_CARE_PROVIDER_SITE_OTHER): Payer: Medicare Other | Admitting: Emergency Medicine

## 2012-10-12 ENCOUNTER — Encounter: Payer: Self-pay | Admitting: Emergency Medicine

## 2012-10-12 VITALS — BP 140/98 | HR 95 | Temp 98.0°F | Ht 75.0 in | Wt 194.0 lb

## 2012-10-12 DIAGNOSIS — J4489 Other specified chronic obstructive pulmonary disease: Secondary | ICD-10-CM

## 2012-10-12 DIAGNOSIS — J449 Chronic obstructive pulmonary disease, unspecified: Secondary | ICD-10-CM

## 2012-10-12 MED ORDER — BUDESONIDE-FORMOTEROL FUMARATE 160-4.5 MCG/ACT IN AERO: 2.0000 | INHALATION_SPRAY | Freq: Two times a day (BID) | RESPIRATORY_TRACT | Status: DC

## 2012-10-12 NOTE — Assessment & Plan Note (Signed)
-   repeat his walking oximetry to see if he still requires - continue spiriva - trial symbicort bid - albuterol prn - rov 6 weeks

## 2012-10-12 NOTE — Patient Instructions (Addendum)
Please continue your Spiriva Stop Pulmicort nebulizer for now Start Symbicort 160/4.14mcg, 2 puffs twice a day. Rinse your mouth out after this medication Use albuterol as needed Walking oximetry today Follow with Dr Delton Coombes in 6 weeks or sooner if you have any problems

## 2012-10-12 NOTE — Progress Notes (Signed)
  Subjective:    Patient ID: Michael Ellison, male    DOB: 17-Sep-1946, 66 y.o.   MRN: [id number]  HPI 66 y.o. y/o male, smoker, with a PMH of ETOH, HTN, Prostate Cancer admitted to [facility] 02/24/2012 with nausea / vomiting and abdominal pain secondary to small bowel obstruction due to adhesions. Underwent exploratory laparotomy with small bowel resection 5/10. Developed delirium post-op. Transferred to [facility] 5/14 for respiratory failure requiring intubation. Developed abdominal fascial dehiscence and taken back to OR 5/16. Failed extubation, and required tracheostomy 5/22. Weaned to [facility] 6/21. He was noted to have dysphagia and was evaluated by Speech Therapy and found to have a large Zenker diverticulum. He is status post Endoscopic Zenker diverticulectomy 7/2. Swallowing / diet was advanced post procedure. He was decanulated 8/7, PEG tube out 8/1. Presents now for eval of his COPD.   He was d/c to home on albuterol nebs q6h and pulmicort nebs q12h. Tells me that he is having some exertional SOB. He does not have any O2 at this time.   ROV 65/24/13 -- Complicated hospitalization as above, presents back today for COPD. PFT today with moderately severe AFL, restriction on volumes, decreased DLCO that corrects for Va. Hypoxemia identified last week - started O2. On albuterol prn + pulmicort. He does still have DOE. Wearing his O2 reliably.   ROV 65/26/13 -- COPD with moderately severe AFL. He is only using his O2 prn. He is on Spiriva, started 10/13. Also doing pulmicort + albuterol. He can't tell much difference, but he is less congested, having less cough. He uses albuterol about 2x a day.  He is active, walks every morning.      Objective:   Physical Exam Filed Vitals:   10/12/12 1336  BP: 140/98  Pulse: 95  Temp: 98 F (36.7 C)   Gen: Pleasant, thin, in no distress,  normal affect  ENT: No lesions,  mouth clear,  oropharynx clear, no postnasal drip  Neck: No JVD, no TMG, no carotid bruits, trach  site closing. Stoma has closed, looks good  Lungs: No use of accessory muscles, distant, clear without rales or rhonchi  Cardiovascular: RRR, heart sounds normal, no murmur or gallops, no peripheral edema  Musculoskeletal: No deformities, no cyanosis or clubbing  Neuro: alert, non focal  Skin: Warm, no lesions or rashes     Assessment & Plan:  COPD (chronic obstructive pulmonary disease) - repeat his walking oximetry to see if he still requires - continue spiriva - trial symbicort bid - albuterol prn - rov 6 weeks

## 2012-11-21 ENCOUNTER — Ambulatory Visit (INDEPENDENT_AMBULATORY_CARE_PROVIDER_SITE_OTHER): Payer: Medicare Other | Admitting: General Surgery

## 2012-11-21 ENCOUNTER — Encounter (INDEPENDENT_AMBULATORY_CARE_PROVIDER_SITE_OTHER): Payer: Self-pay | Admitting: General Surgery

## 2012-11-21 VITALS — BP 158/90 | HR 72 | Temp 98.4°F | Resp 12 | Ht 75.0 in | Wt 203.6 lb

## 2012-11-21 DIAGNOSIS — K432 Incisional hernia without obstruction or gangrene: Secondary | ICD-10-CM | POA: Insufficient documentation

## 2012-11-21 NOTE — Progress Notes (Signed)
The patient's ventral incisional hernia has increased in size. Outside of the size increase is no  More symptomatic.  The patient and his daughter would like to go ahead and have the ventral hernia repair soon as possible. This would have to be an open ventral hernia repair with either biologic prosthetic or synthetic mesh  His hernia is soft and easily reducible. We will go ahead and schedule him as soon as possible.  The patient is still intermittently using oxygen at home. He is being treated for his chronic obstructive pulmonary disease by Dr. Delton Coombes. He is being weaned off the oxygen. He has quit smoking cigarettes. This will be a factor in his postoperative recovery. We will get a preoperative anesthesia evaluation.

## 2012-11-28 ENCOUNTER — Ambulatory Visit: Payer: Medicare Other | Admitting: Emergency Medicine

## 2012-12-01 ENCOUNTER — Ambulatory Visit: Payer: Medicare Other | Admitting: Emergency Medicine

## 2012-12-12 ENCOUNTER — Encounter (HOSPITAL_COMMUNITY): Payer: Self-pay | Admitting: Pharmacy Technician

## 2012-12-22 ENCOUNTER — Ambulatory Visit: Payer: Medicare Other | Admitting: Emergency Medicine

## 2012-12-22 ENCOUNTER — Encounter (HOSPITAL_COMMUNITY): Payer: Self-pay

## 2012-12-22 ENCOUNTER — Inpatient Hospital Stay (HOSPITAL_COMMUNITY)
Admission: RE | Admit: 2012-12-22 | Discharge: 2012-12-22 | Disposition: A | Discharge status: Home / Self Care | Payer: Medicare Other | Source: Ambulatory Visit

## 2012-12-22 HISTORY — DX: Chronic obstructive pulmonary disease, unspecified: J44.9

## 2012-12-22 HISTORY — DX: Depression, unspecified: F32.A

## 2012-12-22 HISTORY — DX: Shortness of breath: R06.02

## 2012-12-22 HISTORY — DX: Major depressive disorder, single episode, unspecified: F32.9

## 2012-12-22 HISTORY — DX: Unspecified asthma, uncomplicated: J45.909

## 2012-12-22 NOTE — Progress Notes (Signed)
Anesthesia Note:  Patient is scheduled for ventral hernia repair by Dr. Lindie Spruce on 12/25/12.  Dr. Lindie Spruce had ordered an anesthesia consult, but this will have to be done on the day of surgery since patient could not make his PAT appointment due to inclement weather.  In brief, patient has a history of HTN, COPD, asthma, prostate cancer.  He was admitted to [facility] on 02/24/12 for N/V due to SBO and underwent LOA and bowel resection.  He developed delirium and dehiscence post-operatively.  He failed extubation and required tracheostomy on 03/08/13.  He underwent Zenker's diverticulectomy on 04/17/12.  He was decannulated on 05/24/12.  He still uses home oxygen at night time and with exertion.  He is followed by Pulmonologist Dr. Delton Coombes.  (According to Epic, patient has an appointment with Dr. Delton Coombes today, but I'm unsure if he will be able to make the appointment due to inclement weather.)  Currently, the last available note is form 10/12/12 and indicates PFTs in 08/10/12 showed "moderately severe AFL, restriction on volumes, decreased DLCO that corrects for Va."  As of December 2013, patient was "active, walks every morning."  EKG on 03/20/12 showed ST @ 112 with occasional PVCs, LVH, inferior infarct, age undetermined.  Possible inferior infarct was present on EKGs dating back to at least May 2013.  I think he will need a new CXR on the day of surgery.  He will get labs at that time as well.  Anesthesiologist Dr. Gypsy Balsam notified of history--patient will be evaluated by his assigned anesthesiologist on the day of surgery.  Velna Ochs [facility] Short Stay Center/Anesthesiology Phone [phone] 12/22/2012 2:32 PM

## 2012-12-22 NOTE — Progress Notes (Signed)
Informed Megan at [facility] that patient missed his PAT visit due to weather. Pre op done over phone. Anesthesia consult could not be done. Revonda Standard PA will review patient records for Anesthesia but inquired if patient's Pulmonary MD saw patient for surgery.

## 2012-12-22 NOTE — Pre-Procedure Instructions (Signed)
Michael Ellison  12/22/2012   Your procedure is scheduled on:  Monday December 25, 2012  Report to [facility] at 0530 AM.  Call this number if you have problems the morning of surgery: [phone]   Remember:   Do not eat food or drink liquids after midnight.   Take these medicines the morning of surgery with A SIP OF WATER: Amlodipine (Norvasc), Escitalopram (Lexapro), Hydrocodone-Acetaminophen if needed for pain, and use and bring inhalers Symbicort , Albuterol if needed   Do not wear jewelry.  Do not wear lotions, or powders. You may wear deodorant.             Men may shave face and neck.  Do not bring valuables to the hospital.  Contacts, dentures or bridgework may not be worn into surgery.  Leave suitcase in the car. After surgery it may be brought to your room.  For patients admitted to the hospital, checkout time is 11:00 AM the day of  discharge.   Patients discharged the day of surgery will not be allowed to drive  home.    Special Instructions: Shower using CHG 2 nights before surgery and the night before surgery.  If you shower the day of surgery use CHG.  Use special wash - you have one bottle of CHG for all showers.  You should use approximately 1/3 of the bottle for each shower.   Please read over the following fact sheets that you were given: Pain Booklet, Coughing and Deep Breathing, Blood Transfusion Information, MRSA Information and Surgical Site Infection Prevention

## 2012-12-24 MED ORDER — CEFAZOLIN SODIUM-DEXTROSE 2-3 GM-% IV SOLR
2.0000 g | INTRAVENOUS | Status: AC
  Administered 2012-12-25: 2 g via INTRAVENOUS
  Filled 2012-12-24: qty 50

## 2012-12-24 MED ORDER — CHLORHEXIDINE GLUCONATE 4 % EX LIQD: 1.0000 "application " | Freq: Once | CUTANEOUS | Status: DC

## 2012-12-25 ENCOUNTER — Inpatient Hospital Stay (HOSPITAL_COMMUNITY): Payer: Medicare Other | Admitting: Anesthesiology

## 2012-12-25 ENCOUNTER — Inpatient Hospital Stay (HOSPITAL_COMMUNITY): Payer: Medicare Other

## 2012-12-25 ENCOUNTER — Inpatient Hospital Stay (HOSPITAL_COMMUNITY)
Admission: RE | Admit: 2012-12-25 | Discharge: 2012-12-31 | DRG: 354 | Disposition: A | Discharge status: Home Health | Payer: Medicare Other | Source: Ambulatory Visit | Attending: General Surgery | Admitting: General Surgery

## 2012-12-25 ENCOUNTER — Encounter (HOSPITAL_COMMUNITY): Payer: Self-pay | Admitting: Vascular Surgery

## 2012-12-25 ENCOUNTER — Encounter (HOSPITAL_COMMUNITY)
Admission: RE | Disposition: A | Discharge status: Home Health | Payer: Self-pay | Source: Ambulatory Visit | Attending: General Surgery

## 2012-12-25 ENCOUNTER — Encounter (HOSPITAL_COMMUNITY): Payer: Self-pay | Admitting: Anesthesiology

## 2012-12-25 DIAGNOSIS — Y9210 Unspecified residential institution as the place of occurrence of the external cause: Secondary | ICD-10-CM | POA: Diagnosis not present

## 2012-12-25 DIAGNOSIS — Y834 Other reconstructive surgery as the cause of abnormal reaction of the patient, or of later complication, without mention of misadventure at the time of the procedure: Secondary | ICD-10-CM | POA: Diagnosis not present

## 2012-12-25 DIAGNOSIS — K439 Ventral hernia without obstruction or gangrene: Principal | ICD-10-CM | POA: Diagnosis present

## 2012-12-25 DIAGNOSIS — S36114A Minor laceration of liver, initial encounter: Secondary | ICD-10-CM | POA: Diagnosis not present

## 2012-12-25 DIAGNOSIS — Z79899 Other long term (current) drug therapy: Secondary | ICD-10-CM

## 2012-12-25 DIAGNOSIS — I10 Essential (primary) hypertension: Secondary | ICD-10-CM | POA: Diagnosis present

## 2012-12-25 DIAGNOSIS — J4489 Other specified chronic obstructive pulmonary disease: Secondary | ICD-10-CM | POA: Diagnosis present

## 2012-12-25 DIAGNOSIS — Z87891 Personal history of nicotine dependence: Secondary | ICD-10-CM

## 2012-12-25 DIAGNOSIS — F3289 Other specified depressive episodes: Secondary | ICD-10-CM | POA: Diagnosis present

## 2012-12-25 DIAGNOSIS — Z8546 Personal history of malignant neoplasm of prostate: Secondary | ICD-10-CM

## 2012-12-25 HISTORY — PX: INSERTION OF MESH: SHX5868

## 2012-12-25 HISTORY — PX: VENTRAL HERNIA REPAIR: SHX424

## 2012-12-25 LAB — COMPREHENSIVE METABOLIC PANEL
ALT: 17 U/L (ref 0–53)
Albumin: 3.8 g/dL (ref 3.5–5.2)
Alkaline Phosphatase: 57 U/L (ref 39–117)
Calcium: 9.4 mg/dL (ref 8.4–10.5)
Potassium: 4.2 mEq/L (ref 3.5–5.1)
Sodium: 138 mEq/L (ref 135–145)
Total Protein: 7.4 g/dL (ref 6.0–8.3)

## 2012-12-25 LAB — CBC WITH DIFFERENTIAL/PLATELET
Eosinophils Absolute: 0.2 10*3/uL (ref 0.0–0.7)
Hemoglobin: 15.4 g/dL (ref 13.0–17.0)
Lymphocytes Relative: 32 % (ref 12–46)
Lymphs Abs: 3 10*3/uL (ref 0.7–4.0)
MCH: 28.3 pg (ref 26.0–34.0)
Monocytes Relative: 6 % (ref 3–12)
Neutro Abs: 5.4 10*3/uL (ref 1.7–7.7)
Neutrophils Relative %: 59 % (ref 43–77)
Platelets: 229 10*3/uL (ref 150–400)
RBC: 5.45 MIL/uL (ref 4.22–5.81)
WBC: 9.2 10*3/uL (ref 4.0–10.5)

## 2012-12-25 LAB — CBC
HCT: 40.4 % (ref 39.0–52.0)
Hemoglobin: 13.5 g/dL (ref 13.0–17.0)
MCV: 83.3 fL (ref 78.0–100.0)
RDW: 14.6 % (ref 11.5–15.5)
WBC: 26.7 10*3/uL — ABNORMAL HIGH (ref 4.0–10.5)

## 2012-12-25 LAB — CREATININE, SERUM
GFR calc Af Amer: 56 mL/min — ABNORMAL LOW (ref >90)
GFR calc non Af Amer: 48 mL/min — ABNORMAL LOW (ref >90)

## 2012-12-25 LAB — SURGICAL PCR SCREEN: MRSA, PCR: NEGATIVE

## 2012-12-25 LAB — PROTIME-INR: Prothrombin Time: 14.2 seconds (ref 11.6–15.2)

## 2012-12-25 SURGERY — REPAIR, HERNIA, VENTRAL
Anesthesia: General | Site: Abdomen | Wound class: Clean

## 2012-12-25 MED ORDER — BUPIVACAINE HCL (PF) 0.25 % IJ SOLN
INTRAMUSCULAR | Status: AC
  Filled 2012-12-25: qty 30

## 2012-12-25 MED ORDER — CEFAZOLIN SODIUM 1-5 GM-% IV SOLN
1.0000 g | Freq: Four times a day (QID) | INTRAVENOUS | Status: AC
  Administered 2012-12-25 – 2012-12-26 (×3): 1 g via INTRAVENOUS
  Filled 2012-12-25 (×3): qty 50

## 2012-12-25 MED ORDER — SODIUM CHLORIDE 0.9 % IV BOLUS (SEPSIS)
250.0000 mL | Freq: Once | INTRAVENOUS | Status: AC
  Administered 2012-12-25: 250 mL via INTRAVENOUS

## 2012-12-25 MED ORDER — BUDESONIDE-FORMOTEROL FUMARATE 160-4.5 MCG/ACT IN AERO
2.0000 | INHALATION_SPRAY | Freq: Two times a day (BID) | RESPIRATORY_TRACT | Status: DC
  Filled 2012-12-25: qty 6

## 2012-12-25 MED ORDER — PROPOFOL 10 MG/ML IV BOLUS
INTRAVENOUS | Status: DC | PRN
  Administered 2012-12-25: 100 mg via INTRAVENOUS

## 2012-12-25 MED ORDER — ESCITALOPRAM OXALATE 10 MG PO TABS
10.0000 mg | ORAL_TABLET | Freq: Every day | ORAL | Status: DC
  Administered 2012-12-26 – 2012-12-31 (×6): 10 mg via ORAL
  Filled 2012-12-25 (×7): qty 1

## 2012-12-25 MED ORDER — GLYCOPYRROLATE 0.2 MG/ML IJ SOLN
INTRAMUSCULAR | Status: DC | PRN
  Administered 2012-12-25: 0.4 mg via INTRAVENOUS

## 2012-12-25 MED ORDER — BUDESONIDE 0.5 MG/2ML IN SUSP
0.5000 mg | Freq: Two times a day (BID) | RESPIRATORY_TRACT | Status: DC
  Administered 2012-12-26 – 2012-12-31 (×8): 0.5 mg via RESPIRATORY_TRACT
  Filled 2012-12-25 (×14): qty 2

## 2012-12-25 MED ORDER — AMLODIPINE BESYLATE 5 MG PO TABS
5.0000 mg | ORAL_TABLET | Freq: Every day | ORAL | Status: DC
  Administered 2012-12-25 – 2012-12-31 (×7): 5 mg via ORAL
  Filled 2012-12-25 (×8): qty 1

## 2012-12-25 MED ORDER — FENTANYL CITRATE 0.05 MG/ML IJ SOLN
INTRAMUSCULAR | Status: DC | PRN
  Administered 2012-12-25: 100 ug via INTRAVENOUS
  Administered 2012-12-25 (×3): 50 ug via INTRAVENOUS
  Administered 2012-12-25: 100 ug via INTRAVENOUS
  Administered 2012-12-25: 50 ug via INTRAVENOUS
  Administered 2012-12-25: 100 ug via INTRAVENOUS

## 2012-12-25 MED ORDER — FUROSEMIDE 40 MG PO TABS
40.0000 mg | ORAL_TABLET | Freq: Every day | ORAL | Status: DC
  Administered 2012-12-25 – 2012-12-31 (×6): 40 mg via ORAL
  Filled 2012-12-25 (×7): qty 1

## 2012-12-25 MED ORDER — MUPIROCIN 2 % EX OINT
TOPICAL_OINTMENT | Freq: Once | CUTANEOUS | Status: AC
  Administered 2012-12-25: 1 via NASAL
  Filled 2012-12-25 (×2): qty 22

## 2012-12-25 MED ORDER — ONDANSETRON HCL 4 MG/2ML IJ SOLN
INTRAMUSCULAR | Status: DC | PRN
  Administered 2012-12-25: 4 mg via INTRAVENOUS

## 2012-12-25 MED ORDER — LACTATED RINGERS IV SOLN
INTRAVENOUS | Status: DC | PRN
  Administered 2012-12-25 (×2): via INTRAVENOUS

## 2012-12-25 MED ORDER — ENOXAPARIN SODIUM 40 MG/0.4ML ~~LOC~~ SOLN
40.0000 mg | SUBCUTANEOUS | Status: DC
  Administered 2012-12-26 – 2012-12-29 (×4): 40 mg via SUBCUTANEOUS
  Filled 2012-12-25 (×6): qty 0.4

## 2012-12-25 MED ORDER — SODIUM CHLORIDE 0.9 % IJ SOLN: 9.0000 mL | INTRAMUSCULAR | Status: DC | PRN

## 2012-12-25 MED ORDER — SUCCINYLCHOLINE CHLORIDE 20 MG/ML IJ SOLN
INTRAMUSCULAR | Status: DC | PRN
  Administered 2012-12-25: 80 mg via INTRAVENOUS

## 2012-12-25 MED ORDER — BUDESONIDE-FORMOTEROL FUMARATE 160-4.5 MCG/ACT IN AERO
2.0000 | INHALATION_SPRAY | Freq: Two times a day (BID) | RESPIRATORY_TRACT | Status: DC
  Administered 2012-12-26 – 2012-12-31 (×11): 2 via RESPIRATORY_TRACT
  Filled 2012-12-25: qty 6

## 2012-12-25 MED ORDER — POVIDONE-IODINE 10 % EX OINT
TOPICAL_OINTMENT | CUTANEOUS | Status: AC
  Filled 2012-12-25: qty 28.35

## 2012-12-25 MED ORDER — NEOSTIGMINE METHYLSULFATE 1 MG/ML IJ SOLN
INTRAMUSCULAR | Status: DC | PRN
  Administered 2012-12-25: 3 mg via INTRAVENOUS

## 2012-12-25 MED ORDER — ROCURONIUM BROMIDE 100 MG/10ML IV SOLN
INTRAVENOUS | Status: DC | PRN
  Administered 2012-12-25: 30 mg via INTRAVENOUS
  Administered 2012-12-25 (×5): 10 mg via INTRAVENOUS

## 2012-12-25 MED ORDER — DIPHENHYDRAMINE HCL 50 MG/ML IJ SOLN: 12.5000 mg | Freq: Four times a day (QID) | INTRAMUSCULAR | Status: DC | PRN

## 2012-12-25 MED ORDER — HYDROMORPHONE HCL PF 1 MG/ML IJ SOLN
INTRAMUSCULAR | Status: AC
  Filled 2012-12-25: qty 2

## 2012-12-25 MED ORDER — 0.9 % SODIUM CHLORIDE (POUR BTL) OPTIME
TOPICAL | Status: DC | PRN
  Administered 2012-12-25 (×2): 1000 mL

## 2012-12-25 MED ORDER — KCL IN DEXTROSE-NACL 20-5-0.45 MEQ/L-%-% IV SOLN
INTRAVENOUS | Status: DC
  Administered 2012-12-25 – 2012-12-26 (×2): via INTRAVENOUS
  Filled 2012-12-25 (×4): qty 1000

## 2012-12-25 MED ORDER — MIDAZOLAM HCL 5 MG/5ML IJ SOLN
INTRAMUSCULAR | Status: DC | PRN
  Administered 2012-12-25 (×2): 1 mg via INTRAVENOUS

## 2012-12-25 MED ORDER — HYDROMORPHONE HCL PF 1 MG/ML IJ SOLN
0.2500 mg | INTRAMUSCULAR | Status: DC | PRN
  Administered 2012-12-25 (×7): 0.5 mg via INTRAVENOUS

## 2012-12-25 MED ORDER — SODIUM CHLORIDE 0.9 % IR SOLN
Status: DC | PRN
  Administered 2012-12-25: 07:00:00

## 2012-12-25 MED ORDER — HYDROCODONE-ACETAMINOPHEN 5-325 MG PO TABS
1.0000 | ORAL_TABLET | ORAL | Status: DC | PRN
  Administered 2012-12-29 – 2012-12-31 (×6): 2 via ORAL
  Filled 2012-12-25 (×8): qty 2

## 2012-12-25 MED ORDER — NALOXONE HCL 0.4 MG/ML IJ SOLN: 0.4000 mg | INTRAMUSCULAR | Status: DC | PRN

## 2012-12-25 MED ORDER — ONDANSETRON HCL 4 MG/2ML IJ SOLN
4.0000 mg | Freq: Four times a day (QID) | INTRAMUSCULAR | Status: DC | PRN
  Administered 2012-12-25 – 2012-12-26 (×2): 4 mg via INTRAVENOUS
  Filled 2012-12-25 (×2): qty 2

## 2012-12-25 MED ORDER — ACETAMINOPHEN 10 MG/ML IV SOLN
1000.0000 mg | Freq: Four times a day (QID) | INTRAVENOUS | Status: AC
  Administered 2012-12-25 – 2012-12-26 (×4): 1000 mg via INTRAVENOUS
  Filled 2012-12-25 (×4): qty 100

## 2012-12-25 MED ORDER — HYDROMORPHONE 0.3 MG/ML IV SOLN
INTRAVENOUS | Status: AC
  Filled 2012-12-25: qty 25

## 2012-12-25 MED ORDER — LABETALOL HCL 5 MG/ML IV SOLN
INTRAVENOUS | Status: DC | PRN
  Administered 2012-12-25 (×3): 5 mg via INTRAVENOUS

## 2012-12-25 MED ORDER — ALBUTEROL SULFATE (5 MG/ML) 0.5% IN NEBU
2.5000 mg | INHALATION_SOLUTION | Freq: Four times a day (QID) | RESPIRATORY_TRACT | Status: DC
  Administered 2012-12-25 – 2012-12-29 (×17): 2.5 mg via RESPIRATORY_TRACT
  Filled 2012-12-25 (×17): qty 0.5

## 2012-12-25 MED ORDER — DIPHENHYDRAMINE HCL 12.5 MG/5ML PO ELIX
12.5000 mg | ORAL_SOLUTION | Freq: Four times a day (QID) | ORAL | Status: DC | PRN
  Filled 2012-12-25: qty 5

## 2012-12-25 MED ORDER — HYDROMORPHONE 0.3 MG/ML IV SOLN
INTRAVENOUS | Status: DC
  Administered 2012-12-25: 3 mg via INTRAVENOUS
  Administered 2012-12-25: 0.6 mg via INTRAVENOUS
  Administered 2012-12-25: 23 mL via INTRAVENOUS
  Administered 2012-12-26: 1.5 mg via INTRAVENOUS
  Administered 2012-12-26: 1.6 mg via INTRAVENOUS
  Administered 2012-12-26: 11:00:00 via INTRAVENOUS
  Administered 2012-12-26: 0.9 mg via INTRAVENOUS
  Administered 2012-12-26: 0.6 mg via INTRAVENOUS
  Administered 2012-12-26: 1.5 mg via INTRAVENOUS
  Administered 2012-12-27: 1.2 mg via INTRAVENOUS
  Administered 2012-12-27: 0.6 mg via INTRAVENOUS
  Administered 2012-12-27: 1.5 mg via INTRAVENOUS
  Filled 2012-12-25: qty 25

## 2012-12-25 MED ORDER — LIDOCAINE HCL (CARDIAC) 20 MG/ML IV SOLN
INTRAVENOUS | Status: DC | PRN
  Administered 2012-12-25: 50 mg via INTRAVENOUS

## 2012-12-25 SURGICAL SUPPLY — 55 items
BAG DECANTER FOR FLEXI CONT (MISCELLANEOUS) ×2 IMPLANT
BINDER ABD UNIV 12 45-62 (WOUND CARE) ×1 IMPLANT
BINDER ABDOMINAL 46IN 62IN (WOUND CARE) ×2
BLADE SURG ROTATE 9660 (MISCELLANEOUS) ×1 IMPLANT
CANISTER SUCTION 2500CC (MISCELLANEOUS) ×2 IMPLANT
CHLORAPREP W/TINT 26ML (MISCELLANEOUS) ×2 IMPLANT
CLOTH BEACON ORANGE TIMEOUT ST (SAFETY) ×2 IMPLANT
COVER SURGICAL LIGHT HANDLE (MISCELLANEOUS) ×2 IMPLANT
DRAPE LAPAROSCOPIC ABDOMINAL (DRAPES) ×2 IMPLANT
DRAPE UTILITY 15X26 W/TAPE STR (DRAPE) ×4 IMPLANT
DRSG PAD ABDOMINAL 8X10 ST (GAUZE/BANDAGES/DRESSINGS) ×2 IMPLANT
ELECT CAUTERY BLADE 6.4 (BLADE) ×2 IMPLANT
ELECT REM PT RETURN 9FT ADLT (ELECTROSURGICAL) ×2
ELECTRODE REM PT RTRN 9FT ADLT (ELECTROSURGICAL) ×1 IMPLANT
GLOVE BIO SURGEON STRL SZ7 (GLOVE) ×2 IMPLANT
GLOVE BIO SURGEON STRL SZ7.5 (GLOVE) ×1 IMPLANT
GLOVE BIOGEL PI IND STRL 7.0 (GLOVE) IMPLANT
GLOVE BIOGEL PI IND STRL 8 (GLOVE) ×1 IMPLANT
GLOVE BIOGEL PI INDICATOR 7.0 (GLOVE) ×1
GLOVE BIOGEL PI INDICATOR 8 (GLOVE) ×2
GLOVE ECLIPSE 7.5 STRL STRAW (GLOVE) ×4 IMPLANT
GOWN PREVENTION PLUS XLARGE (GOWN DISPOSABLE) ×1 IMPLANT
GOWN STRL NON-REIN LRG LVL3 (GOWN DISPOSABLE) ×6 IMPLANT
HEMOSTAT SNOW SURGICEL 2X4 (HEMOSTASIS) ×1 IMPLANT
KIT BASIN OR (CUSTOM PROCEDURE TRAY) ×2 IMPLANT
KIT ROOM TURNOVER OR (KITS) ×2 IMPLANT
MARKER SKIN DUAL TIP RULER LAB (MISCELLANEOUS) ×1 IMPLANT
NDL HYPO 25GX1X1/2 BEV (NEEDLE) ×1 IMPLANT
NEEDLE HYPO 25GX1X1/2 BEV (NEEDLE) IMPLANT
NS IRRIG 1000ML POUR BTL (IV SOLUTION) ×2 IMPLANT
PACK GENERAL/GYN (CUSTOM PROCEDURE TRAY) ×2 IMPLANT
PAD ARMBOARD 7.5X6 YLW CONV (MISCELLANEOUS) ×2 IMPLANT
RETAINER VISCERA MED (MISCELLANEOUS) ×1 IMPLANT
SPONGE GAUZE 4X4 12PLY (GAUZE/BANDAGES/DRESSINGS) ×2 IMPLANT
SPONGE LAP 18X18 X RAY DECT (DISPOSABLE) ×4 IMPLANT
STAPLER VISISTAT 35W (STAPLE) ×1 IMPLANT
SUT ETHIBOND NAB CTX #1 30IN (SUTURE) IMPLANT
SUT ETHILON 2 0 FS 18 (SUTURE) ×2 IMPLANT
SUT MNCRL AB 4-0 PS2 18 (SUTURE) ×2 IMPLANT
SUT NOVA 1 T20/GS 25DT (SUTURE) ×5 IMPLANT
SUT PROLENE 1 CT (SUTURE) IMPLANT
SUT SILK 2 0 (SUTURE) ×2
SUT SILK 2-0 18XBRD TIE 12 (SUTURE) ×1 IMPLANT
SUT SILK 3 0SH CR/8 30 (SUTURE) ×1 IMPLANT
SUT VIC AB 2-0 CT1 27 (SUTURE)
SUT VIC AB 2-0 CT1 TAPERPNT 27 (SUTURE) IMPLANT
SUT VIC AB 3-0 CT1 27 (SUTURE) ×4
SUT VIC AB 3-0 CT1 TAPERPNT 27 (SUTURE) IMPLANT
SUT VICRYL AB 3 0 TIES (SUTURE) IMPLANT
SYR CONTROL 10ML LL (SYRINGE) IMPLANT
TAPE CLOTH SURG 6X10 WHT LF (GAUZE/BANDAGES/DRESSINGS) ×1 IMPLANT
TISSUE MATRIX STRATTICE 20X25 (Tissue) ×1 IMPLANT
TOWEL OR 17X24 6PK STRL BLUE (TOWEL DISPOSABLE) ×2 IMPLANT
TOWEL OR 17X26 10 PK STRL BLUE (TOWEL DISPOSABLE) ×2 IMPLANT
TRAY FOLEY CATH 14FRSI W/METER (CATHETERS) ×1 IMPLANT

## 2012-12-25 NOTE — Progress Notes (Signed)
Pt spit up a small amount of blood-tinged mucous x1. Belly distended and tender. Dr. Lindie Spruce made aware. Last BP at 1800 was 99/76 with a HR of 112. Orders given for NS Bolus of 250cc. Bolus started and patient made aware. Will continue to monitor.

## 2012-12-25 NOTE — Anesthesia Postprocedure Evaluation (Signed)
  Anesthesia Post-op Note  Patient: Michael Ellison  Procedure(s) Performed: Procedure(s): HERNIA REPAIR VENTRAL ADULT (N/A) INSERTION OF MESH (N/A)  Patient Location: PACU  Anesthesia Type:General  Level of Consciousness: awake  Airway and Oxygen Therapy: Patient Spontanous Breathing  Post-op Pain: mild  Post-op Assessment: Post-op Vital signs reviewed  Post-op Vital Signs: Reviewed  Complications: No apparent anesthesia complications

## 2012-12-25 NOTE — Op Note (Signed)
OPERATIVE REPORT  DATE OF OPERATION: 12/25/2012  PATIENT:  Michael Ellison  67 y.o. male  PRE-OPERATIVE DIAGNOSIS:  SYMPTOMATIC VENTRAL HERNIA   POST-OPERATIVE DIAGNOSIS:  SYMPTOMATIC VENTRAL HERNIA   PROCEDURE:  Procedure(s): HERNIA REPAIR VENTRAL ADULT INSERTION OF Strattice biological prosthetic  SURGEON:  Surgeon(s): Cherylynn Ridges, MD Romie Levee, MD  ASSISTANT: Theotis Barrio  ANESTHESIA:   general  EBL: 100 ml  BLOOD ADMINISTERED: none  DRAINS: Nasogastric Tube, Urinary Catheter (Foley) and (2) Blake drain(s) in the subcutaneous space   SPECIMEN:  No Specimen  COUNTS CORRECT:  YES  PROCEDURE DETAILS: The patient was taken to the operating room and placed on the table in the supine position. After an adequate general endotracheal anesthetic was administered he was prepped and draped in usual sterile manner exposing his entire abdomen.  The patient had a large central hernia. After a proper time out was performed identifying the patient and the procedure to be performed a midline incision was made excising a large midline scar that the patient had intact. We did not have to remove the umbilicus for this procedure.  A significant portion of the time was spent in getting into the peritoneal cavity safely. There were omental and bowel adhesions to the intra-abdominal wall which were taken down safely. One small area of serosal burn was covered with 3-0 silk pop-off sutures but there were no full-thickness enterotomies made.  We circumferentially removed all the adhesions from the anterior abdominal wall up to the falciform ligament.  There was a tear of the liver capsule at the falciform ligament that required electrocautery and also the placement of some Surgicel snow coagulant.  Once this was controlled, and we had circumferential release of adhesions from intra-abdominal wall, circumferential fascial flaps were made using electrocautery controlling bleeding with electrocautery.  Once we had adequate release of the fascia, relaxing incisions were made at the lateral margin of the rectus sheath.. Component separation laterally was done along the rectus lateral margin bilaterally. We then used a piece of Strattice biological mesh measuring 20 x 25 cm in size. We attached it to the underlay portion of the fascia using interrupted #1 Novafil placed as interrupted horizontal mattress sutures. This was done circumferentially and the biologic prosthetic was cut to conform to the wound. Once we had to attach it circumferentially taking care not to injure the bowel in the process we tied off the sutures. We then closed the upper portion of the fascia on top of the biologic using interrupted figure-of-eight stitches of #1 Novafil. A central portion of the biologic was left open and uncovered by fascia measuring approximately 11 x 7 cm in size. The majority of the biologic was underlay and measured total of approximately 20 x 25 cm.  Once the fascia was closed we placed 2 #19 mm Blake drains in the subcutaneous tissue and sutured in place with 2-0 nylon. Once this was done the subcutaneous tissues close on top of the drains using running 3-0 Vicryl suture. We then closed the skin using stainless steel staples. All needle counts, sponge counts times and instrument counts were correct.  Betadine ointment and 4 x 4 gauze and abdominal binder were placed on top of the dry midline wound dressing.  PATIENT DISPOSITION:  PACU - hemodynamically stable.   Michael Ellison O 3/10/201410:10 AM

## 2012-12-25 NOTE — H&P (Signed)
Michael Ellison is an 67 y.o. male.   Chief Complaint: Large ventral hernia HPI: Patient had sepsis after exploratory laparotomy last year, transferred to [facility], underwent multiple operations for abdominal sepsis, now left with large ventral hernia.  Is on home oxygen for exacerbation of COPD with his last significant hospitalization.  Past Medical History  Diagnosis Date  . Hypertension   . Cancer   . Cancer of prostate   . Depression   . Asthma   . Shortness of breath   . COPD (chronic obstructive pulmonary disease)     home O2    Past Surgical History  Procedure Laterality Date  . Hernia repair    . Prostate biopsy    . Robotic prostate surgery  2011  . Laparotomy  02/25/2012    Procedure: EXPLORATORY LAPAROTOMY;  Surgeon: [name], MD;  Location: [facility];  Service: General;  Laterality: N/A;  . Bowel resection  02/25/2012    Procedure: SMALL BOWEL RESECTION;  Surgeon: [name], MD;  Location: [facility];  Service: General;;  . Wound debridement  03/02/2012    Procedure: DEBRIDEMENT CLOSURE/ABDOMINAL WOUND;  Surgeon: Cherylynn Ridges, MD;  Location: [facility] OR;  Service: General;  Laterality: N/A;  . Tracheostomy  03/2012    Family History  Problem Relation Age of Onset  . Pseudochol deficiency Neg Hx   . Anesthesia problems Neg Hx   . Hypotension Neg Hx   . Malignant hyperthermia Neg Hx    Social History:  reports that he quit smoking about 10 months ago. His smoking use included Cigarettes. He has a 66 pack-year smoking history. He has never used smokeless tobacco. He reports that  drinks alcohol. He reports that he does not use illicit drugs.  Allergies:  Allergies  Allergen Reactions  . Lorazepam Other (See Comments)    Severe agitated delirium. Tolerates midazolam and other benzo's    Medications Prior to Admission  Medication Sig Dispense Refill  . albuterol (PROVENTIL) (5 MG/ML) 0.5% nebulizer solution Take 2.5 mg by nebulization every 6 (six) hours. AND Q3 PRN  SOB or Wheezing      . amLODipine (NORVASC) 5 MG tablet Take 5 mg by mouth daily.      . budesonide (PULMICORT) 0.5 MG/2ML nebulizer solution Take 0.5 mg by nebulization 2 (two) times daily.      . budesonide-formoterol (SYMBICORT) 160-4.5 MCG/ACT inhaler Inhale 2 puffs into the lungs 2 (two) times daily.      Marland Kitchen escitalopram (LEXAPRO) 10 MG tablet Take 10 mg by mouth daily.      . furosemide (LASIX) 40 MG tablet Take 40 mg by mouth daily.      Marland Kitchen HYDROcodone-acetaminophen (VICODIN) 5-500 MG per tablet Take 1 tablet by mouth every 6 (six) hours as needed for pain.      Marland Kitchen ibuprofen (ADVIL,MOTRIN) 200 MG tablet Take 200 mg by mouth every 6 (six) hours as needed for pain.       . [DISCONTINUED] albuterol (PROVENTIL) (5 MG/ML) 0.5% nebulizer solution Take 0.5 mLs (2.5 mg total) by nebulization every 6 (six) hours. AND Q3 PRN SOB or Wheezing  20 mL    . [DISCONTINUED] budesonide (PULMICORT) 0.5 MG/2ML nebulizer solution Take 2 mLs (0.5 mg total) by nebulization 2 (two) times daily.      . [DISCONTINUED] budesonide-formoterol (SYMBICORT) 160-4.5 MCG/ACT inhaler Inhale 2 puffs into the lungs 2 (two) times daily.  1 Inhaler  6  . [DISCONTINUED] HYDROcodone-acetaminophen (VICODIN) 5-500 MG per tablet  Take 25 tablets by mouth as needed.  25 tablet  0    Results for orders placed during the hospital encounter of 12/25/12 (from the past 48 hour(s))  CBC WITH DIFFERENTIAL     Status: None   Collection Time    12/25/12  6:25 AM      Result Value Range   WBC 9.2  4.0 - 10.5 K/uL   RBC 5.45  4.22 - 5.81 MIL/uL   Hemoglobin 15.4  13.0 - 17.0 g/dL   HCT 96.0  45.4 - 09.8 %   MCV 81.1  78.0 - 100.0 fL   MCH 28.3  26.0 - 34.0 pg   MCHC 34.8  30.0 - 36.0 g/dL   RDW 11.9  14.7 - 82.9 %   Platelets 229  150 - 400 K/uL   Neutrophils Relative 59  43 - 77 %   Neutro Abs 5.4  1.7 - 7.7 K/uL   Lymphocytes Relative 32  12 - 46 %   Lymphs Abs 3.0  0.7 - 4.0 K/uL   Monocytes Relative 6  3 - 12 %   Monocytes Absolute  0.6  0.1 - 1.0 K/uL   Eosinophils Relative 2  0 - 5 %   Eosinophils Absolute 0.2  0.0 - 0.7 K/uL   Basophils Relative 1  0 - 1 %   Basophils Absolute 0.1  0.0 - 0.1 K/uL  PROTIME-INR     Status: None   Collection Time    12/25/12  6:25 AM      Result Value Range   Prothrombin Time 14.2  11.6 - 15.2 seconds   INR 1.11  0.00 - 1.49  TYPE AND SCREEN     Status: None   Collection Time    12/25/12  6:25 AM      Result Value Range   ABO/RH(D) A POS     Antibody Screen PENDING     Sample Expiration 12/28/2012     No results found.  Review of Systems  Constitutional: Negative.   Eyes: Negative.   Respiratory: Positive for cough and shortness of breath (On home oxygen).   Cardiovascular: Positive for chest pain (No diaphoresis, no heaviness). Negative for palpitations, orthopnea and claudication.  Skin: Negative.   Neurological: Negative.   Endo/Heme/Allergies: Negative.   Psychiatric/Behavioral: Negative.     Blood pressure 142/98, pulse 75, temperature 97.1 F (36.2 C), temperature source Oral, resp. rate 18, SpO2 97.00%. Physical Exam  Constitutional: He is oriented to person, place, and time. He appears well-developed and well-nourished.  HENT:  Head: Normocephalic and atraumatic.  Neck:    Cardiovascular: Normal rate, regular rhythm and normal heart sounds.   Respiratory: Effort normal. He has wheezes. He has no rales. He exhibits no tenderness.  GI: Soft. Bowel sounds are normal. He exhibits distension and mass (large central ventral hernia). There is no tenderness. There is no rebound. A hernia is present. Hernia confirmed positive in the ventral area (large central).    Musculoskeletal: Normal range of motion.  Neurological: He is alert and oriented to person, place, and time. He has normal reflexes.  Skin: Skin is warm and dry.  Psychiatric: He has a normal mood and affect. His behavior is normal. Judgment and thought content normal.     Assessment/Plan Large  central ventral hernia COPD  Repair with biological prosthetic.  Cherylynn Ridges 12/25/2012, 7:12 AM

## 2012-12-25 NOTE — Progress Notes (Signed)
NGT was coiled in back of pt's throat.  Unable to uncoil and NGT D/C'd.  MD notified and stated to leave NGT out.  Pt to have ice chips only.  Order entered.  Michael Ellison

## 2012-12-25 NOTE — Preoperative (Signed)
Beta Blockers   Reason not to administer Beta Blockers:Not Applicable 

## 2012-12-25 NOTE — Transfer of Care (Signed)
Immediate Anesthesia Transfer of Care Note  Patient: Michael Ellison  Procedure(s) Performed: Procedure(s): HERNIA REPAIR VENTRAL ADULT (N/A) INSERTION OF MESH (N/A)  Patient Location: PACU  Anesthesia Type:General  Level of Consciousness: awake, alert  and oriented  Airway & Oxygen Therapy: Patient Spontanous Breathing and Patient connected to nasal cannula oxygen  Post-op Assessment: Report given to PACU RN and Post -op Vital signs reviewed and stable  Post vital signs: Reviewed and stable  Complications: No apparent anesthesia complications

## 2012-12-25 NOTE — Anesthesia Preprocedure Evaluation (Addendum)
Anesthesia Evaluation  Patient identified by MRN, date of birth, ID band Patient awake    Reviewed: Allergy & Precautions, H&P , NPO status , Patient's Chart, lab work & pertinent test results  History of Anesthesia Complications Negative for: history of anesthetic complications  Airway Mallampati: II TM Distance: >3 FB Neck ROM: Limited    Dental  (+) Teeth Intact, Dental Advisory Given, Chipped and Poor Dentition,    Pulmonary shortness of breath, asthma , pneumonia -, resolved, COPD COPD inhaler and oxygen dependent,          Cardiovascular hypertension, Pt. on medications Rhythm:Regular Rate:Normal     Neuro/Psych PSYCHIATRIC DISORDERS Depression    GI/Hepatic Neg liver ROS, GERD-  ,  Endo/Other  negative endocrine ROS  Renal/GU negative Renal ROS     Musculoskeletal   Abdominal   Peds  Hematology   Anesthesia Other Findings   Reproductive/Obstetrics                        Anesthesia Physical Anesthesia Plan  ASA: III  Anesthesia Plan: General   Post-op Pain Management:    Induction: Rapid sequence and Cricoid pressure planned  Airway Management Planned: Oral ETT  Additional Equipment:   Intra-op Plan:   Post-operative Plan: Extubation in OR  Informed Consent: I have reviewed the patients History and Physical, chart, labs and discussed the procedure including the risks, benefits and alternatives for the proposed anesthesia with the patient or authorized representative who has indicated his/her understanding and acceptance.   Dental advisory given  Plan Discussed with: CRNA, Surgeon and Anesthesiologist  Anesthesia Plan Comments:         Anesthesia Quick Evaluation

## 2012-12-26 LAB — BASIC METABOLIC PANEL
BUN: 34 mg/dL — ABNORMAL HIGH (ref 6–23)
CO2: 24 mEq/L (ref 19–32)
CO2: 24 mEq/L (ref 19–32)
Chloride: 105 mEq/L (ref 96–112)
Chloride: 105 mEq/L (ref 96–112)
Creatinine, Ser: 2.03 mg/dL — ABNORMAL HIGH (ref 0.50–1.35)
Creatinine, Ser: 1.9 mg/dL — ABNORMAL HIGH (ref 0.50–1.35)
GFR calc Af Amer: 38 mL/min — ABNORMAL LOW (ref >90)
GFR calc Af Amer: 41 mL/min — ABNORMAL LOW (ref >90)
Glucose, Bld: 141 mg/dL — ABNORMAL HIGH (ref 70–99)
Potassium: 6 mEq/L — ABNORMAL HIGH (ref 3.5–5.1)
Potassium: 5.1 mEq/L (ref 3.5–5.1)

## 2012-12-26 LAB — CBC
HCT: 34.4 % — ABNORMAL LOW (ref 39.0–52.0)
Hemoglobin: 11.5 g/dL — ABNORMAL LOW (ref 13.0–17.0)
MCV: 81.1 fL (ref 78.0–100.0)
RBC: 4.24 MIL/uL (ref 4.22–5.81)
WBC: 15.1 10*3/uL — ABNORMAL HIGH (ref 4.0–10.5)

## 2012-12-26 MED ORDER — SODIUM CHLORIDE 0.9 % IV BOLUS (SEPSIS)
500.0000 mL | Freq: Once | INTRAVENOUS | Status: AC
  Administered 2012-12-26: 500 mL via INTRAVENOUS

## 2012-12-26 MED ORDER — FUROSEMIDE 10 MG/ML IJ SOLN
20.0000 mg | Freq: Once | INTRAMUSCULAR | Status: AC
  Administered 2012-12-26: 20 mg via INTRAVENOUS
  Filled 2012-12-26: qty 2

## 2012-12-26 MED ORDER — DEXTROSE-NACL 5-0.45 % IV SOLN
INTRAVENOUS | Status: DC
  Administered 2012-12-26 – 2012-12-28 (×5): via INTRAVENOUS

## 2012-12-26 NOTE — Clinical Documentation Improvement (Signed)
RESPIRATORY FAILURE DOCUMENTATION CLARIFICATION QUERY   THIS DOCUMENT IS NOT A PERMANENT PART OF THE MEDICAL RECORD  TO RESPOND TO THE THIS QUERY, FOLLOW THE INSTRUCTIONS BELOW:  1. If needed, update documentation for the patient's encounter via the notes activity.  2. Access this query again and click edit on the In [employer].  3. After updating, or not, click F2 to complete all highlighted (required) fields concerning your review. Select "additional documentation in the medical record" OR "no additional documentation provided".  4. Click Sign note button.  5. The deficiency will fall out of your In Basket *Please let us know if you are not able to complete this workflow by phone or e-mail (listed below).  Please update your documentation within the medical record to reflect your response to this query.                                                                                    12/26/12  Dear Dr. Lindie Spruce Marton Redwood,  In a better effort to capture your patient's severity of illness, reflect appropriate length of stay and utilization of resources, a review of the patient medical record has revealed the following indicators.    Based on your clinical judgment, please clarify and document in a progress note and/or discharge summary the clinical condition associated with the following supporting information:  In responding to this query please exercise your independent judgment.  The fact that a query is asked, does not imply that any particular answer is desired or expected.  Possible Clinical Conditions?  Chronic Respiratory Failure  Acute on Chronic Respiratory Failure  Other Condition  Cannot Clinically Determine    Supporting Information:  Risk Factors:(As per notes) "HERNIA REPAIR VENTRAL ADULT INSERTION OF MESH"  Signs & Symptoms:Is on home oxygen for exacerbation of COPD    Radiology: CXR  3-10 IMPRESSION:  1. Bullous changes in both lung apices. No active  lung disease.  Stable mild cardiomegaly.  2. Nodular opacity on the lateral view may be due to overlapping  bony and vascular structures but recommend attention to this area  on follow-up chest x-ray   Treatment: Oxygen: O2 concentrations: 4L/M  O2 Mode: ([place])  Respiratory Treatment: (As per notes)   Treatment: O2/ PER [place] @ 4L/min budesonide (PULMICORT) 0.5 MG/2ML nebulizer solution [[id number]]   Order Details    Dose: 0.5 mg Route: Nebulization Frequency: 2 times daily     albuterol (PROVENTIL) (5 MG/ML) 0.5% nebulizer solution [[id number]]   Order Details    Dose: 2.5 mg Route: Nebulization Frequency: Every 6 hours                      You may use possible, probable, or suspect with inpatient documentation. possible, probable, suspected diagnoses MUST be documented at the time of discharge  Reviewed:  no additional documentation provided The patient has chronic respiratory disease not failure.  Thank You,  Joanette Gula Delk, RN, BSN, CCDS Clinical Documentation Specialist: [phone] Pager [facility]

## 2012-12-26 NOTE — Progress Notes (Signed)
MD on call, Lindie Spruce made aware of patient's low blood pressure. 86/62 manual. Orders given for a NS bolus of 500 cc. Patient informed and bolus given

## 2012-12-26 NOTE — Progress Notes (Signed)
GS Progress Note Subjective: Patient is doing much better this morning.  Got short of breath through the night after a couple of saline boluses.  Subsequently given IV lasix and diuresis resolved his SOB.  Objective: Vital signs in last 24 hours: Temp:  [96.1 F (35.6 C)-98.2 F (36.8 C)] 97.5 F (36.4 C) (03/10 2150) Pulse Rate:  [72-112] 92 (03/11 0406) Resp:  [15-30] 24 (03/11 0406) BP: (86-158)/(62-104) 124/79 mmHg (03/11 0406) SpO2:  [91 %-100 %] 99 % (03/11 0406) Weight:  [92.08 kg (203 lb)] 92.08 kg (203 lb) (03/10 1314) Last BM Date: 12/24/12  Intake/Output from previous day: 03/10 0701 - 03/11 0700 In: 4030 [P.O.:30; I.V.:3250] Out: 1790 [Urine:1035; Drains:655; Blood:100] Intake/Output this shift: Total I/O In: 2400 [I.V.:1650; Other:750] Out: 1355 [Urine:850; Drains:505]  Lungs: Clear to auscultation, but patient splinting a small amount.  Pain much better controlled.  Abd: Soft, binder in place.  Drains have put out more than 655cc.  Extremities: No clinical signs of DVT  Neuro: Intact  Lab Results: CBC   Recent Labs  12/25/12 0625 12/25/12 1533  WBC 9.2 26.7*  HGB 15.4 13.5  HCT 44.2 40.4  PLT 229 279   BMET  Recent Labs  12/25/12 0625 12/25/12 1533  NA 138  --   K 4.2  --   CL 106  --   CO2 24  --   GLUCOSE 99  --   BUN 20  --   CREATININE 0.91 1.48*  CALCIUM 9.4  --    PT/INR  Recent Labs  12/25/12 0625  LABPROT 14.2  INR 1.11   ABG No results found for this basename: PHART, PCO2, PO2, HCO3,  in the last 72 hours  Studies/Results: Chest 2 View  12/25/2012  *RADIOLOGY REPORT*  Clinical Data: Preop for hernia repair, some shortness of  CHEST - 2 VIEW  Comparison: Portable chest x-ray of 04/21/2012  Findings: No active infiltrate or effusion is seen.  Bullous changes are noted in both upper lobes to the apices. There is a nodular opacities seen only on the lateral view overlying the thoracic spine.  Generally this is due to  degenerative change, but on the CT from 2013 no spurring is seen at that site.  Therefore attention to this area on follow-up chest x-ray is recommended to exclude a lung lesion.  The heart is mildly enlarged.  Mediastinal contours appear stable.  No acute bony abnormality is noted.  IMPRESSION:  1.  Bullous changes in both lung apices.  No active lung disease. Stable mild cardiomegaly. 2.  Nodular opacity on the lateral view may be due to overlapping bony and vascular structures but recommend attention to this area on follow-up chest x-ray.   Original Report Authenticated By: Dwyane Dee, M.D.     Anti-infectives: Anti-infectives   Start     Dose/Rate Route Frequency Ordered Stop   12/25/12 1400  ceFAZolin (ANCEF) IVPB 1 g/50 mL premix     1 g 100 mL/hr over 30 Minutes Intravenous Every 6 hours 12/25/12 1013 12/26/12 0305   12/25/12 0725  polymyxin B 500,000 Units, bacitracin 50,000 Units in sodium chloride irrigation 0.9 % 500 mL irrigation  Status:  Discontinued       As needed 12/25/12 0725 12/25/12 1013   12/25/12 0600  ceFAZolin (ANCEF) IVPB 2 g/50 mL premix     2 g 100 mL/hr over 30 Minutes Intravenous On call to O.R. 12/24/12 1248 12/25/12 0726      Assessment/Plan: s/p Procedure(s):  HERNIA REPAIR VENTRAL ADULT INSERTION OF MESH Continue foley due to diuresing patient and urinary output monitoring May allow sips of clear liquids.  LOS: 1 day    Marta Lamas. [name], MD, FACS [phone] [facility] Surgery 12/26/2012

## 2012-12-26 NOTE — Progress Notes (Signed)
Pt c/o SOB. No wheezing noted. RR 25. O2 99% on 2L. IS given and told to take slow deep breaths. MD on call, Lindie Spruce made aware and orders given for IV lasix. Patient informed, will give med and continue to monitor.

## 2012-12-27 ENCOUNTER — Encounter (HOSPITAL_COMMUNITY): Payer: Self-pay | Admitting: General Surgery

## 2012-12-27 LAB — CBC WITH DIFFERENTIAL/PLATELET
Basophils Relative: 0 % (ref 0–1)
Eosinophils Relative: 0 % (ref 0–5)
HCT: 28.1 % — ABNORMAL LOW (ref 39.0–52.0)
Hemoglobin: 9.7 g/dL — ABNORMAL LOW (ref 13.0–17.0)
MCHC: 34.5 g/dL (ref 30.0–36.0)
MCV: 80.3 fL (ref 78.0–100.0)
Monocytes Absolute: 2.1 10*3/uL — ABNORMAL HIGH (ref 0.1–1.0)
Monocytes Relative: 13 % — ABNORMAL HIGH (ref 3–12)
Neutro Abs: 12.5 10*3/uL — ABNORMAL HIGH (ref 1.7–7.7)
RDW: 15.1 % (ref 11.5–15.5)

## 2012-12-27 MED ORDER — HYDROMORPHONE HCL PF 1 MG/ML IJ SOLN
0.5000 mg | INTRAMUSCULAR | Status: DC | PRN
  Administered 2012-12-27 (×2): 0.5 mg via INTRAVENOUS
  Administered 2012-12-27 – 2012-12-30 (×11): 1 mg via INTRAVENOUS
  Filled 2012-12-27 (×13): qty 1

## 2012-12-27 NOTE — Progress Notes (Signed)
Physical Therapy Evaluation Patient Details Name: Michael Ellison MRN: [id number] DOB: March 29, 1946 Today's Date: 12/27/2012 Time: 4098-1191 PT Time Calculation (min): 30 min  PT Assessment / Plan / Recommendation Clinical Impression  Pt is 67 yo male with ventral hernia repair who is mobilizing well despite abdominal discomfort. He will benefit from acute PT to increase mobility and independence to return home. Expect that pt will be able to d/c home or to his mother's house again with HHPT. PT will follow.    PT Assessment  Patient needs continued PT services    Follow Up Recommendations  Home health PT;Supervision - Intermittent    Does the patient have the potential to tolerate intense rehabilitation      Barriers to Discharge None      Equipment Recommendations  Rolling walker with 5" wheels    Recommendations for Other Services     Frequency Min 3X/week    Precautions / Restrictions Precautions Precautions: Other (comment) Precaution Comments: JP drains, abdominal binder Restrictions Weight Bearing Restrictions: No   Pertinent Vitals/Pain Discomfort with mvmt but no pain after session      Mobility  Bed Mobility Bed Mobility: Rolling Right;Right Sidelying to Sit Rolling Right: 4: Min assist;With rail Right Sidelying to Sit: 4: Min assist;HOB elevated Details for Bed Mobility Assistance: pt needed assist getting to side of bed, mostly due to pain. Transfers Transfers: Sit to Stand;Stand to Sit Sit to Stand: 4: Min assist;From bed;With upper extremity assist Stand to Sit: 4: Min assist;To chair/3-in-1;With upper extremity assist Details for Transfer Assistance: vc's for hand placement, min A for safety Ambulation/Gait Ambulation/Gait Assistance: 4: Min assist Ambulation Distance (Feet): 100 Feet Assistive device: Rolling walker Ambulation/Gait Assistance Details: cautious gait but pt felt better as he kept walking. RW helped control pain Gait Pattern:  Step-through pattern;Decreased stride length;Trunk flexed Gait velocity: decreased Stairs: No Wheelchair Mobility Wheelchair Mobility: No    Exercises     PT Diagnosis: Difficulty walking;Acute pain;Generalized weakness  PT Problem List: Decreased strength;Decreased activity tolerance;Decreased knowledge of use of DME;Pain;Decreased knowledge of precautions;Decreased mobility PT Treatment Interventions: DME instruction;Gait training;Functional mobility training;Therapeutic activities;Patient/family education;Therapeutic exercise;Balance training   PT Goals Acute Rehab PT Goals PT Goal Formulation: With patient Time For Goal Achievement: 01/10/13 Potential to Achieve Goals: Good Pt will go Supine/Side to Sit: with modified independence PT Goal: Supine/Side to Sit - Progress: Goal set today Pt will go Sit to Supine/Side: with modified independence PT Goal: Sit to Supine/Side - Progress: Goal set today Pt will go Sit to Stand: with modified independence PT Goal: Sit to Stand - Progress: Goal set today Pt will go Stand to Sit: with modified independence PT Goal: Stand to Sit - Progress: Goal set today Pt will Ambulate: >150 feet;with modified independence;with least restrictive assistive device PT Goal: Ambulate - Progress: Goal set today  Visit Information  Last PT Received On: 12/27/12 Assistance Needed: +1    Subjective Data  Subjective: pt felt much better after getting up and walking Patient Stated Goal: return home   Prior Functioning  Home Living Lives With: Alone Available Help at Discharge: Family;Available 24 hours/day Type of Home: House Home Access: Level entry Home Layout: Laundry or work area in basement;Able to live on main level with bedroom/bathroom Home Adaptive Equipment: None Additional Comments: after last surgery, pt went to rehab then to mother's home for a few weeks. He could d/c to mother's home again and have supervision but not physical help. Family  very supportive. Prior Function  Level of Independence: Independent Able to Take Stairs?: Yes Driving: No Vocation: Retired Comments: pt very active in past, walked several miles a day Communication Communication: No difficulties    Cognition  Cognition Overall Cognitive Status: Appears within functional limits for tasks assessed/performed Arousal/Alertness: Awake/alert Orientation Level: Appears intact for tasks assessed Behavior During Session: [facility] for tasks performed    Extremity/Trunk Assessment Right Upper Extremity Assessment RUE ROM/Strength/Tone: [facility] for tasks assessed Left Upper Extremity Assessment LUE ROM/Strength/Tone: WFL for tasks assessed Right Lower Extremity Assessment RLE ROM/Strength/Tone: Deficits;Due to pain RLE ROM/Strength/Tone Deficits: abdominal discomfort with leg mvmts, pt demonstrated at least 3/5 strength throughout bilateral LE's, generalized weakness noted with activity. Pt had tremors at rest today, improved with mvmt RLE Sensation: WFL - Light Touch RLE Coordination: WFL - gross motor Left Lower Extremity Assessment LLE ROM/Strength/Tone: Deficits;Due to pain LLE ROM/Strength/Tone Deficits: see RLE note LLE Sensation: WFL - Light Touch LLE Coordination: WFL - gross motor Trunk Assessment Trunk Assessment: Kyphotic (due to abdominal discomfort)   Balance Balance Balance Assessed: Yes Static Standing Balance Static Standing - Balance Support: No upper extremity supported;During functional activity Static Standing - Level of Assistance: 4: Min assist  End of Session PT - End of Session Equipment Utilized During Treatment: Gait belt Activity Tolerance: Patient tolerated treatment well Patient left: in chair;with call bell/phone within reach;with family/visitor present Nurse Communication: Mobility status  GP   Lyanne Co, PT  Acute Rehab Services  [phone]   [employer], [place] 12/27/2012, 1:21 PM

## 2012-12-27 NOTE — Progress Notes (Addendum)
GS Progress Note Subjective: Patient a little frightened this morning. Hemoglobin has dropped this morning.  He is a bit anxious.  Objective: Vital signs in last 24 hours: Temp:  [97.9 F (36.6 C)-98.9 F (37.2 C)] 98 F (36.7 C) (03/12 0655) Pulse Rate:  [99-116] 104 (03/12 0655) Resp:  [10-28] 20 (03/12 0843) BP: (107-141)/(73-91) 133/74 mmHg (03/12 0655) SpO2:  [90 %-100 %] 90 % (03/12 0843) Last BM Date: 12/24/12  Intake/Output from previous day: 03/11 0701 - 03/12 0700 In: 2185 [P.O.:30; I.V.:2155] Out: 1580 [Urine:1275; Drains:305] Intake/Output this shift:    Lungs: Clear.  Pulling 1250 on incentive spirometer this AM. Oxygen saturations between 89% and 94%  Abd: distended, good bowel sounds, drain output is bloody.  Tender but without peritonitis.  Excellent bowel sounds. Slightly more protuberant on the right side.  Extremities: No DVT signs or symptoms.  Neuro: Intact, anxious  Lab Results: CBC   Recent Labs  12/26/12 0600 12/27/12 0659  WBC 15.1* 16.5*  HGB 11.5* 9.7*  HCT 34.4* 28.1*  PLT 238 212   BMET  Recent Labs  12/26/12 0600 12/26/12 0900  NA 137 137  K 6.0* 5.1  CL 105 105  CO2 24 24  GLUCOSE 149* 141*  BUN 34* 34*  CREATININE 2.03* 1.90*  CALCIUM 7.7* 7.9*   PT/INR  Recent Labs  12/25/12 0625  LABPROT 14.2  INR 1.11   ABG No results found for this basename: PHART, PCO2, PO2, HCO3,  in the last 72 hours  Studies/Results: No results found.  Anti-infectives: Anti-infectives   Start     Dose/Rate Route Frequency Ordered Stop   12/25/12 1400  ceFAZolin (ANCEF) IVPB 1 g/50 mL premix     1 g 100 mL/hr over 30 Minutes Intravenous Every 6 hours 12/25/12 1013 12/26/12 0305   12/25/12 0725  polymyxin B 500,000 Units, bacitracin 50,000 Units in sodium chloride irrigation 0.9 % 500 mL irrigation  Status:  Discontinued       As needed 12/25/12 0725 12/25/12 1013   12/25/12 0600  ceFAZolin (ANCEF) IVPB 2 g/50 mL premix     2 g 100  mL/hr over 30 Minutes Intravenous On call to O.R. 12/24/12 1248 12/25/12 0726      Assessment/Plan: s/p Procedure(s): HERNIA REPAIR VENTRAL ADULT INSERTION OF MESH d/c foley Advance diet Check labs again in the AM. Continue therapy and telemetry  LOS: 2 days    Marta Lamas. [name], MD, FACS [phone] [facility] Surgery 12/27/2012

## 2012-12-28 LAB — CBC WITH DIFFERENTIAL/PLATELET
Basophils Absolute: 0 10*3/uL (ref 0.0–0.1)
Basophils Relative: 0 % (ref 0–1)
Eosinophils Absolute: 0 10*3/uL (ref 0.0–0.7)
HCT: 24.4 % — ABNORMAL LOW (ref 39.0–52.0)
Hemoglobin: 8.3 g/dL — ABNORMAL LOW (ref 13.0–17.0)
MCH: 27.7 pg (ref 26.0–34.0)
MCHC: 34 g/dL (ref 30.0–36.0)
Monocytes Absolute: 1.4 10*3/uL — ABNORMAL HIGH (ref 0.1–1.0)
Monocytes Relative: 10 % (ref 3–12)
Neutrophils Relative %: 74 % (ref 43–77)
RDW: 15.1 % (ref 11.5–15.5)

## 2012-12-28 LAB — BASIC METABOLIC PANEL
BUN: 20 mg/dL (ref 6–23)
Creatinine, Ser: 1.02 mg/dL (ref 0.50–1.35)
GFR calc Af Amer: 87 mL/min — ABNORMAL LOW (ref >90)
GFR calc non Af Amer: 75 mL/min — ABNORMAL LOW (ref >90)

## 2012-12-28 MED ORDER — FUROSEMIDE 10 MG/ML IJ SOLN
20.0000 mg | Freq: Once | INTRAMUSCULAR | Status: AC
  Administered 2012-12-28: 20 mg via INTRAVENOUS
  Filled 2012-12-28 (×2): qty 2

## 2012-12-28 NOTE — Progress Notes (Signed)
GS Progress Note Subjective: Patient is a little bit better today.  Mobilizing better.  Voiding better.  Less pain.  Small bowel movement yesterday, none today.  Minimal flatus.  Objective: Vital signs in last 24 hours: Temp:  [98.1 F (36.7 C)-98.2 F (36.8 C)] 98.1 F (36.7 C) (03/13 0515) Pulse Rate:  [97-109] 97 (03/13 0515) Resp:  [16-20] 16 (03/13 0515) BP: (122-130)/(62-103) 122/81 mmHg (03/13 0515) SpO2:  [90 %-99 %] 98 % (03/13 0515) Last BM Date: 12/24/12  Intake/Output from previous day: 03/12 0701 - 03/13 0700 In: 2604.3 [P.O.:240; I.V.:2364.3] Out: 1045 [Urine:850; Drains:195] Intake/Output this shift: Total I/O In: -  Out: 260 [Urine:250; Drains:10]  Lungs: Clear to auscultation.  Oxygen saturations better.  Abd: Distended.  Hypoactive bowel sounds.  Still more protuberant on the right side in the lower abdomen.  Drain output has significantly decreased.  Extremities: No DVT signs or symptoms  Neuro: Intact   Lab Results: CBC   Recent Labs  12/27/12 0659 12/28/12 0645  WBC 16.5* 13.3*  HGB 9.7* 8.3*  HCT 28.1* 24.4*  PLT 212 236   BMET  Recent Labs  12/26/12 0600 12/26/12 0900  NA 137 137  K 6.0* 5.1  CL 105 105  CO2 24 24  GLUCOSE 149* 141*  BUN 34* 34*  CREATININE 2.03* 1.90*  CALCIUM 7.7* 7.9*   PT/INR No results found for this basename: LABPROT, INR,  in the last 72 hours ABG No results found for this basename: PHART, PCO2, PO2, HCO3,  in the last 72 hours  Studies/Results: No results found.  Anti-infectives: Anti-infectives   Start     Dose/Rate Route Frequency Ordered Stop   12/25/12 1400  ceFAZolin (ANCEF) IVPB 1 g/50 mL premix     1 g 100 mL/hr over 30 Minutes Intravenous Every 6 hours 12/25/12 1013 12/26/12 0305   12/25/12 0725  polymyxin B 500,000 Units, bacitracin 50,000 Units in sodium chloride irrigation 0.9 % 500 mL irrigation  Status:  Discontinued       As needed 12/25/12 0725 12/25/12 1013   12/25/12 0600   ceFAZolin (ANCEF) IVPB 2 g/50 mL premix     2 g 100 mL/hr over 30 Minutes Intravenous On call to O.R. 12/24/12 1248 12/25/12 0726      Assessment/Plan: s/p Procedure(s): HERNIA REPAIR VENTRAL ADULT INSERTION OF MESH Advance diet Transfuse one unit of PRBC and give Lasix. Advance to full liquid diet. Decrease IVF rate.  LOS: 3 days    Marta Lamas. [name], MD, FACS [phone] [facility] 12/28/2012

## 2012-12-28 NOTE — Progress Notes (Signed)
Physical Therapy Treatment Patient Details Name: Michael Ellison MRN: [id number] DOB: [birth year] Today's Date: 12/28/2012 Time: 7829-5621 PT Time Calculation (min): 25 min  PT Assessment / Plan / Recommendation Comments on Treatment Session  Pt pleasent despite increased pain today. Patient able to ambulate in hall but required increased time and occassional breaks secondary to increased pain. Pt encouraged to increase activity/mobilization throughout the day.     Follow Up Recommendations  Home health PT;Supervision - Intermittent           Equipment Recommendations  Rolling walker with 5" wheels       Frequency Min 3X/week   Plan Discharge plan remains appropriate    Precautions / Restrictions Precautions Precautions: Other (comment) Precaution Comments: JP drains, abdominal binder Restrictions Weight Bearing Restrictions: No   Pertinent Vitals/Pain 6/10 initially; 8/10 with activity (nsg to give pain meds)    Mobility  Bed Mobility Bed Mobility: Rolling Right;Right Sidelying to Sit Rolling Right: 5: Supervision Right Sidelying to Sit: 5: Supervision Details for Bed Mobility Assistance: HOB elevated; required increased time secondary to pain Transfers Transfers: Sit to Stand;Stand to Sit Sit to Stand: 4: Min assist;With upper extremity assist;From bed Stand to Sit: 4: Min assist;To chair/3-in-1;With upper extremity assist Details for Transfer Assistance: vc's for hand placement, min A for safety Ambulation/Gait Ambulation/Gait Assistance: 4: Min guard Ambulation Distance (Feet): 100 Feet Assistive device: Rolling walker Ambulation/Gait Assistance Details: Patient appears limited by pain today; ambulated with 02 2 liters, very rigid gait with VC's for upright posture Gait Pattern: Step-through pattern;Decreased stride length;Trunk flexed Gait velocity: decreased General Gait Details: overall steady Stairs: No Wheelchair Mobility Wheelchair Mobility: No       PT Goals Acute Rehab PT Goals PT Goal Formulation: With patient Time For Goal Achievement: 01/10/13 Potential to Achieve Goals: Good Pt will go Supine/Side to Sit: with modified independence PT Goal: Supine/Side to Sit - Progress: Progressing toward goal Pt will go Sit to Supine/Side: with modified independence Pt will go Sit to Stand: with modified independence PT Goal: Sit to Stand - Progress: Progressing toward goal Pt will go Stand to Sit: with modified independence PT Goal: Stand to Sit - Progress: Progressing toward goal Pt will Ambulate: >150 feet;with modified independence;with least restrictive assistive device PT Goal: Ambulate - Progress: Progressing toward goal  Visit Information  Last PT Received On: 12/28/12 Assistance Needed: +1    Subjective Data  Subjective: The pain is a little worse today Patient Stated Goal: return home   Cognition  Cognition Overall Cognitive Status: Appears within functional limits for tasks assessed/performed Arousal/Alertness: Awake/alert Orientation Level: Appears intact for tasks assessed Behavior During Session: [facility] for tasks performed    Balance  Balance Balance Assessed: Yes Static Standing Balance Static Standing - Balance Support: No upper extremity supported;During functional activity Static Standing - Level of Assistance: 5: Stand by assistance  End of Session PT - End of Session Equipment Utilized During Treatment: Gait belt Activity Tolerance: Patient tolerated treatment well Patient left: in chair;with call bell/phone within reach Nurse Communication: Mobility status;Patient requests pain meds   GP     Fabio Asa 12/28/2012, 10:55 AM

## 2012-12-29 LAB — CBC WITH DIFFERENTIAL/PLATELET
Basophils Absolute: 0 10*3/uL (ref 0.0–0.1)
Eosinophils Absolute: 0 10*3/uL (ref 0.0–0.7)
Eosinophils Relative: 0 % (ref 0–5)
HCT: 26.3 % — ABNORMAL LOW (ref 39.0–52.0)
Lymphocytes Relative: 20 % (ref 12–46)
MCH: 27.4 pg (ref 26.0–34.0)
MCV: 83 fL (ref 78.0–100.0)
Monocytes Absolute: 1.1 10*3/uL — ABNORMAL HIGH (ref 0.1–1.0)
RDW: 14.8 % (ref 11.5–15.5)
WBC: 11.5 10*3/uL — ABNORMAL HIGH (ref 4.0–10.5)

## 2012-12-29 LAB — BASIC METABOLIC PANEL
CO2: 26 mEq/L (ref 19–32)
Calcium: 8.5 mg/dL (ref 8.4–10.5)
Creatinine, Ser: 0.93 mg/dL (ref 0.50–1.35)
GFR calc non Af Amer: 86 mL/min — ABNORMAL LOW (ref >90)
Glucose, Bld: 109 mg/dL — ABNORMAL HIGH (ref 70–99)

## 2012-12-29 LAB — TYPE AND SCREEN
ABO/RH(D): A POS
Antibody Screen: NEGATIVE
Unit division: 0

## 2012-12-29 MED ORDER — ALBUTEROL SULFATE (5 MG/ML) 0.5% IN NEBU: 2.5000 mg | INHALATION_SOLUTION | RESPIRATORY_TRACT | Status: DC | PRN

## 2012-12-29 MED ORDER — ALBUTEROL SULFATE (5 MG/ML) 0.5% IN NEBU
2.5000 mg | INHALATION_SOLUTION | Freq: Two times a day (BID) | RESPIRATORY_TRACT | Status: DC
  Administered 2012-12-30 – 2012-12-31 (×3): 2.5 mg via RESPIRATORY_TRACT
  Filled 2012-12-29 (×3): qty 0.5

## 2012-12-29 NOTE — Progress Notes (Signed)
GS Progress Note Subjective: Patient sitting up in chair and feeling much better..  Much less pain.  Objective: Vital signs in last 24 hours: Temp:  [97.6 F (36.4 C)-98.5 F (36.9 C)] 97.6 F (36.4 C) (03/14 0529) Pulse Rate:  [92-111] 92 (03/14 0529) Resp:  [18-20] 18 (03/14 0529) BP: (126-140)/(76-91) 137/86 mmHg (03/14 0529) SpO2:  [96 %-99 %] 96 % (03/14 0529) Last BM Date: 12/24/12  Intake/Output from previous day: 03/13 0701 - 03/14 0700 In: 2159.2 [I.V.:1719.2; Blood:350] Out: 780 [Urine:700; Drains:80] Intake/Output this shift: Total I/O In: 360 [P.O.:360] Out: -   Lungs: Clear.  Oxygen saturations good.  Abd: softer, but distended.  Good bowel sounds.  Passing gas  Extremities: No changes  Neuro: Intact  Lab Results: CBC   Recent Labs  12/28/12 0645 12/29/12 0515  WBC 13.3* 11.5*  HGB 8.3* 8.7*  HCT 24.4* 26.3*  PLT 236 241   BMET  Recent Labs  12/28/12 0645 12/29/12 0515  NA 140 141  K 4.0 4.0  CL 106 105  CO2 26 26  GLUCOSE 114* 109*  BUN 20 19  CREATININE 1.02 0.93  CALCIUM 8.0* 8.5   PT/INR No results found for this basename: LABPROT, INR,  in the last 72 hours ABG No results found for this basename: PHART, PCO2, PO2, HCO3,  in the last 72 hours  Studies/Results: No results found.  Anti-infectives: Anti-infectives   Start     Dose/Rate Route Frequency Ordered Stop   12/25/12 1400  ceFAZolin (ANCEF) IVPB 1 g/50 mL premix     1 g 100 mL/hr over 30 Minutes Intravenous Every 6 hours 12/25/12 1013 12/26/12 0305   12/25/12 0725  polymyxin B 500,000 Units, bacitracin 50,000 Units in sodium chloride irrigation 0.9 % 500 mL irrigation  Status:  Discontinued       As needed 12/25/12 0725 12/25/12 1013   12/25/12 0600  ceFAZolin (ANCEF) IVPB 2 g/50 mL premix     2 g 100 mL/hr over 30 Minutes Intravenous On call to O.R. 12/24/12 1248 12/25/12 0726      Assessment/Plan: s/p Procedure(s): HERNIA REPAIR VENTRAL ADULT INSERTION OF  MESH Advance diet Plan for discharge tomorrow Saline lock IV.  LOS: 4 days    Marta Lamas. [name], MD, FACS [phone] [facility] Surgery 12/29/2012

## 2012-12-29 NOTE — Progress Notes (Signed)
Physical Therapy Treatment Patient Details Name: Michael Ellison MRN: [id number] DOB: 13-Dec-1945 Today's Date: 12/29/2012 Time: 4098-1191 PT Time Calculation (min): 26 min  PT Assessment / Plan / Recommendation Comments on Treatment Session  Pt made excellent progress today with increased tolerance for mobility. Pt is close to Mod. Independent level with RW. Will continue to progress mobility and independence.    Follow Up Recommendations  Home health PT     Does the patient have the potential to tolerate intense rehabilitation     Barriers to Discharge        Equipment Recommendations  Rolling walker with 5" wheels    Recommendations for Other Services    Frequency     Plan Discharge plan remains appropriate;Frequency remains appropriate    Precautions / Restrictions     Pertinent Vitals/Pain     Mobility  Bed Mobility Bed Mobility: Not assessed Transfers Transfers: Sit to Stand Stand to Sit: 5: Supervision Ambulation/Gait Ambulation/Gait Assistance: 5: Supervision Ambulation Distance (Feet): 300 Feet Assistive device: Rolling walker Ambulation/Gait Assistance Details: cues for more upright posture and decreased reliance on the RW ambulated with 3l o2 Gait Pattern: Step-through pattern;Decreased stride length    Exercises     PT Diagnosis:    PT Problem List:   PT Treatment Interventions:     PT Goals Acute Rehab PT Goals PT Goal: Sit to Stand - Progress: Progressing toward goal PT Goal: Stand to Sit - Progress: Progressing toward goal PT Goal: Ambulate - Progress: Progressing toward goal  Visit Information  Last PT Received On: 12/29/12 Assistance Needed: +1    Subjective Data  Subjective: I feel good  today.   Cognition  Cognition Overall Cognitive Status: Appears within functional limits for tasks assessed/performed Arousal/Alertness: Awake/alert Orientation Level: Appears intact for tasks assessed Behavior During Session: [facility] for tasks performed     Balance  Static Standing Balance Static Standing - Balance Support: No upper extremity supported Static Standing - Level of Assistance: 5: Stand by assistance  End of Session PT - End of Session Equipment Utilized During Treatment: Gait belt Activity Tolerance: Patient tolerated treatment well Patient left: in chair;with call bell/phone within reach Nurse Communication: Mobility status   GP     Greggory Stallion 12/29/2012, 11:44 AM

## 2012-12-30 LAB — CBC WITH DIFFERENTIAL/PLATELET
Basophils Absolute: 0.1 10*3/uL (ref 0.0–0.1)
Eosinophils Absolute: 0.2 10*3/uL (ref 0.0–0.7)
Eosinophils Relative: 2 % (ref 0–5)
MCH: 27.6 pg (ref 26.0–34.0)
MCV: 82.9 fL (ref 78.0–100.0)
Platelets: 265 10*3/uL (ref 150–400)
RDW: 15.1 % (ref 11.5–15.5)
WBC: 11.4 10*3/uL — ABNORMAL HIGH (ref 4.0–10.5)

## 2012-12-30 LAB — BASIC METABOLIC PANEL
Calcium: 8.9 mg/dL (ref 8.4–10.5)
GFR calc Af Amer: >90 mL/min (ref >90)
GFR calc non Af Amer: >90 mL/min (ref >90)
Sodium: 138 mEq/L (ref 135–145)

## 2012-12-30 NOTE — Progress Notes (Signed)
GS Progress Note Subjective: Patient sitting up at the bedside, says he is sore, but feels fine.  Bloody output from both Blake drains, different than yesterday.  Patient is on Lovenox.  Objective: Vital signs in last 24 hours: Temp:  [97.7 F (36.5 C)-98 F (36.7 C)] 97.7 F (36.5 C) (03/15 0500) Pulse Rate:  [95-110] 95 (03/15 0500) Resp:  [18] 18 (03/15 0500) BP: (107-147)/(71-112) 107/74 mmHg (03/15 0500) SpO2:  [98 %-100 %] 98 % (03/15 0500) Last BM Date:  (preop)  Intake/Output from previous day: 03/14 0701 - 03/15 0700 In: 495 [P.O.:460] Out: 845 [Urine:700; Drains:145] Intake/Output this shift:    Lungs: Clear to auscultation  Abd: Soft, non-tender, good bowel sounds  Extremities: No DVT signs or symptoms  Neuro: Intact  Lab Results: CBC   Recent Labs  12/28/12 0645 12/29/12 0515  WBC 13.3* 11.5*  HGB 8.3* 8.7*  HCT 24.4* 26.3*  PLT 236 241   BMET  Recent Labs  12/28/12 0645 12/29/12 0515  NA 140 141  K 4.0 4.0  CL 106 105  CO2 26 26  GLUCOSE 114* 109*  BUN 20 19  CREATININE 1.02 0.93  CALCIUM 8.0* 8.5   PT/INR No results found for this basename: LABPROT, INR,  in the last 72 hours ABG No results found for this basename: PHART, PCO2, PO2, HCO3,  in the last 72 hours  Studies/Results: No results found.  Anti-infectives: Anti-infectives   Start     Dose/Rate Route Frequency Ordered Stop   12/25/12 1400  ceFAZolin (ANCEF) IVPB 1 g/50 mL premix     1 g 100 mL/hr over 30 Minutes Intravenous Every 6 hours 12/25/12 1013 12/26/12 0305   12/25/12 0725  polymyxin B 500,000 Units, bacitracin 50,000 Units in sodium chloride irrigation 0.9 % 500 mL irrigation  Status:  Discontinued       As needed 12/25/12 0725 12/25/12 1013   12/25/12 0600  ceFAZolin (ANCEF) IVPB 2 g/50 mL premix     2 g 100 mL/hr over 30 Minutes Intravenous On call to O.R. 12/24/12 1248 12/25/12 0726      Assessment/Plan: s/p Procedure(s): HERNIA REPAIR VENTRAL  ADULT INSERTION OF MESH Stop LovenoxCheck stat CBC Keep until stable. May need more blood. Hemodynamically the patient is stable.  LOS: 5 days    Marta Lamas. [name], MD, FACS [phone] [facility] Surgery 12/30/2012

## 2012-12-31 MED ORDER — HYDROCODONE-ACETAMINOPHEN 5-500 MG PO TABS: 25.0000 | ORAL_TABLET | ORAL | Status: DC | PRN

## 2012-12-31 NOTE — Discharge Summary (Signed)
Physician Discharge Summary  Patient ID: Michael Ellison MRN: [id number] DOB/AGE: [birth year] 67 y.o.  Admit date: 12/25/2012 Discharge date: 12/31/2012  Admission Diagnoses:  Discharge Diagnoses:  Active Problems:   * No active hospital problems. *   Discharged Condition: good  Hospital Course: Patient admitted immediately after complex ventral hernia repair with component separation and use of biological prosthetic. Postoperatively had problems with bleeding and he received one unit of PRBCs.  He is afebrile.  Eating well currently.  Has binder in place and two Blake drains.  Consults: None  Significant Diagnostic Studies: labs: cbcs and radiology: CXR: normal  Treatments: IV hydration, antibiotics: Ancef, anticoagulation: LMW heparin and ventral hernia repair.  Discharge Exam: Blood pressure 119/79, pulse 82, temperature 98.2 F (36.8 C), temperature source Oral, resp. rate 18, height 6\' 3"  (1.905 m), weight 92.08 kg (203 lb), SpO2 94.00%. General appearance: alert, cooperative and no distress GI: soft, non-tender; bowel sounds normal; no masses,  no organomegaly and Drains are draining old blood Incision/Wound: Clean and dry without infection.  Disposition: Home with family assistance      Discharge Orders   Future Appointments Provider Department Dept Phone   01/09/2013 8:15 AM Cherylynn Ridges, MD [facility] Surgery, [place] [phone]   Future Orders Complete By Expires     Call MD for:  difficulty breathing, headache or visual disturbances  As directed     Call MD for:  extreme fatigue  As directed     Call MD for:  hives  As directed     Call MD for:  persistant dizziness or light-headedness  As directed     Call MD for:  persistant nausea and vomiting  As directed     Call MD for:  redness, tenderness, or signs of infection (pain, swelling, redness, odor or green/yellow discharge around incision site)  As directed     Call MD for:  severe uncontrolled pain  As  directed     Call MD for:  temperature >100.4  As directed     Change dressing (specify)  As directed     Comments:      Dressing change: one times per day using 4x4 gauze.  May shower and pat dry.  Keep drain sites clean and covered..    Diet - low sodium heart healthy  As directed     Discharge instructions  As directed     Comments:      See included instruction for hernia repair patients    Driving Restrictions  As directed     Comments:      No driving for two weeks    Increase activity slowly  As directed     Lifting restrictions  As directed     Comments:      No lifting >20lb for 6 weeks.        Medication List    STOP taking these medications       ibuprofen 200 MG tablet  Commonly known as:  ADVIL,MOTRIN      TAKE these medications       albuterol (5 MG/ML) 0.5% nebulizer solution  Commonly known as:  PROVENTIL  Take 2.5 mg by nebulization every 6 (six) hours. AND Q3 PRN SOB or Wheezing     amLODipine 5 MG tablet  Commonly known as:  NORVASC  Take 5 mg by mouth daily.     budesonide 0.5 MG/2ML nebulizer solution  Commonly known as:  PULMICORT  Take 0.5 mg by  nebulization 2 (two) times daily.     budesonide-formoterol 160-4.5 MCG/ACT inhaler  Commonly known as:  SYMBICORT  Inhale 2 puffs into the lungs 2 (two) times daily.     escitalopram 10 MG tablet  Commonly known as:  LEXAPRO  Take 10 mg by mouth daily.     furosemide 40 MG tablet  Commonly known as:  LASIX  Take 40 mg by mouth daily.     HYDROcodone-acetaminophen 5-500 MG per tablet  Commonly known as:  VICODIN  Take 25 tablets by mouth as needed.     HYDROcodone-acetaminophen 5-500 MG per tablet  Commonly known as:  VICODIN  Take 1 tablet by mouth every 6 (six) hours as needed for pain.       Follow-up Information   Follow up with Angeleah Labrake, Marta Lamas, MD. Call on 01/03/2013. (Call Marcelino Duster at office to make appointment to see me on Wednesday.)    Contact information:   [address], [address]  [place], [place] [address] [phone]       Signed: Cherylynn Ridges 12/31/2012, 1:04 PM

## 2012-12-31 NOTE — Progress Notes (Signed)
Discharge instructions gone over with patient. Home medications gone over with patient. Drain care, incisional care, diet , activity, and signs/symptoms of infection and or worsening condition discussed with patient. Follow appointment to be made with surgeon. Patient told to stop taking ibuprofen.  Discussed my chart with patient. Patient return demonstrated how to empty, charge, and  record jackson pratt drainage. Patient verbalized understanding of instructions.

## 2013-01-01 ENCOUNTER — Telehealth (INDEPENDENT_AMBULATORY_CARE_PROVIDER_SITE_OTHER): Payer: Self-pay | Admitting: *Deleted

## 2013-01-01 NOTE — Telephone Encounter (Signed)
Daughter called back to update that patient did have a small bowel movement today and will continue on the Milk of Mag until he is able to release more.  Daughter states that patient has 2 drains: one is draining less than 10cc per day however the other drain is draining 70cc per 24hours.  Nurse only appt made for Wednesday to pull at least the one drain.  Daughter states understanding to track the other drain output.

## 2013-01-01 NOTE — Telephone Encounter (Signed)
Daughter called to state that patient has not had a bowel movement since surgery.  Instructed daughter to have patient states on a stool softener and Milk of Mag today to try to induce a bowel movement.  Daughter also called about pulling JP drains but was unsure how much they were still draining so she is going to check on the amount and call back to schedule nurse only to pull drains.  Daughter states understanding and agreeable of all instructions.  She will call back this afternoon to update [place] on bowel movement and with drainage amount.

## 2013-01-03 ENCOUNTER — Encounter (INDEPENDENT_AMBULATORY_CARE_PROVIDER_SITE_OTHER): Payer: Medicare Other

## 2013-01-03 ENCOUNTER — Ambulatory Visit (INDEPENDENT_AMBULATORY_CARE_PROVIDER_SITE_OTHER): Payer: Medicare Other | Admitting: General Surgery

## 2013-01-03 ENCOUNTER — Encounter (INDEPENDENT_AMBULATORY_CARE_PROVIDER_SITE_OTHER): Payer: Self-pay | Admitting: General Surgery

## 2013-01-03 VITALS — BP 160/80 | HR 92 | Temp 97.6°F | Resp 20 | Ht 75.0 in | Wt 199.0 lb

## 2013-01-03 LAB — CBC
Platelets: 417 10*3/uL — ABNORMAL HIGH (ref 150–400)
RBC: 3.86 MIL/uL — ABNORMAL LOW (ref 4.22–5.81)
WBC: 13.1 10*3/uL — ABNORMAL HIGH (ref 4.0–10.5)

## 2013-01-03 NOTE — Progress Notes (Signed)
The patient comes in today still somewhat debilitated from his ventral hernia repair. He brought in a list of the amount of output from each drain. The one on the right drain the least amount. I removed the drain on the right the right lower quadrant today. The midline wound is well-healed and has no drainage. I instructed the nurse to remove staples and apply Steri-Strips with benzoin.  A we keep the left lower quadrant drain in place until I see the patient back in one week. He as been complaining that his abdominal binder is too tight. His abdomen does not appear to be more distended to me however I will give him a larger binder to take home. I will see him back in the office on the 25th. He also appears to be somewhat winded. The patient had a fair amount of blood in his drains from before. His hemoglobin may be low and therefore we may need to start him on some iron supplementation. I will send him for a CBC today.

## 2013-01-08 ENCOUNTER — Telehealth (INDEPENDENT_AMBULATORY_CARE_PROVIDER_SITE_OTHER): Payer: Self-pay

## 2013-01-08 NOTE — Telephone Encounter (Signed)
Patients daughter called to say Michael Ellison has been complaining of pain at drain site and mother states it is puffy and had dried yellow crust around drain site. She states he does not have a ride to come in office today for evaluation. I let Dr Lindie Spruce Know and it is ok to wait to be seen at his scheduled appointment tomorrow.

## 2013-01-09 ENCOUNTER — Encounter (INDEPENDENT_AMBULATORY_CARE_PROVIDER_SITE_OTHER): Payer: Self-pay | Admitting: General Surgery

## 2013-01-09 ENCOUNTER — Ambulatory Visit (INDEPENDENT_AMBULATORY_CARE_PROVIDER_SITE_OTHER): Payer: Medicare Other | Admitting: General Surgery

## 2013-01-09 ENCOUNTER — Telehealth (INDEPENDENT_AMBULATORY_CARE_PROVIDER_SITE_OTHER): Payer: Self-pay | Admitting: General Surgery

## 2013-01-09 ENCOUNTER — Encounter (INDEPENDENT_AMBULATORY_CARE_PROVIDER_SITE_OTHER): Payer: Medicare Other | Admitting: General Surgery

## 2013-01-09 VITALS — BP 126/84 | HR 84 | Temp 98.0°F | Resp 18 | Ht 75.0 in | Wt 194.0 lb

## 2013-01-09 DIAGNOSIS — Z09 Encounter for follow-up examination after completed treatment for conditions other than malignant neoplasm: Secondary | ICD-10-CM

## 2013-01-09 NOTE — Progress Notes (Signed)
The patient comes in today doing well but stating that he's had some bloody and purulent drainage from his left drain site. On examination today there was no pus.  The left lower quadrant Blake drain was removed and immediately there was a gush of old blood from the subcutaneous area. This persisted for a few minutes but appeared to be controlled with the patient in the standing position. We placed a sterile 4 x 4 gauzes and an ABG pad to control the drainage and then taped in place. We reapplied the patient's abdominal binder.  The stomach is still protuberant but not markedly distended a we see him again in approximately 3 weeks at the urging of his for followup. I've advised him that if he should have fevers or any purulent drainage then he should call me immediately. There is none indication for infection at this time.

## 2013-01-09 NOTE — Telephone Encounter (Signed)
Called patient with no answer. Called daughter, Michael Ellison, at [phone] and lmom to see if she is bringing patient to see [place] today. Patient has a drain and needs to see Dr Lindie Spruce today. No showed appt this am.

## 2013-01-15 ENCOUNTER — Telehealth (INDEPENDENT_AMBULATORY_CARE_PROVIDER_SITE_OTHER): Payer: Self-pay | Admitting: General Surgery

## 2013-01-15 ENCOUNTER — Inpatient Hospital Stay (HOSPITAL_COMMUNITY)
Admission: EM | Admit: 2013-01-15 | Discharge: 2013-01-17 | DRG: 863 | Disposition: A | Discharge status: Home / Self Care | Payer: Medicare Other | Attending: General Surgery | Admitting: General Surgery

## 2013-01-15 ENCOUNTER — Inpatient Hospital Stay (HOSPITAL_COMMUNITY): Payer: Medicare Other

## 2013-01-15 ENCOUNTER — Encounter (HOSPITAL_COMMUNITY): Payer: Self-pay | Admitting: *Deleted

## 2013-01-15 ENCOUNTER — Encounter (HOSPITAL_COMMUNITY): Admission: EM | Disposition: A | Discharge status: Home / Self Care | Payer: Self-pay | Source: Home / Self Care

## 2013-01-15 ENCOUNTER — Encounter (HOSPITAL_COMMUNITY): Payer: Self-pay | Admitting: Certified Registered"

## 2013-01-15 ENCOUNTER — Inpatient Hospital Stay (HOSPITAL_COMMUNITY): Payer: Medicare Other | Admitting: Certified Registered"

## 2013-01-15 DIAGNOSIS — L02219 Cutaneous abscess of trunk, unspecified: Secondary | ICD-10-CM

## 2013-01-15 DIAGNOSIS — L03319 Cellulitis of trunk, unspecified: Secondary | ICD-10-CM

## 2013-01-15 DIAGNOSIS — L03311 Cellulitis of abdominal wall: Secondary | ICD-10-CM | POA: Diagnosis present

## 2013-01-15 DIAGNOSIS — J4489 Other specified chronic obstructive pulmonary disease: Secondary | ICD-10-CM | POA: Diagnosis present

## 2013-01-15 DIAGNOSIS — T8140XA Infection following a procedure, unspecified, initial encounter: Principal | ICD-10-CM | POA: Diagnosis present

## 2013-01-15 DIAGNOSIS — Z87891 Personal history of nicotine dependence: Secondary | ICD-10-CM

## 2013-01-15 DIAGNOSIS — F3289 Other specified depressive episodes: Secondary | ICD-10-CM | POA: Diagnosis present

## 2013-01-15 DIAGNOSIS — Z8546 Personal history of malignant neoplasm of prostate: Secondary | ICD-10-CM

## 2013-01-15 DIAGNOSIS — Y849 Medical procedure, unspecified as the cause of abnormal reaction of the patient, or of later complication, without mention of misadventure at the time of the procedure: Secondary | ICD-10-CM | POA: Diagnosis present

## 2013-01-15 DIAGNOSIS — I10 Essential (primary) hypertension: Secondary | ICD-10-CM | POA: Diagnosis present

## 2013-01-15 DIAGNOSIS — F329 Major depressive disorder, single episode, unspecified: Secondary | ICD-10-CM | POA: Diagnosis present

## 2013-01-15 DIAGNOSIS — Z79899 Other long term (current) drug therapy: Secondary | ICD-10-CM

## 2013-01-15 DIAGNOSIS — J449 Chronic obstructive pulmonary disease, unspecified: Secondary | ICD-10-CM | POA: Diagnosis present

## 2013-01-15 DIAGNOSIS — K432 Incisional hernia without obstruction or gangrene: Secondary | ICD-10-CM | POA: Diagnosis present

## 2013-01-15 DIAGNOSIS — IMO0002 Reserved for concepts with insufficient information to code with codable children: Secondary | ICD-10-CM

## 2013-01-15 HISTORY — DX: Pneumonitis due to inhalation of food and vomit: J69.0

## 2013-01-15 HISTORY — PX: INCISION AND DRAINAGE ABSCESS: SHX5864

## 2013-01-15 LAB — BASIC METABOLIC PANEL
CO2: 22 mEq/L (ref 19–32)
Calcium: 8.9 mg/dL (ref 8.4–10.5)
Creatinine, Ser: 0.76 mg/dL (ref 0.50–1.35)
Glucose, Bld: 98 mg/dL (ref 70–99)

## 2013-01-15 LAB — CBC
HCT: 30.2 % — ABNORMAL LOW (ref 39.0–52.0)
Hemoglobin: 10.1 g/dL — ABNORMAL LOW (ref 13.0–17.0)
RDW: 15.1 % (ref 11.5–15.5)
WBC: 11.9 10*3/uL — ABNORMAL HIGH (ref 4.0–10.5)

## 2013-01-15 SURGERY — INCISION AND DRAINAGE, ABSCESS
Anesthesia: General | Site: Abdomen | Wound class: Dirty or Infected

## 2013-01-15 MED ORDER — HYDROMORPHONE HCL PF 1 MG/ML IJ SOLN
INTRAMUSCULAR | Status: AC
  Filled 2013-01-15: qty 1

## 2013-01-15 MED ORDER — LIDOCAINE HCL (CARDIAC) 20 MG/ML IV SOLN
INTRAVENOUS | Status: DC | PRN
  Administered 2013-01-15: 50 mg via INTRAVENOUS

## 2013-01-15 MED ORDER — SUFENTANIL CITRATE 50 MCG/ML IV SOLN
INTRAVENOUS | Status: DC | PRN
  Administered 2013-01-15: 10 ug via INTRAVENOUS
  Administered 2013-01-15: 15 ug via INTRAVENOUS

## 2013-01-15 MED ORDER — LACTATED RINGERS IV SOLN
INTRAVENOUS | Status: DC | PRN
  Administered 2013-01-15 (×2): via INTRAVENOUS

## 2013-01-15 MED ORDER — PHENYLEPHRINE HCL 10 MG/ML IJ SOLN
INTRAMUSCULAR | Status: DC | PRN
  Administered 2013-01-15: 80 ug via INTRAVENOUS
  Administered 2013-01-15: 40 ug via INTRAVENOUS

## 2013-01-15 MED ORDER — BUDESONIDE 0.5 MG/2ML IN SUSP
0.5000 mg | Freq: Two times a day (BID) | RESPIRATORY_TRACT | Status: DC
  Administered 2013-01-16 – 2013-01-17 (×3): 0.5 mg via RESPIRATORY_TRACT
  Filled 2013-01-15 (×6): qty 2

## 2013-01-15 MED ORDER — ONDANSETRON HCL 4 MG/2ML IJ SOLN
INTRAMUSCULAR | Status: AC
  Filled 2013-01-15: qty 2

## 2013-01-15 MED ORDER — HYDROMORPHONE HCL PF 1 MG/ML IJ SOLN
0.2500 mg | INTRAMUSCULAR | Status: DC | PRN
  Administered 2013-01-15 (×2): 0.5 mg via INTRAVENOUS

## 2013-01-15 MED ORDER — 0.9 % SODIUM CHLORIDE (POUR BTL) OPTIME
TOPICAL | Status: DC | PRN
  Administered 2013-01-15: 2000 mL

## 2013-01-15 MED ORDER — ACETAMINOPHEN 10 MG/ML IV SOLN
1000.0000 mg | Freq: Once | INTRAVENOUS | Status: AC | PRN
  Administered 2013-01-15: 1000 mg via INTRAVENOUS

## 2013-01-15 MED ORDER — ESCITALOPRAM OXALATE 10 MG PO TABS
10.0000 mg | ORAL_TABLET | Freq: Every day | ORAL | Status: DC
  Administered 2013-01-15 – 2013-01-17 (×3): 10 mg via ORAL
  Filled 2013-01-15 (×3): qty 1

## 2013-01-15 MED ORDER — DIPHENHYDRAMINE HCL 12.5 MG/5ML PO ELIX: 12.5000 mg | ORAL_SOLUTION | Freq: Four times a day (QID) | ORAL | Status: DC | PRN

## 2013-01-15 MED ORDER — ONDANSETRON HCL 4 MG/2ML IJ SOLN
INTRAMUSCULAR | Status: DC | PRN
  Administered 2013-01-15: 4 mg via INTRAVENOUS

## 2013-01-15 MED ORDER — SUCCINYLCHOLINE CHLORIDE 20 MG/ML IJ SOLN
INTRAMUSCULAR | Status: DC | PRN
  Administered 2013-01-15: 100 mg via INTRAVENOUS

## 2013-01-15 MED ORDER — VANCOMYCIN HCL 10 G IV SOLR
1250.0000 mg | Freq: Two times a day (BID) | INTRAVENOUS | Status: DC
  Administered 2013-01-15 – 2013-01-17 (×4): 1250 mg via INTRAVENOUS
  Filled 2013-01-15 (×6): qty 1250

## 2013-01-15 MED ORDER — MIDAZOLAM HCL 5 MG/5ML IJ SOLN
INTRAMUSCULAR | Status: DC | PRN
  Administered 2013-01-15: 1 mg via INTRAVENOUS

## 2013-01-15 MED ORDER — ONDANSETRON HCL 4 MG/2ML IJ SOLN
4.0000 mg | Freq: Four times a day (QID) | INTRAMUSCULAR | Status: DC | PRN
  Administered 2013-01-15: 4 mg via INTRAVENOUS

## 2013-01-15 MED ORDER — IOHEXOL 300 MG/ML  SOLN
25.0000 mL | INTRAMUSCULAR | Status: AC
  Administered 2013-01-15 (×2): 25 mL via ORAL

## 2013-01-15 MED ORDER — IOHEXOL 300 MG/ML  SOLN: 100.0000 mL | Freq: Once | INTRAMUSCULAR | Status: DC | PRN

## 2013-01-15 MED ORDER — ALBUTEROL SULFATE (5 MG/ML) 0.5% IN NEBU
2.5000 mg | INHALATION_SOLUTION | Freq: Four times a day (QID) | RESPIRATORY_TRACT | Status: DC
  Administered 2013-01-16 – 2013-01-17 (×6): 2.5 mg via RESPIRATORY_TRACT
  Filled 2013-01-15 (×6): qty 0.5

## 2013-01-15 MED ORDER — ALBUTEROL SULFATE HFA 108 (90 BASE) MCG/ACT IN AERS: 2.0000 | INHALATION_SPRAY | Freq: Four times a day (QID) | RESPIRATORY_TRACT | Status: DC | PRN

## 2013-01-15 MED ORDER — OXYCODONE-ACETAMINOPHEN 5-325 MG PO TABS
1.0000 | ORAL_TABLET | ORAL | Status: DC | PRN
  Administered 2013-01-16 – 2013-01-17 (×3): 2 via ORAL
  Filled 2013-01-15 (×3): qty 2

## 2013-01-15 MED ORDER — DIPHENHYDRAMINE HCL 50 MG/ML IJ SOLN: 12.5000 mg | Freq: Four times a day (QID) | INTRAMUSCULAR | Status: DC | PRN

## 2013-01-15 MED ORDER — PROPOFOL 10 MG/ML IV BOLUS
INTRAVENOUS | Status: DC | PRN
  Administered 2013-01-15: 40 mg via INTRAVENOUS
  Administered 2013-01-15: 120 mg via INTRAVENOUS

## 2013-01-15 MED ORDER — ONDANSETRON HCL 4 MG/2ML IJ SOLN: 4.0000 mg | Freq: Once | INTRAMUSCULAR | Status: DC | PRN

## 2013-01-15 MED ORDER — HYDROMORPHONE HCL PF 1 MG/ML IJ SOLN: 0.2500 mg | INTRAMUSCULAR | Status: DC | PRN

## 2013-01-15 MED ORDER — PIPERACILLIN-TAZOBACTAM 3.375 G IVPB
3.3750 g | Freq: Three times a day (TID) | INTRAVENOUS | Status: DC
  Administered 2013-01-15 – 2013-01-17 (×6): 3.375 g via INTRAVENOUS
  Filled 2013-01-15 (×9): qty 50

## 2013-01-15 MED ORDER — AMLODIPINE BESYLATE 5 MG PO TABS
5.0000 mg | ORAL_TABLET | Freq: Every day | ORAL | Status: DC
Start: 2013-01-15 — End: 2013-01-17
  Administered 2013-01-15 – 2013-01-17 (×3): 5 mg via ORAL
  Filled 2013-01-15 (×3): qty 1

## 2013-01-15 MED ORDER — MORPHINE SULFATE 2 MG/ML IJ SOLN
1.0000 mg | INTRAMUSCULAR | Status: DC | PRN
  Administered 2013-01-15: 2 mg via INTRAVENOUS
  Filled 2013-01-15: qty 1

## 2013-01-15 MED ORDER — KCL IN DEXTROSE-NACL 20-5-0.45 MEQ/L-%-% IV SOLN
INTRAVENOUS | Status: DC
  Administered 2013-01-15 – 2013-01-16 (×3): via INTRAVENOUS
  Filled 2013-01-15 (×5): qty 1000

## 2013-01-15 SURGICAL SUPPLY — 22 items
BANDAGE GAUZE ELAST BULKY 4 IN (GAUZE/BANDAGES/DRESSINGS) IMPLANT
CANISTER SUCTION 2500CC (MISCELLANEOUS) ×2 IMPLANT
CLOTH BEACON ORANGE TIMEOUT ST (SAFETY) ×2 IMPLANT
CONNECTOR 5 IN 1 STRAIGHT STRL (MISCELLANEOUS) ×1 IMPLANT
COVER SURGICAL LIGHT HANDLE (MISCELLANEOUS) ×2 IMPLANT
DRAPE LAPAROSCOPIC ABDOMINAL (DRAPES) ×2 IMPLANT
DRAPE UTILITY 15X26 W/TAPE STR (DRAPE) ×4 IMPLANT
DRSG PAD ABDOMINAL 8X10 ST (GAUZE/BANDAGES/DRESSINGS) ×2 IMPLANT
ELECT CAUTERY BLADE 6.4 (BLADE) ×2 IMPLANT
ELECT REM PT RETURN 9FT ADLT (ELECTROSURGICAL) ×2
ELECTRODE REM PT RTRN 9FT ADLT (ELECTROSURGICAL) ×1 IMPLANT
GOWN STRL NON-REIN LRG LVL3 (GOWN DISPOSABLE) ×4 IMPLANT
KIT BASIN OR (CUSTOM PROCEDURE TRAY) ×2 IMPLANT
KIT ROOM TURNOVER OR (KITS) ×2 IMPLANT
NS IRRIG 1000ML POUR BTL (IV SOLUTION) ×2 IMPLANT
PACK GENERAL/GYN (CUSTOM PROCEDURE TRAY) ×2 IMPLANT
PAD ARMBOARD 7.5X6 YLW CONV (MISCELLANEOUS) ×2 IMPLANT
SPONGE GAUZE 4X4 12PLY (GAUZE/BANDAGES/DRESSINGS) ×2 IMPLANT
SWAB COLLECTION DEVICE MRSA (MISCELLANEOUS) ×1 IMPLANT
TOWEL OR 17X24 6PK STRL BLUE (TOWEL DISPOSABLE) ×2 IMPLANT
TOWEL OR 17X26 10 PK STRL BLUE (TOWEL DISPOSABLE) ×2 IMPLANT
TUBE ANAEROBIC SPECIMEN COL (MISCELLANEOUS) ×1 IMPLANT

## 2013-01-15 NOTE — Anesthesia Preprocedure Evaluation (Addendum)
Anesthesia Evaluation  Patient identified by MRN, date of birth, ID band Patient awake    Reviewed: Allergy & Precautions, H&P , NPO status , Patient's Chart, lab work & pertinent test results  History of Anesthesia Complications Negative for: history of anesthetic complications  Airway Mallampati: II TM Distance: >3 FB Neck ROM: Full    Dental  (+) Teeth Intact and Dental Advisory Given,    Pulmonary shortness of breath and with exertion, asthma , pneumonia -, resolved, COPD COPD inhaler and oxygen dependent,  + rhonchi   Pulmonary exam normal + decreased breath sounds      Cardiovascular Exercise Tolerance: Poor hypertension, Pt. on medications Rhythm:Regular Rate:Normal     Neuro/Psych PSYCHIATRIC DISORDERS Depression    GI/Hepatic Neg liver ROS, GERD-  ,Pt is nauseated   Endo/Other  negative endocrine ROS  Renal/GU negative Renal ROS     Musculoskeletal  (+) Arthritis -, Osteoarthritis,    Abdominal   Peds  Hematology negative hematology ROS (+)   Anesthesia Other Findings   Reproductive/Obstetrics negative OB ROS                        Anesthesia Physical Anesthesia Plan  ASA: III  Anesthesia Plan: General   Post-op Pain Management:    Induction: Intravenous  Airway Management Planned: Oral ETT  Additional Equipment:   Intra-op Plan:   Post-operative Plan: Extubation in OR  Informed Consent: I have reviewed the patients History and Physical, chart, labs and discussed the procedure including the risks, benefits and alternatives for the proposed anesthesia with the patient or authorized representative who has indicated his/her understanding and acceptance.   Dental advisory given  Plan Discussed with: CRNA and Surgeon  Anesthesia Plan Comments: (S/P ventral hernia repair 12/25/12 with pot-op hematoma/abscess COPD on home O2 Htn  Plan GA with oral ETT  Kipp Brood,  MD)       Anesthesia Quick Evaluation

## 2013-01-15 NOTE — Anesthesia Postprocedure Evaluation (Signed)
  Anesthesia Post-op Note  Patient: Michael Ellison.  Procedure(s) Performed: Procedure(s): INCISION AND DRAINAGE Abdominal Wall ABSCESS (N/A)  Patient Location: PACU  Anesthesia Type:General  Level of Consciousness: awake and alert   Airway and Oxygen Therapy: Patient Spontanous Breathing  Post-op Pain: mild  Post-op Assessment: Post-op Vital signs reviewed, Patient's Cardiovascular Status Stable, Respiratory Function Stable, Patent Airway, No signs of Nausea or vomiting and Pain level controlled  Post-op Vital Signs: stable  Complications: No apparent anesthesia complications

## 2013-01-15 NOTE — ED Notes (Signed)
Patient with hernia surgery on March 10, patient states bleeding from jp drain site, jp drain pulled x 1 week ago

## 2013-01-15 NOTE — Anesthesia Procedure Notes (Signed)
Procedure Name: Intubation Date/Time: 01/15/2013 6:38 PM Performed by: Charm Barges, Hubert Derstine R Pre-anesthesia Checklist: Patient identified, Emergency Drugs available, Suction available, Patient being monitored and Timeout performed Patient Re-evaluated:Patient Re-evaluated prior to inductionOxygen Delivery Method: Circle system utilized Preoxygenation: Pre-oxygenation with 100% oxygen Intubation Type: IV induction Ventilation: Mask ventilation without difficulty Laryngoscope Size: Mac and 4 Grade View: Grade I Tube type: Parker flex tip Tube size: 8.0 mm Number of attempts: 1 Airway Equipment and Method: Stylet Placement Confirmation: ETT inserted through vocal cords under direct vision,  positive ETCO2 and breath sounds checked- equal and bilateral Secured at: 22 cm Tube secured with: Tape Dental Injury: Teeth and Oropharynx as per pre-operative assessment

## 2013-01-15 NOTE — ED Notes (Signed)
Family at bedside. 

## 2013-01-15 NOTE — Progress Notes (Signed)
ANTIBIOTIC CONSULT NOTE - INITIAL  Pharmacy Consult for Vancomycin/Zosyn Indication: abdominal wall cellulitis  Allergies  Allergen Reactions  . Lorazepam Other (See Comments)    Severe agitated delirium. Tolerates midazolam and other benzo's    Patient Measurements: Height: 6' 3.2" (191 cm) Weight: 194 lb 0.1 oz (88 kg) IBW/kg (Calculated) : 84.95  Vital Signs: Temp: 99.3 F (37.4 C) (03/31 1037) Temp src: Oral (03/31 1037) BP: 127/74 mmHg (03/31 1400) Pulse Rate: 95 (03/31 1400) Intake/Output from previous day:   Intake/Output from this shift:    Labs:  Recent Labs  01/15/13 1201  WBC 11.9*  HGB 10.1*  PLT 402*  CREATININE 0.76   Estimated Creatinine Clearance: 110.7 ml/min (by C-G formula based on Cr of 0.76). No results found for this basename: VANCOTROUGH, Leodis Binet, VANCORANDOM, GENTTROUGH, GENTPEAK, GENTRANDOM, TOBRATROUGH, TOBRAPEAK, TOBRARND, AMIKACINPEAK, AMIKACINTROU, AMIKACIN,  in the last 72 hours   Microbiology: Recent Results (from the past 720 hour(s))  SURGICAL PCR SCREEN     Status: None   Collection Time    12/25/12  6:38 AM      Result Value Range Status   MRSA, PCR NEGATIVE  NEGATIVE Final   Staphylococcus aureus NEGATIVE  NEGATIVE Final   Comment:            The Xpert SA Assay (FDA     approved for NASAL specimens     in patients over 32 years of age),     is one component of     a comprehensive surveillance     program.  Test performance has     been validated by [employer] for patients greater     than or equal to 80 year old.     It is not intended     to diagnose infection nor to     guide or monitor treatment.    Medical History: Past Medical History  Diagnosis Date  . Hypertension   . Cancer   . Cancer of prostate   . Depression   . Asthma   . Shortness of breath   . COPD (chronic obstructive pulmonary disease)     home O2    Medications:  Prescriptions prior to admission  Medication Sig Dispense Refill  .  albuterol (PROVENTIL HFA;VENTOLIN HFA) 108 (90 BASE) MCG/ACT inhaler Inhale 2 puffs into the lungs every 6 (six) hours as needed for shortness of breath.      Marland Kitchen albuterol (PROVENTIL) (5 MG/ML) 0.5% nebulizer solution Take 2.5 mg by nebulization every 6 (six) hours. AND Q3 PRN SOB or Wheezing      . amLODipine (NORVASC) 5 MG tablet Take 5 mg by mouth daily.      . budesonide (PULMICORT) 0.5 MG/2ML nebulizer solution Take 0.5 mg by nebulization 2 (two) times daily.      Marland Kitchen escitalopram (LEXAPRO) 10 MG tablet Take 10 mg by mouth daily.      . furosemide (LASIX) 40 MG tablet Take 40 mg by mouth daily.      Marland Kitchen HYDROcodone-acetaminophen (VICODIN) 5-500 MG per tablet Take 1 tablet by mouth every 6 (six) hours as needed for pain.       Assessment: 67 y/o male patient admitted with abdominal cellulitis after recent ex lap with hernia repair requiring broad spectrum antibiotics.  Goal of Therapy:  Vancomycin trough level 10-15 mcg/ml  Plan:  Vancomycin 1250mg  IV q12h , zosyn 3.375g IV q8h and monitor renal function. Measure antibiotic drug levels at steady  state Follow up culture results  Verlene Mayer, PharmD, BCPS Pager [phone] 01/15/2013,3:25 PM

## 2013-01-15 NOTE — H&P (Signed)
Michael Ellison. 1946/06/18  [id number].   Primary Care MD: Dr. Felecia Shelling Chief Complaint/Reason for Consult: abdominal pain and drainage from JP drain site HPI: This is a 67 yo male who recently underwent an ex lap with incisional hernia repair with component separation with underlay stratus biologic mesh by Dr. Lindie Spruce.  He has been seen several times postoperatively and had each JP drain removed on separate occasions.  The LLQ drain was just removed on 01/09/13.  It apparently had old hematoma like drainage present.  He did well until yesterday, he started to develop profuse old bloody drainage mixed with pus.  He developed a HA, dizziness, weakness.  He has still been eating well.  He had a normal BM this morning.  No nausea.  He came to the ED today for evaluation.  Review of systems: Please see HPI, otherwise the patient admits to home O2 use prn  Family History  Problem Relation Age of Onset  . Pseudochol deficiency Neg Hx   . Anesthesia problems Neg Hx   . Hypotension Neg Hx   . Malignant hyperthermia Neg Hx     Past Medical History  Diagnosis Date  . Hypertension   . Cancer   . Cancer of prostate   . Depression   . Asthma   . Shortness of breath   . COPD (chronic obstructive pulmonary disease)     home O2    Past Surgical History  Procedure Laterality Date  . Hernia repair    . Prostate biopsy    . Robotic prostate surgery  2011  . Laparotomy  02/25/2012    Procedure: EXPLORATORY LAPAROTOMY;  Surgeon: Michael Bering, MD;  Location: [facility];  Service: General;  Laterality: N/A;  . Bowel resection  02/25/2012    Procedure: SMALL BOWEL RESECTION;  Surgeon: Michael Bering, MD;  Location: [facility];  Service: General;;  . Wound debridement  03/02/2012    Procedure: DEBRIDEMENT CLOSURE/ABDOMINAL WOUND;  Surgeon: Michael Ridges, MD;  Location: [facility] OR;  Service: General;  Laterality: N/A;  . Tracheostomy  03/2012  . Ventral hernia repair  12/25/2012     Dr Lindie Spruce  . Ventral hernia  repair N/A 12/25/2012    Procedure: HERNIA REPAIR VENTRAL ADULT;  Surgeon: Michael Ridges, MD;  Location: [facility] OR;  Service: General;  Laterality: N/A;  . Insertion of mesh N/A 12/25/2012    Procedure: INSERTION OF MESH;  Surgeon: Michael Ridges, MD;  Location: [facility] OR;  Service: General;  Laterality: N/A;    Social History:  reports that he quit smoking about 10 months ago. His smoking use included Cigarettes. He has a 66 pack-year smoking history. He has never used smokeless tobacco. He reports that  drinks alcohol. He reports that he does not use illicit drugs.  Allergies:  Allergies  Allergen Reactions  . Lorazepam Other (See Comments)    Severe agitated delirium. Tolerates midazolam and other benzo's     (Not in a hospital admission)  Blood pressure 132/79, pulse 96, temperature 99.3 F (37.4 C), temperature source Oral, resp. rate 14, SpO2 96.00%. Physical Exam: General: pleasant, WD, WN black male who is laying in bed in NAD HEENT: head is normocephalic, atraumatic.  Sclera are noninjected.  PERRL.  Ears and nose without any masses or lesions.  Mouth is pink and moist Heart: regular, rate, and rhythm.  Normal s1,s2. No obvious murmurs, gallops, or rubs noted.  Palpable radial and pedal pulses bilaterally Lungs: CTAB, no wheezes,  rhonchi, or rales noted.  Respiratory effort nonlabored Abd: soft, but firm, hypoactive bowel sounds, tender in lower abdomen, greatest in LLQ, old JP site in LLQ with profuse old bloody, purulent drainage.  This has saturated his clothes, sheets, and dressings.  Extensive cellulitis and induration involving the LLQ around his left flank.  Midline incision is well healed. MS: all 4 extremities are symmetrical with no cyanosis, clubbing, or edema. Skin: warm and dry with no masses, lesions, or rashes Psych: A&Ox3 with an appropriate affect.    Results for orders placed during the hospital encounter of 01/15/13 (from the past 48 hour(s))  CBC     Status:  Abnormal   Collection Time    01/15/13 12:01 PM      Result Value Range   WBC 11.9 (*) 4.0 - 10.5 K/uL   RBC 3.93 (*) 4.22 - 5.81 MIL/uL   Hemoglobin 10.1 (*) 13.0 - 17.0 g/dL   HCT 16.1 (*) 09.6 - 04.5 %   MCV 76.8 (*) 78.0 - 100.0 fL   MCH 25.7 (*) 26.0 - 34.0 pg   MCHC 33.4  30.0 - 36.0 g/dL   RDW 40.9  81.1 - 91.4 %   Platelets 402 (*) 150 - 400 K/uL   No results found.     Assessment/Plan 1. S/p ex lap with incisional hernia repair with component separation and underlay of stratus mesh 2. LLQ and left flank abdominal wall cellulitis likely secondary to infected hematoma Patient Active Problem List  Diagnosis     . Alcohol abuse     . BP (high blood pressure)              . COPD (chronic obstructive pulmonary disease)  . Ventral hernia without obstruction or gangrene  . Incisional hernia  . Anemia  . Post op infection  . Cellulitis of left abdominal wall  . Infected hematoma following procedure   Plan: 1. Will admit for IV abx therapy.  He needs a CT scan to better evaluate just how extensive this process is.  He will likely need further drainage. 2. Start IVFs, keep NPO incase he needs intervention. 3. Dressing changes prn to help with control of drainage. 4. Will d/w Dr. Lindie Spruce and more recommendations after his evaluation. 5. He will be restarted on most of his home meds.  Michael Ellison E 01/15/2013, 12:44 PM Pager: [phone]

## 2013-01-15 NOTE — Preoperative (Signed)
Beta Blockers   Not applicable

## 2013-01-15 NOTE — ED Provider Notes (Addendum)
History     CSN: [id number]  Arrival date & time 01/15/13  1025   First MD Initiated Contact with Patient 01/15/13 1046      Chief Complaint  Patient presents with  . Bleeding/Bruising    post op site    (Consider location/radiation/quality/duration/timing/severity/associated sxs/prior treatment) HPI Patient presents with some left lower quadrant from drain site onset 3-4 days ago. Has minimal pain at site no fever no diminished appetite. Last bowel movement yesterday, "slightly runny" no blood per rectum no treatment prior to coming here no other associated symptoms  Past Medical History  Diagnosis Date  . Hypertension   . Cancer   . Cancer of prostate   . Depression   . Asthma   . Shortness of breath   . COPD (chronic obstructive pulmonary disease)     home O2    Past Surgical History  Procedure Laterality Date  . Hernia repair    . Prostate biopsy    . Robotic prostate surgery  2011  . Laparotomy  02/25/2012    Procedure: EXPLORATORY LAPAROTOMY;  Surgeon: [name], MD;  Location: [facility];  Service: General;  Laterality: N/A;  . Bowel resection  02/25/2012    Procedure: SMALL BOWEL RESECTION;  Surgeon: [name], MD;  Location: [facility];  Service: General;;  . Wound debridement  03/02/2012    Procedure: DEBRIDEMENT CLOSURE/ABDOMINAL WOUND;  Surgeon: Cherylynn Ridges, MD;  Location: [facility] OR;  Service: General;  Laterality: N/A;  . Tracheostomy  03/2012  . Ventral hernia repair  12/25/2012     Dr Lindie Spruce  . Ventral hernia repair N/A 12/25/2012    Procedure: HERNIA REPAIR VENTRAL ADULT;  Surgeon: Cherylynn Ridges, MD;  Location: [facility] OR;  Service: General;  Laterality: N/A;  . Insertion of mesh N/A 12/25/2012    Procedure: INSERTION OF MESH;  Surgeon: Cherylynn Ridges, MD;  Location: [facility] OR;  Service: General;  Laterality: N/A;    Family History  Problem Relation Age of Onset  . Pseudochol deficiency Neg Hx   . Anesthesia problems Neg Hx   . Hypotension Neg Hx   . Malignant  hyperthermia Neg Hx     History  Substance Use Topics  . Smoking status: Former Smoker -- 1.50 packs/day for 44 years    Types: Cigarettes    Quit date: 02/22/2012  . Smokeless tobacco: Never Used  . Alcohol Use: Yes     Comment: occasional beer 1-2      Review of Systems  Constitutional: Negative.   HENT: Negative.   Respiratory: Negative.   Cardiovascular: Negative.   Gastrointestinal: Negative.   Musculoskeletal: Negative.   Skin: Negative.   Neurological: Negative.   Psychiatric/Behavioral: Negative.   All other systems reviewed and are negative.    Allergies  Lorazepam  Home Medications   Current Outpatient Rx  Name  Route  Sig  Dispense  Refill  . albuterol (PROVENTIL HFA;VENTOLIN HFA) 108 (90 BASE) MCG/ACT inhaler   Inhalation   Inhale 2 puffs into the lungs every 6 (six) hours as needed for shortness of breath.         Marland Kitchen albuterol (PROVENTIL) (5 MG/ML) 0.5% nebulizer solution   Nebulization   Take 2.5 mg by nebulization every 6 (six) hours. AND Q3 PRN SOB or Wheezing         . amLODipine (NORVASC) 5 MG tablet   Oral   Take 5 mg by mouth daily.         Marland Kitchen  budesonide (PULMICORT) 0.5 MG/2ML nebulizer solution   Nebulization   Take 0.5 mg by nebulization 2 (two) times daily.         Marland Kitchen escitalopram (LEXAPRO) 10 MG tablet   Oral   Take 10 mg by mouth daily.         . furosemide (LASIX) 40 MG tablet   Oral   Take 40 mg by mouth daily.         Marland Kitchen HYDROcodone-acetaminophen (VICODIN) 5-500 MG per tablet   Oral   Take 1 tablet by mouth every 6 (six) hours as needed for pain.           BP 132/79  Pulse 96  Temp(Src) 99.3 F (37.4 C) (Oral)  Resp 14  SpO2 96%  Physical Exam  Nursing note and vitals reviewed. Constitutional: He appears well-developed and well-nourished.  HENT:  Head: Normocephalic and atraumatic.  Eyes: Conjunctivae are normal. Pupils are equal, round, and reactive to light.  Neck: Neck supple. No tracheal deviation  present. No thyromegaly present.  Cardiovascular: Normal rate and regular rhythm.   No murmur heard. Pulmonary/Chest: Effort normal and breath sounds normal.  Abdominal: Soft. Bowel sounds are normal. He exhibits no distension. There is no tenderness.  Midline surgical scar. Approximately 1 cm diameter open wound at left lower abdomen at panniculus which is actively using purulent blood-tinged material, minimally tender at site  Musculoskeletal: Normal range of motion. He exhibits no edema and no tenderness.  Neurological: He is alert. Coordination normal.  Skin: Skin is warm and dry. No rash noted.  Psychiatric: He has a normal mood and affect.    ED Course  Procedures (including critical care time)  Labs Reviewed - No data to display No results found. No diagnosis found. Results for orders placed during the hospital encounter of 01/15/13  CBC      Result Value Range   WBC 11.9 (*) 4.0 - 10.5 K/uL   RBC 3.93 (*) 4.22 - 5.81 MIL/uL   Hemoglobin 10.1 (*) 13.0 - 17.0 g/dL   HCT 16.1 (*) 09.6 - 04.5 %   MCV 76.8 (*) 78.0 - 100.0 fL   MCH 25.7 (*) 26.0 - 34.0 pg   MCHC 33.4  30.0 - 36.0 g/dL   RDW 40.9  81.1 - 91.4 %   Platelets 402 (*) 150 - 400 K/uL  BASIC METABOLIC PANEL      Result Value Range   Sodium 135  135 - 145 mEq/L   Potassium 4.0  3.5 - 5.1 mEq/L   Chloride 103  96 - 112 mEq/L   CO2 22  19 - 32 mEq/L   Glucose, Bld 98  70 - 99 mg/dL   BUN 10  6 - 23 mg/dL   Creatinine, Ser 7.82  0.50 - 1.35 mg/dL   Calcium 8.9  8.4 - 95.6 mg/dL   GFR calc non Af Amer >90  >90 mL/min   GFR calc Af Amer >90  >90 mL/min   Chest 2 View  12/25/2012  *RADIOLOGY REPORT*  Clinical Data: Preop for hernia repair, some shortness of  CHEST - 2 VIEW  Comparison: Portable chest x-ray of 04/21/2012  Findings: No active infiltrate or effusion is seen.  Bullous changes are noted in both upper lobes to the apices. There is a nodular opacities seen only on the lateral view overlying the thoracic  spine.  Generally this is due to degenerative change, but on the CT from 2013 no spurring is seen at that  site.  Therefore attention to this area on follow-up chest x-ray is recommended to exclude a lung lesion.  The heart is mildly enlarged.  Mediastinal contours appear stable.  No acute bony abnormality is noted.  IMPRESSION:  1.  Bullous changes in both lung apices.  No active lung disease. Stable mild cardiomegaly. 2.  Nodular opacity on the lateral view may be due to overlapping bony and vascular structures but recommend attention to this area on follow-up chest x-ray.   Original Report Authenticated By: Dwyane Dee, M.D.     Patient evaluated by surgical service who will arrange for further diagnostic testing and admission  MDM  Diagnosis postoperative bleeding and infection        Doug Sou, MD 01/15/13 1205  Doug Sou, MD 01/16/13 [phone]

## 2013-01-15 NOTE — Telephone Encounter (Signed)
Michael Ellison made aware of patient coming to ER and FYI page sent to Dr Lindie Spruce.

## 2013-01-15 NOTE — Telephone Encounter (Signed)
Patient's daughter called and stated since removal of drain last Tuesday the patient was doing fine. He had some spotting from drain site that had slowed down. Yesterday drain site started bleeding again. They states some bright red blood. They state it is soaking through his clothes. He is feeling light headed and dizzy today and having bad headaches. I advised they take patient to the ER at [facility] for evaluation.

## 2013-01-15 NOTE — Transfer of Care (Signed)
Immediate Anesthesia Transfer of Care Note  Patient: Michael Ellison.  Procedure(s) Performed: Procedure(s): INCISION AND DRAINAGE Abdominal Wall ABSCESS (N/A)  Patient Location: PACU  Anesthesia Type:General  Level of Consciousness: awake, alert  and oriented  Airway & Oxygen Therapy: Patient Spontanous Breathing and Patient connected to nasal cannula oxygen  Post-op Assessment: Report given to PACU RN and Post -op Vital signs reviewed and stable  Post vital signs: Reviewed and stable  Complications: No apparent anesthesia complications

## 2013-01-15 NOTE — Op Note (Signed)
OPERATIVE REPORT  DATE OF OPERATION: 01/15/2013  PATIENT:  Michael Ellison.  67 y.o. male  PRE-OPERATIVE DIAGNOSIS:  Infected abdominal wall hematoma  POST-OPERATIVE DIAGNOSIS:  same  PROCEDURE:  Procedure(s): INCISION AND DRAINAGE Abdominal Wall ABSCESS  SURGEON:  Surgeon(s): Cherylynn Ridges, MD  ASSISTANT: None  ANESTHESIA:   general  EBL: <50 ml  BLOOD ADMINISTERED: none  DRAINS: Penrose drain in the subcutaenous tissue x 2, 1/2 inch drains, 36 inches long.   SPECIMEN:  Source of Specimen:  aerobic and anaerobic cultures  COUNTS CORRECT:  YES  PROCEDURE DETAILS: The patient was taken to the operating room and placed on the table in the supine position. After an adequate general endotracheal anesthetic was administered he was noted that he was draining a large amount of cloudy bloody fluid from the left lower quadrant site where he had a previous Blake drain. He was then prepped and draped in usual sterile manner.  After proper time out was performed identifying the patient and the procedure to be performed we enlarged the incision in the left lower quadrant and subsequently aspirated out a large amount of old subcutaneous hematoma some which was purulent in order which was seemingly just old clot.  We irrigated with 2 L of warm saline solution. This was done in all 4 quadrants of the large subcutaneous flap. The entire flap was viable those minimal drainage from any site except for the left lower quadrant area. We subsequently placed 236 inch long half inch Penrose drains into the wound and brought out the left lower quadrant, suturing them in place with 2-0 nylon suture. A sterile dressing was applied. All needle counts, sponge counts, and instrument counts were correct.  PATIENT DISPOSITION:  PACU - hemodynamically stable.   Cherylynn Ridges 3/31/20147:08 PM

## 2013-01-15 NOTE — H&P (Signed)
I saw Mr. Sample and will likely take him back to the OR for drainage after his CT scan has been done.  Marta Lamas. [name], MD, FACS [phone] [facility] Surgery

## 2013-01-16 ENCOUNTER — Encounter (HOSPITAL_COMMUNITY): Payer: Self-pay | Admitting: Surgery

## 2013-01-16 LAB — BASIC METABOLIC PANEL
CO2: 23 mEq/L (ref 19–32)
Glucose, Bld: 110 mg/dL — ABNORMAL HIGH (ref 70–99)
Potassium: 3.7 mEq/L (ref 3.5–5.1)
Sodium: 137 mEq/L (ref 135–145)

## 2013-01-16 LAB — CBC
HCT: 30 % — ABNORMAL LOW (ref 39.0–52.0)
Hemoglobin: 9.7 g/dL — ABNORMAL LOW (ref 13.0–17.0)
MCH: 25.3 pg — ABNORMAL LOW (ref 26.0–34.0)
MCV: 78.1 fL (ref 78.0–100.0)
RBC: 3.84 MIL/uL — ABNORMAL LOW (ref 4.22–5.81)

## 2013-01-16 NOTE — Progress Notes (Signed)
GS Progress Note Subjective: Patient a bit diaphoretic.  Drainage from wound is expected.  Getting a good sleep this morning.  Objective: Vital signs in last 24 hours: Temp:  [97.9 F (36.6 C)-99.3 F (37.4 C)] 97.9 F (36.6 C) (04/01 0528) Pulse Rate:  [82-109] 82 (04/01 0528) Resp:  [14-28] 20 (04/01 0528) BP: (112-138)/(71-88) 112/71 mmHg (04/01 0528) SpO2:  [92 %-100 %] 93 % (04/01 0528) Weight:  [85.4 kg (188 lb 4.4 oz)-88 kg (194 lb 0.1 oz)] 85.4 kg (188 lb 4.4 oz) (03/31 2029) Last BM Date: 01/15/13  Intake/Output from previous day: 03/31 0701 - 04/01 0700 In: 2412.3 [P.O.:444; I.V.:1968.3] Out: 350 [Urine:350] Intake/Output this shift:    Lungs: Clear  Abd: good bowel sounds.  No diet yet  Extremities: No DVT signs or symptoms  Neuro: Intact  Lab Results: CBC   Recent Labs  01/15/13 1201 01/16/13 0610  WBC 11.9* 8.8  HGB 10.1* 9.7*  HCT 30.2* 30.0*  PLT 402* 411*   BMET  Recent Labs  01/15/13 1201  NA 135  K 4.0  CL 103  CO2 22  GLUCOSE 98  BUN 10  CREATININE 0.76  CALCIUM 8.9   PT/INR No results found for this basename: LABPROT, INR,  in the last 72 hours ABG No results found for this basename: PHART, PCO2, PO2, HCO3,  in the last 72 hours  Studies/Results: Ct Abdomen Pelvis Wo Contrast  01/15/2013  *RADIOLOGY REPORT*  Clinical Data: Recent ventral hernia repair with biologic mesh, now with abdominal wall cellulitis and postoperative fluid collection. The patient is bleeding from the previous lead placed drain site in the left lower quadrant.  CT ABDOMEN AND PELVIS WITHOUT CONTRAST  Technique:  Multidetector CT imaging of the abdomen and pelvis was performed following the standard protocol without intravenous contrast.  Comparison: CT abdomen and pelvis 02/29/2012.  Findings: Fluid collection containing gas bubbles in the anterior abdominal wall subcutaneous tissues, maximum measurements approximating 2.3 x 16.0 x 16.5 cm; the fluid collection  is predominately to the right of midline, but does extend slightly to the left of midline as well. There is overlying edema/induration in the subcutaneous fat of the anterior abdominal wall.  The surgical mesh appears intact, and there is no evidence of recurrent abdominal wall hernia.  Normal unenhanced appearance of the liver.  Calcified granuloma in the lateral aspect of the mid spleen, and approximate 1.6 cm focus of accessory splenic tissue inferior to the lower pole of the spleen as noted previously.  Normal unenhanced appearance of the pancreas, gallbladder, and adrenal glands.  No biliary ductal dilation.  Simple cysts involving both kidneys, the largest arising from the left kidney measuring approximately 5.8 x 5.5 cm; within the limits of the unenhanced technique, no significant focal abnormality involving either kidney.  No urinary tract calculi. Mild to moderate aorto-iliofemoral atherosclerosis without aneurysm.  Lipoma involving the wall of the proximal body of the stomach along its greater curvature, better seen than on the prior examination. Small hiatal hernia, unchanged.  Normal appearing small bowel. Descending and sigmoid colon diverticulosis without evidence of acute diverticulitis; remainder of the colon unremarkable.  Normal appendix in the right upper pelvis.  No ascites.  Urinary bladder decompressed and unremarkable.  No prostatic enlargement.  Calcifications involving the vasa deferens.  Bone window images demonstrate degenerative disc disease at L5-S1 and a Schmorl's node in the lower endplate of T12.  Visualized lung bases clear apart from mild atelectasis.  Bullous emphysematous changes noted in  the lung bases.  Heart mildly enlarged with left ventricular predominance.  IMPRESSION:  1.  Large fluid collection in the anterior abdominal wall, containing small gas bubbles.  This is consistent with abscess, infected hematoma, or infected seroma. 2.  Intact surgical mesh without evidence of  recurrent anterior abdominal wall hernia. 3.  No acute abnormalities involving the anatomic abdomen or pelvis. 4.  Small hiatal hernia. 5.  Descending and sigmoid colon diverticulosis without evidence of acute diverticulitis. 6.  Small lipoma involving the proximal body of the stomach along the greater curvature.   Original Report Authenticated By: Hulan Saas, M.D.     Anti-infectives: Anti-infectives   Start     Dose/Rate Route Frequency Ordered Stop   01/15/13 1600  vancomycin (VANCOCIN) 1,250 mg in sodium chloride 0.9 % 250 mL IVPB     1,250 mg 166.7 mL/hr over 90 Minutes Intravenous Every 12 hours 01/15/13 1528     01/15/13 1600  piperacillin-tazobactam (ZOSYN) IVPB 3.375 g     3.375 g 12.5 mL/hr over 240 Minutes Intravenous 3 times per day 01/15/13 1528        Assessment/Plan: s/p Procedure(s): INCISION AND DRAINAGE Abdominal Wall ABSCESS d/c foley Advance diet Decrease IVFs. Keep in hospital at least another day to IV antibiotics.  LOS: 1 day    Marta Lamas. [name], MD, FACS [phone] [facility] Surgery 01/16/2013

## 2013-01-16 NOTE — Progress Notes (Signed)
Orthopedic Tech Progress Note Patient Details:  Michael Ellison Feb 06, 1946 [id number]  Ortho Devices Type of Ortho Device: Abdominal binder Ortho Device/Splint Interventions: Ordered   Shawnie Pons 01/16/2013, 11:43 AM

## 2013-01-17 MED ORDER — AMOXICILLIN-POT CLAVULANATE 875-125 MG PO TABS: 1.0000 | ORAL_TABLET | Freq: Two times a day (BID) | ORAL | Status: DC

## 2013-01-17 MED ORDER — OXYCODONE-ACETAMINOPHEN 5-325 MG PO TABS: 1.0000 | ORAL_TABLET | Freq: Four times a day (QID) | ORAL | Status: DC | PRN

## 2013-01-17 NOTE — Discharge Summary (Signed)
Physician Discharge Summary  Patient ID: Michael Ellison. MRN: [id number] DOB/AGE: [birth year] 67 y.o.  Admit date: 01/15/2013 Discharge date: 01/17/2013  Admitting Diagnosis: LLQ abdominal wall cellulitis, hematoma and abscess  Discharge Diagnosis Patient Active Problem List   Diagnosis Date Noted  . Cellulitis of left abdominal wall 01/15/2013  . Infected hematoma of abdominal wall s/p hernia repair 01/15/2013  . Anemia 01/03/2013  . Incisional hernia s/p open repair with biologic mesh 12/25/2012 11/21/2012  . COPD (chronic obstructive pulmonary disease) 06/23/2012  . Alcohol abuse 03/01/2012  . Ileus following gastrointestinal surgery 03/01/2012  . BP (high blood pressure) 03/01/2012  . Delirium, drug-induced 03/01/2012  . Agitation 03/01/2012    Consultants None  Imaging: Ct Abdomen Pelvis Wo Contrast  01/15/2013  *RADIOLOGY REPORT*  Clinical Data: Recent ventral hernia repair with biologic mesh, now with abdominal wall cellulitis and postoperative fluid collection. The patient is bleeding from the previous lead placed drain site in the left lower quadrant.  CT ABDOMEN AND PELVIS WITHOUT CONTRAST  Technique:  Multidetector CT imaging of the abdomen and pelvis was performed following the standard protocol without intravenous contrast.  Comparison: CT abdomen and pelvis 02/29/2012.  Findings: Fluid collection containing gas bubbles in the anterior abdominal wall subcutaneous tissues, maximum measurements approximating 2.3 x 16.0 x 16.5 cm; the fluid collection is predominately to the right of midline, but does extend slightly to the left of midline as well. There is overlying edema/induration in the subcutaneous fat of the anterior abdominal wall.  The surgical mesh appears intact, and there is no evidence of recurrent abdominal wall hernia.  Normal unenhanced appearance of the liver.  Calcified granuloma in the lateral aspect of the mid spleen, and approximate 1.6 cm focus of  accessory splenic tissue inferior to the lower pole of the spleen as noted previously.  Normal unenhanced appearance of the pancreas, gallbladder, and adrenal glands.  No biliary ductal dilation.  Simple cysts involving both kidneys, the largest arising from the left kidney measuring approximately 5.8 x 5.5 cm; within the limits of the unenhanced technique, no significant focal abnormality involving either kidney.  No urinary tract calculi. Mild to moderate aorto-iliofemoral atherosclerosis without aneurysm.  Lipoma involving the wall of the proximal body of the stomach along its greater curvature, better seen than on the prior examination. Small hiatal hernia, unchanged.  Normal appearing small bowel. Descending and sigmoid colon diverticulosis without evidence of acute diverticulitis; remainder of the colon unremarkable.  Normal appendix in the right upper pelvis.  No ascites.  Urinary bladder decompressed and unremarkable.  No prostatic enlargement.  Calcifications involving the vasa deferens.  Bone window images demonstrate degenerative disc disease at L5-S1 and a Schmorl's node in the lower endplate of T12.  Visualized lung bases clear apart from mild atelectasis.  Bullous emphysematous changes noted in the lung bases.  Heart mildly enlarged with left ventricular predominance.  IMPRESSION:  1.  Large fluid collection in the anterior abdominal wall, containing small gas bubbles.  This is consistent with abscess, infected hematoma, or infected seroma. 2.  Intact surgical mesh without evidence of recurrent anterior abdominal wall hernia. 3.  No acute abnormalities involving the anatomic abdomen or pelvis. 4.  Small hiatal hernia. 5.  Descending and sigmoid colon diverticulosis without evidence of acute diverticulitis. 6.  Small lipoma involving the proximal body of the stomach along the greater curvature.   Original Report Authenticated By: Hulan Saas, M.D.     Procedures Incision and Drainage of  abdominal wall  abscess  Hospital Course:  67 yo male who recently underwent an ex lap with incisional hernia repair with component separation with underlay stratus biologic mesh by Dr. Lindie Spruce. He has been seen several times postoperatively and had each JP drain removed on separate occasions. The LLQ drain was just removed on 01/09/13. It apparently had old hematoma like drainage present. He did well until yesterday, he started to develop profuse old bloody drainage mixed with pus.   Workup showed infected abdominal wall hematoma and mild leukocytosis.  Patient was admitted and underwent procedure listed above.  He was left with 2 penrose drains in the LLQ abdominal wall.  Tolerated procedure well and was transferred to the floor.  Diet was advanced as tolerated.  On POD #2, the patient was voiding well, tolerating diet, ambulating well, pain well controlled, vital signs stable, cellulitis and drainage improved and felt stable for discharge home.  Labs are also stable.  Patient will follow up in our office in 1 weeks and knows to call with questions or concerns.      Medication List    TAKE these medications       albuterol (5 MG/ML) 0.5% nebulizer solution  Commonly known as:  PROVENTIL  Take 2.5 mg by nebulization every 6 (six) hours. AND Q3 PRN SOB or Wheezing     albuterol 108 (90 BASE) MCG/ACT inhaler  Commonly known as:  PROVENTIL HFA;VENTOLIN HFA  Inhale 2 puffs into the lungs every 6 (six) hours as needed for shortness of breath.     amLODipine 5 MG tablet  Commonly known as:  NORVASC  Take 5 mg by mouth daily.     amoxicillin-clavulanate 875-125 MG per tablet  Commonly known as:  AUGMENTIN  Take 1 tablet by mouth 2 (two) times daily.     budesonide 0.5 MG/2ML nebulizer solution  Commonly known as:  PULMICORT  Take 0.5 mg by nebulization 2 (two) times daily.     escitalopram 10 MG tablet  Commonly known as:  LEXAPRO  Take 10 mg by mouth daily.     furosemide 40 MG tablet   Commonly known as:  LASIX  Take 40 mg by mouth daily.     HYDROcodone-acetaminophen 5-500 MG per tablet  Commonly known as:  VICODIN  Take 1 tablet by mouth every 6 (six) hours as needed for pain.     oxyCODONE-acetaminophen 5-325 MG per tablet  Commonly known as:  PERCOCET/ROXICET  Take 1-2 tablets by mouth every 6 (six) hours as needed.             Follow-up Information   Follow up with WYATT, Marta Lamas, MD In 1 week.   Contact information:   [address]  [place], [place] [address] [phone]       Signed: Aris Georgia, [facility] Surgery [phone]  01/17/2013, 9:04 AM

## 2013-01-17 NOTE — Progress Notes (Signed)
Improving D/c home w close followup

## 2013-01-17 NOTE — Progress Notes (Signed)
Patient discharged to home with instructions given to pt. And daughter.

## 2013-01-17 NOTE — Progress Notes (Signed)
2 Days Post-Op  Subjective: Pt feels good, pain is much less.  Wound is less painful, continuing to drain out pen-rose.  Pt ambulating, having BM's, urinating and tolerating his diet well.    Objective: Vital signs in last 24 hours: Temp:  [97.9 F (36.6 C)-98.3 F (36.8 C)] 98.3 F (36.8 C) (04/02 0520) Pulse Rate:  [80-92] 85 (04/02 0520) Resp:  [18-20] 18 (04/02 0520) BP: (115-122)/(68-75) 117/70 mmHg (04/02 0520) SpO2:  [94 %-100 %] 96 % (04/02 0520) Last BM Date: 01/15/13  Intake/Output from previous day: 04/01 0701 - 04/02 0700 In: 420 [P.O.:420] Out: 1050 [Urine:1050] Intake/Output this shift: Total I/O In: -  Out: 200 [Urine:200]  PE: Gen:  Alert, NAD, pleasant Abd: Soft, NT/ND, +BS, no HSM, incisions C/D/I, drain with minimal sanguinous drainage Left LLQ/groin wound:  Penroses in place draining yellowish drainage, still some induration, minimal erythema, less TTP  Lab Results:   Recent Labs  01/15/13 1201 01/16/13 0610  WBC 11.9* 8.8  HGB 10.1* 9.7*  HCT 30.2* 30.0*  PLT 402* 411*   BMET  Recent Labs  01/15/13 1201 01/16/13 0610  NA 135 137  K 4.0 3.7  CL 103 104  CO2 22 23  GLUCOSE 98 110*  BUN 10 10  CREATININE 0.76 0.97  CALCIUM 8.9 8.8   PT/INR No results found for this basename: LABPROT, INR,  in the last 72 hours CMP     Component Value Date/Time   NA 137 01/16/2013 0610   K 3.7 01/16/2013 0610   CL 104 01/16/2013 0610   CO2 23 01/16/2013 0610   GLUCOSE 110* 01/16/2013 0610   BUN 10 01/16/2013 0610   CREATININE 0.97 01/16/2013 0610   CALCIUM 8.8 01/16/2013 0610   PROT 7.4 12/25/2012 0625   ALBUMIN 3.8 12/25/2012 0625   AST 15 12/25/2012 0625   ALT 17 12/25/2012 0625   ALKPHOS 57 12/25/2012 0625   BILITOT 0.5 12/25/2012 0625   GFRNONAA 85* 01/16/2013 0610   GFRAA >90 01/16/2013 0610   Lipase     Component Value Date/Time   LIPASE 18 02/24/2012 0858       Studies/Results: Ct Abdomen Pelvis Wo Contrast  01/15/2013  *RADIOLOGY REPORT*  Clinical  Data: Recent ventral hernia repair with biologic mesh, now with abdominal wall cellulitis and postoperative fluid collection. The patient is bleeding from the previous lead placed drain site in the left lower quadrant.  CT ABDOMEN AND PELVIS WITHOUT CONTRAST  Technique:  Multidetector CT imaging of the abdomen and pelvis was performed following the standard protocol without intravenous contrast.  Comparison: CT abdomen and pelvis 02/29/2012.  Findings: Fluid collection containing gas bubbles in the anterior abdominal wall subcutaneous tissues, maximum measurements approximating 2.3 x 16.0 x 16.5 cm; the fluid collection is predominately to the right of midline, but does extend slightly to the left of midline as well. There is overlying edema/induration in the subcutaneous fat of the anterior abdominal wall.  The surgical mesh appears intact, and there is no evidence of recurrent abdominal wall hernia.  Normal unenhanced appearance of the liver.  Calcified granuloma in the lateral aspect of the mid spleen, and approximate 1.6 cm focus of accessory splenic tissue inferior to the lower pole of the spleen as noted previously.  Normal unenhanced appearance of the pancreas, gallbladder, and adrenal glands.  No biliary ductal dilation.  Simple cysts involving both kidneys, the largest arising from the left kidney measuring approximately 5.8 x 5.5 cm; within the limits of the  unenhanced technique, no significant focal abnormality involving either kidney.  No urinary tract calculi. Mild to moderate aorto-iliofemoral atherosclerosis without aneurysm.  Lipoma involving the wall of the proximal body of the stomach along its greater curvature, better seen than on the prior examination. Small hiatal hernia, unchanged.  Normal appearing small bowel. Descending and sigmoid colon diverticulosis without evidence of acute diverticulitis; remainder of the colon unremarkable.  Normal appendix in the right upper pelvis.  No ascites.   Urinary bladder decompressed and unremarkable.  No prostatic enlargement.  Calcifications involving the vasa deferens.  Bone window images demonstrate degenerative disc disease at L5-S1 and a Schmorl's node in the lower endplate of T12.  Visualized lung bases clear apart from mild atelectasis.  Bullous emphysematous changes noted in the lung bases.  Heart mildly enlarged with left ventricular predominance.  IMPRESSION:  1.  Large fluid collection in the anterior abdominal wall, containing small gas bubbles.  This is consistent with abscess, infected hematoma, or infected seroma. 2.  Intact surgical mesh without evidence of recurrent anterior abdominal wall hernia. 3.  No acute abnormalities involving the anatomic abdomen or pelvis. 4.  Small hiatal hernia. 5.  Descending and sigmoid colon diverticulosis without evidence of acute diverticulitis. 6.  Small lipoma involving the proximal body of the stomach along the greater curvature.   Original Report Authenticated By: Hulan Saas, M.D.     Anti-infectives: Anti-infectives   Start     Dose/Rate Route Frequency Ordered Stop   01/15/13 1600  vancomycin (VANCOCIN) 1,250 mg in sodium chloride 0.9 % 250 mL IVPB     1,250 mg 166.7 mL/hr over 90 Minutes Intravenous Every 12 hours 01/15/13 1528     01/15/13 1600  piperacillin-tazobactam (ZOSYN) IVPB 3.375 g     3.375 g 12.5 mL/hr over 240 Minutes Intravenous 3 times per day 01/15/13 1528         Assessment/Plan POD #2 s/p I&D abdominal wall abscess -improved 1.  Tolerating diet, having BM's and flatus, ambulating, pain well controlled with orals 2.  Likely be discharged today with Augmentin for 10 days, pain meds, and f/u with Dr Lindie Spruce in 7-10 days 3.  Have nursed teach dressing change over penrose (wadding)    LOS: 2 days    DORT, Deetta Siegmann 01/17/2013, 8:33 AM Pager: [phone]

## 2013-01-18 LAB — CULTURE, ROUTINE-ABSCESS

## 2013-01-24 ENCOUNTER — Encounter (INDEPENDENT_AMBULATORY_CARE_PROVIDER_SITE_OTHER): Payer: Self-pay | Admitting: General Surgery

## 2013-01-24 ENCOUNTER — Ambulatory Visit (INDEPENDENT_AMBULATORY_CARE_PROVIDER_SITE_OTHER): Payer: Medicare Other | Admitting: General Surgery

## 2013-01-24 VITALS — BP 142/82 | HR 103 | Temp 100.5°F | Resp 18 | Ht 75.0 in | Wt 191.6 lb

## 2013-01-24 DIAGNOSIS — A4902 Methicillin resistant Staphylococcus aureus infection, unspecified site: Secondary | ICD-10-CM

## 2013-01-24 MED ORDER — SULFAMETHOXAZOLE-TMP DS 800-160 MG PO TABS: 1.0000 | ORAL_TABLET | Freq: Two times a day (BID) | ORAL | Status: DC

## 2013-01-24 NOTE — Progress Notes (Signed)
The patient comes in today after having had an infection of the central hernia repair site drained in the operating room with 2 large Penrose drains in place.  The cultures from the operation demonstrated methicillin-resistant Staphylococcus aureus. The patient will be started on Septra DS one tablet by mouth twice a day for a period of 2 weeks.  I will see the patient back in on this following Monday. Today his Penrose drains were removed approximately 6 inches on each one. There were resected in place with 3-0 nylon sutures. There was no purulent drainage however a moderate amount of serous drainage was removed. A sterile dressing was applied. I will see the patient back on Monday.

## 2013-01-29 ENCOUNTER — Ambulatory Visit (INDEPENDENT_AMBULATORY_CARE_PROVIDER_SITE_OTHER): Payer: Medicare Other | Admitting: General Surgery

## 2013-01-29 ENCOUNTER — Encounter (INDEPENDENT_AMBULATORY_CARE_PROVIDER_SITE_OTHER): Payer: Self-pay | Admitting: General Surgery

## 2013-01-29 VITALS — BP 160/64 | HR 84 | Temp 98.3°F | Resp 24 | Ht 75.0 in | Wt 192.0 lb

## 2013-01-29 DIAGNOSIS — Z5189 Encounter for other specified aftercare: Secondary | ICD-10-CM

## 2013-01-29 DIAGNOSIS — T888XXD Other specified complications of surgical and medical care, not elsewhere classified, subsequent encounter: Secondary | ICD-10-CM

## 2013-01-29 NOTE — Progress Notes (Signed)
The patient comes in today with less drainage from his Penrose drains.  After anesthetizing the area and cleaning it with Betadine solution I pulled the drain back approximately 6 inches. The excess drain was removed. I then sutured it back in place with 3-0 nylon. I need to see the patient again in approximately one week to reassess his wound and perhaps remove the rest of his drain.

## 2013-02-05 ENCOUNTER — Ambulatory Visit (INDEPENDENT_AMBULATORY_CARE_PROVIDER_SITE_OTHER): Payer: Medicare Other | Admitting: General Surgery

## 2013-02-05 ENCOUNTER — Encounter (INDEPENDENT_AMBULATORY_CARE_PROVIDER_SITE_OTHER): Payer: Self-pay | Admitting: General Surgery

## 2013-02-05 ENCOUNTER — Encounter (INDEPENDENT_AMBULATORY_CARE_PROVIDER_SITE_OTHER): Payer: Self-pay

## 2013-02-05 VITALS — BP 146/74 | HR 76 | Temp 97.7°F | Resp 16 | Ht 75.0 in | Wt 192.8 lb

## 2013-02-05 DIAGNOSIS — Z5189 Encounter for other specified aftercare: Secondary | ICD-10-CM

## 2013-02-05 DIAGNOSIS — T888XXD Other specified complications of surgical and medical care, not elsewhere classified, subsequent encounter: Secondary | ICD-10-CM

## 2013-02-05 MED ORDER — OXYCODONE-ACETAMINOPHEN 5-325 MG PO TABS: 1.0000 | ORAL_TABLET | Freq: Four times a day (QID) | ORAL | Status: DC | PRN

## 2013-02-05 MED ORDER — SULFAMETHOXAZOLE-TMP DS 800-160 MG PO TABS: 1.0000 | ORAL_TABLET | Freq: Two times a day (BID) | ORAL | Status: DC

## 2013-02-05 NOTE — Progress Notes (Signed)
Penrose drains completely removed.  Cleaned the subQ with peroxide and Q-tip.  Covered with triple antibiotic ointment and 4x4.  Will see again in 7-10 days. Will keep on oral antibiotics for MRSA.  Also will give more pain medication.  Marta Lamas. [name], MD, FACS [phone] [facility] Surgery

## 2013-02-12 ENCOUNTER — Ambulatory Visit (INDEPENDENT_AMBULATORY_CARE_PROVIDER_SITE_OTHER): Payer: Medicare Other | Admitting: General Surgery

## 2013-02-12 ENCOUNTER — Encounter (INDEPENDENT_AMBULATORY_CARE_PROVIDER_SITE_OTHER): Payer: Self-pay | Admitting: General Surgery

## 2013-02-12 VITALS — BP 142/70 | HR 96 | Temp 98.4°F | Resp 18 | Ht 75.0 in | Wt 194.6 lb

## 2013-02-12 DIAGNOSIS — Z09 Encounter for follow-up examination after completed treatment for conditions other than malignant neoplasm: Secondary | ICD-10-CM | POA: Insufficient documentation

## 2013-02-12 NOTE — Progress Notes (Signed)
The patient comes in today feeling very well. He's had no fevers and chills since his last drain was removed. His abdomen is nontender. He did remark that he woke up and sweats this morning but does not recall having had a fever.  On examination there is some mild bogginess of the lower part of his incision and his abdominal wound. There is no drainage from the drain site. The patient remarks that he has not had any drainage from the site since 2 days after I removed the last Penrose drain. Michael Ellison is doing extremely well. He is to return to see me on a when necessary basis. He knows that if he should have fevers, chills, or any significant abdominal symptoms he should contact my office.

## 2013-02-21 ENCOUNTER — Telehealth: Payer: Self-pay | Admitting: Emergency Medicine

## 2013-02-21 NOTE — Telephone Encounter (Signed)
Dr. Delton Coombes please advise if you would do an excusal for pt. thanks

## 2013-02-23 NOTE — Telephone Encounter (Signed)
Pt's daughter called back re: status os this. Michael Ellison

## 2013-02-23 NOTE — Telephone Encounter (Signed)
We can't just write a letter, we need the actual summons in our office. He needs to bring it if he wants [place] to try to get him excused.

## 2013-02-23 NOTE — Telephone Encounter (Signed)
Spoke with Gabriel Rung and she is aware to bring the summons She will drop it by either this afternoon or first thing in the am Monday 5/12 Will forward to Lauren so she can keep an eye out for it, thanks

## 2013-02-26 ENCOUNTER — Encounter: Payer: Self-pay | Admitting: *Deleted

## 2013-02-26 NOTE — Telephone Encounter (Signed)
Pt daughter left letter for RB to fill out, for jury duty. She wants it faxed and please call her when done.

## 2013-02-26 NOTE — Telephone Encounter (Signed)
Also according to the form, it needs to be RECEIVED by the jury service by TODAY May 12,2014

## 2013-02-26 NOTE — Telephone Encounter (Signed)
I have letter, however patient needs to sign letter before I can send this off I have ATC Monique to inform her of this, no answer [facility]

## 2013-02-27 NOTE — Telephone Encounter (Signed)
LMOM TCB x1 Jury summons have to be received by the [place] of Court 10 days PRIOR TO service date [employer] be faxed, must be MAILED

## 2013-02-28 NOTE — Telephone Encounter (Signed)
[email or web address] for [employer], spoke with pt. Per the [employer] from [place]:  This form MUST be received by Monday, Feb 26, 2013 .  It provides a mailing address and a fax #.  Pt will have to put his explanation on the summons and sign the summons.  We will then have to attach the doctor's statement to the summons and send it back.  Unsure if they will accept it now as it is past the deadline.  Pt is aware the summons has been placed at front so he can complete the required sections and is to call the Court at the # provided on the summons - [phone] - to see if this will still be accepted considering it is after the deadline.  He is to call us back to let us know the outcome.  Summons placed at front for pick up by pt's daughter per his request.  Will await pt's call back regarding the outcome.

## 2013-03-05 NOTE — Telephone Encounter (Signed)
Will close note as today is the date patient is to appear in court for jury duty and pts summons is still upfront.

## 2013-03-16 ENCOUNTER — Ambulatory Visit: Payer: Medicare Other | Admitting: Emergency Medicine

## 2013-06-22 IMAGING — CT CT CHEST W/O CM
2 of 4 series · 16 of 46 positions shown, 18 images · IV contrast (APPLIED)
Comparison: CT abdomen and pelvis 02/24/2012.

CT CHEST

CLINICAL DATA: Post bowel resection for bowel obstruction.
Aspiration.  Evaluate for leak.  Evaluate infiltrates.  The

CT CHEST, ABDOMEN AND PELVIS WITHOUT CONTRAST
TECHNIQUE: Multidetector CT imaging of the chest, abdomen and
pelvis was performed following the standard protocol without IV
contrast.

[Series 2: c/a/p 5.0 b31f · axial · 0.80mm/px · z∈[-507,+93]mm · 13 of 134 slices shown, 15 images]
[im 7/134  soft-tissue]
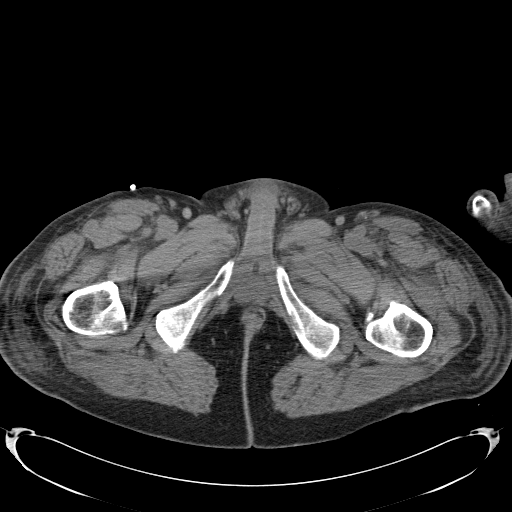
[im 7/134  bone]
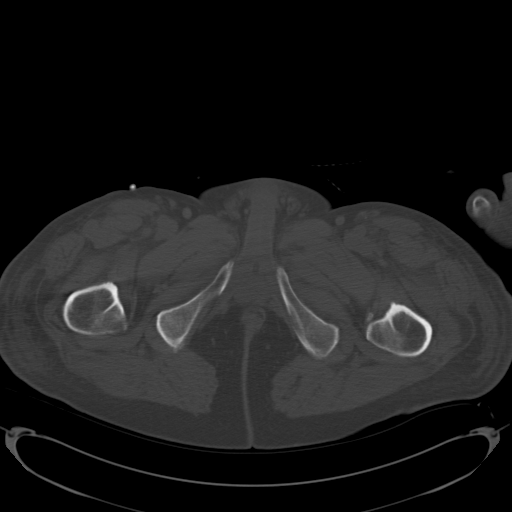
[im 19/134  soft-tissue]
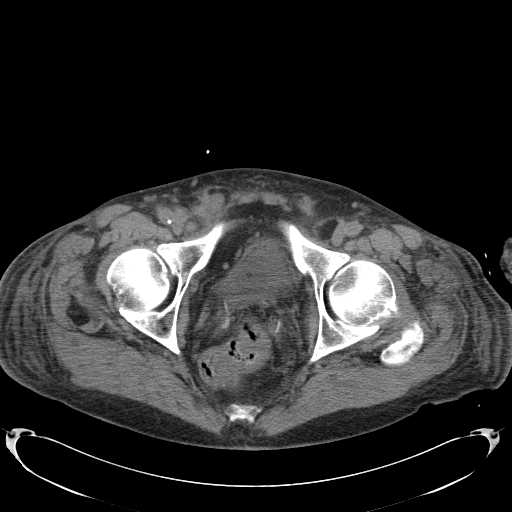
[im 31/134  soft-tissue]
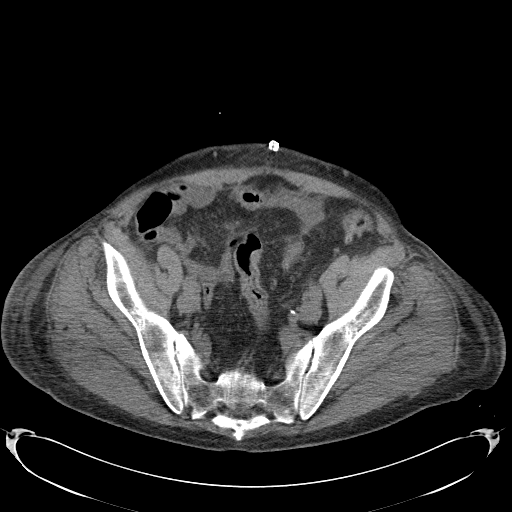
[im 37/134  soft-tissue]
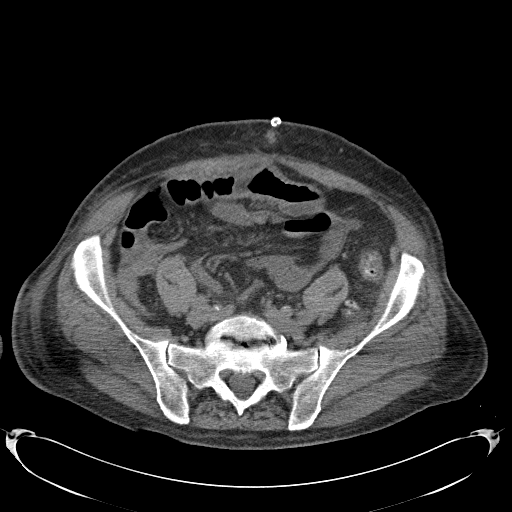
[im 49/134  soft-tissue]
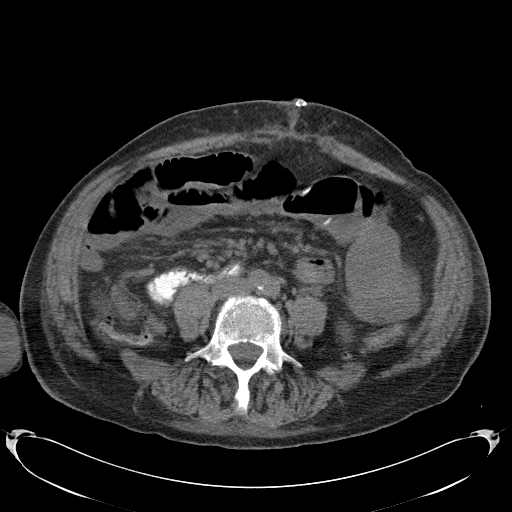
[im 55/134  soft-tissue]
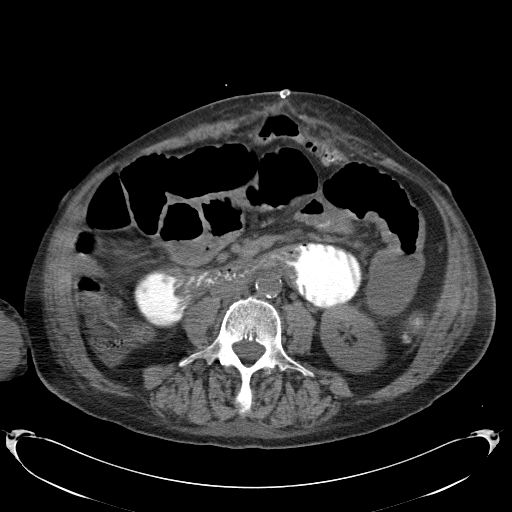
[im 67/134  soft-tissue]
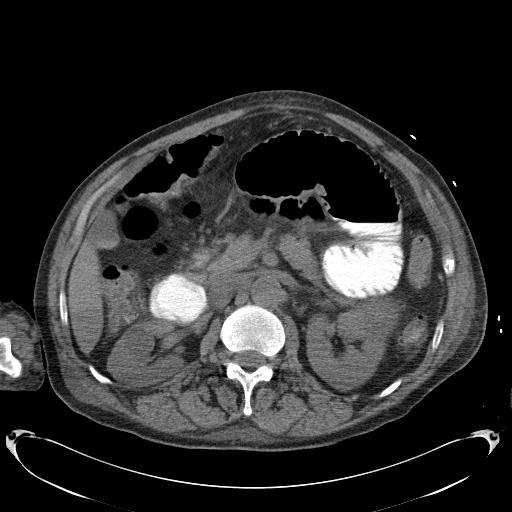
[im 79/134  soft-tissue]
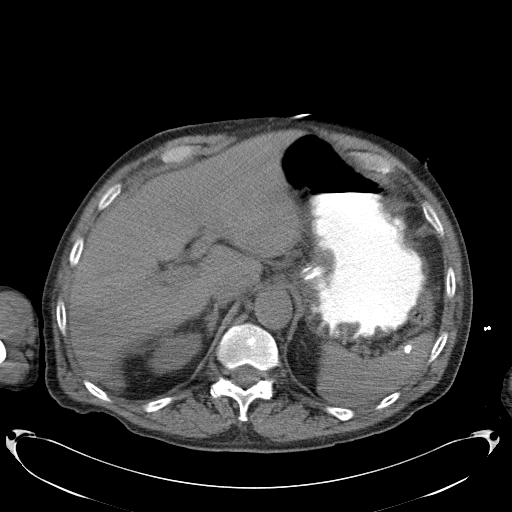
[im 85/134  soft-tissue]
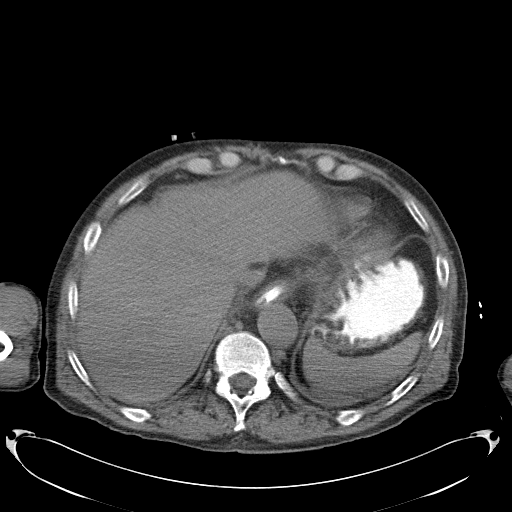
[im 85/134  bone]
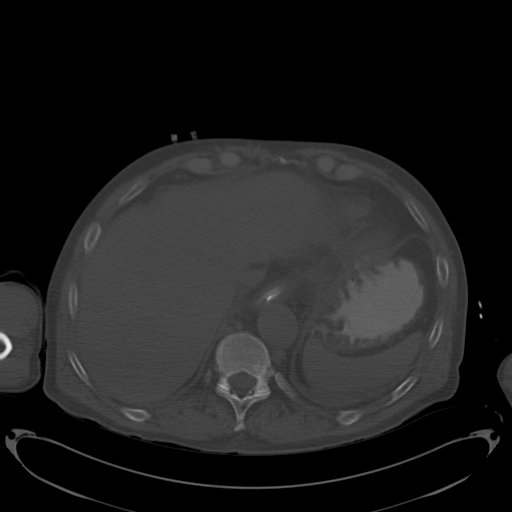
[im 97/134  soft-tissue]
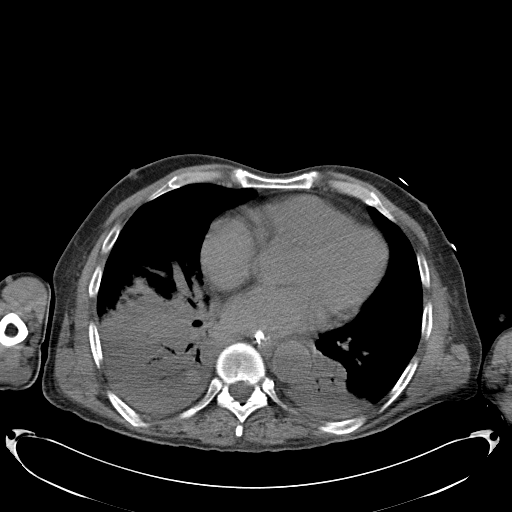
[im 103/134  soft-tissue]
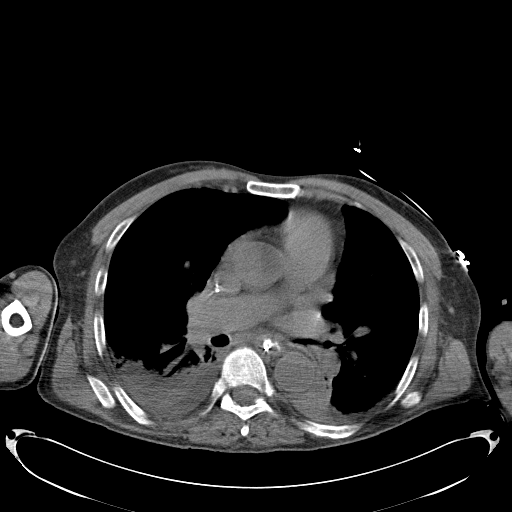
[im 115/134  soft-tissue]
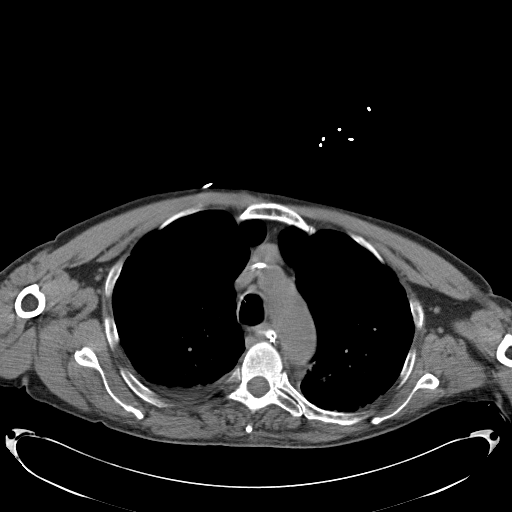
[im 127/134  soft-tissue]
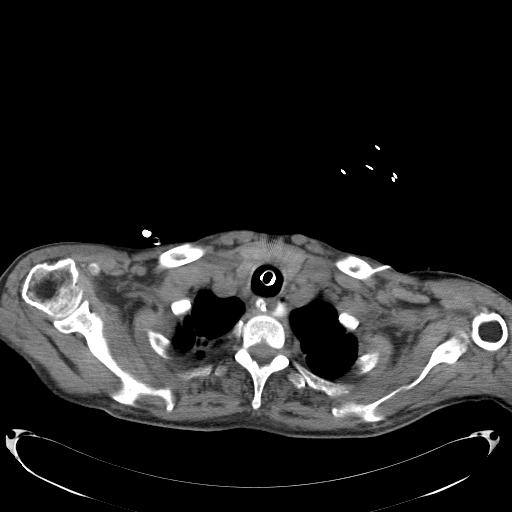

[Series 5: coronals · coronal · 1.87mm/px · 3 of 90 slices shown]
[im 30/90  soft-tissue]
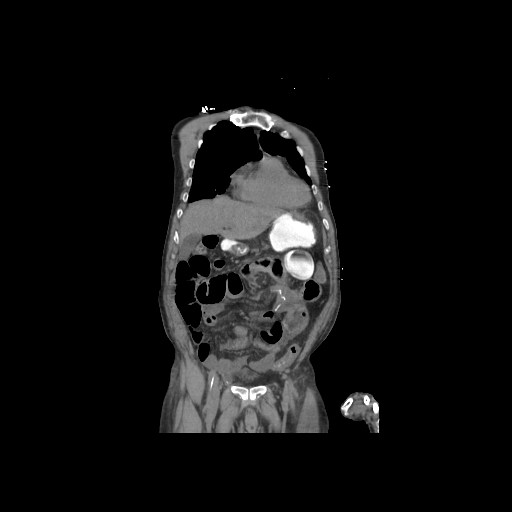
[im 40/90  soft-tissue]
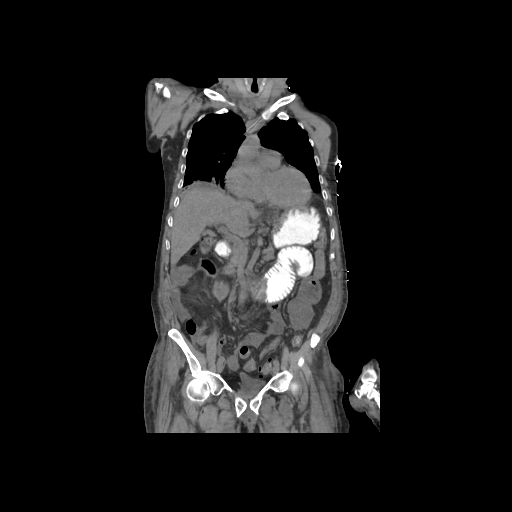
[im 50/90  soft-tissue]
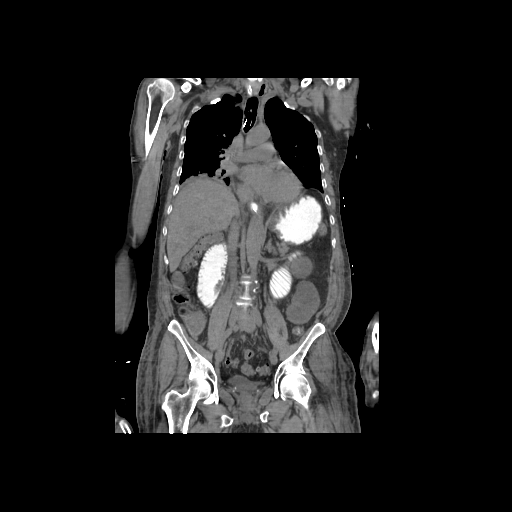

[16 of 46 positions shown; findings below may reference images not displayed]

FINDINGS: Endotracheal and enteric tubes are in place.  Left
central venous catheter with tip in the SVC.  Normal caliber
thoracic aorta.  Prominent central pulmonary vessels suggesting
congestion arterial hypertension. Small bilateral pleural effusions
with basilar atelectasis or consolidation bilaterally.  Extensive
emphysematous changes in the lungs with bullous changes in both
apices.  Scarring in the apices bilaterally.  Small ground-glass
nodule in the right middle lung measuring about 4 mm diameter may
represent infiltrative process.  No pneumothorax.  Cardiac
enlargement.  No significant lymphadenopathy in the chest.
Degenerative changes in the thoracic spine.  Schmorl's node at T12.
IMPRESSION: Cardiac enlargement with pulmonary vascular congestion.  Bilateral
pleural effusions with basilar atelectasis or consolidation.  Small
ground-glass nodule in the right mid lung.

CT ABDOMEN AND PELVIS
FINDINGS: Mild persistent distension of the stomach, duodenum, and
proximal jejunum.  Distal bowel are decompressed.  Residual
obstruction is not excluded although changes could represent ileus.
Transition zone appears to be distal to the region of the
anastomoses. Nonspecific wall thickening and seven mid abdominal
jejunal loops.  Contrast material extends to the region of the
proximal jejunum.  No distal contrast material.  No evidence of
contrast extravasation.  No significant free fluid.  No free intra-
abdominal air.  Diffusely increased density throughout the
subcutaneous and intra-abdominal fat consistent with edema.

The unenhanced appearance of the liver, spleen, gallbladder,
pancreas, and adrenal glands is unremarkable.  Calcification of
abdominal aorta without aneurysm.  Low attenuation lesions in the
kidneys consistent with cysts as demonstrated on the previous
study.  No retroperitoneal lymphadenopathy.

Pelvis:  Central venous catheter in the right femoral vein.  Small
amount of free fluid in the pelvis.  No free air.  No loculated
fluid.  The colon is decompressed.  Diverticula are present in the
sigmoid region without inflammatory change.  Appendix is not well
visualized.  The degenerative changes in the spine.
IMPRESSION: Residual dilatation of stomach and proximal small bowel with
decompressed distal small bowel and colon.  Changes could represent
ileus or residual obstruction.  Dilatation extends distal to the
area of the anastomoses.  Nonspecific wall thickening of jejunal
loops may represent edema.  No free air or free fluid to suggest
perforation.

## 2013-06-28 ENCOUNTER — Ambulatory Visit: Payer: Medicare Other | Admitting: Emergency Medicine

## 2013-07-13 ENCOUNTER — Encounter: Payer: Self-pay | Admitting: Emergency Medicine

## 2013-07-13 ENCOUNTER — Ambulatory Visit (INDEPENDENT_AMBULATORY_CARE_PROVIDER_SITE_OTHER): Payer: Medicare Other | Admitting: Emergency Medicine

## 2013-07-13 VITALS — BP 150/90 | HR 87 | Temp 97.6°F | Ht 75.0 in | Wt 213.4 lb

## 2013-07-13 DIAGNOSIS — Z23 Encounter for immunization: Secondary | ICD-10-CM

## 2013-07-13 DIAGNOSIS — J449 Chronic obstructive pulmonary disease, unspecified: Secondary | ICD-10-CM

## 2013-07-13 MED ORDER — AZITHROMYCIN 250 MG PO TABS: 250.0000 mg | ORAL_TABLET | Freq: Every day | ORAL | Status: DC

## 2013-07-13 MED ORDER — PREDNISONE 10 MG PO TABS: ORAL_TABLET | ORAL | Status: DC

## 2013-07-13 NOTE — Progress Notes (Signed)
  Subjective:    Patient ID: Michael Ellison, male    DOB: 1945-11-08, 67 y.o.   MRN: [id number]  HPI 67 y.o. y/o male, smoker, with a PMH of ETOH, HTN, Prostate Cancer admitted to [facility] 02/24/2012 with nausea / vomiting and abdominal pain secondary to small bowel obstruction due to adhesions. Underwent exploratory laparotomy with small bowel resection 5/10. Developed delirium post-op. Transferred to [facility] 5/14 for respiratory failure requiring intubation. Developed abdominal fascial dehiscence and taken back to OR 5/16. Failed extubation, and required tracheostomy 5/22. Weaned to [facility] 6/21. He was noted to have dysphagia and was evaluated by Speech Therapy and found to have a large Zenker diverticulum. He is status post Endoscopic Zenker diverticulectomy 7/2. Swallowing / diet was advanced post procedure. He was decanulated 8/7, PEG tube out 8/1. Presents now for eval of his COPD.   He was d/c to home on albuterol nebs q6h and pulmicort nebs q12h. Tells me that he is having some exertional SOB. He does not have any O2 at this time.   ROV 08/10/12 -- Complicated hospitalization as above, presents back today for COPD. PFT today with moderately severe AFL, restriction on volumes, decreased DLCO that corrects for Va. Hypoxemia identified last week - started O2. On albuterol prn + pulmicort. He does still have DOE. Wearing his O2 reliably.   ROV 10/12/12 -- COPD with moderately severe AFL. He is only using his O2 prn. He is on Spiriva, started 10/13. Also doing pulmicort + albuterol. He can't tell much difference, but he is less congested, having less cough. He uses albuterol about 2x a day.  He is active, walks every morning.   ROV 07/13/13 -- COPD, hypoxemia. On spiriva + pulmicort/albuterol has been using prn. Returns today for f/u. He is wearing O2 more reliably, mainly because he has been having more trouble for last 3-4 weeks. He believes he has been worse since his hernia surgery. He is coughing, white  mucous. He is dyspneic especially at night when supine.      Objective:   Physical Exam Filed Vitals:   07/13/13 1116  BP: 150/90  Pulse: 87  Temp: 97.6 F (36.4 C)   Gen: Pleasant, thin, in no distress,  normal affect  ENT: No lesions,  mouth clear,  oropharynx clear, no postnasal drip  Neck: No JVD, no TMG, no carotid bruits, trach site closing. Stoma has closed, looks good  Lungs: No use of accessory muscles, B diffuse exp wheezing  Cardiovascular: RRR, heart sounds normal, no murmur or gallops, no peripheral edema  Musculoskeletal: No deformities, no cyanosis or clubbing  Neuro: alert, non focal  Skin: Warm, no lesions or rashes     Assessment & Plan:  COPD (chronic obstructive pulmonary disease) Probably not well controlled chronically, also he now appears to have an AE.  - treat AE with pred + azithro - stop budesonide - add breo to spiriva - O2 w exertion - rov 1 - flu shot

## 2013-07-13 NOTE — Assessment & Plan Note (Signed)
Probably not well controlled chronically, also he now appears to have an AE.  - treat AE with pred + azithro - stop budesonide - add breo to spiriva - O2 w exertion - rov 1 - flu shot

## 2013-07-13 NOTE — Patient Instructions (Addendum)
Flu shot today Take prednisone and azithromycin as directed STOP pulmicort (bedesonide) nebulizer treatments Continue your Spiriva daily We will start Breo one inhalation daily for a month to see if you benefit You may still use your albuterol nebulizer as needed for shortness of breath.  Follow with Dr Delton Coombes in 1 month

## 2013-08-07 ENCOUNTER — Emergency Department (HOSPITAL_COMMUNITY)
Admission: EM | Admit: 2013-08-07 | Discharge: 2013-08-07 | Disposition: A | Discharge status: Home / Self Care | Payer: Medicare Other | Attending: Emergency Medicine | Admitting: Emergency Medicine

## 2013-08-07 ENCOUNTER — Encounter (HOSPITAL_COMMUNITY): Payer: Self-pay | Admitting: Emergency Medicine

## 2013-08-07 DIAGNOSIS — Z79899 Other long term (current) drug therapy: Secondary | ICD-10-CM | POA: Insufficient documentation

## 2013-08-07 DIAGNOSIS — Z792 Long term (current) use of antibiotics: Secondary | ICD-10-CM | POA: Insufficient documentation

## 2013-08-07 DIAGNOSIS — S61451A Open bite of right hand, initial encounter: Secondary | ICD-10-CM

## 2013-08-07 DIAGNOSIS — F329 Major depressive disorder, single episode, unspecified: Secondary | ICD-10-CM | POA: Insufficient documentation

## 2013-08-07 DIAGNOSIS — F3289 Other specified depressive episodes: Secondary | ICD-10-CM | POA: Insufficient documentation

## 2013-08-07 DIAGNOSIS — Z87891 Personal history of nicotine dependence: Secondary | ICD-10-CM | POA: Insufficient documentation

## 2013-08-07 DIAGNOSIS — R109 Unspecified abdominal pain: Secondary | ICD-10-CM | POA: Insufficient documentation

## 2013-08-07 DIAGNOSIS — Y9289 Other specified places as the place of occurrence of the external cause: Secondary | ICD-10-CM | POA: Insufficient documentation

## 2013-08-07 DIAGNOSIS — S61409A Unspecified open wound of unspecified hand, initial encounter: Secondary | ICD-10-CM | POA: Insufficient documentation

## 2013-08-07 DIAGNOSIS — Z8546 Personal history of malignant neoplasm of prostate: Secondary | ICD-10-CM | POA: Insufficient documentation

## 2013-08-07 DIAGNOSIS — W540XXA Bitten by dog, initial encounter: Secondary | ICD-10-CM | POA: Insufficient documentation

## 2013-08-07 DIAGNOSIS — I10 Essential (primary) hypertension: Secondary | ICD-10-CM | POA: Insufficient documentation

## 2013-08-07 DIAGNOSIS — J441 Chronic obstructive pulmonary disease with (acute) exacerbation: Secondary | ICD-10-CM | POA: Insufficient documentation

## 2013-08-07 DIAGNOSIS — Y939 Activity, unspecified: Secondary | ICD-10-CM | POA: Insufficient documentation

## 2013-08-07 DIAGNOSIS — Z23 Encounter for immunization: Secondary | ICD-10-CM | POA: Insufficient documentation

## 2013-08-07 MED ORDER — TETANUS-DIPHTH-ACELL PERTUSSIS 5-2.5-18.5 LF-MCG/0.5 IM SUSP
0.5000 mL | Freq: Once | INTRAMUSCULAR | Status: AC
  Administered 2013-08-07: 0.5 mL via INTRAMUSCULAR
  Filled 2013-08-07: qty 0.5

## 2013-08-07 MED ORDER — AMOXICILLIN-POT CLAVULANATE 875-125 MG PO TABS: 1.0000 | ORAL_TABLET | Freq: Two times a day (BID) | ORAL | Status: DC

## 2013-08-07 NOTE — ED Provider Notes (Signed)
Medical screening examination/treatment/procedure(s) were performed by non-physician practitioner and as supervising physician I was immediately available for consultation/collaboration.   Layla Maw Alphonsa Brickle, DO 08/07/13 1540

## 2013-08-07 NOTE — ED Provider Notes (Signed)
CSN: [id number]     Arrival date & time 08/07/13  1030 History   First MD Initiated Contact with Patient 08/07/13 1046     Chief Complaint  Patient presents with  . Animal Bite    dog bite right hand   (Consider location/radiation/quality/duration/timing/severity/associated sxs/prior Treatment) Patient is a 67 y.o. male presenting with animal bite. The history is provided by the patient.  Animal Bite Contact animal:  Dog Location:  Hand Hand injury location:  R hand Time since incident:  2 hours Pain details:    Quality:  Aching   Severity:  Mild   Progression:  Unchanged Incident location:  Another residence Provoked: provoked   Notifications:  Animal control Animal's rabies vaccination status:  Unknown Animal in possession: yes   Tetanus status:  Out of date Relieved by:  Nothing Ineffective treatments:  None tried Associated symptoms: no numbness and no swelling     Past Medical History  Diagnosis Date  . Hypertension   . Cancer   . Cancer of prostate   . Depression   . Asthma   . Shortness of breath   . COPD (chronic obstructive pulmonary disease)     home O2  . Aspiration pneumonia 03/01/2012   Past Surgical History  Procedure Laterality Date  . Hernia repair    . Prostate biopsy    . Robotic prostate surgery  2011  . Laparotomy  02/25/2012    Procedure: EXPLORATORY LAPAROTOMY;  Surgeon: [name], MD;  Location: [facility] ORS;  Service: General;  Laterality: N/A;  . Bowel resection  02/25/2012    Procedure: SMALL BOWEL RESECTION;  Surgeon: [name], MD;  Location: [facility];  Service: General;;  . Wound debridement  03/02/2012    Procedure: DEBRIDEMENT CLOSURE/ABDOMINAL WOUND;  Surgeon: Cherylynn Ridges, MD;  Location: [facility] OR;  Service: General;  Laterality: N/A;  . Tracheostomy  03/2012  . Ventral hernia repair  12/25/2012     Dr Lindie Spruce  . Ventral hernia repair N/A 12/25/2012    Procedure: HERNIA REPAIR VENTRAL ADULT;  Surgeon: Cherylynn Ridges, MD;  Location: [facility]  OR;  Service: General;  Laterality: N/A;  . Insertion of mesh N/A 12/25/2012    Procedure: INSERTION OF MESH;  Surgeon: Cherylynn Ridges, MD;  Location: [facility] OR;  Service: General;  Laterality: N/A;  . Incision and drainage abscess N/A 01/15/2013    Procedure: INCISION AND DRAINAGE Abdominal Wall ABSCESS;  Surgeon: Cherylynn Ridges, MD;  Location: [facility] OR;  Service: General;  Laterality: N/A;   Family History  Problem Relation Age of Onset  . Pseudochol deficiency Neg Hx   . Anesthesia problems Neg Hx   . Hypotension Neg Hx   . Malignant hyperthermia Neg Hx    History  Substance Use Topics  . Smoking status: Former Smoker -- 1.50 packs/day for 44 years    Types: Cigarettes    Quit date: 02/22/2012  . Smokeless tobacco: Never Used  . Alcohol Use: No     Comment: occasional beer 1-2    Review of Systems  Constitutional: Negative for activity change.       All ROS Neg except as noted in HPI  HENT: Negative for nosebleeds.   Eyes: Negative for photophobia and discharge.  Respiratory: Positive for wheezing. Negative for cough and shortness of breath.   Cardiovascular: Negative for chest pain and palpitations.  Gastrointestinal: Positive for abdominal pain. Negative for blood in stool.  Genitourinary: Negative for dysuria, frequency and hematuria.  Musculoskeletal: Negative for arthralgias, back pain and neck pain.  Skin: Negative.   Neurological: Negative for dizziness, seizures, speech difficulty and numbness.  Psychiatric/Behavioral: Negative for hallucinations and confusion.       Depression    Allergies  Lorazepam  Home Medications   Current Outpatient Rx  Name  Route  Sig  Dispense  Refill  . albuterol (PROVENTIL HFA;VENTOLIN HFA) 108 (90 BASE) MCG/ACT inhaler   Inhalation   Inhale 2 puffs into the lungs every 6 (six) hours as needed for shortness of breath.         . EXPIRED: albuterol (PROVENTIL) (5 MG/ML) 0.5% nebulizer solution   Nebulization   Take 2.5 mg by  nebulization every 6 (six) hours. AND Q3 PRN SOB or Wheezing         . amLODipine (NORVASC) 5 MG tablet   Oral   Take 5 mg by mouth daily.         Marland Kitchen amoxicillin-clavulanate (AUGMENTIN) 875-125 MG per tablet   Oral   Take 1 tablet by mouth 2 (two) times daily.   10 tablet   0   . azithromycin (ZITHROMAX) 250 MG tablet   Oral   Take 1 tablet (250 mg total) by mouth daily.   6 tablet   0     Take 2 on first day and then 1 qd until gone   . EXPIRED: budesonide (PULMICORT) 0.5 MG/2ML nebulizer solution   Nebulization   Take 0.5 mg by nebulization 2 (two) times daily.         Marland Kitchen escitalopram (LEXAPRO) 10 MG tablet   Oral   Take 10 mg by mouth daily.         . furosemide (LASIX) 40 MG tablet   Oral   Take 40 mg by mouth daily.         Marland Kitchen HYDROcodone-acetaminophen (NORCO/VICODIN) 5-325 MG per tablet               . predniSONE (DELTASONE) 10 MG tablet      Take 40mg  daily for 3 days, then 30mg  daily for 3 days, then 20mg  daily for 3 days, then 10mg  daily for 3 days, then stop   30 tablet   0    BP 156/108  Pulse 93  Temp(Src) 98.3 F (36.8 C) (Oral)  Resp 16  Ht 6\' 3"  (1.905 m)  Wt 215 lb (97.523 kg)  BMI 26.87 kg/m2  SpO2 97% Physical Exam  Nursing note and vitals reviewed. Constitutional: He is oriented to person, place, and time. He appears well-developed and well-nourished.  Non-toxic appearance.  HENT:  Head: Normocephalic.  Right Ear: Tympanic membrane and external ear normal.  Left Ear: Tympanic membrane and external ear normal.  Eyes: EOM and lids are normal. Pupils are equal, round, and reactive to light.  Neck: Normal range of motion. Neck supple. Carotid bruit is not present.  Cardiovascular: Normal rate, regular rhythm, normal heart sounds, intact distal pulses and normal pulses.   Pulmonary/Chest: Breath sounds normal. No respiratory distress.  Abdominal: Soft. Bowel sounds are normal. There is no tenderness. There is no guarding.   Musculoskeletal: Normal range of motion.  2 bruised areas at the base of the right thumb. FROM of the fingers of the right hand. Cap refill less than 2 sec. No hot areas.   Lymphadenopathy:       Head (right side): No submandibular adenopathy present.       Head (left side): No submandibular adenopathy present.  He has no cervical adenopathy.  Neurological: He is alert and oriented to person, place, and time. He has normal strength. No cranial nerve deficit or sensory deficit. He exhibits normal muscle tone. Coordination normal.  Skin: Skin is warm and dry.  Psychiatric: He has a normal mood and affect. His speech is normal.    ED Course  Procedures (including critical care time) Labs Review Labs Reviewed - No data to display Imaging Review No results found.  EKG Interpretation   None       MDM   1. Animal bite of hand, right, initial encounter    *I have reviewed nursing notes, vital signs, and all appropriate lab and imaging results for this patient.**  Pt states animal control was at his mother's home were the dog lives. The report has been submitted. Pt's tetanus up dated. Rx for augmentin given to the patient.Kathie Dike, PA-C 08/07/13 1110

## 2013-08-07 NOTE — ED Notes (Signed)
Patient to ED c/o dog bite on the right hand. States it is his neighbors dog and patient is unsure of whether it is up to date on shots. 2 small spots where dog bit on right hand near the base of patient's thumb, slight redness. No bleeding at the site. Patient has full movement of hand. No swelling present at this time. Patient in no apparent distress. Alert and oriented X4.

## 2013-08-07 NOTE — ED Notes (Signed)
Animal control at bedside to speak with pt

## 2013-08-22 ENCOUNTER — Ambulatory Visit: Payer: Medicare Other | Admitting: Emergency Medicine

## 2013-09-25 ENCOUNTER — Ambulatory Visit: Payer: Medicare Other | Admitting: Emergency Medicine

## 2013-10-23 ENCOUNTER — Ambulatory Visit: Payer: Medicare Other | Admitting: Emergency Medicine

## 2013-11-28 ENCOUNTER — Ambulatory Visit: Payer: Medicare Other | Admitting: Emergency Medicine

## 2013-12-19 ENCOUNTER — Telehealth: Payer: Self-pay | Admitting: Emergency Medicine

## 2013-12-19 MED ORDER — PREDNISONE 10 MG PO TABS: ORAL_TABLET | ORAL | Status: DC

## 2013-12-19 NOTE — Telephone Encounter (Signed)
Spoke with pt. Reports SOB x1 week. Chest tightness/heaviness and cough are present. Mucus is white in color. Has oxygen at home, uses 2.5 L/min. This helps very little. Is using nebulizer and albuterol inhaler with minimal relief. Would like something called in. Offered appointment but declined.  RB - please advise. Thanks.

## 2013-12-19 NOTE — Telephone Encounter (Signed)
Pt aware per RB that pred sent into pharm and needs to make appointment. Pt will call to schedule nothing further needed

## 2013-12-19 NOTE — Telephone Encounter (Signed)
Give a pred taper: Take 40mg  daily for 3 days, then 30mg  daily for 3 days, then 20mg  daily for 3 days, then 10mg  daily for 3 days, then stop He needs to seen -

## 2013-12-21 ENCOUNTER — Ambulatory Visit: Payer: Medicare Other | Admitting: Emergency Medicine

## 2014-01-17 ENCOUNTER — Other Ambulatory Visit: Payer: Self-pay | Admitting: Emergency Medicine

## 2014-03-06 ENCOUNTER — Encounter: Payer: Self-pay | Admitting: Emergency Medicine

## 2014-03-06 ENCOUNTER — Ambulatory Visit (INDEPENDENT_AMBULATORY_CARE_PROVIDER_SITE_OTHER): Payer: Medicare Other | Admitting: Emergency Medicine

## 2014-03-06 VITALS — BP 142/94 | HR 84 | Ht 75.0 in | Wt 223.0 lb

## 2014-03-06 DIAGNOSIS — J449 Chronic obstructive pulmonary disease, unspecified: Secondary | ICD-10-CM

## 2014-03-06 NOTE — Patient Instructions (Signed)
We will start Anoro once a day Please use your albuterol nebulizer up to 4 times a day IF NEEDED for shortness of breath Wear your oxygen at all times Follow with Dr Lamonte Sakai in 1 month

## 2014-03-06 NOTE — Assessment & Plan Note (Signed)
We will start Anoro qd to see if he benefits.  Will continue albuterol nebs prn.  Continue O2 at all times rov1

## 2014-03-06 NOTE — Progress Notes (Signed)
  Subjective:    Patient ID: Michael Ellison, male    DOB: 1946-09-06, 68 y.o.   MRN: [id number]  HPI 68 y.o. y/o male, smoker, with a PMH of ETOH, HTN, Prostate Cancer admitted to [facility] 02/24/2012 with nausea / vomiting and abdominal pain secondary to small bowel obstruction due to adhesions. Underwent exploratory laparotomy with small bowel resection 5/10. Developed delirium post-op. Transferred to [facility] 5/14 for respiratory failure requiring intubation. Developed abdominal fascial dehiscence and taken back to OR 5/16. Failed extubation, and required tracheostomy 5/22. Weaned to [facility] 6/21. He was noted to have dysphagia and was evaluated by Speech Therapy and found to have a large Zenker diverticulum. He is status post Endoscopic Zenker diverticulectomy 7/2. Swallowing / diet was advanced post procedure. He was decanulated 8/7, PEG tube out 8/1. Presents now for eval of his COPD.   He was d/c to home on albuterol nebs q6h and pulmicort nebs q12h. Tells me that he is having some exertional SOB. He does not have any O2 at this time.   ROV 67/24/26 -- Complicated hospitalization as above, presents back today for COPD. PFT today with moderately severe AFL, restriction on volumes, decreased DLCO that corrects for Va. Hypoxemia identified last week - started O2. On albuterol prn + pulmicort. He does still have DOE. Wearing his O2 reliably.   ROV 10/12/12 -- COPD with moderately severe AFL. He is only using his O2 prn. He is on Spiriva, started 10/13. Also doing pulmicort + albuterol. He can't tell much difference, but he is less congested, having less cough. He uses albuterol about 2x a day.  He is active, walks every morning.   ROV 07/13/13 -- COPD, hypoxemia. On spiriva + pulmicort/albuterol has been using prn. Returns today for f/u. He is wearing O2 more reliably, mainly because he has been having more trouble for last 3-4 weeks. He believes he has been worse since his hernia surgery. He is coughing, white  mucous. He is dyspneic especially at night when supine.   ROV 03/06/14 -- returns for COPD, hypoxemia. Last time we treated for an acute flare, added Breo to spirva stopped pulmicort. He ran out of [place] after the samplesran out, Spiriva sometime before March.  Reports more dyspnea. He was just prescribed duonebs by his PCP to take qid. He hasn't smoked since 3 yrs ago.      Objective:   Physical Exam Filed Vitals:   03/06/14 1529  BP: 142/94  Pulse: 84  Height: 6\' 3"  (1.905 m)  Weight: 223 lb (101.152 kg)  SpO2: 98%   Gen: Pleasant, thin, in no distress,  normal affect  ENT: No lesions,  mouth clear,  oropharynx clear, no postnasal drip  Neck: No JVD, no TMG, no carotid bruits, trach site closing. Stoma has closed, looks good  Lungs: No use of accessory muscles, B diffuse exp wheezing  Cardiovascular: RRR, heart sounds normal, no murmur or gallops, no peripheral edema  Musculoskeletal: No deformities, no cyanosis or clubbing  Neuro: alert, non focal  Skin: Warm, no lesions or rashes     Assessment & Plan:  COPD (chronic obstructive pulmonary disease) We will start Anoro qd to see if he benefits.  Will continue albuterol nebs prn.  Continue O2 at all times rov1

## 2014-03-19 ENCOUNTER — Encounter (INDEPENDENT_AMBULATORY_CARE_PROVIDER_SITE_OTHER): Payer: Self-pay | Admitting: General Surgery

## 2014-03-19 ENCOUNTER — Ambulatory Visit (INDEPENDENT_AMBULATORY_CARE_PROVIDER_SITE_OTHER): Payer: Medicare Other | Admitting: General Surgery

## 2014-03-19 VITALS — BP 148/90 | HR 76 | Resp 18 | Ht 75.0 in | Wt 209.4 lb

## 2014-03-19 DIAGNOSIS — K432 Incisional hernia without obstruction or gangrene: Secondary | ICD-10-CM | POA: Insufficient documentation

## 2014-03-19 NOTE — Progress Notes (Signed)
Subjective:     Patient ID: Michael Ellison, male   DOB: 1946/10/08, 68 y.o.   MRN: [id number]  HPI The patient is having some difficulty with his bowels. He also has noticed some abdominal swelling.  Review of Systems Constipation. Some abdominal discomfort from the increased swelling.    Objective:   Physical Exam The patient has recurrent hernias along his midline incision. One is at the superior aspect of the incision. The several other ones in the central to lower portion of the incision. These are very soft and easily reducible.  The patient currently is nontender on examination has good bowel sounds. His difficulty having bowel movements it is probably not related to the hernias.    Assessment:     Recurrent hernias in a patient who is septic from a previous wound infection after a laparotomy at an outside institution. He subsequently underwent a ventral hernia apparent with biologic prosthesis. This one got infected and had to be drained. He now had what appears to be a recurrent hernia.     Plan:     The patient is a high-risk candidate for surgery and therefore we will watch the dramatically over the next several months. If he should become more symptomatic and certainly if he should become incarcerated and surgery may be indicated. However the patient has severe COPD and is on home oxygen and would not represent a good candidate for surgery. I will see him back in 6 months.

## 2014-03-29 ENCOUNTER — Encounter: Payer: Self-pay | Admitting: Emergency Medicine

## 2014-03-29 ENCOUNTER — Telehealth: Payer: Self-pay | Admitting: *Deleted

## 2014-03-29 ENCOUNTER — Ambulatory Visit (INDEPENDENT_AMBULATORY_CARE_PROVIDER_SITE_OTHER): Payer: Medicare Other | Admitting: Emergency Medicine

## 2014-03-29 VITALS — BP 130/86 | HR 85 | Ht 75.0 in | Wt 211.2 lb

## 2014-03-29 DIAGNOSIS — J4489 Other specified chronic obstructive pulmonary disease: Secondary | ICD-10-CM

## 2014-03-29 DIAGNOSIS — J449 Chronic obstructive pulmonary disease, unspecified: Secondary | ICD-10-CM

## 2014-03-29 MED ORDER — UMECLIDINIUM-VILANTEROL 62.5-25 MCG/INH IN AEPB: 1.0000 | INHALATION_SPRAY | Freq: Every day | RESPIRATORY_TRACT | Status: DC

## 2014-03-29 NOTE — Telephone Encounter (Signed)
Pt aware rx for Anoro sent to pharm Nothing further needed

## 2014-03-29 NOTE — Progress Notes (Signed)
Subjective:    Patient ID: Michael Ellison, male    DOB: January 22, 1946, 68 y.o.   MRN: [id number]  HPI 68 y.o. y/o male, smoker, with a PMH of ETOH, HTN, Prostate Cancer admitted to [facility] 02/24/2012 with nausea / vomiting and abdominal pain secondary to small bowel obstruction due to adhesions. Underwent exploratory laparotomy with small bowel resection 5/10. Developed delirium post-op. Transferred to [facility] 5/14 for respiratory failure requiring intubation. Developed abdominal fascial dehiscence and taken back to OR 5/16. Failed extubation, and required tracheostomy 5/22. Weaned to [facility] 6/21. He was noted to have dysphagia and was evaluated by Speech Therapy and found to have a large Zenker diverticulum. He is status post Endoscopic Zenker diverticulectomy 7/2. Swallowing / diet was advanced post procedure. He was decanulated 8/7, PEG tube out 8/1. Presents now for eval of his COPD.   He was d/c to home on albuterol nebs q6h and pulmicort nebs q12h. Tells me that he is having some exertional SOB. He does not have any O2 at this time.   ROV 67/88/50 -- Complicated hospitalization as above, presents back today for COPD. PFT today with moderately severe AFL, restriction on volumes, decreased DLCO that corrects for Va. Hypoxemia identified last week - started O2. On albuterol prn + pulmicort. He does still have DOE. Wearing his O2 reliably.   ROV 67/26/13 -- COPD with moderately severe AFL. He is only using his O2 prn. He is on Spiriva, started 10/13. Also doing pulmicort + albuterol. He can't tell much difference, but he is less congested, having less cough. He uses albuterol about 2x a day.  He is active, walks every morning.   ROV 67/26/14 -- COPD, hypoxemia. On spiriva + pulmicort/albuterol has been using prn. Returns today for f/u. He is wearing O2 more reliably, mainly because he has been having more trouble for last 3-4 weeks. He believes he has been worse since his hernia surgery. He is coughing, white  mucous. He is dyspneic especially at night when supine.   ROV 67/20/15 -- returns for COPD, hypoxemia. Last time we treated for an acute flare, added Breo to spirva stopped pulmicort. He ran out of [place] after the samplesran out, Spiriva sometime before March.  Reports more dyspnea. He was just prescribed duonebs by his PCP to take qid. He hasn't smoked since 3 yrs ago.   ROV 03/29/14 -- follow up for COPD, hypoxemia. Last time we started Anoro and feels he has benefited. SABA use is low. Occas wheeze and cough > better. He likes the new medication     Objective:   Physical Exam Filed Vitals:   03/29/14 1107  BP: 130/86  Pulse: 85  Height: 6\' 3"  (1.905 m)  Weight: 211 lb 3.2 oz (95.8 kg)  SpO2: 99%   Gen: Pleasant, thin, in no distress,  normal affect  ENT: No lesions,  mouth clear,  oropharynx clear, no postnasal drip  Neck: No JVD, no TMG, no carotid bruits, trach site closing. Stoma has closed, looks good  Lungs: No use of accessory muscles, B diffuse exp wheezing  Cardiovascular: RRR, heart sounds normal, no murmur or gallops, no peripheral edema  Musculoskeletal: No deformities, no cyanosis or clubbing  Neuro: alert, non focal  Skin: Warm, no lesions or rashes     Assessment & Plan:  COPD (chronic obstructive pulmonary disease) He has benefited from Anoro + albuterol. I like the combo, hope it will be cost effective. Will plan to continue, follow up in 4 months.

## 2014-03-29 NOTE — Patient Instructions (Signed)
Please continue Anoro once a day Use you albuterol 2 puffs if needed for shortness of breath Wear you oxygen at all times .Follow with Dr Lamonte Sakai in 4 months or sooner if you have any problems.

## 2014-03-29 NOTE — Assessment & Plan Note (Signed)
He has benefited from Anoro + albuterol. I like the combo, hope it will be cost effective. Will plan to continue, follow up in 4 months.

## 2014-04-02 ENCOUNTER — Telehealth: Payer: Self-pay | Admitting: Internal Medicine

## 2014-04-02 NOTE — Telephone Encounter (Signed)
I called spoke w/ pt. He reports Anoro is too expensive for him. Requesting an alternative. Please advise RB thanks

## 2014-04-05 ENCOUNTER — Telehealth: Payer: Self-pay | Admitting: Emergency Medicine

## 2014-04-05 NOTE — Telephone Encounter (Signed)
Duplicate message see prior phone note 

## 2014-04-05 NOTE — Telephone Encounter (Signed)
I called spoke with pt. He was calling checking on this. He has 1 week left of anoro. Please advise RB thanks

## 2014-04-08 MED ORDER — IPRATROPIUM-ALBUTEROL 20-100 MCG/ACT IN AERS: 1.0000 | INHALATION_SPRAY | Freq: Four times a day (QID) | RESPIRATORY_TRACT | Status: DC

## 2014-04-08 NOTE — Telephone Encounter (Signed)
Spoke with pt and advised him of rx change and that it was sent to pharm. He verbalized understanding and had no questions

## 2014-04-08 NOTE — Telephone Encounter (Signed)
You can try changing him to combivent qid > I don't know if this will be more cost effective. Another option would be duonebs qid

## 2014-04-17 ENCOUNTER — Encounter (HOSPITAL_COMMUNITY): Payer: Self-pay | Admitting: Emergency Medicine

## 2014-04-17 ENCOUNTER — Emergency Department (HOSPITAL_COMMUNITY): Payer: Medicare Other

## 2014-04-17 ENCOUNTER — Inpatient Hospital Stay (HOSPITAL_COMMUNITY)
Admission: EM | Admit: 2014-04-17 | Discharge: 2014-04-20 | DRG: 190 | Disposition: A | Discharge status: Home / Self Care | Payer: Medicare Other | Attending: Internal Medicine | Admitting: Internal Medicine

## 2014-04-17 ENCOUNTER — Telehealth: Payer: Self-pay | Admitting: Emergency Medicine

## 2014-04-17 DIAGNOSIS — Z87891 Personal history of nicotine dependence: Secondary | ICD-10-CM | POA: Diagnosis not present

## 2014-04-17 DIAGNOSIS — Z9981 Dependence on supplemental oxygen: Secondary | ICD-10-CM | POA: Diagnosis not present

## 2014-04-17 DIAGNOSIS — Z8546 Personal history of malignant neoplasm of prostate: Secondary | ICD-10-CM

## 2014-04-17 DIAGNOSIS — F1010 Alcohol abuse, uncomplicated: Secondary | ICD-10-CM

## 2014-04-17 DIAGNOSIS — F3289 Other specified depressive episodes: Secondary | ICD-10-CM | POA: Diagnosis present

## 2014-04-17 DIAGNOSIS — J45901 Unspecified asthma with (acute) exacerbation: Principal | ICD-10-CM

## 2014-04-17 DIAGNOSIS — F329 Major depressive disorder, single episode, unspecified: Secondary | ICD-10-CM | POA: Diagnosis present

## 2014-04-17 DIAGNOSIS — J9620 Acute and chronic respiratory failure, unspecified whether with hypoxia or hypercapnia: Secondary | ICD-10-CM | POA: Diagnosis present

## 2014-04-17 DIAGNOSIS — J9601 Acute respiratory failure with hypoxia: Secondary | ICD-10-CM

## 2014-04-17 DIAGNOSIS — D649 Anemia, unspecified: Secondary | ICD-10-CM | POA: Diagnosis present

## 2014-04-17 DIAGNOSIS — J441 Chronic obstructive pulmonary disease with (acute) exacerbation: Secondary | ICD-10-CM | POA: Diagnosis present

## 2014-04-17 DIAGNOSIS — D508 Other iron deficiency anemias: Secondary | ICD-10-CM

## 2014-04-17 DIAGNOSIS — J410 Simple chronic bronchitis: Secondary | ICD-10-CM

## 2014-04-17 DIAGNOSIS — I10 Essential (primary) hypertension: Secondary | ICD-10-CM

## 2014-04-17 DIAGNOSIS — F1011 Alcohol abuse, in remission: Secondary | ICD-10-CM

## 2014-04-17 DIAGNOSIS — K432 Incisional hernia without obstruction or gangrene: Secondary | ICD-10-CM

## 2014-04-17 DIAGNOSIS — J9621 Acute and chronic respiratory failure with hypoxia: Secondary | ICD-10-CM

## 2014-04-17 DIAGNOSIS — J9600 Acute respiratory failure, unspecified whether with hypoxia or hypercapnia: Secondary | ICD-10-CM | POA: Diagnosis present

## 2014-04-17 HISTORY — DX: Unspecified abdominal hernia without obstruction or gangrene: K46.9

## 2014-04-17 LAB — COMPREHENSIVE METABOLIC PANEL
ALT: 11 U/L (ref 0–53)
AST: 12 U/L (ref 0–37)
Albumin: 3.5 g/dL (ref 3.5–5.2)
Alkaline Phosphatase: 65 U/L (ref 39–117)
BUN: 13 mg/dL (ref 6–23)
CALCIUM: 8.8 mg/dL (ref 8.4–10.5)
CHLORIDE: 104 meq/L (ref 96–112)
CO2: 24 mEq/L (ref 19–32)
Creatinine, Ser: 0.83 mg/dL (ref 0.50–1.35)
GFR calc Af Amer: >90 mL/min (ref >90)
GFR calc non Af Amer: 89 mL/min — ABNORMAL LOW (ref >90)
Glucose, Bld: 116 mg/dL — ABNORMAL HIGH (ref 70–99)
Potassium: 3.9 mEq/L (ref 3.7–5.3)
SODIUM: 139 meq/L (ref 137–147)
Total Bilirubin: 0.3 mg/dL (ref 0.3–1.2)
Total Protein: 7.3 g/dL (ref 6.0–8.3)

## 2014-04-17 LAB — CREATININE, SERUM
CREATININE: 0.8 mg/dL (ref 0.50–1.35)
GFR calc Af Amer: >90 mL/min (ref >90)
GFR calc non Af Amer: >90 mL/min (ref >90)

## 2014-04-17 LAB — BLOOD GAS, ARTERIAL
ACID-BASE DEFICIT: 1 mmol/L (ref 0.0–2.0)
Bicarbonate: 23 mEq/L (ref 20.0–24.0)
DELIVERY SYSTEMS: POSITIVE
DRAWN BY: 234301
Expiratory PAP: 5
FIO2: 35 %
INSPIRATORY PAP: 10
O2 SAT: 97.5 %
PATIENT TEMPERATURE: 37
TCO2: 19.8 mmol/L (ref 0–100)
pCO2 arterial: 37 mmHg (ref 35.0–45.0)
pH, Arterial: 7.41 (ref 7.350–7.450)
pO2, Arterial: 93.8 mmHg (ref 80.0–100.0)

## 2014-04-17 LAB — CBC WITH DIFFERENTIAL/PLATELET
Basophils Absolute: 0.1 10*3/uL (ref 0.0–0.1)
Basophils Relative: 1 % (ref 0–1)
EOS PCT: 5 % (ref 0–5)
Eosinophils Absolute: 0.5 10*3/uL (ref 0.0–0.7)
HCT: 44.2 % (ref 39.0–52.0)
Hemoglobin: 15 g/dL (ref 13.0–17.0)
LYMPHS PCT: 31 % (ref 12–46)
Lymphs Abs: 3.1 10*3/uL (ref 0.7–4.0)
MCH: 27.2 pg (ref 26.0–34.0)
MCHC: 33.9 g/dL (ref 30.0–36.0)
MCV: 80.1 fL (ref 78.0–100.0)
Monocytes Absolute: 0.5 10*3/uL (ref 0.1–1.0)
Monocytes Relative: 5 % (ref 3–12)
NEUTROS ABS: 6 10*3/uL (ref 1.7–7.7)
Neutrophils Relative %: 58 % (ref 43–77)
PLATELETS: 210 10*3/uL (ref 150–400)
RBC: 5.52 MIL/uL (ref 4.22–5.81)
RDW: 15.1 % (ref 11.5–15.5)
WBC: 10.2 10*3/uL (ref 4.0–10.5)

## 2014-04-17 LAB — PRO B NATRIURETIC PEPTIDE: Pro B Natriuretic peptide (BNP): 58.1 pg/mL (ref 0–125)

## 2014-04-17 LAB — URINALYSIS, ROUTINE W REFLEX MICROSCOPIC
BILIRUBIN URINE: NEGATIVE
Glucose, UA: NEGATIVE mg/dL
HGB URINE DIPSTICK: NEGATIVE
KETONES UR: NEGATIVE mg/dL
Leukocytes, UA: NEGATIVE
Nitrite: NEGATIVE
PROTEIN: NEGATIVE mg/dL
Specific Gravity, Urine: 1.023 (ref 1.005–1.030)
Urobilinogen, UA: 0.2 mg/dL (ref 0.0–1.0)
pH: 5 (ref 5.0–8.0)

## 2014-04-17 LAB — CBC
HCT: 45.7 % (ref 39.0–52.0)
Hemoglobin: 15.4 g/dL (ref 13.0–17.0)
MCH: 27.2 pg (ref 26.0–34.0)
MCHC: 33.7 g/dL (ref 30.0–36.0)
MCV: 80.7 fL (ref 78.0–100.0)
PLATELETS: 222 10*3/uL (ref 150–400)
RBC: 5.66 MIL/uL (ref 4.22–5.81)
RDW: 15.2 % (ref 11.5–15.5)
WBC: 12.5 10*3/uL — ABNORMAL HIGH (ref 4.0–10.5)

## 2014-04-17 LAB — LACTIC ACID, PLASMA: LACTIC ACID, VENOUS: 1.2 mmol/L (ref 0.5–2.2)

## 2014-04-17 LAB — MRSA PCR SCREENING: MRSA by PCR: NEGATIVE

## 2014-04-17 LAB — TROPONIN I
Troponin I: <0.3 ng/mL (ref <0.30)
Troponin I: <0.3 ng/mL (ref <0.30)

## 2014-04-17 MED ORDER — LOSARTAN POTASSIUM 50 MG PO TABS
50.0000 mg | ORAL_TABLET | Freq: Every day | ORAL | Status: DC
  Administered 2014-04-17 – 2014-04-19 (×3): 50 mg via ORAL
  Filled 2014-04-17 (×4): qty 1

## 2014-04-17 MED ORDER — IPRATROPIUM BROMIDE 0.02 % IN SOLN
0.5000 mg | Freq: Once | RESPIRATORY_TRACT | Status: AC
  Administered 2014-04-17: 0.5 mg via RESPIRATORY_TRACT
  Filled 2014-04-17: qty 2.5

## 2014-04-17 MED ORDER — LEVOFLOXACIN IN D5W 500 MG/100ML IV SOLN
500.0000 mg | INTRAVENOUS | Status: DC
  Administered 2014-04-17 – 2014-04-19 (×3): 500 mg via INTRAVENOUS
  Filled 2014-04-17 (×5): qty 100

## 2014-04-17 MED ORDER — MAGNESIUM SULFATE 40 MG/ML IJ SOLN
2.0000 g | Freq: Once | INTRAMUSCULAR | Status: AC
  Administered 2014-04-17: 2 g via INTRAVENOUS
  Filled 2014-04-17: qty 50

## 2014-04-17 MED ORDER — LEVOFLOXACIN IN D5W 500 MG/100ML IV SOLN
500.0000 mg | Freq: Once | INTRAVENOUS | Status: AC
  Administered 2014-04-17: 500 mg via INTRAVENOUS
  Filled 2014-04-17: qty 100

## 2014-04-17 MED ORDER — IPRATROPIUM-ALBUTEROL 0.5-2.5 (3) MG/3ML IN SOLN
3.0000 mL | Freq: Once | RESPIRATORY_TRACT | Status: AC
  Administered 2014-04-17: 3 mL via RESPIRATORY_TRACT
  Filled 2014-04-17: qty 3

## 2014-04-17 MED ORDER — SODIUM CHLORIDE 0.9 % IJ SOLN
3.0000 mL | INTRAMUSCULAR | Status: DC | PRN
Start: 2014-04-17 — End: 2014-04-20

## 2014-04-17 MED ORDER — TRAMADOL-ACETAMINOPHEN 37.5-325 MG PO TABS
1.0000 | ORAL_TABLET | Freq: Two times a day (BID) | ORAL | Status: DC
  Administered 2014-04-17 – 2014-04-19 (×5): 1 via ORAL
  Filled 2014-04-17 (×5): qty 1

## 2014-04-17 MED ORDER — ALBUTEROL SULFATE (2.5 MG/3ML) 0.083% IN NEBU
INHALATION_SOLUTION | RESPIRATORY_TRACT | Status: AC
  Administered 2014-04-17: 15 mg
  Filled 2014-04-17: qty 18

## 2014-04-17 MED ORDER — ALBUTEROL (5 MG/ML) CONTINUOUS INHALATION SOLN: 15.0000 mg/h | INHALATION_SOLUTION | Freq: Once | RESPIRATORY_TRACT | Status: DC

## 2014-04-17 MED ORDER — LISINOPRIL 20 MG PO TABS
20.0000 mg | ORAL_TABLET | Freq: Every day | ORAL | Status: DC
  Administered 2014-04-17: 20 mg via ORAL
  Filled 2014-04-17: qty 1

## 2014-04-17 MED ORDER — METHYLPREDNISOLONE SODIUM SUCC 125 MG IJ SOLR
60.0000 mg | Freq: Four times a day (QID) | INTRAMUSCULAR | Status: DC
  Administered 2014-04-17 – 2014-04-18 (×4): 60 mg via INTRAVENOUS
  Filled 2014-04-17 (×7): qty 0.96
  Filled 2014-04-17: qty 2

## 2014-04-17 MED ORDER — ALBUTEROL SULFATE (2.5 MG/3ML) 0.083% IN NEBU
2.5000 mg | INHALATION_SOLUTION | Freq: Once | RESPIRATORY_TRACT | Status: AC
  Administered 2014-04-17: 2.5 mg via RESPIRATORY_TRACT
  Filled 2014-04-17: qty 3

## 2014-04-17 MED ORDER — IPRATROPIUM-ALBUTEROL 0.5-2.5 (3) MG/3ML IN SOLN
3.0000 mL | RESPIRATORY_TRACT | Status: DC
  Administered 2014-04-17 – 2014-04-18 (×8): 3 mL via RESPIRATORY_TRACT
  Filled 2014-04-17 (×9): qty 3

## 2014-04-17 MED ORDER — SODIUM CHLORIDE 0.9 % IV SOLN: 250.0000 mL | INTRAVENOUS | Status: DC | PRN

## 2014-04-17 MED ORDER — ALBUTEROL (5 MG/ML) CONTINUOUS INHALATION SOLN
15.0000 mg/h | INHALATION_SOLUTION | Freq: Once | RESPIRATORY_TRACT | Status: AC
  Administered 2014-04-17: 15 mg/h via RESPIRATORY_TRACT
  Filled 2014-04-17: qty 20

## 2014-04-17 MED ORDER — SODIUM CHLORIDE 0.9 % IJ SOLN
3.0000 mL | Freq: Two times a day (BID) | INTRAMUSCULAR | Status: DC
  Administered 2014-04-17 – 2014-04-19 (×3): 3 mL via INTRAVENOUS

## 2014-04-17 MED ORDER — ACETAMINOPHEN 325 MG PO TABS: 650.0000 mg | ORAL_TABLET | Freq: Four times a day (QID) | ORAL | Status: DC | PRN

## 2014-04-17 MED ORDER — ENOXAPARIN SODIUM 40 MG/0.4ML ~~LOC~~ SOLN
40.0000 mg | SUBCUTANEOUS | Status: DC
  Administered 2014-04-17 – 2014-04-19 (×3): 40 mg via SUBCUTANEOUS
  Filled 2014-04-17 (×5): qty 0.4

## 2014-04-17 MED ORDER — ACETAMINOPHEN 650 MG RE SUPP: 650.0000 mg | Freq: Four times a day (QID) | RECTAL | Status: DC | PRN

## 2014-04-17 MED ORDER — GUAIFENESIN ER 600 MG PO TB12
1200.0000 mg | ORAL_TABLET | Freq: Two times a day (BID) | ORAL | Status: DC
  Administered 2014-04-17 – 2014-04-19 (×5): 1200 mg via ORAL
  Filled 2014-04-17 (×8): qty 2

## 2014-04-17 NOTE — ED Notes (Addendum)
Patient brought in via EMS. Airway patent. Patient labored breathing, on C-pap. Per EMS patient was c/o shortness of breath that started last night and progressively has gotten worse despite using his MDIs. Patient given 125mg  of solumedrol via IV and received 5mg  albuterol neb in route to hospital. Per EMS personal 12 lead EKG normal sinus rhythm. Dr Tomi Bamberger and resp therapist in room upon patient's arrival.

## 2014-04-17 NOTE — ED Provider Notes (Signed)
CSN: [id number]     Arrival date & time 04/17/14  0711 History  This chart was scribed for Janice Norrie, MD by Elby Beck, ED Scribe. This patient was seen in room APA02/APA02 and the patient's care was started at 7:15 AM.   Chief Complaint  Patient presents with  . Shortness of Breath    The history is provided by the patient and the EMS personnel. The history is limited by the condition of the patient. No language interpreter was used.   LEVEL 5 CAVEAT (Respiratory Distress)  PT seen on arrival  HPI Comments: RASHIED CORALLO is a 68 y.o. Male with a history of COPD and asthma brought by EMS to the Emergency Department for acute respiratory distress. Per EMS, pt reported progressively worsening SOB onset last night. Pt reports an associated cough, productive of white sputum, over the past few days. He also noted subjective fever and chills. PT denies chest pain.  EMS reported that on their arrival, pt was noted to have bilateral wheezing. Per EMS, pt used his prescribed Combivent and Anoro inhalers prior to their arrival with minimal relief. EMS reports giving the pt 2.5 mg of nebulized albuterol and put pt on CPAP and gave another 2.5 mg of albuterol and 125 mg Solumedrol with some relief. EMS did not get an pulse ox reading on the pt, but his oxygen saturation was 99% in triage. Pt is on home O2 PRN.  Pt is a former smoker of 44 years.   Pulmonologist- Dr. Lamonte Sakai PCP Dr Legrand Rams  Past Medical History  Diagnosis Date  . Hypertension   . Cancer   . Cancer of prostate   . Depression   . Asthma   . Shortness of breath   . COPD (chronic obstructive pulmonary disease)     home O2  . Aspiration pneumonia 03/01/2012  . Hernia of abdominal cavity    Past Surgical History  Procedure Laterality Date  . Hernia repair    . Prostate biopsy    . Robotic prostate surgery  2011  . Laparotomy  02/25/2012    Procedure: EXPLORATORY LAPAROTOMY;  Surgeon: Donato Heinz, MD;  Location: [facility];   Service: General;  Laterality: N/A;  . Bowel resection  02/25/2012    Procedure: SMALL BOWEL RESECTION;  Surgeon: Donato Heinz, MD;  Location: [facility];  Service: General;;  . Wound debridement  03/02/2012    Procedure: DEBRIDEMENT CLOSURE/ABDOMINAL WOUND;  Surgeon: Gwenyth Ober, MD;  Location: [place];  Service: General;  Laterality: N/A;  . Tracheostomy  03/2012  . Ventral hernia repair  12/25/2012     Dr Hulen Skains  . Ventral hernia repair N/A 12/25/2012    Procedure: HERNIA REPAIR VENTRAL ADULT;  Surgeon: Gwenyth Ober, MD;  Location: [place];  Service: General;  Laterality: N/A;  . Insertion of mesh N/A 12/25/2012    Procedure: INSERTION OF MESH;  Surgeon: Gwenyth Ober, MD;  Location: [place];  Service: General;  Laterality: N/A;  . Incision and drainage abscess N/A 01/15/2013    Procedure: INCISION AND DRAINAGE Abdominal Wall ABSCESS;  Surgeon: Gwenyth Ober, MD;  Location: [place];  Service: General;  Laterality: N/A;   Family History  Problem Relation Age of Onset  . Pseudochol deficiency Neg Hx   . Anesthesia problems Neg Hx   . Hypotension Neg Hx   . Malignant hyperthermia Neg Hx    History  Substance Use Topics  . Smoking status: Former Smoker --  1.50 packs/day for 44 years    Types: Cigarettes    Quit date: 02/22/2012  . Smokeless tobacco: Never Used  . Alcohol Use: No     Comment: occasional beer 1-2  home oxygen prn Lives at home  Review of Systems  Unable to perform ROS: Severe respiratory distress    Allergies  Lorazepam  Home Medications   Prior to Admission medications   Medication Sig Start Date End Date Taking? Authorizing Provider  albuterol (PROVENTIL HFA;VENTOLIN HFA) 108 (90 BASE) MCG/ACT inhaler Inhale 2 puffs into the lungs every 6 (six) hours as needed for shortness of breath.    Historical Provider, MD  albuterol (PROVENTIL) (5 MG/ML) 0.5% nebulizer solution Take 2.5 mg by nebulization every 6 (six) hours. AND Q3 PRN SOB or Wheezing 04/25/12   Donita Brooks, NP   escitalopram (LEXAPRO) 10 MG tablet Take 10 mg by mouth daily.    Historical Provider, MD  furosemide (LASIX) 40 MG tablet Take 40 mg by mouth daily.    Historical Provider, MD  HYDROcodone-acetaminophen (NORCO/VICODIN) 5-325 MG per tablet  01/01/13   Historical Provider, MD  Ipratropium-Albuterol (COMBIVENT RESPIMAT) 20-100 MCG/ACT AERS respimat Inhale 1 puff into the lungs every 6 (six) hours. 04/08/14   Collene Gobble, MD  ipratropium-albuterol (DUONEB) 0.5-2.5 (3) MG/3ML SOLN Take 3 mLs by nebulization every 6 (six) hours as needed.    Historical Provider, MD  lisinopril (PRINIVIL,ZESTRIL) 20 MG tablet 1 tablet daily. 02/18/14   Historical Provider, MD  traMADol-acetaminophen (ULTRACET) 37.5-325 MG per tablet 1 tablet 2 (two) times daily. 02/18/14   Historical Provider, MD  Umeclidinium-Vilanterol (ANORO ELLIPTA) 62.5-25 MCG/INH AEPB Inhale 1 puff into the lungs daily. 03/29/14   Collene Gobble, MD   Triage Vitals: BP 155/97  Pulse 92  Resp 18  Ht 6\' 2"  (1.88 m)  Wt 210 lb (95.255 kg)  BMI 26.95 kg/m2  SpO2 99%  Vital signs normal except hypertension   Physical Exam  Nursing note and vitals reviewed. Constitutional: He is oriented to person, place, and time. He appears well-developed and well-nourished.  Non-toxic appearance. He does not appear ill. No distress.  HENT:  Head: Normocephalic and atraumatic.  Right Ear: External ear normal.  Left Ear: External ear normal.  Nose: Nose normal. No mucosal edema or rhinorrhea.  Mouth/Throat: Oropharynx is clear and moist and mucous membranes are normal. No dental abscesses or uvula swelling.  CPAP in place.  Eyes: Conjunctivae and EOM are normal. Pupils are equal, round, and reactive to light.  Neck: Normal range of motion and full passive range of motion without pain. Neck supple.  Cardiovascular: Normal rate, regular rhythm and normal heart sounds.  Exam reveals no gallop and no friction rub.   No murmur heard. Pulmonary/Chest: Accessory  muscle usage present. Tachypnea noted. He is in respiratory distress. He has wheezes. He has no rhonchi. He exhibits no crepitus.  Very diminished breath sounds, but diffuse expiratory high-pitched wheezes  Abdominal: Soft. Normal appearance and bowel sounds are normal. He exhibits no distension. There is no tenderness. There is no rebound and no guarding.  Very large ventral hernia that is soft  Musculoskeletal: Normal range of motion. He exhibits no edema and no tenderness.  Moves all extremities well.   Neurological: He is alert and oriented to person, place, and time. He has normal strength. No cranial nerve deficit.  Skin: Skin is warm, dry and intact. No rash noted. No erythema. No pallor.  Psychiatric: He has a normal  mood and affect. His speech is normal and behavior is normal. His mood appears not anxious.    ED Course  Procedures (including critical care time) Medications  albuterol (PROVENTIL,VENTOLIN) solution continuous neb (15 mg/hr Nebulization Not Given 04/17/14 0743)  ipratropium (ATROVENT) nebulizer solution 0.5 mg (0.5 mg Nebulization Given 04/17/14 0735)  albuterol (PROVENTIL) (2.5 MG/3ML) 0.083% nebulizer solution (15 mg  Given 04/17/14 0735)  levofloxacin (LEVAQUIN) IVPB 500 mg (0 mg Intravenous Stopped 04/17/14 1016)  ipratropium-albuterol (DUONEB) 0.5-2.5 (3) MG/3ML nebulizer solution 3 mL (3 mLs Nebulization Given 04/17/14 1016)  albuterol (PROVENTIL) (2.5 MG/3ML) 0.083% nebulizer solution 2.5 mg (2.5 mg Nebulization Given 04/17/14 1017)  albuterol (PROVENTIL,VENTOLIN) solution continuous neb (15 mg/hr Nebulization Given 04/17/14 1122)  ipratropium (ATROVENT) nebulizer solution 0.5 mg (0.5 mg Nebulization Given 04/17/14 1123)  magnesium sulfate IVPB 2 g 50 mL (0 g Intravenous Stopped 04/17/14 1208)     DIAGNOSTIC STUDIES: Oxygen Saturation is 99% on CPAP, normal by my interpretation.    COORDINATION OF CARE: 7:19 AM- Will order a CXR and diagnostic lab work. Pt switched over from  EMS CPAP to Bipap, continuous nebulizer was started. Pt advised of plan for treatment and pt agrees.  8:02 AM- Recheck with pt and he states that he is feeling a little better. Pt continues on Bipap and is about half way through his continuous nebulizer. He has mildly improved air movement and diffuse lower pitched wheezes and rhonchi. Discussed CXR results with the pt, indicating that there is no pneumonia, but there are signs of chronic COPD changes. Advised pt that he will need to be admitted. Family members are now present and they would like for the pt to be transferred to [facility] if possible because his pulmonary doctor is there. Pt states to do whatever his family wants.   08:50 Pt started on IV antibiotics for bronchitis  9:06 AM- Discussed pt's case with Dr. Chase Caller, critical care at [facility],  who advised to have [facility] hospitalist admit pt to [facility] and they will consult.  09:24 Dr Ree Kida, will evaluate patient for transfer to [facility] for admission to the hospitalist service.   9:45 AM- Pt is breathing harder. He has louder expiratory wheezing. Will order another breathing treatment.   10:35 AM- Rechecked  and pt has diffuse low pitched wheezing. He states that he is only feeling slightly better since receiving his 2nd breathing treatment.   10:40 D/W Dr Ree Kida, will do another continuous nebulizer.   1:12 PM- Recheck and pt states that he is feeling about the same. He has diffuse wheezing. He appears less labored  I am going to add IV magnesium to see if that will help with his bronchospasm.   14:16 [facility] here to transport patient. Pt still has diffuse wheezing, has less retractions. [facility] has asked to take patient off of Bipap, advised he needs to remain on bipap, unless they speak to Dr Ree Kida and she feels he can be taken off of it.   Results for orders placed during the hospital encounter of 04/17/14  CBC WITH DIFFERENTIAL      Result Value Ref Range   WBC 10.2  4.0 -  10.5 K/uL   RBC 5.52  4.22 - 5.81 MIL/uL   Hemoglobin 15.0  13.0 - 17.0 g/dL   HCT 44.2  39.0 - 52.0 %   MCV 80.1  78.0 - 100.0 fL   MCH 27.2  26.0 - 34.0 pg   MCHC 33.9  30.0 - 36.0 g/dL  RDW 15.1  11.5 - 15.5 %   Platelets 210  150 - 400 K/uL   Neutrophils Relative % 58  43 - 77 %   Neutro Abs 6.0  1.7 - 7.7 K/uL   Lymphocytes Relative 31  12 - 46 %   Lymphs Abs 3.1  0.7 - 4.0 K/uL   Monocytes Relative 5  3 - 12 %   Monocytes Absolute 0.5  0.1 - 1.0 K/uL   Eosinophils Relative 5  0 - 5 %   Eosinophils Absolute 0.5  0.0 - 0.7 K/uL   Basophils Relative 1  0 - 1 %   Basophils Absolute 0.1  0.0 - 0.1 K/uL  COMPREHENSIVE METABOLIC PANEL      Result Value Ref Range   Sodium 139  137 - 147 mEq/L   Potassium 3.9  3.7 - 5.3 mEq/L   Chloride 104  96 - 112 mEq/L   CO2 24  19 - 32 mEq/L   Glucose, Bld 116 (*) 70 - 99 mg/dL   BUN 13  6 - 23 mg/dL   Creatinine, Ser 0.83  0.50 - 1.35 mg/dL   Calcium 8.8  8.4 - 10.5 mg/dL   Total Protein 7.3  6.0 - 8.3 g/dL   Albumin 3.5  3.5 - 5.2 g/dL   AST 12  0 - 37 U/L   ALT 11  0 - 53 U/L   Alkaline Phosphatase 65  39 - 117 U/L   Total Bilirubin 0.3  0.3 - 1.2 mg/dL   GFR calc non Af Amer 89 (*) >90 mL/min   GFR calc Af Amer >90  >90 mL/min  PRO B NATRIURETIC PEPTIDE      Result Value Ref Range   Pro B Natriuretic peptide (BNP) 58.1  0 - 125 pg/mL  TROPONIN I      Result Value Ref Range   Troponin I <0.30  <0.30 ng/mL  LACTIC ACID, PLASMA      Result Value Ref Range   Lactic Acid, Venous 1.2  0.5 - 2.2 mmol/L  BLOOD GAS, ARTERIAL      Result Value Ref Range   FIO2 35.00     Delivery systems BILEVEL POSITIVE AIRWAY PRESSURE     Inspiratory PAP 10     Expiratory PAP 5     pH, Arterial 7.410  7.350 - 7.450   pCO2 arterial 37.0  35.0 - 45.0 mmHg   pO2, Arterial 93.8  80.0 - 100.0 mmHg   Bicarbonate 23.0  20.0 - 24.0 mEq/L   TCO2 19.8  0 - 100 mmol/L   Acid-base deficit 1.0  0.0 - 2.0 mmol/L   O2 Saturation 97.5     Patient temperature  37.0     Collection site RIGHT BRACHIAL     Drawn by [id number]     Sample type ARTERIAL     Allens test (pass/fail) PASS  PASS    Laboratory interpretation all normal   Dg Chest Portable 1 View  04/17/2014   CLINICAL DATA:  Shortness of Breath  EXAM: PORTABLE CHEST - 1 VIEW  COMPARISON:  December 25, 2012  FINDINGS: There is underlying emphysematous change. Bullous disease in the upper lobes is stable. There is diffuse reticular interstitial disease which is stable and probably reflects chronic inflammatory type change. There may be a mild degree of chronic congestive heart failure superimposed. There is no airspace consolidation. The heart is mildly prominent given underlying emphysema. The pulmonary vascularity reflects underlying emphysema and is stable.  No adenopathy.  IMPRESSION: Underlying emphysematous change. Question mild superimposed chronic congestive heart failure versus chronic inflammatory type change in the lungs. Both entities may exist concurrently. No airspace consolidation.   Electronically Signed   By: Lowella Grip M.D.   On: 04/17/2014 07:41     EKG Interpretation   Date/Time:  Wednesday April 17 2014 07:15:44 EDT Ventricular Rate:  93 PR Interval:  73 QRS Duration: 96 QT Interval:  344 QTC Calculation: 428 R Axis:   62 Text Interpretation:  Sinus rhythm Short PR interval Abnormal inferior Q  waves No significant change since last tracing  25 Dec 2012 Confirmed by  [facility]  MD-I, Zannah Melucci ([id number]) on 04/17/2014 7:24:40 AM      MDM   Final diagnoses:  COPD with exacerbation   Plan transfer to [facility] for admission  Rolland Porter, MD, [place] Performed by: Katelin Kutsch L Stanislaus Kaltenbach Total critical care time: 59 min Critical care time was exclusive of separately billable procedures and treating other patients. Critical care was necessary to treat or prevent imminent or life-threatening deterioration. Critical care was time spent personally by me on the following activities:  development of treatment plan with patient and/or surrogate as well as nursing, discussions with consultants, evaluation of patient's response to treatment, examination of patient, obtaining history from patient or surrogate, ordering and performing treatments and interventions, ordering and review of laboratory studies, ordering and review of radiographic studies, pulse oximetry and re-evaluation of patient's condition.    I personally performed the services described in this documentation, which was scribed in my presence. The recorded information has been reviewed and considered.  Rolland Porter, MD, [facility]   Janice Norrie, MD 04/17/14 [phone]

## 2014-04-17 NOTE — ED Notes (Signed)
Pt finished breathing tx and states he is "breathing easier". Pt does not appear to be in distress. Family at bedside.

## 2014-04-17 NOTE — Telephone Encounter (Signed)
Whoever is the attending md at [facility] will need to call [facility] to find an MD who will except in transfer.  They will know the protocol

## 2014-04-17 NOTE — Progress Notes (Signed)
Pt was not comfortable using Bipap, is 99% on 4L and asks to not wear it tonight. Bipap left in room, and pt knows to contact RT for any help or to be placed on. RT will continue to monitor.

## 2014-04-17 NOTE — Telephone Encounter (Signed)
Spoke with pt's daughter and she states that she received a call and they are preparing to transfer the pt shortly.  Nothing further needed.

## 2014-04-17 NOTE — Telephone Encounter (Signed)
Spoke with pt's daughter.  She states that pt was taken to [facility] by EMS over night for diff breathing and they are planning to admit him but family would like him transferred to [facility] .  They are more comfortable with the care he will receive from our doctors there.  Please advise on transfer.

## 2014-04-17 NOTE — ED Notes (Signed)
PCXR done

## 2014-04-17 NOTE — Consult Note (Signed)
PULMONARY  / CRITICAL CARE MEDICINE  Name: Michael Ellison MRN: [id number] DOB: July 08, 1946 PCP Rosita Fire, MD Pulmonary Dr Lamonte Sakai   ADMISSION DATE:  04/17/2014 LOS 0 days  CONSULTATION DATE:  No comment available   REFERRING MD :  [facility] PRIMARY SERVICE: [facility]  CHIEF COMPLAINT:  AECIOPD  BRIEF PATIENT DESCRIPTION: see hpi  LINES / TUBES: non  CULTURES: 04/17/14 - MRSA PCR  - negative 04/17/14 - urine culture -  04/17/14 - resp virus pcr panel   ANTIBIOTICS: Anti-infectives   Start     Dose/Rate Route Frequency Ordered Stop   04/17/14 1700  levofloxacin (LEVAQUIN) IVPB 500 mg     500 mg 100 mL/hr over 60 Minutes Intravenous Every 24 hours 04/17/14 1555     04/17/14 0900  levofloxacin (LEVAQUIN) IVPB 500 mg     500 mg 100 mL/hr over 60 Minutes Intravenous  Once 04/17/14 0850 04/17/14 1016        SIGNIFICANT EVENTS / STUDIES:  04/17/2014 - admit    HISTORY OF PRESENT ILLNESS:   68 y.o. y/o male, smoker, with a PMH of ETOH, HTN, Prostate Cancer admitted to [facility] 02/24/2012 with nausea / vomiting and abdominal pain secondary to small bowel obstruction due to adhesions. Underwent exploratory laparotomy with small bowel resection 5/10. Developed delirium post-op. Transferred to [facility] 02/29/12 for respiratory failure requiring intubation. Developed abdominal fascial dehiscence and taken back to OR 5/16. Failed extubation, and required tracheostomy 5/22. Weaned to [facility] 04/07/12. He was noted to have dysphagia and was evaluated by Speech Therapy and found to have a large Zenker diverticulum. He is status post Endoscopic Zenker diverticulectomy 04/18/12. Swallowing / diet was advanced post procedure. He was decanulated 8/7, PEG tube out 05/18/2012   Since fall 2013 following with Dr Angelica Chessman for COPD - gold stage 2 with dlco 71% based on OCt 2013 PFT using 2L [place] prn.   Review of chart shows several changes in mdi regimen but most recently 6/12?15 he stated  Anoro worked best but on 04/04/14 changed  to combivent respimat due to high cost of anoro. Since then has had steady decline in respiratory issues and esp over  the last few days he has had more shortness of breath than usual. He does state he uses his medications as prescribed.  Patient states he been using his oxygen on a regular basis due to the hot weather. He does state that he used several nebulizer treatments at home as well as his inhalers without resolution of his symptoms. Patient stated last night he became more increasingly short of breath and began to cough. He has experienced fever and chills over the last several days however does not know what his temperature was. Patient denies recent travel or sick contacts. He denies any secondhand smoke at this time. Patient is a former smoker. aBG at admission was normal.  However, patietn wanted transfer to [facility] and pulmonary consult  Of note his peripheral eosinophils was 500 cells at admission - marker of high risk recurrent AECOPD   At time of PCCM exam feels 50% better   PAST MEDICAL HISTORY :  Past Medical History  Diagnosis Date  . Hypertension   . Cancer   . Cancer of prostate   . Depression   . Asthma   . Shortness of breath   . COPD (chronic obstructive pulmonary disease)     home O2  . Aspiration pneumonia 03/01/2012  . Hernia of abdominal cavity      Family History  Problem Relation Age of Onset  . Pseudochol deficiency Neg Hx   . Anesthesia problems Neg Hx   . Hypotension Neg Hx   . Malignant hyperthermia Neg Hx      History   Social History  . Marital Status: Divorced    Spouse Name: N/A    Number of Children: N/A  . Years of Education: N/A   Occupational History  . retired     Corporate treasurer   Social History Main Topics  . Smoking status: Former Smoker -- 1.50 packs/day for 44 years    Types: Cigarettes    Quit date: 02/22/2012  . Smokeless tobacco: Never Used  . Alcohol Use: No     Comment: occasional beer 1-2  . Drug Use: No  . Sexual Activity: No    Other Topics Concern  . Not on file   Social History Narrative  . No narrative on file     Allergies  Allergen Reactions  . Lorazepam Other (See Comments)    Severe agitated delirium. Tolerates midazolam and other benzo's      (Not in an outpatient encounter)     REVIEW OF SYSTEMS:  Per HPI. Otherwise 11 point ROS negative  SUBJECTIVE:   VITAL SIGNS: Filed Vitals:   04/17/14 1548 04/17/14 1558 04/17/14 1600 04/17/14 1700  BP: 138/79  134/82 144/94  Pulse: 101  101 110  Temp:      TempSrc:      Resp: 15  20 18   Height:  6\' 3"  (1.905 m)    Weight:  95 kg (209 lb 7 oz)    SpO2: 99%  98% 96%      HEMODYNAMICS:   VENTILATOR SETTINGS: Vent Mode:  [-] PRVC FiO2 (%):  [30 %] 30 % Set Rate:  [28 bmp] 28 bmp Vt Set:  [420 mL] 420 mL PEEP:  [5 cmH20] 5 cmH20 Plateau Pressure:  [4 ESP23-30 cmH20] 20 cmH20 INTAKE / OUTPUT:       PHYSICAL EXAMINATION: General: Well developed, well nourished, NAD, appears stated age  HEENT: NCAT, PERRLA, EOMI, Anicteic Sclera, Off BiPAP. Has tracheostomy scar + Neck: Supple, no JVD, no masses  Cardiovascular: S1 S2 auscultated, no rubs, murmurs or gallops. Regular rate and rhythm.  Respiratory: Diffuse wheezing. +, ful sentences +, occ acc muscle use +. Full sentences + Abdomen: Soft, nontender, nondistended, + bowel sounds, hernia (incisional)  Extremities: warm dry without cyanosis clubbing or edema  Neuro: AAOx3, cranial nerves grossly intact. Strength 5/5 in patient's upper and lower extremities bilaterally  Skin: Without rashes exudates or nodules  Psych: Normal affect and demeanor with intact judgement and insight  LABS: PULMONARY  Recent Labs Lab 04/17/14 0840  PHART 7.410  PCO2ART 37.0  PO2ART 93.8  HCO3 23.0  TCO2 19.8  O2SAT 97.5    CBC  Recent Labs Lab 04/17/14 0716  HGB 15.0  HCT 44.2  WBC 10.2  PLT 210    COAGULATION No results found for this basename: INR,  in the last 168  hours  CARDIAC   Recent Labs Lab 04/17/14 0716  TROPONINI <0.30    Recent Labs Lab 04/17/14 0716  PROBNP 58.1     CHEMISTRY  Recent Labs Lab 04/17/14 0716  NA 139  K 3.9  CL 104  CO2 24  GLUCOSE 116*  BUN 13  CREATININE 0.83  CALCIUM 8.8   Estimated Creatinine Clearance: 103.2 ml/min (by C-G formula based on Cr of 0.83).   LIVER  Recent Labs Lab 04/17/14  0716  AST 12  ALT 11  ALKPHOS 65  BILITOT 0.3  PROT 7.3  ALBUMIN 3.5     INFECTIOUS  Recent Labs Lab 04/17/14 0727  LATICACIDVEN 1.2     ENDOCRINE CBG (last 3)  No results found for this basename: GLUCAP,  in the last 72 hours       IMAGING x48h  Dg Chest Portable 1 View  04/17/2014   CLINICAL DATA:  Shortness of Breath  EXAM: PORTABLE CHEST - 1 VIEW  COMPARISON:  December 25, 2012  FINDINGS: There is underlying emphysematous change. Bullous disease in the upper lobes is stable. There is diffuse reticular interstitial disease which is stable and probably reflects chronic inflammatory type change. There may be a mild degree of chronic congestive heart failure superimposed. There is no airspace consolidation. The heart is mildly prominent given underlying emphysema. The pulmonary vascularity reflects underlying emphysema and is stable. No adenopathy.  IMPRESSION: Underlying emphysematous change. Question mild superimposed chronic congestive heart failure versus chronic inflammatory type change in the lungs. Both entities may exist concurrently. No airspace consolidation.   Electronically Signed   By: Lowella Grip M.D.   On: 04/17/2014 07:41       ASSESSMENT / PLAN:  PULMONARY A: Acute on Chronic REspiratory Failure due to AECOPD due to    change in mdi from [facility] to Combivent,    summer heat   +/- resp virus   +/- ACE inhibitor   High peripheral eosinophils a marker of symptomatology and recurrent AECOPD  P:   Change lisinopril to losartan Duoneb q4h (at dc will need prior auth  for anoro) IV steroids and then totral 12-14 day pred course - ? Needs ICS at dc (need to check with Dr Lamonte Sakai) Agree with levaquin O2 for pulse ox > 88% BiPAP qhs + prn Await resp virus panel Rule out NSTEMI Check BNP  Will be likely be a good candidate for AStra ZEneca trial Phase 3 , RCT, double blind of IL5Rab Benralizumab SQ injection q4-8 weeks x 52 weeks (against eos ) v placebo in reducing COPD burden for patients with peripheral eos >/= 300.       Daughters updated    The patient is critically ill with multiple organ systems failure and requires high complexity decision making for assessment and support, frequent evaluation and titration of therapies, application of advanced monitoring technologies and extensive interpretation of multiple databases.   Critical Care Time devoted to patient care services described in this note is  35  Minutes.   Dr. Brand Males, M.D., [facility].C.P Pulmonary and Critical Care Medicine Staff Physician [facility] Pager: [phone], If no answer or between  15:00h - 7:00h: call [phone] 04/17/2014 5:37 PM

## 2014-04-17 NOTE — ED Notes (Signed)
Dr Ree Kida inform of pt c/o SOB.  Duo neb ordered.  presently SAT 100% prior to treatment.  Cardiac monitor shows SR 94.

## 2014-04-17 NOTE — ED Notes (Signed)
Per Shawn at [facility], No Stepdown beds at this time.

## 2014-04-17 NOTE — ED Notes (Signed)
Called [facility] with rm # at [facility].  They will send truck when available.

## 2014-04-17 NOTE — H&P (Addendum)
[facility] History and Physical  Michael Ellison ZOX:[id number] DOB: 03/22/1946 DOA: 04/17/2014  Referring physician:  PCP: Rosita Fire, MD  Specialists: Dr. Lamonte Sakai, pulmonologist  Chief Complaint: Shortness of breath  HPI: Michael Ellison is a 68 y.o. male  With a history of COPD, hypertension that presents to the emergency department for shortness of breath. Patient was brought in via EMS. He was given several nebulizer treatments as well as Solu-Medrol and placed on CPAP in route to the hospital. On arrival to emergency department patient was placed on a continuous nebulizer treatment. Patient states that over the last few days he has had more shortness of breath than usual. He does state he uses his medications as prescribed. Patient wears 2 L of nasal cannula as needed. Patient states he been using his oxygen on a regular basis due to the hot weather. He does state that he used several nebulizer treatments at home as well as his inhalers without resolution of his symptoms. Patient stated last night he became more increasingly short of breath and began to cough. He has experienced fever and chills over the last several days however does not know what his temperature was. Patient denies recent travel or sick contacts. He denies any secondhand smoke at this time. Patient is a former smoker.  Review of Systems:  Constitutional: Complains of fever and chills at home. HEENT: Denies photophobia, eye pain, redness, hearing loss, ear pain, congestion, sore throat, rhinorrhea, sneezing, mouth sores, trouble swallowing, neck pain, neck stiffness and tinnitus.   Respiratory: Complains of shortness of breath and cough.   Cardiovascular: Denies chest pain, palpitations and leg swelling.  Gastrointestinal: Denies nausea, vomiting, abdominal pain, diarrhea, constipation, blood in stool and abdominal distention.  Genitourinary: Denies dysuria, urgency, frequency, hematuria, flank pain and difficulty  urinating.  Musculoskeletal: Denies myalgias, back pain, joint swelling, arthralgias and gait problem.  Skin: Denies pallor, rash and wound.  Neurological: Denies dizziness, seizures, syncope, weakness, light-headedness, numbness and headaches.  Hematological: Denies adenopathy. Easy bruising, personal or family bleeding history  Psychiatric/Behavioral: Denies suicidal ideation, mood changes, confusion, nervousness, sleep disturbance and agitation  Past Medical History  Diagnosis Date  . Hypertension   . Cancer   . Cancer of prostate   . Depression   . Asthma   . Shortness of breath   . COPD (chronic obstructive pulmonary disease)     home O2  . Aspiration pneumonia 03/01/2012  . Hernia of abdominal cavity    Past Surgical History  Procedure Laterality Date  . Hernia repair    . Prostate biopsy    . Robotic prostate surgery  2011  . Laparotomy  02/25/2012    Procedure: EXPLORATORY LAPAROTOMY;  Surgeon: Donato Heinz, MD;  Location: [facility];  Service: General;  Laterality: N/A;  . Bowel resection  02/25/2012    Procedure: SMALL BOWEL RESECTION;  Surgeon: Donato Heinz, MD;  Location: [facility];  Service: General;;  . Wound debridement  03/02/2012    Procedure: DEBRIDEMENT CLOSURE/ABDOMINAL WOUND;  Surgeon: Gwenyth Ober, MD;  Location: [place];  Service: General;  Laterality: N/A;  . Tracheostomy  03/2012  . Ventral hernia repair  12/25/2012     Dr Hulen Skains  . Ventral hernia repair N/A 12/25/2012    Procedure: HERNIA REPAIR VENTRAL ADULT;  Surgeon: Gwenyth Ober, MD;  Location: [place];  Service: General;  Laterality: N/A;  . Insertion of mesh N/A 12/25/2012    Procedure: INSERTION OF MESH;  Surgeon: Kathryne Eriksson  Hulen Skains, MD;  Location: [place];  Service: General;  Laterality: N/A;  . Incision and drainage abscess N/A 01/15/2013    Procedure: INCISION AND DRAINAGE Abdominal Wall ABSCESS;  Surgeon: Gwenyth Ober, MD;  Location: [place];  Service: General;  Laterality: N/A;   Social History:  reports that  he quit smoking about 2 years ago. His smoking use included Cigarettes. He has a 66 pack-year smoking history. He has never used smokeless tobacco. He reports that he does not drink alcohol or use illicit drugs. Lives at home alone.  Allergies  Allergen Reactions  . Lorazepam Other (See Comments)    Severe agitated delirium. Tolerates midazolam and other benzo's    Family History  Problem Relation Age of Onset  . Pseudochol deficiency Neg Hx   . Anesthesia problems Neg Hx   . Hypotension Neg Hx   . Malignant hyperthermia Neg Hx     Prior to Admission medications   Medication Sig Start Date End Date Taking? Authorizing Provider  albuterol (PROVENTIL) (5 MG/ML) 0.5% nebulizer solution Take 2.5 mg by nebulization every 6 (six) hours. AND Q3 PRN SOB or Wheezing 04/25/12  Yes Donita Brooks, NP  Ipratropium-Albuterol (COMBIVENT RESPIMAT) 20-100 MCG/ACT AERS respimat Inhale 1 puff into the lungs every 6 (six) hours. 04/08/14  Yes Collene Gobble, MD  ipratropium-albuterol (DUONEB) 0.5-2.5 (3) MG/3ML SOLN Take 3 mLs by nebulization every 6 (six) hours as needed.   Yes Historical Provider, MD  lisinopril (PRINIVIL,ZESTRIL) 20 MG tablet Take 20 mg by mouth daily.  02/18/14  Yes Historical Provider, MD  traMADol-acetaminophen (ULTRACET) 37.5-325 MG per tablet Take 1 tablet by mouth 2 (two) times daily.   Yes Historical Provider, MD   Physical Exam: Filed Vitals:   04/17/14 1216  BP: 136/91  Pulse: 101  Temp:   Resp: 20     General: Well developed, well nourished, NAD, appears stated age  HEENT: NCAT, PERRLA, EOMI, Anicteic Sclera, BiPAP in place.  Neck: Supple, no JVD, no masses  Cardiovascular: S1 S2 auscultated, no rubs, murmurs or gallops. Regular rate and rhythm.  Respiratory: Diffuse wheezing.  Abdomen: Soft, nontender, nondistended, + bowel sounds, hernia (incisional)  Extremities: warm dry without cyanosis clubbing or edema  Neuro: AAOx3, cranial nerves grossly intact.  Strength 5/5 in patient's upper and lower extremities bilaterally  Skin: Without rashes exudates or nodules  Psych: Normal affect and demeanor with intact judgement and insight  Labs on Admission:  Basic Metabolic Panel:  Recent Labs Lab 04/17/14 0716  NA 139  K 3.9  CL 104  CO2 24  GLUCOSE 116*  BUN 13  CREATININE 0.83  CALCIUM 8.8   Liver Function Tests:  Recent Labs Lab 04/17/14 0716  AST 12  ALT 11  ALKPHOS 65  BILITOT 0.3  PROT 7.3  ALBUMIN 3.5   No results found for this basename: LIPASE, AMYLASE,  in the last 168 hours No results found for this basename: AMMONIA,  in the last 168 hours CBC:  Recent Labs Lab 04/17/14 0716  WBC 10.2  NEUTROABS 6.0  HGB 15.0  HCT 44.2  MCV 80.1  PLT 210   Cardiac Enzymes:  Recent Labs Lab 04/17/14 0716  TROPONINI <0.30    BNP (last 3 results)  Recent Labs  04/17/14 0716  PROBNP 58.1   CBG: No results found for this basename: GLUCAP,  in the last 168 hours  Radiological Exams on Admission: Dg Chest Portable 1 View  04/17/2014   CLINICAL DATA:  Shortness of Breath  EXAM: PORTABLE CHEST - 1 VIEW  COMPARISON:  December 25, 2012  FINDINGS: There is underlying emphysematous change. Bullous disease in the upper lobes is stable. There is diffuse reticular interstitial disease which is stable and probably reflects chronic inflammatory type change. There may be a mild degree of chronic congestive heart failure superimposed. There is no airspace consolidation. The heart is mildly prominent given underlying emphysema. The pulmonary vascularity reflects underlying emphysema and is stable. No adenopathy.  IMPRESSION: Underlying emphysematous change. Question mild superimposed chronic congestive heart failure versus chronic inflammatory type change in the lungs. Both entities may exist concurrently. No airspace consolidation.   Electronically Signed   By: Lowella Grip M.D.   On: 04/17/2014 07:41    EKG: Independently  reviewed. Sinus rhythm, rate 93  Assessment/Plan Acute respiratory failure secondary to COPD exacerbation -Patient required CPAP as well as several nebulizer treatments in route to the hospital via EMS -Patient will be admitted to step down unit at [facility] per family request, as patient's pulmonologist, Dr. Lamonte Sakai is a [facility] -Will place patient on Solu-Medrol, guaifenesin, DuoNeb treatments, levofloxacin. -Will continue BiPAP. -Patient had a low grade fever, will obtain respiratory viral panel. -Please consult pulmonology upon arrival to [facility]. -Chest x-ray shows underlying emphysematous changes, no consolidations, as she congestive heart failure -BNP 58, troponin negative  COPD exacerbation -Treatment and plan as above  Hypertension -Lisinopril  DVT prophylaxis: Lovenox  Code Status: Full  Condition: Guarded  Family Communication: None at bedside. Admission, patients condition and plan of care including tests being ordered have been discussed with the patient, who indicates understanding and agrees with the plan and Code Status.  Disposition Plan: Admitted to Stepdown at [facility]  Time spent: 65 minutes  Jonesha Tsuchiya D.O. [facility] Pager [phone]  If 7PM-7AM, please contact night-coverage [email or web address] Password [facility] 04/17/2014, 12:54 PM

## 2014-04-18 DIAGNOSIS — I10 Essential (primary) hypertension: Secondary | ICD-10-CM

## 2014-04-18 DIAGNOSIS — I519 Heart disease, unspecified: Secondary | ICD-10-CM

## 2014-04-18 DIAGNOSIS — D649 Anemia, unspecified: Secondary | ICD-10-CM

## 2014-04-18 DIAGNOSIS — R0902 Hypoxemia: Secondary | ICD-10-CM

## 2014-04-18 DIAGNOSIS — J9620 Acute and chronic respiratory failure, unspecified whether with hypoxia or hypercapnia: Secondary | ICD-10-CM

## 2014-04-18 DIAGNOSIS — F1011 Alcohol abuse, in remission: Secondary | ICD-10-CM

## 2014-04-18 LAB — CBC
HCT: 45.7 % (ref 39.0–52.0)
HEMOGLOBIN: 15.2 g/dL (ref 13.0–17.0)
MCH: 27.3 pg (ref 26.0–34.0)
MCHC: 33.3 g/dL (ref 30.0–36.0)
MCV: 82.2 fL (ref 78.0–100.0)
Platelets: 236 10*3/uL (ref 150–400)
RBC: 5.56 MIL/uL (ref 4.22–5.81)
RDW: 15.3 % (ref 11.5–15.5)
WBC: 13.3 10*3/uL — ABNORMAL HIGH (ref 4.0–10.5)

## 2014-04-18 LAB — BASIC METABOLIC PANEL
Anion gap: 14 (ref 5–15)
BUN: 20 mg/dL (ref 6–23)
CHLORIDE: 103 meq/L (ref 96–112)
CO2: 21 mEq/L (ref 19–32)
CREATININE: 0.8 mg/dL (ref 0.50–1.35)
Calcium: 9.1 mg/dL (ref 8.4–10.5)
GFR calc non Af Amer: >90 mL/min (ref >90)
GLUCOSE: 127 mg/dL — AB (ref 70–99)
POTASSIUM: 4.7 meq/L (ref 3.7–5.3)
Sodium: 138 mEq/L (ref 137–147)

## 2014-04-18 LAB — TROPONIN I: Troponin I: <0.3 ng/mL (ref <0.30)

## 2014-04-18 LAB — RESPIRATORY VIRUS PANEL
Adenovirus: NOT DETECTED
INFLUENZA A H1: NOT DETECTED
INFLUENZA A H3: NOT DETECTED
Influenza A: NOT DETECTED
Influenza B: NOT DETECTED
Metapneumovirus: NOT DETECTED
Parainfluenza 1: NOT DETECTED
Parainfluenza 2: NOT DETECTED
Parainfluenza 3: NOT DETECTED
RESPIRATORY SYNCYTIAL VIRUS A: NOT DETECTED
RHINOVIRUS: NOT DETECTED
Respiratory Syncytial Virus B: NOT DETECTED

## 2014-04-18 LAB — MAGNESIUM: Magnesium: 2.3 mg/dL (ref 1.5–2.5)

## 2014-04-18 LAB — PHOSPHORUS: PHOSPHORUS: 2.4 mg/dL (ref 2.3–4.6)

## 2014-04-18 MED ORDER — METHYLPREDNISOLONE SODIUM SUCC 125 MG IJ SOLR
60.0000 mg | INTRAMUSCULAR | Status: DC
  Filled 2014-04-18: qty 0.96

## 2014-04-18 NOTE — Progress Notes (Signed)
  Echocardiogram 2D Echocardiogram has been performed.  Donata Clay 04/18/2014, 2:13 PM

## 2014-04-18 NOTE — Progress Notes (Signed)
Full report given to RN on 6N. Pt being transferred to 6N-20.

## 2014-04-18 NOTE — Progress Notes (Signed)
[facility]  KANIN LIA KDT:[id number] DOB: November 10, 1945 DOA: 04/17/2014 PCP: Rosita Fire, MD  Time spent :  Brief narrative: 68 y.o. male with a history of COPD, hypertension that presented to the emergency department for shortness of breath. Patient was brought in via EMS. He was given several nebulizer treatments as well as Solu-Medrol and placed on CPAP en route to the hospital. On arrival to emergency department the patient was placed on a continuous nebulizer treatment but did not improve and eventually required BiPAP.  Patient stated that over the last few days he has had more shortness of breath than usual. He does state he uses his medications as prescribed. Patient wears 2 L of nasal cannula as needed at home. He had been using his oxygen on a regular basis due to the hot weather. He also used several nebulizer treatments at home as well as his inhalers without resolution of his symptoms. Patient stated that 24 hours before coming to the ER he became increasingly short of breath and began to cough. He had experienced fever and chills over the last several days however did not know what his temperature was. Patient denied recent travel or sick contacts. He denied any secondhand smoke at this time. Patient is a former smoker.  HPI/Subjective: Sts breathing much improved today  Assessment/Plan: Active Problems:   Acute on chronic respiratory failure with hypoxia/COPD exacerbation -improved and off BiPAP but not yet weaned to baseline 2L -appreciate pulmonary assistance: changed ACE I to ARB, added steroids and checked resp viral panel -cont nebs and other supportive care -suggested would be a good candidate for AStra ZEneca trial Phase 3 , RCT, double blind of IL5Rab Benralizumab SQ injection q4-8 weeks x 52 weeks (against eos ) v placebo in reducing COPD burden for patients with peripheral eos >/= 300. -Flutter q 4hr while awake -Titrate O2  to maintain SpO2> 90% -A.m. SpO2 ambulatory -Respiratory virus panel pending    Essential hypertension -pulmonary concerned SOB could be cardiac mediated but EKG and enzymes negative -proBNP was normal -since has COPD will ck ECHO re: ? Pulmonary HTN and/or RHF -BP controlled on new Cozaar    Alcohol abuse -listed as prior problem but denied to admitting MD current ETOH    Anemia -hgb stable and almost in polycythemic range  DVT prophylaxis: Lovenox Code Status: Full Family Communication: No family at bedside Disposition Plan/Expected LOS: Transfer to floor   Consultants: Dr. Brand Males (Pulmonary medicine)  Procedures: 2-D echocardiogram pending  Antibiotics: Levaquin 7/1 >>>  Objective: Blood pressure 118/80, pulse 104, temperature 96.9 F (36.1 C), temperature source Axillary, resp. rate 25, height 6\' 3"  (1.905 m), weight 209 lb 7 oz (95 kg), SpO2 93.00%.  Intake/Output Summary (Last 24 hours) at 04/18/14 1042 Last data filed at 04/18/14 0120  Gross per 24 hour  Intake    100 ml  Output    750 ml  Net   -650 ml     Exam: General: No acute respiratory distress Lungs: Rhonchi present entire right lung field, left lung fields clear mild diffuse expiratory wheezes Cardiovascular: Regular rate and rhythm without murmur gallop or rub normal S1 and S2, no peripheral edema or JVD Abdomen: Nontender, nondistended, soft, bowel sounds positive, no rebound, no ascites, no appreciable mass Musculoskeletal: No significant cyanosis, clubbing of bilateral lower extremities Neurological: Alert and oriented x 3, moves all extremities x 4 without focal neurological deficits, CN 2-12 intact  Scheduled  Meds:  Scheduled Meds: . albuterol  15 mg/hr Nebulization Once  . enoxaparin (LOVENOX) injection  40 mg Subcutaneous Q24H  . guaiFENesin  1,200 mg Oral BID  . ipratropium-albuterol  3 mL Nebulization Q4H  . levofloxacin (LEVAQUIN) IV  500 mg Intravenous Q24H  . losartan   50 mg Oral Daily  . methylPREDNISolone (SOLU-MEDROL) injection  60 mg Intravenous Q6H  . sodium chloride  3 mL Intravenous Q12H  . traMADol-acetaminophen  1 tablet Oral BID   Continuous Infusions:   Data Reviewed: Basic Metabolic Panel:  Recent Labs Lab 04/17/14 0716 04/17/14 1840 04/18/14 0313  NA 139  --  138  K 3.9  --  4.7  CL 104  --  103  CO2 24  --  21  GLUCOSE 116*  --  127*  BUN 13  --  20  CREATININE 0.83 0.80 0.80  CALCIUM 8.8  --  9.1  MG  --   --  2.3  PHOS  --   --  2.4   Liver Function Tests:  Recent Labs Lab 04/17/14 0716  AST 12  ALT 11  ALKPHOS 65  BILITOT 0.3  PROT 7.3  ALBUMIN 3.5   No results found for this basename: LIPASE, AMYLASE,  in the last 168 hours No results found for this basename: AMMONIA,  in the last 168 hours CBC:  Recent Labs Lab 04/17/14 0716 04/17/14 1840 04/18/14 0313  WBC 10.2 12.5* 13.3*  NEUTROABS 6.0  --   --   HGB 15.0 15.4 15.2  HCT 44.2 45.7 45.7  MCV 80.1 80.7 82.2  PLT 210 222 236   Cardiac Enzymes:  Recent Labs Lab 04/17/14 0716 04/17/14 2028 04/18/14 0313  TROPONINI <0.30 <0.30 <0.30   BNP (last 3 results)  Recent Labs  04/17/14 0716  PROBNP 58.1   CBG: No results found for this basename: GLUCAP,  in the last 168 hours  Recent Results (from the past 240 hour(s))  MRSA PCR SCREENING     Status: None   Collection Time    04/17/14  4:21 PM      Result Value Ref Range Status   MRSA by PCR NEGATIVE  NEGATIVE Final   Comment:            The GeneXpert MRSA Assay (FDA     approved for NASAL specimens     only), is one component of a     comprehensive MRSA colonization     surveillance program. It is not     intended to diagnose MRSA     infection nor to guide or     monitor treatment for     MRSA infections.     Studies:  Recent x-ray studies have been reviewed in detail by the Attending Physician       Erin Hearing, ANP [facility] Office  [phone] Pager  [phone]   **If unable to reach the above provider after paging please contact the [place] @ [phone]  On-Call/Text Page:      [email or web address]      password [email or web address]  If 7PM-7AM, please contact night-coverage [email or web address] Password [email or web address] 04/18/2014, 10:42 AM   LOS: 1 day   Examined patient discussed assessment and plan with ANP Ebony Hail and agree with above plan Discussed plan with patient to include possible admission into AStra ZEneca trial Phase 3 , RCT, double blind of IL5Rab Benralizumab SQ injection. Counseled that if she was appropriate for trial PCCM. Discussed with him.  Her medical  care greater than 40 minutes

## 2014-04-18 NOTE — Consult Note (Signed)
PULMONARY  / CRITICAL CARE MEDICINE  Name: Michael Ellison MRN: [id number] DOB: June 21, 1946 PCP Rosita Fire, MD Pulmonary Dr Lamonte Sakai   ADMISSION DATE:  04/17/2014 LOS 1 days  CONSULTATION DATE:  No comment available   REFERRING MD :  [facility] PRIMARY SERVICE: Tr[facility]  CHIEF COMPLAINT:  AECIOPD  BRIEF PATIENT DESCRIPTION:  68 y.o. y/o male, smoker, with a PMH of ETOH, HTN, Prostate Cancer admitted to [facility] 02/24/2012 with nausea / vomiting and abdominal pain secondary to small bowel obstruction due to adhesions. Underwent exploratory laparotomy with small bowel resection 5/10. Developed delirium post-op. Transferred to [facility] 02/29/12 for respiratory failure requiring intubation. Developed abdominal fascial dehiscence and taken back to OR 5/16. Failed extubation, and required tracheostomy 5/22. Weaned to [facility] 04/07/12. He was noted to have dysphagia and was evaluated by Speech Therapy and found to have a large Zenker diverticulum. He is status post Endoscopic Zenker diverticulectomy 04/18/12. Swallowing / diet was advanced post procedure. He was decanulated 8/7, PEG tube out 05/18/2012   Since fall 2013 following with Dr Angelica Chessman for COPD - gold stage 2 with dlco 71% based on OCt 2013 PFT using 2L [place] prn.   Review of chart shows several changes in mdi regimen but most recently 6/12?15 he stated  Anoro worked best but on 04/04/14 changed to combivent respimat due to high cost of anoro. Since then has had steady decline in respiratory issues and esp over  the last few days he has had more shortness of breath than usual. He does state he uses his medications as prescribed.  Patient states he been using his oxygen on a regular basis due to the hot weather. He does state that he used several nebulizer treatments at home as well as his inhalers without resolution of his symptoms. Patient stated last night he became more increasingly short of breath and began to cough. He has experienced fever and chills over the last several  days however does not know what his temperature was. Patient denies recent travel or sick contacts. He denies any secondhand smoke at this time. Patient is a former smoker. aBG at admission was normal.  However, patietn wanted transfer to [facility] and pulmonary consult  Of note his peripheral eosinophils was 500 cells at admission - marker of high risk recurrent AECOPD   At time of PCCM exam feels 50% better    LINES / TUBES: non  CULTURES: 04/17/14 - MRSA PCR  - negative 04/17/14 - urine culture -  04/17/14 - resp virus pcr panel   ANTIBIOTICS: Anti-infectives   Start     Dose/Rate Route Frequency Ordered Stop   04/17/14 1700  levofloxacin (LEVAQUIN) IVPB 500 mg     500 mg 100 mL/hr over 60 Minutes Intravenous Every 24 hours 04/17/14 1555     04/17/14 0900  levofloxacin (LEVAQUIN) IVPB 500 mg     500 mg 100 mL/hr over 60 Minutes Intravenous  Once 04/17/14 0850 04/17/14 1016        SIGNIFICANT EVENTS / STUDIES:  04/17/2014 - admit    SUBJECTIVE:    04/18/14: much improved.   VITAL SIGNS: Filed Vitals:   04/18/14 0305 04/18/14 0351 04/18/14 0809 04/18/14 1130  BP:  119/86 118/80   Pulse:  97 104   Temp:  98.2 F (36.8 C) 96.9 F (36.1 C)   TempSrc:  Oral Axillary   Resp:  25 25   Height:      Weight:      SpO2: 98% 95% 93% 95%  HEMODYNAMICS:   VENTILATOR SETTINGS: Vent Mode:  [-] PRVC FiO2 (%):  [30 %] 30 % Set Rate:  [28 bmp] 28 bmp Vt Set:  [420 mL] 420 mL PEEP:  [5 cmH20] 5 cmH20 Plateau Pressure:  [4 IHK74-25 cmH20] 20 cmH20 INTAKE / OUTPUT: I/O last 3 completed shifts: In: 100 [IV Piggyback:100] Out: 750 [Urine:750]     PHYSICAL EXAMINATION: General: Well developed, well nourished, NAD, appears stated age  HEENT: NCAT, PERRLA, EOMI, Anicteic Sclera, Off BiPAP. Has tracheostomy scar + Neck: Supple, no JVD, no masses  Cardiovascular: S1 S2 auscultated, no rubs, murmurs or gallops. Regular rate and rhythm.  Respiratory: Wheezing is 80% better .  +, ful sentences +, NO acc muscle use +. Full sentences + Abdomen: Soft, nontender, nondistended, + bowel sounds, hernia (incisional)  Extremities: warm dry without cyanosis clubbing or edema  Neuro: AAOx3, cranial nerves grossly intact. Strength 5/5 in patient's upper and lower extremities bilaterally  Skin: Without rashes exudates or nodules  Psych: Normal affect and demeanor with intact judgement and insight  LABS: PULMONARY  Recent Labs Lab 04/17/14 0840  PHART 7.410  PCO2ART 37.0  PO2ART 93.8  HCO3 23.0  TCO2 19.8  O2SAT 97.5    CBC  Recent Labs Lab 04/17/14 0716 04/17/14 1840 04/18/14 0313  HGB 15.0 15.4 15.2  HCT 44.2 45.7 45.7  WBC 10.2 12.5* 13.3*  PLT 210 222 236    COAGULATION No results found for this basename: INR,  in the last 168 hours  CARDIAC    Recent Labs Lab 04/17/14 0716 04/17/14 2028 04/18/14 0313  TROPONINI <0.30 <0.30 <0.30    Recent Labs Lab 04/17/14 0716  PROBNP 58.1     CHEMISTRY  Recent Labs Lab 04/17/14 0716 04/17/14 1840 04/18/14 0313  NA 139  --  138  K 3.9  --  4.7  CL 104  --  103  CO2 24  --  21  GLUCOSE 116*  --  127*  BUN 13  --  20  CREATININE 0.83 0.80 0.80  CALCIUM 8.8  --  9.1  MG  --   --  2.3  PHOS  --   --  2.4   Estimated Creatinine Clearance: 107.1 ml/min (by C-G formula based on Cr of 0.8).   LIVER  Recent Labs Lab 04/17/14 0716  AST 12  ALT 11  ALKPHOS 65  BILITOT 0.3  PROT 7.3  ALBUMIN 3.5     INFECTIOUS  Recent Labs Lab 04/17/14 0727  LATICACIDVEN 1.2     ENDOCRINE CBG (last 3)  No results found for this basename: GLUCAP,  in the last 72 hours       IMAGING x48h  Dg Chest Portable 1 View  04/17/2014   CLINICAL DATA:  Shortness of Breath  EXAM: PORTABLE CHEST - 1 VIEW  COMPARISON:  December 25, 2012  FINDINGS: There is underlying emphysematous change. Bullous disease in the upper lobes is stable. There is diffuse reticular interstitial disease which is stable and  probably reflects chronic inflammatory type change. There may be a mild degree of chronic congestive heart failure superimposed. There is no airspace consolidation. The heart is mildly prominent given underlying emphysema. The pulmonary vascularity reflects underlying emphysema and is stable. No adenopathy.  IMPRESSION: Underlying emphysematous change. Question mild superimposed chronic congestive heart failure versus chronic inflammatory type change in the lungs. Both entities may exist concurrently. No airspace consolidation.   Electronically Signed   By: Lowella Grip M.D.   On:  04/17/2014 07:41       ASSESSMENT / PLAN:  PULMONARY A: Acute on Chronic REspiratory Failure due to AECOPD due to     change in mdi from [facility] to Combivent,     summer heat    +/- resp virus    +/- ACE inhibitor    High peripheral eosinophils a marker of symptomatology and recurrent AECOPD  Much improved 04/18/14   P:   Continue losartan for BP (NO ACE Inhibitor) Duoneb q4h -> at dc will need ICs + LABA + LAMA (dulera/symbi + spiriva/tudorza)  OR, (QVAR/Flovent + ANORO)  IV steroids through 04/19/14 and then totral 12-14 day pred course - Agree with levaquin O2 for pulse ox > 88% DC BiPAP  Await resp virus panel    Will be likely be a good candidate for AStra ZEneca trial Phase 3 , RCT, double blind of IL5Rab Benralizumab SQ injection q4-8 weeks x 52 weeks (against eos ) v placebo in reducing COPD burden for patients with peripheral eos >/= 300.   NOt approached patient on this subject    Daughters updated 04/17/14   PCCM will see 04/19/14  Dr. Brand Males, M.D., [facility].C.P Pulmonary and Critical Care Medicine Staff Physician [place] [facility] Pager: [phone], If no answer or between  15:00h - 7:00h: call [phone] 04/18/2014 12:41 PM

## 2014-04-18 NOTE — Progress Notes (Signed)
Utilization review completed. Kmya Placide, RN, BSN. 

## 2014-04-19 ENCOUNTER — Inpatient Hospital Stay (HOSPITAL_COMMUNITY): Payer: Medicare Other

## 2014-04-19 LAB — URINE CULTURE: Colony Count: 60000

## 2014-04-19 LAB — PHOSPHORUS: PHOSPHORUS: 2.5 mg/dL (ref 2.3–4.6)

## 2014-04-19 LAB — MAGNESIUM: Magnesium: 2.4 mg/dL (ref 1.5–2.5)

## 2014-04-19 MED ORDER — BUDESONIDE-FORMOTEROL FUMARATE 160-4.5 MCG/ACT IN AERO: 2.0000 | INHALATION_SPRAY | Freq: Two times a day (BID) | RESPIRATORY_TRACT | Status: DC

## 2014-04-19 MED ORDER — TIOTROPIUM BROMIDE MONOHYDRATE 18 MCG IN CAPS: 18.0000 ug | ORAL_CAPSULE | Freq: Every day | RESPIRATORY_TRACT | Status: DC

## 2014-04-19 MED ORDER — PREDNISONE 10 MG PO TABS
10.0000 mg | ORAL_TABLET | Freq: Every day | ORAL | Status: DC
  Administered 2014-04-19 – 2014-04-20 (×2): 10 mg via ORAL
  Filled 2014-04-19 (×3): qty 1

## 2014-04-19 MED ORDER — ALBUTEROL SULFATE (2.5 MG/3ML) 0.083% IN NEBU: 2.5000 mg | INHALATION_SOLUTION | RESPIRATORY_TRACT | Status: DC | PRN

## 2014-04-19 MED ORDER — IPRATROPIUM-ALBUTEROL 0.5-2.5 (3) MG/3ML IN SOLN
3.0000 mL | Freq: Four times a day (QID) | RESPIRATORY_TRACT | Status: DC
Start: 2014-04-19 — End: 2014-04-20
  Administered 2014-04-19 – 2014-04-20 (×5): 3 mL via RESPIRATORY_TRACT
  Filled 2014-04-19 (×5): qty 3

## 2014-04-19 MED ORDER — TIOTROPIUM BROMIDE MONOHYDRATE 18 MCG IN CAPS
18.0000 ug | ORAL_CAPSULE | Freq: Every day | RESPIRATORY_TRACT | Status: DC
  Administered 2014-04-19 – 2014-04-20 (×2): 18 ug via RESPIRATORY_TRACT
  Filled 2014-04-19: qty 5

## 2014-04-19 MED ORDER — BUDESONIDE-FORMOTEROL FUMARATE 160-4.5 MCG/ACT IN AERO
2.0000 | INHALATION_SPRAY | Freq: Two times a day (BID) | RESPIRATORY_TRACT | Status: DC
  Administered 2014-04-19 – 2014-04-20 (×3): 2 via RESPIRATORY_TRACT
  Filled 2014-04-19: qty 6

## 2014-04-19 NOTE — Progress Notes (Signed)
Chaplain visited with Michael Ellison while making rounds. Michael Ellison seemed enthused and glad he was cleared to leave in the morning. After a brief conversation Michael Ellison expressed a desire for prayer for "strength" that he go home and enjoy the weekend. After prayer Michael Ellison stated "Im really glad you dropped by to holler at me."  Michael Ellison

## 2014-04-19 NOTE — Progress Notes (Signed)
PULMONARY  / CRITICAL CARE MEDICINE  Name: Michael Ellison MRN: [id number] DOB: [birth year] PCP Rosita Fire, MD Pulmonary Dr Lamonte Sakai   ADMISSION DATE:  04/17/2014 LOS 2 days  CONSULTATION DATE:  No comment available   REFERRING MD :  TR[facility] PRIMARY SERVICE: TrH  CHIEF COMPLAINT:  AECIOPD  BRIEF PATIENT DESCRIPTION:  68 y.o. y/o male, smoker, with a PMH of ETOH, HTN, Prostate Cancer admitted to [facility] 02/24/2012 with nausea / vomiting and abdominal pain secondary to small bowel obstruction due to adhesions. Underwent exploratory laparotomy with small bowel resection 5/10. Developed delirium post-op. Transferred to [facility] 02/29/12 for respiratory failure requiring intubation. Developed abdominal fascial dehiscence and taken back to OR 5/16. Failed extubation, and required tracheostomy 5/22. Weaned to [facility] 04/07/12. He was noted to have dysphagia and was evaluated by Speech Therapy and found to have a large Zenker diverticulum. He is status post Endoscopic Zenker diverticulectomy 04/18/12. Swallowing / diet was advanced post procedure. He was decanulated 8/7, PEG tube out 05/18/2012   Since fall 2013 following with Dr Angelica Chessman for COPD - gold stage 2 with dlco 71% based on OCt 2013 PFT using 2L [place] prn.   Review of chart shows several changes in mdi regimen but most recently 6/12?15 he stated  Anoro worked best but on 04/04/14 changed to combivent respimat due to high cost of anoro. Since then has had steady decline in respiratory issues and esp over  the last few days he has had more shortness of breath than usual. He does state he uses his medications as prescribed.  Patient states he been using his oxygen on a regular basis due to the hot weather. He does state that he used several nebulizer treatments at home as well as his inhalers without resolution of his symptoms. Patient stated last night he became more increasingly short of breath and began to cough. He has experienced fever and chills over the last several  days however does not know what his temperature was. Patient denies recent travel or sick contacts. He denies any secondhand smoke at this time. Patient is a former smoker. aBG at admission was normal.  However, patietn wanted transfer to [facility] and pulmonary consult  Of note his peripheral eosinophils was 500 cells at admission - marker of high risk recurrent AECOPD   LINES / TUBES: non  CULTURES: 04/17/14 - MRSA PCR  - negative 04/17/14 - urine culture - Neg 04/17/14 - resp virus pcr panel-Neg   ANTIBIOTICS: Anti-infectives   Start     Dose/Rate Route Frequency Ordered Stop   04/17/14 1700  levofloxacin (LEVAQUIN) IVPB 500 mg     500 mg 100 mL/hr over 60 Minutes Intravenous Every 24 hours 04/17/14 1555     04/17/14 0900  levofloxacin (LEVAQUIN) IVPB 500 mg     500 mg 100 mL/hr over 60 Minutes Intravenous  Once 04/17/14 0850 04/17/14 1016        SIGNIFICANT EVENTS / STUDIES:  04/17/2014 - admit  SUBJECTIVE:  7/3-Still more than baseline chest tightness and rattle. He thinks he got worse with change from Anoro(too expensive) to Combivent Respimat.    04/18/14: much improved.   VITAL SIGNS: Filed Vitals:   04/18/14 2318 04/19/14 0235 04/19/14 0542 04/19/14 0822  BP:  121/78 120/83   Pulse:  84 73   Temp:  97.3 F (36.3 C) 97.4 F (36.3 C)   TempSrc:  Oral Oral   Resp:  20 20   Height:      Weight:  SpO2: 92% 99% 96% 97%      HEMODYNAMICS:   VENTILATOR SETTINGS: Vent Mode:  [-] PRVC FiO2 (%):  [30 %] 30 % Set Rate:  [28 bmp] 28 bmp Vt Set:  [420 mL] 420 mL PEEP:  [5 cmH20] 5 cmH20 Plateau Pressure:  [4 UEA54-09 cmH20] 20 cmH20 INTAKE / OUTPUT: I/O last 3 completed shifts: In: 0  Out: 1250 [Urine:1250]    PHYSICAL EXAMINATION: General: Well developed, well nourished, NAD, appears stated age, sitting edge of bed eating HEENT: NCAT, PERRLA, EOMI, Anicteic Sclera, Off BiPAP. Has tracheostomy scar + Neck: Supple, no JVD, no masses  Cardiovascular: S1 S2  auscultated, no rubs, murmurs or gallops. Regular rate and rhythm.  Respiratory:  +, full sentences +, NO acc muscle use +. Coarse sounds/ mild wheeze Abdomen: Soft, nontender, nondistended, + bowel sounds, hernia (incisional)  Extremities: warm dry without cyanosis clubbing or edema  Neuro: AAOx3, cranial nerves grossly intact.   Skin: Without rashes exudates or nodules  Psych: Normal affect and demeanor with intact judgement and insight  LABS: PULMONARY  Recent Labs Lab 04/17/14 0840  PHART 7.410  PCO2ART 37.0  PO2ART 93.8  HCO3 23.0  TCO2 19.8  O2SAT 97.5    CBC  Recent Labs Lab 04/17/14 0716 04/17/14 1840 04/18/14 0313  HGB 15.0 15.4 15.2  HCT 44.2 45.7 45.7  WBC 10.2 12.5* 13.3*  PLT 210 222 236    COAGULATION No results found for this basename: INR,  in the last 168 hours  CARDIAC    Recent Labs Lab 04/17/14 0716 04/17/14 2028 04/18/14 0313 04/18/14 1227  TROPONINI <0.30 <0.30 <0.30 <0.30    Recent Labs Lab 04/17/14 0716  PROBNP 58.1     CHEMISTRY  Recent Labs Lab 04/17/14 0716 04/17/14 1840 04/18/14 0313 04/19/14 0500  NA 139  --  138  --   K 3.9  --  4.7  --   CL 104  --  103  --   CO2 24  --  21  --   GLUCOSE 116*  --  127*  --   BUN 13  --  20  --   CREATININE 0.83 0.80 0.80  --   CALCIUM 8.8  --  9.1  --   MG  --   --  2.3 2.4  PHOS  --   --  2.4 2.5   Estimated Creatinine Clearance: 107.1 ml/min (by C-G formula based on Cr of 0.8).   LIVER  Recent Labs Lab 04/17/14 0716  AST 12  ALT 11  ALKPHOS 65  BILITOT 0.3  PROT 7.3  ALBUMIN 3.5     INFECTIOUS  Recent Labs Lab 04/17/14 0727  LATICACIDVEN 1.2     ENDOCRINE CBG (last 3)  No results found for this basename: GLUCAP,  in the last 72 hours   IMAGING x48h  No results found.  ASSESSMENT / PLAN:  PULMONARY A: Acute on Chronic REspiratory Failure due to AECOPD due to     coincident with change mdi from [facility] to Combivent. Has used Symbicort in  past    summer heat    +/- resp virus -not identified on panel   +/- ACE inhibitor- may aggravate but not primary cause    High peripheral eosinophils a marker of symptomatology and recurrent AECOPD   P:   Continue losartan for BP (NO ACE Inhibitor) Duoneb q4h -> at dc will need ICs + LABA + LAMA i.e. (dulera or symbicort 180 + spiriva or tudorza)  He can continue DUONEB 4 x daily maintenance, with albuterol neb in between every 2 hrs, only if needed. IV steroids through 04/19/14 and then total 12-14 day pred course - ordered Agree with levaquin O2 for pulse ox > 88% DC BiPAP  Downstairs for 2v CXR today- ordered  For PCCM Office f/u-  Will be likely be a good candidate for AStra ZEneca trial Phase 3 , RCT, double blind of IL5Rab Benralizumab SQ injection q4-8 weeks x 52 weeks (against eos ) v placebo in reducing COPD burden for patients with peripheral eos >/= 300.   NOt approached patient on this subject     CD Kleo Dungee, MD Pulmonary and Critical Care Medicine  Pager: [phone], If no answer or between  15:00h - 7:00h: call [phone] 04/19/2014 8:25 AM

## 2014-04-19 NOTE — Progress Notes (Signed)
Walked in patient room to do bedside reporting with on coming night nurse and found that patient continous pulse ox was reading 73 but fluctuates back to 93 and above. Patient wasn't showing any signs of distress or difficulty breathing. Night nurse will continue to follow up with patient.

## 2014-04-20 LAB — MAGNESIUM: Magnesium: 2.1 mg/dL (ref 1.5–2.5)

## 2014-04-20 LAB — PHOSPHORUS: PHOSPHORUS: 2 mg/dL — AB (ref 2.3–4.6)

## 2014-04-20 MED ORDER — TIOTROPIUM BROMIDE MONOHYDRATE 18 MCG IN CAPS: 18.0000 ug | ORAL_CAPSULE | Freq: Every day | RESPIRATORY_TRACT | Status: DC

## 2014-04-20 MED ORDER — BUDESONIDE-FORMOTEROL FUMARATE 160-4.5 MCG/ACT IN AERO: 2.0000 | INHALATION_SPRAY | Freq: Two times a day (BID) | RESPIRATORY_TRACT | Status: DC

## 2014-04-20 MED ORDER — LEVOFLOXACIN 500 MG PO TABS: 500.0000 mg | ORAL_TABLET | Freq: Every day | ORAL | Status: DC

## 2014-04-20 MED ORDER — PREDNISONE 10 MG PO TABS: 10.0000 mg | ORAL_TABLET | Freq: Every day | ORAL | Status: DC

## 2014-04-20 MED ORDER — LOSARTAN POTASSIUM 50 MG PO TABS: 50.0000 mg | ORAL_TABLET | Freq: Every day | ORAL | Status: DC

## 2014-04-20 NOTE — Discharge Instructions (Signed)
Follow with Primary MD FANTA,TESFAYE, MD in 7 days   Get CBC, CMP, 2 view Chest X ray checked  by Primary MD next visit.    Activity: As tolerated with Full fall precautions use walker/cane & assistance as needed   Disposition Home     Diet: Heart Healthy   For Heart failure patients - Check your Weight same time everyday, if you gain over 2 pounds, or you develop in leg swelling, experience more shortness of breath or chest pain, call your Primary MD immediately. Follow Cardiac Low Salt Diet and 1.8 lit/day fluid restriction.   On your next visit with her primary care physician please Get Medicines reviewed and adjusted.  Please request your Prim.MD to go over all Hospital Tests and Procedure/Radiological results at the follow up, please get all Hospital records sent to your Prim MD by signing hospital release before you go home.   If you experience worsening of your admission symptoms, develop shortness of breath, life threatening emergency, suicidal or homicidal thoughts you must seek medical attention immediately by calling 911 or calling your MD immediately  if symptoms less severe.  You Must read complete instructions/literature along with all the possible adverse reactions/side effects for all the Medicines you take and that have been prescribed to you. Take any new Medicines after you have completely understood and accpet all the possible adverse reactions/side effects.   Do not drive, operating heavy machinery, perform activities at heights, swimming or participation in water activities or provide baby sitting services if your were admitted for syncope or siezures until you have seen by Primary MD or a Neurologist and advised to do so again.  Do not drive when taking Pain medications.    Do not take more than prescribed Pain, Sleep and Anxiety Medications  Special Instructions: If you have smoked or chewed Tobacco  in the last 2 yrs please stop smoking, stop any regular  Alcohol  and or any Recreational drug use.  Wear Seat belts while driving.   Please note  You were cared for by a hospitalist during your hospital stay. If you have any questions about your discharge medications or the care you received while you were in the hospital after you are discharged, you can call the unit and asked to speak with the hospitalist on call if the hospitalist that took care of you is not available. Once you are discharged, your primary care physician will handle any further medical issues. Please note that NO REFILLS for any discharge medications will be authorized once you are discharged, as it is imperative that you return to your primary care physician (or establish a relationship with a primary care physician if you do not have one) for your aftercare needs so that they can reassess your need for medications and monitor your lab values.

## 2014-04-20 NOTE — Discharge Summary (Signed)
Michael Ellison, is a 68 y.o. male  DOB [birth year]  MRN [id number].  Admission date:  04/17/2014  Admitting Physician  Michael Ford, DO  Discharge Date:  04/20/2014   Primary MD  Michael Fire, MD  Recommendations for primary care physician for things to follow:   Repeat CBC, BMP and a 2 view CXR in 1 week   Admission Diagnosis  COPD with exacerbation [491.21] Incisional hernia, without obstruction or gangrene [553.21]   Discharge Diagnosis  COPD with exacerbation [491.21] Incisional hernia, without obstruction or gangrene [553.21]    Active Problems:   Alcohol abuse   Anemia   COPD exacerbation   Essential hypertension   Acute on chronic respiratory failure with hypoxia      Past Medical History  Diagnosis Date  . Hypertension   . Cancer   . Cancer of prostate   . Depression   . Asthma   . Shortness of breath   . COPD (chronic obstructive pulmonary disease)     home O2  . Aspiration pneumonia 03/01/2012  . Hernia of abdominal cavity     Past Surgical History  Procedure Laterality Date  . Hernia repair    . Prostate biopsy    . Robotic prostate surgery  2011  . Laparotomy  02/25/2012    Procedure: EXPLORATORY LAPAROTOMY;  Surgeon: Michael Heinz, MD;  Location: [facility];  Service: General;  Laterality: N/A;  . Bowel resection  02/25/2012    Procedure: SMALL BOWEL RESECTION;  Surgeon: Michael Heinz, MD;  Location: [facility];  Service: General;;  . Wound debridement  03/02/2012    Procedure: DEBRIDEMENT CLOSURE/ABDOMINAL WOUND;  Surgeon: Michael Ober, MD;  Location: [place];  Service: General;  Laterality: N/A;  . Tracheostomy  03/2012  . Ventral hernia repair  12/25/2012     Dr Michael Ellison  . Ventral hernia repair N/A 12/25/2012    Procedure: HERNIA REPAIR VENTRAL ADULT;  Surgeon: Michael Ober, MD;  Location: [place];   Service: General;  Laterality: N/A;  . Insertion of mesh N/A 12/25/2012    Procedure: INSERTION OF MESH;  Surgeon: Michael Ober, MD;  Location: [place];  Service: General;  Laterality: N/A;  . Incision and drainage abscess N/A 01/15/2013    Procedure: INCISION AND DRAINAGE Abdominal Wall ABSCESS;  Surgeon: Michael Ober, MD;  Location: [place];  Service: General;  Laterality: N/A;       History of present illness and  Hospital Course:     Kindly see H&P for history of present illness and admission details, please review complete Labs, Consult reports and Test reports for all details in brief  HPI  Michael Ellison is a 68 y.o. male with a history of COPD, hypertension that presents to the emergency department for shortness of breath. Patient was brought in via EMS. He was given several nebulizer treatments as well as Solu-Medrol and placed on CPAP in route to the hospital. On arrival to emergency department patient was placed on a continuous nebulizer  treatment. Patient states that over the last few days he has had more shortness of breath than usual. He does state he uses his medications as prescribed. Patient wears 2 L of nasal cannula as needed. Patient states he been using his oxygen on a regular basis due to the hot weather. He does state that he used several nebulizer treatments at home as well as his inhalers without resolution of his symptoms. Patient stated last night he became more increasingly short of breath and began to cough. He has experienced fever and chills over the last several days however does not know what his temperature was. Patient denies recent travel or sick contacts. He denies any secondhand smoke at this time. Patient is a former smoker.     Hospital Course    Acute on chronic respiratory failure with hypoxia/COPD exacerbation  -improved and off BiPAP but not yet weaned to baseline 2L  -appreciate pulmonary assistance: changed ACE I to ARB, added steroids (PO and  Inhaled) and checked resp viral panel which was -ve -Seen by PCCM, Meds adjusted and finalized by PCCM, now stable infact on RA at rest for the 24hrs he feels at baseline. - Will be DC'd on 104m PO daily Prednisone as per PCCM, 4 more days of Levaquin along with inhaled Meds and Nebs as below.    Essential hypertension  -stable on Meds as above, switched to ARB, Trop -ve x 3, Echo shows Chr Diastloc CHF without any elevation in ProBNP or edema, Ef 60%, No Pulm HTN.    H/O Smoking and Alcohol abuse  -listed as prior problem but denied to admitting MD current ETOH or smoking, requested to continue to abstain.    Anemia  -hgb stable and almost in polycythemic range, outpt follow by PCP.    H/O Hernia - outpt follow by PCP.       Discharge Condition: Stable   Follow UP  Follow-up Information   Follow up with FANTA,TESFAYE, MD. Schedule an appointment as soon as possible for a visit in 1 week.   Specialty:  Internal Medicine   Contact information:   [id number] [phone] [phone]      Follow up with BCollene Gobble, MD. Schedule an appointment as soon as possible for a visit in 1 week.   Specialty:  Pulmonary Disease   Contact information:   [address] [phone]        Discharge Instructions  and  Discharge Medications          Discharge Instructions   Diet - low sodium heart healthy    Complete by:  As directed      Discharge instructions    Complete by:  As directed   Follow with Primary MD FANTA,TESFAYE, MD in 7 days   Get CBC, CMP, 2 view Chest X ray checked  by Primary MD next visit.    Activity: As tolerated with Full fall precautions use walker/cane & assistance as needed   Disposition Home     Diet: Heart Healthy   For Heart failure patients - Check your Weight same time everyday, if you gain over 2 pounds, or you develop in leg swelling, experience more shortness of breath or  chest pain, call your Primary MD immediately. Follow Cardiac Low Salt Diet and 1.8 lit/day fluid restriction.   On your next visit with her primary care physician please Get Medicines reviewed and adjusted.  Please request your Prim.MD to  go over all Hospital Tests and Procedure/Radiological results at the follow up, please get all Hospital records sent to your Prim MD by signing hospital release before you go home.   If you experience worsening of your admission symptoms, develop shortness of breath, life threatening emergency, suicidal or homicidal thoughts you must seek medical attention immediately by calling 911 or calling your MD immediately  if symptoms less severe.  You Must read complete instructions/literature along with all the possible adverse reactions/side effects for all the Medicines you take and that have been prescribed to you. Take any new Medicines after you have completely understood and accpet all the possible adverse reactions/side effects.   Do not drive, operating heavy machinery, perform activities at heights, swimming or participation in water activities or provide baby sitting services if your were admitted for syncope or siezures until you have seen by Primary MD or a Neurologist and advised to do so again.  Do not drive when taking Pain medications.    Do not take more than prescribed Pain, Sleep and Anxiety Medications  Special Instructions: If you have smoked or chewed Tobacco  in the last 2 yrs please stop smoking, stop any regular Alcohol  and or any Recreational drug use.  Wear Seat belts while driving.   Please note  You were cared for by a hospitalist during your hospital stay. If you have any questions about your discharge medications or the care you received while you were in the hospital after you are discharged, you can call the unit and asked to speak with the hospitalist on call if the hospitalist that took care of you is not available. Once you  are discharged, your primary care physician will handle any further medical issues. Please note that NO REFILLS for any discharge medications will be authorized once you are discharged, as it is imperative that you return to your primary care physician (or establish a relationship with a primary care physician if you do not have one) for your aftercare needs so that they can reassess your need for medications and monitor your lab values.  Follow with Primary MD FANTA,TESFAYE, MD in 7 days   Get CBC, CMP, 2 view Chest X ray checked  by Primary MD next visit.    Activity: As tolerated with Full fall precautions use walker/cane & assistance as needed   Disposition Home     Diet: Heart Healthy   For Heart failure patients - Check your Weight same time everyday, if you gain over 2 pounds, or you develop in leg swelling, experience more shortness of breath or chest pain, call your Primary MD immediately. Follow Cardiac Low Salt Diet and 1.8 lit/day fluid restriction.   On your next visit with her primary care physician please Get Medicines reviewed and adjusted.  Please request your Prim.MD to go over all Hospital Tests and Procedure/Radiological results at the follow up, please get all Hospital records sent to your Prim MD by signing hospital release before you go home.   If you experience worsening of your admission symptoms, develop shortness of breath, life threatening emergency, suicidal or homicidal thoughts you must seek medical attention immediately by calling 911 or calling your MD immediately  if symptoms less severe.  You Must read complete instructions/literature along with all the possible adverse reactions/side effects for all the Medicines you take and that have been prescribed to you. Take any new Medicines after you have completely understood and accpet all the possible adverse reactions/side effects.  Do not drive, operating heavy machinery, perform activities at heights,  swimming or participation in water activities or provide baby sitting services if your were admitted for syncope or siezures until you have seen by Primary MD or a Neurologist and advised to do so again.  Do not drive when taking Pain medications.    Do not take more than prescribed Pain, Sleep and Anxiety Medications  Special Instructions: If you have smoked or chewed Tobacco  in the last 2 yrs please stop smoking, stop any regular Alcohol  and or any Recreational drug use.  Wear Seat belts while driving.   Please note  You were cared for by a hospitalist during your hospital stay. If you have any questions about your discharge medications or the care you received while you were in the hospital after you are discharged, you can call the unit and asked to speak with the hospitalist on call if the hospitalist that took care of you is not available. Once you are discharged, your primary care physician will handle any further medical issues. Please note that NO REFILLS for any discharge medications will be authorized once you are discharged, as it is imperative that you return to your primary care physician (or establish a relationship with a primary care physician if you do not have one) for your aftercare needs so that they can reassess your need for medications and monitor your lab values.     Increase activity slowly    Complete by:  As directed             Medication List    STOP taking these medications       lisinopril 20 MG tablet  Commonly known as:  PRINIVIL,ZESTRIL      TAKE these medications       albuterol (5 MG/ML) 0.5% nebulizer solution  Commonly known as:  PROVENTIL  Take 2.5 mg by nebulization every 6 (six) hours. AND Q3 PRN SOB or Wheezing     budesonide-formoterol 160-4.5 MCG/ACT inhaler  Commonly known as:  SYMBICORT  Inhale 2 puffs into the lungs 2 (two) times daily.     Ipratropium-Albuterol 20-100 MCG/ACT Aers respimat  Commonly known as:  COMBIVENT  RESPIMAT  Inhale 1 puff into the lungs every 6 (six) hours.     ipratropium-albuterol 0.5-2.5 (3) MG/3ML Soln  Commonly known as:  DUONEB  Take 3 mLs by nebulization every 6 (six) hours as needed.     levofloxacin 500 MG tablet  Commonly known as:  LEVAQUIN  Take 1 tablet (500 mg total) by mouth daily.     losartan 50 MG tablet  Commonly known as:  COZAAR  Take 1 tablet (50 mg total) by mouth daily.     predniSONE 10 MG tablet  Commonly known as:  DELTASONE  Take 1 tablet (10 mg total) by mouth daily with breakfast. Stop as per Dr Lamonte Sakai     tiotropium 18 MCG inhalation capsule  Commonly known as:  SPIRIVA  Place 1 capsule (18 mcg total) into inhaler and inhale daily.     traMADol-acetaminophen 37.5-325 MG per tablet  Commonly known as:  ULTRACET  Take 1 tablet by mouth 2 (two) times daily.          Diet and Activity recommendation: See Discharge Instructions above   Consults obtained - PCCM   Major procedures and Radiology Reports - PLEASE review detailed and final reports for all details, in brief -       Dg Chest  2 View  04/19/2014   CLINICAL DATA:  copd exacerbation  EXAM: CHEST  2 VIEW  COMPARISON:  Portal chest radiograph 04/17/2014  FINDINGS: Cardiac silhouette is enlarged. Linear areas of density within the lung bases left greater than right. Bullous changes within the lung apices. No focal regions of consolidation or focal infiltrates. No acute osseous abnormalities. On  IMPRESSION: COPD with bullous changes in the lung apices.  Scarring versus atelectasis in the lung bases. No focal regions of consolidation.   Electronically Signed   By: Margaree Mackintosh M.D.   On: 04/19/2014 09:58   Dg Chest Portable 1 View  04/17/2014   CLINICAL DATA:  Shortness of Breath  EXAM: PORTABLE CHEST - 1 VIEW  COMPARISON:  December 25, 2012  FINDINGS: There is underlying emphysematous change. Bullous disease in the upper lobes is stable. There is diffuse reticular interstitial disease  which is stable and probably reflects chronic inflammatory type change. There may be a mild degree of chronic congestive heart failure superimposed. There is no airspace consolidation. The heart is mildly prominent given underlying emphysema. The pulmonary vascularity reflects underlying emphysema and is stable. No adenopathy.  IMPRESSION: Underlying emphysematous change. Question mild superimposed chronic congestive heart failure versus chronic inflammatory type change in the lungs. Both entities may exist concurrently. No airspace consolidation.   Electronically Signed   By: Lowella Grip M.D.   On: 04/17/2014 07:41    Micro Results      Recent Results (from the past 240 hour(s))  URINE CULTURE     Status: None   Collection Time    04/17/14  4:20 PM      Result Value Ref Range Status   Specimen Description URINE, CLEAN CATCH   Final   Special Requests NONE   Final   Culture  Setup Time     Final   Value: 04/17/2014 17:13     Performed at [place]     Final   Value: 60,000 COLONIES/ML     Performed at [facility]   Culture     Final   Value: Multiple bacterial morphotypes present, none predominant. Suggest appropriate recollection if clinically indicated.     Performed at [facility]   Report Status 04/19/2014 FINAL   Final  RESPIRATORY VIRUS PANEL     Status: None   Collection Time    04/17/14  4:20 PM      Result Value Ref Range Status   Source - [facility] NASAL SWAB   Corrected   Comment: CORRECTED ON 07/02 AT 1900: PREVIOUSLY REPORTED AS NASAL SWAB   Respiratory Syncytial Virus A NOT DETECTED   Final   Respiratory Syncytial Virus B NOT DETECTED   Final   Influenza A NOT DETECTED   Final   Influenza B NOT DETECTED   Final   Parainfluenza 1 NOT DETECTED   Final   Parainfluenza 2 NOT DETECTED   Final   Parainfluenza 3 NOT DETECTED   Final   Metapneumovirus NOT DETECTED   Final   Rhinovirus NOT DETECTED   Final   Adenovirus NOT  DETECTED   Final   Influenza A H1 NOT DETECTED   Final   Influenza A H3 NOT DETECTED   Final   Comment: (NOTE)           Normal Reference Range for each Analyte: NOT DETECTED     Testing performed using the Luminex xTAG Respiratory Viral Panel test  kit.     This test was developed and its performance characteristics determined     by [facility]. It has not been cleared or approved by the [place]     Food and Drug Administration. This test is used for clinical purposes.     It should not be regarded as investigational or for research. This     laboratory is certified under the [place] (CLIA) as qualified to perform high complexity     clinical laboratory testing.     Performed at [place] PCR SCREENING     Status: None   Collection Time    04/17/14  4:21 PM      Result Value Ref Range Status   MRSA by PCR NEGATIVE  NEGATIVE Final   Comment:            The GeneXpert MRSA Assay (FDA     approved for NASAL specimens     only), is one component of a     comprehensive MRSA colonization     surveillance program. It is not     intended to diagnose MRSA     infection nor to guide or     monitor treatment for     MRSA infections.       Today   Subjective:   Michael Ellison today has no headache,no chest abdominal pain,no new weakness tingling or numbness, feels much better wants to go home today.   Objective:   Blood pressure 129/86, pulse 80, temperature 97.5 F (36.4 C), temperature source Oral, resp. rate 18, height '6\' 3"'  (1.905 m), weight 95 kg (209 lb 7 oz), SpO2 99.00% RA.   Intake/Output Summary (Last 24 hours) at 04/20/14 0738 Last data filed at 04/20/14 0505  Gross per 24 hour  Intake   1080 ml  Output   2200 ml  Net  -1120 ml    Exam Awake Alert, Oriented x 3, No new F.N deficits, Normal affect [place].AT,PERRAL Supple Neck,No JVD, No cervical lymphadenopathy appriciated.  Symmetrical Chest  wall movement, Good air movement bilaterally, Minimal wheezing RRR,No Gallops,Rubs or new Murmurs, No Parasternal Heave +ve B.Sounds, Abd Soft, Non tender, No organomegaly appriciated, No rebound -guarding or rigidity. No Cyanosis, Clubbing or edema, No new Rash or bruise  Data Review   CBC w Diff: Lab Results  Component Value Date   WBC 13.3* 04/18/2014   HGB 15.2 04/18/2014   HCT 45.7 04/18/2014   PLT 236 04/18/2014   LYMPHOPCT 31 04/17/2014   MONOPCT 5 04/17/2014   EOSPCT 5 04/17/2014   BASOPCT 1 04/17/2014    CMP: Lab Results  Component Value Date   NA 138 04/18/2014   K 4.7 04/18/2014   CL 103 04/18/2014   CO2 21 04/18/2014   BUN 20 04/18/2014   CREATININE 0.80 04/18/2014   PROT 7.3 04/17/2014   ALBUMIN 3.5 04/17/2014   BILITOT 0.3 04/17/2014   ALKPHOS 65 04/17/2014   AST 12 04/17/2014   ALT 11 04/17/2014  .   Total Time in preparing paper work, data evaluation and todays exam - 35 minutes  Thurnell Lose M.D on 04/20/2014 at 7:38 AM  [facility] Office  [phone]   **Disclaimer: This note may have been dictated with voice recognition software. Similar sounding words can inadvertently be transcribed and this note may contain transcription errors which may not have been corrected upon publication of note.**

## 2014-04-22 ENCOUNTER — Telehealth: Payer: Self-pay | Admitting: Emergency Medicine

## 2014-04-22 MED ORDER — TIOTROPIUM BROMIDE MONOHYDRATE 18 MCG IN CAPS: 18.0000 ug | ORAL_CAPSULE | Freq: Every day | RESPIRATORY_TRACT | Status: DC

## 2014-04-22 NOTE — Telephone Encounter (Signed)
Called spoke with pt spouse. Aware samples left for pick up along with PA paperwork. Nothing further needed

## 2014-05-07 ENCOUNTER — Telehealth: Payer: Self-pay | Admitting: Emergency Medicine

## 2014-05-07 NOTE — Telephone Encounter (Signed)
Called spoke with Anna-a nurse. Made her aware of pt immunization chart of which pt was giving. Nothing further needed

## 2014-05-23 ENCOUNTER — Other Ambulatory Visit: Payer: Self-pay | Admitting: Internal Medicine

## 2014-08-21 ENCOUNTER — Ambulatory Visit: Payer: Medicare Other | Admitting: Emergency Medicine

## 2014-09-05 ENCOUNTER — Ambulatory Visit (INDEPENDENT_AMBULATORY_CARE_PROVIDER_SITE_OTHER): Payer: Medicare Other | Admitting: Emergency Medicine

## 2014-09-05 ENCOUNTER — Encounter: Payer: Self-pay | Admitting: Emergency Medicine

## 2014-09-05 VITALS — BP 134/76 | HR 99 | Ht 75.0 in | Wt 214.0 lb

## 2014-09-05 DIAGNOSIS — J410 Simple chronic bronchitis: Secondary | ICD-10-CM

## 2014-09-05 DIAGNOSIS — Z23 Encounter for immunization: Secondary | ICD-10-CM

## 2014-09-05 NOTE — Patient Instructions (Signed)
Please continue your Spiriva as you have been taking it Use albuterol as needed Flu shot today  You need to wear her oxygen as needed in order to keep your saturations greater than 90%. If you're doing enough to drop below 90% and you should have had your oxygen on Follow with Dr Lamonte Sakai in 6 months or sooner if you have any problems

## 2014-09-05 NOTE — Progress Notes (Signed)
Subjective:    Patient ID: Michael Ellison, male    DOB: 04-Oct-1946, 68 y.o.   MRN: [id number]  HPI 68 y.o. y/o male, smoker, with a PMH of ETOH, HTN, Prostate Cancer admitted to [facility] 02/24/2012 with nausea / vomiting and abdominal pain secondary to small bowel obstruction due to adhesions. Underwent exploratory laparotomy with small bowel resection 5/10. Developed delirium post-op. Transferred to [facility] 5/14 for respiratory failure requiring intubation. Developed abdominal fascial dehiscence and taken back to OR 5/16. Failed extubation, and required tracheostomy 5/22. Weaned to [facility] 6/21. He was noted to have dysphagia and was evaluated by Speech Therapy and found to have a large Zenker diverticulum. He is status post Endoscopic Zenker diverticulectomy 7/2. Swallowing / diet was advanced post procedure. He was decanulated 8/7, PEG tube out 8/1. Presents now for eval of his COPD.   He was d/c to home on albuterol nebs q6h and pulmicort nebs q12h. Tells me that he is having some exertional SOB. He does not have any O2 at this time.   ROV 87/68/11 -- Complicated hospitalization as above, presents back today for COPD. PFT today with moderately severe AFL, restriction on volumes, decreased DLCO that corrects for Va. Hypoxemia identified last week - started O2. On albuterol prn + pulmicort. He does still have DOE. Wearing his O2 reliably.   ROV 10/12/12 -- COPD with moderately severe AFL. He is only using his O2 prn. He is on Spiriva, started 10/13. Also doing pulmicort + albuterol. He can't tell much difference, but he is less congested, having less cough. He uses albuterol about 2x a day.  He is active, walks every morning.   ROV 07/13/13 -- COPD, hypoxemia. On spiriva + pulmicort/albuterol has been using prn. Returns today for f/u. He is wearing O2 more reliably, mainly because he has been having more trouble for last 3-4 weeks. He believes he has been worse since his hernia surgery. He is coughing, white  mucous. He is dyspneic especially at night when supine.   ROV 03/06/14 -- returns for COPD, hypoxemia. Last time we treated for an acute flare, added Breo to spirva stopped pulmicort. He ran out of [place] after the samples ran out, Spiriva sometime before March.  Reports more dyspnea. He was just prescribed duonebs by his PCP to take qid. He hasn't smoked since 3 yrs ago.   ROV 03/29/14 -- follow up for COPD, hypoxemia. Last time we started Anoro and feels he has benefited. SABA use is low. Occas wheeze and cough > better. He likes the new medication  ROV 09/05/14 -- follow up visit for COPD and associated hypoxemia. He tells me that he sometimes has nocturnal dyspnea. He is staying active, but does have trouble with hills. He has a binder on for an abdominal hernia. He was temporarily on Anoro, now back to Spiriva for cost reasons. Uses SABA prn, about every other day. No flares since July. Only using O2 prn.      Objective:   Physical Exam Filed Vitals:   09/05/14 1605  BP: 134/76  Pulse: 99  Height: 6\' 3"  (1.905 m)  Weight: 214 lb (97.07 kg)  SpO2: 97%   Gen: Ple[place], thin, in no distress,  normal affect  ENT: No lesions,  mouth clear,  oropharynx clear, no postnasal drip  Neck: No JVD, no TMG, no carotid bruits, trach site closing. Stoma has closed, looks good  Lungs: No use of accessory muscles, B diffuse exp wheezing  Cardiovascular: RRR, heart sounds normal,  no murmur or gallops, no peripheral edema  Musculoskeletal: No deformities, no cyanosis or clubbing  Neuro: alert, non focal  Skin: Warm, no lesions or rashes     Assessment & Plan:  COPD (chronic obstructive pulmonary disease) Please continue your Spiriva as you have been taking it Use albuterol as needed Flu shot today  You need to wear her oxygen as needed in order to keep your saturations greater than 90%. If you're doing enough to drop below 90% and you should have had your oxygen on Follow with Dr Lamonte Sakai in 6  months or sooner if you have any problems

## 2014-09-05 NOTE — Assessment & Plan Note (Signed)
Please continue your Spiriva as you have been taking it Use albuterol as needed Flu shot today  You need to wear her oxygen as needed in order to keep your saturations greater than 90%. If you're doing enough to drop below 90% and you should have had your oxygen on Follow with Dr Lamonte Sakai in 6 months or sooner if you have any problems

## 2014-09-06 ENCOUNTER — Ambulatory Visit (INDEPENDENT_AMBULATORY_CARE_PROVIDER_SITE_OTHER): Payer: Medicare Other | Admitting: *Deleted

## 2014-09-06 DIAGNOSIS — Z23 Encounter for immunization: Secondary | ICD-10-CM

## 2014-10-23 DIAGNOSIS — J449 Chronic obstructive pulmonary disease, unspecified: Secondary | ICD-10-CM | POA: Diagnosis not present

## 2014-11-23 DIAGNOSIS — J449 Chronic obstructive pulmonary disease, unspecified: Secondary | ICD-10-CM | POA: Diagnosis not present

## 2014-12-22 DIAGNOSIS — J449 Chronic obstructive pulmonary disease, unspecified: Secondary | ICD-10-CM | POA: Diagnosis not present

## 2014-12-31 DIAGNOSIS — K432 Incisional hernia without obstruction or gangrene: Secondary | ICD-10-CM | POA: Diagnosis not present

## 2015-01-22 DIAGNOSIS — J449 Chronic obstructive pulmonary disease, unspecified: Secondary | ICD-10-CM | POA: Diagnosis not present

## 2015-02-18 DIAGNOSIS — F329 Major depressive disorder, single episode, unspecified: Secondary | ICD-10-CM | POA: Diagnosis not present

## 2015-02-18 DIAGNOSIS — I10 Essential (primary) hypertension: Secondary | ICD-10-CM | POA: Diagnosis not present

## 2015-02-18 DIAGNOSIS — J449 Chronic obstructive pulmonary disease, unspecified: Secondary | ICD-10-CM | POA: Diagnosis not present

## 2015-02-21 DIAGNOSIS — J449 Chronic obstructive pulmonary disease, unspecified: Secondary | ICD-10-CM | POA: Diagnosis not present

## 2015-03-17 ENCOUNTER — Emergency Department (HOSPITAL_COMMUNITY): Payer: Medicare Other

## 2015-03-17 ENCOUNTER — Emergency Department (HOSPITAL_COMMUNITY)
Admission: EM | Admit: 2015-03-17 | Discharge: 2015-03-17 | Disposition: A | Discharge status: Home / Self Care | Payer: Medicare Other | Attending: Emergency Medicine | Admitting: Emergency Medicine

## 2015-03-17 ENCOUNTER — Encounter (HOSPITAL_COMMUNITY): Payer: Self-pay | Admitting: Emergency Medicine

## 2015-03-17 DIAGNOSIS — Z87891 Personal history of nicotine dependence: Secondary | ICD-10-CM | POA: Diagnosis not present

## 2015-03-17 DIAGNOSIS — Z7982 Long term (current) use of aspirin: Secondary | ICD-10-CM | POA: Diagnosis not present

## 2015-03-17 DIAGNOSIS — Z79899 Other long term (current) drug therapy: Secondary | ICD-10-CM | POA: Insufficient documentation

## 2015-03-17 DIAGNOSIS — Z8719 Personal history of other diseases of the digestive system: Secondary | ICD-10-CM | POA: Diagnosis not present

## 2015-03-17 DIAGNOSIS — R0602 Shortness of breath: Secondary | ICD-10-CM | POA: Diagnosis not present

## 2015-03-17 DIAGNOSIS — Z8701 Personal history of pneumonia (recurrent): Secondary | ICD-10-CM | POA: Diagnosis not present

## 2015-03-17 DIAGNOSIS — R05 Cough: Secondary | ICD-10-CM | POA: Diagnosis not present

## 2015-03-17 DIAGNOSIS — Z9981 Dependence on supplemental oxygen: Secondary | ICD-10-CM | POA: Diagnosis not present

## 2015-03-17 DIAGNOSIS — Z8659 Personal history of other mental and behavioral disorders: Secondary | ICD-10-CM | POA: Insufficient documentation

## 2015-03-17 DIAGNOSIS — J441 Chronic obstructive pulmonary disease with (acute) exacerbation: Secondary | ICD-10-CM | POA: Insufficient documentation

## 2015-03-17 DIAGNOSIS — I10 Essential (primary) hypertension: Secondary | ICD-10-CM | POA: Diagnosis not present

## 2015-03-17 DIAGNOSIS — Z8546 Personal history of malignant neoplasm of prostate: Secondary | ICD-10-CM | POA: Insufficient documentation

## 2015-03-17 DIAGNOSIS — Z7951 Long term (current) use of inhaled steroids: Secondary | ICD-10-CM | POA: Diagnosis not present

## 2015-03-17 LAB — COMPREHENSIVE METABOLIC PANEL
ALK PHOS: 52 U/L (ref 38–126)
ALT: 16 U/L — AB (ref 17–63)
AST: 16 U/L (ref 15–41)
Albumin: 3.9 g/dL (ref 3.5–5.0)
Anion gap: 7 (ref 5–15)
BILIRUBIN TOTAL: 0.7 mg/dL (ref 0.3–1.2)
BUN: 16 mg/dL (ref 6–20)
CO2: 24 mmol/L (ref 22–32)
Calcium: 9 mg/dL (ref 8.9–10.3)
Chloride: 108 mmol/L (ref 101–111)
Creatinine, Ser: 0.98 mg/dL (ref 0.61–1.24)
GFR calc non Af Amer: >60 mL/min (ref >60)
Glucose, Bld: 82 mg/dL (ref 65–99)
Potassium: 4 mmol/L (ref 3.5–5.1)
Sodium: 139 mmol/L (ref 135–145)
Total Protein: 7.2 g/dL (ref 6.5–8.1)

## 2015-03-17 LAB — CBC WITH DIFFERENTIAL/PLATELET
BASOS ABS: 0.1 10*3/uL (ref 0.0–0.1)
BASOS PCT: 1 % (ref 0–1)
Eosinophils Absolute: 0.6 10*3/uL (ref 0.0–0.7)
Eosinophils Relative: 6 % — ABNORMAL HIGH (ref 0–5)
HEMATOCRIT: 46.9 % (ref 39.0–52.0)
Hemoglobin: 15.5 g/dL (ref 13.0–17.0)
Lymphocytes Relative: 24 % (ref 12–46)
Lymphs Abs: 2.4 10*3/uL (ref 0.7–4.0)
MCH: 28 pg (ref 26.0–34.0)
MCHC: 33 g/dL (ref 30.0–36.0)
MCV: 84.8 fL (ref 78.0–100.0)
MONOS PCT: 6 % (ref 3–12)
Monocytes Absolute: 0.6 10*3/uL (ref 0.1–1.0)
NEUTROS PCT: 63 % (ref 43–77)
Neutro Abs: 6.4 10*3/uL (ref 1.7–7.7)
Platelets: 235 10*3/uL (ref 150–400)
RBC: 5.53 MIL/uL (ref 4.22–5.81)
RDW: 14.5 % (ref 11.5–15.5)
WBC: 10 10*3/uL (ref 4.0–10.5)

## 2015-03-17 LAB — BRAIN NATRIURETIC PEPTIDE: B NATRIURETIC PEPTIDE 5: 24 pg/mL (ref 0.0–100.0)

## 2015-03-17 LAB — TROPONIN I: Troponin I: <0.03 ng/mL (ref <0.031)

## 2015-03-17 MED ORDER — ALBUTEROL SULFATE (2.5 MG/3ML) 0.083% IN NEBU
2.5000 mg | INHALATION_SOLUTION | Freq: Once | RESPIRATORY_TRACT | Status: AC
  Administered 2015-03-17: 2.5 mg via RESPIRATORY_TRACT
  Filled 2015-03-17: qty 3

## 2015-03-17 MED ORDER — IPRATROPIUM-ALBUTEROL 0.5-2.5 (3) MG/3ML IN SOLN
3.0000 mL | Freq: Once | RESPIRATORY_TRACT | Status: AC
  Administered 2015-03-17: 3 mL via RESPIRATORY_TRACT
  Filled 2015-03-17: qty 3

## 2015-03-17 MED ORDER — PREDNISONE 10 MG PO TABS: 20.0000 mg | ORAL_TABLET | Freq: Every day | ORAL | Status: DC

## 2015-03-17 MED ORDER — AMOXICILLIN 250 MG PO CAPS
500.0000 mg | ORAL_CAPSULE | Freq: Once | ORAL | Status: AC
  Administered 2015-03-17: 500 mg via ORAL
  Filled 2015-03-17: qty 2

## 2015-03-17 MED ORDER — METHYLPREDNISOLONE SODIUM SUCC 125 MG IJ SOLR
125.0000 mg | Freq: Once | INTRAMUSCULAR | Status: AC
  Administered 2015-03-17: 125 mg via INTRAVENOUS
  Filled 2015-03-17: qty 2

## 2015-03-17 MED ORDER — AMOXICILLIN 500 MG PO CAPS: 500.0000 mg | ORAL_CAPSULE | Freq: Three times a day (TID) | ORAL | Status: DC

## 2015-03-17 NOTE — ED Notes (Signed)
Pt states that he has been having shortness of breath with productive cough for the past 3 days.  States that he has been using oxygen more than normal.

## 2015-03-17 NOTE — ED Provider Notes (Signed)
CSN: [id number]     Arrival date & time 03/17/15  1712 History   First MD Initiated Contact with Patient 03/17/15 1718     Chief Complaint  Patient presents with  . Shortness of Breath     (Consider location/radiation/quality/duration/timing/severity/associated sxs/prior Treatment) Patient is a 69 y.o. male presenting with shortness of breath. The history is provided by the patient (the pt complains of cough and sob).  Shortness of Breath Severity:  Moderate Onset quality:  Gradual Timing:  Intermittent Progression:  Waxing and waning Chronicity:  Recurrent Context: not activity   Associated symptoms: cough   Associated symptoms: no abdominal pain, no chest pain, no headaches and no rash     Past Medical History  Diagnosis Date  . Hypertension   . Cancer   . Cancer of prostate   . Depression   . Asthma   . Shortness of breath   . COPD (chronic obstructive pulmonary disease)     home O2  . Aspiration pneumonia 03/01/2012  . Hernia of abdominal cavity    Past Surgical History  Procedure Laterality Date  . Hernia repair    . Prostate biopsy    . Robotic prostate surgery  2011  . Laparotomy  02/25/2012    Procedure: EXPLORATORY LAPAROTOMY;  Surgeon: Donato Heinz, MD;  Location: [facility];  Service: General;  Laterality: N/A;  . Bowel resection  02/25/2012    Procedure: SMALL BOWEL RESECTION;  Surgeon: Donato Heinz, MD;  Location: [facility];  Service: General;;  . Wound debridement  03/02/2012    Procedure: DEBRIDEMENT CLOSURE/ABDOMINAL WOUND;  Surgeon: Gwenyth Ober, MD;  Location: [place];  Service: General;  Laterality: N/A;  . Tracheostomy  03/2012  . Ventral hernia repair  12/25/2012     Dr Hulen Skains  . Ventral hernia repair N/A 12/25/2012    Procedure: HERNIA REPAIR VENTRAL ADULT;  Surgeon: Gwenyth Ober, MD;  Location: [place];  Service: General;  Laterality: N/A;  . Insertion of mesh N/A 12/25/2012    Procedure: INSERTION OF MESH;  Surgeon: Gwenyth Ober, MD;  Location: [place];   Service: General;  Laterality: N/A;  . Incision and drainage abscess N/A 01/15/2013    Procedure: INCISION AND DRAINAGE Abdominal Wall ABSCESS;  Surgeon: Gwenyth Ober, MD;  Location: [place];  Service: General;  Laterality: N/A;   Family History  Problem Relation Age of Onset  . Pseudochol deficiency Neg Hx   . Anesthesia problems Neg Hx   . Hypotension Neg Hx   . Malignant hyperthermia Neg Hx    History  Substance Use Topics  . Smoking status: Former Smoker -- 1.50 packs/day for 44 years    Types: Cigarettes    Quit date: 02/22/2012  . Smokeless tobacco: Never Used  . Alcohol Use: No     Comment: occasional beer 1-2    Review of Systems  Constitutional: Negative for appetite change and fatigue.  HENT: Negative for congestion, ear discharge and sinus pressure.   Eyes: Negative for discharge.  Respiratory: Positive for cough and shortness of breath.   Cardiovascular: Negative for chest pain.  Gastrointestinal: Negative for abdominal pain and diarrhea.  Genitourinary: Negative for frequency and hematuria.  Musculoskeletal: Negative for back pain.  Skin: Negative for rash.  Neurological: Negative for seizures and headaches.  Psychiatric/Behavioral: Negative for hallucinations.      Allergies  Lorazepam  Home Medications   Prior to Admission medications   Medication Sig Start Date End Date Taking?  Authorizing Provider  aspirin EC 81 MG tablet Take 81 mg by mouth once a week.   Yes Historical Provider, MD  budesonide-formoterol (SYMBICORT) 160-4.5 MCG/ACT inhaler Inhale 2 puffs into the lungs 2 (two) times daily. 04/20/14  Yes Thurnell Lose, MD  ipratropium-albuterol (DUONEB) 0.5-2.5 (3) MG/3ML SOLN Take 3 mLs by nebulization every 6 (six) hours as needed.   Yes Historical Provider, MD  lisinopril (PRINIVIL,ZESTRIL) 20 MG tablet Take 20 mg by mouth daily.   Yes Historical Provider, MD  traMADol-acetaminophen (ULTRACET) 37.5-325 MG per tablet Take 1 tablet by mouth 2 (two)  times daily as needed for moderate pain or severe pain.    Yes Historical Provider, MD  UNKNOWN TO PATIENT Take 1 tablet by mouth daily.   Yes Historical Provider, MD  albuterol (PROVENTIL) (5 MG/ML) 0.5% nebulizer solution Take 2.5 mg by nebulization every 6 (six) hours. AND Q3 PRN SOB or Wheezing 04/25/12   Donita Brooks, NP  amoxicillin (AMOXIL) 500 MG capsule Take 1 capsule (500 mg total) by mouth 3 (three) times daily. 03/17/15   Milton Ferguson, MD  Ipratropium-Albuterol (COMBIVENT RESPIMAT) 20-100 MCG/ACT AERS respimat Inhale 1 puff into the lungs every 6 (six) hours. Patient not taking: Reported on 03/17/2015 04/08/14   Collene Gobble, MD  predniSONE (DELTASONE) 10 MG tablet Take 2 tablets (20 mg total) by mouth daily. 03/17/15   Milton Ferguson, MD   BP 143/104 mmHg  Pulse 87  Temp(Src) 98.1 F (36.7 C) (Oral)  Resp 21  Ht 6' (1.829 m)  Wt 215 lb (97.523 kg)  BMI 29.15 kg/m2  SpO2 95% Physical Exam  Constitutional: He is oriented to person, place, and time. He appears well-developed.  HENT:  Head: Normocephalic.  Eyes: Conjunctivae and EOM are normal. No scleral icterus.  Neck: Neck supple. No thyromegaly present.  Cardiovascular: Normal rate and regular rhythm.  Exam reveals no gallop and no friction rub.   No murmur heard. Pulmonary/Chest: No stridor. He has wheezes. He has no rales. He exhibits no tenderness.  Abdominal: He exhibits no distension. There is no tenderness. There is no rebound.  Musculoskeletal: Normal range of motion. He exhibits no edema.  Lymphadenopathy:    He has no cervical adenopathy.  Neurological: He is oriented to person, place, and time. He exhibits normal muscle tone. Coordination normal.  Skin: No rash noted. No erythema.  Psychiatric: He has a normal mood and affect. His behavior is normal.    ED Course  Procedures (including critical care time) Labs Review Labs Reviewed  CBC WITH DIFFERENTIAL/PLATELET - Abnormal; Notable for the following:     Eosinophils Relative 6 (*)    All other components within normal limits  COMPREHENSIVE METABOLIC PANEL - Abnormal; Notable for the following:    ALT 16 (*)    All other components within normal limits  BRAIN NATRIURETIC PEPTIDE  TROPONIN I    Imaging Review Dg Chest 2 View  03/17/2015   CLINICAL DATA:  Acute onset of shortness of breath and productive cough. Initial encounter.  EXAM: CHEST  2 VIEW  COMPARISON:  Chest radiograph performed 04/19/2014  FINDINGS: The lungs are well-aerated. Vascular congestion is noted. Increased interstitial markings raise concern for mild interstitial edema. Chronic right-sided pleural thickening is noted. No pleural effusion or pneumothorax is seen.  The heart is borderline normal in size. No acute osseous abnormalities are seen.  IMPRESSION: Vascular congestion noted. Increased interstitial markings raise concern for mild interstitial edema. Chronic right-sided pleural thickening noted.  Electronically Signed   By: Garald Balding M.D.   On: 03/17/2015 18:38     EKG Interpretation   Date/Time:  Monday Mar 17 2015 17:27:26 EDT Ventricular Rate:  84 PR Interval:  188 QRS Duration: 98 QT Interval:  357 QTC Calculation: 422 R Axis:   50 Text Interpretation:  Sinus rhythm Ventricular bigeminy Abnormal inferior  Q waves ST elevation, consider inferior injury Baseline wander in lead(s)  V2 Confirmed by Lindwood Mogel  MD, Takita Riecke [phone]) on 03/17/2015 7:05:00 PM      MDM   Final diagnoses:  COPD exacerbation    Copd exacerbation,  tx with prednisone and amox and follow up pcp this week.    Milton Ferguson, MD 03/17/15 2036

## 2015-03-17 NOTE — Discharge Instructions (Signed)
Follow up with your md in 2-3 days for recheck °

## 2015-03-20 ENCOUNTER — Ambulatory Visit: Payer: Medicare Other | Admitting: Emergency Medicine

## 2015-03-24 DIAGNOSIS — J449 Chronic obstructive pulmonary disease, unspecified: Secondary | ICD-10-CM | POA: Diagnosis not present

## 2015-03-26 ENCOUNTER — Emergency Department (HOSPITAL_COMMUNITY): Payer: Medicare Other

## 2015-03-26 ENCOUNTER — Inpatient Hospital Stay (HOSPITAL_COMMUNITY)
Admission: EM | Admit: 2015-03-26 | Discharge: 2015-03-31 | DRG: 191 | Disposition: A | Discharge status: Home / Self Care | Payer: Medicare Other | Attending: Internal Medicine | Admitting: Internal Medicine

## 2015-03-26 ENCOUNTER — Encounter (HOSPITAL_COMMUNITY): Payer: Self-pay

## 2015-03-26 DIAGNOSIS — J9611 Chronic respiratory failure with hypoxia: Secondary | ICD-10-CM | POA: Diagnosis not present

## 2015-03-26 DIAGNOSIS — Z7952 Long term (current) use of systemic steroids: Secondary | ICD-10-CM | POA: Diagnosis not present

## 2015-03-26 DIAGNOSIS — I10 Essential (primary) hypertension: Secondary | ICD-10-CM | POA: Diagnosis present

## 2015-03-26 DIAGNOSIS — Z87891 Personal history of nicotine dependence: Secondary | ICD-10-CM

## 2015-03-26 DIAGNOSIS — I129 Hypertensive chronic kidney disease with stage 1 through stage 4 chronic kidney disease, or unspecified chronic kidney disease: Secondary | ICD-10-CM | POA: Diagnosis not present

## 2015-03-26 DIAGNOSIS — R05 Cough: Secondary | ICD-10-CM | POA: Diagnosis not present

## 2015-03-26 DIAGNOSIS — F32A Depression, unspecified: Secondary | ICD-10-CM | POA: Diagnosis present

## 2015-03-26 DIAGNOSIS — Z9981 Dependence on supplemental oxygen: Secondary | ICD-10-CM | POA: Diagnosis not present

## 2015-03-26 DIAGNOSIS — Z8546 Personal history of malignant neoplasm of prostate: Secondary | ICD-10-CM

## 2015-03-26 DIAGNOSIS — R0602 Shortness of breath: Secondary | ICD-10-CM | POA: Diagnosis present

## 2015-03-26 DIAGNOSIS — K439 Ventral hernia without obstruction or gangrene: Secondary | ICD-10-CM | POA: Diagnosis present

## 2015-03-26 DIAGNOSIS — F329 Major depressive disorder, single episode, unspecified: Secondary | ICD-10-CM | POA: Diagnosis not present

## 2015-03-26 DIAGNOSIS — R0789 Other chest pain: Secondary | ICD-10-CM | POA: Diagnosis not present

## 2015-03-26 DIAGNOSIS — Z7982 Long term (current) use of aspirin: Secondary | ICD-10-CM | POA: Diagnosis not present

## 2015-03-26 DIAGNOSIS — R0682 Tachypnea, not elsewhere classified: Secondary | ICD-10-CM | POA: Diagnosis not present

## 2015-03-26 DIAGNOSIS — N189 Chronic kidney disease, unspecified: Secondary | ICD-10-CM | POA: Diagnosis not present

## 2015-03-26 DIAGNOSIS — J441 Chronic obstructive pulmonary disease with (acute) exacerbation: Secondary | ICD-10-CM | POA: Diagnosis not present

## 2015-03-26 DIAGNOSIS — J45909 Unspecified asthma, uncomplicated: Secondary | ICD-10-CM | POA: Diagnosis present

## 2015-03-26 LAB — TROPONIN I: Troponin I: <0.03 ng/mL (ref <0.031)

## 2015-03-26 LAB — BLOOD GAS, ARTERIAL
Acid-Base Excess: 2.3 mmol/L — ABNORMAL HIGH (ref 0.0–2.0)
Bicarbonate: 26.2 mEq/L — ABNORMAL HIGH (ref 20.0–24.0)
DRAWN BY: 317771
O2 Content: 2 L/min
O2 Saturation: 96.1 %
PCO2 ART: 39.7 mmHg (ref 35.0–45.0)
Patient temperature: 37
TCO2: 22.1 mmol/L (ref 0–100)
pH, Arterial: 7.435 (ref 7.350–7.450)
pO2, Arterial: 79.6 mmHg — ABNORMAL LOW (ref 80.0–100.0)

## 2015-03-26 LAB — CBC WITH DIFFERENTIAL/PLATELET
Basophils Absolute: 0.1 10*3/uL (ref 0.0–0.1)
Basophils Relative: 0 % (ref 0–1)
Eosinophils Absolute: 0.6 10*3/uL (ref 0.0–0.7)
Eosinophils Relative: 4 % (ref 0–5)
HCT: 47.9 % (ref 39.0–52.0)
Hemoglobin: 16 g/dL (ref 13.0–17.0)
LYMPHS PCT: 12 % (ref 12–46)
Lymphs Abs: 1.8 10*3/uL (ref 0.7–4.0)
MCH: 28.4 pg (ref 26.0–34.0)
MCHC: 33.4 g/dL (ref 30.0–36.0)
MCV: 85.1 fL (ref 78.0–100.0)
Monocytes Absolute: 1.1 10*3/uL — ABNORMAL HIGH (ref 0.1–1.0)
Monocytes Relative: 7 % (ref 3–12)
NEUTROS PCT: 77 % (ref 43–77)
Neutro Abs: 11 10*3/uL — ABNORMAL HIGH (ref 1.7–7.7)
Platelets: 225 10*3/uL (ref 150–400)
RBC: 5.63 MIL/uL (ref 4.22–5.81)
RDW: 14.7 % (ref 11.5–15.5)
WBC: 14.5 10*3/uL — ABNORMAL HIGH (ref 4.0–10.5)

## 2015-03-26 LAB — BASIC METABOLIC PANEL
Anion gap: 8 (ref 5–15)
BUN: 12 mg/dL (ref 6–20)
CO2: 24 mmol/L (ref 22–32)
Calcium: 9.1 mg/dL (ref 8.9–10.3)
Chloride: 104 mmol/L (ref 101–111)
Creatinine, Ser: 0.84 mg/dL (ref 0.61–1.24)
Glucose, Bld: 90 mg/dL (ref 65–99)
Potassium: 4.2 mmol/L (ref 3.5–5.1)
Sodium: 136 mmol/L (ref 135–145)

## 2015-03-26 MED ORDER — ALBUTEROL (5 MG/ML) CONTINUOUS INHALATION SOLN
15.0000 mg/h | INHALATION_SOLUTION | RESPIRATORY_TRACT | Status: DC
  Administered 2015-03-26: 15 mg/h via RESPIRATORY_TRACT
  Filled 2015-03-26: qty 20

## 2015-03-26 MED ORDER — ONDANSETRON HCL 4 MG/2ML IJ SOLN
4.0000 mg | Freq: Once | INTRAMUSCULAR | Status: AC
  Administered 2015-03-26: 4 mg via INTRAVENOUS
  Filled 2015-03-26: qty 2

## 2015-03-26 MED ORDER — IPRATROPIUM-ALBUTEROL 0.5-2.5 (3) MG/3ML IN SOLN: 3.0000 mL | RESPIRATORY_TRACT | Status: DC

## 2015-03-26 MED ORDER — DEXTROSE 5 % IV SOLN
500.0000 mg | Freq: Once | INTRAVENOUS | Status: AC
  Administered 2015-03-26: 500 mg via INTRAVENOUS
  Filled 2015-03-26: qty 500

## 2015-03-26 MED ORDER — METHYLPREDNISOLONE SODIUM SUCC 125 MG IJ SOLR
125.0000 mg | Freq: Once | INTRAMUSCULAR | Status: AC
  Administered 2015-03-26: 125 mg via INTRAVENOUS
  Filled 2015-03-26: qty 2

## 2015-03-26 MED ORDER — ACETAMINOPHEN 500 MG PO TABS
1000.0000 mg | ORAL_TABLET | Freq: Once | ORAL | Status: AC
  Administered 2015-03-26: 1000 mg via ORAL
  Filled 2015-03-26: qty 2

## 2015-03-26 MED ORDER — IPRATROPIUM-ALBUTEROL 0.5-2.5 (3) MG/3ML IN SOLN
3.0000 mL | RESPIRATORY_TRACT | Status: AC
  Administered 2015-03-26: 3 mL via RESPIRATORY_TRACT
  Filled 2015-03-26: qty 3

## 2015-03-26 NOTE — ED Notes (Signed)
Increased shortness of breath. Chronic COPD, patient states he ran out of his albuterol nebulizer medication today. A&OX4 at this time, unable to speak in full sentences d/t SOB

## 2015-03-26 NOTE — ED Provider Notes (Signed)
TIME SEEN: 8:04 PM  CHIEF COMPLAINT: Shortness of Breath   HPI: HPI Comments: Michael Ellison is a 69 y.o. male with PMHX  COPD who presents to the Emergency Department complaining of SOB onset today that recently worsened about 1 hour prior. Pt has associated productive cough of white sputum and chest tightness. Pt states that he is 2.5 L of oxygen at home. Pt states that he is out of his at home nebulizer treatments. Pt states that he recently finished a week supply of steroids yesterday. Denies HX of thrombus or MI. Pt denies any tobacco use in 3 years.    ROS: See HPI Constitutional: no fever  Eyes: no drainage  ENT: no runny nose   Cardiovascular:  no chest pain  Resp: SOB, Chest tightness, and Cough  GI: no vomiting GU: no dysuria Integumentary: no rash  Allergy: no hives  Musculoskeletal: no leg swelling  Neurological: no slurred speech ROS otherwise negative  PAST MEDICAL HISTORY/PAST SURGICAL HISTORY:  Past Medical History  Diagnosis Date  . Hypertension   . Cancer   . Cancer of prostate   . Depression   . Asthma   . Shortness of breath   . COPD (chronic obstructive pulmonary disease)     home O2  . Aspiration pneumonia 03/01/2012  . Hernia of abdominal cavity     MEDICATIONS:  Prior to Admission medications   Medication Sig Start Date End Date Taking? Authorizing Provider  albuterol (PROVENTIL) (5 MG/ML) 0.5% nebulizer solution Take 2.5 mg by nebulization every 6 (six) hours. AND Q3 PRN SOB or Wheezing 04/25/12   Donita Brooks, NP  amoxicillin (AMOXIL) 500 MG capsule Take 1 capsule (500 mg total) by mouth 3 (three) times daily. 03/17/15   Milton Ferguson, MD  aspirin EC 81 MG tablet Take 81 mg by mouth once a week.    Historical Provider, MD  budesonide-formoterol (SYMBICORT) 160-4.5 MCG/ACT inhaler Inhale 2 puffs into the lungs 2 (two) times daily. 04/20/14   Thurnell Lose, MD  Ipratropium-Albuterol (COMBIVENT RESPIMAT) 20-100 MCG/ACT AERS respimat Inhale 1 puff  into the lungs every 6 (six) hours. Patient not taking: Reported on 03/17/2015 04/08/14   Collene Gobble, MD  ipratropium-albuterol (DUONEB) 0.5-2.5 (3) MG/3ML SOLN Take 3 mLs by nebulization every 6 (six) hours as needed.    Historical Provider, MD  lisinopril (PRINIVIL,ZESTRIL) 20 MG tablet Take 20 mg by mouth daily.    Historical Provider, MD  predniSONE (DELTASONE) 10 MG tablet Take 2 tablets (20 mg total) by mouth daily. 03/17/15   Milton Ferguson, MD  traMADol-acetaminophen (ULTRACET) 37.5-325 MG per tablet Take 1 tablet by mouth 2 (two) times daily as needed for moderate pain or severe pain.     Historical Provider, MD  UNKNOWN TO PATIENT Take 1 tablet by mouth daily.    Historical Provider, MD    ALLERGIES:  Allergies  Allergen Reactions  . Lorazepam Other (See Comments)    Severe agitated delirium. Tolerates midazolam and other benzo's    SOCIAL HISTORY:  History  Substance Use Topics  . Smoking status: Former Smoker -- 1.50 packs/day for 44 years    Types: Cigarettes    Quit date: 02/22/2012  . Smokeless tobacco: Never Used  . Alcohol Use: No     Comment: occasional beer 1-2    FAMILY HISTORY: Family History  Problem Relation Age of Onset  . Pseudochol deficiency Neg Hx   . Anesthesia problems Neg Hx   . Hypotension Neg Hx   .  Malignant hyperthermia Neg Hx     EXAM: BP 146/77 mmHg  Pulse 114  Temp(Src) 98.3 F (36.8 C) (Oral)  Resp 24  Ht 6\' 3"  (1.905 m)  Wt 218 lb (98.884 kg)  BMI 27.25 kg/m2  SpO2 98%  CONSTITUTIONAL: Alert and oriented and responds appropriately to questions. In mild to moderate respiratory distress HEAD: Normocephalic EYES: Conjunctivae clear, PERRL ENT: normal nose; no rhinorrhea; moist mucous membranes; pharynx without lesions noted NECK: Supple, no meningismus, no LAD  CARD: Regular rhythm and tachycardiac; S1 and S2 appreciated; no murmurs, no clicks, no rubs, no gallops RESP: Mild to moderate respiratory distress, speaking short  sentences but no hypoxia. Diffuse expiratory wheezing, diminished at bases, diffuse rhonchorous breath sounds and no rales ABD/GI: Normal bowel sounds; non-distended; soft, non-tender, no rebound, no guarding, no peritoneal signs BACK:  The back appears normal and is non-tender to palpation, there is no CVA tenderness EXT: Normal ROM in all joints; non-tender to palpation; no edema; normal capillary refill; no cyanosis, no calf tenderness or swelling    SKIN: Normal color for age and race; warm NEURO: Moves all extremities equally, sensation to light touch intact diffusely, cranial nerves II through XII intact PSYCH: The patient's mood and manner are appropriate. Grooming and personal hygiene are appropriate.  MEDICAL DECISION MAKING: Patient here with mild to moderate respiratory distress, COPD exacerbation. We'll obtain labs, ABG, chest x-ray. We'll give continuous albuterol, Solu-Medrol.  ED PROGRESS: Labs unremarkable other than leukocytosis which may be from recent steroid use. Chest x-ray shows no infiltrate or other acute abnormality. Troponin negative. ABG is also reassuring. Patient given continuous albuterol 15 mg over one hour and 2 duo nebs. He is now able to speak full sentences but still having wheezing, rhonchorous breath sounds. Discussed with Dr. Shanon Brow with hospitalist service who agrees to admit to medical bed, observation. Patient comfortable with this plan.    EKG Interpretation  Date/Time:  Wednesday March 26 2015 20:05:18 EDT Ventricular Rate:  109 PR Interval:  168 QRS Duration: 91 QT Interval:  308 QTC Calculation: 415 R Axis:   79 Text Interpretation:  Sinus tachycardia Artifact in lead(s) I II III aVR aVL aVF V1 V2 V4 V5 V6 Confirmed by Sabastian Raimondi,  DO, Halana Deisher ([id number]) on 03/26/2015 8:13:07 PM       CRITICAL CARE Performed by: Nyra Jabs   Total critical care time: 35 minutes  Critical care time was exclusive of separately billable procedures and treating other  patients.  Critical care was necessary to treat or prevent imminent or life-threatening deterioration.  Critical care was time spent personally by me on the following activities: development of treatment plan with patient and/or surrogate as well as nursing, discussions with consultants, evaluation of patient's response to treatment, examination of patient, obtaining history from patient or surrogate, ordering and performing treatments and interventions, ordering and review of laboratory studies, ordering and review of radiographic studies, pulse oximetry and re-evaluation of patient's condition.    I personally performed the services described in this documentation, which was scribed in my presence. The recorded information has been reviewed and is accurate.   [place], DO 03/26/15 2309

## 2015-03-26 NOTE — ED Notes (Signed)
Patient c/o nausea and shortness of breath.

## 2015-03-26 NOTE — ED Notes (Signed)
Pt with sob, cough, congestion for a couple of days, reports worse today.  Pt states he has run out of his nebulizer meds   Pt only reports chest pressure with deep breathing

## 2015-03-27 DIAGNOSIS — J9601 Acute respiratory failure with hypoxia: Secondary | ICD-10-CM | POA: Diagnosis not present

## 2015-03-27 DIAGNOSIS — Z7952 Long term (current) use of systemic steroids: Secondary | ICD-10-CM | POA: Diagnosis not present

## 2015-03-27 DIAGNOSIS — R0602 Shortness of breath: Secondary | ICD-10-CM

## 2015-03-27 DIAGNOSIS — F329 Major depressive disorder, single episode, unspecified: Secondary | ICD-10-CM | POA: Diagnosis present

## 2015-03-27 DIAGNOSIS — K439 Ventral hernia without obstruction or gangrene: Secondary | ICD-10-CM | POA: Diagnosis present

## 2015-03-27 DIAGNOSIS — J9611 Chronic respiratory failure with hypoxia: Secondary | ICD-10-CM | POA: Diagnosis not present

## 2015-03-27 DIAGNOSIS — N189 Chronic kidney disease, unspecified: Secondary | ICD-10-CM | POA: Diagnosis present

## 2015-03-27 DIAGNOSIS — I10 Essential (primary) hypertension: Secondary | ICD-10-CM

## 2015-03-27 DIAGNOSIS — Z87891 Personal history of nicotine dependence: Secondary | ICD-10-CM | POA: Diagnosis not present

## 2015-03-27 DIAGNOSIS — J441 Chronic obstructive pulmonary disease with (acute) exacerbation: Principal | ICD-10-CM

## 2015-03-27 DIAGNOSIS — J45909 Unspecified asthma, uncomplicated: Secondary | ICD-10-CM | POA: Diagnosis present

## 2015-03-27 DIAGNOSIS — Z7982 Long term (current) use of aspirin: Secondary | ICD-10-CM | POA: Diagnosis not present

## 2015-03-27 DIAGNOSIS — Z9981 Dependence on supplemental oxygen: Secondary | ICD-10-CM | POA: Diagnosis not present

## 2015-03-27 DIAGNOSIS — Z8546 Personal history of malignant neoplasm of prostate: Secondary | ICD-10-CM | POA: Diagnosis not present

## 2015-03-27 DIAGNOSIS — I129 Hypertensive chronic kidney disease with stage 1 through stage 4 chronic kidney disease, or unspecified chronic kidney disease: Secondary | ICD-10-CM | POA: Diagnosis present

## 2015-03-27 LAB — BASIC METABOLIC PANEL
ANION GAP: 10 (ref 5–15)
BUN: 15 mg/dL (ref 6–20)
CHLORIDE: 104 mmol/L (ref 101–111)
CO2: 24 mmol/L (ref 22–32)
CREATININE: 1.01 mg/dL (ref 0.61–1.24)
Calcium: 8.8 mg/dL — ABNORMAL LOW (ref 8.9–10.3)
GFR calc Af Amer: >60 mL/min (ref >60)
Glucose, Bld: 161 mg/dL — ABNORMAL HIGH (ref 65–99)
Potassium: 4.7 mmol/L (ref 3.5–5.1)
Sodium: 138 mmol/L (ref 135–145)

## 2015-03-27 LAB — CBC
HEMATOCRIT: 47.6 % (ref 39.0–52.0)
HEMOGLOBIN: 15.8 g/dL (ref 13.0–17.0)
MCH: 28.5 pg (ref 26.0–34.0)
MCHC: 33.2 g/dL (ref 30.0–36.0)
MCV: 85.8 fL (ref 78.0–100.0)
PLATELETS: 239 10*3/uL (ref 150–400)
RBC: 5.55 MIL/uL (ref 4.22–5.81)
RDW: 14.7 % (ref 11.5–15.5)
WBC: 10.2 10*3/uL (ref 4.0–10.5)

## 2015-03-27 LAB — MRSA PCR SCREENING

## 2015-03-27 LAB — GLUCOSE, CAPILLARY
GLUCOSE-CAPILLARY: 137 mg/dL — AB (ref 65–99)
Glucose-Capillary: 150 mg/dL — ABNORMAL HIGH (ref 65–99)
Glucose-Capillary: 115 mg/dL — ABNORMAL HIGH (ref 65–99)
Glucose-Capillary: 127 mg/dL — ABNORMAL HIGH (ref 65–99)

## 2015-03-27 MED ORDER — ALBUTEROL SULFATE (2.5 MG/3ML) 0.083% IN NEBU: 2.5000 mg | INHALATION_SOLUTION | Freq: Four times a day (QID) | RESPIRATORY_TRACT | Status: DC

## 2015-03-27 MED ORDER — IPRATROPIUM-ALBUTEROL 0.5-2.5 (3) MG/3ML IN SOLN
3.0000 mL | Freq: Four times a day (QID) | RESPIRATORY_TRACT | Status: DC
  Administered 2015-03-27 – 2015-03-31 (×18): 3 mL via RESPIRATORY_TRACT
  Filled 2015-03-27 (×18): qty 3

## 2015-03-27 MED ORDER — ASPIRIN EC 81 MG PO TBEC
81.0000 mg | DELAYED_RELEASE_TABLET | ORAL | Status: DC
  Administered 2015-03-27: 81 mg via ORAL
  Filled 2015-03-27 (×2): qty 1

## 2015-03-27 MED ORDER — ONDANSETRON HCL 4 MG PO TABS: 4.0000 mg | ORAL_TABLET | Freq: Four times a day (QID) | ORAL | Status: DC | PRN

## 2015-03-27 MED ORDER — LISINOPRIL 10 MG PO TABS
20.0000 mg | ORAL_TABLET | Freq: Every day | ORAL | Status: DC
  Administered 2015-03-27 – 2015-03-31 (×5): 20 mg via ORAL
  Filled 2015-03-27 (×5): qty 2

## 2015-03-27 MED ORDER — IPRATROPIUM BROMIDE 0.02 % IN SOLN: 0.5000 mg | Freq: Four times a day (QID) | RESPIRATORY_TRACT | Status: DC

## 2015-03-27 MED ORDER — AZITHROMYCIN 250 MG PO TABS
250.0000 mg | ORAL_TABLET | Freq: Every day | ORAL | Status: AC
  Administered 2015-03-28 – 2015-03-31 (×4): 250 mg via ORAL
  Filled 2015-03-27 (×5): qty 1

## 2015-03-27 MED ORDER — AZITHROMYCIN 250 MG PO TABS
500.0000 mg | ORAL_TABLET | Freq: Every day | ORAL | Status: AC
  Administered 2015-03-27: 500 mg via ORAL
  Filled 2015-03-27: qty 2

## 2015-03-27 MED ORDER — ACETAMINOPHEN 325 MG PO TABS
650.0000 mg | ORAL_TABLET | ORAL | Status: DC | PRN
  Administered 2015-03-27 – 2015-03-28 (×2): 650 mg via ORAL
  Filled 2015-03-27 (×3): qty 2

## 2015-03-27 MED ORDER — ONDANSETRON HCL 4 MG/2ML IJ SOLN: 4.0000 mg | Freq: Four times a day (QID) | INTRAMUSCULAR | Status: DC | PRN

## 2015-03-27 MED ORDER — SODIUM CHLORIDE 0.9 % IJ SOLN: 3.0000 mL | INTRAMUSCULAR | Status: DC | PRN

## 2015-03-27 MED ORDER — METHYLPREDNISOLONE SODIUM SUCC 125 MG IJ SOLR
80.0000 mg | Freq: Two times a day (BID) | INTRAMUSCULAR | Status: DC
  Administered 2015-03-27 – 2015-03-28 (×3): 80 mg via INTRAVENOUS
  Filled 2015-03-27 (×3): qty 2

## 2015-03-27 MED ORDER — GUAIFENESIN ER 600 MG PO TB12
1200.0000 mg | ORAL_TABLET | Freq: Two times a day (BID) | ORAL | Status: DC
  Administered 2015-03-27 – 2015-03-31 (×9): 1200 mg via ORAL
  Filled 2015-03-27 (×9): qty 2

## 2015-03-27 MED ORDER — ALBUTEROL SULFATE (2.5 MG/3ML) 0.083% IN NEBU
2.5000 mg | INHALATION_SOLUTION | RESPIRATORY_TRACT | Status: DC | PRN
  Administered 2015-03-27: 2.5 mg via RESPIRATORY_TRACT
  Filled 2015-03-27: qty 3

## 2015-03-27 MED ORDER — GUAIFENESIN-DM 100-10 MG/5ML PO SYRP: 5.0000 mL | ORAL_SOLUTION | ORAL | Status: DC | PRN

## 2015-03-27 MED ORDER — INSULIN ASPART 100 UNIT/ML ~~LOC~~ SOLN
0.0000 [IU] | Freq: Three times a day (TID) | SUBCUTANEOUS | Status: DC
  Administered 2015-03-27 – 2015-03-30 (×8): 1 [IU] via SUBCUTANEOUS

## 2015-03-27 MED ORDER — SODIUM CHLORIDE 0.9 % IV SOLN: 250.0000 mL | INTRAVENOUS | Status: DC | PRN

## 2015-03-27 MED ORDER — SODIUM CHLORIDE 0.9 % IJ SOLN
3.0000 mL | Freq: Two times a day (BID) | INTRAMUSCULAR | Status: DC
  Administered 2015-03-27 – 2015-03-30 (×9): 3 mL via INTRAVENOUS

## 2015-03-27 NOTE — H&P (Signed)
PCP:   FANTA,TESFAYE, MD   Chief Complaint:  sob  HPI: 69 yo male h/o CRF on 2 liters [place] for copd, htn, comes in with worsening sob and wheezing today.  Pt was treated for copde about a week ago with steroids, amoxicillin.  Completed his prednisone yesterday and was doing well.   But today he ran out of his albuterol neb treatments, he tried to get refill from his pcp dr Legrand Rams but was unable to do so.  His sob and wheezing progressively got worse so he came to the ED.  No fevers.  No swelling in his legs.  No cough.  Feeling better with nebs in the ED and iv solumedrol.  Review of Systems:  Positive and negative as per HPI otherwise all other systems are negative  Past Medical History: Past Medical History  Diagnosis Date  . Hypertension   . Cancer   . Cancer of prostate   . Depression   . Asthma   . Shortness of breath   . COPD (chronic obstructive pulmonary disease)     home O2  . Aspiration pneumonia 03/01/2012  . Hernia of abdominal cavity    Past Surgical History  Procedure Laterality Date  . Hernia repair    . Prostate biopsy    . Robotic prostate surgery  2011  . Laparotomy  02/25/2012    Procedure: EXPLORATORY LAPAROTOMY;  Surgeon: Donato Heinz, MD;  Location: [facility];  Service: General;  Laterality: N/A;  . Bowel resection  02/25/2012    Procedure: SMALL BOWEL RESECTION;  Surgeon: Donato Heinz, MD;  Location: [facility];  Service: General;;  . Wound debridement  03/02/2012    Procedure: DEBRIDEMENT CLOSURE/ABDOMINAL WOUND;  Surgeon: Gwenyth Ober, MD;  Location: [facility];  Service: General;  Laterality: N/A;  . Tracheostomy  03/2012  . Ventral hernia repair  12/25/2012     Dr Hulen Skains  . Ventral hernia repair N/A 12/25/2012    Procedure: HERNIA REPAIR VENTRAL ADULT;  Surgeon: Gwenyth Ober, MD;  Location: [facility];  Service: General;  Laterality: N/A;  . Insertion of mesh N/A 12/25/2012    Procedure: INSERTION OF MESH;  Surgeon: Gwenyth Ober, MD;  Location: [facility];  Service:  General;  Laterality: N/A;  . Incision and drainage abscess N/A 01/15/2013    Procedure: INCISION AND DRAINAGE Abdominal Wall ABSCESS;  Surgeon: Gwenyth Ober, MD;  Location: [facility];  Service: General;  Laterality: N/A;    Medications: Prior to Admission medications   Medication Sig Start Date End Date Taking? Authorizing Provider  aspirin EC 81 MG tablet Take 81 mg by mouth once a week.   Yes Historical Provider, MD  budesonide-formoterol (SYMBICORT) 160-4.5 MCG/ACT inhaler Inhale 2 puffs into the lungs 2 (two) times daily. 04/20/14  Yes Thurnell Lose, MD  ipratropium-albuterol (DUONEB) 0.5-2.5 (3) MG/3ML SOLN Take 3 mLs by nebulization every 6 (six) hours as needed (for shortness of breath/wheezing).    Yes Historical Provider, MD  lisinopril (PRINIVIL,ZESTRIL) 20 MG tablet Take 20 mg by mouth daily.   Yes Historical Provider, MD  traMADol-acetaminophen (ULTRACET) 37.5-325 MG per tablet Take 1 tablet by mouth 2 (two) times daily as needed for moderate pain or severe pain.    Yes Historical Provider, MD  UNKNOWN TO PATIENT Take 1 tablet by mouth daily.   Yes Historical Provider, MD  albuterol (PROVENTIL) (5 MG/ML) 0.5% nebulizer solution Take 2.5 mg by nebulization every 6 (six) hours. AND Q3 PRN  SOB or Wheezing 04/25/12   Donita Brooks, NP  amoxicillin (AMOXIL) 500 MG capsule Take 1 capsule (500 mg total) by mouth 3 (three) times daily. Patient not taking: Reported on 03/26/2015 03/17/15   Milton Ferguson, MD  Ipratropium-Albuterol (COMBIVENT RESPIMAT) 20-100 MCG/ACT AERS respimat Inhale 1 puff into the lungs every 6 (six) hours. Patient not taking: Reported on 03/17/2015 04/08/14   Collene Gobble, MD  predniSONE (DELTASONE) 10 MG tablet Take 2 tablets (20 mg total) by mouth daily. Patient not taking: Reported on 03/26/2015 03/17/15   Milton Ferguson, MD    Allergies:   Allergies  Allergen Reactions  . Lorazepam Other (See Comments)    Severe agitated delirium. Tolerates midazolam and other benzo's     Social History:  reports that he quit smoking about 3 years ago. His smoking use included Cigarettes. He has a 66 pack-year smoking history. He has never used smokeless tobacco. He reports that he does not drink alcohol or use illicit drugs.  Family History: Family History  Problem Relation Age of Onset  . Pseudochol deficiency Neg Hx   . Anesthesia problems Neg Hx   . Hypotension Neg Hx   . Malignant hyperthermia Neg Hx     Physical Exam: Filed Vitals:   03/26/15 2230 03/26/15 2300 03/26/15 2330 03/27/15 0006  BP: 125/83 131/81 124/92 156/87  Pulse: 102 102 105 99  Temp:    98.1 F (36.7 C)  TempSrc:    Oral  Resp: 18 16 15 20   Height:      Weight:    95.029 kg (209 lb 8 oz)  SpO2: 98% 93% 95% 93%   General appearance: alert, cooperative and no distress Head: Normocephalic, without obvious abnormality, atraumatic Eyes: negative Nose: Nares normal. Septum midline. Mucosa normal. No drainage or sinus tenderness. Neck: no JVD and supple, symmetrical, trachea midline Lungs: diminished breath sounds bibasilar Heart: regular rate and rhythm, S1, S2 normal, no murmur, click, rub or gallop Abdomen: soft, non-tender; bowel sounds normal; no masses,  no organomegaly Extremities: extremities normal, atraumatic, no cyanosis or edema Pulses: 2+ and symmetric Skin: Skin color, texture, turgor normal. No rashes or lesions Neurologic: Grossly normal    Labs on Admission:   Recent Labs  03/26/15 2037  NA 136  K 4.2  CL 104  CO2 24  GLUCOSE 90  BUN 12  CREATININE 0.84  CALCIUM 9.1    Recent Labs  03/26/15 2037  WBC 14.5*  NEUTROABS 11.0*  HGB 16.0  HCT 47.9  MCV 85.1  PLT 225    Recent Labs  03/26/15 2037  TROPONINI <0.03   Radiological Exams on Admission: Dg Chest 2 View  03/17/2015   CLINICAL DATA:  Acute onset of shortness of breath and productive cough. Initial encounter.  EXAM: CHEST  2 VIEW  COMPARISON:  Chest radiograph performed 04/19/2014   FINDINGS: The lungs are well-aerated. Vascular congestion is noted. Increased interstitial markings raise concern for mild interstitial edema. Chronic right-sided pleural thickening is noted. No pleural effusion or pneumothorax is seen.  The heart is borderline normal in size. No acute osseous abnormalities are seen.  IMPRESSION: Vascular congestion noted. Increased interstitial markings raise concern for mild interstitial edema. Chronic right-sided pleural thickening noted.   Electronically Signed   By: Garald Balding M.D.   On: 03/17/2015 18:38   Dg Chest Port 1 View  03/26/2015   CLINICAL DATA:  Shortness of breath with productive cough. Chest tightness for 1 week. History of COPD.  EXAM: PORTABLE CHEST - 1 VIEW  COMPARISON:  None.  FINDINGS: The heart is enlarged. Mild vascular congestion. There is slight hyperinflation. No active infiltrates or failure. Similar appearance to priors.  IMPRESSION: COPD.  Cardiomegaly.  Mild vascular congestion.   Electronically Signed   By: Rolla Flatten M.D.   On: 03/26/2015 20:44   cxr reviewed by myself no infiltate mild edema  ekg reviewed by myself sinus tach no acute changes  old records reviewed Case discussed with edp dr ward.  Assessment/Plan  69 yo male with acute copd exacerbation on chronic respiratory failure  Principal Problem:   COPD exacerbation-  Cont albuterol nebs overnight frequently.  Monitor for any respiratory distress, seems to be improving however.  Provide iv solumedrol 80mg  iv q 12 and zpack.  Will need refills for his breathing treatments at discharge.  Observe on medical.   Active Problems:   Essential hypertension- stable   Depression- stable   SOB (shortness of breath)- secondary to above   Chronic respiratory failure with hypoxia-  Cont 2 liters oxygen per [place]  obs on medical.  Full code.  pcp dr Legrand Rams.  Alida Greiner A 03/27/2015, 12:08 AM

## 2015-03-27 NOTE — Progress Notes (Signed)
Patient ambulate to bathroom c/o SOB. PRN breathing treatment given.

## 2015-03-27 NOTE — Progress Notes (Signed)
Subjective: Patient admitted with COPD. Exacerbation. He is cough, wheezing and congested.  Objective: Vital signs in last 24 hours: Temp:  [97.6 F (36.4 C)-98.3 F (36.8 C)] 97.6 F (36.4 C) (06/09 1093) Pulse Rate:  [91-114] 91 (06/09 0608) Resp:  [15-24] 20 (06/09 0608) BP: (113-156)/(70-92) 131/75 mmHg (06/09 0608) SpO2:  [93 %-100 %] 94 % (06/09 0743) Weight:  [94.484 kg (208 lb 4.8 oz)-98.884 kg (218 lb)] 94.484 kg (208 lb 4.8 oz) (06/09 0650) Weight change:  Last BM Date: 03/26/15  Intake/Output from previous day: 06/08 0701 - 06/09 0700 In: -  Out: 550 [Urine:550]  PHYSICAL EXAM General appearance: alert and mild distress Resp: diminished breath sounds bilaterally and wheezes bilaterally Cardio: regular rate and rhythm GI: soft, non-tender; bowel sounds normal; no masses,  no organomegaly Extremities: extremities normal, atraumatic, no cyanosis or edema  Lab Results:  Results for orders placed or performed during the hospital encounter of 03/26/15 (from the past 48 hour(s))  CBC with Differential     Status: Abnormal   Collection Time: 03/26/15  8:37 PM  Result Value Ref Range   WBC 14.5 (H) 4.0 - 10.5 K/uL   RBC 5.63 4.22 - 5.81 MIL/uL   Hemoglobin 16.0 13.0 - 17.0 g/dL   HCT 47.9 39.0 - 52.0 %   MCV 85.1 78.0 - 100.0 fL   MCH 28.4 26.0 - 34.0 pg   MCHC 33.4 30.0 - 36.0 g/dL   RDW 14.7 11.5 - 15.5 %   Platelets 225 150 - 400 K/uL   Neutrophils Relative % 77 43 - 77 %   Neutro Abs 11.0 (H) 1.7 - 7.7 K/uL   Lymphocytes Relative 12 12 - 46 %   Lymphs Abs 1.8 0.7 - 4.0 K/uL   Monocytes Relative 7 3 - 12 %   Monocytes Absolute 1.1 (H) 0.1 - 1.0 K/uL   Eosinophils Relative 4 0 - 5 %   Eosinophils Absolute 0.6 0.0 - 0.7 K/uL   Basophils Relative 0 0 - 1 %   Basophils Absolute 0.1 0.0 - 0.1 K/uL  Basic metabolic panel     Status: None   Collection Time: 03/26/15  8:37 PM  Result Value Ref Range   Sodium 136 135 - 145 mmol/L   Potassium 4.2 3.5 - 5.1 mmol/L    Chloride 104 101 - 111 mmol/L   CO2 24 22 - 32 mmol/L   Glucose, Bld 90 65 - 99 mg/dL   BUN 12 6 - 20 mg/dL   Creatinine, Ser 0.84 0.61 - 1.24 mg/dL   Calcium 9.1 8.9 - 10.3 mg/dL   GFR calc non Af Amer >60 >60 mL/min   GFR calc Af Amer >60 >60 mL/min    Comment: (NOTE) The eGFR has been calculated using the CKD EPI equation. This calculation has not been validated in all clinical situations. eGFR's persistently <60 mL/min signify possible Chronic Kidney Disease.    Anion gap 8 5 - 15  Troponin I     Status: None   Collection Time: 03/26/15  8:37 PM  Result Value Ref Range   Troponin I <0.03 <0.031 ng/mL    Comment:        NO INDICATION OF MYOCARDIAL INJURY.   Blood gas, arterial ([facility] ONLY)     Status: Abnormal   Collection Time: 03/26/15  8:50 PM  Result Value Ref Range   O2 Content 2.0 L/min   Delivery systems NASAL CANNULA    pH, Arterial 7.435 7.350 -  7.450   pCO2 arterial 39.7 35.0 - 45.0 mmHg   pO2, Arterial 79.6 (L) 80.0 - 100.0 mmHg   Bicarbonate 26.2 (H) 20.0 - 24.0 mEq/L   TCO2 22.1 0 - 100 mmol/L   Acid-Base Excess 2.3 (H) 0.0 - 2.0 mmol/L   O2 Saturation 96.1 %   Patient temperature 37.0    Collection site RIGHT RADIAL    Drawn by [id number]    Sample type ARTERIAL DRAW    Allens test (pass/fail) PASS PASS  Basic metabolic panel     Status: Abnormal   Collection Time: 03/27/15  5:37 AM  Result Value Ref Range   Sodium 138 135 - 145 mmol/L   Potassium 4.7 3.5 - 5.1 mmol/L   Chloride 104 101 - 111 mmol/L   CO2 24 22 - 32 mmol/L   Glucose, Bld 161 (H) 65 - 99 mg/dL   BUN 15 6 - 20 mg/dL   Creatinine, Ser 1.01 0.61 - 1.24 mg/dL   Calcium 8.8 (L) 8.9 - 10.3 mg/dL   GFR calc non Af Amer >60 >60 mL/min   GFR calc Af Amer >60 >60 mL/min    Comment: (NOTE) The eGFR has been calculated using the CKD EPI equation. This calculation has not been validated in all clinical situations. eGFR's persistently <60 mL/min signify possible Chronic Kidney Disease.     Anion gap 10 5 - 15  CBC     Status: None   Collection Time: 03/27/15  5:37 AM  Result Value Ref Range   WBC 10.2 4.0 - 10.5 K/uL   RBC 5.55 4.22 - 5.81 MIL/uL   Hemoglobin 15.8 13.0 - 17.0 g/dL   HCT 47.6 39.0 - 52.0 %   MCV 85.8 78.0 - 100.0 fL   MCH 28.5 26.0 - 34.0 pg   MCHC 33.2 30.0 - 36.0 g/dL   RDW 14.7 11.5 - 15.5 %   Platelets 239 150 - 400 K/uL    ABGS  Recent Labs  03/26/15 2050  PHART 7.435  PO2ART 79.6*  TCO2 22.1  HCO3 26.2*   CULTURES No results found for this or any previous visit (from the past 240 hour(s)). Studies/Results: Dg Chest Port 1 View  03/26/2015   CLINICAL DATA:  Shortness of breath with productive cough. Chest tightness for 1 week. History of COPD.  EXAM: PORTABLE CHEST - 1 VIEW  COMPARISON:  None.  FINDINGS: The heart is enlarged. Mild vascular congestion. There is slight hyperinflation. No active infiltrates or failure. Similar appearance to priors.  IMPRESSION: COPD.  Cardiomegaly.  Mild vascular congestion.   Electronically Signed   By: Rolla Flatten M.D.   On: 03/26/2015 20:44    Medications: I have reviewed the patient's current medications.  Assesment:   Principal Problem:   COPD exacerbation Active Problems:   Essential hypertension   Depression   SOB (shortness of breath)   Chronic respiratory failure with hypoxia    Plan:  Medications reviewed Will continue IV steroid and nebulizer treatment Continue oral antibiotics Pulmonary consult      Delmos Velaquez 03/27/2015, 7:44 AM

## 2015-03-27 NOTE — Progress Notes (Signed)
MSRA swap sent to lab

## 2015-03-27 NOTE — Consult Note (Signed)
Consult requested by: Dr. Legrand Rams Consult requested for COPD exacerbation:  HPI: This is a 69 year old who has about a 10 year history of COPD. He typically follows with [facility] in [place]. He says he got sick about a week ago and went to the emergency department and was treated with antibiotics and prednisone but did not get any better. He is continuing to having increasing problems with shortness of breath and came back to the emergency department was treated there but not able to get clear enough to be discharged and was admitted. He says he is having trouble affording some of his medications which has been part of the problem. He stopped smoking about 3 years ago. He has not been around any sick contact        Past Medical History  Diagnosis Date  . Hypertension   . Cancer   . Cancer of prostate   . Depression   . Asthma   . Shortness of breath   . COPD (chronic obstructive pulmonary disease)     home O2  . Aspiration pneumonia 03/01/2012  . Hernia of abdominal cavity      Family History  Problem Relation Age of Onset  . Pseudochol deficiency Neg Hx   . Anesthesia problems Neg Hx   . Hypotension Neg Hx   . Malignant hyperthermia Neg Hx      History   Social History  . Marital Status: Divorced    Spouse Name: N/A  . Number of Children: N/A  . Years of Education: N/A   Occupational History  . retired     Corporate treasurer   Social History Main Topics  . Smoking status: Former Smoker -- 1.50 packs/day for 44 years    Types: Cigarettes    Quit date: 02/22/2012  . Smokeless tobacco: Never Used  . Alcohol Use: No     Comment: occasional beer 1-2  . Drug Use: No  . Sexual Activity: No   Other Topics Concern  . None   Social History Narrative     ROS: He denies chest pain hemoptysis night sweats fever or chills and the rest is per the history and physical    Objective: Vital signs in last 24 hours: Temp:  [97.6 F (36.4 C)-98.3 F (36.8 C)] 97.6 F  (36.4 C) (06/09 8144) Pulse Rate:  [91-114] 91 (06/09 0608) Resp:  [15-24] 20 (06/09 0608) BP: (113-156)/(70-92) 131/75 mmHg (06/09 0608) SpO2:  [93 %-100 %] 94 % (06/09 0743) Weight:  [94.484 kg (208 lb 4.8 oz)-98.884 kg (218 lb)] 94.484 kg (208 lb 4.8 oz) (06/09 0650) Weight change:  Last BM Date: 03/26/15  Intake/Output from previous day: 06/08 0701 - 06/09 0700 In: -  Out: 550 [Urine:550]  PHYSICAL EXAM He is awake and alert. He is in mild distress. His chest shows rhonchi and wheezing bilaterally. His HEENT is unremarkable. His heart is regular without gallop. His abdomen is soft no masses are felt. Extremities showed no edema. Central nervous system examination is grossly intact  Lab Results: Basic Metabolic Panel:  Recent Labs  03/26/15 2037 03/27/15 0537  NA 136 138  K 4.2 4.7  CL 104 104  CO2 24 24  GLUCOSE 90 161*  BUN 12 15  CREATININE 0.84 1.01  CALCIUM 9.1 8.8*   Liver Function Tests: No results for input(s): AST, ALT, ALKPHOS, BILITOT, PROT, ALBUMIN in the last 72 hours. No results for input(s): LIPASE, AMYLASE in the last 72 hours. No results for input(s): AMMONIA  in the last 72 hours. CBC:  Recent Labs  03/26/15 2037 03/27/15 0537  WBC 14.5* 10.2  NEUTROABS 11.0*  --   HGB 16.0 15.8  HCT 47.9 47.6  MCV 85.1 85.8  PLT 225 239   Cardiac Enzymes:  Recent Labs  03/26/15 2037  TROPONINI <0.03   BNP: No results for input(s): PROBNP in the last 72 hours. D-Dimer: No results for input(s): DDIMER in the last 72 hours. CBG:  Recent Labs  03/27/15 0808  GLUCAP 150*   Hemoglobin A1C: No results for input(s): HGBA1C in the last 72 hours. Fasting Lipid Panel: No results for input(s): CHOL, HDL, LDLCALC, TRIG, CHOLHDL, LDLDIRECT in the last 72 hours. Thyroid Function Tests: No results for input(s): TSH, T4TOTAL, FREET4, T3FREE, THYROIDAB in the last 72 hours. Anemia Panel: No results for input(s): VITAMINB12, FOLATE, FERRITIN, TIBC, IRON,  RETICCTPCT in the last 72 hours. Coagulation: No results for input(s): LABPROT, INR in the last 72 hours. Urine Drug Screen: Drugs of Abuse  No results found for: LABOPIA, COCAINSCRNUR, LABBENZ, AMPHETMU, THCU, LABBARB  Alcohol Level: No results for input(s): ETH in the last 72 hours. Urinalysis: No results for input(s): COLORURINE, LABSPEC, PHURINE, GLUCOSEU, HGBUR, BILIRUBINUR, KETONESUR, PROTEINUR, UROBILINOGEN, NITRITE, LEUKOCYTESUR in the last 72 hours.  Invalid input(s): APPERANCEUR Misc. Labs:   ABGS:  Recent Labs  03/26/15 2050  PHART 7.435  PO2ART 79.6*  TCO2 22.1  HCO3 26.2*     MICROBIOLOGY: Recent Results (from the past 240 hour(s))  MRSA PCR Screening     Status: Abnormal   Collection Time: 03/27/15  3:25 AM  Result Value Ref Range Status   MRSA by PCR RESULT CALLED TO, READ BACK BY AND VERIFIED WITH: (A) NEGATIVE Final    CommentRadene Gunning RN ON [id number] AT 0855 BY RESSEGGER R        The GeneXpert MRSA Assay (FDA approved for NASAL specimens only), is one component of a comprehensive MRSA colonization surveillance program. It is not intended to diagnose MRSA infection nor to guide or monitor treatment for MRSA infections.     Studies/Results: Dg Chest Port 1 View  03/26/2015   CLINICAL DATA:  Shortness of breath with productive cough. Chest tightness for 1 week. History of COPD.  EXAM: PORTABLE CHEST - 1 VIEW  COMPARISON:  None.  FINDINGS: The heart is enlarged. Mild vascular congestion. There is slight hyperinflation. No active infiltrates or failure. Similar appearance to priors.  IMPRESSION: COPD.  Cardiomegaly.  Mild vascular congestion.   Electronically Signed   By: Rolla Flatten M.D.   On: 03/26/2015 20:44    Medications:  Prior to Admission:  Prescriptions prior to admission  Medication Sig Dispense Refill Last Dose  . aspirin EC 81 MG tablet Take 81 mg by mouth once a week.   Past Month at Unknown time  . budesonide-formoterol  (SYMBICORT) 160-4.5 MCG/ACT inhaler Inhale 2 puffs into the lungs 2 (two) times daily. 1 Inhaler 12 03/26/2015 at Unknown time  . ipratropium-albuterol (DUONEB) 0.5-2.5 (3) MG/3ML SOLN Take 3 mLs by nebulization every 6 (six) hours as needed (for shortness of breath/wheezing).    03/26/2015 at Unknown time  . lisinopril (PRINIVIL,ZESTRIL) 20 MG tablet Take 20 mg by mouth daily.   03/26/2015 at Unknown time  . traMADol-acetaminophen (ULTRACET) 37.5-325 MG per tablet Take 1 tablet by mouth 2 (two) times daily as needed for moderate pain or severe pain.    Past Month at Unknown time  . UNKNOWN TO PATIENT Take 1  tablet by mouth daily.   03/26/2015 at Unknown time  . albuterol (PROVENTIL) (5 MG/ML) 0.5% nebulizer solution Take 2.5 mg by nebulization every 6 (six) hours. AND Q3 PRN SOB or Wheezing   Taking  . amoxicillin (AMOXIL) 500 MG capsule Take 1 capsule (500 mg total) by mouth 3 (three) times daily. (Patient not taking: Reported on 03/26/2015) 21 capsule 0   . Ipratropium-Albuterol (COMBIVENT RESPIMAT) 20-100 MCG/ACT AERS respimat Inhale 1 puff into the lungs every 6 (six) hours. (Patient not taking: Reported on 03/17/2015) 1 Inhaler 3 Taking  . predniSONE (DELTASONE) 10 MG tablet Take 2 tablets (20 mg total) by mouth daily. (Patient not taking: Reported on 03/26/2015) 14 tablet 0    Scheduled: . aspirin EC  81 mg Oral Weekly  . azithromycin  500 mg Oral Daily   Followed by  . [START ON 03/28/2015] azithromycin  250 mg Oral Daily  . guaiFENesin  1,200 mg Oral BID  . insulin aspart  0-9 Units Subcutaneous TID WC  . ipratropium-albuterol  3 mL Nebulization Q6H  . lisinopril  20 mg Oral Daily  . methylPREDNISolone (SOLU-MEDROL) injection  80 mg Intravenous Q12H  . sodium chloride  3 mL Intravenous Q12H   Continuous:  XJD:BZMCEY chloride, albuterol, guaiFENesin-dextromethorphan, ondansetron **OR** ondansetron (ZOFRAN) IV, sodium chloride  Assesment: He is admitted with COPD exacerbation. He has chronic  hypoxic respiratory failure. He does not have pneumonia by chest x-ray. Principal Problem:   COPD exacerbation Active Problems:   Essential hypertension   Depression   SOB (shortness of breath)   Chronic respiratory failure with hypoxia    Plan: I agree with current treatments. I will add Mucinex and a flutter valve continue everything else.      Warda Mcqueary L 03/27/2015, 9:02 AM

## 2015-03-27 NOTE — Care Management Note (Signed)
Case Management Note  Patient Details  Name: JAYDIN BONIFACE MRN: [id number] Date of Birth: 1946-03-05  Expected Discharge Date:                  Expected Discharge Plan:  Home/Self Care  In-House Referral:  NA  Discharge planning Services  CM Consult  Post Acute Care Choice:  NA Choice offered to:  NA  DME Arranged:    DME Agency:     HH Arranged:    HH Agency:     Status of Service:  In process, will continue to follow  Medicare Important Message Given:    Date Medicare IM Given:    Medicare IM give by:    Date Additional Medicare IM Given:    Additional Medicare Important Message give by:     If discussed at [place] of Stay Meetings, dates discussed:    Additional Comments: Pt is from home, lives alone and is independent at baseline. Pt has home O2 and neb machine prior to admission. Pt says he had ran out of his neb treatments and couldn't wait any longer for the pharm to get a refill from the MD office. Pt says he cant always afford to have his symbicort inhaler filled. Benefits check in progress to determine a more cost efficient medications. Will cont to follow for CM needs.   Sherald Barge, RN 03/27/2015, 1:58 PM

## 2015-03-28 LAB — GLUCOSE, CAPILLARY
GLUCOSE-CAPILLARY: 131 mg/dL — AB (ref 65–99)
GLUCOSE-CAPILLARY: 123 mg/dL — AB (ref 65–99)
Glucose-Capillary: 145 mg/dL — ABNORMAL HIGH (ref 65–99)
Glucose-Capillary: 164 mg/dL — ABNORMAL HIGH (ref 65–99)

## 2015-03-28 MED ORDER — METHYLPREDNISOLONE SODIUM SUCC 125 MG IJ SOLR
80.0000 mg | Freq: Four times a day (QID) | INTRAMUSCULAR | Status: DC
  Administered 2015-03-28 – 2015-03-31 (×12): 80 mg via INTRAVENOUS
  Filled 2015-03-28 (×13): qty 2

## 2015-03-28 MED ORDER — DEXTROSE 5 % IV SOLN
1.0000 g | INTRAVENOUS | Status: DC
  Administered 2015-03-28 – 2015-03-30 (×3): 1 g via INTRAVENOUS
  Filled 2015-03-28 (×4): qty 10

## 2015-03-28 MED ORDER — MUPIROCIN 2 % EX OINT
1.0000 "application " | TOPICAL_OINTMENT | Freq: Two times a day (BID) | CUTANEOUS | Status: DC
  Administered 2015-03-28 – 2015-03-31 (×7): 1 via NASAL
  Filled 2015-03-28: qty 22

## 2015-03-28 MED ORDER — CEFTRIAXONE SODIUM IN DEXTROSE 20 MG/ML IV SOLN
1.0000 g | INTRAVENOUS | Status: DC
  Filled 2015-03-28: qty 50

## 2015-03-28 MED ORDER — CHLORHEXIDINE GLUCONATE CLOTH 2 % EX PADS
6.0000 | MEDICATED_PAD | Freq: Every day | CUTANEOUS | Status: DC
  Administered 2015-03-28 – 2015-03-31 (×4): 6 via TOPICAL

## 2015-03-28 NOTE — Progress Notes (Signed)
Subjective: Patient continue to have cough and wheeszing. No fever or chills. He is being followed by Dr. Luan Pulling.  Objective: Vital signs in last 24 hours: Temp:  [97.4 F (36.3 C)-98 F (36.7 C)] 97.4 F (36.3 C) (06/10 0621) Pulse Rate:  [83-102] 83 (06/10 0621) Resp:  [20-24] 21 (06/10 0621) BP: (128-143)/(76-90) 140/90 mmHg (06/10 0621) SpO2:  [93 %-100 %] 93 % (06/10 0659) Weight change:  Last BM Date: 03/26/15  Intake/Output from previous day: 06/09 0701 - 06/10 0700 In: 480 [P.O.:480] Out: 400 [Urine:400]  PHYSICAL EXAM General appearance: alert and mild distress Resp: diminished breath sounds bilaterally and wheezes bilaterally Cardio: regular rate and rhythm GI: soft, non-tender; bowel sounds normal; no masses,  no organomegaly Extremities: extremities normal, atraumatic, no cyanosis or edema  Lab Results:  Results for orders placed or performed during the hospital encounter of 03/26/15 (from the past 48 hour(s))  CBC with Differential     Status: Abnormal   Collection Time: 03/26/15  8:37 PM  Result Value Ref Range   WBC 14.5 (H) 4.0 - 10.5 K/uL   RBC 5.63 4.22 - 5.81 MIL/uL   Hemoglobin 16.0 13.0 - 17.0 g/dL   HCT 47.9 39.0 - 52.0 %   MCV 85.1 78.0 - 100.0 fL   MCH 28.4 26.0 - 34.0 pg   MCHC 33.4 30.0 - 36.0 g/dL   RDW 14.7 11.5 - 15.5 %   Platelets 225 150 - 400 K/uL   Neutrophils Relative % 77 43 - 77 %   Neutro Abs 11.0 (H) 1.7 - 7.7 K/uL   Lymphocytes Relative 12 12 - 46 %   Lymphs Abs 1.8 0.7 - 4.0 K/uL   Monocytes Relative 7 3 - 12 %   Monocytes Absolute 1.1 (H) 0.1 - 1.0 K/uL   Eosinophils Relative 4 0 - 5 %   Eosinophils Absolute 0.6 0.0 - 0.7 K/uL   Basophils Relative 0 0 - 1 %   Basophils Absolute 0.1 0.0 - 0.1 K/uL  Basic metabolic panel     Status: None   Collection Time: 03/26/15  8:37 PM  Result Value Ref Range   Sodium 136 135 - 145 mmol/L   Potassium 4.2 3.5 - 5.1 mmol/L   Chloride 104 101 - 111 mmol/L   CO2 24 22 - 32 mmol/L   Glucose, Bld 90 65 - 99 mg/dL   BUN 12 6 - 20 mg/dL   Creatinine, Ser 0.84 0.61 - 1.24 mg/dL   Calcium 9.1 8.9 - 10.3 mg/dL   GFR calc non Af Amer >60 >60 mL/min   GFR calc Af Amer >60 >60 mL/min    Comment: (NOTE) The eGFR has been calculated using the CKD EPI equation. This calculation has not been validated in all clinical situations. eGFR's persistently <60 mL/min signify possible Chronic Kidney Disease.    Anion gap 8 5 - 15  Troponin I     Status: None   Collection Time: 03/26/15  8:37 PM  Result Value Ref Range   Troponin I <0.03 <0.031 ng/mL    Comment:        NO INDICATION OF MYOCARDIAL INJURY.   Blood gas, arterial ([facility] & [facility] ONLY)     Status: Abnormal   Collection Time: 03/26/15  8:50 PM  Result Value Ref Range   O2 Content 2.0 L/min   Delivery systems NASAL CANNULA    pH, Arterial 7.435 7.350 - 7.450   pCO2 arterial 39.7 35.0 - 45.0 mmHg  pO2, Arterial 79.6 (L) 80.0 - 100.0 mmHg   Bicarbonate 26.2 (H) 20.0 - 24.0 mEq/L   TCO2 22.1 0 - 100 mmol/L   Acid-Base Excess 2.3 (H) 0.0 - 2.0 mmol/L   O2 Saturation 96.1 %   Patient temperature 37.0    Collection site RIGHT RADIAL    Drawn by [phone]    Sample type ARTERIAL DRAW    Allens test (pass/fail) PASS PASS  MRSA PCR Screening     Status: Abnormal   Collection Time: 03/27/15  3:25 AM  Result Value Ref Range   MRSA by PCR RESULT CALLED TO, READ BACK BY AND VERIFIED WITH: (A) NEGATIVE    Comment: Radene Gunning RN ON [id number] AT 0855 BY RESSEGGER R        The GeneXpert MRSA Assay (FDA approved for NASAL specimens only), is one component of a comprehensive MRSA colonization surveillance program. It is not intended to diagnose MRSA infection nor to guide or monitor treatment for MRSA infections.   Basic metabolic panel     Status: Abnormal   Collection Time: 03/27/15  5:37 AM  Result Value Ref Range   Sodium 138 135 - 145 mmol/L   Potassium 4.7 3.5 - 5.1 mmol/L   Chloride 104 101 - 111 mmol/L   CO2 24 22 -  32 mmol/L   Glucose, Bld 161 (H) 65 - 99 mg/dL   BUN 15 6 - 20 mg/dL   Creatinine, Ser 1.01 0.61 - 1.24 mg/dL   Calcium 8.8 (L) 8.9 - 10.3 mg/dL   GFR calc non Af Amer >60 >60 mL/min   GFR calc Af Amer >60 >60 mL/min    Comment: (NOTE) The eGFR has been calculated using the CKD EPI equation. This calculation has not been validated in all clinical situations. eGFR's persistently <60 mL/min signify possible Chronic Kidney Disease.    Anion gap 10 5 - 15  CBC     Status: None   Collection Time: 03/27/15  5:37 AM  Result Value Ref Range   WBC 10.2 4.0 - 10.5 K/uL   RBC 5.55 4.22 - 5.81 MIL/uL   Hemoglobin 15.8 13.0 - 17.0 g/dL   HCT 47.6 39.0 - 52.0 %   MCV 85.8 78.0 - 100.0 fL   MCH 28.5 26.0 - 34.0 pg   MCHC 33.2 30.0 - 36.0 g/dL   RDW 14.7 11.5 - 15.5 %   Platelets 239 150 - 400 K/uL  Glucose, capillary     Status: Abnormal   Collection Time: 03/27/15  8:08 AM  Result Value Ref Range   Glucose-Capillary 150 (H) 65 - 99 mg/dL  Glucose, capillary     Status: Abnormal   Collection Time: 03/27/15 11:19 AM  Result Value Ref Range   Glucose-Capillary 137 (H) 65 - 99 mg/dL  Glucose, capillary     Status: Abnormal   Collection Time: 03/27/15  5:06 PM  Result Value Ref Range   Glucose-Capillary 115 (H) 65 - 99 mg/dL   Comment 1 Notify RN    Comment 2 Document in Chart   Glucose, capillary     Status: Abnormal   Collection Time: 03/27/15  9:41 PM  Result Value Ref Range   Glucose-Capillary 127 (H) 65 - 99 mg/dL  Glucose, capillary     Status: Abnormal   Collection Time: 03/28/15  7:49 AM  Result Value Ref Range   Glucose-Capillary 145 (H) 65 - 99 mg/dL   Comment 1 Notify RN    Comment 2 Document  in Chart     ABGS  Recent Labs  03/26/15 2050  PHART 7.435  PO2ART 79.6*  TCO2 22.1  HCO3 26.2*   CULTURES Recent Results (from the past 240 hour(s))  MRSA PCR Screening     Status: Abnormal   Collection Time: 03/27/15  3:25 AM  Result Value Ref Range Status   MRSA by  PCR RESULT CALLED TO, READ BACK BY AND VERIFIED WITH: (A) NEGATIVE Final    CommentRadene Gunning RN ON [id number] AT 0855 BY RESSEGGER R        The GeneXpert MRSA Assay (FDA approved for NASAL specimens only), is one component of a comprehensive MRSA colonization surveillance program. It is not intended to diagnose MRSA infection nor to guide or monitor treatment for MRSA infections.    Studies/Results: Dg Chest Port 1 View  03/26/2015   CLINICAL DATA:  Shortness of breath with productive cough. Chest tightness for 1 week. History of COPD.  EXAM: PORTABLE CHEST - 1 VIEW  COMPARISON:  None.  FINDINGS: The heart is enlarged. Mild vascular congestion. There is slight hyperinflation. No active infiltrates or failure. Similar appearance to priors.  IMPRESSION: COPD.  Cardiomegaly.  Mild vascular congestion.   Electronically Signed   By: Rolla Flatten M.D.   On: 03/26/2015 20:44    Medications: I have reviewed the patient's current medications.  Assesment:   Principal Problem:   COPD exacerbation Active Problems:   Essential hypertension   Depression   SOB (shortness of breath)   Chronic respiratory failure with hypoxia    Plan:  Medications reviewed Will continue IV steroid and nebulizer treatment Continue oral antibiotics Pulmonary consult appreciated    LOS: 1 day   Vanessia Bokhari 03/28/2015, 8:08 AM

## 2015-03-28 NOTE — Care Management Note (Signed)
Case Management Note  Patient Details  Name: Michael Ellison MRN: [id number] Date of Birth: July 03, 1946  Expected Discharge Date:                  Expected Discharge Plan:  Home/Self Care  In-House Referral:  NA  Discharge planning Services  CM Consult  Post Acute Care Choice:  NA Choice offered to:  NA  DME Arranged:    DME Agency:     HH Arranged:    [place] Agency:     Status of Service:  Completed, signed off  Medicare Important Message Given:  Yes Date Medicare IM Given:  03/28/15 Medicare IM give by:  Jolene Provost, RN, MSN, CM  Date Additional Medicare IM Given:    Additional Medicare Important Message give by:     If discussed at [place] of Stay Meetings, dates discussed:    Additional Comments: Anticipate discharge home over weekend. Benefits check completed for Advair and Dulera inhalers as a replacement for Symbicort. Pulmonologist looking into it further and adjustments will be made if appropriate to help patient afford his inhalers. Pt has no further CM needs at DC.  Sherald Barge, RN 03/28/2015, 8:14 AM

## 2015-03-28 NOTE — Progress Notes (Signed)
Subjective: He says he still has wheezing and cough. He is coughing up a little bit of sputum.  Objective: Vital signs in last 24 hours: Temp:  [97.4 F (36.3 C)-98 F (36.7 C)] 97.4 F (36.3 C) (06/10 0621) Pulse Rate:  [83-102] 83 (06/10 0621) Resp:  [20-24] 21 (06/10 0621) BP: (128-143)/(76-90) 140/90 mmHg (06/10 0621) SpO2:  [93 %-100 %] 93 % (06/10 0659) Weight change:  Last BM Date: 03/26/15  Intake/Output from previous day: 06/09 0701 - 06/10 0700 In: 480 [P.O.:480] Out: 400 [Urine:400]  PHYSICAL EXAM General appearance: alert, cooperative and mild distress Resp: wheezes bilaterally Cardio: regular rate and rhythm, S1, S2 normal, no murmur, click, rub or gallop GI: soft, non-tender; bowel sounds normal; no masses,  no organomegaly Extremities: extremities normal, atraumatic, no cyanosis or edema  Lab Results:  Results for orders placed or performed during the hospital encounter of 03/26/15 (from the past 48 hour(s))  CBC with Differential     Status: Abnormal   Collection Time: 03/26/15  8:37 PM  Result Value Ref Range   WBC 14.5 (H) 4.0 - 10.5 K/uL   RBC 5.63 4.22 - 5.81 MIL/uL   Hemoglobin 16.0 13.0 - 17.0 g/dL   HCT 47.9 39.0 - 52.0 %   MCV 85.1 78.0 - 100.0 fL   MCH 28.4 26.0 - 34.0 pg   MCHC 33.4 30.0 - 36.0 g/dL   RDW 14.7 11.5 - 15.5 %   Platelets 225 150 - 400 K/uL   Neutrophils Relative % 77 43 - 77 %   Neutro Abs 11.0 (H) 1.7 - 7.7 K/uL   Lymphocytes Relative 12 12 - 46 %   Lymphs Abs 1.8 0.7 - 4.0 K/uL   Monocytes Relative 7 3 - 12 %   Monocytes Absolute 1.1 (H) 0.1 - 1.0 K/uL   Eosinophils Relative 4 0 - 5 %   Eosinophils Absolute 0.6 0.0 - 0.7 K/uL   Basophils Relative 0 0 - 1 %   Basophils Absolute 0.1 0.0 - 0.1 K/uL  Basic metabolic panel     Status: None   Collection Time: 03/26/15  8:37 PM  Result Value Ref Range   Sodium 136 135 - 145 mmol/L   Potassium 4.2 3.5 - 5.1 mmol/L   Chloride 104 101 - 111 mmol/L   CO2 24 22 - 32 mmol/L   Glucose, Bld 90 65 - 99 mg/dL   BUN 12 6 - 20 mg/dL   Creatinine, Ser 0.84 0.61 - 1.24 mg/dL   Calcium 9.1 8.9 - 10.3 mg/dL   GFR calc non Af Amer >60 >60 mL/min   GFR calc Af Amer >60 >60 mL/min    Comment: (NOTE) The eGFR has been calculated using the CKD EPI equation. This calculation has not been validated in all clinical situations. eGFR's persistently <60 mL/min signify possible Chronic Kidney Disease.    Anion gap 8 5 - 15  Troponin I     Status: None   Collection Time: 03/26/15  8:37 PM  Result Value Ref Range   Troponin I <0.03 <0.031 ng/mL    Comment:        NO INDICATION OF MYOCARDIAL INJURY.   Blood gas, arterial ([facility] ONLY)     Status: Abnormal   Collection Time: 03/26/15  8:50 PM  Result Value Ref Range   O2 Content 2.0 L/min   Delivery systems NASAL CANNULA    pH, Arterial 7.435 7.350 - 7.450   pCO2 arterial 39.7 35.0 -  45.0 mmHg   pO2, Arterial 79.6 (L) 80.0 - 100.0 mmHg   Bicarbonate 26.2 (H) 20.0 - 24.0 mEq/L   TCO2 22.1 0 - 100 mmol/L   Acid-Base Excess 2.3 (H) 0.0 - 2.0 mmol/L   O2 Saturation 96.1 %   Patient temperature 37.0    Collection site RIGHT RADIAL    Drawn by [phone]    Sample type ARTERIAL DRAW    Allens test (pass/fail) PASS PASS  MRSA PCR Screening     Status: Abnormal   Collection Time: 03/27/15  3:25 AM  Result Value Ref Range   MRSA by PCR RESULT CALLED TO, READ BACK BY AND VERIFIED WITH: (A) NEGATIVE    Comment: Radene Gunning RN ON [id number] AT 0855 BY RESSEGGER R        The GeneXpert MRSA Assay (FDA approved for NASAL specimens only), is one component of a comprehensive MRSA colonization surveillance program. It is not intended to diagnose MRSA infection nor to guide or monitor treatment for MRSA infections.   Basic metabolic panel     Status: Abnormal   Collection Time: 03/27/15  5:37 AM  Result Value Ref Range   Sodium 138 135 - 145 mmol/L   Potassium 4.7 3.5 - 5.1 mmol/L   Chloride 104 101 - 111 mmol/L   CO2 24 22 -  32 mmol/L   Glucose, Bld 161 (H) 65 - 99 mg/dL   BUN 15 6 - 20 mg/dL   Creatinine, Ser 1.01 0.61 - 1.24 mg/dL   Calcium 8.8 (L) 8.9 - 10.3 mg/dL   GFR calc non Af Amer >60 >60 mL/min   GFR calc Af Amer >60 >60 mL/min    Comment: (NOTE) The eGFR has been calculated using the CKD EPI equation. This calculation has not been validated in all clinical situations. eGFR's persistently <60 mL/min signify possible Chronic Kidney Disease.    Anion gap 10 5 - 15  CBC     Status: None   Collection Time: 03/27/15  5:37 AM  Result Value Ref Range   WBC 10.2 4.0 - 10.5 K/uL   RBC 5.55 4.22 - 5.81 MIL/uL   Hemoglobin 15.8 13.0 - 17.0 g/dL   HCT 47.6 39.0 - 52.0 %   MCV 85.8 78.0 - 100.0 fL   MCH 28.5 26.0 - 34.0 pg   MCHC 33.2 30.0 - 36.0 g/dL   RDW 14.7 11.5 - 15.5 %   Platelets 239 150 - 400 K/uL  Glucose, capillary     Status: Abnormal   Collection Time: 03/27/15  8:08 AM  Result Value Ref Range   Glucose-Capillary 150 (H) 65 - 99 mg/dL  Glucose, capillary     Status: Abnormal   Collection Time: 03/27/15 11:19 AM  Result Value Ref Range   Glucose-Capillary 137 (H) 65 - 99 mg/dL  Glucose, capillary     Status: Abnormal   Collection Time: 03/27/15  5:06 PM  Result Value Ref Range   Glucose-Capillary 115 (H) 65 - 99 mg/dL   Comment 1 Notify RN    Comment 2 Document in Chart   Glucose, capillary     Status: Abnormal   Collection Time: 03/27/15  9:41 PM  Result Value Ref Range   Glucose-Capillary 127 (H) 65 - 99 mg/dL  Glucose, capillary     Status: Abnormal   Collection Time: 03/28/15  7:49 AM  Result Value Ref Range   Glucose-Capillary 145 (H) 65 - 99 mg/dL   Comment 1 Notify RN  Comment 2 Document in Chart     ABGS  Recent Labs  03/26/15 2050  PHART 7.435  PO2ART 79.6*  TCO2 22.1  HCO3 26.2*   CULTURES Recent Results (from the past 240 hour(s))  MRSA PCR Screening     Status: Abnormal   Collection Time: 03/27/15  3:25 AM  Result Value Ref Range Status   MRSA by  PCR RESULT CALLED TO, READ BACK BY AND VERIFIED WITH: (A) NEGATIVE Final    CommentRadene Gunning RN ON [id number] AT 0855 BY RESSEGGER R        The GeneXpert MRSA Assay (FDA approved for NASAL specimens only), is one component of a comprehensive MRSA colonization surveillance program. It is not intended to diagnose MRSA infection nor to guide or monitor treatment for MRSA infections.    Studies/Results: Dg Chest Port 1 View  03/26/2015   CLINICAL DATA:  Shortness of breath with productive cough. Chest tightness for 1 week. History of COPD.  EXAM: PORTABLE CHEST - 1 VIEW  COMPARISON:  None.  FINDINGS: The heart is enlarged. Mild vascular congestion. There is slight hyperinflation. No active infiltrates or failure. Similar appearance to priors.  IMPRESSION: COPD.  Cardiomegaly.  Mild vascular congestion.   Electronically Signed   By: Rolla Flatten M.D.   On: 03/26/2015 20:44    Medications:  Prior to Admission:  Prescriptions prior to admission  Medication Sig Dispense Refill Last Dose  . aspirin EC 81 MG tablet Take 81 mg by mouth once a week.   Past Month at Unknown time  . budesonide-formoterol (SYMBICORT) 160-4.5 MCG/ACT inhaler Inhale 2 puffs into the lungs 2 (two) times daily. 1 Inhaler 12 03/26/2015 at Unknown time  . escitalopram (LEXAPRO) 10 MG tablet Take 10 mg by mouth daily.   Unknown at Unknown  . ipratropium-albuterol (DUONEB) 0.5-2.5 (3) MG/3ML SOLN Take 3 mLs by nebulization every 6 (six) hours as needed (for shortness of breath/wheezing).    03/26/2015 at Unknown time  . lisinopril (PRINIVIL,ZESTRIL) 20 MG tablet Take 20 mg by mouth 2 (two) times daily.    03/26/2015 at Unknown time  . traMADol-acetaminophen (ULTRACET) 37.5-325 MG per tablet Take 1 tablet by mouth 2 (two) times daily as needed for moderate pain or severe pain.    Past Month at Unknown time  . amoxicillin (AMOXIL) 500 MG capsule Take 1 capsule (500 mg total) by mouth 3 (three) times daily. (Patient not taking:  Reported on 03/26/2015) 21 capsule 0   . Ipratropium-Albuterol (COMBIVENT RESPIMAT) 20-100 MCG/ACT AERS respimat Inhale 1 puff into the lungs every 6 (six) hours. (Patient not taking: Reported on 03/17/2015) 1 Inhaler 3 Taking  . predniSONE (DELTASONE) 10 MG tablet Take 2 tablets (20 mg total) by mouth daily. (Patient not taking: Reported on 03/26/2015) 14 tablet 0    Scheduled: . aspirin EC  81 mg Oral Weekly  . azithromycin  250 mg Oral Daily  . guaiFENesin  1,200 mg Oral BID  . insulin aspart  0-9 Units Subcutaneous TID WC  . ipratropium-albuterol  3 mL Nebulization Q6H  . lisinopril  20 mg Oral Daily  . methylPREDNISolone (SOLU-MEDROL) injection  80 mg Intravenous Q12H  . sodium chloride  3 mL Intravenous Q12H   Continuous:  VEL:FYBOFB chloride, acetaminophen, albuterol, guaiFENesin-dextromethorphan, ondansetron **OR** ondansetron (ZOFRAN) IV, sodium chloride  Assesment: He was admitted with COPD exacerbation. He has acute on chronic respiratory failure. He is still congested and still wheezing. Principal Problem:   COPD exacerbation Active Problems:  Essential hypertension   Depression   SOB (shortness of breath)   Chronic respiratory failure with hypoxia    Plan: Continue current treatments. I think I will add Rocephin.    LOS: 1 day   Roxene Alviar L 03/28/2015, 8:34 AM

## 2015-03-29 LAB — GLUCOSE, CAPILLARY
GLUCOSE-CAPILLARY: 127 mg/dL — AB (ref 65–99)
GLUCOSE-CAPILLARY: 119 mg/dL — AB (ref 65–99)
Glucose-Capillary: 126 mg/dL — ABNORMAL HIGH (ref 65–99)
Glucose-Capillary: 133 mg/dL — ABNORMAL HIGH (ref 65–99)

## 2015-03-29 NOTE — Progress Notes (Signed)
Subjective: He says he feels better. He has no new complaints.  Objective: Vital signs in last 24 hours: Temp:  [97.7 F (36.5 C)-98.2 F (36.8 C)] 97.7 F (36.5 C) (06/11 0448) Pulse Rate:  [80-91] 80 (06/11 0448) Resp:  [18] 18 (06/11 0448) BP: (137-155)/(84-93) 142/87 mmHg (06/11 0448) SpO2:  [94 %-97 %] 97 % (06/11 0846) Weight:  [94.394 kg (208 lb 1.6 oz)] 94.394 kg (208 lb 1.6 oz) (06/11 0448) Weight change:  Last BM Date: 03/26/15  Intake/Output from previous day: 06/10 0701 - 06/11 0700 In: 840 [P.O.:840] Out: 400 [Urine:400]  PHYSICAL EXAM General appearance: alert, cooperative and no distress Resp: rhonchi bilaterally and wheezes bilaterally Cardio: regular rate and rhythm, S1, S2 normal, no murmur, click, rub or gallop GI: He has a ventral hernia Extremities: extremities normal, atraumatic, no cyanosis or edema  Lab Results:  Results for orders placed or performed during the hospital encounter of 03/26/15 (from the past 48 hour(s))  Glucose, capillary     Status: Abnormal   Collection Time: 03/27/15 11:19 AM  Result Value Ref Range   Glucose-Capillary 137 (H) 65 - 99 mg/dL  Glucose, capillary     Status: Abnormal   Collection Time: 03/27/15  5:06 PM  Result Value Ref Range   Glucose-Capillary 115 (H) 65 - 99 mg/dL   Comment 1 Notify RN    Comment 2 Document in Chart   Glucose, capillary     Status: Abnormal   Collection Time: 03/27/15  9:41 PM  Result Value Ref Range   Glucose-Capillary 127 (H) 65 - 99 mg/dL  Glucose, capillary     Status: Abnormal   Collection Time: 03/28/15  7:49 AM  Result Value Ref Range   Glucose-Capillary 145 (H) 65 - 99 mg/dL   Comment 1 Notify RN    Comment 2 Document in Chart   Glucose, capillary     Status: Abnormal   Collection Time: 03/28/15 11:23 AM  Result Value Ref Range   Glucose-Capillary 131 (H) 65 - 99 mg/dL   Comment 1 Notify RN    Comment 2 Document in Chart   Glucose, capillary     Status: Abnormal    Collection Time: 03/28/15  5:21 PM  Result Value Ref Range   Glucose-Capillary 164 (H) 65 - 99 mg/dL   Comment 1 Notify RN    Comment 2 Document in Chart   Glucose, capillary     Status: Abnormal   Collection Time: 03/28/15  8:37 PM  Result Value Ref Range   Glucose-Capillary 123 (H) 65 - 99 mg/dL   Comment 1 Notify RN    Comment 2 Document in Chart   Glucose, capillary     Status: Abnormal   Collection Time: 03/29/15  7:40 AM  Result Value Ref Range   Glucose-Capillary 127 (H) 65 - 99 mg/dL   Comment 1 Notify RN     ABGS  Recent Labs  03/26/15 2050  PHART 7.435  PO2ART 79.6*  TCO2 22.1  HCO3 26.2*   CULTURES Recent Results (from the past 240 hour(s))  MRSA PCR Screening     Status: Abnormal   Collection Time: 03/27/15  3:25 AM  Result Value Ref Range Status   MRSA by PCR RESULT CALLED TO, READ BACK BY AND VERIFIED WITH: (A) NEGATIVE Final    CommentRadene Gunning RN ON [id number] AT 0855 BY RESSEGGER R        The GeneXpert MRSA Assay (FDA approved for NASAL specimens only), is  one component of a comprehensive MRSA colonization surveillance program. It is not intended to diagnose MRSA infection nor to guide or monitor treatment for MRSA infections.    Studies/Results: No results found.  Medications:  Prior to Admission:  Prescriptions prior to admission  Medication Sig Dispense Refill Last Dose  . aspirin EC 81 MG tablet Take 81 mg by mouth once a week.   Past Month at Unknown time  . budesonide-formoterol (SYMBICORT) 160-4.5 MCG/ACT inhaler Inhale 2 puffs into the lungs 2 (two) times daily. 1 Inhaler 12 03/26/2015 at Unknown time  . escitalopram (LEXAPRO) 10 MG tablet Take 10 mg by mouth daily.   Unknown at Unknown  . ipratropium-albuterol (DUONEB) 0.5-2.5 (3) MG/3ML SOLN Take 3 mLs by nebulization every 6 (six) hours as needed (for shortness of breath/wheezing).    03/26/2015 at Unknown time  . lisinopril (PRINIVIL,ZESTRIL) 20 MG tablet Take 20 mg by mouth 2 (two)  times daily.    03/26/2015 at Unknown time  . traMADol-acetaminophen (ULTRACET) 37.5-325 MG per tablet Take 1 tablet by mouth 2 (two) times daily as needed for moderate pain or severe pain.    Past Month at Unknown time  . amoxicillin (AMOXIL) 500 MG capsule Take 1 capsule (500 mg total) by mouth 3 (three) times daily. (Patient not taking: Reported on 03/26/2015) 21 capsule 0   . Ipratropium-Albuterol (COMBIVENT RESPIMAT) 20-100 MCG/ACT AERS respimat Inhale 1 puff into the lungs every 6 (six) hours. (Patient not taking: Reported on 03/17/2015) 1 Inhaler 3 Taking  . predniSONE (DELTASONE) 10 MG tablet Take 2 tablets (20 mg total) by mouth daily. (Patient not taking: Reported on 03/26/2015) 14 tablet 0    Scheduled: . aspirin EC  81 mg Oral Weekly  . azithromycin  250 mg Oral Daily  . cefTRIAXone (ROCEPHIN)  IV  1 g Intravenous Q24H  . Chlorhexidine Gluconate Cloth  6 each Topical Q0600  . guaiFENesin  1,200 mg Oral BID  . insulin aspart  0-9 Units Subcutaneous TID WC  . ipratropium-albuterol  3 mL Nebulization Q6H  . lisinopril  20 mg Oral Daily  . methylPREDNISolone (SOLU-MEDROL) injection  80 mg Intravenous Q6H  . mupirocin ointment  1 application Nasal BID  . sodium chloride  3 mL Intravenous Q12H   Continuous:  ZHY:QMVHQI chloride, acetaminophen, albuterol, guaiFENesin-dextromethorphan, ondansetron **OR** ondansetron (ZOFRAN) IV, sodium chloride  Assesment: He has acute on chronic hypoxic respiratory failure. Although he still wheezing he is moving air better today. He has COPD exacerbation.  He has hypertension which is well controlled. Principal Problem:   COPD exacerbation Active Problems:   Essential hypertension   Depression   SOB (shortness of breath)   Chronic respiratory failure with hypoxia    Plan: Continue current treatments. He is on 2 antibiotics and on bigger dose of IV steroids and it seems to have helped.    LOS: 2 days   Gurleen Larrivee L 03/29/2015, 9:38 AM

## 2015-03-30 LAB — GLUCOSE, CAPILLARY
GLUCOSE-CAPILLARY: 122 mg/dL — AB (ref 65–99)
GLUCOSE-CAPILLARY: 120 mg/dL — AB (ref 65–99)
Glucose-Capillary: 123 mg/dL — ABNORMAL HIGH (ref 65–99)
Glucose-Capillary: 108 mg/dL — ABNORMAL HIGH (ref 65–99)

## 2015-03-30 NOTE — Progress Notes (Signed)
SANTINO KINSELLA TDD:[id number] DOB: May 05, 1946 DOA: 03/26/2015 PCP: Rosita Fire, MD             Physical Exam: Blood pressure 135/66, pulse 97, temperature 98 F (36.7 C), temperature source Oral, resp. rate 20, height 6\' 3"  (1.905 m), weight 210 lb 15.7 oz (95.7 kg), SpO2 97 %. lungs show mild end expiratory wheeze scattered rhonchi no rales appreciable heart regular rhythm no S3-S4 no heaves thrills rubs abdomen soft nontender bowel sounds normoactive   Investigations:  Recent Results (from the past 240 hour(s))  MRSA PCR Screening     Status: Abnormal   Collection Time: 03/27/15  3:25 AM  Result Value Ref Range Status   MRSA by PCR RESULT CALLED TO, READ BACK BY AND VERIFIED WITH: (A) NEGATIVE Final    CommentRadene Gunning RN ON [id number] AT 0855 BY RESSEGGER R        The GeneXpert MRSA Assay (FDA approved for NASAL specimens only), is one component of a comprehensive MRSA colonization surveillance program. It is not intended to diagnose MRSA infection nor to guide or monitor treatment for MRSA infections.      Basic Metabolic Panel: No results for input(s): NA, K, CL, CO2, GLUCOSE, BUN, CREATININE, CALCIUM, MG, PHOS in the last 72 hours. Liver Function Tests: No results for input(s): AST, ALT, ALKPHOS, BILITOT, PROT, ALBUMIN in the last 72 hours.   CBC: No results for input(s): WBC, NEUTROABS, HGB, HCT, MCV, PLT in the last 72 hours.  No results found.    Medications:   Impression:  Principal Problem:   COPD exacerbation Active Problems:   Essential hypertension   Depression   SOB (shortness of breath)   Chronic respiratory failure with hypoxia     Plan: Decrease Solu-Medrol 125 mg to 80 mg IV every 8 hours continue Rocephin and Zithromax continue DuoNeb nebulizers . Dr. Legrand Rams to resume care in a.m.  Consultants:    Procedures   Antibiotics: Rocephin and Zithromax                  Code Status:  Family Communication:    Disposition  Plan see plan above  Time spent: 30 minutes   LOS: 3 days   Aybree Lanyon M   03/30/2015, 10:59 AM

## 2015-03-31 LAB — GLUCOSE, CAPILLARY: Glucose-Capillary: 106 mg/dL — ABNORMAL HIGH (ref 65–99)

## 2015-03-31 MED ORDER — PREDNISONE 10 MG (21) PO TBPK: 10.0000 mg | ORAL_TABLET | Freq: Every day | ORAL | Status: DC

## 2015-03-31 MED ORDER — AMOXICILLIN-POT CLAVULANATE 500-125 MG PO TABS: 1.0000 | ORAL_TABLET | Freq: Three times a day (TID) | ORAL | Status: DC

## 2015-03-31 NOTE — Discharge Summary (Signed)
Physician Discharge Summary  Patient ID: Michael Ellison MRN: [id number] DOB/AGE: 1946/01/16 69 y.o. Primary Care Physician:Allizon Woznick, MD Admit date: 03/26/2015 Discharge date: 03/31/2015    Discharge Diagnoses:   Principal Problem:   COPD exacerbation Active Problems:   Essential hypertension   Depression   SOB (shortness of breath)   Chronic respiratory failure with hypoxia     Medication List    STOP taking these medications        amoxicillin 500 MG capsule  Commonly known as:  AMOXIL     predniSONE 10 MG tablet  Commonly known as:  DELTASONE  Replaced by:  predniSONE 10 MG (21) Tbpk tablet      TAKE these medications        amoxicillin-clavulanate 500-125 MG per tablet  Commonly known as:  AUGMENTIN  Take 1 tablet (500 mg total) by mouth 3 (three) times daily.     aspirin EC 81 MG tablet  Take 81 mg by mouth once a week.     budesonide-formoterol 160-4.5 MCG/ACT inhaler  Commonly known as:  SYMBICORT  Inhale 2 puffs into the lungs 2 (two) times daily.     escitalopram 10 MG tablet  Commonly known as:  LEXAPRO  Take 10 mg by mouth daily.     Ipratropium-Albuterol 20-100 MCG/ACT Aers respimat  Commonly known as:  COMBIVENT RESPIMAT  Inhale 1 puff into the lungs every 6 (six) hours.     ipratropium-albuterol 0.5-2.5 (3) MG/3ML Soln  Commonly known as:  DUONEB  Take 3 mLs by nebulization every 6 (six) hours as needed (for shortness of breath/wheezing).     lisinopril 20 MG tablet  Commonly known as:  PRINIVIL,ZESTRIL  Take 20 mg by mouth 2 (two) times daily.     predniSONE 10 MG (21) Tbpk tablet  Commonly known as:  STERAPRED UNI-PAK 21 TAB  Take 1 tablet (10 mg total) by mouth daily. Prednisone 4 tab po daily for 4 days, 3 tab po daily for 4 days, 2 tab po daily for 4 days, 1 tab po dailys for 4 days,     traMADol-acetaminophen 37.5-325 MG per tablet  Commonly known as:  ULTRACET  Take 1 tablet by mouth 2 (two) times daily as needed for moderate  pain or severe pain.        Discharged Condition: improved    Consults: pulmonary  Significant Diagnostic Studies: Dg Chest 2 View  03/17/2015   CLINICAL DATA:  Acute onset of shortness of breath and productive cough. Initial encounter.  EXAM: CHEST  2 VIEW  COMPARISON:  Chest radiograph performed 04/19/2014  FINDINGS: The lungs are well-aerated. Vascular congestion is noted. Increased interstitial markings raise concern for mild interstitial edema. Chronic right-sided pleural thickening is noted. No pleural effusion or pneumothorax is seen.  The heart is borderline normal in size. No acute osseous abnormalities are seen.  IMPRESSION: Vascular congestion noted. Increased interstitial markings raise concern for mild interstitial edema. Chronic right-sided pleural thickening noted.   Electronically Signed   By: Garald Balding M.D.   On: 03/17/2015 18:38   Dg Chest Port 1 View  03/26/2015   CLINICAL DATA:  Shortness of breath with productive cough. Chest tightness for 1 week. History of COPD.  EXAM: PORTABLE CHEST - 1 VIEW  COMPARISON:  None.  FINDINGS: The heart is enlarged. Mild vascular congestion. There is slight hyperinflation. No active infiltrates or failure. Similar appearance to priors.  IMPRESSION: COPD.  Cardiomegaly.  Mild vascular congestion.   Electronically Signed  By: Rolla Flatten M.D.   On: 03/26/2015 20:44    Lab Results: Basic Metabolic Panel: No results for input(s): NA, K, CL, CO2, GLUCOSE, BUN, CREATININE, CALCIUM, MG, PHOS in the last 72 hours. Liver Function Tests: No results for input(s): AST, ALT, ALKPHOS, BILITOT, PROT, ALBUMIN in the last 72 hours.   CBC: No results for input(s): WBC, NEUTROABS, HGB, HCT, MCV, PLT in the last 72 hours.  Recent Results (from the past 240 hour(s))  MRSA PCR Screening     Status: Abnormal   Collection Time: 03/27/15  3:25 AM  Result Value Ref Range Status   MRSA by PCR RESULT CALLED TO, READ BACK BY AND VERIFIED WITH: (A)  NEGATIVE Final    CommentRadene Gunning RN ON [id number] AT 0855 BY RESSEGGER R        The GeneXpert MRSA Assay (FDA approved for NASAL specimens only), is one component of a comprehensive MRSA colonization surveillance program. It is not intended to diagnose MRSA infection nor to guide or monitor treatment for MRSA infections.      Hospital Course:   This a 69 years old patient who admitted due to acute excerbation of COPD. He was treated with IV antibiotics., IV steroid and nebulizer treatment. Patient improved and discharged in stable condition.  Discharge Exam: Blood pressure 162/90, pulse 82, temperature 97.8 F (36.6 C), temperature source Oral, resp. rate 20, height 6\' 3"  (1.905 m), weight 96.026 kg (211 lb 11.2 oz), SpO2 92 %.    Disposition:  home        Follow-up Information    Follow up with [facility], MD In 2 weeks.   Specialty:  Internal Medicine   Contact information:   [place] [address] [phone]       Signed: Rosita Fire   03/31/2015, 8:21 AM

## 2015-03-31 NOTE — Progress Notes (Signed)
Discharge instruction reviewed with patient. No distress noted. Saline lock removed. Prescription given to patient.

## 2015-03-31 NOTE — Care Management Note (Signed)
Case Management Note  Patient Details  Name: Michael Ellison MRN: [id number] Date of Birth: Jul 09, 1946  Subjective/Objective:                    Action/Plan:   Expected Discharge Date:                  Expected Discharge Plan:  Home/Self Care  In-House Referral:  NA  Discharge planning Services  CM Consult  Post Acute Care Choice:  NA Choice offered to:  NA  DME Arranged:    DME Agency:     HH Arranged:    [place] Agency:     Status of Service:  Completed, signed off  Medicare Important Message Given:  Yes Date Medicare IM Given:  03/28/15 Medicare IM give by:  Jolene Provost, RN, MSN, CM  Date Additional Medicare IM Given:  03/31/15 Additional Medicare Important Message give by:  Christinia Gully, RN BSN CM  If discussed at [employer], dates discussed:    Additional Comments: Pt discharged home today. Pt has neb machine at home. No CM needs noted. Christinia Gully [place], RN 03/31/2015, 9:01 AM

## 2015-04-04 DIAGNOSIS — J441 Chronic obstructive pulmonary disease with (acute) exacerbation: Secondary | ICD-10-CM | POA: Diagnosis not present

## 2015-04-10 ENCOUNTER — Ambulatory Visit: Payer: Medicare Other | Admitting: Emergency Medicine

## 2015-04-22 DIAGNOSIS — I10 Essential (primary) hypertension: Secondary | ICD-10-CM | POA: Diagnosis not present

## 2015-04-22 DIAGNOSIS — J449 Chronic obstructive pulmonary disease, unspecified: Secondary | ICD-10-CM | POA: Diagnosis not present

## 2015-04-23 DIAGNOSIS — J449 Chronic obstructive pulmonary disease, unspecified: Secondary | ICD-10-CM | POA: Diagnosis not present

## 2015-05-16 ENCOUNTER — Ambulatory Visit: Payer: Medicare Other | Admitting: Emergency Medicine

## 2015-05-20 DIAGNOSIS — F321 Major depressive disorder, single episode, moderate: Secondary | ICD-10-CM | POA: Diagnosis not present

## 2015-05-20 DIAGNOSIS — I10 Essential (primary) hypertension: Secondary | ICD-10-CM | POA: Diagnosis not present

## 2015-05-20 DIAGNOSIS — J449 Chronic obstructive pulmonary disease, unspecified: Secondary | ICD-10-CM | POA: Diagnosis not present

## 2015-05-20 DIAGNOSIS — Z23 Encounter for immunization: Secondary | ICD-10-CM | POA: Diagnosis not present

## 2015-05-24 DIAGNOSIS — J449 Chronic obstructive pulmonary disease, unspecified: Secondary | ICD-10-CM | POA: Diagnosis not present

## 2015-06-24 DIAGNOSIS — J449 Chronic obstructive pulmonary disease, unspecified: Secondary | ICD-10-CM | POA: Diagnosis not present

## 2015-07-24 DIAGNOSIS — J449 Chronic obstructive pulmonary disease, unspecified: Secondary | ICD-10-CM | POA: Diagnosis not present

## 2015-08-24 DIAGNOSIS — J449 Chronic obstructive pulmonary disease, unspecified: Secondary | ICD-10-CM | POA: Diagnosis not present

## 2015-08-26 DIAGNOSIS — F329 Major depressive disorder, single episode, unspecified: Secondary | ICD-10-CM | POA: Diagnosis not present

## 2015-08-26 DIAGNOSIS — Z23 Encounter for immunization: Secondary | ICD-10-CM | POA: Diagnosis not present

## 2015-08-26 DIAGNOSIS — I10 Essential (primary) hypertension: Secondary | ICD-10-CM | POA: Diagnosis not present

## 2015-08-26 DIAGNOSIS — J449 Chronic obstructive pulmonary disease, unspecified: Secondary | ICD-10-CM | POA: Diagnosis not present

## 2015-09-23 DIAGNOSIS — J449 Chronic obstructive pulmonary disease, unspecified: Secondary | ICD-10-CM | POA: Diagnosis not present

## 2015-10-24 DIAGNOSIS — J449 Chronic obstructive pulmonary disease, unspecified: Secondary | ICD-10-CM | POA: Diagnosis not present

## 2015-11-14 ENCOUNTER — Encounter (HOSPITAL_COMMUNITY): Payer: Self-pay

## 2015-11-14 ENCOUNTER — Emergency Department (HOSPITAL_COMMUNITY)
Admission: EM | Admit: 2015-11-14 | Discharge: 2015-11-14 | Disposition: A | Discharge status: Home / Self Care | Payer: Medicare Other | Attending: Emergency Medicine | Admitting: Emergency Medicine

## 2015-11-14 ENCOUNTER — Emergency Department (HOSPITAL_COMMUNITY): Payer: Medicare Other

## 2015-11-14 DIAGNOSIS — J441 Chronic obstructive pulmonary disease with (acute) exacerbation: Secondary | ICD-10-CM | POA: Diagnosis not present

## 2015-11-14 DIAGNOSIS — Z7951 Long term (current) use of inhaled steroids: Secondary | ICD-10-CM | POA: Diagnosis not present

## 2015-11-14 DIAGNOSIS — Z87891 Personal history of nicotine dependence: Secondary | ICD-10-CM | POA: Insufficient documentation

## 2015-11-14 DIAGNOSIS — Z8701 Personal history of pneumonia (recurrent): Secondary | ICD-10-CM | POA: Diagnosis not present

## 2015-11-14 DIAGNOSIS — Z7982 Long term (current) use of aspirin: Secondary | ICD-10-CM | POA: Insufficient documentation

## 2015-11-14 DIAGNOSIS — Z9981 Dependence on supplemental oxygen: Secondary | ICD-10-CM | POA: Insufficient documentation

## 2015-11-14 DIAGNOSIS — Z8546 Personal history of malignant neoplasm of prostate: Secondary | ICD-10-CM | POA: Insufficient documentation

## 2015-11-14 DIAGNOSIS — F329 Major depressive disorder, single episode, unspecified: Secondary | ICD-10-CM | POA: Insufficient documentation

## 2015-11-14 DIAGNOSIS — R0602 Shortness of breath: Secondary | ICD-10-CM | POA: Diagnosis not present

## 2015-11-14 DIAGNOSIS — Z79899 Other long term (current) drug therapy: Secondary | ICD-10-CM | POA: Diagnosis not present

## 2015-11-14 DIAGNOSIS — I10 Essential (primary) hypertension: Secondary | ICD-10-CM | POA: Insufficient documentation

## 2015-11-14 HISTORY — DX: Dependence on supplemental oxygen: Z99.81

## 2015-11-14 LAB — BASIC METABOLIC PANEL
Anion gap: 7 (ref 5–15)
BUN: 16 mg/dL (ref 6–20)
CALCIUM: 9.3 mg/dL (ref 8.9–10.3)
CO2: 23 mmol/L (ref 22–32)
CREATININE: 0.95 mg/dL (ref 0.61–1.24)
Chloride: 109 mmol/L (ref 101–111)
GFR calc Af Amer: >60 mL/min (ref >60)
GLUCOSE: 94 mg/dL (ref 65–99)
Potassium: 4 mmol/L (ref 3.5–5.1)
Sodium: 139 mmol/L (ref 135–145)

## 2015-11-14 LAB — CBC WITH DIFFERENTIAL/PLATELET
BASOS PCT: 1 %
Basophils Absolute: 0.1 10*3/uL (ref 0.0–0.1)
EOS ABS: 1 10*3/uL — AB (ref 0.0–0.7)
EOS PCT: 11 %
HCT: 47 % (ref 39.0–52.0)
Hemoglobin: 15.9 g/dL (ref 13.0–17.0)
Lymphocytes Relative: 26 %
Lymphs Abs: 2.3 10*3/uL (ref 0.7–4.0)
MCH: 28 pg (ref 26.0–34.0)
MCHC: 33.8 g/dL (ref 30.0–36.0)
MCV: 82.9 fL (ref 78.0–100.0)
MONOS PCT: 7 %
Monocytes Absolute: 0.6 10*3/uL (ref 0.1–1.0)
NEUTROS PCT: 55 %
Neutro Abs: 4.8 10*3/uL (ref 1.7–7.7)
PLATELETS: 257 10*3/uL (ref 150–400)
RBC: 5.67 MIL/uL (ref 4.22–5.81)
RDW: 14.7 % (ref 11.5–15.5)
WBC: 8.9 10*3/uL (ref 4.0–10.5)

## 2015-11-14 LAB — TROPONIN I: Troponin I: <0.03 ng/mL (ref <0.031)

## 2015-11-14 MED ORDER — METHYLPREDNISOLONE SODIUM SUCC 125 MG IJ SOLR
125.0000 mg | Freq: Once | INTRAMUSCULAR | Status: AC
  Administered 2015-11-14: 125 mg via INTRAVENOUS
  Filled 2015-11-14: qty 2

## 2015-11-14 MED ORDER — IPRATROPIUM BROMIDE 0.02 % IN SOLN
1.0000 mg | Freq: Once | RESPIRATORY_TRACT | Status: AC
  Administered 2015-11-14: 1 mg via RESPIRATORY_TRACT
  Filled 2015-11-14: qty 5

## 2015-11-14 MED ORDER — DOXYCYCLINE HYCLATE 100 MG PO TABS: 100.0000 mg | ORAL_TABLET | Freq: Two times a day (BID) | ORAL | Status: DC

## 2015-11-14 MED ORDER — PREDNISONE 20 MG PO TABS: 40.0000 mg | ORAL_TABLET | Freq: Every day | ORAL | Status: DC

## 2015-11-14 MED ORDER — ALBUTEROL SULFATE HFA 108 (90 BASE) MCG/ACT IN AERS: 2.0000 | INHALATION_SPRAY | RESPIRATORY_TRACT | Status: DC | PRN

## 2015-11-14 MED ORDER — ALBUTEROL (5 MG/ML) CONTINUOUS INHALATION SOLN
10.0000 mg/h | INHALATION_SOLUTION | Freq: Once | RESPIRATORY_TRACT | Status: AC
  Administered 2015-11-14: 10 mg/h via RESPIRATORY_TRACT
  Filled 2015-11-14: qty 20

## 2015-11-14 NOTE — Discharge Instructions (Signed)
°Emergency Department Resource Guide °1) Find a Doctor and Pay Out of Pocket °Although you won't have to find out who is covered by your insurance plan, it is a good idea to ask around and get recommendations. You will then need to call the office and see if the doctor you have chosen will accept you as a new patient and what types of options they offer for patients who are self-pay. Some doctors offer discounts or will set up payment plans for their patients who do not have insurance, but you will need to ask so you aren't surprised when you get to your appointment. ° °2) Contact Your Local Health Department °Not all health departments have doctors that can see patients for sick visits, but many do, so it is worth a call to see if yours does. If you don't know where your local health department is, you can check in your phone book. The CDC also has a tool to help you locate your state's health department, and many state websites also have listings of all of their local health departments. ° °3) Find a Walk-in Clinic °If your illness is not likely to be very severe or complicated, you may want to try a walk in clinic. These are popping up all over the country in pharmacies, drugstores, and shopping centers. They're usually staffed by nurse practitioners or physician assistants that have been trained to treat common illnesses and complaints. They're usually fairly quick and inexpensive. However, if you have serious medical issues or chronic medical problems, these are probably not your best option. ° °No Primary Care Doctor: °- Call [facility] at  832-8000 - they can help you locate a primary care doctor that  accepts your insurance, provides certain services, etc. °- Physician Referral Service- 1-800-533-3463 ° °Chronic Pain Problems: °Organization         Address  Phone   Notes  °[facility]  (336) 297-2271 Patients need to be referred by their primary care doctor.  ° °Medication  Assistance: °Organization         Address  Phone   Notes  °[place] [facility] [address] °[place], [place] [address] (336) 641-8030 --Must be a resident of [place] °-- Must have NO insurance coverage whatsoever (no Medicaid/ Medicare, etc.) °-- The pt. MUST have a primary care doctor that directs their care regularly and follows them in the community °  °[facility]  (866) 331-1348   °[employer]  (888) 892-1162   ° °Agencies that provide inexpensive medical care: °Organization         Address  Phone   Notes  °[facility]  (336) 832-8035   ° Internal Medicine    (336) 832-7272   °[facility] [address] °[place], [place] [address] (336) 832-4777   °[facility] [address], °[place] (336) 271-4999   °[facility]    (336) 373-0678   °[facility]    (336) 272-1050   °[facility] ° [address], [place] Phone:  (336) 832-4444, Fax:  (336) 832-4440 Hours of Operation:  9 am - 6 pm, M-F.  Also accepts Medicaid/Medicare and self-pay.  °[facility] ° [address],  Phone: (336) 832-3150, Fax: (336) 832-3151. Hours of Operation:  8:30 am - 5:30 pm, M-F.  Also accepts Medicaid and self-pay.  °[facility] [place] [address], [place] Phone: (336) 878-6027   °[facility] [address], [place], [place] (336)723-1848, Ext. 123 Mondays & Thursdays: 7-9 AM.  First 15 patients are seen on a first come, first serve basis. °  ° °Medicaid-accepting [place] Providers: ° °Organization         Address  Phone   Notes  °[facility] [address], [place] (336) 641-2100 Also accepts self-pay patients.  °[facility] [address], [place] ° (336) 856-9996   °[facility] [address], [place]  (336) 288-8857   °[facility] [address] [place] [address], [place] (336) 299-7000   °Veita Bland [address], [place]  ° (336) 373-1557 Only accepts [place] Access Medicaid patients after they have their name applied to their card.  ° °Self-Pay (no insurance) in [place]: ° °Organization         Address  Phone   Notes  °Sickle Cell Patients, [facility] [address], [place] (336) 832-1970   °[facility] [address], [place] (336) 832-4400   °[facility] [place] ° [address], [place] (336) 992-4800   °[facility]/Dr. Osei-Bonsu ° [address] [place] [address], [place] or [address], [place] (336) 841-8500 Phone number for both [place] and [place] locations is the same.  °[facility] [address], [place] (336) 299-0000   °[facility] [address] [place] [address], [place] or [address] (336) 852-7530 °(336) 878-2260   °[facility] [address], [place] (336) 350-1642, phone; (336) 294-5005, fax Sees patients 1st and 3rd Saturday of every month.  Must not qualify for public or private insurance (i.e. Medicaid, Medicare, [place] Health Choice, Veterans' Benefits) • Household income should be no more than 200% of the poverty level •The clinic cannot treat you if you are pregnant or think you are pregnant • Sexually transmitted diseases are not treated at the clinic.  ° ° °Dental Care: °Organization         Address  Phone  Notes  °[place] [facility] [address],  (336) 641-6152 Accepts children up to age 21 who are enrolled in Medicaid or [place] Health Choice; pregnant women with a Medicaid card; and children who have applied for Medicaid or [place] Health Choice, but were declined, whose parents can pay a reduced fee at time of service.  °[place]  [facility] [place]  [address], [place] (336) 641-7733 Accepts children up to age 21 who are enrolled in Medicaid or [place] Health Choice; pregnant women with a Medicaid card; and children who have applied for Medicaid or [place] Health Choice, but were declined, whose parents can pay a reduced fee at time of service.  °[facility] ° [address],  (336) 641-4533 Patients are seen by appointment only. Walk-ins are not accepted. [facility] will see patients 18 years of age and older. °Monday - Tuesday (8am-5pm) °Most Wednesdays (8:30-5pm) °$30 per visit, cash only  °[facility] ° [address], [place] (336) 641-4533 Patients are seen by appointment only. Walk-ins are not accepted. [facility] will see patients 18 years of age and older. °One   Wednesday Evening (Monthly: Volunteer Based).  $30 per visit, cash only  °[facility]  (919) 537-3737 for adults; Children under age 4, call Graduate Pediatric Dentistry at (919) [phone]. Children aged 4-14, please call (919) 537-3737 to request a pediatric application. ° Dental services are provided in all areas of dental care including fillings, crowns and bridges, complete and partial dentures, implants, gum treatment, root canals, and extractions. Preventive care is also provided. Treatment is provided to both adults and children. °Patients are selected via a lottery and there is often a waiting list. °  °[facility] [address], °[place] ° (336) 763-8833 [email or web address] °  °[facility] [address], [place], [place] (336)723-1848, Ext. 123 Second and Fourth Thursday of each month, opens at 6:30 AM; Clinic ends at 9 AM.  Patients are seen on a first-come first-served basis, and a limited number are seen during each clinic.  ° °[facility] ° [address], [place], [place] (336) 723-7904    Eligibility Requirements °You must have lived in [place], [place], or [place] counties for at least the last three months. °  You cannot be eligible for state or federal sponsored healthcare insurance, including Veterans Administration, Medicaid, or Medicare. °  You generally cannot be eligible for healthcare insurance through your employer.  °  How to apply: °Eligibility screenings are held every Tuesday and Wednesday afternoon from 1:00 pm until 4:00 pm. You do not need an appointment for the interview!  °[facility] [address], [place], [place] 336-631-2330   °[facility] [facility]  336-342-8273   °[facility]  336-703-3100   °[facility]  336-570-6415   ° °Behavioral Health Resources in the Community: °Intensive Outpatient Programs °Organization         Address  Phone  Notes  °[facility] [address], [place], [place] 336-878-6098   °[facility] [address], [place], [place] 336-832-9800   °[facility] [address], [place], [place] ° 336-882-2125   °[facility] [address],  °[place], [place] 1-800-853-5163 or 336-641-4981   °Substance Abuse Resources °Organization         Address  Phone  Notes  °[facility]  336-882-2125   °[facility]  336-784-9470   °[facility]  336-285-9073   °[facility]  336-845-3988   °Residential & Outpatient Substance Abuse Program  1-800-659-3381   °Psychological Services °Organization         Address  Phone  Notes  °[facility]  336- 832-9600   °[facility]  336- 378-7881   °[facility] [address], [place] 1-800-853-5163 or 336-641-4981   ° °Mobile Crisis Teams °Organization         Address  Phone  Notes  °[facility], Mobile Crisis Care Unit  1-877-626-1772   °Assertive °[facility] ° [address]  [place], [place] 336-834-9664   °Sharon DeEsch [address] °[place] 336-554-5454   ° °Self-Help/Support Groups °Organization         Address  Phone             Notes  °[facility] [place] - variety of support groups  336- 373-1402 Call for more information  °Narcotics Anonymous (NA), [facility] [address], °[place]   2 meetings at this location  ° °  Residential Treatment Programs °Organization         Address  Phone  Notes  °[facility] [address],    °[place]  1-866-801-8205   °[facility] ° [address], [place], [place] 704-293-8524   °[facility] [address], [place] 336-845-3988 Admissions: 8am-3pm M-F  °[facility] [address]    °[place], [place] 336-841-1104   °[facility] [address], [place], [place] 336-379-7146   °[facility] [address]  °[place], [place] 336-285-9073   °[facility] - Intensive Outpatient [address], [place], [place] 336-852-3033   °[facility] [address]  °[place], [place] 1-877-615-2722 or 336-784-9470   °[facility] [address] [place], [place] 336-227-7417 Accepts Medicaid  °[facility] [address]  °[place] 1-800-659-3381 Substance Abuse/Addiction Treatment  ° °[place] Behavioral Health Resources °Organization         Address  Phone  Notes  °[facility]  (888) 581-9988   °Julie Brannon, PhD [address] [place], [place]   (336) 349-5553 or (336) 951-0000   °[facility]   [address] °[place], [place] (336) 349-4454   °[facility] [address], [place], [place] (336) 342-8316 Insurance/Medicaid/sponsorship through [facility]  °[facility] [address]                                    [place], [place] (336) 342-8316 Therapy/tele-psych/case    °[facility] [address]  ° [place], [place] (336) 349-2233    °Dr. Arfeen  (336) 349-4544   °[facility] [place]  [employer] [place] [facility] 1) [address],  °2) [address], [place] °3)  [address], [place] (336) 349-3220 °(336) 342-7768 ° °(336) 342-8140   °[place] Child Abuse [facility]line (336) 342-1394 or (336) 342-3537 (After Hours)    ° ° ° °Take the prescriptions as directed.  Use your albuterol inhaler (2 to 4 puffs) or your albuterol nebulizer (1 unit dose) every 4 hours for the next 7 days, then as needed for cough, wheezing, or shortness of breath.  Call your regular medical doctor Monday morning to schedule a follow up appointment within the next 3 days.  Return to the Emergency Department immediately sooner if worsening.  ° °

## 2015-11-14 NOTE — ED Notes (Signed)
RT at bedside.

## 2015-11-14 NOTE — ED Provider Notes (Signed)
CSN: [id number]     Arrival date & time 11/14/15  1315 History   First MD Initiated Contact with Patient 11/14/15 1332     Chief Complaint  Patient presents with  . Shortness of Breath      HPI  Pt was seen at 1335.  Per pt, c/o gradual onset and worsening of persistent cough, wheezing and SOB for the past 3 days, worse since yesterday.  Describes his symptoms as "my COPD is acting up."  Has been using home O2, MDI and nebs with transient relief.  Denies CP/palpitations, no back pain, no abd pain, no N/V/D, no fevers, no rash.    Past Medical History  Diagnosis Date  . Hypertension   . Cancer ([place])   . Cancer of prostate ([place])   . Depression   . Asthma   . Shortness of breath   . COPD (chronic obstructive pulmonary disease) ([place])     home O2  . Aspiration pneumonia ([place]) 03/01/2012  . Hernia of abdominal cavity   . On home O2     3L prn   Past Surgical History  Procedure Laterality Date  . Hernia repair    . Prostate biopsy    . Robotic prostate surgery  2011  . Laparotomy  02/25/2012    Procedure: EXPLORATORY LAPAROTOMY;  Surgeon: Donato Heinz, MD;  Location: [facility];  Service: General;  Laterality: N/A;  . Bowel resection  02/25/2012    Procedure: SMALL BOWEL RESECTION;  Surgeon: Donato Heinz, MD;  Location: [facility];  Service: General;;  . Wound debridement  03/02/2012    Procedure: DEBRIDEMENT CLOSURE/ABDOMINAL WOUND;  Surgeon: Gwenyth Ober, MD;  Location: [facility];  Service: General;  Laterality: N/A;  . Tracheostomy  03/2012  . Ventral hernia repair  12/25/2012     Dr Hulen Skains  . Ventral hernia repair N/A 12/25/2012    Procedure: HERNIA REPAIR VENTRAL ADULT;  Surgeon: Gwenyth Ober, MD;  Location: [facility];  Service: General;  Laterality: N/A;  . Insertion of mesh N/A 12/25/2012    Procedure: INSERTION OF MESH;  Surgeon: Gwenyth Ober, MD;  Location: [facility];  Service: General;  Laterality: N/A;  . Incision and drainage abscess N/A 01/15/2013    Procedure: INCISION AND DRAINAGE  Abdominal Wall ABSCESS;  Surgeon: Gwenyth Ober, MD;  Location: [facility];  Service: General;  Laterality: N/A;   Family History  Problem Relation Age of Onset  . Pseudochol deficiency Neg Hx   . Anesthesia problems Neg Hx   . Hypotension Neg Hx   . Malignant hyperthermia Neg Hx    Social History  Substance Use Topics  . Smoking status: Former Smoker -- 1.50 packs/day for 44 years    Types: Cigarettes    Quit date: 02/22/2012  . Smokeless tobacco: Never Used  . Alcohol Use: No     Comment: occasional beer 1-2    Review of Systems ROS: Statement: All systems negative except as marked or noted in the HPI; Constitutional: Negative for fever and chills. ; ; Eyes: Negative for eye pain, redness and discharge. ; ; ENMT: Negative for ear pain, hoarseness, nasal congestion, sinus pressure and sore throat. ; ; Cardiovascular: Negative for chest pain, palpitations, diaphoresis, and peripheral edema. ; ; Respiratory: +SOB, wheezing, cough. Negative for stridor. ; ; Gastrointestinal: Negative for nausea, vomiting, diarrhea, abdominal pain, blood in stool, hematemesis, jaundice and rectal bleeding. . ; ; Genitourinary: Negative for dysuria, flank pain and hematuria. ; ;  Musculoskeletal: Negative for back pain and neck pain. Negative for swelling and trauma.; ; Skin: Negative for pruritus, rash, abrasions, blisters, bruising and skin lesion.; ; Neuro: Negative for headache, lightheadedness and neck stiffness. Negative for weakness, altered level of consciousness , altered mental status, extremity weakness, paresthesias, involuntary movement, seizure and syncope.        Allergies  Lorazepam  Home Medications   Prior to Admission medications   Medication Sig Start Date End Date Taking? Authorizing Provider  amLODipine (NORVASC) 5 MG tablet Take 5 mg by mouth daily. 11/14/15  Yes Historical Provider, MD  aspirin EC 81 MG tablet Take 81 mg by mouth once a week.   Yes Historical Provider, MD   budesonide-formoterol (SYMBICORT) 160-4.5 MCG/ACT inhaler Inhale 2 puffs into the lungs 2 (two) times daily. 04/20/14  Yes Thurnell Lose, MD  escitalopram (LEXAPRO) 10 MG tablet Take 10 mg by mouth daily.   Yes Historical Provider, MD  ipratropium-albuterol (DUONEB) 0.5-2.5 (3) MG/3ML SOLN Take 3 mLs by nebulization every 6 (six) hours as needed (for shortness of breath/wheezing).    Yes Historical Provider, MD  lisinopril (PRINIVIL,ZESTRIL) 20 MG tablet Take 20 mg by mouth 2 (two) times daily.    Yes Historical Provider, MD  traMADol-acetaminophen (ULTRACET) 37.5-325 MG per tablet Take 1 tablet by mouth 2 (two) times daily as needed for moderate pain or severe pain.    Yes Historical Provider, MD   BP 141/96 mmHg  Pulse 95  Temp(Src) 98 F (36.7 C) (Oral)  Resp 20  Ht 6\' 3"  (1.905 m)  Wt 215 lb (97.523 kg)  BMI 26.87 kg/m2  SpO2 98% Physical Exam  1340: Physical examination:  Nursing notes reviewed; Vital signs and O2 SAT reviewed;  Constitutional: Well developed, Well nourished, Well hydrated, In no acute distress; Head:  Normocephalic, atraumatic; Eyes: EOMI, PERRL, No scleral icterus; ENMT: Mouth and pharynx normal, Mucous membranes moist; Neck: Supple, Full range of motion, No lymphadenopathy; Cardiovascular: Regular rate and rhythm, No gallop; Respiratory: Breath sounds coarse & equal bilaterally, insp/exp wheezes bilat. No audible wheezing.  Speaking full sentences, Normal respiratory effort/excursion; Chest: Nontender, Movement normal; Abdomen: Soft, Nontender, Nondistended, Normal bowel sounds; Genitourinary: No CVA tenderness; Extremities: Pulses normal, No tenderness, No edema, No calf edema or asymmetry.; Neuro: AA&Ox3, Major CN grossly intact.  Speech clear. No gross focal motor or sensory deficits in extremities.; Skin: Color normal, Warm, Dry.   ED Course  Procedures (including critical care time) Labs Review   Imaging Review  I have personally reviewed and evaluated  these images and lab results as part of my medical decision-making.   EKG Interpretation   Date/Time:  Friday November 14 2015 13:29:59 EST Ventricular Rate:  86 PR Interval:  204 QRS Duration: 90 QT Interval:  353 QTC Calculation: 422 R Axis:   69 Text Interpretation:  Sinus rhythm Multiple ventricular premature  complexes Baseline wander Artifact When compared with ECG of 03/26/2015  Premature ventricular complexes are now Present Confirmed by [facility]  MD,  Nunzio Cory [phone]) on 11/14/2015 1:49:53 PM      MDM  MDM Reviewed: previous chart, nursing note and vitals Reviewed previous: labs and ECG Interpretation: labs, ECG and x-ray     Results for orders placed or performed during the hospital encounter of [id number]  Basic metabolic panel  Result Value Ref Range   Sodium 139 135 - 145 mmol/L   Potassium 4.0 3.5 - 5.1 mmol/L   Chloride 109 101 - 111 mmol/L   CO2  23 22 - 32 mmol/L   Glucose, Bld 94 65 - 99 mg/dL   BUN 16 6 - 20 mg/dL   Creatinine, Ser 0.95 0.61 - 1.24 mg/dL   Calcium 9.3 8.9 - 10.3 mg/dL   GFR calc non Af Amer >60 >60 mL/min   GFR calc Af Amer >60 >60 mL/min   Anion gap 7 5 - 15  Troponin I  Result Value Ref Range   Troponin I <0.03 <0.031 ng/mL  CBC with Differential  Result Value Ref Range   WBC 8.9 4.0 - 10.5 K/uL   RBC 5.67 4.22 - 5.81 MIL/uL   Hemoglobin 15.9 13.0 - 17.0 g/dL   HCT 47.0 39.0 - 52.0 %   MCV 82.9 78.0 - 100.0 fL   MCH 28.0 26.0 - 34.0 pg   MCHC 33.8 30.0 - 36.0 g/dL   RDW 14.7 11.5 - 15.5 %   Platelets 257 150 - 400 K/uL   Neutrophils Relative % 55 %   Neutro Abs 4.8 1.7 - 7.7 K/uL   Lymphocytes Relative 26 %   Lymphs Abs 2.3 0.7 - 4.0 K/uL   Monocytes Relative 7 %   Monocytes Absolute 0.6 0.1 - 1.0 K/uL   Eosinophils Relative 11 %   Eosinophils Absolute 1.0 (H) 0.0 - 0.7 K/uL   Basophils Relative 1 %   Basophils Absolute 0.1 0.0 - 0.1 K/uL   Dg Chest Port 1 View 11/14/2015  CLINICAL DATA:  Worsening shortness of breath  over the past 2 days. Chest pain. EXAM: PORTABLE CHEST 1 VIEW COMPARISON:  Single view of the chest 03/26/2015. PA and lateral chest 03/17/2015. FINDINGS: Bullous emphysematous disease is identified. No consolidative process, pneumothorax or effusion. Heart size is upper normal. No pulmonary edema. IMPRESSION: Emphysema without acute disease. Electronically Signed   By: Inge Rise M.D.   On: 11/14/2015 14:27    1630:  Pt states he "feels better" after neb and steroid.  NAD, lungs CTA bilat, no wheezing, resps easy, speaking full sentences, Sats 99% on pt's usual O2 3L N/C.  Pt ambulated around the ED with Sats remaining 96-98 % on his usual O2 3L N/C, resps easy, NAD.  Pt states he wants to go home now. Will continue to tx symptomatically at this time. Dx and testing d/w pt and family.  Questions answered.  Verb understanding, agreeable to d/c home with outpt f/u.    Francine Graven, DO 11/17/15 2118

## 2015-11-14 NOTE — ED Notes (Signed)
RT called

## 2015-11-14 NOTE — ED Notes (Signed)
Pt states he copd . States he has been more SOB over the past few days

## 2015-11-24 DIAGNOSIS — J449 Chronic obstructive pulmonary disease, unspecified: Secondary | ICD-10-CM | POA: Diagnosis not present

## 2015-12-03 DIAGNOSIS — J441 Chronic obstructive pulmonary disease with (acute) exacerbation: Secondary | ICD-10-CM | POA: Diagnosis not present

## 2015-12-22 DIAGNOSIS — J449 Chronic obstructive pulmonary disease, unspecified: Secondary | ICD-10-CM | POA: Diagnosis not present

## 2016-01-22 DIAGNOSIS — J449 Chronic obstructive pulmonary disease, unspecified: Secondary | ICD-10-CM | POA: Diagnosis not present

## 2016-02-21 DIAGNOSIS — J449 Chronic obstructive pulmonary disease, unspecified: Secondary | ICD-10-CM | POA: Diagnosis not present

## 2016-03-08 DIAGNOSIS — C61 Malignant neoplasm of prostate: Secondary | ICD-10-CM | POA: Diagnosis not present

## 2016-03-08 DIAGNOSIS — H9319 Tinnitus, unspecified ear: Secondary | ICD-10-CM | POA: Diagnosis not present

## 2016-03-08 DIAGNOSIS — K432 Incisional hernia without obstruction or gangrene: Secondary | ICD-10-CM | POA: Diagnosis not present

## 2016-03-08 DIAGNOSIS — Z0000 Encounter for general adult medical examination without abnormal findings: Secondary | ICD-10-CM | POA: Diagnosis not present

## 2016-03-08 DIAGNOSIS — I10 Essential (primary) hypertension: Secondary | ICD-10-CM | POA: Diagnosis not present

## 2016-03-08 DIAGNOSIS — J449 Chronic obstructive pulmonary disease, unspecified: Secondary | ICD-10-CM | POA: Diagnosis not present

## 2016-03-23 ENCOUNTER — Telehealth: Payer: Self-pay

## 2016-03-23 DIAGNOSIS — J449 Chronic obstructive pulmonary disease, unspecified: Secondary | ICD-10-CM | POA: Diagnosis not present

## 2016-03-23 NOTE — Telephone Encounter (Signed)
Pt was calling for DS. He received a triage letter. Please call either [phone] or [phone]

## 2016-03-31 NOTE — Telephone Encounter (Signed)
I called pt and he is having problems with constipation. Ov with Neil Crouch, PA on 05/05/2016 at 1:30 PM. He has [place].  He knows to arrive 15 min early and bring list of his meds.

## 2016-04-22 DIAGNOSIS — J449 Chronic obstructive pulmonary disease, unspecified: Secondary | ICD-10-CM | POA: Diagnosis not present

## 2016-05-05 ENCOUNTER — Other Ambulatory Visit: Payer: Self-pay

## 2016-05-05 ENCOUNTER — Encounter: Payer: Self-pay | Admitting: Gastroenterology

## 2016-05-05 ENCOUNTER — Ambulatory Visit (INDEPENDENT_AMBULATORY_CARE_PROVIDER_SITE_OTHER): Payer: Medicare Other | Admitting: Gastroenterology

## 2016-05-05 VITALS — BP 168/80 | HR 92 | Temp 97.5°F | Ht 75.0 in | Wt 198.4 lb

## 2016-05-05 DIAGNOSIS — K5900 Constipation, unspecified: Secondary | ICD-10-CM | POA: Diagnosis not present

## 2016-05-05 DIAGNOSIS — Z1211 Encounter for screening for malignant neoplasm of colon: Secondary | ICD-10-CM

## 2016-05-05 MED ORDER — PEG 3350-KCL-NA BICARB-NACL 420 G PO SOLR: 4000.0000 mL | ORAL | Status: DC

## 2016-05-05 NOTE — Assessment & Plan Note (Signed)
70 year old gentleman who presents for first ever screening colonoscopy. He has some mild baseline constipation. Patient with history of COPD with intermittent oxygen use, large abdominal wall hernia. Utilize MiraLAX 17 g at bedtime on days he does not have an adequate bowel movement. Plan on a colonoscopy in the near future. Discussed possibility of incomplete colonoscopy related to abdominal wall hernia and for possibility of perforation.  I have discussed the risks, alternatives, benefits with regards to but not limited to the risk of reaction to medication, bleeding, infection, perforation and the patient is agreeable to proceed. Written consent to be obtained.

## 2016-05-05 NOTE — Progress Notes (Signed)
Primary Care Physician:  Rosita Fire, MD  Primary Gastroenterologist:  Garfield Cornea, MD   Chief Complaint  Patient presents with  . Colonoscopy    having constipation    HPI:  Michael Ellison is a 70 y.o. male here For consideration of colonoscopy. No prior colonoscopy. He has intermittent constipation, takes MiraLAX or milk of magnesia when necessary. Denies abdominal pain, prior blood per rectum, melena. His appetite is good. No heartburn. No dysphagia. No vomiting. Denies any unintentional weight loss. No family history of colon cancer. He does have large abdominal wall hernia, plans to follow-up with his surgeon.  Patient has a complicated past medical history. In 2013 he required exploratory laparotomy with small bowel resection for small bowel obstruction. Complicated by postop ileus and fascial dehiscence requiring return to the OR. He had acute respiratory failure with prolonged intubation and eventual tracheostomy. History of COPD with exacerbation, last hospitalization every year ago. Required Zenker's diverticulectomy in 2013 as well. 2014 he had ventral hernia repair and subsequent I&D of abdominal wall abscess couple weeks later.   Current Outpatient Prescriptions  Medication Sig Dispense Refill  . albuterol (PROVENTIL HFA;VENTOLIN HFA) 108 (90 Base) MCG/ACT inhaler Inhale 2 puffs into the lungs every 4 (four) hours as needed for wheezing or shortness of breath. 1 Inhaler 0  . amLODipine (NORVASC) 5 MG tablet Take 5 mg by mouth daily.    Marland Kitchen aspirin EC 81 MG tablet Take 81 mg by mouth once a week.    . budesonide-formoterol (SYMBICORT) 160-4.5 MCG/ACT inhaler Inhale 2 puffs into the lungs 2 (two) times daily. 1 Inhaler 12  . escitalopram (LEXAPRO) 10 MG tablet Take 10 mg by mouth daily.    Marland Kitchen ipratropium-albuterol (DUONEB) 0.5-2.5 (3) MG/3ML SOLN Take 3 mLs by nebulization every 6 (six) hours as needed (for shortness of breath/wheezing).     Marland Kitchen lisinopril (PRINIVIL,ZESTRIL) 20 MG  tablet Take 20 mg by mouth 2 (two) times daily.     . traMADol-acetaminophen (ULTRACET) 37.5-325 MG per tablet Take 1 tablet by mouth 2 (two) times daily as needed for moderate pain or severe pain.      No current facility-administered medications for this visit.    Allergies as of 05/05/2016 - Review Complete 05/05/2016  Allergen Reaction Noted  . Lorazepam Other (See Comments) 02/27/2012    Past Medical History  Diagnosis Date  . Hypertension   . Cancer ([place])   . Cancer of prostate ([place])   . Depression   . Asthma   . Shortness of breath   . COPD (chronic obstructive pulmonary disease) ([place])     home O2  . Aspiration pneumonia ([place]) 03/01/2012  . Hernia of abdominal cavity   . On home O2     3L prn    Past Surgical History  Procedure Laterality Date  . Hernia repair    . Prostate biopsy    . Robotic prostate surgery  2011  . Laparotomy  02/25/2012    Procedure: EXPLORATORY LAPAROTOMY;  Surgeon: Donato Heinz, MD;  Location: [facility];  Service: General;  Laterality: N/A;  . Bowel resection  02/25/2012    Procedure: SMALL BOWEL RESECTION;  Surgeon: Donato Heinz, MD;  Location: [facility];  Service: General;;  . Wound debridement  03/02/2012    Procedure: DEBRIDEMENT CLOSURE/ABDOMINAL WOUND;  Surgeon: Gwenyth Ober, MD;  Location: [facility];  Service: General;  Laterality: N/A;  . Tracheostomy  03/2012  . Ventral hernia repair  12/25/2012  Dr Hulen Skains  . Ventral hernia repair N/A 12/25/2012    Procedure: HERNIA REPAIR VENTRAL ADULT;  Surgeon: Gwenyth Ober, MD;  Location: [facility];  Service: General;  Laterality: N/A;  . Insertion of mesh N/A 12/25/2012    Procedure: INSERTION OF MESH;  Surgeon: Gwenyth Ober, MD;  Location: [facility];  Service: General;  Laterality: N/A;  . Incision and drainage abscess N/A 01/15/2013    Procedure: INCISION AND DRAINAGE Abdominal Wall ABSCESS;  Surgeon: Gwenyth Ober, MD;  Location: [facility];  Service: General;  Laterality: N/A;  . Zenker's diverticulectomy   2013    Dr. Redmond Baseman    Family History  Problem Relation Age of Onset  . Pseudochol deficiency Neg Hx   . Anesthesia problems Neg Hx   . Hypotension Neg Hx   . Malignant hyperthermia Neg Hx   . Colon cancer Neg Hx     Social History   Social History  . Marital Status: Divorced    Spouse Name: N/A  . Number of Children: N/A  . Years of Education: N/A   Occupational History  . retired     Corporate treasurer   Social History Main Topics  . Smoking status: Former Smoker -- 1.50 packs/day for 44 years    Types: Cigarettes    Quit date: 02/22/2012  . Smokeless tobacco: Never Used     Comment: Quit x 4 years  . Alcohol Use: No     Comment: occasional beer 1-2  . Drug Use: No  . Sexual Activity: No   Other Topics Concern  . Not on file   Social History Narrative      ROS:  General: Negative for anorexia, weight loss, fever, chills, fatigue, weakness. Eyes: Negative for vision changes.  ENT: Negative for hoarseness, difficulty swallowing , nasal congestion. CV: Negative for chest pain, angina, palpitations, dyspnea on exertion, peripheral edema.  Respiratory: Negative for dyspnea at rest,  cough, sputum, +wheezing. +intermittent DOE GI: See history of present illness. GU:  Negative for dysuria, hematuria, urinary incontinence, urinary frequency, nocturnal urination.  MS: Negative for joint pain, low back pain.  Derm: Negative for rash or itching.  Neuro: Negative for weakness, abnormal sensation, seizure, frequent headaches, memory loss, confusion.  Psych: Negative for anxiety, depression, suicidal ideation, hallucinations.  Endo: Negative for unusual weight change.  Heme: Negative for bruising or bleeding. Allergy: Negative for rash or hives.    Physical Examination:  BP 168/80 mmHg  Pulse 92  Temp(Src) 97.5 F (36.4 C) (Oral)  Ht 6\' 3"  (1.905 m)  Wt 198 lb 6.4 oz (89.994 kg)  BMI 24.80 kg/m2   General: Well-nourished, well-developed in no acute distress.  Head:  Normocephalic, atraumatic.   Eyes: Conjunctiva pink, no icterus. Mouth: Oropharyngeal mucosa moist and pink , no lesions erythema or exudate. Neck: Supple without thyromegaly, masses, or lymphadenopathy.  Lungs: Clear to auscultation bilaterally.  Heart: Regular rate and rhythm, no murmurs rubs or gallops.  Abdomen: Bowel sounds are normal, nontender, nondistended, no hepatosplenomegaly or masses, no abdominal bruits. Large hernia lower abd, soft, easily reducible and nontender.   Rectal: not performed Extremities: No lower extremity edema. No clubbing or deformities.  Neuro: Alert and oriented x 4 , grossly normal neurologically.  Skin: Warm and dry, no rash or jaundice.   Psych: Alert and cooperative, normal mood and affect.

## 2016-05-05 NOTE — Patient Instructions (Signed)
1. Take one capful of Miralax at bedtime on the days you do not have a good bowel movement.  2. Colonoscopy as scheduled. See separate instructions.

## 2016-05-05 NOTE — Progress Notes (Signed)
CC'ED TO PCP 

## 2016-05-23 DIAGNOSIS — J449 Chronic obstructive pulmonary disease, unspecified: Secondary | ICD-10-CM | POA: Diagnosis not present

## 2016-05-27 ENCOUNTER — Encounter (HOSPITAL_COMMUNITY): Payer: Self-pay

## 2016-05-27 ENCOUNTER — Encounter (HOSPITAL_COMMUNITY)
Admission: RE | Disposition: A | Discharge status: Home / Self Care | Payer: Self-pay | Source: Ambulatory Visit | Attending: Internal Medicine

## 2016-05-27 ENCOUNTER — Ambulatory Visit (HOSPITAL_COMMUNITY)
Admission: RE | Admit: 2016-05-27 | Discharge: 2016-05-27 | Disposition: A | Discharge status: Home / Self Care | Payer: Medicare Other | Source: Ambulatory Visit | Attending: Internal Medicine | Admitting: Internal Medicine

## 2016-05-27 DIAGNOSIS — Z7982 Long term (current) use of aspirin: Secondary | ICD-10-CM | POA: Insufficient documentation

## 2016-05-27 DIAGNOSIS — K439 Ventral hernia without obstruction or gangrene: Secondary | ICD-10-CM | POA: Diagnosis not present

## 2016-05-27 DIAGNOSIS — Z79899 Other long term (current) drug therapy: Secondary | ICD-10-CM | POA: Diagnosis not present

## 2016-05-27 DIAGNOSIS — Z9981 Dependence on supplemental oxygen: Secondary | ICD-10-CM | POA: Insufficient documentation

## 2016-05-27 DIAGNOSIS — I10 Essential (primary) hypertension: Secondary | ICD-10-CM | POA: Diagnosis not present

## 2016-05-27 DIAGNOSIS — K5730 Diverticulosis of large intestine without perforation or abscess without bleeding: Secondary | ICD-10-CM | POA: Diagnosis not present

## 2016-05-27 DIAGNOSIS — D124 Benign neoplasm of descending colon: Secondary | ICD-10-CM | POA: Diagnosis not present

## 2016-05-27 DIAGNOSIS — Z1211 Encounter for screening for malignant neoplasm of colon: Secondary | ICD-10-CM | POA: Diagnosis not present

## 2016-05-27 DIAGNOSIS — D125 Benign neoplasm of sigmoid colon: Secondary | ICD-10-CM | POA: Insufficient documentation

## 2016-05-27 DIAGNOSIS — Z7951 Long term (current) use of inhaled steroids: Secondary | ICD-10-CM | POA: Diagnosis not present

## 2016-05-27 DIAGNOSIS — J449 Chronic obstructive pulmonary disease, unspecified: Secondary | ICD-10-CM | POA: Diagnosis not present

## 2016-05-27 DIAGNOSIS — Z87891 Personal history of nicotine dependence: Secondary | ICD-10-CM | POA: Diagnosis not present

## 2016-05-27 DIAGNOSIS — F329 Major depressive disorder, single episode, unspecified: Secondary | ICD-10-CM | POA: Insufficient documentation

## 2016-05-27 DIAGNOSIS — Z930 Tracheostomy status: Secondary | ICD-10-CM | POA: Diagnosis not present

## 2016-05-27 HISTORY — PX: POLYPECTOMY: SHX5525

## 2016-05-27 HISTORY — PX: COLONOSCOPY: SHX5424

## 2016-05-27 SURGERY — COLONOSCOPY
Anesthesia: Moderate Sedation

## 2016-05-27 MED ORDER — MIDAZOLAM HCL 5 MG/5ML IJ SOLN
INTRAMUSCULAR | Status: DC | PRN
  Administered 2016-05-27 (×2): 2 mg via INTRAVENOUS
  Administered 2016-05-27: 1 mg via INTRAVENOUS

## 2016-05-27 MED ORDER — MIDAZOLAM HCL 5 MG/5ML IJ SOLN
INTRAMUSCULAR | Status: AC
  Filled 2016-05-27: qty 10

## 2016-05-27 MED ORDER — MEPERIDINE HCL 100 MG/ML IJ SOLN
INTRAMUSCULAR | Status: DC | PRN
  Administered 2016-05-27: 50 mg via INTRAVENOUS
  Administered 2016-05-27: 25 mg via INTRAVENOUS
  Administered 2016-05-27: 50 mg via INTRAVENOUS

## 2016-05-27 MED ORDER — ONDANSETRON HCL 4 MG/2ML IJ SOLN
INTRAMUSCULAR | Status: DC | PRN
  Administered 2016-05-27: 4 mg via INTRAVENOUS

## 2016-05-27 MED ORDER — STERILE WATER FOR IRRIGATION IR SOLN
Status: DC | PRN
  Administered 2016-05-27: 14:00:00

## 2016-05-27 MED ORDER — SODIUM CHLORIDE 0.9 % IV SOLN
INTRAVENOUS | Status: DC
  Administered 2016-05-27: 13:00:00 via INTRAVENOUS

## 2016-05-27 MED ORDER — ONDANSETRON HCL 4 MG/2ML IJ SOLN
INTRAMUSCULAR | Status: AC
  Filled 2016-05-27: qty 2

## 2016-05-27 MED ORDER — MEPERIDINE HCL 100 MG/ML IJ SOLN
INTRAMUSCULAR | Status: AC
  Filled 2016-05-27: qty 2

## 2016-05-27 NOTE — Op Note (Signed)
[facility] Patient Name: Michael Ellison Procedure Date: 05/27/2016 1:43 PM MRN: [id number] Date of Birth: 05-20-1946 Attending MD: Norvel Richards , MD CSN: [id number] Age: 70 Admit Type: Outpatient Procedure:                Colonoscopy with multiple snare                            polypectomies/hemostasis clip placement Indications:              Screening for colorectal malignant neoplasm -first                            ever colonoscopy Providers:                Norvel Richards, MD, Renda Rolls, RN, Randa Spike, Technician Referring MD:              Medicines:                Midazolam 5 mg IV, Meperidine 50 mg IV, Ondansetron                            4 mg SL Complications:            No immediate complications. Estimated Blood Loss:     Estimated blood loss was minimal. Procedure:                Pre-Anesthesia Assessment:                           - Prior to the procedure, a History and Physical                            was performed, and patient medications and                            allergies were reviewed. The patient's tolerance of                            previous anesthesia was also reviewed. The risks                            and benefits of the procedure and the sedation                            options and risks were discussed with the patient.                            All questions were answered, and informed consent                            was obtained. Prior Anticoagulants: The patient has  taken no previous anticoagulant or antiplatelet                            agents. ASA Grade Assessment: II - A patient with                            mild systemic disease. After reviewing the risks                            and benefits, the patient was deemed in                            satisfactory condition to undergo the procedure.                           After obtaining informed  consent, the colonoscope                            was passed under direct vision. Throughout the                            procedure, the patient's blood pressure, pulse, and                            oxygen saturations were monitored continuously. The                            EC-[id number] scope was introduced through                            the anus and advanced to the the cecum, identified                            by appendiceal orifice and ileocecal valve. The                            ileocecal valve, appendiceal orifice, and rectum                            were photographed. The entire colon was well                            visualized. The quality of the bowel preparation                            was adequate. Scope In: 2:06:00 PM Scope Out: 2:37:03 PM Scope Withdrawal Time: 0 hours 15 minutes 19 seconds  Total Procedure Duration: 0 hours 31 minutes 3 seconds  Findings:      The perianal and digital rectal examinations were normal.      (1) 3x14 mm polyp was found in the sigmoid colon. The polyp was       pedunculated. The polyp was removed with a hot snare. Resection and       retrieval were complete. Oozing at the polypectomy  base was treated with       1 resolution clip..      (1) 8 mm polyp was found in the descending colon. The polyp was       multi-lobulated. The polyp was removed with a hot snare. Resection and       retrieval were complete. Estimated blood loss: none.      Multiple small and large-mouthed diverticula were found in the sigmoid       colon, descending colon and transverse colon. A segment of ascending       colon was extrinsically compressed and "pinched" likely due to       sequestration in his abdominal hernia. Impression:               - One 14 mm polyp in the sigmoid colon, removed                            with a hot snare. Resected and retrieved.                            Hemostasis clip placed                           -  One 8 mm polyp in the descending colon, removed                            with a hot snare. Resected and retrieved.                           - Diverticulosis in the sigmoid colon, in the                            descending colon and in the transverse colon.                            Extrinsic compression likely due to his abdominal                            wall hernia. Moderate Sedation:      Moderate (conscious) sedation was administered by the endoscopy nurse       and supervised by the endoscopist. The following parameters were       monitored: oxygen saturation, heart rate, blood pressure, respiratory       rate, EKG, adequacy of pulmonary ventilation, and response to care.       Total physician intraservice time was 35 minutes. Recommendation:           - Patient has a contact number available for                            emergencies. The signs and symptoms of potential                            delayed complications were discussed with the                            patient. Return to normal  activities tomorrow.                            Written discharge instructions were provided to the                            patient.                           - Advance diet as tolerated.                           - Continue present medications.                           - Repeat colonoscopy date to be determined after                            pending pathology results are reviewed for                            surveillance based on pathology results.                           - Return to GI clinic (date not yet determined).                            Keep upcoming appointment with Dr. Hulen Skains. No future                            MRI until clip placed is no longer present. Procedure Code(s):        --- Professional ---                           [phone], Colonoscopy, flexible; with removal of                            tumor(s), polyp(s), or other lesion(s) by snare                             technique                           99152, Moderate sedation services provided by the                            same physician or other qualified health care                            professional performing the diagnostic or                            therapeutic service that the sedation supports,                            requiring the presence of an  independent trained                            observer to assist in the monitoring of the                            patient's level of consciousness and physiological                            status; initial 15 minutes of intraservice time,                            patient age 13 years or older                           [id number], Moderate sedation services; each additional                            15 minutes intraservice time Diagnosis Code(s):        --- Professional ---                           Z12.11, Encounter for screening for malignant                            neoplasm of colon                           D12.5, Benign neoplasm of sigmoid colon                           D12.4, Benign neoplasm of descending colon                           K57.30, Diverticulosis of large intestine without                            perforation or abscess without bleeding CPT copyright 2016 American Medical Association. All rights reserved. The codes documented in this report are preliminary and upon coder review may  be revised to meet current compliance requirements. Cristopher Estimable. Cary Lothrop, MD Norvel Richards, MD 05/27/2016 2:52:33 PM This report has been signed electronically. Number of Addenda: 0

## 2016-05-27 NOTE — Interval H&P Note (Signed)
History and Physical Interval Note:  05/27/2016 1:55 PM  Michael Ellison  has presented today for surgery, with the diagnosis of SCREENING  The various methods of treatment have been discussed with the patient and family. After consideration of risks, benefits and other options for treatment, the patient has consented to  Procedure(s) with comments: COLONOSCOPY (N/A) - 200 as a surgical intervention .  The patient's history has been reviewed, patient examined, no change in status, stable for surgery.  I have reviewed the patient's chart and labs.  Questions were answered to the patient's satisfaction.     [name]  No change. First ever screening colonoscopy.  The risks, benefits, limitations, alternatives and imponderables have been reviewed with the patient. Questions have been answered. All parties are agreeable.

## 2016-05-27 NOTE — Discharge Instructions (Signed)
Colonoscopy Discharge Instructions  Read the instructions outlined below and refer to this sheet in the next few weeks. These discharge instructions provide you with general information on caring for yourself after you leave the hospital. Your doctor may also give you specific instructions. While your treatment has been planned according to the most current medical practices available, unavoidable complications occasionally occur. If you have any problems or questions after discharge, call Dr. [name] at [phone]. ACTIVITY  You may resume your regular activity, but move at a slower pace for the next 24 hours.   Take frequent rest periods for the next 24 hours.   Walking will help get rid of the air and reduce the bloated feeling in your belly (abdomen).   No driving for 24 hours (because of the medicine (anesthesia) used during the test).    Do not sign any important legal documents or operate any machinery for 24 hours (because of the anesthesia used during the test).  NUTRITION  Drink plenty of fluids.   You may resume your normal diet as instructed by your doctor.   Begin with a light meal and progress to your normal diet. Heavy or fried foods are harder to digest and may make you feel sick to your stomach (nauseated).   Avoid alcoholic beverages for 24 hours or as instructed.  MEDICATIONS  You may resume your normal medications unless your doctor tells you otherwise.  WHAT YOU CAN EXPECT TODAY  Some feelings of bloating in the abdomen.   Passage of more gas than usual.   Spotting of blood in your stool or on the toilet paper.  IF YOU HAD POLYPS REMOVED DURING THE COLONOSCOPY:  No aspirin products for 7 days or as instructed.   No alcohol for 7 days or as instructed.   Eat a soft diet for the next 24 hours.  FINDING OUT THE RESULTS OF YOUR TEST Not all test results are available during your visit. If your test results are not back during the visit, make an appointment  with your caregiver to find out the results. Do not assume everything is normal if you have not heard from your caregiver or the medical facility. It is important for you to follow up on all of your test results.  SEEK IMMEDIATE MEDICAL ATTENTION IF:  You have more than a spotting of blood in your stool.   Your belly is swollen (abdominal distention).   You are nauseated or vomiting.   You have a temperature over 101.   You have abdominal pain or discomfort that is severe or gets worse throughout the day.    Colon polyp and diverticulosis information provided  Further recommendations to follow pending review of pathology report  No MRI until clip gone:  Keep appointment to see Dr. Hulen Skains   Diverticulosis Diverticulosis is the condition that develops when small pouches (diverticula) form in the wall of your colon. Your colon, or large intestine, is where water is absorbed and stool is formed. The pouches form when the inside layer of your colon pushes through weak spots in the outer layers of your colon. CAUSES  No one knows exactly what causes diverticulosis. RISK FACTORS  Being older than 97. Your risk for this condition increases with age. Diverticulosis is rare in people younger than 40 years. By age 40, almost everyone has it.  Eating a low-fiber diet.  Being frequently constipated.  Being overweight.  Not getting enough exercise.  Smoking.  Taking over-the-counter pain medicines, like aspirin  and ibuprofen. SYMPTOMS  Most people with diverticulosis do not have symptoms. DIAGNOSIS  Because diverticulosis often has no symptoms, health care providers often discover the condition during an exam for other colon problems. In many cases, a health care provider will diagnose diverticulosis while using a flexible scope to examine the colon (colonoscopy). TREATMENT  If you have never developed an infection related to diverticulosis, you may not need treatment. If you have had  an infection before, treatment may include:  Eating more fruits, vegetables, and grains.  Taking a fiber supplement.  Taking a live bacteria supplement (probiotic).  Taking medicine to relax your colon. HOME CARE INSTRUCTIONS   Drink at least 6-8 glasses of water each day to prevent constipation.  Try not to strain when you have a bowel movement.  Keep all follow-up appointments. If you have had an infection before:  Increase the fiber in your diet as directed by your health care provider or dietitian.  Take a dietary fiber supplement if your health care provider approves.  Only take medicines as directed by your health care provider. SEEK MEDICAL CARE IF:   You have abdominal pain.  You have bloating.  You have cramps.  You have not gone to the bathroom in 3 days. SEEK IMMEDIATE MEDICAL CARE IF:   Your pain gets worse.  Yourbloating becomes very bad.  You have a fever or chills, and your symptoms suddenly get worse.  You begin vomiting.  You have bowel movements that are bloody or black. MAKE SURE YOU:  Understand these instructions.  Will watch your condition.  Will get help right away if you are not doing well or get worse.   This information is not intended to replace advice given to you by your health care provider. Make sure you discuss any questions you have with your health care provider.   Document Released: 07/01/2004 Document Revised: 10/09/2013 Document Reviewed: 08/29/2013 Elsevier Interactive Patient Education 2016 Elsevier Inc.   Colon Polyps Polyps are lumps of extra tissue growing inside the body. Polyps can grow in the large intestine (colon). Most colon polyps are noncancerous (benign). However, some colon polyps can become cancerous over time. Polyps that are larger than a pea may be harmful. To be safe, caregivers remove and test all polyps. CAUSES  Polyps form when mutations in the genes cause your cells to grow and divide even  though no more tissue is needed. RISK FACTORS There are a number of risk factors that can increase your chances of getting colon polyps. They include:  Being older than 50 years.  Family history of colon polyps or colon cancer.  Long-term colon diseases, such as colitis or Crohn disease.  Being overweight.  Smoking.  Being inactive.  Drinking too much alcohol. SYMPTOMS  Most small polyps do not cause symptoms. If symptoms are present, they may include:  Blood in the stool. The stool may look dark red or black.  Constipation or diarrhea that lasts longer than 1 week. DIAGNOSIS People often do not know they have polyps until their caregiver finds them during a regular checkup. Your caregiver can use 4 tests to check for polyps:  Digital rectal exam. The caregiver wears gloves and feels inside the rectum. This test would find polyps only in the rectum.  Barium enema. The caregiver puts a liquid called barium into your rectum before taking X-rays of your colon. Barium makes your colon look white. Polyps are dark, so they are easy to see in the X-ray pictures.  Sigmoidoscopy. A thin, flexible tube (sigmoidoscope) is placed into your rectum. The sigmoidoscope has a light and tiny camera in it. The caregiver uses the sigmoidoscope to look at the last third of your colon.  Colonoscopy. This test is like sigmoidoscopy, but the caregiver looks at the entire colon. This is the most common method for finding and removing polyps. TREATMENT  Any polyps will be removed during a sigmoidoscopy or colonoscopy. The polyps are then tested for cancer. PREVENTION  To help lower your risk of getting more colon polyps:  Eat plenty of fruits and vegetables. Avoid eating fatty foods.  Do not smoke.  Avoid drinking alcohol.  Exercise every day.  Lose weight if recommended by your caregiver.  Eat plenty of calcium and folate. Foods that are rich in calcium include milk, cheese, and broccoli.  Foods that are rich in folate include chickpeas, kidney beans, and spinach. HOME CARE INSTRUCTIONS Keep all follow-up appointments as directed by your caregiver. You may need periodic exams to check for polyps. SEEK MEDICAL CARE IF: You notice bleeding during a bowel movement.   This information is not intended to replace advice given to you by your health care provider. Make sure you discuss any questions you have with your health care provider.   Document Released: 06/30/2004 Document Revised: 10/25/2014 Document Reviewed: 12/14/2011 Elsevier Interactive Patient Education [employer].

## 2016-05-27 NOTE — H&P (View-Only) (Signed)
Primary Care Physician:  Rosita Fire, MD  Primary Gastroenterologist:  Garfield Cornea, MD   Chief Complaint  Patient presents with  . Colonoscopy    having constipation    HPI:  SHMAR FOCHS is a 70 y.o. male here For consideration of colonoscopy. No prior colonoscopy. He has intermittent constipation, takes MiraLAX or milk of magnesia when necessary. Denies abdominal pain, prior blood per rectum, melena. His appetite is good. No heartburn. No dysphagia. No vomiting. Denies any unintentional weight loss. No family history of colon cancer. He does have large abdominal wall hernia, plans to follow-up with his surgeon.  Patient has a complicated past medical history. In 2013 he required exploratory laparotomy with small bowel resection for small bowel obstruction. Complicated by postop ileus and fascial dehiscence requiring return to the OR. He had acute respiratory failure with prolonged intubation and eventual tracheostomy. History of COPD with exacerbation, last hospitalization every year ago. Required Zenker's diverticulectomy in 2013 as well. 2014 he had ventral hernia repair and subsequent I&D of abdominal wall abscess couple weeks later.   Current Outpatient Prescriptions  Medication Sig Dispense Refill  . albuterol (PROVENTIL HFA;VENTOLIN HFA) 108 (90 Base) MCG/ACT inhaler Inhale 2 puffs into the lungs every 4 (four) hours as needed for wheezing or shortness of breath. 1 Inhaler 0  . amLODipine (NORVASC) 5 MG tablet Take 5 mg by mouth daily.    Marland Kitchen aspirin EC 81 MG tablet Take 81 mg by mouth once a week.    . budesonide-formoterol (SYMBICORT) 160-4.5 MCG/ACT inhaler Inhale 2 puffs into the lungs 2 (two) times daily. 1 Inhaler 12  . escitalopram (LEXAPRO) 10 MG tablet Take 10 mg by mouth daily.    Marland Kitchen ipratropium-albuterol (DUONEB) 0.5-2.5 (3) MG/3ML SOLN Take 3 mLs by nebulization every 6 (six) hours as needed (for shortness of breath/wheezing).     Marland Kitchen lisinopril (PRINIVIL,ZESTRIL) 20 MG  tablet Take 20 mg by mouth 2 (two) times daily.     . traMADol-acetaminophen (ULTRACET) 37.5-325 MG per tablet Take 1 tablet by mouth 2 (two) times daily as needed for moderate pain or severe pain.      No current facility-administered medications for this visit.    Allergies as of 05/05/2016 - Review Complete 05/05/2016  Allergen Reaction Noted  . Lorazepam Other (See Comments) 02/27/2012    Past Medical History  Diagnosis Date  . Hypertension   . Cancer ([place])   . Cancer of prostate ([place])   . Depression   . Asthma   . Shortness of breath   . COPD (chronic obstructive pulmonary disease) ([place])     home O2  . Aspiration pneumonia ([place]) 03/01/2012  . Hernia of abdominal cavity   . On home O2     3L prn    Past Surgical History  Procedure Laterality Date  . Hernia repair    . Prostate biopsy    . Robotic prostate surgery  2011  . Laparotomy  02/25/2012    Procedure: EXPLORATORY LAPAROTOMY;  Surgeon: Donato Heinz, MD;  Location: [facility];  Service: General;  Laterality: N/A;  . Bowel resection  02/25/2012    Procedure: SMALL BOWEL RESECTION;  Surgeon: Donato Heinz, MD;  Location: [facility];  Service: General;;  . Wound debridement  03/02/2012    Procedure: DEBRIDEMENT CLOSURE/ABDOMINAL WOUND;  Surgeon: Gwenyth Ober, MD;  Location: [place];  Service: General;  Laterality: N/A;  . Tracheostomy  03/2012  . Ventral hernia repair  12/25/2012  Dr Hulen Skains  . Ventral hernia repair N/A 12/25/2012    Procedure: HERNIA REPAIR VENTRAL ADULT;  Surgeon: Gwenyth Ober, MD;  Location: [place];  Service: General;  Laterality: N/A;  . Insertion of mesh N/A 12/25/2012    Procedure: INSERTION OF MESH;  Surgeon: Gwenyth Ober, MD;  Location: [place];  Service: General;  Laterality: N/A;  . Incision and drainage abscess N/A 01/15/2013    Procedure: INCISION AND DRAINAGE Abdominal Wall ABSCESS;  Surgeon: Gwenyth Ober, MD;  Location: [place];  Service: General;  Laterality: N/A;  . Zenker's diverticulectomy   2013    Dr. Redmond Baseman    Family History  Problem Relation Age of Onset  . Pseudochol deficiency Neg Hx   . Anesthesia problems Neg Hx   . Hypotension Neg Hx   . Malignant hyperthermia Neg Hx   . Colon cancer Neg Hx     Social History   Social History  . Marital Status: Divorced    Spouse Name: N/A  . Number of Children: N/A  . Years of Education: N/A   Occupational History  . retired     Corporate treasurer   Social History Main Topics  . Smoking status: Former Smoker -- 1.50 packs/day for 44 years    Types: Cigarettes    Quit date: 02/22/2012  . Smokeless tobacco: Never Used     Comment: Quit x 4 years  . Alcohol Use: No     Comment: occasional beer 1-2  . Drug Use: No  . Sexual Activity: No   Other Topics Concern  . Not on file   Social History Narrative      ROS:  General: Negative for anorexia, weight loss, fever, chills, fatigue, weakness. Eyes: Negative for vision changes.  ENT: Negative for hoarseness, difficulty swallowing , nasal congestion. CV: Negative for chest pain, angina, palpitations, dyspnea on exertion, peripheral edema.  Respiratory: Negative for dyspnea at rest,  cough, sputum, +wheezing. +intermittent DOE GI: See history of present illness. GU:  Negative for dysuria, hematuria, urinary incontinence, urinary frequency, nocturnal urination.  MS: Negative for joint pain, low back pain.  Derm: Negative for rash or itching.  Neuro: Negative for weakness, abnormal sensation, seizure, frequent headaches, memory loss, confusion.  Psych: Negative for anxiety, depression, suicidal ideation, hallucinations.  Endo: Negative for unusual weight change.  Heme: Negative for bruising or bleeding. Allergy: Negative for rash or hives.    Physical Examination:  BP 168/80 mmHg  Pulse 92  Temp(Src) 97.5 F (36.4 C) (Oral)  Ht 6\' 3"  (1.905 m)  Wt 198 lb 6.4 oz (89.994 kg)  BMI 24.80 kg/m2   General: Well-nourished, well-developed in no acute distress.  Head:  Normocephalic, atraumatic.   Eyes: Conjunctiva pink, no icterus. Mouth: Oropharyngeal mucosa moist and pink , no lesions erythema or exudate. Neck: Supple without thyromegaly, masses, or lymphadenopathy.  Lungs: Clear to auscultation bilaterally.  Heart: Regular rate and rhythm, no murmurs rubs or gallops.  Abdomen: Bowel sounds are normal, nontender, nondistended, no hepatosplenomegaly or masses, no abdominal bruits. Large hernia lower abd, soft, easily reducible and nontender.   Rectal: not performed Extremities: No lower extremity edema. No clubbing or deformities.  Neuro: Alert and oriented x 4 , grossly normal neurologically.  Skin: Warm and dry, no rash or jaundice.   Psych: Alert and cooperative, normal mood and affect.

## 2016-05-28 ENCOUNTER — Telehealth: Payer: Self-pay | Admitting: Internal Medicine

## 2016-05-28 NOTE — Telephone Encounter (Signed)
Pt called back to correct a phone number. It's [phone]

## 2016-05-28 NOTE — Telephone Encounter (Signed)
RMR did a procedure on patient yesterday and the patient would like some information about what all was done eg: clips.  He said you can reach him at [phone] or [phone]

## 2016-05-28 NOTE — Telephone Encounter (Signed)
Tried to call pt- phone was busy. 

## 2016-05-31 DIAGNOSIS — K432 Incisional hernia without obstruction or gangrene: Secondary | ICD-10-CM | POA: Diagnosis not present

## 2016-05-31 DIAGNOSIS — J449 Chronic obstructive pulmonary disease, unspecified: Secondary | ICD-10-CM | POA: Diagnosis not present

## 2016-06-01 ENCOUNTER — Encounter (HOSPITAL_COMMUNITY): Payer: Self-pay | Admitting: Internal Medicine

## 2016-06-01 NOTE — Telephone Encounter (Signed)
Tried to call number given x2 and it would ring one time and then stop. Called pts home number and talked with the pt. Answered all his questions.

## 2016-06-03 ENCOUNTER — Encounter: Payer: Self-pay | Admitting: Internal Medicine

## 2016-06-08 ENCOUNTER — Telehealth: Payer: Self-pay | Admitting: Emergency Medicine

## 2016-06-08 NOTE — Telephone Encounter (Signed)
We received a surgical clearance request from [facility]. Pt will need an appointment for this. Attempted to contact pt. No answer, no option to leave a message. Will try back.

## 2016-06-10 NOTE — Telephone Encounter (Signed)
atc pt X2, no answer, no vm set up.  Wcb.  

## 2016-06-18 NOTE — Telephone Encounter (Signed)
Pt called back. appt scheduled 9/5 at 2:15. Nothing further needed.

## 2016-06-22 ENCOUNTER — Ambulatory Visit (INDEPENDENT_AMBULATORY_CARE_PROVIDER_SITE_OTHER): Payer: Medicare Other | Admitting: Emergency Medicine

## 2016-06-22 ENCOUNTER — Encounter: Payer: Self-pay | Admitting: Emergency Medicine

## 2016-06-22 DIAGNOSIS — J410 Simple chronic bronchitis: Secondary | ICD-10-CM

## 2016-06-22 NOTE — Progress Notes (Signed)
Subjective:    Patient ID: Michael Ellison, male    DOB: November 25, 1945, 70 y.o.   MRN: [id number]  HPI 70 y.o. y/o male, smoker, with a PMH of ETOH, HTN, Prostate Cancer [facility] with nausea / vomiting and abdominal pain secondary to small bowel obstruction due to adhesions. Underwent exploratory laparotomy with small bowel resection 5/10. Developed delirium post-op. [facility] for respiratory failure requiring intubation. Developed abdominal fascial dehiscence and taken back to OR 5/16. Failed extubation, and required tracheostomy 5/22. Weaned to [facility] 6/21. He was noted to have dysphagia and was evaluated by Speech Therapy and found to have a large Zenker diverticulum. He is status post Endoscopic Zenker diverticulectomy 7/2. Swallowing / diet was advanced post procedure. He was decanulated 8/7, PEG tube out 8/1. Presents now for eval of his COPD.   He was d/c to home on albuterol nebs q6h and pulmicort nebs q12h. Tells me that he is having some exertional SOB. He does not have any O2 at this time.   ROV 0000000 -- Complicated hospitalization as above, presents back today for COPD. PFT today with moderately severe AFL, restriction on volumes, decreased DLCO that corrects for Va. Hypoxemia identified last week - started O2. On albuterol prn + pulmicort. He does still have DOE. Wearing his O2 reliably.   ROV 10/12/12 -- COPD with moderately severe AFL. He is only using his O2 prn. He is on Spiriva, started 10/13. Also doing pulmicort + albuterol. He can't tell much difference, but he is less congested, having less cough. He uses albuterol about 2x a day.  He is active, walks every morning.   ROV 07/13/13 -- COPD, hypoxemia. On spiriva + pulmicort/albuterol has been using prn. Returns today for f/u. He is wearing O2 more reliably, mainly because he has been having more trouble for last 3-4 weeks. He believes he has been worse since his hernia surgery. He is coughing, white  mucous. He is dyspneic especially at night when supine.   ROV 03/06/14 -- returns for COPD, hypoxemia. Last time we treated for an acute flare, added Breo to spirva stopped pulmicort. He ran out of [place] after the samples ran out, Spiriva sometime before March.  Reports more dyspnea. He was just prescribed duonebs by his PCP to take qid. He hasn't smoked since 3 yrs ago.   ROV 03/29/14 -- follow up for COPD, hypoxemia. Last time we started Anoro and feels he has benefited. SABA use is low. Occas wheeze and cough > better. He likes the new medication  ROV 09/05/14 -- follow up visit for COPD and associated hypoxemia. He tells me that he sometimes has nocturnal dyspnea. He is staying active, but does have trouble with hills. He has a binder on for an abdominal hernia. He was temporarily on Anoro, now back to Spiriva for cost reasons. Uses SABA prn, about every other day. No flares since July. Only using O2 prn.   ROV 06/22/16 -- This follow-up visit for patient with a history of hypoxemic respiratory failure and COPD. He also has a history of tracheostomy After a Complicated hospitalization as detailed above. He has been decannulated. He returns today for follow-up and preoperative evaluation and preparation for hernia repair. His spiriva has been changed to symbicort since our last visit. He feels that this is an improvement. No significant breathing problems. No longer uses oxygen. He is able to shop, do chores, yardwork, go to church. No cough, no wheeze. He has had one  AE in setting of a URI. He is following with Dr Hulen Skains regarding possible hernia repair.      Objective:   Physical Exam Vitals:   06/22/16 1427  BP: (!) 144/92  BP Location: Right Arm  Cuff Size: Normal  Pulse: 72  SpO2: 98%  Weight: 204 lb 6.4 oz (92.7 kg)  Height: 6' 2.5" (1.892 m)   Gen: Pleasant, thin, in no distress,  normal affect  ENT: No lesions,  mouth clear,  oropharynx clear, no postnasal drip  Neck: No JVD, no TMG,  no carotid bruits, Old stoma is closed  Lungs: No use of accessory muscles, No wheezes  Cardiovascular: RRR, heart sounds normal, no murmur or gallops, no peripheral edema  Musculoskeletal: No deformities, no cyanosis or clubbing  Neuro: alert, non focal  Skin: Warm, no lesions or rashes     Assessment & Plan:  COPD (chronic obstructive pulmonary disease) Moderate COPD. He appears to have responded very well to the addition of Symbicort. He is no longer using Spiriva. He does not use albuterol with any frequency. He has had one exacerbation since our last visit in 2015. He is being evaluated for possible hernia repair and general anesthesia. I believe that his COPD puts him at moderately increased risk for anesthesia but certainly does not preclude a procedure. We will perform spirometry today to ensure that there has not been a significant change compared with 2013. We will offer him the flu shot today  Walking oximetry today Spirometry today Please continue Symbicort 2 puffs twice a day Please get the flu shot this Fall.  Your COPD puts you at moderately increased risk for general anesthesia, including prolonged mechanical ventilation or hospitalization. This does not mean that he cannot get your hernia surgery as planned. We will forward this information to Dr. Hulen Skains Follow with Dr Lamonte Sakai in 12 months or sooner if you have any problems   Baltazar Apo, MD, PhD 06/22/2016, 2:45 PM [facility] [phone] or if no answer [phone]

## 2016-06-22 NOTE — Addendum Note (Signed)
Addended by: [name] Cagey on: 06/22/2016 03:00 PM   Modules accepted: Orders

## 2016-06-22 NOTE — Patient Instructions (Signed)
Walking oximetry today Spirometry today Please continue Symbicort 2 puffs twice a day Please get the flu shot this Fall.  Your COPD puts you at moderately increased risk for general anesthesia, including prolonged mechanical ventilation or hospitalization. This does not mean that he cannot get your hernia surgery as planned. We will forward this information to Dr. Hulen Skains Follow with Dr Lamonte Sakai in 12 months or sooner if you have any problems

## 2016-06-22 NOTE — Assessment & Plan Note (Signed)
Moderate COPD. He appears to have responded very well to the addition of Symbicort. He is no longer using Spiriva. He does not use albuterol with any frequency. He has had one exacerbation since our last visit in 2015. He is being evaluated for possible hernia repair and general anesthesia. I believe that his COPD puts him at moderately increased risk for anesthesia but certainly does not preclude a procedure. We will perform spirometry today to ensure that there has not been a significant change compared with 2013. We will offer him the flu shot today  Walking oximetry today Spirometry today Please continue Symbicort 2 puffs twice a day Please get the flu shot this Fall.  Your COPD puts you at moderately increased risk for general anesthesia, including prolonged mechanical ventilation or hospitalization. This does not mean that he cannot get your hernia surgery as planned. We will forward this information to Dr. Hulen Skains Follow with Dr Lamonte Sakai in 12 months or sooner if you have any problems

## 2016-08-06 ENCOUNTER — Ambulatory Visit: Payer: Self-pay | Admitting: General Surgery

## 2016-08-19 NOTE — Pre-Procedure Instructions (Addendum)
Michael Ellison  08/19/2016      [facility] - [place], [place] - [address] [place] [address] Phone: [phone] Fax: [phone]    Your procedure is scheduled on   Thursday 08/26/16  Report to [facility] Admitting at 600 A.M.  Call this number if you have problems the morning of surgery:  [phone]   Remember:  Do not eat food or drink liquids after midnight.  Take these medicines the morning of surgery with A SIP OF WATER   ALBUTEROL INHALER, AMLODIPINE (NORVASC), SYMBICORT, ESCITALOPRAM (LEXAPRO), TOVIAZ, DUONEB NEBULIZER, ULTACET IF NEEDED    (STOP 7  DAYS PRIOR TO SURGERY-  ASPIRIN OR ASPIRIN  PRODUCTS, IBUPROFEN/ ADVIL/ MOTRIN/ ALEVE, GOODY POWDERS, BC'S, HERBAL MEDICINES)    Do not wear jewelry, make-up or nail polish.  Do not wear lotions, powders, or perfumes, or deoderant.  Do not shave 48 hours prior to surgery.  Men may shave face and neck.  Do not bring valuables to the hospital.  [facility] is not responsible for any belongings or valuables.  Contacts, dentures or bridgework may not be worn into surgery.  Leave your suitcase in the car.  After surgery it may be brought to your room.  For patients admitted to the hospital, discharge time will be determined by your treatment team.  Patients discharged the day of surgery will not be allowed to drive home.   Name and phone number of your driver:    Special instructions:  [place] - Preparing for Surgery  Before surgery, you can play an important role.  Because skin is not sterile, your skin needs to be as free of germs as possible.  You can reduce the number of germs on you skin by washing with CHG (chlorahexidine gluconate) soap before surgery.  CHG is an antiseptic cleaner which kills germs and bonds with the skin to continue killing germs even after washing.  Please DO NOT use if you have an allergy to CHG or antibacterial soaps.  If your skin becomes  reddened/irritated stop using the CHG and inform your nurse when you arrive at Short Stay.  Do not shave (including legs and underarms) for at least 48 hours prior to the first CHG shower.  You may shave your face.  Please follow these instructions carefully:   1.  Shower with CHG Soap the night before surgery and the                                morning of Surgery.  2.  If you choose to wash your hair, wash your hair first as usual with your       normal shampoo.  3.  After you shampoo, rinse your hair and body thoroughly to remove the                      Shampoo.  4.  Use CHG as you would any other liquid soap.  You can apply chg directly       to the skin and wash gently with scrungie or a clean washcloth.  5.  Apply the CHG Soap to your body ONLY FROM THE NECK DOWN.        Do not use on open wounds or open sores.  Avoid contact with your eyes,       ears, mouth and genitals (private  parts).  Wash genitals (private parts)       with your normal soap.  6.  Wash thoroughly, paying special attention to the area where your surgery        will be performed.  7.  Thoroughly rinse your body with warm water from the neck down.  8.  DO NOT shower/wash with your normal soap after using and rinsing off       the CHG Soap.  9.  Pat yourself dry with a clean towel.            10.  Wear clean pajamas.            11.  Place clean sheets on your bed the night of your first shower and do not        sleep with pets.  Day of Surgery  Do not apply any lotions/deoderants the morning of surgery.  Please wear clean clothes to the hospital/surgery center.    Please read over the following fact sheets that you were given. Pain Booklet

## 2016-08-20 ENCOUNTER — Ambulatory Visit (HOSPITAL_COMMUNITY)
Admission: RE | Admit: 2016-08-20 | Discharge: 2016-08-20 | Disposition: A | Discharge status: Home / Self Care | Payer: Medicare Other | Source: Ambulatory Visit | Attending: General Surgery | Admitting: General Surgery

## 2016-08-20 ENCOUNTER — Encounter (HOSPITAL_COMMUNITY)
Admission: RE | Admit: 2016-08-20 | Discharge: 2016-08-20 | Disposition: A | Discharge status: Home / Self Care | Payer: Medicare Other | Source: Ambulatory Visit | Attending: General Surgery | Admitting: General Surgery

## 2016-08-20 ENCOUNTER — Encounter (HOSPITAL_COMMUNITY): Payer: Self-pay

## 2016-08-20 DIAGNOSIS — J449 Chronic obstructive pulmonary disease, unspecified: Secondary | ICD-10-CM | POA: Diagnosis not present

## 2016-08-20 DIAGNOSIS — I509 Heart failure, unspecified: Secondary | ICD-10-CM | POA: Insufficient documentation

## 2016-08-20 DIAGNOSIS — K432 Incisional hernia without obstruction or gangrene: Secondary | ICD-10-CM | POA: Diagnosis not present

## 2016-08-20 DIAGNOSIS — Z01812 Encounter for preprocedural laboratory examination: Secondary | ICD-10-CM | POA: Diagnosis not present

## 2016-08-20 DIAGNOSIS — Z01818 Encounter for other preprocedural examination: Secondary | ICD-10-CM

## 2016-08-20 HISTORY — DX: Gastro-esophageal reflux disease without esophagitis: K21.9

## 2016-08-20 HISTORY — DX: Unspecified osteoarthritis, unspecified site: M19.90

## 2016-08-20 LAB — CBC WITH DIFFERENTIAL/PLATELET
BASOS ABS: 0.1 10*3/uL (ref 0.0–0.1)
Basophils Relative: 1 %
EOS ABS: 0.3 10*3/uL (ref 0.0–0.7)
EOS PCT: 3 %
HCT: 49.9 % (ref 39.0–52.0)
Hemoglobin: 17.2 g/dL — ABNORMAL HIGH (ref 13.0–17.0)
LYMPHS PCT: 23 %
Lymphs Abs: 2.2 10*3/uL (ref 0.7–4.0)
MCH: 28.3 pg (ref 26.0–34.0)
MCHC: 34.5 g/dL (ref 30.0–36.0)
MCV: 82.1 fL (ref 78.0–100.0)
MONO ABS: 0.6 10*3/uL (ref 0.1–1.0)
Monocytes Relative: 6 %
Neutro Abs: 6.4 10*3/uL (ref 1.7–7.7)
Neutrophils Relative %: 67 %
PLATELETS: 186 10*3/uL (ref 150–400)
RBC: 6.08 MIL/uL — ABNORMAL HIGH (ref 4.22–5.81)
RDW: 14.7 % (ref 11.5–15.5)
WBC: 9.5 10*3/uL (ref 4.0–10.5)

## 2016-08-20 LAB — BASIC METABOLIC PANEL
Anion gap: 6 (ref 5–15)
BUN: 13 mg/dL (ref 6–20)
CO2: 27 mmol/L (ref 22–32)
CREATININE: 1.09 mg/dL (ref 0.61–1.24)
Calcium: 9.7 mg/dL (ref 8.9–10.3)
Chloride: 102 mmol/L (ref 101–111)
GFR calc Af Amer: >60 mL/min (ref >60)
GLUCOSE: 108 mg/dL — AB (ref 65–99)
POTASSIUM: 4.7 mmol/L (ref 3.5–5.1)
SODIUM: 135 mmol/L (ref 135–145)

## 2016-08-20 LAB — SURGICAL PCR SCREEN
MRSA, PCR: POSITIVE — AB
Staphylococcus aureus: POSITIVE — AB

## 2016-08-23 ENCOUNTER — Encounter (HOSPITAL_COMMUNITY): Payer: Self-pay

## 2016-08-23 NOTE — Progress Notes (Signed)
Anesthesia Chart Review: Patient is a 70 year old male scheduled for ventral hernia repair on 08/26/16 by Dr. Hulen Skains.  History includes former smoker, HTN, prostate cancer s/p robotic assisted radical prostatectomy (with left inguinal hernia repair and UHR) 11/12/09 (Dr. Brendia Sacks, [facility]), asthma, SOB, COPD on PRN home O2, GERD, arthritis, SBO s/p exploratory laparotomy and small bowel resection [id number] ([facility]) complicated post-operative aspiration PNA with acute respiratory failure (s/p transfer to [facility] and intubation 02/29/12; extubated 03/05/12 with reintubation 03/07/12 s/p tracheostomy 03/08/12 with decannulation 05/24/12; G-tube 04/04/12-05/18/12) and by fascial dehiscence s/p closure 03/02/12, s/p endoscopic Zenker's diverticulectomy 04/17/12, s/p ventral hernia repair with Stratus mesh [id number] complicated by ifected abdominal wall hematoma s/p I&D 01/15/13. ED visit 11/14/15 for wheezing and SOB. He was given O2, nebulizer, and steroids and discharged home. He had a colonoscopy on 05/27/16 which showed sigmoid diverticulosis and sigmoid and descending colon polyps s/p removal (no high grade dysplasia). Some records indicate he had a history of heavy ETOH use, but he says that he quit smoking and drinking ETOH after his 02/2012 hospitalization.   PCP is Dr. Rosita Fire. He reports last visit was ~ 05/2016.  Pulmonologist is Dr. Lamonte Sakai, last visit 06/22/16. In regards to upcoming surgery, he wrote, "Your COPD puts you at moderately increased risk for general anesthesia, including prolonged mechanical ventilation or hospitalization. This does not mean that he cannot get your hernia surgery as planned. We will forward this information to Dr. Hulen Skains."  Meds include albuterol, ASA 81mg  PRN arthritis pain (on hold), amlodipine, Symbicort, Lexapro, fesoterodine, HCTZ, Duoneb, lisinopril, Ultracet.   BP (!) 142/88   Pulse 80   Temp 36.7 C   Resp 20   Ht 6' 2.5" (1.892 m)   Wt 205 lb 12.8 oz (93.4 kg)   SpO2 100%   BMI 26.07  kg/m   11/14/15 EKG: SR, multiple PVC's (two), baseline wanderer, artifact. When compared to 03/26/15 tracing, PVCs are now present.   04/18/14 Echo (done during COPD exacerbation admission): Study Conclusions - Left ventricle: The cavity size was normal. Wall thickness was normal. Systolic function was vigorous. The estimated ejection fraction was in the range of 65% to 70%. Wall motion was normal; there were no regional wall motion abnormalities. Doppler parameters are consistent with abnormal left ventricular relaxation (grade 1 diastolic dysfunction).  08/20/16 CXR: FINDINGS: Mediastinum hilar structures normal. Cardiomegaly with mild pulmonary vascular prominence and interstitial prominence. Mild component congestive heart failure cannot be excluded. Prominent changes of bullous COPD. Small right pleural effusion. IMPRESSION: 1. Congestive heart failure with mild bilateral from interstitial edema and small right pleural effusion. 2. Bullous COPD.  06/22/16 Spirometry: FVC 3.70 (81%), FEV1 2.04 (59%). Moderately severe obstruction.   Preoperative labs noted. H/H 17.2/49.9. Cr 1.09. Glucose 108.  I called and spoke with patient, he reports that he feels "good" from a respiratory standpoint. No recent URI, wheezing, or nebulizer use. He is taking Symbicort BID. He has a rare dry cough. He denied syncope, palpitations, edema, chest pain, SOB at rest. 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 Sometimes he will have to rest due to fatigue or SOB, but this is "normal" for him (reports no change in at least a year). He says 10/2015 ED visit was primarily due to him running out of his COPD meds--he reports compliance currently. He denied known MI/CHF history.   I reviewed above with anesthesiologist Dr. Therisa Doyne. Patient has pulmonary clearance after recent evaluation and  reports that he feels at his baseline. Denied any acute  cardiopulmonary issues. No subjective symptoms reported to make me think he is in acute CHF. He will be further evaluated by his anesthesiologist on the day of surgery, but if no acute changes then it is anticpated that he can proceed as planned.  George [facility] Short Stay Center/Anesthesiology Phone [phone] 08/23/2016 2:54 PM

## 2016-08-26 ENCOUNTER — Encounter (HOSPITAL_COMMUNITY)
Admission: RE | Disposition: A | Discharge status: Home Health | Payer: Self-pay | Source: Ambulatory Visit | Attending: General Surgery

## 2016-08-26 ENCOUNTER — Encounter (HOSPITAL_COMMUNITY): Payer: Self-pay | Admitting: Anesthesiology

## 2016-08-26 ENCOUNTER — Inpatient Hospital Stay (HOSPITAL_COMMUNITY): Payer: Medicare Other | Admitting: Vascular Surgery

## 2016-08-26 ENCOUNTER — Inpatient Hospital Stay (HOSPITAL_COMMUNITY): Payer: Medicare Other | Admitting: Anesthesiology

## 2016-08-26 ENCOUNTER — Inpatient Hospital Stay (HOSPITAL_COMMUNITY)
Admission: RE | Admit: 2016-08-26 | Discharge: 2016-09-01 | DRG: 354 | Disposition: A | Discharge status: Home Health | Payer: Medicare Other | Source: Ambulatory Visit | Attending: General Surgery | Admitting: General Surgery

## 2016-08-26 DIAGNOSIS — I10 Essential (primary) hypertension: Secondary | ICD-10-CM | POA: Diagnosis present

## 2016-08-26 DIAGNOSIS — R339 Retention of urine, unspecified: Secondary | ICD-10-CM | POA: Diagnosis not present

## 2016-08-26 DIAGNOSIS — J410 Simple chronic bronchitis: Secondary | ICD-10-CM

## 2016-08-26 DIAGNOSIS — R0602 Shortness of breath: Secondary | ICD-10-CM

## 2016-08-26 DIAGNOSIS — K432 Incisional hernia without obstruction or gangrene: Principal | ICD-10-CM | POA: Diagnosis present

## 2016-08-26 DIAGNOSIS — Z9049 Acquired absence of other specified parts of digestive tract: Secondary | ICD-10-CM | POA: Diagnosis not present

## 2016-08-26 DIAGNOSIS — Z7951 Long term (current) use of inhaled steroids: Secondary | ICD-10-CM | POA: Diagnosis not present

## 2016-08-26 DIAGNOSIS — K219 Gastro-esophageal reflux disease without esophagitis: Secondary | ICD-10-CM | POA: Diagnosis present

## 2016-08-26 DIAGNOSIS — Z79899 Other long term (current) drug therapy: Secondary | ICD-10-CM | POA: Diagnosis not present

## 2016-08-26 DIAGNOSIS — Z87891 Personal history of nicotine dependence: Secondary | ICD-10-CM | POA: Diagnosis not present

## 2016-08-26 DIAGNOSIS — N179 Acute kidney failure, unspecified: Secondary | ICD-10-CM | POA: Diagnosis not present

## 2016-08-26 DIAGNOSIS — T368X5A Adverse effect of other systemic antibiotics, initial encounter: Secondary | ICD-10-CM | POA: Diagnosis not present

## 2016-08-26 DIAGNOSIS — R0902 Hypoxemia: Secondary | ICD-10-CM | POA: Diagnosis not present

## 2016-08-26 DIAGNOSIS — R739 Hyperglycemia, unspecified: Secondary | ICD-10-CM | POA: Diagnosis not present

## 2016-08-26 DIAGNOSIS — K660 Peritoneal adhesions (postprocedural) (postinfection): Secondary | ICD-10-CM | POA: Diagnosis present

## 2016-08-26 DIAGNOSIS — J449 Chronic obstructive pulmonary disease, unspecified: Secondary | ICD-10-CM

## 2016-08-26 DIAGNOSIS — B961 Klebsiella pneumoniae [K. pneumoniae] as the cause of diseases classified elsewhere: Secondary | ICD-10-CM | POA: Diagnosis present

## 2016-08-26 DIAGNOSIS — Z8546 Personal history of malignant neoplasm of prostate: Secondary | ICD-10-CM

## 2016-08-26 DIAGNOSIS — J441 Chronic obstructive pulmonary disease with (acute) exacerbation: Secondary | ICD-10-CM | POA: Diagnosis not present

## 2016-08-26 DIAGNOSIS — K439 Ventral hernia without obstruction or gangrene: Secondary | ICD-10-CM | POA: Diagnosis present

## 2016-08-26 DIAGNOSIS — Z9079 Acquired absence of other genital organ(s): Secondary | ICD-10-CM

## 2016-08-26 DIAGNOSIS — I959 Hypotension, unspecified: Secondary | ICD-10-CM | POA: Diagnosis not present

## 2016-08-26 HISTORY — PX: VENTRAL HERNIA REPAIR: SHX424

## 2016-08-26 HISTORY — PX: INSERTION OF MESH: SHX5868

## 2016-08-26 LAB — TYPE AND SCREEN
ABO/RH(D): A POS
Antibody Screen: NEGATIVE

## 2016-08-26 SURGERY — REPAIR, HERNIA, VENTRAL
Anesthesia: General | Site: Abdomen

## 2016-08-26 MED ORDER — ONDANSETRON HCL 4 MG/2ML IJ SOLN
INTRAMUSCULAR | Status: AC
  Filled 2016-08-26: qty 2

## 2016-08-26 MED ORDER — DOCUSATE SODIUM 100 MG PO CAPS
100.0000 mg | ORAL_CAPSULE | Freq: Two times a day (BID) | ORAL | Status: DC
  Administered 2016-08-26 – 2016-09-01 (×11): 100 mg via ORAL
  Filled 2016-08-26 (×11): qty 1

## 2016-08-26 MED ORDER — FENTANYL CITRATE (PF) 100 MCG/2ML IJ SOLN
INTRAMUSCULAR | Status: AC
  Filled 2016-08-26: qty 2

## 2016-08-26 MED ORDER — ROCURONIUM BROMIDE 10 MG/ML (PF) SYRINGE
PREFILLED_SYRINGE | INTRAVENOUS | Status: AC
  Filled 2016-08-26: qty 10

## 2016-08-26 MED ORDER — MIDAZOLAM HCL 2 MG/2ML IJ SOLN
INTRAMUSCULAR | Status: AC
  Filled 2016-08-26: qty 2

## 2016-08-26 MED ORDER — IPRATROPIUM-ALBUTEROL 0.5-2.5 (3) MG/3ML IN SOLN
3.0000 mL | Freq: Four times a day (QID) | RESPIRATORY_TRACT | Status: DC | PRN
  Administered 2016-08-26: 3 mL via RESPIRATORY_TRACT
  Filled 2016-08-26: qty 3

## 2016-08-26 MED ORDER — MOMETASONE FURO-FORMOTEROL FUM 200-5 MCG/ACT IN AERO
2.0000 | INHALATION_SPRAY | Freq: Two times a day (BID) | RESPIRATORY_TRACT | Status: DC
  Administered 2016-08-27 – 2016-09-01 (×10): 2 via RESPIRATORY_TRACT
  Filled 2016-08-26 (×3): qty 8.8

## 2016-08-26 MED ORDER — CHLORHEXIDINE GLUCONATE CLOTH 2 % EX PADS: 6.0000 | MEDICATED_PAD | Freq: Once | CUTANEOUS | Status: DC

## 2016-08-26 MED ORDER — FENTANYL CITRATE (PF) 100 MCG/2ML IJ SOLN
25.0000 ug | INTRAMUSCULAR | Status: DC | PRN
  Administered 2016-08-26 (×3): 50 ug via INTRAVENOUS

## 2016-08-26 MED ORDER — HYDROMORPHONE 1 MG/ML IV SOLN
INTRAVENOUS | Status: AC
  Filled 2016-08-26: qty 25

## 2016-08-26 MED ORDER — LIDOCAINE 2% (20 MG/ML) 5 ML SYRINGE
INTRAMUSCULAR | Status: AC
  Filled 2016-08-26: qty 5

## 2016-08-26 MED ORDER — ROCURONIUM BROMIDE 100 MG/10ML IV SOLN
INTRAVENOUS | Status: DC | PRN
  Administered 2016-08-26: 50 mg via INTRAVENOUS
  Administered 2016-08-26 (×4): 20 mg via INTRAVENOUS
  Administered 2016-08-26: 10 mg via INTRAVENOUS

## 2016-08-26 MED ORDER — PANTOPRAZOLE SODIUM 40 MG PO PACK
40.0000 mg | PACK | Freq: Every day | ORAL | Status: DC
  Administered 2016-08-26 – 2016-09-01 (×7): 40 mg
  Filled 2016-08-26 (×7): qty 20

## 2016-08-26 MED ORDER — AMLODIPINE BESYLATE 5 MG PO TABS
5.0000 mg | ORAL_TABLET | Freq: Every day | ORAL | Status: DC
  Administered 2016-08-26 – 2016-08-27 (×2): 5 mg via ORAL
  Filled 2016-08-26 (×3): qty 1

## 2016-08-26 MED ORDER — ACETAMINOPHEN 500 MG PO TABS
1000.0000 mg | ORAL_TABLET | ORAL | Status: AC
  Administered 2016-08-26: 1000 mg via ORAL
  Filled 2016-08-26: qty 2

## 2016-08-26 MED ORDER — ACETAMINOPHEN 10 MG/ML IV SOLN
1000.0000 mg | Freq: Once | INTRAVENOUS | Status: AC
  Administered 2016-08-26: 1000 mg via INTRAVENOUS

## 2016-08-26 MED ORDER — HYDROMORPHONE 1 MG/ML IV SOLN
INTRAVENOUS | Status: DC
  Administered 2016-08-26: 3.2 mg via INTRAVENOUS
  Administered 2016-08-26: 12:00:00 via INTRAVENOUS
  Administered 2016-08-26 – 2016-08-27 (×2): 0.3 mg via INTRAVENOUS
  Administered 2016-08-27: 0.6 mg via INTRAVENOUS
  Administered 2016-08-27: 0.06 mg via INTRAVENOUS
  Administered 2016-08-27: 0.6 mg via INTRAVENOUS
  Administered 2016-08-27 (×2): 1.2 mg via INTRAVENOUS
  Administered 2016-08-28: 2.1 mg via INTRAVENOUS
  Administered 2016-08-28: 0.6 mg via INTRAVENOUS
  Administered 2016-08-28: 0 mg via INTRAVENOUS
  Administered 2016-08-28: 1.2 mg via INTRAVENOUS
  Administered 2016-08-28: 1.8 mg via INTRAVENOUS
  Administered 2016-08-28: 1.2 mg via INTRAVENOUS
  Administered 2016-08-29: 2.7 mg via INTRAVENOUS
  Administered 2016-08-29: 1.5 mg via INTRAVENOUS
  Administered 2016-08-29: 2.1 mg via INTRAVENOUS
  Administered 2016-08-29: 0.3 mg via INTRAVENOUS
  Administered 2016-08-29: 04:00:00 via INTRAVENOUS
  Administered 2016-08-29: 1.5 mg via INTRAVENOUS
  Administered 2016-08-29: 2.1 mg via INTRAVENOUS
  Administered 2016-08-30: 0.9 mg via INTRAVENOUS
  Administered 2016-08-30: 1.2 mg via INTRAVENOUS
  Administered 2016-08-30 (×2): 0.9 mg via INTRAVENOUS
  Administered 2016-08-30: 1.2 mg via INTRAVENOUS
  Administered 2016-08-30: 1.5 mg via INTRAVENOUS
  Administered 2016-08-31 (×2): 0.9 mg via INTRAVENOUS
  Filled 2016-08-26: qty 25

## 2016-08-26 MED ORDER — NALOXONE HCL 0.4 MG/ML IJ SOLN: 0.4000 mg | INTRAMUSCULAR | Status: DC | PRN

## 2016-08-26 MED ORDER — KCL IN DEXTROSE-NACL 10-5-0.45 MEQ/L-%-% IV SOLN
INTRAVENOUS | Status: DC
  Administered 2016-08-26 – 2016-08-27 (×2): via INTRAVENOUS
  Filled 2016-08-26 (×4): qty 1000

## 2016-08-26 MED ORDER — POLYETHYLENE GLYCOL 3350 17 G PO PACK: 17.0000 g | PACK | Freq: Every day | ORAL | Status: DC | PRN

## 2016-08-26 MED ORDER — GABAPENTIN 300 MG PO CAPS
300.0000 mg | ORAL_CAPSULE | ORAL | Status: AC
Start: 2016-08-26 — End: 2016-08-26
  Administered 2016-08-26: 300 mg via ORAL
  Filled 2016-08-26: qty 1

## 2016-08-26 MED ORDER — BISACODYL 5 MG PO TBEC
5.0000 mg | DELAYED_RELEASE_TABLET | Freq: Every day | ORAL | Status: DC | PRN
  Administered 2016-08-31: 5 mg via ORAL
  Filled 2016-08-26: qty 1

## 2016-08-26 MED ORDER — VANCOMYCIN HCL IN DEXTROSE 1-5 GM/200ML-% IV SOLN
1000.0000 mg | Freq: Two times a day (BID) | INTRAVENOUS | Status: AC
  Administered 2016-08-26: 1000 mg via INTRAVENOUS
  Filled 2016-08-26: qty 200

## 2016-08-26 MED ORDER — POLYMYXIN B SULFATE 500000 UNITS IJ SOLR
INTRAMUSCULAR | Status: DC | PRN
  Administered 2016-08-26: 500 mL

## 2016-08-26 MED ORDER — LISINOPRIL 20 MG PO TABS
20.0000 mg | ORAL_TABLET | Freq: Two times a day (BID) | ORAL | Status: DC
  Administered 2016-08-26 – 2016-08-27 (×2): 20 mg via ORAL
  Filled 2016-08-26 (×3): qty 1

## 2016-08-26 MED ORDER — FESOTERODINE FUMARATE ER 8 MG PO TB24: 8.0000 mg | ORAL_TABLET | Freq: Every day | ORAL | Status: DC

## 2016-08-26 MED ORDER — FENTANYL CITRATE (PF) 100 MCG/2ML IJ SOLN
INTRAMUSCULAR | Status: DC | PRN
  Administered 2016-08-26: 100 ug via INTRAVENOUS
  Administered 2016-08-26 (×2): 50 ug via INTRAVENOUS
  Administered 2016-08-26 (×2): 25 ug via INTRAVENOUS
  Administered 2016-08-26: 50 ug via INTRAVENOUS
  Administered 2016-08-26: 100 ug via INTRAVENOUS
  Administered 2016-08-26 (×2): 50 ug via INTRAVENOUS
  Administered 2016-08-26: 100 ug via INTRAVENOUS

## 2016-08-26 MED ORDER — VANCOMYCIN HCL IN DEXTROSE 1-5 GM/200ML-% IV SOLN
1000.0000 mg | INTRAVENOUS | Status: AC
  Administered 2016-08-26: 1000 mg via INTRAVENOUS
  Filled 2016-08-26: qty 200

## 2016-08-26 MED ORDER — BUPIVACAINE HCL (PF) 0.25 % IJ SOLN
INTRAMUSCULAR | Status: AC
  Filled 2016-08-26: qty 30

## 2016-08-26 MED ORDER — DIPHENHYDRAMINE HCL 50 MG/ML IJ SOLN: 12.5000 mg | Freq: Four times a day (QID) | INTRAMUSCULAR | Status: DC | PRN

## 2016-08-26 MED ORDER — ONDANSETRON HCL 4 MG/2ML IJ SOLN
4.0000 mg | Freq: Once | INTRAMUSCULAR | Status: AC | PRN
  Administered 2016-08-26: 4 mg via INTRAVENOUS

## 2016-08-26 MED ORDER — ESCITALOPRAM OXALATE 10 MG PO TABS
10.0000 mg | ORAL_TABLET | Freq: Every day | ORAL | Status: DC
  Administered 2016-08-26 – 2016-09-01 (×7): 10 mg via ORAL
  Filled 2016-08-26 (×7): qty 1

## 2016-08-26 MED ORDER — PROPOFOL 10 MG/ML IV BOLUS
INTRAVENOUS | Status: AC
  Filled 2016-08-26: qty 20

## 2016-08-26 MED ORDER — ENOXAPARIN SODIUM 30 MG/0.3ML ~~LOC~~ SOLN
30.0000 mg | SUBCUTANEOUS | Status: DC
  Administered 2016-08-27 – 2016-08-31 (×5): 30 mg via SUBCUTANEOUS
  Filled 2016-08-26 (×5): qty 0.3

## 2016-08-26 MED ORDER — BUPIVACAINE HCL 0.25 % IJ SOLN
INTRAMUSCULAR | Status: DC | PRN
  Administered 2016-08-26: 30 mL

## 2016-08-26 MED ORDER — LABETALOL HCL 5 MG/ML IV SOLN
INTRAVENOUS | Status: DC | PRN
  Administered 2016-08-26 (×2): 5 mg via INTRAVENOUS
  Administered 2016-08-26: 2.5 mg via INTRAVENOUS

## 2016-08-26 MED ORDER — PROPOFOL 10 MG/ML IV BOLUS
INTRAVENOUS | Status: DC | PRN
  Administered 2016-08-26: 150 mg via INTRAVENOUS

## 2016-08-26 MED ORDER — OXYMETAZOLINE HCL 0.05 % NA SOLN
NASAL | Status: DC | PRN
  Administered 2016-08-26 (×2): 2 via NASAL

## 2016-08-26 MED ORDER — ALBUTEROL SULFATE HFA 108 (90 BASE) MCG/ACT IN AERS: 2.0000 | INHALATION_SPRAY | RESPIRATORY_TRACT | Status: DC | PRN

## 2016-08-26 MED ORDER — OXYCODONE HCL 5 MG PO TABS: 5.0000 mg | ORAL_TABLET | Freq: Once | ORAL | Status: DC | PRN

## 2016-08-26 MED ORDER — SODIUM CHLORIDE 0.9 % IV SOLN: Freq: Once | INTRAVENOUS | Status: DC

## 2016-08-26 MED ORDER — OXYCODONE HCL 5 MG/5ML PO SOLN: 5.0000 mg | Freq: Once | ORAL | Status: DC | PRN

## 2016-08-26 MED ORDER — MIDAZOLAM HCL 5 MG/5ML IJ SOLN
INTRAMUSCULAR | Status: DC | PRN
  Administered 2016-08-26: 2 mg via INTRAVENOUS

## 2016-08-26 MED ORDER — LIDOCAINE HCL (CARDIAC) 20 MG/ML IV SOLN
INTRAVENOUS | Status: DC | PRN
  Administered 2016-08-26: 40 mg via INTRAVENOUS

## 2016-08-26 MED ORDER — POVIDONE-IODINE 10 % EX OINT
TOPICAL_OINTMENT | CUTANEOUS | Status: AC
  Filled 2016-08-26: qty 28.35

## 2016-08-26 MED ORDER — LACTATED RINGERS IV SOLN
INTRAVENOUS | Status: DC
  Administered 2016-08-26 (×3): via INTRAVENOUS

## 2016-08-26 MED ORDER — ACETAMINOPHEN 10 MG/ML IV SOLN
INTRAVENOUS | Status: AC
  Filled 2016-08-26: qty 100

## 2016-08-26 MED ORDER — 0.9 % SODIUM CHLORIDE (POUR BTL) OPTIME
TOPICAL | Status: DC | PRN
  Administered 2016-08-26: 1000 mL

## 2016-08-26 MED ORDER — FENTANYL CITRATE (PF) 100 MCG/2ML IJ SOLN
INTRAMUSCULAR | Status: AC
  Filled 2016-08-26: qty 4

## 2016-08-26 MED ORDER — OXYMETAZOLINE HCL 0.05 % NA SOLN
NASAL | Status: AC
  Filled 2016-08-26: qty 15

## 2016-08-26 MED ORDER — SODIUM CHLORIDE 0.9% FLUSH: 9.0000 mL | INTRAVENOUS | Status: DC | PRN

## 2016-08-26 MED ORDER — DEXAMETHASONE SODIUM PHOSPHATE 10 MG/ML IJ SOLN
INTRAMUSCULAR | Status: DC | PRN
  Administered 2016-08-26: 10 mg via INTRAVENOUS

## 2016-08-26 MED ORDER — ONDANSETRON HCL 4 MG/2ML IJ SOLN
4.0000 mg | Freq: Four times a day (QID) | INTRAMUSCULAR | Status: DC | PRN
  Administered 2016-08-26 (×2): 4 mg via INTRAVENOUS
  Filled 2016-08-26 (×2): qty 2

## 2016-08-26 MED ORDER — POVIDONE-IODINE 10 % EX OINT
TOPICAL_OINTMENT | CUTANEOUS | Status: DC | PRN
  Administered 2016-08-26: 1 via TOPICAL

## 2016-08-26 MED ORDER — DIPHENHYDRAMINE HCL 12.5 MG/5ML PO ELIX: 12.5000 mg | ORAL_SOLUTION | Freq: Four times a day (QID) | ORAL | Status: DC | PRN

## 2016-08-26 MED ORDER — SUGAMMADEX SODIUM 200 MG/2ML IV SOLN
INTRAVENOUS | Status: DC | PRN
  Administered 2016-08-26: 200 mg via INTRAVENOUS

## 2016-08-26 MED ORDER — ONDANSETRON HCL 4 MG/2ML IJ SOLN
INTRAMUSCULAR | Status: DC | PRN
  Administered 2016-08-26 (×2): 4 mg via INTRAVENOUS

## 2016-08-26 MED ORDER — IPRATROPIUM-ALBUTEROL 0.5-2.5 (3) MG/3ML IN SOLN: 3.0000 mL | Freq: Three times a day (TID) | RESPIRATORY_TRACT | Status: DC

## 2016-08-26 MED ORDER — CEFAZOLIN SODIUM-DEXTROSE 2-4 GM/100ML-% IV SOLN
2.0000 g | INTRAVENOUS | Status: DC
  Filled 2016-08-26: qty 100

## 2016-08-26 SURGICAL SUPPLY — 65 items
BAG DECANTER FOR FLEXI CONT (MISCELLANEOUS) ×4 IMPLANT
BINDER ABD UNIV 10 28-50 (GAUZE/BANDAGES/DRESSINGS) IMPLANT
BINDER ABDOM UNIV 10 (GAUZE/BANDAGES/DRESSINGS) ×4
BINDER ABDOMINAL 12 ML 46-62 (SOFTGOODS) ×2 IMPLANT
BIOPATCH RED 1 DISK 7.0 (GAUZE/BANDAGES/DRESSINGS) ×2 IMPLANT
BIOPATCH RED 1IN DISK 7.0MM (GAUZE/BANDAGES/DRESSINGS) ×2
BLADE SURG ROTATE 9660 (MISCELLANEOUS) ×2 IMPLANT
CANISTER SUCTION 2500CC (MISCELLANEOUS) ×4 IMPLANT
CHLORAPREP W/TINT 26ML (MISCELLANEOUS) ×4 IMPLANT
COVER SURGICAL LIGHT HANDLE (MISCELLANEOUS) ×4 IMPLANT
DRAIN CHANNEL 19F RND (DRAIN) ×4 IMPLANT
DRAPE LAPAROSCOPIC ABDOMINAL (DRAPES) ×4 IMPLANT
DRAPE UTILITY XL STRL (DRAPES) ×4 IMPLANT
ELECT BLADE 4.0 EZ CLEAN MEGAD (MISCELLANEOUS) ×4
ELECT CAUTERY BLADE 6.4 (BLADE) ×4 IMPLANT
ELECT REM PT RETURN 9FT ADLT (ELECTROSURGICAL) ×4
ELECTRODE BLDE 4.0 EZ CLN MEGD (MISCELLANEOUS) IMPLANT
ELECTRODE REM PT RTRN 9FT ADLT (ELECTROSURGICAL) ×2 IMPLANT
EVACUATOR SILICONE 100CC (DRAIN) ×4 IMPLANT
GAUZE SPONGE 4X4 12PLY STRL (GAUZE/BANDAGES/DRESSINGS) ×2 IMPLANT
GLOVE BIO SURGEON STRL SZ8 (GLOVE) ×4 IMPLANT
GLOVE BIOGEL PI IND STRL 7.0 (GLOVE) IMPLANT
GLOVE BIOGEL PI IND STRL 8 (GLOVE) ×2 IMPLANT
GLOVE BIOGEL PI INDICATOR 7.0 (GLOVE) ×2
GLOVE BIOGEL PI INDICATOR 8 (GLOVE) ×8
GLOVE ECLIPSE 7.5 STRL STRAW (GLOVE) ×6 IMPLANT
GLOVE SURG SS PI 6.5 STRL IVOR (GLOVE) ×4 IMPLANT
GLOVE SURG SS PI 8.0 STRL IVOR (GLOVE) ×2 IMPLANT
GOWN STRL REUS W/ TWL LRG LVL3 (GOWN DISPOSABLE) ×6 IMPLANT
GOWN STRL REUS W/ TWL XL LVL3 (GOWN DISPOSABLE) IMPLANT
GOWN STRL REUS W/TWL LRG LVL3 (GOWN DISPOSABLE) ×20
GOWN STRL REUS W/TWL XL LVL3 (GOWN DISPOSABLE) ×12
KIT BASIN OR (CUSTOM PROCEDURE TRAY) ×4 IMPLANT
KIT ROOM TURNOVER OR (KITS) ×4 IMPLANT
MESH PHASIX ST 20CMX25CM (Tissue Mesh) ×2 IMPLANT
NDL HYPO 25GX1X1/2 BEV (NEEDLE) ×2 IMPLANT
NEEDLE HYPO 25GX1X1/2 BEV (NEEDLE) ×4 IMPLANT
NS IRRIG 1000ML POUR BTL (IV SOLUTION) ×4 IMPLANT
PACK GENERAL/GYN (CUSTOM PROCEDURE TRAY) ×4 IMPLANT
PAD ABD 8X10 STRL (GAUZE/BANDAGES/DRESSINGS) ×4 IMPLANT
PAD ARMBOARD 7.5X6 YLW CONV (MISCELLANEOUS) ×4 IMPLANT
RETAINER VISCERA MED (MISCELLANEOUS) ×2 IMPLANT
SPONGE GAUZE 4X4 12PLY STER LF (GAUZE/BANDAGES/DRESSINGS) ×2 IMPLANT
SPONGE LAP 18X18 X RAY DECT (DISPOSABLE) ×8 IMPLANT
STAPLER VISISTAT 35W (STAPLE) IMPLANT
SUT ETHIBOND NAB CTX #1 30IN (SUTURE) IMPLANT
SUT ETHILON 2 0 FS 18 (SUTURE) ×4 IMPLANT
SUT MNCRL AB 3-0 PS2 18 (SUTURE) ×2 IMPLANT
SUT NOVA 0 T20/GS 25DT 4464 63 (SUTURE) ×4 IMPLANT
SUT NOVA 1 T20/GS 25DT (SUTURE) ×8 IMPLANT
SUT PDS AB 0 CT 36 (SUTURE) ×34 IMPLANT
SUT PROLENE 1 CT (SUTURE) IMPLANT
SUT SILK 2 0 (SUTURE) ×4
SUT SILK 2-0 18XBRD TIE 12 (SUTURE) ×2 IMPLANT
SUT VIC AB 2-0 CT1 27 (SUTURE)
SUT VIC AB 2-0 CT1 TAPERPNT 27 (SUTURE) IMPLANT
SUT VIC AB 2-0 SH 18 (SUTURE) ×4 IMPLANT
SUT VIC AB 3-0 CT1 27 (SUTURE)
SUT VIC AB 3-0 CT1 TAPERPNT 27 (SUTURE) IMPLANT
SUT VICRYL AB 3 0 TIES (SUTURE) IMPLANT
SYR CONTROL 10ML LL (SYRINGE) ×2 IMPLANT
TISSUE MATRIX STRATTICE 10X16 (Tissue) ×2 IMPLANT
TOWEL OR 17X24 6PK STRL BLUE (TOWEL DISPOSABLE) ×2 IMPLANT
TOWEL OR 17X26 10 PK STRL BLUE (TOWEL DISPOSABLE) ×4 IMPLANT
TRAY FOLEY CATH 14FRSI W/METER (CATHETERS) ×2 IMPLANT

## 2016-08-26 NOTE — Transfer of Care (Signed)
Immediate Anesthesia Transfer of Care Note  Patient: Michael Ellison  Procedure(s) Performed: Procedure(s): VENTRAL INCISIONAL HERNIA REPAIR WITH MESH (N/A) INSERTION OF MESH (N/A)  Patient Location: PACU  Anesthesia Type:General  Level of Consciousness: awake, alert , oriented and patient cooperative  Airway & Oxygen Therapy: Patient Spontanous Breathing and Patient connected to nasal cannula oxygen  Post-op Assessment: Report given to RN and Post -op Vital signs reviewed and stable  Post vital signs: Reviewed and stable  Last Vitals:  Vitals:   08/26/16 0700 08/26/16 1218  BP: 133/87 (!) 143/95  Pulse: 83 79  Resp: 20 19  Temp: 36.8 C     Last Pain:  Vitals:   08/26/16 0700  TempSrc: Oral      Patients Stated Pain Goal: 3 ([id number])  Complications: No apparent anesthesia complications

## 2016-08-26 NOTE — Progress Notes (Signed)
Dr Linna Caprice at bedside for ongoing pain despite medication pain 10/10. Precedex drip running per verbal order of 13ml over 10 minutes. Will continue to monitor.

## 2016-08-26 NOTE — H&P (Addendum)
Michael Ellison is an 70 y.o. male.   Chief Complaint: Incisional hernia,  HPI: Started back in 2013, emergency surgery with bowel resection, postoperative dehiscence,   Past Medical History:  Diagnosis Date  . Arthritis   . Aspiration pneumonia ([place]) 03/01/2012  . Asthma   . Cancer ([place])   . Cancer of prostate ([place])   . COPD (chronic obstructive pulmonary disease) ([facility])    home O2   PRN    . Depression   . GERD (gastroesophageal reflux disease)   . Hernia of abdominal cavity   . Hypertension   . On home O2    3L prn  . Shortness of breath     Past Surgical History:  Procedure Laterality Date  . BOWEL RESECTION  02/25/2012   Procedure: SMALL BOWEL RESECTION;  Surgeon: Donato Heinz, MD;  Location: [facility];  Service: General;;  . COLONOSCOPY N/A 05/27/2016   Procedure: COLONOSCOPY;  Surgeon: Daneil Dolin, MD;  Location: [facility];  Service: Endoscopy;  Laterality: N/A;  200  . HERNIA REPAIR    . INCISION AND DRAINAGE ABSCESS N/A 01/15/2013   Procedure: INCISION AND DRAINAGE Abdominal Wall ABSCESS;  Surgeon: Gwenyth Ober, MD;  Location: [place];  Service: General;  Laterality: N/A;  . INSERTION OF MESH N/A 12/25/2012   Procedure: INSERTION OF MESH;  Surgeon: Gwenyth Ober, MD;  Location: [place];  Service: General;  Laterality: N/A;  . LAPAROTOMY  02/25/2012   Procedure: EXPLORATORY LAPAROTOMY;  Surgeon: Donato Heinz, MD;  Location: [facility];  Service: General;  Laterality: N/A;  . POLYPECTOMY  05/27/2016   Procedure: POLYPECTOMY;  Surgeon: Daneil Dolin, MD;  [facility];  Service: Endoscopy;;  Descending colon polyp and Sigmoid colon polyp removed via hot snare  . PROSTATE BIOPSY    . robotic prostate surgery  2011  . TRACHEOSTOMY  03/2012  . VENTRAL HERNIA REPAIR  12/25/2012    Dr Hulen Skains  . VENTRAL HERNIA REPAIR N/A 12/25/2012   Procedure: HERNIA REPAIR VENTRAL ADULT;  Surgeon: Gwenyth Ober, MD;  Location: [place] [facility];  Service: General;  Laterality: N/A;  . WOUND  DEBRIDEMENT  03/02/2012   Procedure: DEBRIDEMENT CLOSURE/ABDOMINAL WOUND;  Surgeon: Gwenyth Ober, MD;  Location: [place];  Service: General;  Laterality: N/A;  . ZENKER'S DIVERTICULECTOMY  2013   Dr. Redmond Baseman    Family History  Problem Relation Age of Onset  . Pseudochol deficiency Neg Hx   . Anesthesia problems Neg Hx   . Hypotension Neg Hx   . Malignant hyperthermia Neg Hx   . Colon cancer Neg Hx    Social History:  reports that he quit smoking about 4 years ago. His smoking use included Cigarettes. He has a 66.00 pack-year smoking history. He has never used smokeless tobacco. He reports that he does not drink alcohol or use drugs.  Allergies:  Allergies  Allergen Reactions  . Lorazepam Other (See Comments)    SEVERE DELERIUM AGITATION Tolerates midazolam and other benzo's    Medications Prior to Admission  Medication Sig Dispense Refill  . albuterol (PROVENTIL HFA;VENTOLIN HFA) 108 (90 Base) MCG/ACT inhaler Inhale 2 puffs into the lungs every 4 (four) hours as needed for wheezing or shortness of breath. 1 Inhaler 0  . amLODipine (NORVASC) 5 MG tablet Take 5 mg by mouth daily.    Marland Kitchen aspirin EC 81 MG tablet Take 81 mg by mouth once a week.    . budesonide-formoterol (SYMBICORT)  160-4.5 MCG/ACT inhaler Inhale 2 puffs into the lungs 2 (two) times daily. 1 Inhaler 12  . escitalopram (LEXAPRO) 10 MG tablet Take 10 mg by mouth daily.    . fesoterodine (TOVIAZ) 8 MG TB24 tablet Take 8 mg by mouth.    . hydrochlorothiazide (HYDRODIURIL) 25 MG tablet Take 25 mg by mouth daily.    Marland Kitchen ibuprofen (ADVIL,MOTRIN) 200 MG tablet Take 600 mg by mouth every 6 (six) hours as needed for mild pain.    Marland Kitchen ipratropium-albuterol (DUONEB) 0.5-2.5 (3) MG/3ML SOLN Take 3 mLs by nebulization every 6 (six) hours as needed (for shortness of breath/wheezing).     Marland Kitchen lisinopril (PRINIVIL,ZESTRIL) 20 MG tablet Take 20 mg by mouth 2 (two) times daily.     . polyethylene glycol (MIRALAX / GLYCOLAX) packet Take 17 g by  mouth daily as needed for mild constipation.    . traMADol-acetaminophen (ULTRACET) 37.5-325 MG per tablet Take 1 tablet by mouth 2 (two) times daily as needed for moderate pain or severe pain.     . polyethylene glycol-electrolytes (TRILYTE) 420 g solution Take 4,000 mLs by mouth as directed. (Patient not taking: Reported on 08/19/2016) 4000 mL 0    No results found for this or any previous visit (from the past 48 hour(s)). No results found.  Review of Systems  Constitutional: Negative for chills and fever.  Respiratory: Negative for cough, shortness of breath (No shortness of breath) and wheezing.        Has not needed his oxygen at home.  Pulmonary has cleared him in the sense that his pulmonary function has been optimized and risk understood  Cardiovascular: Negative for chest pain.  Gastrointestinal: Negative for abdominal pain.    Blood pressure 133/87, pulse 83, temperature 98.3 F (36.8 C), temperature source Oral, resp. rate 20, height 6' 2.5" (1.892 m), weight 93 kg (205 lb), SpO2 98 %. Physical Exam  Nursing note and vitals reviewed. Constitutional: He is oriented to person, place, and time. He appears well-developed and well-nourished.  HENT:  Head: Normocephalic and atraumatic.  Eyes: Conjunctivae and EOM are normal. Pupils are equal, round, and reactive to light.  Neck: Normal range of motion. Neck supple.  Cardiovascular: Normal rate, regular rhythm and normal heart sounds.   No murmurs  Respiratory: Effort normal and breath sounds normal. He has no wheezes. He has no rales. He exhibits no tenderness.  GI: Soft. Bowel sounds are normal. He exhibits no mass. There is tenderness (at the midline hernia site). There is no rebound and no guarding.  Large midline hernias at multiple sites from the top to bottom  Genitourinary: Penis normal.  Musculoskeletal: Normal range of motion.  Neurological: He is alert and oriented to person, place, and time. He has normal reflexes.   Skin: Skin is warm and dry.  Psychiatric: He has a normal mood and affect. His behavior is normal. Judgment and thought content normal.     Assessment/Plan Large incisional ventral hernia with history of previous failed repair, needs component separation and probably TAR procedure with mesh. High risk procedure because of pulmonary function and previous infection..  Will plan on at least stepdown unit admission postoperatively.  Preoperative antibiotics.   Judeth Horn, MD 08/26/2016, 7:05 AM

## 2016-08-26 NOTE — Anesthesia Preprocedure Evaluation (Addendum)
Anesthesia Evaluation  Patient identified by MRN, date of birth, ID band Patient awake    Reviewed: Allergy & Precautions, NPO status , Patient's Chart, lab work & pertinent test results  Airway Mallampati: II  TM Distance: >3 FB Neck ROM: Full    Dental  (+) Teeth Intact, Dental Advisory Given   Pulmonary former smoker,    breath sounds clear to auscultation       Cardiovascular hypertension,  Rhythm:Regular Rate:Normal     Neuro/Psych    GI/Hepatic   Endo/Other    Renal/GU      Musculoskeletal   Abdominal   Peds  Hematology   Anesthesia Other Findings   Reproductive/Obstetrics                            Anesthesia Physical Anesthesia Plan  ASA: III  Anesthesia Plan: General   Post-op Pain Management:    Induction: Intravenous  Airway Management Planned: Oral ETT  Additional Equipment:   Intra-op Plan:   Post-operative Plan: Possible Post-op intubation/ventilation  Informed Consent: I have reviewed the patients History and Physical, chart, labs and discussed the procedure including the risks, benefits and alternatives for the proposed anesthesia with the patient or authorized representative who has indicated his/her understanding and acceptance.   Dental advisory given  Plan Discussed with: CRNA and Anesthesiologist  Anesthesia Plan Comments:        Anesthesia Quick Evaluation

## 2016-08-26 NOTE — Anesthesia Postprocedure Evaluation (Signed)
Anesthesia Post Note  Patient: [name]  Procedure(s) Performed: Procedure(s) (LRB): VENTRAL INCISIONAL HERNIA REPAIR WITH MESH (N/A) INSERTION OF MESH (N/A)  Patient location during evaluation: PACU Anesthesia Type: General Level of consciousness: awake, awake and alert and oriented Pain management: pain level controlled Vital Signs Assessment: post-procedure vital signs reviewed and stable Respiratory status: spontaneous breathing, nonlabored ventilation and respiratory function stable Cardiovascular status: blood pressure returned to baseline Postop Assessment: no headache Anesthetic complications: no    Last Vitals:  Vitals:   08/26/16 1415 08/26/16 1420  BP: 109/81 90/67  Pulse: 72 72  Resp: 14 15  Temp:      Last Pain:  Vitals:   08/26/16 1400  TempSrc:   PainSc: Asleep                 Marieme Mcmackin COKER

## 2016-08-26 NOTE — Op Note (Signed)
OPERATIVE REPORT  DATE OF OPERATION: 08/26/2016  PATIENT:  Michael Ellison  70 y.o. male  PRE-OPERATIVE DIAGNOSIS:  INCISIONAL VENTRAL HERNIA  POST-OPERATIVE DIAGNOSIS:  INCISIONAL VENTRAL HERNIA  INDICATION(S) FOR OPERATION:  Symptomatic incisional hernia  FINDINGS:  Major multiple defects in the midline  PROCEDURE:  Procedure(s): VENTRAL INCISIONAL HERNIA REPAIR WITH MESH INSERTION OF MESH  SURGEON:  Surgeon(s): Erroll Luna, MD Judeth Horn, MD  ASSISTANT: Brantley Stage, M.D.  ANESTHESIA:   general  COMPLICATIONS:  None  EBL: 250 ml  BLOOD ADMINISTERED: none  DRAINS: Urinary Catheter (Foley) and (2) Blake drain(s) in the Retrorectus area   SPECIMEN:  No Specimen  COUNTS CORRECT:  YES  PROCEDURE DETAILS: The patient was taken to the operating room and placed on the table in supine position. After an adequate general endotracheal anesthetic was administered he was prepped and draped in usual sterile manner exposing his abdomen.  A proper timeout was performed identifying the patient and procedure to be performed. A midline incision was made from the xiphoid down to the pubic crest. It was taken down through the midline subcutaneous tissue using electrocautery and care was taken not to injure multiple loops of small bowel that were adherent to the anterior abdominal wall. A significant amount of time--an hour and a half--was spent taking down adhesions between the omentum, small bowel, and the anterior abdominal wall. This was done safely without any injury to bowel or serosal tears.  Once we had the adhesions taken down completely to the lateral walls beyond the peritoneal reflections, and the peritoneal reflection we opened up the posterior rectus sheath just a centimeter lateral to the edge of the rectus muscle on both sides. We subsequently dissected down in the retro-rectus plane laterally to the linea semi-lunaris where the transversus abdominis muscle was cut allowing for  release of the posterior sheath medially. This was done on both sides; however, because the patient's tissues were very tenuous there was a tear of the peritoneum laterally on the left side which had to be repaired using a 15 x 10 cm piece of porcine biologic prosthetic.  The biologic prosthetic was sutured in place using 0 PDS suture. Once the fascia was released on both sides we reapproximated the midline using 0 PDS suture interrupted sutures. We subsequently placed a 20 x 25 cm piece of coated Phasix mesh attached it circumferentially using the 0 PDS suture. This along with the biologic prosthetic allowed [place] to reapproximate the anterior rectus muscle towards the midline using interrupted #1 Novafil sutures. Two 19 Fr Blake drains were placed in the retrorectus position. It was sutured in place with 2-0 Nylon. Once the fascia was closed anteriorly on top of the mesh the skin was closeduUsing stainless steel staples.  All Needle Counts, Sponge Counts, and Instrument Counts Were Correct.  PATIENT DISPOSITION:  PACU - guarded condition.   Aja Whitehair 11/9/201712:12 PM

## 2016-08-27 ENCOUNTER — Encounter (HOSPITAL_COMMUNITY): Payer: Self-pay | Admitting: General Surgery

## 2016-08-27 ENCOUNTER — Inpatient Hospital Stay (HOSPITAL_COMMUNITY): Payer: Medicare Other

## 2016-08-27 LAB — BASIC METABOLIC PANEL
ANION GAP: 11 (ref 5–15)
BUN: 29 mg/dL — ABNORMAL HIGH (ref 6–20)
CALCIUM: 8.2 mg/dL — AB (ref 8.9–10.3)
CHLORIDE: 102 mmol/L (ref 101–111)
CO2: 23 mmol/L (ref 22–32)
CREATININE: 1.71 mg/dL — AB (ref 0.61–1.24)
GFR calc non Af Amer: 39 mL/min — ABNORMAL LOW (ref >60)
GFR, EST AFRICAN AMERICAN: 45 mL/min — AB (ref >60)
Glucose, Bld: 180 mg/dL — ABNORMAL HIGH (ref 65–99)
Potassium: 4 mmol/L (ref 3.5–5.1)
SODIUM: 136 mmol/L (ref 135–145)

## 2016-08-27 LAB — CBC
HEMATOCRIT: 45.5 % (ref 39.0–52.0)
HEMOGLOBIN: 15.3 g/dL (ref 13.0–17.0)
MCH: 27.8 pg (ref 26.0–34.0)
MCHC: 33.6 g/dL (ref 30.0–36.0)
MCV: 82.7 fL (ref 78.0–100.0)
Platelets: 242 10*3/uL (ref 150–400)
RBC: 5.5 MIL/uL (ref 4.22–5.81)
RDW: 14.5 % (ref 11.5–15.5)
WBC: 21.5 10*3/uL — AB (ref 4.0–10.5)

## 2016-08-27 MED ORDER — PROMETHAZINE HCL 25 MG/ML IJ SOLN
12.5000 mg | INTRAMUSCULAR | Status: DC | PRN
  Filled 2016-08-27 (×2): qty 1

## 2016-08-27 MED ORDER — ALBUTEROL (5 MG/ML) CONTINUOUS INHALATION SOLN
10.0000 mg/h | INHALATION_SOLUTION | RESPIRATORY_TRACT | Status: DC
  Administered 2016-08-27: 10 mg/h via RESPIRATORY_TRACT
  Filled 2016-08-27: qty 20

## 2016-08-27 MED ORDER — FUROSEMIDE 10 MG/ML IJ SOLN
20.0000 mg | Freq: Once | INTRAMUSCULAR | Status: AC
Start: 2016-08-27 — End: 2016-08-27
  Administered 2016-08-27: 20 mg via INTRAVENOUS
  Filled 2016-08-27: qty 2

## 2016-08-27 MED ORDER — ACETAMINOPHEN 10 MG/ML IV SOLN
1000.0000 mg | Freq: Four times a day (QID) | INTRAVENOUS | Status: AC
  Administered 2016-08-27 – 2016-08-28 (×3): 1000 mg via INTRAVENOUS
  Filled 2016-08-27 (×4): qty 100

## 2016-08-27 MED ORDER — ONDANSETRON HCL 4 MG/2ML IJ SOLN
4.0000 mg | INTRAMUSCULAR | Status: DC | PRN
  Administered 2016-08-27: 4 mg via INTRAVENOUS
  Filled 2016-08-27: qty 2

## 2016-08-27 MED ORDER — IPRATROPIUM-ALBUTEROL 0.5-2.5 (3) MG/3ML IN SOLN
3.0000 mL | Freq: Three times a day (TID) | RESPIRATORY_TRACT | Status: DC
  Administered 2016-08-27 – 2016-09-01 (×14): 3 mL via RESPIRATORY_TRACT
  Filled 2016-08-27 (×14): qty 3

## 2016-08-27 MED ORDER — IPRATROPIUM-ALBUTEROL 0.5-2.5 (3) MG/3ML IN SOLN
3.0000 mL | RESPIRATORY_TRACT | Status: DC
  Administered 2016-08-27 (×2): 3 mL via RESPIRATORY_TRACT
  Filled 2016-08-27 (×2): qty 3

## 2016-08-27 NOTE — Progress Notes (Signed)
@  [facility]od[facility].Slates- MD on-call for Dr. Asa Lente. Georgette Dover- paged regarding: (1) increased WOB (bilateral wheezing with crackles in pt's lungs, 3L Home O2, RR 20s) and pt's persistent nausea despite administration of Zofran.   @0148  Page returned. IVF decreased to 70mL/hr, Neb now per RT, and Zofran to Q4 from Q6  @0320  Dr. Georgette Dover re-paged at request of RT for STAT chest xray to assess for pulmonary edema (pt respiratory status only marginally improved after neb).  @0322  Page returned and STAT chest x-ray ordered.  @0405  Dr.Tsuei re-paged regarding results of chest x-ray and pt's vomiting/nausea despite additional Zofran administration.  @0408  Page returned. IVFs to [facility], IV Lasix administered, and Phenergan ordered and administered for unrelieved nausea.  Late Entry: @0500  Pt WOB is greatly improved and pt resting comfortably. Pt presently denies nausea or shortness of breath. Abdominal dressing with only marginal shadowing and minor distention. Will continue to monitor and will update on-coming shift of pt's nightly course.

## 2016-08-27 NOTE — Progress Notes (Signed)
GS Progress Note Subjective: Patient had some trouble with nausea and vomiting last night along with mild hypoxemia.  He seems better this AM  Objective: Vital signs in last 24 hours: Temp:  [96.9 F (36.1 C)-98.6 F (37 C)] 98.6 F (37 C) (11/10 0729) Pulse Rate:  [72-104] 100 (11/10 0729) Resp:  [12-26] 25 (11/10 0729) BP: (90-144)/(67-107) 114/85 (11/10 0729) SpO2:  [91 %-100 %] 94 % (11/10 0729) Weight:  [94.9 kg (209 lb 3.5 oz)] 94.9 kg (209 lb 3.5 oz) (11/09 1523)    Intake/Output from previous day: 11/09 0701 - 11/10 0700 In: 3356.3 [P.O.:60; I.V.:3096.3; IV Piggyback:200] Out: 1625 [Urine:960; Drains:340; Blood:325] Intake/Output this shift: No intake/output data recorded.  Lungs: Oxygen saturations 95-98% on 4L by [place].  No wheezing.  CXR shows diminished lung capacity in the bases.  No infiltrates or evidence of pneumonia  Abd: Has some bowel sounds on the left.  Binder off shows some fullness on the left side.  Tender.  Extremities: No clincal signs or symptoms of DVT  Neuro: Intact  Lab Results: CBC   Recent Labs  08/27/16 0452  WBC 21.5*  HGB 15.3  HCT 45.5  PLT 242   BMET  Recent Labs  08/27/16 0452  NA 136  K 4.0  CL 102  CO2 23  GLUCOSE 180*  BUN 29*  CREATININE 1.71*  CALCIUM 8.2*   PT/INR No results for input(s): LABPROT, INR in the last 72 hours. ABG No results for input(s): PHART, HCO3 in the last 72 hours.  Invalid input(s): PCO2, PO2  Studies/Results: Dg Chest Port 1 View  Result Date: 08/27/2016 CLINICAL DATA:  Increasing shortness of breath tonight EXAM: PORTABLE CHEST 1 VIEW COMPARISON:  08/20/2016 FINDINGS: Shallow inspiration with linear atelectasis in the lung bases. Cardiac enlargement. No pulmonary vascular congestion. Emphysematous changes in the lungs. No focal airspace disease or consolidation. Slight blunting of left costophrenic angle may indicate a small pleural effusion. Tortuous aorta with calcification.  Degenerative changes in the spine and shoulders. IMPRESSION: Shallow inspiration with linear atelectasis in the lung bases. Mild cardiac enlargement. Emphysematous changes. Small left pleural effusion. No focal consolidation. Electronically Signed   By: Lucienne Capers M.D.   On: 08/27/2016 03:40    Anti-infectives: Anti-infectives    Start     Dose/Rate Route Frequency Ordered Stop   08/26/16 1530  vancomycin (VANCOCIN) IVPB 1000 mg/200 mL premix     1,000 mg 200 mL/hr over 60 Minutes Intravenous Every 12 hours 08/26/16 1526 08/26/16 1820   08/26/16 1112  polymyxin B 500,000 Units, bacitracin 50,000 Units in sodium chloride irrigation 0.9 % 500 mL irrigation  Status:  Discontinued       As needed 08/26/16 1112 08/26/16 1214   08/26/16 0815  vancomycin (VANCOCIN) IVPB 1000 mg/200 mL premix     1,000 mg 200 mL/hr over 60 Minutes Intravenous To Surgery 08/26/16 0806 08/26/16 0804   08/26/16 0702  ceFAZolin (ANCEF) IVPB 2g/100 mL premix  Status:  Discontinued     2 g 200 mL/hr over 30 Minutes Intravenous On call to O.R. 08/26/16 0702 08/26/16 1514      Assessment/Plan: s/p Procedure(s): VENTRAL INCISIONAL HERNIA REPAIR WITH MESH INSERTION OF MESH Continue foley due to strict I&O and urinary output monitoring Mobilize to a chair.  LOS: 1 day    Kathryne Eriksson. Dahlia Bailiff, MD, FACS [phone] [facility] 08/27/2016

## 2016-08-28 LAB — CREATININE, URINE, RANDOM: CREATININE, URINE: 276.94 mg/dL

## 2016-08-28 LAB — BASIC METABOLIC PANEL WITH GFR
Anion gap: 9 (ref 5–15)
BUN: 54 mg/dL — ABNORMAL HIGH (ref 6–20)
CO2: 26 mmol/L (ref 22–32)
Calcium: 8.3 mg/dL — ABNORMAL LOW (ref 8.9–10.3)
Chloride: 102 mmol/L (ref 101–111)
Creatinine, Ser: 2.79 mg/dL — ABNORMAL HIGH (ref 0.61–1.24)
GFR calc Af Amer: 25 mL/min — ABNORMAL LOW
GFR calc non Af Amer: 22 mL/min — ABNORMAL LOW
Glucose, Bld: 121 mg/dL — ABNORMAL HIGH (ref 65–99)
Potassium: 5.1 mmol/L (ref 3.5–5.1)
Sodium: 137 mmol/L (ref 135–145)

## 2016-08-28 LAB — CBC
HEMATOCRIT: 42.8 % (ref 39.0–52.0)
Hemoglobin: 14.2 g/dL (ref 13.0–17.0)
MCH: 27.4 pg (ref 26.0–34.0)
MCHC: 33.2 g/dL (ref 30.0–36.0)
MCV: 82.6 fL (ref 78.0–100.0)
Platelets: 232 10*3/uL (ref 150–400)
RBC: 5.18 MIL/uL (ref 4.22–5.81)
RDW: 14.8 % (ref 11.5–15.5)
WBC: 20.4 10*3/uL — ABNORMAL HIGH (ref 4.0–10.5)

## 2016-08-28 LAB — URINALYSIS, ROUTINE W REFLEX MICROSCOPIC
Glucose, UA: NEGATIVE mg/dL
Hgb urine dipstick: NEGATIVE
Ketones, ur: NEGATIVE mg/dL
Leukocytes, UA: NEGATIVE
Nitrite: NEGATIVE
Protein, ur: NEGATIVE mg/dL
Specific Gravity, Urine: 1.02 (ref 1.005–1.030)
pH: 5 (ref 5.0–8.0)

## 2016-08-28 LAB — SODIUM, URINE, RANDOM

## 2016-08-28 MED ORDER — SODIUM CHLORIDE 0.9 % IV BOLUS (SEPSIS)
500.0000 mL | Freq: Once | INTRAVENOUS | Status: AC
  Administered 2016-08-28: 500 mL via INTRAVENOUS

## 2016-08-28 MED ORDER — DEXTROSE-NACL 5-0.45 % IV SOLN
INTRAVENOUS | Status: DC
  Administered 2016-08-28: 10 mL/h via INTRAVENOUS

## 2016-08-28 NOTE — Progress Notes (Signed)
GS Progress Note Subjective: Patient sitting up in chair, does not seem to be in distress.  Oxygen saturations in the high 80's.    Objective: Vital signs in last 24 hours: Temp:  [97.8 F (36.6 C)-98.7 F (37.1 C)] 97.8 F (36.6 C) (11/11 0800) Pulse Rate:  [89-99] 95 (11/11 0800) Resp:  [16-27] 16 (11/11 0838) BP: (92-121)/(60-83) 102/68 (11/11 0800) SpO2:  [87 %-96 %] 92 % (11/11 0838) Last BM Date: 08/25/16  Intake/Output from previous day: 11/10 0701 - 11/11 0700 In: 414.3 [I.V.:114.3; IV Piggyback:300] Out: 915 [Urine:725; Drains:190] Intake/Output this shift: No intake/output data recorded.  Lungs: Diminished globally.  Oxygen saturations are low.  Will get CXR  Abd: Wound is covered.  Hernia reapir seems intact  Extremities: No changes  Neuro: Intact  Lab Results: CBC   Recent Labs  08/27/16 0452 08/28/16 0315  WBC 21.5* 20.4*  HGB 15.3 14.2  HCT 45.5 42.8  PLT 242 232   BMET  Recent Labs  08/27/16 0452 08/28/16 0315  NA 136 137  K 4.0 5.1  CL 102 102  CO2 23 26  GLUCOSE 180* 121*  BUN 29* 54*  CREATININE 1.71* 2.79*  CALCIUM 8.2* 8.3*   PT/INR No results for input(s): LABPROT, INR in the last 72 hours. ABG No results for input(s): PHART, HCO3 in the last 72 hours.  Invalid input(s): PCO2, PO2  Studies/Results: Dg Chest Port 1 View  Result Date: 08/27/2016 CLINICAL DATA:  Increasing shortness of breath tonight EXAM: PORTABLE CHEST 1 VIEW COMPARISON:  08/20/2016 FINDINGS: Shallow inspiration with linear atelectasis in the lung bases. Cardiac enlargement. No pulmonary vascular congestion. Emphysematous changes in the lungs. No focal airspace disease or consolidation. Slight blunting of left costophrenic angle may indicate a small pleural effusion. Tortuous aorta with calcification. Degenerative changes in the spine and shoulders. IMPRESSION: Shallow inspiration with linear atelectasis in the lung bases. Mild cardiac enlargement. Emphysematous  changes. Small left pleural effusion. No focal consolidation. Electronically Signed   By: Lucienne Capers M.D.   On: 08/27/2016 03:40    Anti-infectives: Anti-infectives    Start     Dose/Rate Route Frequency Ordered Stop   08/26/16 1530  vancomycin (VANCOCIN) IVPB 1000 mg/200 mL premix     1,000 mg 200 mL/hr over 60 Minutes Intravenous Every 12 hours 08/26/16 1526 08/26/16 1820   08/26/16 1112  polymyxin B 500,000 Units, bacitracin 50,000 Units in sodium chloride irrigation 0.9 % 500 mL irrigation  Status:  Discontinued       As needed 08/26/16 1112 08/26/16 1214   08/26/16 0815  vancomycin (VANCOCIN) IVPB 1000 mg/200 mL premix     1,000 mg 200 mL/hr over 60 Minutes Intravenous To Surgery 08/26/16 0806 08/26/16 0804   08/26/16 0702  ceFAZolin (ANCEF) IVPB 2g/100 mL premix  Status:  Discontinued     2 g 200 mL/hr over 30 Minutes Intravenous On call to O.R. 08/26/16 0702 08/26/16 1514      Assessment/Plan: s/p Procedure(s): VENTRAL INCISIONAL HERNIA REPAIR WITH MESH INSERTION OF MESH d/c foley May need to replace Foley to monitor urine output  Renal insufficiency .  I have asked the renal service to see this patient.  Fluid are running at minimal rate because the patient has lung issues. Will allow the patient to start clear liquids.  LOS: 2 days    Kathryne Eriksson. Dahlia Bailiff, MD, FACS [phone] [facility] 08/28/2016

## 2016-08-28 NOTE — Consult Note (Signed)
Reason for Consult: AKI Referring Physician: [name] is an 70 y.o. male with history of COPD (previously on home O2 but not recently), recurrent abdominal hernias, HTN, prostate cancer s/p prostatectomy, and previous tobacco abuse. Baseline SCr appears to be ~ 0.9. Has had volume responsive AKI in the past x 2 (2013, 2014).  HPI: Patient presented for incisional ventral hernia repair, which was performed on 08/26/16. SCr has increased from 1.09 on 11/3 (pre-op anesthesiology assessment) to 1.7 11/10 to 2.79 11/11. Since surgery, he has had shortness of breath requiring up to 4L O2 by [place]. Overnight Thursday (11/9), there was concern for pulmonary edema, so 20 mg IV lasix given 11/10. Decreased UOP 725 mL yesterday, but patient does not appear to have received IVFs beyond KVO fluids during this hospitalization. To start clear liquid diet today per surgery. Patient says he had been taking both ibuprofen and aleve recently but had stopped a few days prior to surgery. Home BP medications are norvasc, HTCZ, and lisinopril. He received two 20 mg doses of lisinopril while hospitalized, last given a.m. of 11/10. He has had nausea with vomiting, as well. No diarrhea. He says a nurse had commented that his urine was dark, but he did not see this for himself. He denies previous issues with his kidneys.   Past Medical History:  Diagnosis Date  . Arthritis   . Aspiration pneumonia ([place]) 03/01/2012  . Asthma   . Cancer ([place])   . Cancer of prostate ([place])   . COPD (chronic obstructive pulmonary disease) ([facility])    home O2   PRN    . Depression   . GERD (gastroesophageal reflux disease)   . Hernia of abdominal cavity   . Hypertension   . On home O2    3L prn  . Shortness of breath     Past Surgical History:  Procedure Laterality Date  . BOWEL RESECTION  02/25/2012   Procedure: SMALL BOWEL RESECTION;  Surgeon: Donato Heinz, MD;  Location: [facility];  Service: General;;  . COLONOSCOPY N/A  05/27/2016   Procedure: COLONOSCOPY;  Surgeon: Daneil Dolin, MD;  Location[facility];  Service: Endoscopy;  Laterality: N/A;  200  . HERNIA REPAIR    . INCISION AND DRAINAGE ABSCESS N/A 01/15/2013   Procedure: INCISION AND DRAINAGE Abdominal Wall ABSCESS;  Surgeon: Gwenyth Ober, MD;  Location: [place];  Service: General;  Laterality: N/A;  . INSERTION OF MESH N/A 12/25/2012   Procedure: INSERTION OF MESH;  Surgeon: Gwenyth Ober, MD;  Location: [place];  Service: General;  Laterality: N/A;  . INSERTION OF MESH N/A 08/26/2016   Procedure: INSERTION OF MESH;  Surgeon: Judeth Horn, MD;  Location: [place];  Service: General;  Laterality: N/A;  . LAPAROTOMY  02/25/2012   Procedure: EXPLORATORY LAPAROTOMY;  Surgeon: Donato Heinz, MD;  Location: [facility];  Service: General;  Laterality: N/A;  . POLYPECTOMY  05/27/2016   Procedure: POLYPECTOMY;  Surgeon: Daneil Dolin, MD;  [facility];  Service: Endoscopy;;  Descending colon polyp and Sigmoid colon polyp removed via hot snare  . PROSTATE BIOPSY    . robotic prostate surgery  2011  . TRACHEOSTOMY  03/2012  . VENTRAL HERNIA REPAIR  12/25/2012    Dr Hulen Skains  . VENTRAL HERNIA REPAIR N/A 12/25/2012   Procedure: HERNIA REPAIR VENTRAL ADULT;  Surgeon: Gwenyth Ober, MD;  Location: [place];  Service: General;  Laterality: N/A;  . VENTRAL  HERNIA REPAIR N/A 08/26/2016   Procedure: VENTRAL INCISIONAL HERNIA REPAIR WITH MESH;  Surgeon: Judeth Horn, MD;  Location: [place];  Service: General;  Laterality: N/A;  . WOUND DEBRIDEMENT  03/02/2012   Procedure: DEBRIDEMENT CLOSURE/ABDOMINAL WOUND;  Surgeon: Gwenyth Ober, MD;  Location: [place];  Service: General;  Laterality: N/A;  . ZENKER'S DIVERTICULECTOMY  2013   Dr. Redmond Baseman    Family History  Problem Relation Age of Onset  . Pseudochol deficiency Neg Hx   . Anesthesia problems Neg Hx   . Hypotension Neg Hx   . Malignant hyperthermia Neg Hx   . Colon cancer Neg Hx     Social History:  reports that he quit  smoking about 4 years ago. His smoking use included Cigarettes. He has a 66.00 pack-year smoking history. He has never used smokeless tobacco. He reports that he does not drink alcohol or use drugs.  Allergies:  Allergies  Allergen Reactions  . Lorazepam Other (See Comments)    SEVERE DELERIUM AGITATION Tolerates midazolam and other benzo's    Medications:  Scheduled: . amLODipine  5 mg Oral Daily  . docusate sodium  100 mg Oral BID  . enoxaparin (LOVENOX) injection  30 mg Subcutaneous Q24H  . escitalopram  10 mg Oral Daily  . HYDROmorphone   Intravenous Q4H  . ipratropium-albuterol  3 mL Nebulization TID  . mometasone-formoterol  2 puff Inhalation BID  . pantoprazole sodium  40 mg Per Tube Daily    Results for orders placed or performed during the hospital encounter of 08/26/16 (from the past 48 hour(s))  CBC     Status: Abnormal   Collection Time: 08/27/16  4:52 AM  Result Value Ref Range   WBC 21.5 (H) 4.0 - 10.5 K/uL   RBC 5.50 4.22 - 5.81 MIL/uL   Hemoglobin 15.3 13.0 - 17.0 g/dL   HCT 45.5 39.0 - 52.0 %   MCV 82.7 78.0 - 100.0 fL   MCH 27.8 26.0 - 34.0 pg   MCHC 33.6 30.0 - 36.0 g/dL   RDW 14.5 11.5 - 15.5 %   Platelets 242 150 - 400 K/uL  Basic metabolic panel     Status: Abnormal   Collection Time: 08/27/16  4:52 AM  Result Value Ref Range   Sodium 136 135 - 145 mmol/L   Potassium 4.0 3.5 - 5.1 mmol/L   Chloride 102 101 - 111 mmol/L   CO2 23 22 - 32 mmol/L   Glucose, Bld 180 (H) 65 - 99 mg/dL   BUN 29 (H) 6 - 20 mg/dL   Creatinine, Ser 1.71 (H) 0.61 - 1.24 mg/dL   Calcium 8.2 (L) 8.9 - 10.3 mg/dL   GFR calc non Af Amer 39 (L) >60 mL/min   GFR calc Af Amer 45 (L) >60 mL/min    Comment: (NOTE) The eGFR has been calculated using the CKD EPI equation. This calculation has not been validated in all clinical situations. eGFR's persistently <60 mL/min signify possible Chronic Kidney Disease.    Anion gap 11 5 - 15  CBC     Status: Abnormal   Collection Time:  08/28/16  3:15 AM  Result Value Ref Range   WBC 20.4 (H) 4.0 - 10.5 K/uL   RBC 5.18 4.22 - 5.81 MIL/uL   Hemoglobin 14.2 13.0 - 17.0 g/dL   HCT 42.8 39.0 - 52.0 %   MCV 82.6 78.0 - 100.0 fL   MCH 27.4 26.0 - 34.0 pg   MCHC 33.2 30.0 -  36.0 g/dL   RDW 14.8 11.5 - 15.5 %   Platelets 232 150 - 400 K/uL  Basic metabolic panel     Status: Abnormal   Collection Time: 08/28/16  3:15 AM  Result Value Ref Range   Sodium 137 135 - 145 mmol/L   Potassium 5.1 3.5 - 5.1 mmol/L    Comment: DELTA CHECK NOTED   Chloride 102 101 - 111 mmol/L   CO2 26 22 - 32 mmol/L   Glucose, Bld 121 (H) 65 - 99 mg/dL   BUN 54 (H) 6 - 20 mg/dL   Creatinine, Ser 2.79 (H) 0.61 - 1.24 mg/dL    Comment: DELTA CHECK NOTED   Calcium 8.3 (L) 8.9 - 10.3 mg/dL   GFR calc non Af Amer 22 (L) >60 mL/min   GFR calc Af Amer 25 (L) >60 mL/min    Comment: (NOTE) The eGFR has been calculated using the CKD EPI equation. This calculation has not been validated in all clinical situations. eGFR's persistently <60 mL/min signify possible Chronic Kidney Disease.    Anion gap 9 5 - 15    Dg Chest Port 1 View  Result Date: 08/27/2016 CLINICAL DATA:  Increasing shortness of breath tonight EXAM: PORTABLE CHEST 1 VIEW COMPARISON:  08/20/2016 FINDINGS: Shallow inspiration with linear atelectasis in the lung bases. Cardiac enlargement. No pulmonary vascular congestion. Emphysematous changes in the lungs. No focal airspace disease or consolidation. Slight blunting of left costophrenic angle may indicate a small pleural effusion. Tortuous aorta with calcification. Degenerative changes in the spine and shoulders. IMPRESSION: Shallow inspiration with linear atelectasis in the lung bases. Mild cardiac enlargement. Emphysematous changes. Small left pleural effusion. No focal consolidation. Electronically Signed   By: Lucienne Capers M.D.   On: 08/27/2016 03:40   ROS: Positive for dyspnea on exertion, nausea and vomiting. No abdominal pain at  present (on PCA). No chest pain.  Blood pressure 102/68, pulse 95, temperature 97.8 F (36.6 C), temperature source Oral, resp. rate 16, height 6' 2.5" (1.892 m), weight 209 lb 3.5 oz (94.9 kg), SpO2 92 %. Physical Exam  Constitutional: He is oriented to person, place, and time. He appears well-developed and well-nourished. No distress.  HENT:  Head: Normocephalic and atraumatic.  Mouth/Throat: Oropharynx is clear and moist.  Neck: No JVD present.  Cardiovascular: Normal rate, regular rhythm and normal heart sounds.  Exam reveals no gallop and no friction rub.   No murmur heard. Respiratory: Effort normal and breath sounds normal. No respiratory distress. He has no wheezes. He exhibits no tenderness.  GI: He exhibits distension. There is no tenderness. There is no rebound and no guarding.  2 abdominal bulb drains present  Musculoskeletal: He exhibits no edema or tenderness.  Neurological: He is alert and oriented to person, place, and time.  Skin: Skin is warm and dry.  Abdominal binder in place. Wound site clean and dry.  Psychiatric: He has a normal mood and affect. His behavior is normal. Thought content normal.   Assessment/Plan:  AKI: Suspect pre-renal in setting of diuresis, vomiting, and no fluids x 2 days and could have component of ATN with patient's low blood pressures (SBPs in the 90s 11/9-11/10) and recent use of NSAIDs (PTA)  and lisinopril (just stopped yesterday) No prior history of kidney disease. Will obtain UA to look for protein/casts, as well as urine sodium, and creatinine. Avoid nephrotoxic drugs, including home lisinopril, and limit diuresis as able. Will give NaCl bolus 500 cc x 2 today. Patient now  also taking PO. Continue to monitor strict I/Os and SCr.   Volume status: Appears euvolemic but is +1.2 L since admission with some uncharted UOP. Wt + 3 lbs from pre-op appointment to now. Obtain daily weights, strict I/Os.   Elevated blood glucose: Glucose to 180 11/10.  Could be acute stress response but will obtain hgb A1c.   S/p abdominal hernia repair: Has not yet had a BM. Does not know if he has passed gas. On dilaudid PCA for pain.   HTN: Currently hypotensive. Would hold antihypertensives at this time. Will give fluid bolus.   COPD: Continue supplemental O2 prn for spO2 < 88%.   Darci Needle, MD [place], PGY-2 08/28/2016, 11:37 AM   I have seen and examined this patient and agree with plan and assessment in the above note with renal recommendations/intervention highlighted. Pt with normal renal function at baseline, s/p hernia repair. Limited fluids since surgery although does appear rec'd 3 L on 11/9) (d/t concerns about pulm status), hypotension (ongoing, currently in the 80's, with associated dizziness and lightheadedness), and  rec'd lisinopril through yesterday. Most likely pre-renal/hemodynamic AKI. All BP meds stopped, will give 500cc fluid bolus now and if tolerated, repeat another bolus and reassess. K mildly up at 5.1 from 4.0 yesterday and will need to watch closely.   Makenzi Bannister B,MD 08/28/2016 1:38 PM

## 2016-08-28 NOTE — Progress Notes (Signed)
Dr. Lorrene Reid text paged that 262ml of urine returned after foley insertion.

## 2016-08-29 ENCOUNTER — Inpatient Hospital Stay (HOSPITAL_COMMUNITY): Payer: Medicare Other

## 2016-08-29 LAB — RENAL FUNCTION PANEL
Albumin: 2.9 g/dL — ABNORMAL LOW (ref 3.5–5.0)
Anion gap: 7 (ref 5–15)
BUN: 63 mg/dL — ABNORMAL HIGH (ref 6–20)
CALCIUM: 8.2 mg/dL — AB (ref 8.9–10.3)
CO2: 26 mmol/L (ref 22–32)
CREATININE: 2.06 mg/dL — AB (ref 0.61–1.24)
Chloride: 103 mmol/L (ref 101–111)
GFR calc non Af Amer: 31 mL/min — ABNORMAL LOW (ref >60)
GFR, EST AFRICAN AMERICAN: 36 mL/min — AB (ref >60)
Glucose, Bld: 111 mg/dL — ABNORMAL HIGH (ref 65–99)
Phosphorus: 2.9 mg/dL (ref 2.5–4.6)
Potassium: 5.1 mmol/L (ref 3.5–5.1)
SODIUM: 136 mmol/L (ref 135–145)

## 2016-08-29 LAB — CBC WITH DIFFERENTIAL/PLATELET
BASOS ABS: 0 10*3/uL (ref 0.0–0.1)
Basophils Relative: 0 %
EOS ABS: 0 10*3/uL (ref 0.0–0.7)
EOS PCT: 0 %
HCT: 39.4 % (ref 39.0–52.0)
Hemoglobin: 13.1 g/dL (ref 13.0–17.0)
Lymphocytes Relative: 9 %
Lymphs Abs: 1.4 10*3/uL (ref 0.7–4.0)
MCH: 27.6 pg (ref 26.0–34.0)
MCHC: 33.2 g/dL (ref 30.0–36.0)
MCV: 82.9 fL (ref 78.0–100.0)
Monocytes Absolute: 1.4 10*3/uL — ABNORMAL HIGH (ref 0.1–1.0)
Monocytes Relative: 9 %
Neutro Abs: 13.3 10*3/uL — ABNORMAL HIGH (ref 1.7–7.7)
Neutrophils Relative %: 82 %
PLATELETS: 227 10*3/uL (ref 150–400)
RBC: 4.75 MIL/uL (ref 4.22–5.81)
RDW: 15.2 % (ref 11.5–15.5)
WBC: 16.2 10*3/uL — AB (ref 4.0–10.5)

## 2016-08-29 LAB — EXPECTORATED SPUTUM ASSESSMENT W REFEX TO RESP CULTURE: SPECIAL REQUESTS: NORMAL

## 2016-08-29 MED ORDER — SODIUM CHLORIDE 0.9 % IV SOLN
Freq: Once | INTRAVENOUS | Status: AC
  Administered 2016-08-29: 50 mL/h via INTRAVENOUS

## 2016-08-29 NOTE — Progress Notes (Signed)
Michael Ellison Progress Note     Subjective:   Patient doing well today. Sitting up in the chair; 4 L of oxygen in place. Foley placed yesterday 200 cc in bladder Tolerating clears - I think not all recorded Good UOP   Objective:   BP (!) 135/97 (BP Location: Left Arm)   Pulse (!) 111   Temp 97.6 F (36.4 C) (Oral)   Resp 16   Ht 6' 2.5" (1.892 m)   Wt 207 lb 14.3 oz (94.3 kg)   SpO2 91%   BMI 26.33 kg/m   Intake/Output Summary (Last 24 hours) at 08/29/16 1030 Last data filed at 08/29/16 [id number]  Gross per 24 hour  Intake              890 ml  Output             1615 ml  Net             -725 ml   Weight change:   Physical Exam: [id number] gentlemen, sitting up in chair, NAD  CVS:RRR, no murmurs  Resp:CTAB, no increased WOB [id number] binder in place, 2 abdominal bulb drains present Ext:No lower extremity edema   Imaging: Dg Chest Port 1 View  Result Date: 08/29/2016 CLINICAL DATA:  Patient status post hernia repair with mesh. EXAM: PORTABLE CHEST 1 VIEW COMPARISON:  Chest radiograph 08/27/2016. FINDINGS: Monitoring leads overlie the patient. Low lung volumes. Stable cardiac and mediastinal contours. Unchanged small left pleural effusion and heterogeneous opacities left lung base. No pneumothorax. Extensive bullous changes within the left upper hemi thorax. IMPRESSION: Low lung volumes with small left pleural effusion and underlying atelectasis. Emphysematous changes. Electronically Signed   By: Lovey Newcomer M.D.   On: 08/29/2016 07:43     Recent Labs Lab 08/27/16 0452 08/28/16 0315 08/29/16 0319  NA 136 137 136  K 4.0 5.1 5.1  CL 102 102 103  CO2 23 26 26   GLUCOSE 180* 121* 111*  BUN 29* 54* 63*  CREATININE 1.71* 2.79* 2.06*  CALCIUM 8.2* 8.3* 8.2*  PHOS  --   --  2.9     Recent Labs Lab 08/27/16 0452 08/28/16 0315 08/29/16 0319  WBC 21.5* 20.4* 16.2*  NEUTROABS  --   --  13.3*  HGB 15.3 14.2 13.1  HCT 45.5 42.8 39.4  MCV 82.7 82.6  82.9  PLT 242 232 227    Medications:    . docusate sodium  100 mg Oral BID  . enoxaparin (LOVENOX) injection  30 mg Subcutaneous Q24H  . escitalopram  10 mg Oral Daily  . HYDROmorphone   Intravenous Q4H  . ipratropium-albuterol  3 mL Nebulization TID  . mometasone-formoterol  2 puff Inhalation BID  . pantoprazole sodium  40 mg Per Tube Daily    Background: 70 y.o. male with history of COPD (previously on home O2 but not recently), recurrent abdominal hernias, HTN, prostate cancer s/p prostatectomy, and previous tobacco abuse presenting for incisional ventral hernia repair, performed on 11/9. Baseline SCr appears to be ~ 0.9. Post op increase to 2.79 with oliguria. Was hypotensive with symptoms (80's), had been receiving lisinopril. Low FeNa c/w pre-renal picture. In addition 200 cc in bladder when foley placed. Renal function improved with fluid resuscitation.   Assessment/ Plan:    AKI (No prior history of kidney disease): Pre-renal in setting of diuresis, vomiting, and no fluids x 2 days and low blood pressures, diagnosis supported by low FENa. UA without protein/RBCs and no casts  noted, therefore unlikely prerenal further developing into ATN. Possible some degree of urinary retention, with 200 cc in bladder when foley placed  Creatinine falling and UOP improved with volume administration and improvement in BP. - NS @ 50 ml per hour for the next 24 hours; will re-evaluate tomorrow   - SCr 2.79> 2.06, UOP 1.135; Euvolemic on exam - Continue to monitor strict I/Os and SCr.   Elevated blood glucose: Glucose to 180 11/10. Could be acute stress response but will obtain hgb A1c. (pending)  S/p abdominal hernia repair: Has not yet had a BM. Does not know if he has passed gas. On dilaudid PCA for pain.   HTN: HYPOtensive yesterday - holding lisinopril.   COPD: Continue supplemental O2 prn for spO2 < 88%.   Kerrin Mo, MD 08/29/2016, 10:30 AM   I have seen and examined this  patient and agree with plan and assessment in the above note with renal recommendations/intervention highlighted. Creatinine falling and UOP improved with IVF/normalization of BP. Pre renal is main issue here, supported by low FeNa. Continue slow fluids for another 24 hours until assured good po (tells me drinking fluids, not documented and weight down today). Keep off lisinopril.  Keiran Sias B,MD 08/29/2016 1:40 PM

## 2016-08-29 NOTE — Progress Notes (Signed)
Small amount of bright red drainage noted to be leaking around the insertion site of drain #1.  Also, a few drops of blood noted at the bottom of the incision.  Area reinforced with ABD pad and taped in place.

## 2016-08-29 NOTE — Progress Notes (Signed)
GS Progress Note Subjective: Patient a bit depressed today.  Otherwise appears to be doing okay.  Objective: Vital signs in last 24 hours: Temp:  [97.6 F (36.4 C)-98.7 F (37.1 C)] 97.6 F (36.4 C) (11/12 0804) Pulse Rate:  [93-116] 111 (11/12 0804) Resp:  [16-34] 16 (11/12 0804) BP: (91-135)/(66-97) 135/97 (11/12 0804) SpO2:  [90 %-97 %] 91 % (11/12 0825) Weight:  [94.3 kg (207 lb 14.3 oz)] 94.3 kg (207 lb 14.3 oz) (11/12 0342) Last BM Date: 08/25/16  Intake/Output from previous day: 11/11 0701 - 11/12 0700 In: 770 [P.O.:600; I.V.:170] Out: 1135 [Urine:1000; Drains:135] Intake/Output this shift: Total I/O In: 120 [P.O.:120] Out: 480 [Urine:450; Drains:30]  Lungs: Much clearer, but has some thick sputum.  Oxygenation much better today.  Abd: Soft, Wound with a little bit of bloody drainage.  Extremities: No changes.  No DVT signs or symptoms  Neuro: Intact  Lab Results: CBC   Recent Labs  08/28/16 0315 08/29/16 0319  WBC 20.4* 16.2*  HGB 14.2 13.1  HCT 42.8 39.4  PLT 232 227   BMET  Recent Labs  08/28/16 0315 08/29/16 0319  NA 137 136  K 5.1 5.1  CL 102 103  CO2 26 26  GLUCOSE 121* 111*  BUN 54* 63*  CREATININE 2.79* 2.06*  CALCIUM 8.3* 8.2*   PT/INR No results for input(s): LABPROT, INR in the last 72 hours. ABG No results for input(s): PHART, HCO3 in the last 72 hours.  Invalid input(s): PCO2, PO2  Studies/Results: Dg Chest Port 1 View  Result Date: 08/29/2016 CLINICAL DATA:  Patient status post hernia repair with mesh. EXAM: PORTABLE CHEST 1 VIEW COMPARISON:  Chest radiograph 08/27/2016. FINDINGS: Monitoring leads overlie the patient. Low lung volumes. Stable cardiac and mediastinal contours. Unchanged small left pleural effusion and heterogeneous opacities left lung base. No pneumothorax. Extensive bullous changes within the left upper hemi thorax. IMPRESSION: Low lung volumes with small left pleural effusion and underlying atelectasis.  Emphysematous changes. Electronically Signed   By: Lovey Newcomer M.D.   On: 08/29/2016 07:43    Anti-infectives: Anti-infectives    Start     Dose/Rate Route Frequency Ordered Stop   08/26/16 1530  vancomycin (VANCOCIN) IVPB 1000 mg/200 mL premix     1,000 mg 200 mL/hr over 60 Minutes Intravenous Every 12 hours 08/26/16 1526 08/26/16 1820   08/26/16 1112  polymyxin B 500,000 Units, bacitracin 50,000 Units in sodium chloride irrigation 0.9 % 500 mL irrigation  Status:  Discontinued       As needed 08/26/16 1112 08/26/16 1214   08/26/16 0815  vancomycin (VANCOCIN) IVPB 1000 mg/200 mL premix     1,000 mg 200 mL/hr over 60 Minutes Intravenous To Surgery 08/26/16 0806 08/26/16 0804   08/26/16 0702  ceFAZolin (ANCEF) IVPB 2g/100 mL premix  Status:  Discontinued     2 g 200 mL/hr over 30 Minutes Intravenous On call to O.R. 08/26/16 0702 08/26/16 1514      Assessment/Plan: s/p Procedure(s): VENTRAL INCISIONAL HERNIA REPAIR WITH MESH INSERTION OF MESH Advance diet Continue foley due to acute urinary retention, strict I&O and urinary output monitoring Full liquid diet.  LOS: 3 days    Kathryne Eriksson. Dahlia Bailiff, MD, FACS [phone] [facility] 08/29/2016

## 2016-08-30 LAB — CBC WITH DIFFERENTIAL/PLATELET
BASOS ABS: 0 10*3/uL (ref 0.0–0.1)
BASOS PCT: 0 %
Eosinophils Absolute: 0.1 10*3/uL (ref 0.0–0.7)
Eosinophils Relative: 0 %
HEMATOCRIT: 38.3 % — AB (ref 39.0–52.0)
HEMOGLOBIN: 12.7 g/dL — AB (ref 13.0–17.0)
Lymphocytes Relative: 10 %
Lymphs Abs: 1.3 10*3/uL (ref 0.7–4.0)
MCH: 27.8 pg (ref 26.0–34.0)
MCHC: 33.2 g/dL (ref 30.0–36.0)
MCV: 83.8 fL (ref 78.0–100.0)
Monocytes Absolute: 1.4 10*3/uL — ABNORMAL HIGH (ref 0.1–1.0)
Monocytes Relative: 11 %
NEUTROS ABS: 9.9 10*3/uL — AB (ref 1.7–7.7)
NEUTROS PCT: 79 %
Platelets: 233 10*3/uL (ref 150–400)
RBC: 4.57 MIL/uL (ref 4.22–5.81)
RDW: 14.6 % (ref 11.5–15.5)
WBC: 12.7 10*3/uL — ABNORMAL HIGH (ref 4.0–10.5)

## 2016-08-30 LAB — RENAL FUNCTION PANEL
ALBUMIN: 2.8 g/dL — AB (ref 3.5–5.0)
ANION GAP: 9 (ref 5–15)
BUN: 33 mg/dL — AB (ref 6–20)
CALCIUM: 8.3 mg/dL — AB (ref 8.9–10.3)
CO2: 23 mmol/L (ref 22–32)
Chloride: 104 mmol/L (ref 101–111)
Creatinine, Ser: 1.04 mg/dL (ref 0.61–1.24)
GFR calc Af Amer: >60 mL/min (ref >60)
Glucose, Bld: 99 mg/dL (ref 65–99)
PHOSPHORUS: 2.7 mg/dL (ref 2.5–4.6)
POTASSIUM: 4.7 mmol/L (ref 3.5–5.1)
SODIUM: 136 mmol/L (ref 135–145)

## 2016-08-30 LAB — GLUCOSE, CAPILLARY: GLUCOSE-CAPILLARY: 89 mg/dL (ref 65–99)

## 2016-08-30 LAB — HEMOGLOBIN A1C
HEMOGLOBIN A1C: 5.5 % (ref 4.8–5.6)
MEAN PLASMA GLUCOSE: 111 mg/dL

## 2016-08-30 MED ORDER — TAMSULOSIN HCL 0.4 MG PO CAPS
0.4000 mg | ORAL_CAPSULE | Freq: Every day | ORAL | Status: DC
  Administered 2016-08-30 – 2016-09-01 (×3): 0.4 mg via ORAL
  Filled 2016-08-30 (×3): qty 1

## 2016-08-30 MED ORDER — ACETAMINOPHEN 500 MG PO TABS
1000.0000 mg | ORAL_TABLET | Freq: Three times a day (TID) | ORAL | Status: DC
  Administered 2016-08-30 – 2016-09-01 (×5): 1000 mg via ORAL
  Filled 2016-08-30 (×5): qty 2

## 2016-08-30 MED ORDER — SODIUM CHLORIDE 0.9 % IV SOLN
Freq: Once | INTRAVENOUS | Status: AC
  Administered 2016-08-30: 12:00:00 via INTRAVENOUS

## 2016-08-30 MED ORDER — OXYCODONE HCL 5 MG PO TABS
5.0000 mg | ORAL_TABLET | ORAL | Status: DC | PRN
  Administered 2016-08-31 – 2016-09-01 (×5): 10 mg via ORAL
  Filled 2016-08-30 (×5): qty 2

## 2016-08-30 NOTE — Progress Notes (Signed)
GS Progress Note Subjective: Patient sitting up in chair today, looks great.    Objective: Vital signs in last 24 hours: Temp:  [97.6 F (36.4 C)-98.6 F (37 C)] 98.5 F (36.9 C) (11/13 0700) Pulse Rate:  [48-114] 105 (11/13 0700) Resp:  [16-22] 18 (11/13 0730) BP: (107-145)/(71-118) 107/71 (11/13 0700) SpO2:  [91 %-98 %] 96 % (11/13 0925) Weight:  [92.9 kg (204 lb 11.2 oz)] 92.9 kg (204 lb 11.2 oz) (11/13 0314) Last BM Date: 08/25/16  Intake/Output from previous day: 11/12 0701 - 11/13 0700 In: 2840 [P.O.:2040] Out: 2050 [Urine:1925; Drains:125] Intake/Output this shift: Total I/O In: -  Out: 425 [Urine:400; Drains:25]  Lungs: Clear with occasional coughing.  IS up to 1250.    Abd: Soft, good bowel sounds.  Extremities: No clinical signs or symptoms of DVT  Neuro: Intact  Lab Results: CBC   Recent Labs  08/29/16 0319 08/30/16 0435  WBC 16.2* 12.7*  HGB 13.1 12.7*  HCT 39.4 38.3*  PLT 227 233   BMET  Recent Labs  08/29/16 0319 08/30/16 0435  NA 136 136  K 5.1 4.7  CL 103 104  CO2 26 23  GLUCOSE 111* 99  BUN 63* 33*  CREATININE 2.06* 1.04  CALCIUM 8.2* 8.3*   PT/INR No results for input(s): LABPROT, INR in the last 72 hours. ABG No results for input(s): PHART, HCO3 in the last 72 hours.  Invalid input(s): PCO2, PO2  Studies/Results: Dg Chest Port 1 View  Result Date: 08/29/2016 CLINICAL DATA:  Patient status post hernia repair with mesh. EXAM: PORTABLE CHEST 1 VIEW COMPARISON:  Chest radiograph 08/27/2016. FINDINGS: Monitoring leads overlie the patient. Low lung volumes. Stable cardiac and mediastinal contours. Unchanged small left pleural effusion and heterogeneous opacities left lung base. No pneumothorax. Extensive bullous changes within the left upper hemi thorax. IMPRESSION: Low lung volumes with small left pleural effusion and underlying atelectasis. Emphysematous changes. Electronically Signed   By: Lovey Newcomer M.D.   On: 08/29/2016 07:43     Anti-infectives: Anti-infectives    Start     Dose/Rate Route Frequency Ordered Stop   08/26/16 1530  vancomycin (VANCOCIN) IVPB 1000 mg/200 mL premix     1,000 mg 200 mL/hr over 60 Minutes Intravenous Every 12 hours 08/26/16 1526 08/26/16 1820   08/26/16 1112  polymyxin B 500,000 Units, bacitracin 50,000 Units in sodium chloride irrigation 0.9 % 500 mL irrigation  Status:  Discontinued       As needed 08/26/16 1112 08/26/16 1214   08/26/16 0815  vancomycin (VANCOCIN) IVPB 1000 mg/200 mL premix     1,000 mg 200 mL/hr over 60 Minutes Intravenous To Surgery 08/26/16 0806 08/26/16 0804   08/26/16 0702  ceFAZolin (ANCEF) IVPB 2g/100 mL premix  Status:  Discontinued     2 g 200 mL/hr over 30 Minutes Intravenous On call to O.R. 08/26/16 0702 08/26/16 1514      Assessment/Plan: s/p Procedure(s): VENTRAL INCISIONAL HERNIA REPAIR WITH MESH INSERTION OF MESH Advance diet Continue foley due to acute urinary retention and strict I&O Transfer to floor with telemetry.  LOS: 4 days    Kathryne Eriksson. Dahlia Bailiff, MD, FACS [phone] [facility] 08/30/2016

## 2016-08-30 NOTE — Progress Notes (Signed)
Report was called to [place], RN on [place] and will be transferring shortly.

## 2016-08-30 NOTE — Care Management Note (Addendum)
Case Management Note  Patient Details  Name: Michael Ellison MRN: [id number] Date of Birth: 1946/03/08  Subjective/Objective:  S/p Hernia repair with mesh.  Patient is up in chair.  He states he lives alone and pta indep, he has home oxygen with [facility] ,on 3 liters at home.  He has medication coverage and he has a pcp and he has transportation at discharge.  Patient states he has had [facility] in the past and if he needs [facility] service at dc he would like to work with [facility].  Patient has a  drain as well , which will most likely dc before he discharges. Also await pt eval.   NCM will cont to follow for dc needs.                    Action/Plan:   Expected Discharge Date:                  Expected Discharge Plan:  [place]  In-House Referral:     Discharge planning Services  CM Consult  Post Acute Care Choice:    Choice offered to:     DME Arranged:    DME Agency:     HH Arranged:    [place] Agency:     Status of Service:  In process, will continue to follow  If discussed at Long Length of Stay Meetings, dates discussed:    Additional Comments:  Zenon Mayo, RN 08/30/2016, 12:15 PM

## 2016-08-30 NOTE — Care Management Important Message (Signed)
Important Message  Patient Details  Name: Michael Ellison MRN: [id number] Date of Birth: 17-Oct-1946   Medicare Important Message Given:  Yes    Zenon Mayo, RN 08/30/2016, 12:14 PMImportant Message  Patient Details  Name: Michael Ellison MRN: [id number] Date of Birth: August 23, 1946   Medicare Important Message Given:  Yes    Zenon Mayo, RN 08/30/2016, 12:14 PM

## 2016-08-30 NOTE — Progress Notes (Signed)
[facility] Progress Note     Subjective:   Patient doing well today. Sitting up in the chair; 4 L of oxygen in place. Eating and drinking well but occasional nausea.  Still no BM. UOP 1.9 L yesterday. Now net + 1.6 L since admission with IVF administration but weight down.    Objective:   BP (!) 145/118 (BP Location: Left Arm)   Pulse 93   Temp 98.4 F (36.9 C) (Oral)   Resp 18   Ht 6' 2.5" (1.892 m)   Wt 204 lb 11.2 oz (92.9 kg)   SpO2 96%   BMI 25.93 kg/m   Intake/Output Summary (Last 24 hours) at 08/30/16 0734 Last data filed at 08/30/16 [id number]  Gross per 24 hour  Intake             2840 ml  Output             2075 ml  Net              765 ml   Weight change: -3 lb 3.1 oz (-1.449 kg)  Physical Exam: [id number] gentlemen, sitting up in chair, NAD  CVS:RRR, no murmurs  Resp: Expiratory wheezing throughout (due for breathing treatment), no increased WOB [id number] binder in place, 2 abdominal bulb drains present Ext:No lower extremity edema   Imaging: Dg Chest Port 1 View  Result Date: 08/29/2016 CLINICAL DATA:  Patient status post hernia repair with mesh. EXAM: PORTABLE CHEST 1 VIEW COMPARISON:  Chest radiograph 08/27/2016. FINDINGS: Monitoring leads overlie the patient. Low lung volumes. Stable cardiac and mediastinal contours. Unchanged small left pleural effusion and heterogeneous opacities left lung base. No pneumothorax. Extensive bullous changes within the left upper hemi thorax. IMPRESSION: Low lung volumes with small left pleural effusion and underlying atelectasis. Emphysematous changes. Electronically Signed   By: Lovey Newcomer M.D.   On: 08/29/2016 07:43     Recent Labs Lab 08/27/16 0452 08/28/16 0315 08/29/16 0319 08/30/16 0435  NA 136 137 136 136  K 4.0 5.1 5.1 4.7  CL 102 102 103 104  CO2 23 26 26 23   GLUCOSE 180* 121* 111* 99  BUN 29* 54* 63* 33*  CREATININE 1.71* 2.79* 2.06* 1.04  CALCIUM 8.2* 8.3* 8.2* 8.3*  PHOS  --    --  2.9 2.7     Recent Labs Lab 08/27/16 0452 08/28/16 0315 08/29/16 0319 08/30/16 0435  WBC 21.5* 20.4* 16.2* 12.7*  NEUTROABS  --   --  13.3* 9.9*  HGB 15.3 14.2 13.1 12.7*  HCT 45.5 42.8 39.4 38.3*  MCV 82.7 82.6 82.9 83.8  PLT 242 232 227 233    Medications:    . docusate sodium  100 mg Oral BID  . enoxaparin (LOVENOX) injection  30 mg Subcutaneous Q24H  . escitalopram  10 mg Oral Daily  . HYDROmorphone   Intravenous Q4H  . ipratropium-albuterol  3 mL Nebulization TID  . mometasone-formoterol  2 puff Inhalation BID  . pantoprazole sodium  40 mg Per Tube Daily    Background: 70 y.o. male with history of COPD (previously on home O2 but not recently), recurrent abdominal hernias, HTN, prostate cancer s/p prostatectomy, and previous tobacco abuse presenting for incisional ventral hernia repair, performed on 11/9. Baseline SCr appears to be ~ 0.9. Post op increase to 2.79 with oliguria. Was hypotensive with symptoms (80's), had been receiving lisinopril. Low FeNa c/w pre-renal picture. In addition 200 cc in bladder when foley placed. Renal function improved with fluid  resuscitation.   Assessment/ Plan:    AKI (No prior history of kidney disease but has had volume responsive AKI in the past x 2): Pre-renal in setting of diuresis, vomiting, and no fluids x 2 days and low blood pressures, diagnosis supported by low FENa. UA without protein/RBCs and no casts noted. Creatinine falling and UOP improved with volume administration and improvement in BP. Renal will sign off.  - Stop IVFs given patient's improved PO intake.   - SCr 2.79> 2.06 > 1.04, UOP 1.9 L; Euvolemic on exam though weight down - Continue to monitor strict I/Os and SCr.  - D/C foley as residual was not large on the 11th  Elevated blood glucose: Glucose to 180 11/10. Could be acute stress response but will obtain hgb A1c. (pending)  S/p abdominal hernia repair: Has not yet had a BM. Does not know if he has passed  gas. On dilaudid PCA for pain.   HTN: Systolics in [id number] - Continue to hold lisinopril. Could restart norvasc if become more elevated.   COPD: Continue supplemental O2 prn for spO2 < 88%.   Olene Floss, MD [place], PGY-2 08/30/2016, 7:34 AM   I have personally seen and examined this patient and agree with the assessment/plan as outlined above.  Dramatic improvement in creatinine with IVF.  Cr nearly at baseline.  Stopping IVFs.  We are signing off.  Please do not hesitate to contact us with any questions or concerns. Sherry Rogus,MD 08/30/2016 1:16 PM

## 2016-08-31 LAB — RENAL FUNCTION PANEL
ALBUMIN: 2.7 g/dL — AB (ref 3.5–5.0)
ANION GAP: 7 (ref 5–15)
BUN: 28 mg/dL — AB (ref 6–20)
CALCIUM: 8.9 mg/dL (ref 8.9–10.3)
CO2: 27 mmol/L (ref 22–32)
Chloride: 104 mmol/L (ref 101–111)
Creatinine, Ser: 0.98 mg/dL (ref 0.61–1.24)
GFR calc Af Amer: >60 mL/min (ref >60)
Glucose, Bld: 125 mg/dL — ABNORMAL HIGH (ref 65–99)
PHOSPHORUS: 4 mg/dL (ref 2.5–4.6)
POTASSIUM: 4.5 mmol/L (ref 3.5–5.1)
SODIUM: 138 mmol/L (ref 135–145)

## 2016-08-31 LAB — CULTURE, RESPIRATORY W GRAM STAIN

## 2016-08-31 LAB — CULTURE, RESPIRATORY: SPECIAL REQUESTS: NORMAL

## 2016-08-31 MED ORDER — HYDROMORPHONE HCL 2 MG/ML IJ SOLN
0.5000 mg | INTRAMUSCULAR | Status: DC | PRN
  Administered 2016-08-31: 1 mg via INTRAVENOUS
  Filled 2016-08-31: qty 1

## 2016-08-31 MED ORDER — POLYETHYLENE GLYCOL 3350 17 G PO PACK
17.0000 g | PACK | Freq: Once | ORAL | Status: AC
  Administered 2016-08-31: 17 g via ORAL
  Filled 2016-08-31: qty 1

## 2016-08-31 MED ORDER — ENOXAPARIN SODIUM 40 MG/0.4ML ~~LOC~~ SOLN
40.0000 mg | SUBCUTANEOUS | Status: DC
  Administered 2016-09-01: 40 mg via SUBCUTANEOUS
  Filled 2016-08-31: qty 0.4

## 2016-08-31 NOTE — Progress Notes (Signed)
GS Progress Note Subjective: Patient doing well.  Wants to go home.  Will be able to soon.    Objective: Vital signs in last 24 hours: Temp:  [97.3 F (36.3 C)-98.3 F (36.8 C)] 97.9 F (36.6 C) (11/14 0230) Pulse Rate:  [96-117] 96 (11/14 0230) Resp:  [16-21] 16 (11/14 0352) BP: (120-153)/(76-99) 127/92 (11/14 0230) SpO2:  [91 %-98 %] 98 % (11/14 0352) Last BM Date: 08/25/16  Intake/Output from previous day: 11/13 0701 - 11/14 0700 In: 320 [P.O.:320] Out: 1745 [Urine:1625; Drains:120] Intake/Output this shift: No intake/output data recorded.  Lungs: Clear.  Oxygen saturations are 98%  Will discontinue oxygen  Abd: Good bowel sounds.  Passing flatus, no bowel movement  Extremities: No edema or other signs of DVT  Neuro: Intact.  A bit depressed.  Lab Results: CBC   Recent Labs  08/29/16 0319 08/30/16 0435  WBC 16.2* 12.7*  HGB 13.1 12.7*  HCT 39.4 38.3*  PLT 227 233   BMET  Recent Labs  08/30/16 0435 08/31/16 0317  NA 136 138  K 4.7 4.5  CL 104 104  CO2 23 27  GLUCOSE 99 125*  BUN 33* 28*  CREATININE 1.04 0.98  CALCIUM 8.3* 8.9   PT/INR No results for input(s): LABPROT, INR in the last 72 hours. ABG No results for input(s): PHART, HCO3 in the last 72 hours.  Invalid input(s): PCO2, PO2  Studies/Results: No results found.  Anti-infectives: Anti-infectives    Start     Dose/Rate Route Frequency Ordered Stop   08/26/16 1530  vancomycin (VANCOCIN) IVPB 1000 mg/200 mL premix     1,000 mg 200 mL/hr over 60 Minutes Intravenous Every 12 hours 08/26/16 1526 08/26/16 1820   08/26/16 1112  polymyxin B 500,000 Units, bacitracin 50,000 Units in sodium chloride irrigation 0.9 % 500 mL irrigation  Status:  Discontinued       As needed 08/26/16 1112 08/26/16 1214   08/26/16 0815  vancomycin (VANCOCIN) IVPB 1000 mg/200 mL premix     1,000 mg 200 mL/hr over 60 Minutes Intravenous To Surgery 08/26/16 0806 08/26/16 0804   08/26/16 0702  ceFAZolin (ANCEF)  IVPB 2g/100 mL premix  Status:  Discontinued     2 g 200 mL/hr over 30 Minutes Intravenous On call to O.R. 08/26/16 0702 08/26/16 1514      Assessment/Plan: s/p Procedure(s): VENTRAL INCISIONAL HERNIA REPAIR WITH MESH INSERTION OF MESH d/c foley Advance diet Plan for discharge tomorrow  LOS: 5 days    Kathryne Eriksson. Dahlia Bailiff, MD, FACS [phone] [facility] 08/31/2016

## 2016-08-31 NOTE — Progress Notes (Signed)
SATURATION QUALIFICATIONS: (This note is used to comply with regulatory documentation for home oxygen)  Patient Saturations on Room Air at Rest = 92-94%  Patient Saturations on Room Air while Ambulating = 78-84%  Patient Saturations on 3 Liters of oxygen while Ambulating = 92-96%  Please briefly explain why patient needs home oxygen: Pt needs oxygen with activity. Thanks  Tonia Brooms, SPT [phone]

## 2016-08-31 NOTE — Progress Notes (Signed)
Wasted 10 mL Dilaudid in Pyxis B sink with Morey Hummingbird, Therapist, sports.

## 2016-08-31 NOTE — Care Management (Signed)
PT recommending home health PT .   Will need MD order and face to face .   Spoke to patient and patient is agreeable , choice offered he would like [facility] . Patient aware MD order needed.   Patient already has home oxygen he believes oxygen is through [place].   Patient aware he may be discharged tomorrow 09-01-16 , he is unsure if his daughter can bring his portable tank tomorrow when she picks him up . Explained NCM will call [facility] to see if [facility] provides home oxygen if they do NCM can arrange for [facility] to bring portable oxygen tank  to room on day of discharge.  Patient voiced understanding and will also ask daughter to bring portable oxygen tank.   Awaiting call back from [place].

## 2016-08-31 NOTE — Progress Notes (Signed)
Physical Therapy Evaluation Patient Details Name: Michael Ellison MRN: [id number] DOB: 1946-09-07 Today's Date: 08/31/2016   History of Present Illness  70 y.o. male with history of COPD (previously on home O2 but not recently), recurrent abdominal hernias, HTN, prostate cancer s/p prostatectomy, and previous tobacco abuse presenting for incisional ventral hernia repair, performed on 11/9.   Clinical Impression  Pt does not have assistance at home but is adamant that he is going home. Pt demonstrates good ability to function with supervision in hospital environment with RW today with what appears to be only slight deviations from baseline in terms of ability to balance and activity tolerance. Pt was advised to use RW until he feels back to his previous level of function. Pt also required increased use of oxygen with ambulation which pt had oxygen at home prior to admission. Pt would benefit from home health PT to address deficits in activity tolerance and balance.     Follow Up Recommendations Home health PT    Equipment Recommendations  Other (comment) (home oxygen)    Recommendations for Other Services       Precautions / Restrictions Precautions Precautions: None Restrictions Weight Bearing Restrictions: No      Mobility  Bed Mobility Overal bed mobility: Independent                Transfers Overall transfer level: Needs assistance   Transfers: Sit to/from Stand Sit to Stand: Supervision         General transfer comment: Pt requires supervision for safety  Ambulation/Gait Ambulation/Gait assistance: Min guard Ambulation Distance (Feet): 350 Feet Assistive device: Rolling walker (2 wheeled) Gait Pattern/deviations: Step-through pattern;Decreased stride length;Trunk flexed Gait velocity: decreased Gait velocity interpretation: Below normal speed for age/gender General Gait Details: Pt demonstrated increased reliance on UE with ambulation; pt trialed walking  without RW but demonstrated shuffled gait and decreased balance without use of RW  Stairs            Wheelchair Mobility    Modified Rankin (Stroke Patients Only)       Balance Overall balance assessment: Needs assistance Sitting-balance support: Feet supported;Single extremity supported Sitting balance-Leahy Scale: Fair     Standing balance support: During functional activity;No upper extremity supported Standing balance-Leahy Scale: Fair Standing balance comment: Pt is able to maintain static standing balance without assistance as he was able to use urinal independently; however, he would benefit from supervision for safety                             Pertinent Vitals/Pain Pain Assessment: Faces Faces Pain Scale: Hurts a little bit Pain Location: abdomen Pain Descriptors / Indicators: Aching;Sore Pain Intervention(s): Limited activity within patient's tolerance;Repositioned          Patient Saturations on Room Air at Rest = 92-94%         Patient Saturations on Room Air while Ambulating = 78-84%         Patient Saturations on 3 Liters of oxygen while Ambulating = 92-96%         Please briefly explain why patient needs home oxygen: Pt needs oxygen with activity.   Home Living Family/patient expects to be discharged to:: Private residence Living Arrangements: Alone Available Help at Discharge: Family   Home Access: Level entry     [place]: One [place]: Environmental consultant - 2 wheels      Prior Function Level of Independence: Independent  Hand Dominance        Extremity/Trunk Assessment   Upper Extremity Assessment: Overall WFL for tasks assessed           Lower Extremity Assessment: Overall WFL for tasks assessed (4+/5 strength hip flexion; 5/5 knee extension and DF)         Communication   Communication: No difficulties  Cognition Arousal/Alertness: Awake/alert Behavior During Therapy: WFL for tasks  assessed/performed Overall Cognitive Status: Within Functional Limits for tasks assessed                      General Comments General comments (skin integrity, edema, etc.): Pt was irate at start of session, c/o wanting to go home and lack of service during hospital stay. Pt also urinated while ambulating in hallway and did not realize until he felt his socks were wet. Pt was given urinal and assisted with cleaning.    Exercises     Assessment/Plan    PT Assessment Patient needs continued PT services  PT Problem List Decreased balance;Decreased mobility;Decreased activity tolerance          PT Treatment Interventions Gait training;Therapeutic activities;Therapeutic exercise;Balance training;DME instruction;Functional mobility training;Patient/family education    PT Goals (Current goals can be found in the Care Plan section)  Acute Rehab PT Goals Patient Stated Goal: to go home as soon as possible PT Goal Formulation: With patient Time For Goal Achievement: 09/07/16 Potential to Achieve Goals: Good    Frequency Min 3X/week   Barriers to discharge        Co-evaluation               End of Session Equipment Utilized During Treatment: Gait belt Activity Tolerance: Patient tolerated treatment well Patient left: in chair;with call bell/phone within reach Nurse Communication: Mobility status         Time: [id number] PT Time Calculation (min) (ACUTE ONLY): 38 min   Charges:   PT Evaluation $PT Eval Moderate Complexity: 1 Procedure PT Treatments $Gait Training: 8-22 mins $Self Care/Home Management: 8-22   PT G Codes:        Tonia Brooms 09/12/2016, 12:03 PM  Tonia Brooms, SPT [phone]

## 2016-09-01 LAB — RENAL FUNCTION PANEL
Albumin: 2.7 g/dL — ABNORMAL LOW (ref 3.5–5.0)
Anion gap: 9 (ref 5–15)
BUN: 23 mg/dL — ABNORMAL HIGH (ref 6–20)
CO2: 26 mmol/L (ref 22–32)
Calcium: 8.6 mg/dL — ABNORMAL LOW (ref 8.9–10.3)
Chloride: 102 mmol/L (ref 101–111)
Creatinine, Ser: 1.1 mg/dL (ref 0.61–1.24)
GFR calc Af Amer: >60 mL/min
GFR calc non Af Amer: >60 mL/min
Glucose, Bld: 110 mg/dL — ABNORMAL HIGH (ref 65–99)
Phosphorus: 3.7 mg/dL (ref 2.5–4.6)
Potassium: 4.1 mmol/L (ref 3.5–5.1)
Sodium: 137 mmol/L (ref 135–145)

## 2016-09-01 MED ORDER — LEVOFLOXACIN 750 MG PO TABS: 750.0000 mg | ORAL_TABLET | Freq: Every day | ORAL | Status: DC

## 2016-09-01 MED ORDER — LEVOFLOXACIN 750 MG PO TABS: 750.0000 mg | ORAL_TABLET | Freq: Every day | ORAL | 0 refills | Status: DC

## 2016-09-01 MED ORDER — DOCUSATE SODIUM 100 MG PO CAPS: 100.0000 mg | ORAL_CAPSULE | Freq: Two times a day (BID) | ORAL | 0 refills | Status: DC

## 2016-09-01 MED ORDER — OXYCODONE HCL 5 MG PO TABS: 5.0000 mg | ORAL_TABLET | ORAL | 0 refills | Status: DC | PRN

## 2016-09-01 NOTE — Discharge Instructions (Signed)
Ventral Hernia Repair, Care After This sheet gives you information about how to care for yourself after your procedure. Your health care provider may also give you more specific instructions. If you have problems or questions, contact your health care provider. What can I expect after the procedure? After the procedure, it is common to have:  Pain, discomfort, or soreness. Follow these instructions at home: Incision care  Follow instructions from your health care provider about how to take care of your incision. Make sure you:  Wash your hands with soap and water before you change your bandage (dressing) or before you touch your abdomen. If soap and water are not available, use hand sanitizer.  Change your dressing as told by your health care provider.  Leave stitches (sutures), skin glue, or adhesive strips in place. These skin closures may need to stay in place for 2 weeks or longer. If adhesive strip edges start to loosen and curl up, you may trim the loose edges. Do not remove adhesive strips completely unless your health care provider tells you to do that.  Check your incision area every day for signs of infection. Check for:  Redness, swelling, or pain.  Fluid or blood.  Warmth.  Pus or a bad smell. Bathing  Do not take baths, swim, or use a hot tub until your health care provider approves. Ask your health care provider if you can take showers. You may only be allowed to take sponge baths for bathing.  Keep your bandage (dressing) dry until your health care provider says it can be removed. Activity  Do not lift anything that is heavier than 10 lb (4.5 kg) until your health care provider approves.  Do not drive or use heavy machinery while taking prescription pain medicine. Ask your health care provider when it is safe for you to drive or use heavy machinery.  Do not drive for 24 hours if you were given a medicine to help you relax (sedative) during your procedure.  Rest  as told by your health care provider. You may return to your normal activities when your health care provider approves. General instructions  Take over-the-counter and prescription medicines only as told by your health care provider.  To prevent or treat constipation while you are taking prescription pain medicine, your health care provider may recommend that you:  Take over-the-counter or prescription medicines.  Eat foods that are high in fiber, such as fresh fruits and vegetables, whole grains, and beans.  Limit foods that are high in fat and processed sugars, such as fried and sweet foods.  Drink enough fluid to keep your urine clear or pale yellow.  Hold a pillow over your abdomen when you cough or sneeze. This helps with pain.  Keep all follow-up visits as told by your health care provider. This is important.  You will have a home health nurse to cme help care for your wound and drain after discharge Contact a health care provider if:  You have:  A fever or chills.  Redness, swelling, or pain around your incision.  Fluid or blood coming from your incision.  Pus or a bad smell coming from your incision.  Pain that gets worse or does not get better with medicine.  Nausea or vomiting.  A cough.  Shortness of breath.  Your incision feels warm to the touch.  You have not had a bowel movement in three days.  You are not able to urinate. Get help right away if:  You  have severe pain in your abdomen.  You have persistent nausea and vomiting.  You have redness, warmth, or pain in your leg.  You have chest pain.  You have trouble breathing. Summary  After this procedure, it is common to have pain, discomfort, or soreness.  Follow instructions from your health care provider about how to take care of your incision.  Check your incision area every day for signs of infection. Report any signs of infection to your health care provider.  Keep all follow-up visits  as told by your health care provider. This is important. This information is not intended to replace advice given to you by your health care provider. Make sure you discuss any questions you have with your health care provider. Document Released: 09/20/2012 Document Revised: 05/26/2016 Document Reviewed: 05/26/2016 Elsevier Interactive Patient Education  2017 [employer].

## 2016-09-01 NOTE — Care Management Note (Addendum)
Case Management Note  Patient Details  Name: BURCE TOYE MRN: [id number] Date of Birth: 1946-05-11  Subjective/Objective:                    Action/Plan: Home health RN arranged through [place].  Spoke with Sunday Spillers at Dr Richarda Blade office regarding HHPT awaiting call back.  Spoke to Dr [facility] PT added.  Expected Discharge Date:                  Expected Discharge Plan:  [place]  In-House Referral:     Discharge planning Services  CM Consult  Post Acute Care Choice:    Choice offered to:  Patient  DME Arranged:    DME Agency:     HH Arranged:  RN [place] Agency:  [place]  Status of Service:  Completed, signed off  If discussed at [place] of Stay Meetings, dates discussed:    Additional Comments:  Marilu Favre, RN 09/01/2016, 9:36 AM

## 2016-09-01 NOTE — Progress Notes (Signed)
Patient discharged to home with instructions and prescription.  JP drain instructions and return demonstrated.

## 2016-09-01 NOTE — Discharge Summary (Addendum)
Physician Discharge Summary  Patient ID: Michael Ellison MRN: [id number] DOB/AGE: 25-Sep-1946 70 y.o.  Admit date: 08/26/2016 Discharge date: 09/01/2016  Admission Diagnoses:  Recurrent Ventral/Incisional hernia  Discharge Diagnoses: Same and COPD with exacerbation Post-operative Klebsiella Pneumonia Acute renal insufficiency Active Problems:   Ventral hernia without obstruction or gangrene   Discharged Condition: fair  Hospital Course: Admitted after complex, recurrent ventral hernia repair with TAR and Mesh.  Had likely Vancomycin induced renal insufficiency related to perioperative Vancomycin given x 2 doses.  Resolve with hydration and holding lisinopril and diuretics. Transferred to floor 3 days ago, did well on telemetry.   Had two Blake drains coming out the bilateral lower part of hte abdominal wall, the right sided drain was removed today prior to discharge.  Minimal drainage or pain.  Will likely remove the last one next week.  Sputum cultures during admission demonstrated growth of Klebsiella, sensitive to quinolones.  Patient will be placed on a 7 day course of Levaquin.    Consults: Nephrology  Significant Diagnostic Studies: labs: CBC/Bmet and radiology: CXR: atelectasis bilaterally  Treatments: IV hydration, antibiotics: vancomycin, respiratory therapy: O2, albuterol/atropine nebulizer and chest PT, therapies: PT and surgery: Ventral hernia repair  Discharge Exam: Blood pressure 136/78, pulse 87, temperature 97.9 F (36.6 C), resp. rate 17, height 6' 2.5" (1.892 m), weight 94 kg (207 lb 3.7 oz), SpO2 98 %. General appearance: alert and mild distress Cardio: regular rate and rhythm, S1, S2 normal, no murmur, click, rub or gallop GI: Mildly distended, good bowel sounds, Had BM yesterday.  Passing flatus  Disposition: 01-Home or Self Care  Discharge Instructions    Call MD for:  difficulty breathing, headache or visual disturbances    Complete by:  As directed     Call MD for:  extreme fatigue    Complete by:  As directed    Call MD for:  hives    Complete by:  As directed    Call MD for:  persistant dizziness or light-headedness    Complete by:  As directed    Call MD for:  persistant nausea and vomiting    Complete by:  As directed    Call MD for:  redness, tenderness, or signs of infection (pain, swelling, redness, odor or green/yellow discharge around incision site)    Complete by:  As directed    Call MD for:  severe uncontrolled pain    Complete by:  As directed    Call MD for:  temperature >100.4    Complete by:  As directed    Diet - low sodium heart healthy    Complete by:  As directed    Discharge instructions    Complete by:  As directed    See ventral hernia discharge orders   Driving Restrictions    Complete by:  As directed    No driving for 2 weeks   Face-to-face encounter (required for Medicare/Medicaid patients)    Complete by:  As directed    I Avaree Gilberti certify that this patient is under my care and that I, or a nurse practitioner or physician's assistant working with me, had a face-to-face encounter that meets the physician face-to-face encounter requirements with this patient on 09/01/2016. The encounter with the patient was in whole, or in part for the following medical condition(s) which is the primary reason for home health care (List medical condition): Complex ventral hernia repair and history of COPD.   The encounter with the patient was in  whole, or in part, for the following medical condition, which is the primary reason for home health care:  Deconditions, COPD, limited ambulatory capacity, wound care, Blake drain management   I certify that, based on my findings, the following services are medically necessary home health services:  Nursing   Reason for Medically Necessary Home Health Services:  Skilled Nursing- Post-Surgical Wound Assessment and Care   My clinical findings support the need for the above services:   Unable to leave home safely without assistance and/or assistive device   Further, I certify that my clinical findings support that this patient is homebound due to:  Unable to leave home safely without assistance   Home Health    Complete by:  As directed    Wound check and Blake drain management   To provide the following care/treatments:  RN   Increase activity slowly    Complete by:  As directed    Lifting restrictions    Complete by:  As directed    No lifting > 20 pounds for the next 6 weeks   Other Restrictions    Complete by:  As directed    Wear abdominal binder at all times except in showers.       Medication List    STOP taking these medications   ibuprofen 200 MG tablet Commonly known as:  ADVIL,MOTRIN   lisinopril 20 MG tablet Commonly known as:  PRINIVIL,ZESTRIL   polyethylene glycol-electrolytes 420 g solution Commonly known as:  TRILYTE     TAKE these medications   albuterol 108 (90 Base) MCG/ACT inhaler Commonly known as:  PROVENTIL HFA;VENTOLIN HFA Inhale 2 puffs into the lungs every 4 (four) hours as needed for wheezing or shortness of breath.   amLODipine 5 MG tablet Commonly known as:  NORVASC Take 5 mg by mouth daily.   aspirin EC 81 MG tablet Take 81 mg by mouth once a week.   budesonide-formoterol 160-4.5 MCG/ACT inhaler Commonly known as:  SYMBICORT Inhale 2 puffs into the lungs 2 (two) times daily.   docusate sodium 100 MG capsule Commonly known as:  COLACE Take 1 capsule (100 mg total) by mouth 2 (two) times daily.   escitalopram 10 MG tablet Commonly known as:  LEXAPRO Take 10 mg by mouth daily.   fesoterodine 8 MG Tb24 tablet Commonly known as:  TOVIAZ Take 8 mg by mouth.   hydrochlorothiazide 25 MG tablet Commonly known as:  HYDRODIURIL Take 25 mg by mouth daily.   ipratropium-albuterol 0.5-2.5 (3) MG/3ML Soln Commonly known as:  DUONEB Take 3 mLs by nebulization every 6 (six) hours as needed (for shortness of  breath/wheezing).   levofloxacin 750 MG tablet Commonly known as:  LEVAQUIN Take 1 tablet (750 mg total) by mouth daily.   oxyCODONE 5 MG immediate release tablet Commonly known as:  Oxy IR/ROXICODONE Take 1-2 tablets (5-10 mg total) by mouth every 4 (four) hours as needed for severe pain.   polyethylene glycol packet Commonly known as:  MIRALAX / GLYCOLAX Take 17 g by mouth daily as needed for mild constipation.   traMADol-acetaminophen 37.5-325 MG tablet Commonly known as:  ULTRACET Take 1 tablet by mouth 2 (two) times daily as needed for moderate pain or severe pain.      Follow-up Information    Judeth Horn, MD. Call on 09/08/2016.   Specialty:  General Surgery Why:  Call my office to confirm appointment with Sharyn Lull.  Will see in the afternoon Contact information: [address] [place] [address] [phone]           Signed: Judeth Horn 09/01/2016, 11:52 AM

## 2016-10-23 DIAGNOSIS — J449 Chronic obstructive pulmonary disease, unspecified: Secondary | ICD-10-CM | POA: Diagnosis not present

## 2016-11-23 DIAGNOSIS — J449 Chronic obstructive pulmonary disease, unspecified: Secondary | ICD-10-CM | POA: Diagnosis not present

## 2016-12-21 DIAGNOSIS — J449 Chronic obstructive pulmonary disease, unspecified: Secondary | ICD-10-CM | POA: Diagnosis not present

## 2016-12-24 DIAGNOSIS — J449 Chronic obstructive pulmonary disease, unspecified: Secondary | ICD-10-CM | POA: Diagnosis not present

## 2016-12-24 DIAGNOSIS — I10 Essential (primary) hypertension: Secondary | ICD-10-CM | POA: Diagnosis not present

## 2017-01-21 DIAGNOSIS — J449 Chronic obstructive pulmonary disease, unspecified: Secondary | ICD-10-CM | POA: Diagnosis not present

## 2017-02-20 DIAGNOSIS — J449 Chronic obstructive pulmonary disease, unspecified: Secondary | ICD-10-CM | POA: Diagnosis not present

## 2017-02-26 DIAGNOSIS — H40033 Anatomical narrow angle, bilateral: Secondary | ICD-10-CM | POA: Diagnosis not present

## 2017-02-26 DIAGNOSIS — H04123 Dry eye syndrome of bilateral lacrimal glands: Secondary | ICD-10-CM | POA: Diagnosis not present

## 2017-03-21 DIAGNOSIS — Z1389 Encounter for screening for other disorder: Secondary | ICD-10-CM | POA: Diagnosis not present

## 2017-03-21 DIAGNOSIS — C61 Malignant neoplasm of prostate: Secondary | ICD-10-CM | POA: Diagnosis not present

## 2017-03-21 DIAGNOSIS — J449 Chronic obstructive pulmonary disease, unspecified: Secondary | ICD-10-CM | POA: Diagnosis not present

## 2017-03-21 DIAGNOSIS — I10 Essential (primary) hypertension: Secondary | ICD-10-CM | POA: Diagnosis not present

## 2017-03-23 DIAGNOSIS — J449 Chronic obstructive pulmonary disease, unspecified: Secondary | ICD-10-CM | POA: Diagnosis not present

## 2017-04-22 DIAGNOSIS — J449 Chronic obstructive pulmonary disease, unspecified: Secondary | ICD-10-CM | POA: Diagnosis not present

## 2017-05-23 DIAGNOSIS — J449 Chronic obstructive pulmonary disease, unspecified: Secondary | ICD-10-CM | POA: Diagnosis not present

## 2017-06-22 DIAGNOSIS — I10 Essential (primary) hypertension: Secondary | ICD-10-CM | POA: Diagnosis not present

## 2017-06-22 DIAGNOSIS — K4090 Unilateral inguinal hernia, without obstruction or gangrene, not specified as recurrent: Secondary | ICD-10-CM | POA: Diagnosis not present

## 2017-06-22 DIAGNOSIS — J449 Chronic obstructive pulmonary disease, unspecified: Secondary | ICD-10-CM | POA: Diagnosis not present

## 2017-06-23 DIAGNOSIS — J449 Chronic obstructive pulmonary disease, unspecified: Secondary | ICD-10-CM | POA: Diagnosis not present

## 2017-07-12 ENCOUNTER — Ambulatory Visit: Payer: Self-pay | Admitting: General Surgery

## 2017-07-23 DIAGNOSIS — J449 Chronic obstructive pulmonary disease, unspecified: Secondary | ICD-10-CM | POA: Diagnosis not present

## 2017-07-27 DIAGNOSIS — Z23 Encounter for immunization: Secondary | ICD-10-CM | POA: Diagnosis not present

## 2017-08-16 ENCOUNTER — Ambulatory Visit: Payer: Medicare Other | Admitting: General Surgery

## 2017-08-23 DIAGNOSIS — J449 Chronic obstructive pulmonary disease, unspecified: Secondary | ICD-10-CM | POA: Diagnosis not present

## 2017-08-25 ENCOUNTER — Encounter: Payer: Self-pay | Admitting: General Surgery

## 2017-08-25 ENCOUNTER — Ambulatory Visit: Payer: Medicare Other | Admitting: General Surgery

## 2017-08-25 VITALS — BP 139/95 | HR 88 | Temp 98.7°F | Resp 18 | Ht 75.0 in | Wt 199.0 lb

## 2017-08-25 DIAGNOSIS — K4090 Unilateral inguinal hernia, without obstruction or gangrene, not specified as recurrent: Secondary | ICD-10-CM

## 2017-08-25 NOTE — Progress Notes (Signed)
Michael Ellison; [id number]; 1946/05/08   HPI Patient is a 71 year old black male who was referred to my care by Dr. Legrand Rams for evaluation and treatment of right inguinal hernia.  Is been present for the past few months but is worsening in size and causing discomfort.  He currently has no pain.  It is made worse with straining.  It does resolve when lying down. Past Medical History:  Diagnosis Date  . Arthritis   . Aspiration pneumonia ([facility]) 03/01/2012  . Asthma   . Cancer ([place])   . Cancer of prostate ([place])   . COPD (chronic obstructive pulmonary disease) ([facility])    home O2   PRN    . Depression   . GERD (gastroesophageal reflux disease)   . Hernia of abdominal cavity   . Hypertension   . On home O2    3L prn  . Shortness of breath     Past Surgical History:  Procedure Laterality Date  . HERNIA REPAIR    . PROSTATE BIOPSY    . robotic prostate surgery  2011  . TRACHEOSTOMY  03/2012  . VENTRAL HERNIA REPAIR  12/25/2012    Dr Hulen Skains  . ZENKER'S DIVERTICULECTOMY  2013   Dr. Redmond Baseman    Family History  Problem Relation Age of Onset  . Pseudochol deficiency Neg Hx   . Anesthesia problems Neg Hx   . Hypotension Neg Hx   . Malignant hyperthermia Neg Hx   . Colon cancer Neg Hx     Current Outpatient Medications on File Prior to Visit  Medication Sig Dispense Refill  . albuterol (PROVENTIL HFA;VENTOLIN HFA) 108 (90 Base) MCG/ACT inhaler Inhale 2 puffs into the lungs every 4 (four) hours as needed for wheezing or shortness of breath. 1 Inhaler 0  . amLODipine (NORVASC) 5 MG tablet Take 5 mg by mouth daily.    Marland Kitchen aspirin EC 81 MG tablet Take 81 mg by mouth once a week.    . budesonide-formoterol (SYMBICORT) 160-4.5 MCG/ACT inhaler Inhale 2 puffs into the lungs 2 (two) times daily. 1 Inhaler 12  . docusate sodium (COLACE) 100 MG capsule Take 1 capsule (100 mg total) by mouth 2 (two) times daily. 10 capsule 0  . escitalopram (LEXAPRO) 10 MG tablet Take 10 mg by mouth daily.    .  fesoterodine (TOVIAZ) 8 MG TB24 tablet Take 8 mg by mouth.    . hydrochlorothiazide (HYDRODIURIL) 25 MG tablet Take 25 mg by mouth daily.    Marland Kitchen ipratropium-albuterol (DUONEB) 0.5-2.5 (3) MG/3ML SOLN Take 3 mLs by nebulization every 6 (six) hours as needed (for shortness of breath/wheezing).     Marland Kitchen levofloxacin (LEVAQUIN) 750 MG tablet Take 1 tablet (750 mg total) by mouth daily. 7 tablet 0  . oxyCODONE (OXY IR/ROXICODONE) 5 MG immediate release tablet Take 1-2 tablets (5-10 mg total) by mouth every 4 (four) hours as needed for severe pain. 30 tablet 0  . polyethylene glycol (MIRALAX / GLYCOLAX) packet Take 17 g by mouth daily as needed for mild constipation.    . traMADol-acetaminophen (ULTRACET) 37.5-325 MG per tablet Take 1 tablet by mouth 2 (two) times daily as needed for moderate pain or severe pain.      No current facility-administered medications on file prior to visit.     Allergies  Allergen Reactions  . Lorazepam Other (See Comments)    SEVERE DELERIUM AGITATION Tolerates midazolam and other benzo's    Social History   Substance and Sexual Activity  Alcohol Use  No  . Alcohol/week: 0.0 oz   Comment: sober since ~ 02/2012    Social History   Tobacco Use  Smoking Status Former Smoker  . Packs/day: 1.50  . Years: 44.00  . Pack years: 66.00  . Types: Cigarettes  . Last attempt to quit: 02/22/2012  . Years since quitting: 5.5  Smokeless Tobacco Never Used  Tobacco Comment   Quit x 4 years    Review of Systems  Constitutional: Negative.   HENT: Positive for ear pain and sinus pain.   Eyes: Positive for blurred vision.  Respiratory: Positive for cough and wheezing.   Cardiovascular: Negative.   Gastrointestinal: Positive for abdominal pain.  Genitourinary: Positive for frequency and urgency.  Musculoskeletal: Positive for back pain, joint pain and neck pain.  Skin: Negative.   Neurological: Positive for headaches.  Endo/Heme/Allergies: Negative.    Psychiatric/Behavioral: Negative.     Objective   Vitals:   08/25/17 1112  BP: (!) 139/95  Pulse: 88  Resp: 18  Temp: 98.7 F (37.1 C)    Physical Exam  Constitutional: He is oriented to person, place, and time and well-developed, well-nourished, and in no distress.  HENT:  Head: Normocephalic and atraumatic.  Cardiovascular: Normal rate, regular rhythm and normal heart sounds. Exam reveals no gallop and no friction rub.  No murmur heard. Pulmonary/Chest: Effort normal and breath sounds normal. No respiratory distress. He has no wheezes. He has no rales.  Abdominal: Soft. Bowel sounds are normal. He exhibits no distension. There is no tenderness. There is no rebound.  Reducible right inguinal hernia noted.  Genitourinary:  Genitourinary Comments: Genitourinary examination within normal limits  Neurological: He is alert and oriented to person, place, and time.  Skin: Skin is warm and dry.  Vitals reviewed.  Dr. Josephine Cables notes reviewed. Assessment  Right inguinal hernia Plan   Patient is scheduled for right inguinal herniorrhaphy with mesh on 08/29/2017.  The risks and benefits of the procedure including bleeding, infection, mesh use, and the possibility of recurrence of the hernia were fully explained to the patient, who gave informed consent.

## 2017-08-25 NOTE — H&P (View-Only) (Signed)
Michael Ellison; [id number]; Nov 27, 1945   HPI Patient is a 71 year old black male who was referred to my care by Dr. Legrand Rams for evaluation and treatment of right inguinal hernia.  Is been present for the past few months but is worsening in size and causing discomfort.  He currently has no pain.  It is made worse with straining.  It does resolve when lying down. Past Medical History:  Diagnosis Date  . Arthritis   . Aspiration pneumonia ([employer]) 03/01/2012  . Asthma   . Cancer ([place])   . Cancer of prostate ([place])   . COPD (chronic obstructive pulmonary disease) ([facility])    home O2   PRN    . Depression   . GERD (gastroesophageal reflux disease)   . Hernia of abdominal cavity   . Hypertension   . On home O2    3L prn  . Shortness of breath     Past Surgical History:  Procedure Laterality Date  . HERNIA REPAIR    . PROSTATE BIOPSY    . robotic prostate surgery  2011  . TRACHEOSTOMY  03/2012  . VENTRAL HERNIA REPAIR  12/25/2012    Dr Hulen Skains  . ZENKER'S DIVERTICULECTOMY  2013   Dr. Redmond Baseman    Family History  Problem Relation Age of Onset  . Pseudochol deficiency Neg Hx   . Anesthesia problems Neg Hx   . Hypotension Neg Hx   . Malignant hyperthermia Neg Hx   . Colon cancer Neg Hx     Current Outpatient Medications on File Prior to Visit  Medication Sig Dispense Refill  . albuterol (PROVENTIL HFA;VENTOLIN HFA) 108 (90 Base) MCG/ACT inhaler Inhale 2 puffs into the lungs every 4 (four) hours as needed for wheezing or shortness of breath. 1 Inhaler 0  . amLODipine (NORVASC) 5 MG tablet Take 5 mg by mouth daily.    Marland Kitchen aspirin EC 81 MG tablet Take 81 mg by mouth once a week.    . budesonide-formoterol (SYMBICORT) 160-4.5 MCG/ACT inhaler Inhale 2 puffs into the lungs 2 (two) times daily. 1 Inhaler 12  . docusate sodium (COLACE) 100 MG capsule Take 1 capsule (100 mg total) by mouth 2 (two) times daily. 10 capsule 0  . escitalopram (LEXAPRO) 10 MG tablet Take 10 mg by mouth daily.    .  fesoterodine (TOVIAZ) 8 MG TB24 tablet Take 8 mg by mouth.    . hydrochlorothiazide (HYDRODIURIL) 25 MG tablet Take 25 mg by mouth daily.    Marland Kitchen ipratropium-albuterol (DUONEB) 0.5-2.5 (3) MG/3ML SOLN Take 3 mLs by nebulization every 6 (six) hours as needed (for shortness of breath/wheezing).     Marland Kitchen levofloxacin (LEVAQUIN) 750 MG tablet Take 1 tablet (750 mg total) by mouth daily. 7 tablet 0  . oxyCODONE (OXY IR/ROXICODONE) 5 MG immediate release tablet Take 1-2 tablets (5-10 mg total) by mouth every 4 (four) hours as needed for severe pain. 30 tablet 0  . polyethylene glycol (MIRALAX / GLYCOLAX) packet Take 17 g by mouth daily as needed for mild constipation.    . traMADol-acetaminophen (ULTRACET) 37.5-325 MG per tablet Take 1 tablet by mouth 2 (two) times daily as needed for moderate pain or severe pain.      No current facility-administered medications on file prior to visit.     Allergies  Allergen Reactions  . Lorazepam Other (See Comments)    SEVERE DELERIUM AGITATION Tolerates midazolam and other benzo's    Social History   Substance and Sexual Activity  Alcohol Use  No  . Alcohol/week: 0.0 oz   Comment: sober since ~ 02/2012    Social History   Tobacco Use  Smoking Status Former Smoker  . Packs/day: 1.50  . Years: 44.00  . Pack years: 66.00  . Types: Cigarettes  . Last attempt to quit: 02/22/2012  . Years since quitting: 5.5  Smokeless Tobacco Never Used  Tobacco Comment   Quit x 4 years    Review of Systems  Constitutional: Negative.   HENT: Positive for ear pain and sinus pain.   Eyes: Positive for blurred vision.  Respiratory: Positive for cough and wheezing.   Cardiovascular: Negative.   Gastrointestinal: Positive for abdominal pain.  Genitourinary: Positive for frequency and urgency.  Musculoskeletal: Positive for back pain, joint pain and neck pain.  Skin: Negative.   Neurological: Positive for headaches.  Endo/Heme/Allergies: Negative.    Psychiatric/Behavioral: Negative.     Objective   Vitals:   08/25/17 1112  BP: (!) 139/95  Pulse: 88  Resp: 18  Temp: 98.7 F (37.1 C)    Physical Exam  Constitutional: He is oriented to person, place, and time and well-developed, well-nourished, and in no distress.  HENT:  Head: Normocephalic and atraumatic.  Cardiovascular: Normal rate, regular rhythm and normal heart sounds. Exam reveals no gallop and no friction rub.  No murmur heard. Pulmonary/Chest: Effort normal and breath sounds normal. No respiratory distress. He has no wheezes. He has no rales.  Abdominal: Soft. Bowel sounds are normal. He exhibits no distension. There is no tenderness. There is no rebound.  Reducible right inguinal hernia noted.  Genitourinary:  Genitourinary Comments: Genitourinary examination within normal limits  Neurological: He is alert and oriented to person, place, and time.  Skin: Skin is warm and dry.  Vitals reviewed.  Dr. Josephine Cables notes reviewed. Assessment  Right inguinal hernia Plan   Patient is scheduled for right inguinal herniorrhaphy with mesh on 08/29/2017.  The risks and benefits of the procedure including bleeding, infection, mesh use, and the possibility of recurrence of the hernia were fully explained to the patient, who gave informed consent.

## 2017-08-25 NOTE — Patient Instructions (Signed)

## 2017-08-25 NOTE — H&P (Signed)
Michael Ellison; [id number]; September 02, 1946   HPI Patient is a 71 year old black male who was referred to my care by Dr. Legrand Rams for evaluation and treatment of right inguinal hernia.  Is been present for the past few months but is worsening in size and causing discomfort.  He currently has no pain.  It is made worse with straining.  It does resolve when lying down. Past Medical History:  Diagnosis Date  . Arthritis   . Aspiration pneumonia ([name]) 03/01/2012  . Asthma   . Cancer ([name])   . Cancer of prostate ([place])   . COPD (chronic obstructive pulmonary disease) ([facility])    home O2   PRN    . Depression   . GERD (gastroesophageal reflux disease)   . Hernia of abdominal cavity   . Hypertension   . On home O2    3L prn  . Shortness of breath     Past Surgical History:  Procedure Laterality Date  . HERNIA REPAIR    . PROSTATE BIOPSY    . robotic prostate surgery  2011  . TRACHEOSTOMY  03/2012  . VENTRAL HERNIA REPAIR  12/25/2012    Dr Hulen Skains  . ZENKER'S DIVERTICULECTOMY  2013   Dr. Redmond Baseman    Family History  Problem Relation Age of Onset  . Pseudochol deficiency Neg Hx   . Anesthesia problems Neg Hx   . Hypotension Neg Hx   . Malignant hyperthermia Neg Hx   . Colon cancer Neg Hx     Current Outpatient Medications on File Prior to Visit  Medication Sig Dispense Refill  . albuterol (PROVENTIL HFA;VENTOLIN HFA) 108 (90 Base) MCG/ACT inhaler Inhale 2 puffs into the lungs every 4 (four) hours as needed for wheezing or shortness of breath. 1 Inhaler 0  . amLODipine (NORVASC) 5 MG tablet Take 5 mg by mouth daily.    Marland Kitchen aspirin EC 81 MG tablet Take 81 mg by mouth once a week.    . budesonide-formoterol (SYMBICORT) 160-4.5 MCG/ACT inhaler Inhale 2 puffs into the lungs 2 (two) times daily. 1 Inhaler 12  . docusate sodium (COLACE) 100 MG capsule Take 1 capsule (100 mg total) by mouth 2 (two) times daily. 10 capsule 0  . escitalopram (LEXAPRO) 10 MG tablet Take 10 mg by mouth daily.    .  fesoterodine (TOVIAZ) 8 MG TB24 tablet Take 8 mg by mouth.    . hydrochlorothiazide (HYDRODIURIL) 25 MG tablet Take 25 mg by mouth daily.    Marland Kitchen ipratropium-albuterol (DUONEB) 0.5-2.5 (3) MG/3ML SOLN Take 3 mLs by nebulization every 6 (six) hours as needed (for shortness of breath/wheezing).     Marland Kitchen levofloxacin (LEVAQUIN) 750 MG tablet Take 1 tablet (750 mg total) by mouth daily. 7 tablet 0  . oxyCODONE (OXY IR/ROXICODONE) 5 MG immediate release tablet Take 1-2 tablets (5-10 mg total) by mouth every 4 (four) hours as needed for severe pain. 30 tablet 0  . polyethylene glycol (MIRALAX / GLYCOLAX) packet Take 17 g by mouth daily as needed for mild constipation.    . traMADol-acetaminophen (ULTRACET) 37.5-325 MG per tablet Take 1 tablet by mouth 2 (two) times daily as needed for moderate pain or severe pain.      No current facility-administered medications on file prior to visit.     Allergies  Allergen Reactions  . Lorazepam Other (See Comments)    SEVERE DELERIUM AGITATION Tolerates midazolam and other benzo's    Social History   Substance and Sexual Activity  Alcohol Use  No  . Alcohol/week: 0.0 oz   Comment: sober since ~ 02/2012    Social History   Tobacco Use  Smoking Status Former Smoker  . Packs/day: 1.50  . Years: 44.00  . Pack years: 66.00  . Types: Cigarettes  . Last attempt to quit: 02/22/2012  . Years since quitting: 5.5  Smokeless Tobacco Never Used  Tobacco Comment   Quit x 4 years    Review of Systems  Constitutional: Negative.   HENT: Positive for ear pain and sinus pain.   Eyes: Positive for blurred vision.  Respiratory: Positive for cough and wheezing.   Cardiovascular: Negative.   Gastrointestinal: Positive for abdominal pain.  Genitourinary: Positive for frequency and urgency.  Musculoskeletal: Positive for back pain, joint pain and neck pain.  Skin: Negative.   Neurological: Positive for headaches.  Endo/Heme/Allergies: Negative.    Psychiatric/Behavioral: Negative.     Objective   Vitals:   08/25/17 1112  BP: (!) 139/95  Pulse: 88  Resp: 18  Temp: 98.7 F (37.1 C)    Physical Exam  Constitutional: He is oriented to person, place, and time and well-developed, well-nourished, and in no distress.  HENT:  Head: Normocephalic and atraumatic.  Cardiovascular: Normal rate, regular rhythm and normal heart sounds. Exam reveals no gallop and no friction rub.  No murmur heard. Pulmonary/Chest: Effort normal and breath sounds normal. No respiratory distress. He has no wheezes. He has no rales.  Abdominal: Soft. Bowel sounds are normal. He exhibits no distension. There is no tenderness. There is no rebound.  Reducible right inguinal hernia noted.  Genitourinary:  Genitourinary Comments: Genitourinary examination within normal limits  Neurological: He is alert and oriented to person, place, and time.  Skin: Skin is warm and dry.  Vitals reviewed.  Dr. Josephine Cables notes reviewed. Assessment  Right inguinal hernia Plan   Patient is scheduled for right inguinal herniorrhaphy with mesh on 08/29/2017.  The risks and benefits of the procedure including bleeding, infection, mesh use, and the possibility of recurrence of the hernia were fully explained to the patient, who gave informed consent.

## 2017-08-26 ENCOUNTER — Encounter (HOSPITAL_COMMUNITY): Payer: Self-pay

## 2017-08-26 ENCOUNTER — Encounter (HOSPITAL_COMMUNITY)
Admission: RE | Admit: 2017-08-26 | Discharge: 2017-08-26 | Disposition: A | Discharge status: Home / Self Care | Payer: Medicare Other | Source: Ambulatory Visit | Attending: General Surgery | Admitting: General Surgery

## 2017-08-29 ENCOUNTER — Encounter (HOSPITAL_COMMUNITY): Admission: RE | Payer: Self-pay | Source: Ambulatory Visit

## 2017-08-29 ENCOUNTER — Ambulatory Visit (HOSPITAL_COMMUNITY): Admission: RE | Admit: 2017-08-29 | Payer: Medicare Other | Source: Ambulatory Visit | Admitting: General Surgery

## 2017-08-29 SURGERY — REPAIR, HERNIA, INGUINAL, ADULT
Anesthesia: General | Laterality: Right

## 2017-09-02 NOTE — Patient Instructions (Signed)
Michael Ellison  09/02/2017     @PREFPERIOPPHARMACY @   Your procedure is scheduled on  09/16/2017 .  Report to [facility] at  645   A.M.  Call this number if you have problems the morning of surgery:  [phone]   Remember:  Do not eat food or drink liquids after midnight.  Take these medicines the morning of surgery with A SIP OF WATER  Norvasc, lexapro, toviaz, oxycodone or ultracet. Use your inhalers and your nebulizer before you come. Bring your rescue inhaler with you if you have one.   Do not wear jewelry, make-up or nail polish.  Do not wear lotions, powders, or perfumes, or deoderant.  Do not shave 48 hours prior to surgery.  Men may shave face and neck.  Do not bring valuables to the hospital.  [facility] is not responsible for any belongings or valuables.  Contacts, dentures or bridgework may not be worn into surgery.  Leave your suitcase in the car.  After surgery it may be brought to your room.  For patients admitted to the hospital, discharge time will be determined by your treatment team.  Patients discharged the day of surgery will not be allowed to drive home.   Name and phone number of your driver:   family Special instructions:  None  Please read over the following fact sheets that you were given. Anesthesia Post-op Instructions and Care and Recovery After Surgery       Open Hernia Repair, Adult Open hernia repair is a surgical procedure to fix a hernia. A hernia occurs when an internal organ or tissue pushes out through a weak spot in the abdominal wall muscles. Hernias commonly occur in the groin and around the navel. Most hernias tend to get worse over time. Often, surgery is done to prevent the hernia from becoming bigger, uncomfortable, or an emergency. Emergency surgery may be needed if abdominal contents get stuck in the opening (incarcerated hernia) or the blood supply gets cut off (strangulated hernia). In an open repair, an  incision is made in the abdomen to perform the surgery. Tell a health care provider about:  Any allergies you have.  All medicines you are taking, including vitamins, herbs, eye drops, creams, and over-the-counter medicines.  Any problems you or family members have had with anesthetic medicines.  Any blood or bone disorders you have.  Any surgeries you have had.  Any medical conditions you have, including any recent cold or flu symptoms.  Whether you are pregnant or may be pregnant. What are the risks? Generally, this is a safe procedure. However, problems may occur, including:  Long-lasting (chronic) pain.  Bleeding.  Infection.  Damage to the testicle. This can cause shrinking or swelling.  Damage to the bladder, blood vessels, intestine, or nerves near the hernia.  Trouble passing urine.  Allergic reactions to medicines.  Return of the hernia.  What happens before the procedure? Staying hydrated Follow instructions from your health care provider about hydration, which may include:  Up to 2 hours before the procedure - you may continue to drink clear liquids, such as water, clear fruit juice, black coffee, and plain tea.  Eating and drinking restrictions Follow instructions from your health care provider about eating and drinking, which may include:  8 hours before the procedure - stop eating heavy meals or foods such as meat, fried foods, or fatty foods.  6 hours before the procedure -  stop eating light meals or foods, such as toast or cereal.  6 hours before the procedure - stop drinking milk or drinks that contain milk.  2 hours before the procedure - stop drinking clear liquids.  Medicines  Ask your health care provider about: ? Changing or stopping your regular medicines. This is especially important if you are taking diabetes medicines or blood thinners. ? Taking medicines such as aspirin and ibuprofen. These medicines can thin your blood. Do not take  these medicines before your procedure if your health care provider instructs you not to.  You may be given antibiotic medicine to help prevent infection. General instructions  You may have blood tests or imaging studies.  Ask your health care provider how your surgical site will be marked or identified.  If you smoke, do not smoke for at least 2 weeks before your procedure or for as long as told by your health care provider.  Let your health care provider know if you develop a cold or any infection before your surgery.  Plan to have someone take you home from the hospital or clinic.  If you will be going home right after the procedure, plan to have someone with you for 24 hours. What happens during the procedure?  To reduce your risk of infection: ? Your health care team will wash or sanitize their hands. ? Your skin will be washed with soap. ? Hair may be removed from the surgical area.  An IV tube will be inserted into one of your veins.  You will be given one or more of the following: ? A medicine to help you relax (sedative). ? A medicine to numb the area (local anesthetic). ? A medicine to make you fall asleep (general anesthetic).  Your surgeon will make an incision over the hernia.  The tissues of the hernia will be moved back into place.  The edges of the hernia may be stitched together.  The opening in the abdominal muscles will be closed with stitches (sutures). Or, your surgeon will place a mesh patch made of manmade (synthetic) material over the opening.  The incision will be closed.  A bandage (dressing) may be placed over the incision. The procedure may vary among health care providers and hospitals. What happens after the procedure?  Your blood pressure, heart rate, breathing rate, and blood oxygen level will be monitored until the medicines you were given have worn off.  You may be given medicine for pain.  Do not drive for 24 hours if you received a  sedative. This information is not intended to replace advice given to you by your health care provider. Make sure you discuss any questions you have with your health care provider. Document Released: 03/30/2001 Document Revised: 04/23/2016 Document Reviewed: 03/17/2016 Elsevier Interactive Patient Education  2018 [place], Adult, Care After These instructions give you information about caring for yourself after your procedure. Your doctor may also give you more specific instructions. If you have problems or questions, contact your doctor. Follow these instructions at home: Surgical cut (incision) care   Follow instructions from your doctor about how to take care of your surgical cut area. Make sure you: ? Wash your hands with soap and water before you change your bandage (dressing). If you cannot use soap and water, use hand sanitizer. ? Change your bandage as told by your doctor. ? Leave stitches (sutures), skin glue, or skin tape (adhesive) strips in place. They may need  to stay in place for 2 weeks or longer. If tape strips get loose and curl up, you may trim the loose edges. Do not remove tape strips completely unless your doctor says it is okay.  Check your surgical cut every day for signs of infection. Check for: ? More redness, swelling, or pain. ? More fluid or blood. ? Warmth. ? Pus or a bad smell. Activity  Do not drive or use heavy machinery while taking prescription pain medicine. Do not drive until your doctor says it is okay.  Until your doctor says it is okay: ? Do not lift anything that is heavier than 10 lb (4.5 kg). ? Do not play contact sports.  Return to your normal activities as told by your doctor. Ask your doctor what activities are safe. General instructions  To prevent or treat having a hard time pooping (constipation) while you are taking prescription pain medicine, your doctor may recommend that you: ? Drink enough fluid to keep  your pee (urine) clear or pale yellow. ? Take over-the-counter or prescription medicines. ? Eat foods that are high in fiber, such as fresh fruits and vegetables, whole grains, and beans. ? Limit foods that are high in fat and processed sugars, such as fried and sweet foods.  Take over-the-counter and prescription medicines only as told by your doctor.  Do not take baths, swim, or use a hot tub until your doctor says it is okay.  Keep all follow-up visits as told by your doctor. This is important. Contact a doctor if:  You develop a rash.  You have more redness, swelling, or pain around your surgical cut.  You have more fluid or blood coming from your surgical cut.  Your surgical cut feels warm to the touch.  You have pus or a bad smell coming from your surgical cut.  You have a fever or chills.  You have blood in your poop (stool).  You have not pooped in 2-3 days.  Medicine does not help your pain. Get help right away if:  You have chest pain or you are short of breath.  You feel light-headed.  You feel weak and dizzy (feel faint).  You have very bad pain.  You throw up (vomit) and your pain is worse. This information is not intended to replace advice given to you by your health care provider. Make sure you discuss any questions you have with your health care provider. Document Released: 10/25/2014 Document Revised: 04/23/2016 Document Reviewed: 03/17/2016 Elsevier Interactive Patient Education  2017 [place] Anesthesia, Adult General anesthesia is the use of medicines to make a person "go to sleep" (be unconscious) for a medical procedure. General anesthesia is often recommended when a procedure:  Is long.  Requires you to be still or in an unusual position.  Is major and can cause you to lose blood.  Is impossible to do without general anesthesia.  The medicines used for general anesthesia are called general anesthetics. In addition to making  you sleep, the medicines:  Prevent pain.  Control your blood pressure.  Relax your muscles.  Tell a health care provider about:  Any allergies you have.  All medicines you are taking, including vitamins, herbs, eye drops, creams, and over-the-counter medicines.  Any problems you or family members have had with anesthetic medicines.  Types of anesthetics you have had in the past.  Any bleeding disorders you have.  Any surgeries you have had.  Any medical conditions you have.  Any history of heart or lung conditions, such as heart failure, sleep apnea, or chronic obstructive pulmonary disease (COPD).  Whether you are pregnant or may be pregnant.  Whether you use tobacco, alcohol, marijuana, or street drugs.  Any history of Armed forces logistics/support/administrative officer.  Any history of depression or anxiety. What are the risks? Generally, this is a safe procedure. However, problems may occur, including:  Allergic reaction to anesthetics.  Lung and heart problems.  Inhaling food or liquids from your stomach into your lungs (aspiration).  Injury to nerves.  Waking up during your procedure and being unable to move (rare).  Extreme agitation or a state of mental confusion (delirium) when you wake up from the anesthetic.  Air in the bloodstream, which can lead to stroke.  These problems are more likely to develop if you are having a major surgery or if you have an advanced medical condition. You can prevent some of these complications by answering all of your health care provider's questions thoroughly and by following all pre-procedure instructions. General anesthesia can cause side effects, including:  Nausea or vomiting  A sore throat from the breathing tube.  Feeling cold or shivery.  Feeling tired, washed out, or achy.  Sleepiness or drowsiness.  Confusion or agitation.  What happens before the procedure? Staying hydrated Follow instructions from your health care provider about  hydration, which may include:  Up to 2 hours before the procedure - you may continue to drink clear liquids, such as water, clear fruit juice, black coffee, and plain tea.  Eating and drinking restrictions Follow instructions from your health care provider about eating and drinking, which may include:  8 hours before the procedure - stop eating heavy meals or foods such as meat, fried foods, or fatty foods.  6 hours before the procedure - stop eating light meals or foods, such as toast or cereal.  6 hours before the procedure - stop drinking milk or drinks that contain milk.  2 hours before the procedure - stop drinking clear liquids.  Medicines  Ask your health care provider about: ? Changing or stopping your regular medicines. This is especially important if you are taking diabetes medicines or blood thinners. ? Taking medicines such as aspirin and ibuprofen. These medicines can thin your blood. Do not take these medicines before your procedure if your health care provider instructs you not to. ? Taking new dietary supplements or medicines. Do not take these during the week before your procedure unless your health care provider approves them.  If you are told to take a medicine or to continue taking a medicine on the day of the procedure, take the medicine with sips of water. General instructions   Ask if you will be going home the same day, the following day, or after a longer hospital stay. ? Plan to have someone take you home. ? Plan to have someone stay with you for the first 24 hours after you leave the hospital or clinic.  For 3-6 weeks before the procedure, try not to use any tobacco products, such as cigarettes, chewing tobacco, and e-cigarettes.  You may brush your teeth on the morning of the procedure, but make sure to spit out the toothpaste. What happens during the procedure?  You will be given anesthetics through a mask and through an IV tube in one of your  veins.  You may receive medicine to help you relax (sedative).  As soon as you are asleep, a breathing tube may be used  to help you breathe.  An anesthesia specialist will stay with you throughout the procedure. He or she will help keep you comfortable and safe by continuing to give you medicines and adjusting the amount of medicine that you get. He or she will also watch your blood pressure, pulse, and oxygen levels to make sure that the anesthetics do not cause any problems.  If a breathing tube was used to help you breathe, it will be removed before you wake up. The procedure may vary among health care providers and hospitals. What happens after the procedure?  You will wake up, often slowly, after the procedure is complete, usually in a recovery area.  Your blood pressure, heart rate, breathing rate, and blood oxygen level will be monitored until the medicines you were given have worn off.  You may be given medicine to help you calm down if you feel anxious or agitated.  If you will be going home the same day, your health care provider may check to make sure you can stand, drink, and urinate.  Your health care providers will treat your pain and side effects before you go home.  Do not drive for 24 hours if you received a sedative.  You may: ? Feel nauseous and vomit. ? Have a sore throat. ? Have mental slowness. ? Feel cold or shivery. ? Feel sleepy. ? Feel tired. ? Feel sore or achy, even in parts of your body where you did not have surgery. This information is not intended to replace advice given to you by your health care provider. Make sure you discuss any questions you have with your health care provider. Document Released: 01/11/2008 Document Revised: 03/16/2016 Document Reviewed: 09/18/2015 Elsevier Interactive Patient Education  2018 [place] Anesthesia, Adult, Care After These instructions provide you with information about caring for yourself after your  procedure. Your health care provider may also give you more specific instructions. Your treatment has been planned according to current medical practices, but problems sometimes occur. Call your health care provider if you have any problems or questions after your procedure. What can I expect after the procedure? After the procedure, it is common to have:  Vomiting.  A sore throat.  Mental slowness.  It is common to feel:  Nauseous.  Cold or shivery.  Sleepy.  Tired.  Sore or achy, even in parts of your body where you did not have surgery.  Follow these instructions at home: For at least 24 hours after the procedure:  Do not: ? Participate in activities where you could fall or become injured. ? Drive. ? Use heavy machinery. ? Drink alcohol. ? Take sleeping pills or medicines that cause drowsiness. ? Make important decisions or sign legal documents. ? Take care of children on your own.  Rest. Eating and drinking  If you vomit, drink water, juice, or soup when you can drink without vomiting.  Drink enough fluid to keep your urine clear or pale yellow.  Make sure you have little or no nausea before eating solid foods.  Follow the diet recommended by your health care provider. General instructions  Have a responsible adult stay with you until you are awake and alert.  Return to your normal activities as told by your health care provider. Ask your health care provider what activities are safe for you.  Take over-the-counter and prescription medicines only as told by your health care provider.  If you smoke, do not smoke without supervision.  Keep all follow-up visits  as told by your health care provider. This is important. Contact a health care provider if:  You continue to have nausea or vomiting at home, and medicines are not helpful.  You cannot drink fluids or start eating again.  You cannot urinate after 8-12 hours.  You develop a skin rash.  You have  fever.  You have increasing redness at the site of your procedure. Get help right away if:  You have difficulty breathing.  You have chest pain.  You have unexpected bleeding.  You feel that you are having a life-threatening or urgent problem. This information is not intended to replace advice given to you by your health care provider. Make sure you discuss any questions you have with your health care provider. Document Released: 01/10/2001 Document Revised: 03/08/2016 Document Reviewed: 09/18/2015 Elsevier Interactive Patient Education  [employer].

## 2017-09-12 ENCOUNTER — Encounter (HOSPITAL_COMMUNITY): Payer: Self-pay

## 2017-09-12 ENCOUNTER — Other Ambulatory Visit: Payer: Self-pay

## 2017-09-12 ENCOUNTER — Encounter (HOSPITAL_COMMUNITY)
Admission: RE | Admit: 2017-09-12 | Discharge: 2017-09-12 | Disposition: A | Discharge status: Home / Self Care | Payer: Medicare Other | Source: Ambulatory Visit | Attending: General Surgery | Admitting: General Surgery

## 2017-09-12 DIAGNOSIS — Z01812 Encounter for preprocedural laboratory examination: Secondary | ICD-10-CM | POA: Diagnosis not present

## 2017-09-12 LAB — CBC WITH DIFFERENTIAL/PLATELET
Basophils Absolute: 0 K/uL (ref 0.0–0.1)
Basophils Relative: 0 %
Eosinophils Absolute: 0.2 K/uL (ref 0.0–0.7)
Eosinophils Relative: 1 %
HCT: 48 % (ref 39.0–52.0)
Hemoglobin: 16 g/dL (ref 13.0–17.0)
Lymphocytes Relative: 17 %
Lymphs Abs: 1.9 K/uL (ref 0.7–4.0)
MCH: 27.8 pg (ref 26.0–34.0)
MCHC: 33.3 g/dL (ref 30.0–36.0)
MCV: 83.5 fL (ref 78.0–100.0)
Monocytes Absolute: 0.6 K/uL (ref 0.1–1.0)
Monocytes Relative: 5 %
Neutro Abs: 8.3 K/uL — ABNORMAL HIGH (ref 1.7–7.7)
Neutrophils Relative %: 77 %
Platelets: 245 K/uL (ref 150–400)
RBC: 5.75 MIL/uL (ref 4.22–5.81)
RDW: 14.9 % (ref 11.5–15.5)
WBC: 10.9 K/uL — ABNORMAL HIGH (ref 4.0–10.5)

## 2017-09-12 LAB — BASIC METABOLIC PANEL WITH GFR
Anion gap: 7 (ref 5–15)
BUN: 16 mg/dL (ref 6–20)
CO2: 24 mmol/L (ref 22–32)
Calcium: 9.5 mg/dL (ref 8.9–10.3)
Chloride: 106 mmol/L (ref 101–111)
Creatinine, Ser: 0.92 mg/dL (ref 0.61–1.24)
GFR calc Af Amer: >60 mL/min
GFR calc non Af Amer: >60 mL/min
Glucose, Bld: 90 mg/dL (ref 65–99)
Potassium: 4.2 mmol/L (ref 3.5–5.1)
Sodium: 137 mmol/L (ref 135–145)

## 2017-09-12 LAB — SURGICAL PCR SCREEN
MRSA, PCR: NEGATIVE
Staphylococcus aureus: NEGATIVE

## 2017-09-16 ENCOUNTER — Ambulatory Visit (HOSPITAL_COMMUNITY)
Admission: RE | Admit: 2017-09-16 | Discharge: 2017-09-16 | Disposition: A | Discharge status: Home / Self Care | Payer: Medicare Other | Source: Ambulatory Visit | Attending: General Surgery | Admitting: General Surgery

## 2017-09-16 ENCOUNTER — Encounter (HOSPITAL_COMMUNITY)
Admission: RE | Disposition: A | Discharge status: Home / Self Care | Payer: Self-pay | Source: Ambulatory Visit | Attending: General Surgery

## 2017-09-16 ENCOUNTER — Encounter (HOSPITAL_COMMUNITY): Payer: Self-pay | Admitting: *Deleted

## 2017-09-16 ENCOUNTER — Ambulatory Visit (HOSPITAL_COMMUNITY): Payer: Medicare Other | Admitting: Anesthesiology

## 2017-09-16 DIAGNOSIS — Z7982 Long term (current) use of aspirin: Secondary | ICD-10-CM | POA: Diagnosis not present

## 2017-09-16 DIAGNOSIS — I10 Essential (primary) hypertension: Secondary | ICD-10-CM | POA: Insufficient documentation

## 2017-09-16 DIAGNOSIS — Z9981 Dependence on supplemental oxygen: Secondary | ICD-10-CM | POA: Diagnosis not present

## 2017-09-16 DIAGNOSIS — Z8546 Personal history of malignant neoplasm of prostate: Secondary | ICD-10-CM | POA: Diagnosis not present

## 2017-09-16 DIAGNOSIS — J449 Chronic obstructive pulmonary disease, unspecified: Secondary | ICD-10-CM | POA: Insufficient documentation

## 2017-09-16 DIAGNOSIS — F329 Major depressive disorder, single episode, unspecified: Secondary | ICD-10-CM | POA: Insufficient documentation

## 2017-09-16 DIAGNOSIS — Z79899 Other long term (current) drug therapy: Secondary | ICD-10-CM | POA: Diagnosis not present

## 2017-09-16 DIAGNOSIS — K219 Gastro-esophageal reflux disease without esophagitis: Secondary | ICD-10-CM | POA: Insufficient documentation

## 2017-09-16 DIAGNOSIS — Z87891 Personal history of nicotine dependence: Secondary | ICD-10-CM | POA: Insufficient documentation

## 2017-09-16 DIAGNOSIS — K4090 Unilateral inguinal hernia, without obstruction or gangrene, not specified as recurrent: Secondary | ICD-10-CM

## 2017-09-16 HISTORY — PX: INGUINAL HERNIA REPAIR: SHX194

## 2017-09-16 SURGERY — REPAIR, HERNIA, INGUINAL, ADULT
Anesthesia: Spinal | Laterality: Right

## 2017-09-16 MED ORDER — PHENYLEPHRINE 40 MCG/ML (10ML) SYRINGE FOR IV PUSH (FOR BLOOD PRESSURE SUPPORT)
PREFILLED_SYRINGE | INTRAVENOUS | Status: AC
  Filled 2017-09-16: qty 10

## 2017-09-16 MED ORDER — BUPIVACAINE IN DEXTROSE 0.75-8.25 % IT SOLN
INTRATHECAL | Status: DC | PRN
  Administered 2017-09-16: 2 mL via INTRATHECAL

## 2017-09-16 MED ORDER — CHLORHEXIDINE GLUCONATE CLOTH 2 % EX PADS: 6.0000 | MEDICATED_PAD | Freq: Once | CUTANEOUS | Status: DC

## 2017-09-16 MED ORDER — VANCOMYCIN HCL IN DEXTROSE 1-5 GM/200ML-% IV SOLN
INTRAVENOUS | Status: AC
  Filled 2017-09-16: qty 200

## 2017-09-16 MED ORDER — BUPIVACAINE LIPOSOME 1.3 % IJ SUSP
INTRAMUSCULAR | Status: AC
  Filled 2017-09-16: qty 20

## 2017-09-16 MED ORDER — KETOROLAC TROMETHAMINE 30 MG/ML IJ SOLN
INTRAMUSCULAR | Status: AC
  Filled 2017-09-16: qty 1

## 2017-09-16 MED ORDER — FENTANYL CITRATE (PF) 100 MCG/2ML IJ SOLN
INTRAMUSCULAR | Status: AC
  Filled 2017-09-16: qty 2

## 2017-09-16 MED ORDER — PROPOFOL 10 MG/ML IV BOLUS
INTRAVENOUS | Status: DC | PRN
  Administered 2017-09-16: 10 mg via INTRAVENOUS
  Administered 2017-09-16: 20 mg via INTRAVENOUS
  Administered 2017-09-16: 10 mg via INTRAVENOUS

## 2017-09-16 MED ORDER — KETOROLAC TROMETHAMINE 30 MG/ML IJ SOLN
30.0000 mg | Freq: Once | INTRAMUSCULAR | Status: AC
  Administered 2017-09-16: 30 mg via INTRAVENOUS

## 2017-09-16 MED ORDER — PROPOFOL 10 MG/ML IV BOLUS
INTRAVENOUS | Status: AC
  Filled 2017-09-16: qty 20

## 2017-09-16 MED ORDER — VANCOMYCIN HCL IN DEXTROSE 1-5 GM/200ML-% IV SOLN
1000.0000 mg | INTRAVENOUS | Status: AC
  Administered 2017-09-16: 1000 mg via INTRAVENOUS

## 2017-09-16 MED ORDER — TRAMADOL-ACETAMINOPHEN 37.5-325 MG PO TABS: 1.0000 | ORAL_TABLET | Freq: Four times a day (QID) | ORAL | 0 refills | Status: DC | PRN

## 2017-09-16 MED ORDER — LACTATED RINGERS IV SOLN
INTRAVENOUS | Status: DC
  Administered 2017-09-16: 07:00:00 via INTRAVENOUS

## 2017-09-16 MED ORDER — MIDAZOLAM HCL 2 MG/2ML IJ SOLN
1.0000 mg | INTRAMUSCULAR | Status: AC
  Administered 2017-09-16: 2 mg via INTRAVENOUS

## 2017-09-16 MED ORDER — FENTANYL CITRATE (PF) 100 MCG/2ML IJ SOLN
25.0000 ug | INTRAMUSCULAR | Status: DC | PRN
Start: 2017-09-16 — End: 2017-09-18

## 2017-09-16 MED ORDER — MIDAZOLAM HCL 2 MG/2ML IJ SOLN
INTRAMUSCULAR | Status: AC
  Filled 2017-09-16: qty 2

## 2017-09-16 MED ORDER — MIDAZOLAM HCL 5 MG/5ML IJ SOLN
INTRAMUSCULAR | Status: DC | PRN
  Administered 2017-09-16 (×2): 1 mg via INTRAVENOUS

## 2017-09-16 MED ORDER — FENTANYL CITRATE (PF) 100 MCG/2ML IJ SOLN
INTRAMUSCULAR | Status: DC | PRN
  Administered 2017-09-16: 25 ug via INTRATHECAL
  Administered 2017-09-16: 25 ug via INTRAVENOUS

## 2017-09-16 MED ORDER — PHENYLEPHRINE HCL 10 MG/ML IJ SOLN
INTRAMUSCULAR | Status: DC | PRN
  Administered 2017-09-16: 60 ug via INTRAVENOUS

## 2017-09-16 MED ORDER — PROPOFOL 500 MG/50ML IV EMUL
INTRAVENOUS | Status: DC | PRN
  Administered 2017-09-16: 35 ug/kg/min via INTRAVENOUS

## 2017-09-16 MED ORDER — SODIUM CHLORIDE 0.9 % IR SOLN
Status: DC | PRN
  Administered 2017-09-16: 1000 mL

## 2017-09-16 SURGICAL SUPPLY — 37 items
ADH SKN CLS APL DERMABOND .7 (GAUZE/BANDAGES/DRESSINGS) ×1
BAG HAMPER (MISCELLANEOUS) ×3 IMPLANT
CLOTH BEACON ORANGE TIMEOUT ST (SAFETY) ×3 IMPLANT
COVER LIGHT HANDLE STERIS (MISCELLANEOUS) ×6 IMPLANT
DERMABOND ADVANCED (GAUZE/BANDAGES/DRESSINGS) ×2
DERMABOND ADVANCED .7 DNX12 (GAUZE/BANDAGES/DRESSINGS) ×1 IMPLANT
DRAIN PENROSE 18X1/2 LTX STRL (DRAIN) ×3 IMPLANT
ELECT REM PT RETURN 9FT ADLT (ELECTROSURGICAL) ×3
ELECTRODE REM PT RTRN 9FT ADLT (ELECTROSURGICAL) ×1 IMPLANT
GLOVE BIOGEL PI IND STRL 6.5 (GLOVE) IMPLANT
GLOVE BIOGEL PI IND STRL 7.0 (GLOVE) ×2 IMPLANT
GLOVE BIOGEL PI INDICATOR 6.5 (GLOVE) ×2
GLOVE BIOGEL PI INDICATOR 7.0 (GLOVE) ×4
GLOVE ECLIPSE 7.0 STRL STRAW (GLOVE) ×2 IMPLANT
GLOVE SURG SS PI 6.5 STRL IVOR (GLOVE) ×2 IMPLANT
GLOVE SURG SS PI 7.5 STRL IVOR (GLOVE) ×3 IMPLANT
GOWN STRL REUS W/ TWL XL LVL3 (GOWN DISPOSABLE) ×1 IMPLANT
GOWN STRL REUS W/TWL LRG LVL3 (GOWN DISPOSABLE) ×6 IMPLANT
GOWN STRL REUS W/TWL XL LVL3 (GOWN DISPOSABLE) ×3
INST SET MINOR GENERAL (KITS) ×3 IMPLANT
KIT ROOM TURNOVER APOR (KITS) ×3 IMPLANT
MANIFOLD NEPTUNE II (INSTRUMENTS) ×3 IMPLANT
MESH MARLEX PLUG MEDIUM (Mesh General) ×2 IMPLANT
NDL HYPO 21X1.5 SAFETY (NEEDLE) ×1 IMPLANT
NEEDLE HYPO 21X1.5 SAFETY (NEEDLE) ×3 IMPLANT
NS IRRIG 1000ML POUR BTL (IV SOLUTION) ×3 IMPLANT
PACK MINOR (CUSTOM PROCEDURE TRAY) ×3 IMPLANT
PAD ARMBOARD 7.5X6 YLW CONV (MISCELLANEOUS) ×3 IMPLANT
SET BASIN LINEN APH (SET/KITS/TRAYS/PACK) ×3 IMPLANT
SUT NOVA NAB GS-22 2 2-0 T-19 (SUTURE) ×4 IMPLANT
SUT VIC AB 2-0 CT1 27 (SUTURE) ×3
SUT VIC AB 2-0 CT1 TAPERPNT 27 (SUTURE) ×1 IMPLANT
SUT VIC AB 3-0 SH 27 (SUTURE) ×3
SUT VIC AB 3-0 SH 27X BRD (SUTURE) ×1 IMPLANT
SUT VIC AB 4-0 PS2 27 (SUTURE) ×3 IMPLANT
SUT VICRYL AB 3 0 TIES (SUTURE) ×2 IMPLANT
SYR 20CC LL (SYRINGE) ×3 IMPLANT

## 2017-09-16 NOTE — Op Note (Signed)
Patient:  Michael Ellison  DOB:  06-18-1946  MRN:  [id number]   Preop Diagnosis: Right inguinal hernia  Postop Diagnosis: Same  Procedure: Right inguinal herniorrhaphy with mesh  Surgeon: Aviva Signs, MD  Anes: Spinal  Indications: Patient is a 71 year old black male who presents with a symptomatic right inguinal hernia.  The risks and benefits of the procedure including bleeding, infection, mesh use, and the possibility of recurrence of the hernia were fully explained to the patient, who gave informed consent.  Procedure note: The patient was placed in supine position after spinal anesthesia was administered.  The right groin region was prepped and draped using usual sterile technique with technic care.  Surgical site confirmation was performed.  An incision was made in the right groin region down to the external oblique aponeurosis.  The aponeurosis was incised to the external ring.  A Penrose drain was placed around the spermatic cord.  The vase deferens was noted within the spermatic cord.  The ilioinguinal nerve was identified and retracted inferiorly from the operative field.  The patient was noted to have a lipoma of the cord and this was excised at the peritoneal reflection and tied off using a 3-0 Vicryl suture.  An indirect hernia sac was found.  This was freed away from the spermatic cord up to the peritoneal reflection and inverted.  A medium sized Bard PerFix plug was then inserted.  The floor of the inguinal canal was then repaired using a patch.  The patch was secured to the conjoined tendon superiorly and the shelving edge of Poupart's ligament inferiorly using 2-0 Novafil interrupted sutures.  Of note was the fact that in the pre-tubercle region, a hard knot was found.  I do not know what this item was, but it appeared to be possibly previously placed mesh from ventral hernia surgery.  The external oblique aponeurosis was reapproximated using a 2-0 Vicryl running suture.  The  subcutaneous layer was reapproximated using 3-0 Vicryl interrupted sutures.  Exparel was instilled into the surrounding wound.  The skin was closed using a 4-0 Vicryl subcuticular suture.  Dermabond was applied.  All tape and needle counts were correct at the end of the procedure.  The patient was transferred to PACU in stable condition.  Complications: None  EBL: Minimal  Specimen: None

## 2017-09-16 NOTE — Discharge Instructions (Signed)
Open Hernia Repair, Adult, Care After °These instructions give you information about caring for yourself after your procedure. Your doctor may also give you more specific instructions. If you have problems or questions, contact your doctor. °Follow these instructions at home: °Surgical cut (incision) care  ° °· Follow instructions from your doctor about how to take care of your surgical cut area. Make sure you: °¨ Wash your hands with soap and water before you change your bandage (dressing). If you cannot use soap and water, use hand sanitizer. °¨ Change your bandage as told by your doctor. °¨ Leave stitches (sutures), skin glue, or skin tape (adhesive) strips in place. They may need to stay in place for 2 weeks or longer. If tape strips get loose and curl up, you may trim the loose edges. Do not remove tape strips completely unless your doctor says it is okay. °· Check your surgical cut every day for signs of infection. Check for: °¨ More redness, swelling, or pain. °¨ More fluid or blood. °¨ Warmth. °¨ Pus or a bad smell. °Activity  °· Do not drive or use heavy machinery while taking prescription pain medicine. Do not drive until your doctor says it is okay. °· Until your doctor says it is okay: °¨ Do not lift anything that is heavier than 10 lb (4.5 kg). °¨ Do not play contact sports. °· Return to your normal activities as told by your doctor. Ask your doctor what activities are safe. °General instructions  °· To prevent or treat having a hard time pooping (constipation) while you are taking prescription pain medicine, your doctor may recommend that you: °¨ Drink enough fluid to keep your pee (urine) clear or pale yellow. °¨ Take over-the-counter or prescription medicines. °¨ Eat foods that are high in fiber, such as fresh fruits and vegetables, whole grains, and beans. °¨ Limit foods that are high in fat and processed sugars, such as fried and sweet foods. °· Take over-the-counter and prescription medicines only  as told by your doctor. °· Do not take baths, swim, or use a hot tub until your doctor says it is okay. °· Keep all follow-up visits as told by your doctor. This is important. °Contact a doctor if: °· You develop a rash. °· You have more redness, swelling, or pain around your surgical cut. °· You have more fluid or blood coming from your surgical cut. °· Your surgical cut feels warm to the touch. °· You have pus or a bad smell coming from your surgical cut. °· You have a fever or chills. °· You have blood in your poop (stool). °· You have not pooped in 2-3 days. °· Medicine does not help your pain. °Get help right away if: °· You have chest pain or you are short of breath. °· You feel light-headed. °· You feel weak and dizzy (feel faint). °· You have very bad pain. °· You throw up (vomit) and your pain is worse. °This information is not intended to replace advice given to you by your health care provider. Make sure you discuss any questions you have with your health care provider. °Document Released: 10/25/2014 Document Revised: 04/23/2016 Document Reviewed: 03/17/2016 °Elsevier Interactive Patient Education © 2017 [employer]. ° °

## 2017-09-16 NOTE — Anesthesia Preprocedure Evaluation (Signed)
Anesthesia Evaluation  Patient identified by MRN, date of birth, ID band Patient awake    Reviewed: Allergy & Precautions, H&P , NPO status , Patient's Chart, lab work & pertinent test results  History of Anesthesia Complications Negative for: history of anesthetic complications  Airway Mallampati: II  TM Distance: >3 FB Neck ROM: Limited    Dental  (+) Teeth Intact, Dental Advisory Given, Chipped, Poor Dentition,    Pulmonary shortness of breath, asthma , pneumonia, resolved, COPD,  COPD inhaler and oxygen dependent, former smoker,    breath sounds clear to auscultation       Cardiovascular hypertension, Pt. on medications  Rhythm:Regular Rate:Normal     Neuro/Psych PSYCHIATRIC DISORDERS Depression    GI/Hepatic Neg liver ROS, GERD  Medicated and Controlled,(+)     substance abuse  alcohol use,   Endo/Other  negative endocrine ROS  Renal/GU negative Renal ROS     Musculoskeletal   Abdominal   Peds  Hematology   Anesthesia Other Findings   Reproductive/Obstetrics                             Anesthesia Physical Anesthesia Plan  ASA: IV  Anesthesia Plan: Spinal   Post-op Pain Management:    Induction: Intravenous  PONV Risk Score and Plan:   Airway Management Planned: Simple Face Mask  Additional Equipment:   Intra-op Plan:   Post-operative Plan:   Informed Consent: I have reviewed the patients History and Physical, chart, labs and discussed the procedure including the risks, benefits and alternatives for the proposed anesthesia with the patient or authorized representative who has indicated his/her understanding and acceptance.     Plan Discussed with:   Anesthesia Plan Comments:         Anesthesia Quick Evaluation

## 2017-09-16 NOTE — Anesthesia Postprocedure Evaluation (Signed)
Anesthesia Post Note  Patient: Michael Ellison  Procedure(s) Performed: RIGHT INGUINAL HERNIORRHAPHY  WITH MESH (Right )  Patient location during evaluation: PACU Anesthesia Type: Spinal Level of consciousness: awake and alert and oriented Pain management: pain level controlled Vital Signs Assessment: post-procedure vital signs reviewed and stable Respiratory status: spontaneous breathing Cardiovascular status: blood pressure returned to baseline Postop Assessment: no apparent nausea or vomiting, patient able to bend at knees and spinal receding Anesthetic complications: no     Last Vitals:  Vitals:   09/16/17 0930 09/16/17 0945  BP: (!) 138/94   Pulse: 62 63  Resp: 20 (!) 21  Temp:    SpO2: 96% 96%    Last Pain: There were no vitals filed for this visit.               Sarika Baldini

## 2017-09-16 NOTE — Interval H&P Note (Signed)
History and Physical Interval Note:  09/16/2017 7:16 AM  Michael Ellison  has presented today for surgery, with the diagnosis of right inguinal hernia  The various methods of treatment have been discussed with the patient and family. After consideration of risks, benefits and other options for treatment, the patient has consented to  Procedure(s) with comments: [name] (Right) - pt knows to arrive at 6:15 as a surgical intervention .  The patient's history has been reviewed, patient examined, no change in status, stable for surgery.  I have reviewed the patient's chart and labs.  Questions were answered to the patient's satisfaction.     Aviva Signs

## 2017-09-16 NOTE — Anesthesia Procedure Notes (Addendum)
Spinal  Patient location during procedure: OR Start time: 09/16/2017 7:50 AM Preanesthetic Checklist Completed: patient identified, site marked, surgical consent, pre-op evaluation, timeout performed, IV checked, risks and benefits discussed and monitors and equipment checked Spinal Block Patient position: right lateral decubitus Prep: Betadine Patient monitoring: heart rate, cardiac monitor, continuous pulse ox and blood pressure Approach: right paramedian Location: L3-4 Injection technique: single-shot Needle Needle type: Spinocan  Needle gauge: 22 G Needle length: 9 cm Assessment Sensory level: T8 Additional Notes ATTEMPTS:1 TRAY KK:[id number] TRAY EXPIRATION DATE:08/18/2019

## 2017-09-16 NOTE — Transfer of Care (Signed)
Immediate Anesthesia Transfer of Care Note  Patient: Michael Ellison  Procedure(s) Performed: RIGHT INGUINAL HERNIORRHAPHY  WITH MESH (Right )  Patient Location: PACU  Anesthesia Type:Spinal  Level of Consciousness: awake, alert  and oriented  Airway & Oxygen Therapy: Patient Spontanous Breathing and Patient connected to nasal cannula oxygen  Post-op Assessment: Report given to RN  Post vital signs: Reviewed and stable  Last Vitals:  Vitals:   09/16/17 0725 09/16/17 0857  BP:    Pulse:  72  Resp: (!) 25   Temp:    SpO2: 93% 96%    Last Pain: There were no vitals filed for this visit.    Patients Stated Pain Goal: 9 (33/83/29 1916)  Complications: No apparent anesthesia complications

## 2017-09-19 ENCOUNTER — Encounter (HOSPITAL_COMMUNITY): Payer: Self-pay | Admitting: General Surgery

## 2017-09-19 MED ORDER — BUPIVACAINE LIPOSOME 1.3 % IJ SUSP
INTRAMUSCULAR | Status: DC | PRN
  Administered 2017-09-16: 20 mL

## 2017-09-22 ENCOUNTER — Ambulatory Visit (INDEPENDENT_AMBULATORY_CARE_PROVIDER_SITE_OTHER): Payer: Self-pay | Admitting: General Surgery

## 2017-09-22 ENCOUNTER — Encounter: Payer: Self-pay | Admitting: General Surgery

## 2017-09-22 VITALS — BP 162/93 | HR 87 | Temp 98.9°F | Ht 74.0 in | Wt 198.0 lb

## 2017-09-22 DIAGNOSIS — J449 Chronic obstructive pulmonary disease, unspecified: Secondary | ICD-10-CM | POA: Diagnosis not present

## 2017-09-22 DIAGNOSIS — I10 Essential (primary) hypertension: Secondary | ICD-10-CM | POA: Diagnosis not present

## 2017-09-22 DIAGNOSIS — Z09 Encounter for follow-up examination after completed treatment for conditions other than malignant neoplasm: Secondary | ICD-10-CM

## 2017-09-22 NOTE — Progress Notes (Signed)
Subjective:     Michael Ellison  Status post right inguinal herniorrhaphy with mesh.  Doing well.  Has minimal incisional pain. Objective:    BP (!) 162/93   Pulse 87   Temp 98.9 F (37.2 C)   Ht 6\' 2"  (1.88 m)   Wt 198 lb (89.8 kg)   BMI 25.42 kg/m   General:  alert, cooperative and no distress  Abdomen soft, incision healing well.     Assessment:    Doing well postoperatively.    Plan:   Increase activity as able.  Follow-up here as needed.

## 2017-10-23 DIAGNOSIS — J449 Chronic obstructive pulmonary disease, unspecified: Secondary | ICD-10-CM | POA: Diagnosis not present

## 2017-11-23 DIAGNOSIS — J449 Chronic obstructive pulmonary disease, unspecified: Secondary | ICD-10-CM | POA: Diagnosis not present

## 2017-12-21 DIAGNOSIS — J449 Chronic obstructive pulmonary disease, unspecified: Secondary | ICD-10-CM | POA: Diagnosis not present

## 2017-12-22 DIAGNOSIS — J449 Chronic obstructive pulmonary disease, unspecified: Secondary | ICD-10-CM | POA: Diagnosis not present

## 2017-12-22 DIAGNOSIS — J440 Chronic obstructive pulmonary disease with acute lower respiratory infection: Secondary | ICD-10-CM | POA: Diagnosis not present

## 2017-12-22 DIAGNOSIS — Z1389 Encounter for screening for other disorder: Secondary | ICD-10-CM | POA: Diagnosis not present

## 2017-12-22 DIAGNOSIS — J069 Acute upper respiratory infection, unspecified: Secondary | ICD-10-CM | POA: Diagnosis not present

## 2017-12-22 DIAGNOSIS — Z1331 Encounter for screening for depression: Secondary | ICD-10-CM | POA: Diagnosis not present

## 2017-12-22 DIAGNOSIS — I10 Essential (primary) hypertension: Secondary | ICD-10-CM | POA: Diagnosis not present

## 2017-12-22 DIAGNOSIS — C61 Malignant neoplasm of prostate: Secondary | ICD-10-CM | POA: Diagnosis not present

## 2018-01-21 DIAGNOSIS — J449 Chronic obstructive pulmonary disease, unspecified: Secondary | ICD-10-CM | POA: Diagnosis not present

## 2018-01-25 DIAGNOSIS — I10 Essential (primary) hypertension: Secondary | ICD-10-CM | POA: Diagnosis not present

## 2018-01-25 DIAGNOSIS — J069 Acute upper respiratory infection, unspecified: Secondary | ICD-10-CM | POA: Diagnosis not present

## 2018-01-25 DIAGNOSIS — J449 Chronic obstructive pulmonary disease, unspecified: Secondary | ICD-10-CM | POA: Diagnosis not present

## 2018-02-20 DIAGNOSIS — J449 Chronic obstructive pulmonary disease, unspecified: Secondary | ICD-10-CM | POA: Diagnosis not present

## 2018-03-23 DIAGNOSIS — J449 Chronic obstructive pulmonary disease, unspecified: Secondary | ICD-10-CM | POA: Diagnosis not present

## 2018-04-22 DIAGNOSIS — J449 Chronic obstructive pulmonary disease, unspecified: Secondary | ICD-10-CM | POA: Diagnosis not present

## 2018-04-27 DIAGNOSIS — I10 Essential (primary) hypertension: Secondary | ICD-10-CM | POA: Diagnosis not present

## 2018-04-27 DIAGNOSIS — J441 Chronic obstructive pulmonary disease with (acute) exacerbation: Secondary | ICD-10-CM | POA: Diagnosis not present

## 2018-04-27 DIAGNOSIS — R42 Dizziness and giddiness: Secondary | ICD-10-CM | POA: Diagnosis not present

## 2018-05-23 DIAGNOSIS — J449 Chronic obstructive pulmonary disease, unspecified: Secondary | ICD-10-CM | POA: Diagnosis not present

## 2018-07-03 DIAGNOSIS — I10 Essential (primary) hypertension: Secondary | ICD-10-CM | POA: Diagnosis not present

## 2018-07-03 DIAGNOSIS — J441 Chronic obstructive pulmonary disease with (acute) exacerbation: Secondary | ICD-10-CM | POA: Diagnosis not present

## 2018-07-26 ENCOUNTER — Emergency Department (HOSPITAL_COMMUNITY)
Admission: EM | Admit: 2018-07-26 | Discharge: 2018-07-26 | Disposition: A | Discharge status: Home / Self Care | Payer: Medicare Other | Attending: Emergency Medicine | Admitting: Emergency Medicine

## 2018-07-26 ENCOUNTER — Emergency Department (HOSPITAL_COMMUNITY): Payer: Medicare Other

## 2018-07-26 ENCOUNTER — Other Ambulatory Visit: Payer: Self-pay

## 2018-07-26 ENCOUNTER — Encounter (HOSPITAL_COMMUNITY): Payer: Self-pay | Admitting: *Deleted

## 2018-07-26 DIAGNOSIS — R05 Cough: Secondary | ICD-10-CM | POA: Diagnosis not present

## 2018-07-26 DIAGNOSIS — R0602 Shortness of breath: Secondary | ICD-10-CM | POA: Diagnosis not present

## 2018-07-26 DIAGNOSIS — R0789 Other chest pain: Secondary | ICD-10-CM | POA: Insufficient documentation

## 2018-07-26 DIAGNOSIS — J449 Chronic obstructive pulmonary disease, unspecified: Secondary | ICD-10-CM | POA: Insufficient documentation

## 2018-07-26 DIAGNOSIS — Z23 Encounter for immunization: Secondary | ICD-10-CM | POA: Diagnosis not present

## 2018-07-26 DIAGNOSIS — Z79899 Other long term (current) drug therapy: Secondary | ICD-10-CM | POA: Insufficient documentation

## 2018-07-26 DIAGNOSIS — Z87891 Personal history of nicotine dependence: Secondary | ICD-10-CM | POA: Insufficient documentation

## 2018-07-26 DIAGNOSIS — I10 Essential (primary) hypertension: Secondary | ICD-10-CM | POA: Diagnosis not present

## 2018-07-26 LAB — TROPONIN I: Troponin I: <0.03 ng/mL

## 2018-07-26 LAB — CBC WITH DIFFERENTIAL/PLATELET
Abs Immature Granulocytes: 0.03 K/uL (ref 0.00–0.07)
Basophils Absolute: 0.1 K/uL (ref 0.0–0.1)
Basophils Relative: 1 %
Eosinophils Absolute: 0.3 K/uL (ref 0.0–0.5)
Eosinophils Relative: 4 %
HCT: 47.3 % (ref 39.0–52.0)
Hemoglobin: 15.4 g/dL (ref 13.0–17.0)
Immature Granulocytes: 0 %
Lymphocytes Relative: 19 %
Lymphs Abs: 1.3 K/uL (ref 0.7–4.0)
MCH: 27.3 pg (ref 26.0–34.0)
MCHC: 32.6 g/dL (ref 30.0–36.0)
MCV: 83.9 fL (ref 80.0–100.0)
Monocytes Absolute: 0.4 K/uL (ref 0.1–1.0)
Monocytes Relative: 6 %
Neutro Abs: 4.8 K/uL (ref 1.7–7.7)
Neutrophils Relative %: 70 %
Platelets: 221 K/uL (ref 150–400)
RBC: 5.64 MIL/uL (ref 4.22–5.81)
RDW: 14.8 % (ref 11.5–15.5)
WBC: 6.9 K/uL (ref 4.0–10.5)
nRBC: 0 % (ref 0.0–0.2)

## 2018-07-26 LAB — BRAIN NATRIURETIC PEPTIDE: B Natriuretic Peptide: 86 pg/mL (ref 0.0–100.0)

## 2018-07-26 LAB — BASIC METABOLIC PANEL
ANION GAP: 8 (ref 5–15)
BUN: 16 mg/dL (ref 8–23)
CALCIUM: 8.6 mg/dL — AB (ref 8.9–10.3)
CO2: 21 mmol/L — ABNORMAL LOW (ref 22–32)
Chloride: 110 mmol/L (ref 98–111)
Creatinine, Ser: 0.83 mg/dL (ref 0.61–1.24)
Glucose, Bld: 102 mg/dL — ABNORMAL HIGH (ref 70–99)
Potassium: 3.7 mmol/L (ref 3.5–5.1)
SODIUM: 139 mmol/L (ref 135–145)

## 2018-07-26 LAB — D-DIMER, QUANTITATIVE (NOT AT ARMC)

## 2018-07-26 MED ORDER — METHYLPREDNISOLONE SODIUM SUCC 125 MG IJ SOLR
125.0000 mg | Freq: Once | INTRAMUSCULAR | Status: AC
  Administered 2018-07-26: 125 mg via INTRAVENOUS
  Filled 2018-07-26: qty 2

## 2018-07-26 MED ORDER — LEVALBUTEROL HCL 0.63 MG/3ML IN NEBU
0.6300 mg | INHALATION_SOLUTION | Freq: Once | RESPIRATORY_TRACT | Status: AC
  Administered 2018-07-26: 0.63 mg via RESPIRATORY_TRACT
  Filled 2018-07-26: qty 3

## 2018-07-26 MED ORDER — PREDNISONE 20 MG PO TABS: ORAL_TABLET | ORAL | 0 refills | Status: DC

## 2018-07-26 NOTE — ED Provider Notes (Signed)
[facility] EMERGENCY DEPARTMENT Provider Note   CSN: [id number] Arrival date & time: 07/26/18  1120     History   Chief Complaint Chief Complaint  Patient presents with  . Shortness of Breath    HPI Michael Ellison is a 72 y.o. male.  HPI Patient presents with 2 to 3 days of worsening shortness of breath.  Shortness of breath is worse with lying flat and exertion.  Has minimal nonproductive cough.  States he has episodic chest pressure but none currently.  He wakes in the normal night short of breath with palpitations.  Was seen in his primary 54 office today and referred to the emergency department for evaluation.  He states he has mild lower extremity swelling.  No fever or chills. Past Medical History:  Diagnosis Date  . Arthritis   . Aspiration pneumonia ([place]) 03/01/2012  . Asthma   . Cancer ([place])   . Cancer of prostate ([place])   . COPD (chronic obstructive pulmonary disease) (H[facility])    home O2   PRN    . Depression   . GERD (gastroesophageal reflux disease)   . Hernia of abdominal cavity   . Hypertension   . On home O2    3L prn  . Shortness of breath     Patient Active Problem List   Diagnosis Date Noted  . Non-recurrent unilateral inguinal hernia without obstruction or gangrene   . Ventral hernia without obstruction or gangrene 08/26/2016  . Constipation 05/05/2016  . Encounter for screening colonoscopy 05/05/2016  . SOB (shortness of breath) 03/26/2015  . Chronic respiratory failure with hypoxia ([place]) 03/26/2015  . Depression   . Essential hypertension 04/17/2014  . Acute on chronic respiratory failure with hypoxia ([place]) 04/17/2014  . Recurrent ventral hernia 03/19/2014  . Anemia 01/03/2013  . Incisional hernia s/p open repair with biologic mesh 12/25/2012 11/21/2012  . COPD (chronic obstructive pulmonary disease) ([place]) 06/23/2012  . Alcohol abuse 03/01/2012    Past Surgical History:  Procedure Laterality Date  . BOWEL RESECTION  02/25/2012   Procedure: SMALL BOWEL RESECTION;  Surgeon: Donato Heinz, MD;  Location: [facility];  Service: General;;  . COLONOSCOPY N/A 05/27/2016   Procedure: COLONOSCOPY;  Surgeon: Daneil Dolin, MD;  Location: [facility];  Service: Endoscopy;  Laterality: N/A;  200  . HERNIA REPAIR    . INCISION AND DRAINAGE ABSCESS N/A 01/15/2013   Procedure: INCISION AND DRAINAGE Abdominal Wall ABSCESS;  Surgeon: Gwenyth Ober, MD;  Location: [place];  Service: General;  Laterality: N/A;  . INGUINAL HERNIA REPAIR Right 09/16/2017   Procedure: RIGHT INGUINAL HERNIORRHAPHY  WITH MESH;  Surgeon: Aviva Signs, MD;  Location: [facility];  Service: General;  Laterality: Right;  . INSERTION OF MESH N/A 12/25/2012   Procedure: INSERTION OF MESH;  Surgeon: Gwenyth Ober, MD;  Location: [place];  Service: General;  Laterality: N/A;  . INSERTION OF MESH N/A 08/26/2016   Procedure: INSERTION OF MESH;  Surgeon: Judeth Horn, MD;  Location: [place];  Service: General;  Laterality: N/A;  . LAPAROTOMY  02/25/2012   Procedure: EXPLORATORY LAPAROTOMY;  Surgeon: Donato Heinz, MD;  Location: [facility];  Service: General;  Laterality: N/A;  . POLYPECTOMY  05/27/2016   Procedure: POLYPECTOMY;  Surgeon: Daneil Dolin, MD;  [facility];  Service: Endoscopy;;  Descending colon polyp and Sigmoid colon polyp removed via hot snare  . PROSTATE BIOPSY    . robotic prostate surgery  2011  .  TRACHEOSTOMY  03/2012  . VENTRAL HERNIA REPAIR  12/25/2012    Dr Hulen Skains  . VENTRAL HERNIA REPAIR N/A 12/25/2012   Procedure: HERNIA REPAIR VENTRAL ADULT;  Surgeon: Gwenyth Ober, MD;  Location: [place];  Service: General;  Laterality: N/A;  . VENTRAL HERNIA REPAIR N/A 08/26/2016   Procedure: VENTRAL INCISIONAL HERNIA REPAIR WITH MESH;  Surgeon: Judeth Horn, MD;  Location: [place];  Service: General;  Laterality: N/A;  . WOUND DEBRIDEMENT  03/02/2012   Procedure: DEBRIDEMENT CLOSURE/ABDOMINAL WOUND;  Surgeon: Gwenyth Ober, MD;  Location: [place];  Service: General;   Laterality: N/A;  [facility] DIVERTICULECTOMY  2013   Dr. Redmond Baseman        Home Medications    Prior to Admission medications   Medication Sig Start Date End Date Taking? Authorizing Provider  acetaminophen (TYLENOL) 500 MG tablet Take 500 mg by mouth every 6 (six) hours as needed for headache.    Yes [provider]  budesonide-formoterol (SYMBICORT) 160-4.5 MCG/ACT inhaler Inhale 2 puffs into the lungs 2 (two) times daily. Patient taking differently: Inhale 2 puffs daily into the lungs.  04/20/14  Yes Thurnell Lose, MD  ibuprofen (ADVIL,MOTRIN) 200 MG tablet Take 400 mg every 6 (six) hours as needed by mouth for headache or moderate pain.   Yes [provider]  ipratropium-albuterol (DUONEB) 0.5-2.5 (3) MG/3ML SOLN Take 3 mLs by nebulization every 6 (six) hours as needed (for shortness of breath/wheezing).    Yes [provider]  lisinopril (PRINIVIL,ZESTRIL) 20 MG tablet Take 20 mg daily by mouth.   Yes [provider]  tamsulosin (FLOMAX) 0.4 MG CAPS capsule Take 1 capsule by mouth daily. 07/05/18  Yes [provider]  albuterol (PROVENTIL HFA;VENTOLIN HFA) 108 (90 Base) MCG/ACT inhaler Inhale 2 puffs into the lungs every 4 (four) hours as needed for wheezing or shortness of breath. Patient not taking: Reported on 07/26/2018 11/14/15   Francine Graven, DO  predniSONE (DELTASONE) 20 MG tablet 3 tabs po day one, then 2 po daily x 4 days 07/27/18   Julianne Rice, MD  traMADol-acetaminophen (ULTRACET) 37.5-325 MG tablet Take 1 tablet by mouth every 6 (six) hours as needed for moderate pain or severe pain. Patient not taking: Reported on 07/26/2018 09/16/17   Aviva Signs, MD    Family History Family History  Problem Relation Age of Onset  . Pseudochol deficiency Neg Hx   . Anesthesia problems Neg Hx   . Hypotension Neg Hx   . Malignant hyperthermia Neg Hx   . Colon cancer Neg Hx     Social History Social History   Tobacco Use  .  Smoking status: Former Smoker    Packs/day: 1.50    Years: 44.00    Pack years: 66.00    Types: Cigarettes    Last attempt to quit: 02/22/2012    Years since quitting: 6.4  . Smokeless tobacco: Never Used  . Tobacco comment: Quit x 4 years  Substance Use Topics  . Alcohol use: No    Alcohol/week: 0.0 standard drinks    Comment: sober since ~ 02/2012  . Drug use: No     Allergies   Lorazepam   Review of Systems Review of Systems  Constitutional: Negative for chills and fever.  Respiratory: Positive for cough and shortness of breath. Negative for wheezing.   Cardiovascular: Positive for chest pain and leg swelling. Negative for palpitations.  Gastrointestinal: Negative for abdominal pain, constipation, diarrhea, nausea and vomiting.  Musculoskeletal:  Negative for back pain, myalgias and neck pain.  Skin: Negative for rash and wound.  Neurological: Negative for dizziness, weakness, light-headedness, numbness and headaches.  All other systems reviewed and are negative.    Physical Exam Updated Vital Signs BP (!) 186/119 (BP Location: Right Arm)   Pulse 81   Temp 98 F (36.7 C) (Oral)   Resp 19   Ht 6\' 3"  (1.905 m)   Wt 93.4 kg   SpO2 95%   BMI 25.75 kg/m   Physical Exam  Constitutional: He is oriented to person, place, and time. He appears well-developed and well-nourished.  HENT:  Head: Normocephalic and atraumatic.  Mouth/Throat: Oropharynx is clear and moist.  Eyes: Pupils are equal, round, and reactive to light. EOM are normal.  Neck: Normal range of motion. Neck supple. JVD present.  Cardiovascular: Normal rate and regular rhythm.  Pulmonary/Chest: Effort normal. He has rales.  Few crackles in bilateral bases.  End expiratory wheezing in bilateral upper lung fields.  No respiratory distress.  Abdominal: Soft. Bowel sounds are normal. There is no tenderness. There is no rebound and no guarding.  Musculoskeletal: Normal range of motion. He exhibits edema. He  exhibits no tenderness.  1+ bilateral lower extremity edema.  No asymmetry or tenderness.  Distal pulses intact.  Neurological: He is alert and oriented to person, place, and time.  Moving all extremities without focal deficit.  Sensation fully intact.  Skin: Skin is warm and dry. Capillary refill takes less than 2 seconds. No rash noted. No erythema.  Psychiatric: He has a normal mood and affect. His behavior is normal.  Nursing note and vitals reviewed.    ED Treatments / Results  Labs (all labs ordered are listed, but only abnormal results are displayed) Labs Reviewed  BASIC METABOLIC PANEL - Abnormal; Notable for the following components:      Result Value   CO2 21 (*)    Glucose, Bld 102 (*)    Calcium 8.6 (*)    All other components within normal limits  CBC WITH DIFFERENTIAL/PLATELET  TROPONIN I  BRAIN NATRIURETIC PEPTIDE  D-DIMER, QUANTITATIVE (NOT AT [facility])  TROPONIN I    EKG EKG Interpretation  Date/Time:  Wednesday July 26 2018 11:32:08 EDT Ventricular Rate:  92 PR Interval:    QRS Duration: 93 QT Interval:  361 QTC Calculation: 447 R Axis:   -11 Text Interpretation:  Sinus rhythm Ventricular premature complex RSR' in V1 or V2, probably normal variant Left ventricular hypertrophy Inferior infarct, old Confirmed by Virgel Manifold [phone]) on 07/26/2018 11:38:05 AM   Radiology Dg Chest 2 View  Result Date: 07/26/2018 CLINICAL DATA:  Shortness of breath EXAM: CHEST - 2 VIEW COMPARISON:  08/29/2016 FINDINGS: Large lung volumes with bullous emphysematous changes at the apices. Generalized interstitial coarsening, likely bronchitic. Blunting of the lateral right costophrenic sulcus likely from hyperinflation; no pleural fluid seen on the lateral view. Borderline heart size. Stable mediastinal contours. IMPRESSION: COPD without acute superimposed finding. Electronically Signed   By: Monte Fantasia M.D.   On: 07/26/2018 12:57    Procedures Procedures (including  critical care time)  Medications Ordered in ED Medications  levalbuterol (XOPENEX) nebulizer solution 0.63 mg (0.63 mg Nebulization Given 07/26/18 1353)  methylPREDNISolone sodium succinate (SOLU-MEDROL) 125 mg/2 mL injection 125 mg (125 mg Intravenous Given 07/26/18 1400)     Initial Impression / Assessment and Plan / ED Course  I have reviewed the triage vital signs and the nursing notes.  Pertinent labs & imaging  results that were available during my care of the patient were reviewed by me and considered in my medical decision making (see chart for details).     Patient states he is feeling much better after Xopenex treatment.  Ambulating around the emergency department without dyspnea or hypoxia.  Chest x-ray without acute findings.  Normal BNP and d-dimer.  Troponin x2 is normal.  Suspect symptoms may be related to COPD exacerbation.  Will give short course of steroids and have follow-up closely with his primary physician.  Return precautions given.  Final Clinical Impressions(s) / ED Diagnoses   Final diagnoses:  Shortness of breath  Atypical chest pain    ED Discharge Orders         Ordered    predniSONE (DELTASONE) 20 MG tablet     07/26/18 1617           Julianne Rice, MD 07/26/18 1619

## 2018-07-26 NOTE — ED Triage Notes (Signed)
Pt was seen by Dr. Legrand Rams today for SOB x 1 month. Dr. Legrand Rams sent pt to ED for evaluation of tachycardia. Pt's HR in office was 110. Pt c/o feeling "like my heart is racing" along with nausea and lightheadedness. Pt reports he sometimes gets this feeling but usually at night. Denies cough, fever.

## 2018-08-02 DIAGNOSIS — J441 Chronic obstructive pulmonary disease with (acute) exacerbation: Secondary | ICD-10-CM | POA: Diagnosis not present

## 2018-08-02 DIAGNOSIS — R0789 Other chest pain: Secondary | ICD-10-CM | POA: Diagnosis not present

## 2018-08-02 DIAGNOSIS — R0602 Shortness of breath: Secondary | ICD-10-CM | POA: Diagnosis not present

## 2018-08-04 ENCOUNTER — Other Ambulatory Visit: Payer: Self-pay

## 2018-08-04 NOTE — Patient Outreach (Signed)
[facility]) Care Management  08/04/2018  [name] [birth year] [id number]   Medication Adherence call to Mr. Lejend Dalby left a message for patient to call back patient is due on Lisinopril 20 mg.Mr.Abram is showing past due under [place].   [place] Management Direct Dial [phone]  Fax [phone] [email or web address]

## 2018-11-03 DIAGNOSIS — I10 Essential (primary) hypertension: Secondary | ICD-10-CM | POA: Diagnosis not present

## 2018-11-03 DIAGNOSIS — G64 Other disorders of peripheral nervous system: Secondary | ICD-10-CM | POA: Diagnosis not present

## 2018-11-03 DIAGNOSIS — K432 Incisional hernia without obstruction or gangrene: Secondary | ICD-10-CM | POA: Diagnosis not present

## 2018-11-03 DIAGNOSIS — J441 Chronic obstructive pulmonary disease with (acute) exacerbation: Secondary | ICD-10-CM | POA: Diagnosis not present

## 2018-12-14 ENCOUNTER — Other Ambulatory Visit: Payer: Self-pay

## 2018-12-14 DIAGNOSIS — J441 Chronic obstructive pulmonary disease with (acute) exacerbation: Secondary | ICD-10-CM | POA: Diagnosis not present

## 2018-12-14 NOTE — Patient Outreach (Signed)
[facility]) Care Management  12/14/2018  [name] 04/27/1946 [id number]   Telephone Screen  Referral Date:12/14/2018 Referral Source: MD office Referral Reason: "COPD, can't afford breathing meds" Insurance: [facility] Medicare   Outreach attempt # 1 to patient. Spoke with patient and screening completed.   Patient states that he moved in with his mother to assist her with her care needs. He voices that he is independent with ADLs/IADLs. He drives himself to MD appts. He denies any recent falls or use of assistive devices. Per chart review, patient has PMH of arthritis, asthma, prostate CA, COP, depression,, GERD an HTN. Patient voices that he is managing his conditions. He is taking neb txs and using oxygen as needed. He voiced that last night he had a "bad breathing spell." He took neb tx and placed oxygen on along with elevated bed with pillows and got some relief. He voices that he went to PCP appt today for follow up. Patient states he was given script for Symbicort last week and went to get med filled and it was about $129.00. He was unable to afford it. While at MD office today -MD provided patient with sample of Trelegy. He states he took a dose and got good relief from it. He is unsure which inhaler he will remain on as both of them are costly. He voices MD only had sample of Trelegy and not Symbicort. Patient voices that he was also started on antibiotic today for possible resp. Infection. RN CM reviewed with patient ways to manage SOB especially at night. Patient voiced understanding and sounded knowledgeable regarding COPD as he has had it for some time. Patient agreeable and gave verbal consent for pharmacy referral for possible med assistance. He denies any other [facility] needs or concerns at this time.    Plan: RN CM will send [facility] pharmacy referral for possible med assistance.   Michael Montgomery, RN,BSN,CCM [place] Management Telephonic Care Management  Coordinator Direct Phone: [phone] Toll Free: [phone] Fax: [phone]

## 2018-12-20 ENCOUNTER — Ambulatory Visit: Payer: Self-pay | Admitting: Pharmacist

## 2018-12-21 ENCOUNTER — Other Ambulatory Visit: Payer: Self-pay | Admitting: Pharmacy Technician

## 2018-12-21 ENCOUNTER — Other Ambulatory Visit: Payer: Self-pay | Admitting: Pharmacist

## 2018-12-21 NOTE — Patient Outreach (Signed)
[facility]) Care Management  12/21/2018  Michael Ellison [birth year] [id number]                          Medication Assistance Referral  Referral From: [facility] RPh Jenne Pane  Medication/Company: Ruthe Mannan and Proventil HFA / [employer]  Patient application portion:  Education officer, museum portion:  Mailed to Dr. Legrand Rams  Medication/Company: [employer] / [employer] Patient application portion:  Mailed Provider application portion: Faxed  to Dr. Legrand Rams   Follow up:  Will follow up with patient in 5-7 business days to confirm application(s) have been received.  Maud Deed Chana Bode [place] Certified Pharmacy Technician [place] Management Direct Dial:[phone]

## 2018-12-21 NOTE — Patient Outreach (Signed)
Michael Ellison [facility]) Care Management  [place]   12/21/2018  [name] March 25, 1946 [id number]  Reason for referral: Medication Assistance with inhalers  Referral source: [facility] RN Current insurance:[employer]  PMHx includes but not limited to:  HTN, COPD, depression  Outreach:  Successful telephone call with [name].  HIPAA identifiers verified.   **New Address** [address] [place] , [place] [address]  Patient agreeable to PAPs through [employer].  He states his sample of Trelegy is working well for him, therefore will check with PCP to mimic equivalent Ruthe Mannan, Spiriva).  He is also in need of an albuterol rescue.  He states his copays are all over $100 and he cannot afford this.  Informed patient of increased wait times with [employer].  He states he will continue to receive samples from Dr. Legrand Rams until PAP approved.  Medications reviewed and updated in Epic.  Patient denies further pharmacy assistance at this time.   Objective: Lab Results  Component Value Date   CREATININE 0.83 07/26/2018   CREATININE 0.92 09/12/2017   CREATININE 1.10 09/01/2016    Lab Results  Component Value Date   HGBA1C 5.5 08/29/2016    Lipid Panel     Component Value Date/Time   CHOL 82 03/13/2012 0500   TRIG 100 03/14/2012 0435    BP Readings from Last 3 Encounters:  07/26/18 (!) 171/87  09/22/17 (!) 162/93  09/16/17 124/88    Allergies  Allergen Reactions  . Lorazepam Other (See Comments)    SEVERE DELERIUM AGITATION Tolerates midazolam and other benzo's    Medications Reviewed Today    Reviewed by [name], [facility] (Pharmacist) on 12/21/18 at 1112  Med List Status: <None>  Medication Order Taking? Sig Documenting Provider Last Dose Status Informant  acetaminophen (TYLENOL) 500 MG tablet [id number] Yes Take 500 mg by mouth every 6 (six) hours as needed for headache.  [provider] Taking Active Self  albuterol (PROVENTIL HFA;VENTOLIN  HFA) 108 (90 Base) MCG/ACT inhaler [id number] Yes Inhale 2 puffs into the lungs every 4 (four) hours as needed for wheezing or shortness of breath. Francine Graven, DO Taking Active Self  budesonide-formoterol [facility]) 160-4.5 MCG/ACT inhaler [id number]  Inhale 2 puffs into the lungs 2 (two) times daily.  Patient taking differently:  Inhale 2 puffs daily into the lungs.    Thurnell Lose, MD  Active Self           Med Note Blanca Friend, Sherian Maroon Dec 21, 2018 11:11 AM) Not affordable $120, not taking   ibuprofen (ADVIL,MOTRIN) 200 MG tablet [id number]  Take 400 mg every 6 (six) hours as needed by mouth for headache or moderate pain. [provider]  Active Self  ipratropium-albuterol (DUONEB) 0.5-2.5 (3) MG/3ML SOLN [id number] Yes Take 3 mLs by nebulization every 6 (six) hours as needed (for shortness of breath/wheezing).  [provider] Taking Active Self           Med Note (Oakley Orban D   Thu Dec 21, 2018 11:11 AM) Uses as needed   lisinopril (PRINIVIL,ZESTRIL) 20 MG tablet [id number] Yes Take 20 mg daily by mouth. [provider] Taking Active Self  predniSONE (DELTASONE) 20 MG tablet [id number] Yes 3 tabs po day one, then 2 po daily x 4 days Julianne Rice, MD Taking Active   tamsulosin [facility]) 0.4 MG CAPS capsule [id number] Yes Take 1 capsule by mouth daily. [provider] Taking Active Self  traMADol-acetaminophen (ULTRACET) 37.5-325 MG  tablet [id number] Yes Take 1 tablet by mouth every 6 (six) hours as needed for moderate pain or severe pain. Aviva Signs, MD Taking Active Self           Medication Assistance Findings:  Extra Help:   _0  Already receiving Full Extra Help  _1  Already receiving Partial Extra Help  _2  Eligible based on reported income and assets  _3  Not Eligible based on reported income and assets  Patient Assistance Programs: 1) Spiriva made by BI o Income requirement met: _4  Yes _5  No _6  Unknown o Out-of-pocket  prescription expenditure met:    _7  Yes _8  No  _9  Unknown  <IXMDEKIYJGZQJSID>_3<\/FPKGYBNLWHKNZUDO>_72  Not applicable         2)  Dulera & Proventil made by [employer] o Income requirement met: _11  Yes _12  No  _13  Unknown o Out-of-pocket prescription expenditure met:   _14  Yes _15  No   _16  Unknown <VHQITUYWXIPPNDLO>_3<\/RAFOADLKZGFUQXAF>_58  Not applicable -  Plan: I will route patient assistance letter to [place] technician who will coordinate patient assistance program application process for medications listed above.  [facility] pharmacy technician will assist with obtaining all required documents from both patient and provider(s) and submit application(s) once completed.    Regina Eck, PharmD, [place]  [phone]

## 2018-12-22 DIAGNOSIS — J449 Chronic obstructive pulmonary disease, unspecified: Secondary | ICD-10-CM | POA: Diagnosis not present

## 2018-12-28 DIAGNOSIS — Z0001 Encounter for general adult medical examination with abnormal findings: Secondary | ICD-10-CM | POA: Diagnosis not present

## 2018-12-28 DIAGNOSIS — I10 Essential (primary) hypertension: Secondary | ICD-10-CM | POA: Diagnosis not present

## 2018-12-28 DIAGNOSIS — Z1389 Encounter for screening for other disorder: Secondary | ICD-10-CM | POA: Diagnosis not present

## 2018-12-28 DIAGNOSIS — J449 Chronic obstructive pulmonary disease, unspecified: Secondary | ICD-10-CM | POA: Diagnosis not present

## 2019-01-08 ENCOUNTER — Other Ambulatory Visit: Payer: Self-pay | Admitting: Pharmacy Technician

## 2019-01-08 NOTE — Patient Outreach (Signed)
[place] [facility]) Care Management  01/08/2019  Michael Ellison 06/09/1946 [id number]    Successful call placed to patient regarding patient assistance application(s) for Dulera, Proventil HFA and Spiriva , HIPAA identifiers verified. Mr. Kleinsasser states he has received applications but has not sent them back in due to receiving samples from Provider's office. Encouraged him to fill out applications and mail them back in due to Covid-19 possibly preventing providers offices from receiving samples. He states he understands and will mail them back in as soon as he can.   Follow up:  Will follow up with patient in 14-21 business days if documents have not been received.  Maud Deed Chana Bode McGregor Certified Pharmacy Technician [place] Management Direct Dial:[phone]

## 2019-01-11 ENCOUNTER — Other Ambulatory Visit: Payer: Self-pay

## 2019-01-11 ENCOUNTER — Ambulatory Visit: Payer: Self-pay | Admitting: Pharmacist

## 2019-01-11 ENCOUNTER — Other Ambulatory Visit: Payer: Self-pay | Admitting: Pharmacist

## 2019-01-11 NOTE — Patient Outreach (Signed)
.  [facility]) Care Management  [place]  01/11/2019  Michael Ellison 1945-11-06 [id number]   Reason for call: Medication assistance (attempting to apply for inhalers-- Helyn Numbers, Albuterol for COPD)  Outreach:  Care coordination call to Dr. Josephine Cables office regarding inhalers.  Dr. Legrand Rams stated that [name] has tried Ruthe Mannan in the past, but he was not satisfied with results.   Dr. Legrand Rams states that he has achieved better outcomes using Trelegy inhaler samples. Successful call to patient with HIPAA identifiers verified.  [facility] Pharmacist discussed inhalers with patient again to find best option.  He does state that Trelegy has worked best for him.  He is in agreement to apply for Proventil (albuterol rescue inhaler) only at this time.  Patient states that he only uses about once weekly.  We will attempt to apply for Trelegy once patient has met OOP for [place].   Plan:  Will route note to [facility] CPhT, Etter Sjogren who assisting with patient   Regina Eck, PharmD, [place]  [phone]

## 2019-01-22 DIAGNOSIS — J449 Chronic obstructive pulmonary disease, unspecified: Secondary | ICD-10-CM | POA: Diagnosis not present

## 2019-02-21 DIAGNOSIS — J449 Chronic obstructive pulmonary disease, unspecified: Secondary | ICD-10-CM | POA: Diagnosis not present

## 2019-03-14 DIAGNOSIS — I10 Essential (primary) hypertension: Secondary | ICD-10-CM | POA: Diagnosis not present

## 2019-03-14 DIAGNOSIS — J449 Chronic obstructive pulmonary disease, unspecified: Secondary | ICD-10-CM | POA: Diagnosis not present

## 2019-03-21 ENCOUNTER — Other Ambulatory Visit: Payer: Self-pay | Admitting: Pharmacist

## 2019-03-21 ENCOUNTER — Ambulatory Visit: Payer: Self-pay | Admitting: Pharmacist

## 2019-03-21 NOTE — Patient Outreach (Signed)
[place] [facility]) Care Management  [place]   03/21/2019  Michael Ellison Sep 17, 1946 [id number]  Reason for referral: Medication Assistance, Medication Management  Referral source: [facility] RN Current insurance: [facility]  PMHx includes but not limited to:  COPD, HTN, BPH  Outreach:  Successful telephone call with Michael Ellison.  HIPAA identifiers verified.  Patient states he is doing "really good".  Regarding COPD, he denies SOB or wheezing.  No recent PO steroid/ABx use.  He continues to use his Trelegy once daily as prescribed and supplied by Dr. Legrand Rams (patient getting samples).  He also has his albuterol rescue inhaler that he uses 1-2 times per week.  When patient gets "winded" or feels uncontrolled by maintenance inhalers, he uses his nebulizer (albuterol/ipratropium) and gets relief.  Counseled patient on continued daily use of maintenance inhaler (Trelegy).  Patient and Dr. Legrand Rams do not wish to switch inhaler regimen at this time as patient is best controlled.  He has been on Spiriva/Symbicort in the past, however patient has little to no out of pocket spend to qualify for these patient assistance programs.  Will discuss with [facility] CPhT, Etter Sjogren regarding plan to not apply for patient assistance at this time.  Patient appreciative of call.  Will continue to follow COPD.   Lab Results  Component Value Date   CREATININE 0.83 07/26/2018   CREATININE 0.92 09/12/2017   CREATININE 1.10 09/01/2016    Lab Results  Component Value Date   HGBA1C 5.5 08/29/2016    Medications Reviewed Today    Reviewed by Michael Ellison, [facility] (Pharmacist) on 03/21/19 at 1024  Med List Status: <None>  Medication Order Taking? Sig Documenting Provider Last Dose Status Informant  acetaminophen (TYLENOL) 500 MG tablet [id number] Yes Take 500 mg by mouth every 6 (six) hours as needed for headache.  [provider] Taking Active Self  albuterol (PROVENTIL HFA;VENTOLIN  HFA) 108 (90 Base) MCG/ACT inhaler [id number] Yes Inhale 2 puffs into the lungs every 4 (four) hours as needed for wheezing or shortness of breath. Francine Graven, DO Taking Active Self  Fluticasone-Umeclidin-Vilant (TRELEGY ELLIPTA) 100-62.5-25 MCG/INH AEPB [id number] Yes Inhale into the lungs. [provider] Taking Active            Med Note Blanca Friend, [place] Kyler D   Wed Mar 21, 2019 10:23 AM) Getting samples from Dr. Legrand Rams  ibuprofen (ADVIL,MOTRIN) 200 MG tablet [id number] Yes Take 400 mg every 6 (six) hours as needed by mouth for headache or moderate pain. [provider] Taking Active Self  ipratropium-albuterol (DUONEB) 0.5-2.5 (3) MG/3ML SOLN [id number] Yes Take 3 mLs by nebulization every 6 (six) hours as needed (for shortness of breath/wheezing).  [provider] Taking Active Self           Med Note (Beckam Abdulaziz D   Thu Dec 21, 2018 11:11 AM) Uses as needed   lisinopril (PRINIVIL,ZESTRIL) 20 MG tablet [id number] Yes Take 20 mg daily by mouth. [provider] Taking Active Self  tamsulosin (FLOMAX) 0.4 MG CAPS capsule [id number] Yes Take 1 capsule by mouth daily. [provider] Taking Active Self  traMADol-acetaminophen (ULTRACET) 37.5-325 MG tablet [id number] Yes Take 1 tablet by mouth every 6 (six) hours as needed for moderate pain or severe pain. Aviva Signs, MD Taking Active Self          Plan: I will continue to follow up with patient quarterly  Regina Eck, PharmD, [place]  [phone]      

## 2019-03-24 DIAGNOSIS — J449 Chronic obstructive pulmonary disease, unspecified: Secondary | ICD-10-CM | POA: Diagnosis not present

## 2019-04-13 ENCOUNTER — Other Ambulatory Visit: Payer: Self-pay

## 2019-04-13 NOTE — Patient Outreach (Signed)
[place] [facility]) Care Management  04/13/2019  CACHE BILLS 05/03/1946 [id number]    Telephone Screen Referral Date : 04/12/2019 Referral Source: [facility] High Risk Referral Reason: Screening Insurance: [facility]  Outreach attempt # 1 to patient.  Mother answer the phone and states that the patient was not at home.  HIPAA compliant voicemail left with contact information.    Plan: RN Health Coach will make an outreach attempt to the patient within four business days.    Lazaro Arms RN, BSN, [place] Direct Dial:  [phone] Fax: [phone]

## 2019-04-17 ENCOUNTER — Other Ambulatory Visit: Payer: Self-pay

## 2019-04-17 NOTE — Patient Outreach (Signed)
[place] [facility] Care Management  04/17/2019  DRAYDON CLAIRMONT 05/23/1946 [id number]      Telephone Screen Referral Date : 04/12/2019 Referral Source: [facility] High Risk Referral Reason: Screening Insurance: [facility]  2nd unsuccessful attempt to outreach the patient for screening.  Mother answered the phone and stated that he was not at home.  HIPAA compliant information given with contact number.  Plan:  RN Health Coach will make an outreach attempt to the patient within four business days.   Lazaro Arms RN, BSN, [place] Direct Dial:  [phone]  Fax: [phone]

## 2019-04-19 NOTE — Progress Notes (Signed)
This encounter was created in error - please disregard.

## 2019-04-23 ENCOUNTER — Other Ambulatory Visit: Payer: Self-pay

## 2019-04-23 DIAGNOSIS — J449 Chronic obstructive pulmonary disease, unspecified: Secondary | ICD-10-CM | POA: Diagnosis not present

## 2019-04-23 NOTE — Patient Outreach (Signed)
[place] [facility]) Care Management  04/23/2019  Michael Ellison May 12, 1946 [id number]     Telephone Screen Referral Date :04/12/2019 Referral Source:[employer] High Risk Referral Reason:Screening Insurance:[employer]  3rd outreach attempt to the patient.  No answer.  Unable to leave a message due to no voicemail pick up after six rings.  Plan: Outreach attempt has been made. If no response to calls and letter in ten business days [place] will proceed with case closure.    Lazaro Arms RN, BSN, [place] Direct Dial:  [phone]  Fax: [phone]

## 2019-05-04 ENCOUNTER — Other Ambulatory Visit: Payer: Self-pay

## 2019-05-04 NOTE — Patient Outreach (Signed)
[place] [facility]) Care Management  05/04/2019  FIELDING MAULT 08-Jul-1946 [id number]    Multiple attempts to establish contact with the patient without success. No response from calls or letter mailed to patient. Case is being closed at this time.   Lazaro Arms RN, BSN, [place] Direct Dial:  [phone]  Fax: [phone]

## 2019-05-17 DIAGNOSIS — I10 Essential (primary) hypertension: Secondary | ICD-10-CM | POA: Diagnosis not present

## 2019-05-17 DIAGNOSIS — J449 Chronic obstructive pulmonary disease, unspecified: Secondary | ICD-10-CM | POA: Diagnosis not present

## 2019-05-23 ENCOUNTER — Encounter: Payer: Self-pay | Admitting: Internal Medicine

## 2019-05-24 DIAGNOSIS — J449 Chronic obstructive pulmonary disease, unspecified: Secondary | ICD-10-CM | POA: Diagnosis not present

## 2019-05-29 ENCOUNTER — Ambulatory Visit: Payer: Self-pay | Admitting: Pharmacist

## 2019-06-01 ENCOUNTER — Ambulatory Visit: Payer: Self-pay | Admitting: Pharmacist

## 2019-06-07 ENCOUNTER — Ambulatory Visit (INDEPENDENT_AMBULATORY_CARE_PROVIDER_SITE_OTHER): Payer: Self-pay | Admitting: *Deleted

## 2019-06-07 ENCOUNTER — Other Ambulatory Visit: Payer: Self-pay

## 2019-06-07 DIAGNOSIS — Z86010 Personal history of colonic polyps: Secondary | ICD-10-CM

## 2019-06-07 MED ORDER — PEG 3350-KCL-NA BICARB-NACL 420 G PO SOLR: 4000.0000 mL | Freq: Once | ORAL | 0 refills | Status: AC

## 2019-06-07 NOTE — Progress Notes (Signed)
Is he on Tramadol long-term?

## 2019-06-07 NOTE — Progress Notes (Signed)
Pt says that Tramadol is not long term use and he only takes it once or twice a week.

## 2019-06-07 NOTE — Progress Notes (Signed)
Pt remembered the aspirin when asked about blood thinners.  Added to medication list.

## 2019-06-07 NOTE — Patient Instructions (Signed)
Michael Ellison   1946/04/03 MRN: [id number]    Procedure Date: 08/10/2019 Time to register: 9:30 am Place to register: [facility] Procedure Time: 10:30 am Scheduled provider: Dr. Gala Romney  PREPARATION FOR COLONOSCOPY WITH TRI-LYTE SPLIT PREP  Please notify us immediately if you are diabetic, take iron supplements, or if you are on Coumadin or any other blood thinners.   You will need to purchase 1 fleet enema and 1 box of Bisacodyl 12m tablets.   2 DAYS BEFORE PROCEDURE:  DATE: 08/08/2019  DAY: Wednesday Begin clear liquid diet AFTER your lunch meal. NO SOLID FOODS after this point.  1 DAY BEFORE PROCEDURE:  DATE: 08/09/2019  DAY: Thursday Continue clear liquids the entire day - NO SOLID FOOD.    At 2:00 pm:  Take 2 Bisacodyl tablets.   At 4:00pm:  Start drinking your solution. Make sure you mix well per instructions on the bottle. Try to drink 1 (one) 8 ounce glass every 10-15 minutes until you have consumed HALF the jug. You should complete by 6:00pm.You must keep the left over solution refrigerated until completed next day.  Continue clear liquids. You must drink plenty of clear liquids to prevent dehyration and kidney failure.     DAY OF PROCEDURE:   DATE: 08/10/2019   DAY: Friday If you take medications for your heart, blood pressure or breathing, you may take these medications.    Five hours before your procedure time @ 5:30 am:  Finish remaining amout of bowel prep, drinking 1 (one) 8 ounce glass every 10-15 minutes until complete. You have two hours to consume remaining prep.   Three hours before your procedure time @ 7:30am:  Nothing by mouth.   At least one hour before going to the hospital:  Give yourself one Fleet enema. You may take your morning medications with sip of water unless we have instructed otherwise.      Please see below for Dietary Information.  CLEAR LIQUIDS INCLUDE:  Water Jello (NOT red in color)   Ice Popsicles (NOT red in  color)   Tea (sugar ok, no milk/cream) Powdered fruit flavored drinks  Coffee (sugar ok, no milk/cream) Gatorade/ Lemonade/ Kool-Aid  (NOT red in color)   Juice: apple, white grape, white cranberry Soft drinks  Clear bullion, consomme, broth (fat free beef/chicken/vegetable)  Carbonated beverages (any kind)  Strained chicken noodle soup Hard Candy   Remember: Clear liquids are liquids that will allow you to see your fingers on the other side of a clear glass. Be sure liquids are NOT red in color, and not cloudy, but CLEAR.  DO NOT EAT OR DRINK ANY OF THE FOLLOWING:  Dairy products of any kind   Cranberry juice Tomato juice / V8 juice   Grapefruit juice Orange juice     Red grape juice  Do not eat any solid foods, including such foods as: cereal, oatmeal, yogurt, fruits, vegetables, creamed soups, eggs, bread, crackers, pureed foods in a blender, etc.   HELPFUL HINTS FOR DRINKING PREP SOLUTION:   Make sure prep is extremely cold. Mix and refrigerate the the morning of the prep. You may also put in the freezer.   You may try mixing some Crystal Light or Country Time Lemonade if you prefer. Mix in small amounts; add more if necessary.  Try drinking through a straw  Rinse mouth with water or a mouthwash between glasses, to remove after-taste.  Try sipping on a cold beverage /ice/ popsicles between glasses of prep.  Place a piece of sugar-free hard candy in mouth between glasses.  If you become nauseated, try consuming smaller amounts, or stretch out the time between glasses. Stop for 30-60 minutes, then slowly start back drinking.        OTHER INSTRUCTIONS  You will need a responsible adult at least 72 years of age to accompany you and drive you home. This person must remain in the waiting room during your procedure. The hospital will cancel your procedure if you do not have a responsible adult with you.   1. Wear loose fitting clothing that is easily removed. 2. Leave jewelry  and other valuables at home.  3. Remove all body piercing jewelry and leave at home. 4. Total time from sign-in until discharge is approximately 2-3 hours. 5. You should go home directly after your procedure and rest. You can resume normal activities the day after your procedure. 6. The day of your procedure you should not:  Drive  Make legal decisions  Operate machinery  Drink alcohol  Return to work   You may call the office (Dept: [phone]) before 5:00pm, or page the doctor on call [phone]) after 5:00pm, for further instructions, if necessary.   Insurance Information YOU WILL NEED TO CHECK WITH YOUR INSURANCE COMPANY FOR THE BENEFITS OF COVERAGE YOU HAVE FOR THIS PROCEDURE.  UNFORTUNATELY, NOT ALL INSURANCE COMPANIES HAVE BENEFITS TO COVER ALL OR PART OF THESE TYPES OF PROCEDURES.  IT IS YOUR RESPONSIBILITY TO CHECK YOUR BENEFITS, HOWEVER, WE WILL BE GLAD TO ASSIST YOU WITH ANY CODES YOUR INSURANCE COMPANY MAY NEED.    PLEASE NOTE THAT MOST INSURANCE COMPANIES WILL NOT COVER A SCREENING COLONOSCOPY FOR PEOPLE UNDER THE AGE OF 50  IF YOU HAVE BCBS INSURANCE, YOU MAY HAVE BENEFITS FOR A SCREENING COLONOSCOPY BUT IF POLYPS ARE FOUND THE DIAGNOSIS WILL CHANGE AND THEN YOU MAY HAVE A DEDUCTIBLE THAT WILL NEED TO BE MET. SO PLEASE MAKE SURE YOU CHECK YOUR BENEFITS FOR A SCREENING COLONOSCOPY AS WELL AS A DIAGNOSTIC COLONOSCOPY.

## 2019-06-07 NOTE — Progress Notes (Signed)
Gastroenterology Pre-Procedure Review  Request Date: 06/07/2019 Requesting Physician: 3 year recall, Last TCS 05/27/2016 done by Dr. Gala Romney, tubular adenoma  PATIENT REVIEW QUESTIONS: The patient responded to the following health history questions as indicated:    1. Diabetes Melitis: No 2. Joint replacements in the past 12 months: No 3. Major health problems in the past 3 months: No 4. Has an artificial valve or MVP: No 5. Has a defibrillator: No 6. Has been advised in past to take antibiotics in advance of a procedure like teeth cleaning: No 7. Family history of colon cancer: No  8. Alcohol Use: No 9. History of sleep apnea: No  10. History of coronary artery or other vascular stents placed within the last 12 months: No 11. History of any prior anesthesia complications: No    MEDICATIONS & ALLERGIES:    Patient reports the following regarding taking any blood thinners:   Plavix? No Aspirin? Yes, once or twice a month Coumadin? No Brilinta? No Xarelto? No Eliquis? No Pradaxa? No Savaysa? No Effient? No  Patient confirms/reports the following medications:  Current Outpatient Medications  Medication Sig Dispense Refill  . acetaminophen (TYLENOL) 500 MG tablet Take 500 mg by mouth every 6 (six) hours as needed for headache.     . albuterol (PROVENTIL HFA;VENTOLIN HFA) 108 (90 Base) MCG/ACT inhaler Inhale 2 puffs into the lungs every 4 (four) hours as needed for wheezing or shortness of breath. 1 Inhaler 0  . Fluticasone-Umeclidin-Vilant (TRELEGY ELLIPTA) 100-62.5-25 MCG/INH AEPB Inhale into the lungs.    . gabapentin (NEURONTIN) 300 MG capsule Take 300 mg by mouth as needed.    Marland Kitchen ibuprofen (ADVIL,MOTRIN) 200 MG tablet Take 400 mg every 6 (six) hours as needed by mouth for headache or moderate pain.    Marland Kitchen ipratropium-albuterol (DUONEB) 0.5-2.5 (3) MG/3ML SOLN Take 3 mLs by nebulization every 6 (six) hours as needed (for shortness of breath/wheezing).     Marland Kitchen lisinopril  (PRINIVIL,ZESTRIL) 20 MG tablet Take 20 mg daily by mouth.    . tamsulosin (FLOMAX) 0.4 MG CAPS capsule Take 1 capsule by mouth daily.    . traMADol-acetaminophen (ULTRACET) 37.5-325 MG tablet Take 1 tablet by mouth every 6 (six) hours as needed for moderate pain or severe pain. 25 tablet 0   No current facility-administered medications for this visit.     Patient confirms/reports the following allergies:  Allergies  Allergen Reactions  . Lorazepam Other (See Comments)    SEVERE DELERIUM AGITATION Tolerates midazolam and other benzo's    No orders of the defined types were placed in this encounter.   AUTHORIZATION INFORMATION Primary Insurance: [employer] Medicare,  ID #: [id number] ,  Group #: [id number] Pre-Cert / Josem Kaufmann required: No, not required  SCHEDULE INFORMATION: Procedure has been scheduled as follows:  Date: 08/10/2019, Time: 10:30 Location: [facility] with Dr. Gala Romney  This Gastroenterology Pre-Precedure Review Form is being routed to the following provider(s): Roseanne Kaufman, NP

## 2019-06-11 NOTE — Progress Notes (Signed)
I feel he would be better served with office visit for Propofol. Thanks!

## 2019-06-12 ENCOUNTER — Encounter: Payer: Self-pay | Admitting: *Deleted

## 2019-06-12 NOTE — Progress Notes (Signed)
SCHEDULED PATIENT FOR OFFICE VISIT  

## 2019-06-12 NOTE — Progress Notes (Signed)
Letter mailed to patient with appointment information and procedure cancellation.

## 2019-06-17 DIAGNOSIS — J449 Chronic obstructive pulmonary disease, unspecified: Secondary | ICD-10-CM | POA: Diagnosis not present

## 2019-06-17 DIAGNOSIS — I10 Essential (primary) hypertension: Secondary | ICD-10-CM | POA: Diagnosis not present

## 2019-06-24 DIAGNOSIS — J449 Chronic obstructive pulmonary disease, unspecified: Secondary | ICD-10-CM | POA: Diagnosis not present

## 2019-06-27 DIAGNOSIS — Z23 Encounter for immunization: Secondary | ICD-10-CM | POA: Diagnosis not present

## 2019-07-11 ENCOUNTER — Other Ambulatory Visit: Payer: Self-pay | Admitting: *Deleted

## 2019-07-11 ENCOUNTER — Encounter: Payer: Self-pay | Admitting: Gastroenterology

## 2019-07-11 ENCOUNTER — Other Ambulatory Visit: Payer: Self-pay

## 2019-07-11 ENCOUNTER — Ambulatory Visit: Payer: Medicare Other | Admitting: Gastroenterology

## 2019-07-11 ENCOUNTER — Encounter: Payer: Self-pay | Admitting: *Deleted

## 2019-07-11 VITALS — BP 167/93 | HR 88 | Temp 96.9°F | Ht 74.5 in | Wt 200.8 lb

## 2019-07-11 DIAGNOSIS — Z86010 Personal history of colonic polyps: Secondary | ICD-10-CM

## 2019-07-11 DIAGNOSIS — K5900 Constipation, unspecified: Secondary | ICD-10-CM

## 2019-07-11 DIAGNOSIS — Z860101 Personal history of adenomatous and serrated colon polyps: Secondary | ICD-10-CM | POA: Insufficient documentation

## 2019-07-11 MED ORDER — PEG 3350-KCL-NA BICARB-NACL 420 G PO SOLR: 4000.0000 mL | Freq: Once | ORAL | 0 refills | Status: AC

## 2019-07-11 NOTE — Patient Instructions (Signed)
1. Colonoscopy as scheduled.  Please see separate instructions. 2. Take MiraLAX 17 g daily as needed to maintain regular soft bowel movements. It is important to take Miralax regularly especially the 1-2 weeks prior to your colonoscopy to make sure your bowel movements are regular and soft. This will help with making sure you get cleaned out well for your colonoscopy.

## 2019-07-11 NOTE — Assessment & Plan Note (Signed)
Pleasant 73 year old gentleman with history of adenomatous colon polyps, last colonoscopy 3 years ago, presenting for surveillance colonoscopy.  Denies any GI symptoms.  History of COPD, uses intermittent oxygen at home along with inhalers as needed but rarely has to use.  Denies any significant dyspnea on exertion.  Remote alcohol abuse.  Tramadol a couple times per week.  Plan for deep sedation with anesthesiology assistance.  I have discussed the risks, alternatives, benefits with regards to but not limited to the risk of reaction to medication, bleeding, infection, perforation and the patient is agreeable to proceed. Written consent to be obtained.  Regarding constipation, we will have him take MiraLAX 1 capful daily.

## 2019-07-11 NOTE — Progress Notes (Signed)
Primary Care Physician:  Rosita Fire, MD  Primary Gastroenterologist:  Garfield Cornea, MD   Chief Complaint  Patient presents with  . Colonoscopy    consult    HPI:  Michael Ellison is a 73 y.o. male here for consideration of surveillance colonoscopy.  Last colonoscopy August 2017 with Dr. Gala Romney, he had a 14 mm sigmoid tubular adenoma removed, 8 mm descending colon tubular adenoma removed.  Advised next colonoscopy 3 years.    Clinically doing well. Occasionally uses home oxygen, inhalers but not often.  No significant DOE.  Some intermittent constipation. Takes miralax as needed, about 3 times per week. BM every other day or so. No melena, brbpr. No vomiting, heartburn, dysphagia. No abdominal pain.   Takes tramadol couple of times per week.   Current Outpatient Medications  Medication Sig Dispense Refill  . acetaminophen (TYLENOL) 500 MG tablet Take 500 mg by mouth every 6 (six) hours as needed for headache.     . albuterol (PROVENTIL HFA;VENTOLIN HFA) 108 (90 Base) MCG/ACT inhaler Inhale 2 puffs into the lungs every 4 (four) hours as needed for wheezing or shortness of breath. 1 Inhaler 0  . aspirin EC 81 MG tablet Take 81 mg by mouth as needed. Takes 1 to 2 a month.    . Fluticasone-Umeclidin-Vilant (TRELEGY ELLIPTA) 100-62.5-25 MCG/INH AEPB Inhale into the lungs as needed.     . gabapentin (NEURONTIN) 300 MG capsule Take 300 mg by mouth as needed.    Marland Kitchen ibuprofen (ADVIL,MOTRIN) 200 MG tablet Take 400 mg every 6 (six) hours as needed by mouth for headache or moderate pain.    Marland Kitchen ipratropium-albuterol (DUONEB) 0.5-2.5 (3) MG/3ML SOLN Take 3 mLs by nebulization every 6 (six) hours as needed (for shortness of breath/wheezing).     Marland Kitchen lisinopril (PRINIVIL,ZESTRIL) 20 MG tablet Take 20 mg daily by mouth.    . tamsulosin (FLOMAX) 0.4 MG CAPS capsule Take 1 capsule by mouth daily.    . traMADol-acetaminophen (ULTRACET) 37.5-325 MG tablet Take 1 tablet by mouth every 6 (six) hours as needed  for moderate pain or severe pain. 25 tablet 0   No current facility-administered medications for this visit.     Allergies as of 07/11/2019 - Review Complete 07/11/2019  Allergen Reaction Noted  . Lorazepam Other (See Comments) 02/27/2012    Past Medical History:  Diagnosis Date  . Arthritis   . Aspiration pneumonia ([place]) 03/01/2012  . Asthma   . Cancer ([place])   . Cancer of prostate ([place])   . COPD (chronic obstructive pulmonary disease) ([facility])    home O2   PRN    . Depression   . GERD (gastroesophageal reflux disease)   . Hernia of abdominal cavity   . Hypertension   . On home O2    3L prn  . Shortness of breath     Past Surgical History:  Procedure Laterality Date  . BOWEL RESECTION  02/25/2012   Procedure: SMALL BOWEL RESECTION;  Surgeon: Donato Heinz, MD;  Location: [facility];  Service: General;;  . COLONOSCOPY N/A 05/27/2016   Dr.Rourk: 14 mm polyp in the sigmoid colon removed, tubular adenoma.  8 mm polyp in the descending colon removed, tubular adenoma.  Diverticulosis.  Recommend colonoscopy 3 years  . HERNIA REPAIR    . INCISION AND DRAINAGE ABSCESS N/A 01/15/2013   Procedure: INCISION AND DRAINAGE Abdominal Wall ABSCESS;  Surgeon: Gwenyth Ober, MD;  Location: [place];  Service: General;  Laterality: N/A;  . INGUINAL  HERNIA REPAIR Right 09/16/2017   Procedure: RIGHT INGUINAL HERNIORRHAPHY  WITH MESH;  Surgeon: Aviva Signs, MD;  Location: [facility];  Service: General;  Laterality: Right;  . INSERTION OF MESH N/A 12/25/2012   Procedure: INSERTION OF MESH;  Surgeon: Gwenyth Ober, MD;  Location: [place];  Service: General;  Laterality: N/A;  . INSERTION OF MESH N/A 08/26/2016   Procedure: INSERTION OF MESH;  Surgeon: Judeth Horn, MD;  Location: [place];  Service: General;  Laterality: N/A;  . LAPAROTOMY  02/25/2012   Procedure: EXPLORATORY LAPAROTOMY;  Surgeon: Donato Heinz, MD;  Location: [facility];  Service: General;  Laterality: N/A;  . POLYPECTOMY  05/27/2016   Procedure:  POLYPECTOMY;  Surgeon: Daneil Dolin, MD;  [facility];  Service: Endoscopy;;  Descending colon polyp and Sigmoid colon polyp removed via hot snare  . PROSTATE BIOPSY    . robotic prostate surgery  2011  . TRACHEOSTOMY  03/2012  . VENTRAL HERNIA REPAIR  12/25/2012    Dr Hulen Skains  . VENTRAL HERNIA REPAIR N/A 12/25/2012   Procedure: HERNIA REPAIR VENTRAL ADULT;  Surgeon: Gwenyth Ober, MD;  Location: [place];  Service: General;  Laterality: N/A;  . VENTRAL HERNIA REPAIR N/A 08/26/2016   Procedure: VENTRAL INCISIONAL HERNIA REPAIR WITH MESH;  Surgeon: Judeth Horn, MD;  Location: [place];  Service: General;  Laterality: N/A;  . WOUND DEBRIDEMENT  03/02/2012   Procedure: DEBRIDEMENT CLOSURE/ABDOMINAL WOUND;  Surgeon: Gwenyth Ober, MD;  Location: [place];  Service: General;  Laterality: N/A;  [facility] DIVERTICULECTOMY  2013   Dr. Redmond Baseman    Family History  Problem Relation Age of Onset  . Pseudochol deficiency Neg Hx   . Anesthesia problems Neg Hx   . Hypotension Neg Hx   . Malignant hyperthermia Neg Hx   . Colon cancer Neg Hx     Social History   Socioeconomic History  . Marital status: Divorced    Spouse name: Not on file  . Number of children: Not on file  . Years of education: Not on file  . Highest education level: Not on file  Occupational History  . Occupation: retired    Comment: Lawyer  . Financial resource strain: Not on file  . Food insecurity    Worry: Not on file    Inability: Not on file  . Transportation needs    Medical: Not on file    Non-medical: Not on file  Tobacco Use  . Smoking status: Former Smoker    Packs/day: 1.50    Years: 44.00    Pack years: 66.00    Types: Cigarettes    Quit date: 02/22/2012    Years since quitting: 7.3  . Smokeless tobacco: Never Used  . Tobacco comment: Quit x 4 years  Substance and Sexual Activity  . Alcohol use: No    Alcohol/week: 0.0 standard drinks    Comment: sober since ~ 02/2012  . Drug use: No  .  Sexual activity: Never  Lifestyle  . Physical activity    Days per week: Not on file    Minutes per session: Not on file  . Stress: Not on file  Relationships  . Social Herbalist on phone: Not on file    Gets together: Not on file    Attends religious service: Not on file    Active member of club or organization: Not on file    Attends meetings of clubs  or organizations: Not on file    Relationship status: Not on file  . Intimate partner violence    Fear of current or ex partner: Not on file    Emotionally abused: Not on file    Physically abused: Not on file    Forced sexual activity: Not on file  Other Topics Concern  . Not on file  Social History Narrative  . Not on file      ROS:  General: Negative for anorexia, weight loss, fever, chills, fatigue, weakness. Eyes: Negative for vision changes.  ENT: Negative for hoarseness, difficulty swallowing , nasal congestion. CV: Negative for chest pain, angina, palpitations, dyspnea on exertion, peripheral edema.  Respiratory: Negative for dyspnea at rest, dyspnea on exertion, cough, sputum, wheezing.  GI: See history of present illness. GU:  Negative for dysuria, hematuria, urinary incontinence, urinary frequency, nocturnal urination.  MS: Negative for joint pain, low back pain.  Derm: Negative for rash or itching.  Neuro: Negative for weakness, abnormal sensation, seizure, frequent headaches, memory loss, confusion.  Psych: Negative for anxiety, depression, suicidal ideation, hallucinations.  Endo: Negative for unusual weight change.  Heme: Negative for bruising or bleeding. Allergy: Negative for rash or hives.    Physical Examination:  BP (!) 167/93   Pulse 88   Temp (!) 96.9 F (36.1 C) (Temporal)   Ht 6' 2.5" (1.892 m)   Wt 200 lb 12.8 oz (91.1 kg)   BMI 25.44 kg/m    General: Well-nourished, well-developed in no acute distress.  Head: Normocephalic, atraumatic.   Eyes: Conjunctiva pink, no  icterus. Mouth: masked Neck: Supple without thyromegaly, masses, or lymphadenopathy.  Lungs: Clear to auscultation bilaterally.  Heart: Regular rate and rhythm, no murmurs rubs or gallops.  Abdomen: Bowel sounds are normal, nontender, nondistended, no hepatosplenomegaly or masses, no abdominal bruits or    hernia , no rebound or guarding.   Rectal: deferred Extremities: No lower extremity edema. No clubbing or deformities.  Neuro: Alert and oriented x 4 , grossly normal neurologically.  Skin: Warm and dry, no rash or jaundice.   Psych: Alert and cooperative, normal mood and affect.    Imaging Studies: No results found.

## 2019-07-18 DIAGNOSIS — J449 Chronic obstructive pulmonary disease, unspecified: Secondary | ICD-10-CM | POA: Diagnosis not present

## 2019-07-18 DIAGNOSIS — I10 Essential (primary) hypertension: Secondary | ICD-10-CM | POA: Diagnosis not present

## 2019-07-24 DIAGNOSIS — J449 Chronic obstructive pulmonary disease, unspecified: Secondary | ICD-10-CM | POA: Diagnosis not present

## 2019-08-07 ENCOUNTER — Other Ambulatory Visit (HOSPITAL_COMMUNITY): Payer: Medicare Other

## 2019-08-17 DIAGNOSIS — J069 Acute upper respiratory infection, unspecified: Secondary | ICD-10-CM | POA: Diagnosis not present

## 2019-08-17 DIAGNOSIS — J449 Chronic obstructive pulmonary disease, unspecified: Secondary | ICD-10-CM | POA: Diagnosis not present

## 2019-08-17 DIAGNOSIS — I10 Essential (primary) hypertension: Secondary | ICD-10-CM | POA: Diagnosis not present

## 2019-08-24 ENCOUNTER — Other Ambulatory Visit: Payer: Self-pay | Admitting: Pharmacist

## 2019-08-24 DIAGNOSIS — J449 Chronic obstructive pulmonary disease, unspecified: Secondary | ICD-10-CM | POA: Diagnosis not present

## 2019-09-17 DIAGNOSIS — I10 Essential (primary) hypertension: Secondary | ICD-10-CM | POA: Diagnosis not present

## 2019-09-23 DIAGNOSIS — J449 Chronic obstructive pulmonary disease, unspecified: Secondary | ICD-10-CM | POA: Diagnosis not present

## 2019-09-24 ENCOUNTER — Encounter (HOSPITAL_COMMUNITY): Payer: Self-pay

## 2019-09-25 ENCOUNTER — Other Ambulatory Visit (HOSPITAL_COMMUNITY)
Admission: RE | Admit: 2019-09-25 | Discharge: 2019-09-25 | Disposition: A | Discharge status: Home / Self Care | Payer: Medicare Other | Source: Ambulatory Visit | Attending: Internal Medicine | Admitting: Internal Medicine

## 2019-09-25 ENCOUNTER — Encounter (HOSPITAL_COMMUNITY)
Admission: RE | Admit: 2019-09-25 | Discharge: 2019-09-25 | Disposition: A | Discharge status: Home / Self Care | Payer: Medicare Other | Source: Ambulatory Visit | Attending: Internal Medicine | Admitting: Internal Medicine

## 2019-09-25 ENCOUNTER — Other Ambulatory Visit: Payer: Self-pay

## 2019-09-25 DIAGNOSIS — Z20828 Contact with and (suspected) exposure to other viral communicable diseases: Secondary | ICD-10-CM | POA: Diagnosis not present

## 2019-09-25 DIAGNOSIS — Z01812 Encounter for preprocedural laboratory examination: Secondary | ICD-10-CM | POA: Insufficient documentation

## 2019-09-25 LAB — SARS CORONAVIRUS 2 (TAT 6-24 HRS): SARS Coronavirus 2: NEGATIVE

## 2019-09-27 ENCOUNTER — Ambulatory Visit (HOSPITAL_COMMUNITY)
Admission: RE | Admit: 2019-09-27 | Discharge: 2019-09-27 | Disposition: A | Discharge status: Home / Self Care | Payer: Medicare Other | Attending: Internal Medicine | Admitting: Internal Medicine

## 2019-09-27 ENCOUNTER — Other Ambulatory Visit: Payer: Self-pay

## 2019-09-27 ENCOUNTER — Ambulatory Visit (HOSPITAL_COMMUNITY): Payer: Medicare Other | Admitting: Anesthesiology

## 2019-09-27 ENCOUNTER — Encounter (HOSPITAL_COMMUNITY): Payer: Self-pay | Admitting: Internal Medicine

## 2019-09-27 ENCOUNTER — Encounter (HOSPITAL_COMMUNITY)
Admission: RE | Disposition: A | Discharge status: Home / Self Care | Payer: Self-pay | Source: Home / Self Care | Attending: Internal Medicine

## 2019-09-27 DIAGNOSIS — K5730 Diverticulosis of large intestine without perforation or abscess without bleeding: Secondary | ICD-10-CM | POA: Insufficient documentation

## 2019-09-27 DIAGNOSIS — I10 Essential (primary) hypertension: Secondary | ICD-10-CM | POA: Diagnosis not present

## 2019-09-27 DIAGNOSIS — K635 Polyp of colon: Secondary | ICD-10-CM | POA: Diagnosis not present

## 2019-09-27 DIAGNOSIS — Z7982 Long term (current) use of aspirin: Secondary | ICD-10-CM | POA: Insufficient documentation

## 2019-09-27 DIAGNOSIS — Z9981 Dependence on supplemental oxygen: Secondary | ICD-10-CM | POA: Diagnosis not present

## 2019-09-27 DIAGNOSIS — Z8546 Personal history of malignant neoplasm of prostate: Secondary | ICD-10-CM | POA: Diagnosis not present

## 2019-09-27 DIAGNOSIS — Z09 Encounter for follow-up examination after completed treatment for conditions other than malignant neoplasm: Secondary | ICD-10-CM | POA: Diagnosis not present

## 2019-09-27 DIAGNOSIS — Z86010 Personal history of colonic polyps: Secondary | ICD-10-CM | POA: Diagnosis not present

## 2019-09-27 DIAGNOSIS — J449 Chronic obstructive pulmonary disease, unspecified: Secondary | ICD-10-CM | POA: Diagnosis not present

## 2019-09-27 DIAGNOSIS — D123 Benign neoplasm of transverse colon: Secondary | ICD-10-CM | POA: Diagnosis not present

## 2019-09-27 DIAGNOSIS — Z87891 Personal history of nicotine dependence: Secondary | ICD-10-CM | POA: Insufficient documentation

## 2019-09-27 DIAGNOSIS — Z79899 Other long term (current) drug therapy: Secondary | ICD-10-CM | POA: Insufficient documentation

## 2019-09-27 HISTORY — PX: COLONOSCOPY WITH PROPOFOL: SHX5780

## 2019-09-27 HISTORY — PX: POLYPECTOMY: SHX5525

## 2019-09-27 SURGERY — COLONOSCOPY WITH PROPOFOL
Anesthesia: General

## 2019-09-27 MED ORDER — CHLORHEXIDINE GLUCONATE CLOTH 2 % EX PADS: 6.0000 | MEDICATED_PAD | Freq: Once | CUTANEOUS | Status: DC

## 2019-09-27 MED ORDER — LACTATED RINGERS IV SOLN
Freq: Once | INTRAVENOUS | Status: AC
  Administered 2019-09-27: 12:00:00 via INTRAVENOUS

## 2019-09-27 MED ORDER — LACTATED RINGERS IV SOLN
INTRAVENOUS | Status: DC | PRN
  Administered 2019-09-27 (×2): via INTRAVENOUS

## 2019-09-27 MED ORDER — PROPOFOL 10 MG/ML IV BOLUS
INTRAVENOUS | Status: DC | PRN
  Administered 2019-09-27 (×5): 20 mg via INTRAVENOUS
  Administered 2019-09-27: 40 mg via INTRAVENOUS
  Administered 2019-09-27: 1 mg via INTRAVENOUS

## 2019-09-27 MED ORDER — PROPOFOL 500 MG/50ML IV EMUL
INTRAVENOUS | Status: DC | PRN
  Administered 2019-09-27: 150 ug/kg/min via INTRAVENOUS

## 2019-09-27 NOTE — Discharge Instructions (Signed)
Colonoscopy Discharge Instructions  Read the instructions outlined below and refer to this sheet in the next few weeks. These discharge instructions provide you with general information on caring for yourself after you leave the hospital. Your doctor may also give you specific instructions. While your treatment has been planned according to the most current medical practices available, unavoidable complications occasionally occur. If you have any problems or questions after discharge, call Dr. Gala Romney at [phone]. ACTIVITY  You may resume your regular activity, but move at a slower pace for the next 24 hours.   Take frequent rest periods for the next 24 hours.   Walking will help get rid of the air and reduce the bloated feeling in your belly (abdomen).   No driving for 24 hours (because of the medicine (anesthesia) used during the test).    Do not sign any important legal documents or operate any machinery for 24 hours (because of the anesthesia used during the test).  NUTRITION  Drink plenty of fluids.   You may resume your normal diet as instructed by your doctor.   Begin with a light meal and progress to your normal diet. Heavy or fried foods are harder to digest and may make you feel sick to your stomach (nauseated).   Avoid alcoholic beverages for 24 hours or as instructed.  MEDICATIONS  You may resume your normal medications unless your doctor tells you otherwise.  WHAT YOU CAN EXPECT TODAY  Some feelings of bloating in the abdomen.   Passage of more gas than usual.   Spotting of blood in your stool or on the toilet paper.  IF YOU HAD POLYPS REMOVED DURING THE COLONOSCOPY:  No aspirin products for 7 days or as instructed.   No alcohol for 7 days or as instructed.   Eat a soft diet for the next 24 hours.  FINDING OUT THE RESULTS OF YOUR TEST Not all test results are available during your visit. If your test results are not back during the visit, make an appointment  with your caregiver to find out the results. Do not assume everything is normal if you have not heard from your caregiver or the medical facility. It is important for you to follow up on all of your test results.  SEEK IMMEDIATE MEDICAL ATTENTION IF:  You have more than a spotting of blood in your stool.   Your belly is swollen (abdominal distention).   You are nauseated or vomiting.   You have a temperature over 101.   You have abdominal pain or discomfort that is severe or gets worse throughout the day.    Colon Polyps  Polyps are tissue growths inside the body. Polyps can grow in many places, including the large intestine (colon). A polyp may be a round bump or a mushroom-shaped growth. You could have one polyp or several. Most colon polyps are noncancerous (benign). However, some colon polyps can become cancerous over time. Finding and removing the polyps early can help prevent this. What are the causes? The exact cause of colon polyps is not known. What increases the risk? You are more likely to develop this condition if you:  Have a family history of colon cancer or colon polyps.  Are older than 67 or older than 45 if you are African American.  Have inflammatory bowel disease, such as ulcerative colitis or Crohn's disease.  Have certain hereditary conditions, such as: ? Familial adenomatous polyposis. ? Lynch syndrome. ? Turcot syndrome. ? Peutz-Jeghers syndrome.  Are  overweight.  Smoke cigarettes.  Do not get enough exercise.  Drink too much alcohol.  Eat a diet that is high in fat and red meat and low in fiber.  Had childhood cancer that was treated with abdominal radiation. What are the signs or symptoms? Most polyps do not cause symptoms. If you have symptoms, they may include:  Blood coming from your rectum when having a bowel movement.  Blood in your stool. The stool may look dark red or black.  Abdominal pain.  A change in bowel habits, such as  constipation or diarrhea. How is this diagnosed? This condition is diagnosed with a colonoscopy. This is a procedure in which a lighted, flexible scope is inserted into the anus and then passed into the colon to examine the area. Polyps are sometimes found when a colonoscopy is done as part of routine cancer screening tests. How is this treated? Treatment for this condition involves removing any polyps that are found. Most polyps can be removed during a colonoscopy. Those polyps will then be tested for cancer. Additional treatment may be needed depending on the results of testing. Follow these instructions at home: Lifestyle  Maintain a healthy weight, or lose weight if recommended by your health care provider.  Exercise every day or as told by your health care provider.  Do not use any products that contain nicotine or tobacco, such as cigarettes and e-cigarettes. If you need help quitting, ask your health care provider.  If you drink alcohol, limit how much you have: ? 0-1 drink a day for women. ? 0-2 drinks a day for men.  Be aware of how much alcohol is in your drink. In the [place] one drink equals one 12 oz bottle of beer (355 mL), one 5 oz glass of wine (148 mL), or one 1 oz shot of hard liquor (44 mL). Eating and drinking   Eat foods that are high in fiber, such as fruits, vegetables, and whole grains.  Eat foods that are high in calcium and vitamin D, such as milk, cheese, yogurt, eggs, liver, fish, and broccoli.  Limit foods that are high in fat, such as fried foods and desserts.  Limit the amount of red meat and processed meat you eat, such as hot dogs, sausage, bacon, and lunch meats. General instructions  Keep all follow-up visits as told by your health care provider. This is important. ? This includes having regularly scheduled colonoscopies. ? Talk to your health care provider about when you need a colonoscopy. Contact a health care provider if:  You have new or  worsening bleeding during a bowel movement.  You have new or increased blood in your stool.  You have a change in bowel habits.  You lose weight for no known reason. Summary  Polyps are tissue growths inside the body. Polyps can grow in many places, including the colon.  Most colon polyps are noncancerous (benign), but some can become cancerous over time.  This condition is diagnosed with a colonoscopy.  Treatment for this condition involves removing any polyps that are found. Most polyps can be removed during a colonoscopy. This information is not intended to replace advice given to you by your health care provider. Make sure you discuss any questions you have with your health care provider. Document Released: 06/30/2004 Document Revised: 01/19/2018 Document Reviewed: 01/19/2018 Elsevier Patient Education  [employer].  Diverticulosis  Diverticulosis is a condition that develops when small pouches (diverticula) form in the wall of the large  intestine (colon). The colon is where water is absorbed and stool is formed. The pouches form when the inside layer of the colon pushes through weak spots in the outer layers of the colon. You may have a few pouches or many of them. What are the causes? The cause of this condition is not known. What increases the risk? The following factors may make you more likely to develop this condition:  Being older than age 60. Your risk for this condition increases with age. Diverticulosis is rare among people younger than age 76. By age 72, many people have it.  Eating a low-fiber diet.  Having frequent constipation.  Being overweight.  Not getting enough exercise.  Smoking.  Taking over-the-counter pain medicines, like aspirin and ibuprofen.  Having a family history of diverticulosis. What are the signs or symptoms? In most people, there are no symptoms of this condition. If you do have symptoms, they may include:  Bloating.  Cramps  in the abdomen.  Constipation or diarrhea.  Pain in the lower left side of the abdomen. How is this diagnosed? This condition is most often diagnosed during an exam for other colon problems. Because diverticulosis usually has no symptoms, it often cannot be diagnosed independently. This condition may be diagnosed by:  Using a flexible scope to examine the colon (colonoscopy).  Taking an X-ray of the colon after dye has been put into the colon (barium enema).  Doing a CT scan. How is this treated? You may not need treatment for this condition if you have never developed an infection related to diverticulosis. If you have had an infection before, treatment may include:  Eating a high-fiber diet. This may include eating more fruits, vegetables, and grains.  Taking a fiber supplement.  Taking a live bacteria supplement (probiotic).  Taking medicine to relax your colon.  Taking antibiotic medicines. Follow these instructions at home:  Drink 6-8 glasses of water or more each day to prevent constipation.  Try not to strain when you have a bowel movement.  If you have had an infection before: ? Eat more fiber as directed by your health care provider or your diet and nutrition specialist (dietitian). ? Take a fiber supplement or probiotic, if your health care provider approves.  Take over-the-counter and prescription medicines only as told by your health care provider.  If you were prescribed an antibiotic, take it as told by your health care provider. Do not stop taking the antibiotic even if you start to feel better.  Keep all follow-up visits as told by your health care provider. This is important. Contact a health care provider if:  You have pain in your abdomen.  You have bloating.  You have cramps.  You have not had a bowel movement in 3 days. Get help right away if:  Your pain gets worse.  Your bloating becomes very bad.  You have a fever or chills, and your  symptoms suddenly get worse.  You vomit.  You have bowel movements that are bloody or black.  You have bleeding from your rectum. Summary  Diverticulosis is a condition that develops when small pouches (diverticula) form in the wall of the large intestine (colon).  You may have a few pouches or many of them.  This condition is most often diagnosed during an exam for other colon problems.  If you have had an infection related to diverticulosis, treatment may include increasing the fiber in your diet, taking supplements, or taking medicines. This information is  not intended to replace advice given to you by your health care provider. Make sure you discuss any questions you have with your health care provider. Document Released: 07/01/2004 Document Revised: 09/16/2017 Document Reviewed: 08/23/2016 Elsevier Patient Education  2020 [employer].    Diverticulosis and colon polyp information provided  Further recommendations to follow pending review of pathology report  At patient request, I called daughter, Elwin Sleight at [phone] and reviewed results.

## 2019-09-27 NOTE — Anesthesia Postprocedure Evaluation (Signed)
Anesthesia Post Note  Patient: Michael Ellison  Procedure(s) Performed: COLONOSCOPY WITH PROPOFOL (N/A ) POLYPECTOMY  Patient location during evaluation: PACU Anesthesia Type: General Level of consciousness: awake and alert and oriented Pain management: pain level controlled Vital Signs Assessment: post-procedure vital signs reviewed and stable Respiratory status: spontaneous breathing Cardiovascular status: blood pressure returned to baseline and stable Postop Assessment: no apparent nausea or vomiting Anesthetic complications: no     Last Vitals:  Vitals:   09/27/19 1211 09/27/19 1407  BP: 134/87 (P) 124/73  Pulse: 94   Resp: 18 (P) 19  Temp: 36.7 C (P) 36.7 C  SpO2: 96% (P) 98%    Last Pain:  Vitals:   09/27/19 1337  TempSrc:   PainSc: 0-No pain                 Rayen Palen

## 2019-09-27 NOTE — H&P (Signed)
@LOGO @   Primary Care Physician:  Rosita Fire, MD Primary Gastroenterologist:  Dr. Gala Romney  Pre-Procedure History & Physical: HPI:  Michael Ellison is a 73 y.o. male here for surveillance colonoscopy.  History of relatively large adenomas removed from his colon 3 years ago.  Chronic constipation his only GI symptom at this time.  For surveillance examination.  Past Medical History:  Diagnosis Date  . Arthritis   . Aspiration pneumonia ([place]) 03/01/2012  . Asthma   . Cancer ([place])   . Cancer of prostate ([place])   . COPD (chronic obstructive pulmonary disease) ([facility])    home O2   PRN    . Depression   . GERD (gastroesophageal reflux disease)   . Hernia of abdominal cavity   . Hypertension   . On home O2    3L prn  . Shortness of breath     Past Surgical History:  Procedure Laterality Date  . BOWEL RESECTION  02/25/2012   Procedure: SMALL BOWEL RESECTION;  Surgeon: Donato Heinz, MD;  Location: [facility];  Service: General;;  . COLONOSCOPY N/A 05/27/2016   Dr.Trinetta Alemu: 14 mm polyp in the sigmoid colon removed, tubular adenoma.  8 mm polyp in the descending colon removed, tubular adenoma.  Diverticulosis.  Recommend colonoscopy 3 years  . HERNIA REPAIR    . INCISION AND DRAINAGE ABSCESS N/A 01/15/2013   Procedure: INCISION AND DRAINAGE Abdominal Wall ABSCESS;  Surgeon: Gwenyth Ober, MD;  Location: [place];  Service: General;  Laterality: N/A;  . INGUINAL HERNIA REPAIR Right 09/16/2017   Procedure: RIGHT INGUINAL HERNIORRHAPHY  WITH MESH;  Surgeon: Aviva Signs, MD;  Location: [facility];  Service: General;  Laterality: Right;  . INSERTION OF MESH N/A 12/25/2012   Procedure: INSERTION OF MESH;  Surgeon: Gwenyth Ober, MD;  Location: [place];  Service: General;  Laterality: N/A;  . INSERTION OF MESH N/A 08/26/2016   Procedure: INSERTION OF MESH;  Surgeon: Judeth Horn, MD;  Location: [facility];  Service: General;  Laterality: N/A;  . LAPAROTOMY  02/25/2012   Procedure: EXPLORATORY LAPAROTOMY;  Surgeon:  Donato Heinz, MD;  Location: [facility];  Service: General;  Laterality: N/A;  . POLYPECTOMY  05/27/2016   Procedure: POLYPECTOMY;  Surgeon: Daneil Dolin, MD;  [facility];  Service: Endoscopy;;  Descending colon polyp and Sigmoid colon polyp removed via hot snare  . PROSTATE BIOPSY    . robotic prostate surgery  2011  . TRACHEOSTOMY  03/2012  . VENTRAL HERNIA REPAIR  12/25/2012    Dr Hulen Skains  . VENTRAL HERNIA REPAIR N/A 12/25/2012   Procedure: HERNIA REPAIR VENTRAL ADULT;  Surgeon: Gwenyth Ober, MD;  Location: [facility];  Service: General;  Laterality: N/A;  . VENTRAL HERNIA REPAIR N/A 08/26/2016   Procedure: VENTRAL INCISIONAL HERNIA REPAIR WITH MESH;  Surgeon: Judeth Horn, MD;  Location: [facility];  Service: General;  Laterality: N/A;  . WOUND DEBRIDEMENT  03/02/2012   Procedure: DEBRIDEMENT CLOSURE/ABDOMINAL WOUND;  Surgeon: Gwenyth Ober, MD;  Location: [place];  Service: General;  Laterality: N/A;  [facility] DIVERTICULECTOMY  2013   Dr. Redmond Baseman    Prior to Admission medications   Medication Sig Start Date End Date Taking? Authorizing Provider  acetaminophen (TYLENOL) 500 MG tablet Take 500 mg by mouth every 6 (six) hours as needed for headache.    Yes [provider]  albuterol (PROVENTIL HFA;VENTOLIN HFA) 108 (90 Base) MCG/ACT inhaler Inhale 2 puffs into the lungs every  4 (four) hours as needed for wheezing or shortness of breath. 11/14/15  Yes Francine Graven, DO  Fluticasone-Umeclidin-Vilant (TRELEGY ELLIPTA) 100-62.5-25 MCG/INH AEPB Inhale into the lungs as needed.    Yes [provider]  gabapentin (NEURONTIN) 300 MG capsule Take 300 mg by mouth as needed.   Yes [provider]  ibuprofen (ADVIL,MOTRIN) 200 MG tablet Take 400 mg every 6 (six) hours as needed by mouth for headache or moderate pain.   Yes [provider]  ipratropium-albuterol (DUONEB) 0.5-2.5 (3) MG/3ML SOLN Take 3 mLs by nebulization every 6 (six) hours as needed (for shortness of  breath/wheezing).    Yes [provider]  lisinopril (PRINIVIL,ZESTRIL) 20 MG tablet Take 20 mg daily by mouth.   Yes [provider]  tamsulosin (FLOMAX) 0.4 MG CAPS capsule Take 1 capsule by mouth daily. 07/05/18  Yes [provider]  traMADol-acetaminophen (ULTRACET) 37.5-325 MG tablet Take 1 tablet by mouth every 6 (six) hours as needed for moderate pain or severe pain. 09/16/17  Yes Aviva Signs, MD  aspirin EC 81 MG tablet Take 81 mg by mouth as needed. Takes 1 to 2 a month.    [provider]    Allergies as of 07/11/2019 - Review Complete 07/11/2019  Allergen Reaction Noted  . Lorazepam Other (See Comments) 02/27/2012    Family History  Problem Relation Age of Onset  . Pseudochol deficiency Neg Hx   . Anesthesia problems Neg Hx   . Hypotension Neg Hx   . Malignant hyperthermia Neg Hx   . Colon cancer Neg Hx     Social History   Socioeconomic History  . Marital status: Divorced    Spouse name: Not on file  . Number of children: Not on file  . Years of education: Not on file  . Highest education level: Not on file  Occupational History  . Occupation: retired    Comment: [employer]  Tobacco Use  . Smoking status: Former Smoker    Packs/day: 1.50    Years: 44.00    Pack years: 66.00    Types: Cigarettes    Quit date: 02/22/2012    Years since quitting: 7.6  . Smokeless tobacco: Never Used  . Tobacco comment: Quit x 4 years  Substance and Sexual Activity  . Alcohol use: No    Alcohol/week: 0.0 standard drinks    Comment: sober since ~ 02/2012  . Drug use: No  . Sexual activity: Never  Other Topics Concern  . Not on file  Social History Narrative  . Not on file   Social Determinants of Health   Financial Resource Strain:   . Difficulty of Paying Living Expenses: Not on file  Food Insecurity:   . Worried About Charity fundraiser in the Last Year: Not on file  . Ran Out of Food in the Last Year: Not on file  Transportation Needs:    . Lack of Transportation (Medical): Not on file  . Lack of Transportation (Non-Medical): Not on file  Physical Activity:   . Days of Exercise per Week: Not on file  . Minutes of Exercise per Session: Not on file  Stress:   . Feeling of Stress : Not on file  Social Connections:   . Frequency of Communication with Friends and Family: Not on file  . Frequency of Social Gatherings with Friends and Family: Not on file  . Attends Religious Services: Not on file  . Active Member of Clubs or Organizations: Not on  file  . Attends Archivist Meetings: Not on file  . Marital Status: Not on file  Intimate Partner Violence:   . Fear of Current or Ex-Partner: Not on file  . Emotionally Abused: Not on file  . Physically Abused: Not on file  . Sexually Abused: Not on file    Review of Systems: See HPI, otherwise negative ROS  Physical Exam: BP 134/87   Pulse 94   Temp 98 F (36.7 C) (Oral)   Resp 18   Ht 6' 2.5" (1.892 m)   Wt 93 kg   SpO2 96%   BMI 25.97 kg/m  General:   Alert,  Well-developed, well-nourished, pleasant and cooperative in NAD Neck:  Supple; no masses or thyromegaly. No significant cervical adenopathy. Lungs:  Clear throughout to auscultation.   No wheezes, crackles, or rhonchi. No acute distress. Heart:  Regular rate and rhythm; no murmurs, clicks, rubs,  or gallops. Abdomen: Non-distended, normal bowel sounds.  Soft and nontender without appreciable mass or hepatosplenomegaly.  Impression/Plan:    Pleasant 73 year old gent with history large colonic adenomas removed 3 years ago peer here for surveillance examination.  Hopefully, this would last colonoscopy he will need. The risks, benefits, limitations, alternatives and imponderables have been reviewed with the patient. Questions have been answered. All parties are agreeable.      Notice: This dictation was prepared with Dragon dictation along with smaller phrase technology. Any transcriptional errors that  result from this process are unintentional and may not be corrected upon review.

## 2019-09-27 NOTE — Transfer of Care (Signed)
Immediate Anesthesia Transfer of Care Note  Patient: Michael Ellison  Procedure(s) Performed: COLONOSCOPY WITH PROPOFOL (N/A ) POLYPECTOMY  Patient Location: PACU  Anesthesia Type:General  Level of Consciousness: awake  Airway & Oxygen Therapy: Patient Spontanous Breathing  Post-op Assessment: Report given to RN  Post vital signs: Reviewed  Last Vitals:  Vitals Value Taken Time  BP    Temp    Pulse    Resp    SpO2      Last Pain:  Vitals:   09/27/19 1337  TempSrc:   PainSc: 0-No pain      Patients Stated Pain Goal: 6 ([id number])  Complications: No apparent anesthesia complications

## 2019-09-27 NOTE — Op Note (Signed)
[facility] Patient Name: Michael Ellison Procedure Date: 09/27/2019 1:02 PM MRN: [id number] Date of Birth: [birth year] Attending MD: Norvel Richards , MD CSN: [id number] Age: 73 Admit Type: Outpatient Procedure:                Colonoscopy Indications:              High risk colon cancer surveillance: Personal                            history of colonic polyps Providers:                Norvel Richards, MD, Otis Peak B. Sharon Seller, RN,                            Nelma Rothman, Technician Referring MD:              Medicines:                Propofol per Anesthesia Complications:            No immediate complications. Estimated Blood Loss:     Estimated blood loss was minimal. Procedure:                Pre-Anesthesia Assessment:                           - Prior to the procedure, a History and Physical                            was performed, and patient medications and                            allergies were reviewed. The patient's tolerance of                            previous anesthesia was also reviewed. The risks                            and benefits of the procedure and the sedation                            options and risks were discussed with the patient.                            All questions were answered, and informed consent                            was obtained. Prior Anticoagulants: The patient has                            taken no previous anticoagulant or antiplatelet                            agents. ASA Grade Assessment: II - A patient with  mild systemic disease. After reviewing the risks                            and benefits, the patient was deemed in                            satisfactory condition to undergo the procedure.                           After obtaining informed consent, the colonoscope                            was passed under direct vision. Throughout the                            procedure, the  patient's blood pressure, pulse, and                            oxygen saturations were monitored continuously. The                            [id number] scope was introduced through                            the anus and advanced to the the cecum, identified                            by appendiceal orifice and ileocecal valve. The                            colonoscopy was performed without difficulty. The                            patient tolerated the procedure well. The quality                            of the bowel preparation was adequate. Scope In: 1:43:47 PM Scope Out: 2:00:01 PM Scope Withdrawal Time: 0 hours 11 minutes 12 seconds  Total Procedure Duration: 0 hours 16 minutes 14 seconds  Findings:      The perianal and digital rectal examinations were normal.      Multiple large-mouthed diverticula were found in the sigmoid colon and       descending colon.      A 6 mm polyp was found in the splenic flexure. The polyp was       pedunculated. The polyp was removed with a cold snare. Resection and       retrieval were complete. Estimated blood loss was minimal.      The exam was otherwise without abnormality on direct and retroflexion       views. Impression:               - Diverticulosis in the sigmoid colon and in the                            descending colon.                           -  One 6 mm polyp at the splenic flexure, removed                            with a cold snare. Resected and retrieved.                           - The examination was otherwise normal on direct                            and retroflexion views. Moderate Sedation:      Moderate (conscious) sedation was personally administered by an       anesthesia professional. The following parameters were monitored: oxygen       saturation, heart rate, blood pressure, respiratory rate, EKG, adequacy       of pulmonary ventilation, and response to care. Recommendation:           - Patient has a  contact number available for                            emergencies. The signs and symptoms of potential                            delayed complications were discussed with the                            patient. Return to normal activities tomorrow.                            Written discharge instructions were provided to the                            patient.                           - Resume previous diet.                           - Continue present medications.                           - Repeat colonoscopy date to be determined after                            pending pathology results are reviewed for                            surveillance based on pathology results.                           - Return to GI office (date not yet determined). Procedure Code(s):        --- Professional ---                           [phone], Colonoscopy, flexible; with removal of  tumor(s), polyp(s), or other lesion(s) by snare                            technique Diagnosis Code(s):        --- Professional ---                           Z86.010, Personal history of colonic polyps                           K63.5, Polyp of colon                           K57.30, Diverticulosis of large intestine without                            perforation or abscess without bleeding CPT copyright 2019 [employer]. All rights reserved. The codes documented in this report are preliminary and upon coder review may  be revised to meet current compliance requirements. Cristopher Estimable. Jahzaria Vary, MD Norvel Richards, MD 09/27/2019 2:09:16 PM This report has been signed electronically. Number of Addenda: 0

## 2019-09-27 NOTE — Anesthesia Preprocedure Evaluation (Addendum)
Anesthesia Evaluation  Patient identified by MRN, date of birth, ID band Patient awake    Reviewed: Allergy & Precautions, NPO status , Patient's Chart, lab work & pertinent test results  Airway Mallampati: II  TM Distance: >3 FB Neck ROM: Full    Dental  (+) Missing, Partial Upper, Partial Lower, Dental Advisory Given   Pulmonary shortness of breath, asthma , pneumonia, COPD,  COPD inhaler, former smoker,    Pulmonary exam normal breath sounds clear to auscultation       Cardiovascular Exercise Tolerance: Good METS: 3 - Mets hypertension, Pt. on medications Normal cardiovascular exam Rhythm:Regular Rate:Normal  26-Jul-2018 11:32:08 [facility]- Sinus rhythm Ventricular premature complex RSR' in V1 or V2, probably normal variant Left ventricular hypertrophy Inferior infarct, old Confirmed by Virgel Manifold [phone]) on 07/26/2018   Echo - 2015 Left ventricle: The cavity size was normal. Wall thickness was  normal. Systolic function was vigorous. The estimated ejection  fraction was in the range of 65% to 70%. Wall motion was normal;  there were no regional wall motion abnormalities. Doppler  parameters are consistent with abnormal left ventricular  relaxation (grade 1 diastolic dysfunction).    Neuro/Psych PSYCHIATRIC DISORDERS Depression    GI/Hepatic neg GERD  ,  Endo/Other    Renal/GU   negative genitourinary   Musculoskeletal  (+) Arthritis , Osteoarthritis,    Abdominal   Peds  Hematology  (+) anemia ,   Anesthesia Other Findings   Reproductive/Obstetrics                         Anesthesia Physical Anesthesia Plan  ASA: III  Anesthesia Plan: General   Post-op Pain Management:    Induction: Intravenous  PONV Risk Score and Plan: 0 and TIVA  Airway Management Planned: Nasal Cannula, Natural Airway and Simple Face Mask  Additional Equipment:    Intra-op Plan:   Post-operative Plan:   Informed Consent: I have reviewed the patients History and Physical, chart, labs and discussed the procedure including the risks, benefits and alternatives for the proposed anesthesia with the patient or authorized representative who has indicated his/her understanding and acceptance.     Dental advisory given  Plan Discussed with: CRNA  Anesthesia Plan Comments:         Anesthesia Quick Evaluation

## 2019-10-01 ENCOUNTER — Encounter: Payer: Self-pay | Admitting: Internal Medicine

## 2019-10-01 LAB — SURGICAL PATHOLOGY

## 2019-10-09 DIAGNOSIS — I10 Essential (primary) hypertension: Secondary | ICD-10-CM | POA: Diagnosis not present

## 2019-10-09 DIAGNOSIS — J449 Chronic obstructive pulmonary disease, unspecified: Secondary | ICD-10-CM | POA: Diagnosis not present

## 2019-10-09 DIAGNOSIS — G629 Polyneuropathy, unspecified: Secondary | ICD-10-CM | POA: Diagnosis not present

## 2019-10-23 DIAGNOSIS — Z0001 Encounter for general adult medical examination with abnormal findings: Secondary | ICD-10-CM | POA: Diagnosis not present

## 2019-10-23 DIAGNOSIS — I10 Essential (primary) hypertension: Secondary | ICD-10-CM | POA: Diagnosis not present

## 2019-10-24 DIAGNOSIS — J449 Chronic obstructive pulmonary disease, unspecified: Secondary | ICD-10-CM | POA: Diagnosis not present

## 2019-11-09 DIAGNOSIS — J449 Chronic obstructive pulmonary disease, unspecified: Secondary | ICD-10-CM | POA: Diagnosis not present

## 2019-11-09 DIAGNOSIS — I10 Essential (primary) hypertension: Secondary | ICD-10-CM | POA: Diagnosis not present

## 2019-11-24 DIAGNOSIS — J449 Chronic obstructive pulmonary disease, unspecified: Secondary | ICD-10-CM | POA: Diagnosis not present

## 2019-12-07 ENCOUNTER — Encounter: Payer: Self-pay | Admitting: *Deleted

## 2019-12-10 DIAGNOSIS — J449 Chronic obstructive pulmonary disease, unspecified: Secondary | ICD-10-CM | POA: Diagnosis not present

## 2019-12-10 DIAGNOSIS — I10 Essential (primary) hypertension: Secondary | ICD-10-CM | POA: Diagnosis not present

## 2019-12-22 DIAGNOSIS — J449 Chronic obstructive pulmonary disease, unspecified: Secondary | ICD-10-CM | POA: Diagnosis not present

## 2020-01-07 DIAGNOSIS — J449 Chronic obstructive pulmonary disease, unspecified: Secondary | ICD-10-CM | POA: Diagnosis not present

## 2020-01-07 DIAGNOSIS — I10 Essential (primary) hypertension: Secondary | ICD-10-CM | POA: Diagnosis not present

## 2020-01-22 DIAGNOSIS — J449 Chronic obstructive pulmonary disease, unspecified: Secondary | ICD-10-CM | POA: Diagnosis not present

## 2020-02-07 DIAGNOSIS — J449 Chronic obstructive pulmonary disease, unspecified: Secondary | ICD-10-CM | POA: Diagnosis not present

## 2020-02-07 DIAGNOSIS — I10 Essential (primary) hypertension: Secondary | ICD-10-CM | POA: Diagnosis not present

## 2020-02-21 DIAGNOSIS — J449 Chronic obstructive pulmonary disease, unspecified: Secondary | ICD-10-CM | POA: Diagnosis not present

## 2020-03-04 DIAGNOSIS — Z1389 Encounter for screening for other disorder: Secondary | ICD-10-CM | POA: Diagnosis not present

## 2020-03-04 DIAGNOSIS — J449 Chronic obstructive pulmonary disease, unspecified: Secondary | ICD-10-CM | POA: Diagnosis not present

## 2020-03-04 DIAGNOSIS — I10 Essential (primary) hypertension: Secondary | ICD-10-CM | POA: Diagnosis not present

## 2020-03-04 DIAGNOSIS — Z0001 Encounter for general adult medical examination with abnormal findings: Secondary | ICD-10-CM | POA: Diagnosis not present

## 2020-03-05 DIAGNOSIS — I10 Essential (primary) hypertension: Secondary | ICD-10-CM | POA: Diagnosis not present

## 2020-03-05 DIAGNOSIS — Z0001 Encounter for general adult medical examination with abnormal findings: Secondary | ICD-10-CM | POA: Diagnosis not present

## 2020-03-23 DIAGNOSIS — J449 Chronic obstructive pulmonary disease, unspecified: Secondary | ICD-10-CM | POA: Diagnosis not present

## 2020-04-04 DIAGNOSIS — J449 Chronic obstructive pulmonary disease, unspecified: Secondary | ICD-10-CM | POA: Diagnosis not present

## 2020-04-22 DIAGNOSIS — J449 Chronic obstructive pulmonary disease, unspecified: Secondary | ICD-10-CM | POA: Diagnosis not present

## 2020-05-04 DIAGNOSIS — I10 Essential (primary) hypertension: Secondary | ICD-10-CM | POA: Diagnosis not present

## 2020-05-23 DIAGNOSIS — J449 Chronic obstructive pulmonary disease, unspecified: Secondary | ICD-10-CM | POA: Diagnosis not present

## 2020-06-04 DIAGNOSIS — J449 Chronic obstructive pulmonary disease, unspecified: Secondary | ICD-10-CM | POA: Diagnosis not present

## 2020-06-23 DIAGNOSIS — J449 Chronic obstructive pulmonary disease, unspecified: Secondary | ICD-10-CM | POA: Diagnosis not present

## 2020-07-10 DIAGNOSIS — I10 Essential (primary) hypertension: Secondary | ICD-10-CM | POA: Diagnosis not present

## 2020-07-10 DIAGNOSIS — J449 Chronic obstructive pulmonary disease, unspecified: Secondary | ICD-10-CM | POA: Diagnosis not present

## 2020-07-10 DIAGNOSIS — R519 Headache, unspecified: Secondary | ICD-10-CM | POA: Diagnosis not present

## 2020-07-15 DIAGNOSIS — I10 Essential (primary) hypertension: Secondary | ICD-10-CM | POA: Diagnosis not present

## 2020-07-15 DIAGNOSIS — R42 Dizziness and giddiness: Secondary | ICD-10-CM | POA: Diagnosis not present

## 2020-07-22 DIAGNOSIS — I951 Orthostatic hypotension: Secondary | ICD-10-CM | POA: Diagnosis not present

## 2020-07-22 DIAGNOSIS — G2581 Restless legs syndrome: Secondary | ICD-10-CM | POA: Diagnosis not present

## 2020-07-22 DIAGNOSIS — R269 Unspecified abnormalities of gait and mobility: Secondary | ICD-10-CM | POA: Diagnosis not present

## 2020-07-22 DIAGNOSIS — R278 Other lack of coordination: Secondary | ICD-10-CM | POA: Diagnosis not present

## 2020-07-22 DIAGNOSIS — R42 Dizziness and giddiness: Secondary | ICD-10-CM | POA: Diagnosis not present

## 2020-07-23 DIAGNOSIS — J449 Chronic obstructive pulmonary disease, unspecified: Secondary | ICD-10-CM | POA: Diagnosis not present

## 2020-08-14 DIAGNOSIS — J449 Chronic obstructive pulmonary disease, unspecified: Secondary | ICD-10-CM | POA: Diagnosis not present

## 2020-08-14 DIAGNOSIS — I10 Essential (primary) hypertension: Secondary | ICD-10-CM | POA: Diagnosis not present

## 2020-08-23 DIAGNOSIS — J449 Chronic obstructive pulmonary disease, unspecified: Secondary | ICD-10-CM | POA: Diagnosis not present

## 2020-08-28 DIAGNOSIS — I951 Orthostatic hypotension: Secondary | ICD-10-CM | POA: Diagnosis not present

## 2020-08-28 DIAGNOSIS — J449 Chronic obstructive pulmonary disease, unspecified: Secondary | ICD-10-CM | POA: Diagnosis not present

## 2020-08-28 DIAGNOSIS — R42 Dizziness and giddiness: Secondary | ICD-10-CM | POA: Diagnosis not present

## 2020-08-28 DIAGNOSIS — Z23 Encounter for immunization: Secondary | ICD-10-CM | POA: Diagnosis not present

## 2020-08-28 DIAGNOSIS — I10 Essential (primary) hypertension: Secondary | ICD-10-CM | POA: Diagnosis not present

## 2020-09-22 DIAGNOSIS — J449 Chronic obstructive pulmonary disease, unspecified: Secondary | ICD-10-CM | POA: Diagnosis not present

## 2020-09-27 DIAGNOSIS — J449 Chronic obstructive pulmonary disease, unspecified: Secondary | ICD-10-CM | POA: Diagnosis not present

## 2020-09-27 DIAGNOSIS — I10 Essential (primary) hypertension: Secondary | ICD-10-CM | POA: Diagnosis not present

## 2020-10-23 DIAGNOSIS — J449 Chronic obstructive pulmonary disease, unspecified: Secondary | ICD-10-CM | POA: Diagnosis not present

## 2020-11-06 DIAGNOSIS — J449 Chronic obstructive pulmonary disease, unspecified: Secondary | ICD-10-CM | POA: Diagnosis not present

## 2020-11-06 DIAGNOSIS — I10 Essential (primary) hypertension: Secondary | ICD-10-CM | POA: Diagnosis not present

## 2020-11-23 DIAGNOSIS — J449 Chronic obstructive pulmonary disease, unspecified: Secondary | ICD-10-CM | POA: Diagnosis not present

## 2020-12-07 DIAGNOSIS — J449 Chronic obstructive pulmonary disease, unspecified: Secondary | ICD-10-CM | POA: Diagnosis not present

## 2020-12-07 DIAGNOSIS — I10 Essential (primary) hypertension: Secondary | ICD-10-CM | POA: Diagnosis not present

## 2020-12-21 DIAGNOSIS — J449 Chronic obstructive pulmonary disease, unspecified: Secondary | ICD-10-CM | POA: Diagnosis not present

## 2021-01-04 DIAGNOSIS — I10 Essential (primary) hypertension: Secondary | ICD-10-CM | POA: Diagnosis not present

## 2021-01-04 DIAGNOSIS — J449 Chronic obstructive pulmonary disease, unspecified: Secondary | ICD-10-CM | POA: Diagnosis not present

## 2021-02-04 DIAGNOSIS — I10 Essential (primary) hypertension: Secondary | ICD-10-CM | POA: Diagnosis not present

## 2021-02-04 DIAGNOSIS — J449 Chronic obstructive pulmonary disease, unspecified: Secondary | ICD-10-CM | POA: Diagnosis not present

## 2021-02-16 DIAGNOSIS — I10 Essential (primary) hypertension: Secondary | ICD-10-CM | POA: Diagnosis not present

## 2021-02-16 DIAGNOSIS — Z1389 Encounter for screening for other disorder: Secondary | ICD-10-CM | POA: Diagnosis not present

## 2021-02-16 DIAGNOSIS — Z0001 Encounter for general adult medical examination with abnormal findings: Secondary | ICD-10-CM | POA: Diagnosis not present

## 2021-02-16 DIAGNOSIS — J449 Chronic obstructive pulmonary disease, unspecified: Secondary | ICD-10-CM | POA: Diagnosis not present

## 2021-02-18 ENCOUNTER — Other Ambulatory Visit: Payer: Self-pay

## 2021-02-18 ENCOUNTER — Ambulatory Visit (HOSPITAL_COMMUNITY)
Admission: RE | Admit: 2021-02-18 | Discharge: 2021-02-18 | Disposition: A | Discharge status: Home / Self Care | Payer: Medicare Other | Source: Ambulatory Visit | Attending: Gerontology | Admitting: Gerontology

## 2021-02-18 ENCOUNTER — Other Ambulatory Visit (HOSPITAL_COMMUNITY): Payer: Self-pay | Admitting: Gerontology

## 2021-02-18 DIAGNOSIS — E785 Hyperlipidemia, unspecified: Secondary | ICD-10-CM | POA: Diagnosis not present

## 2021-02-18 DIAGNOSIS — I10 Essential (primary) hypertension: Secondary | ICD-10-CM | POA: Diagnosis not present

## 2021-02-18 DIAGNOSIS — M25561 Pain in right knee: Secondary | ICD-10-CM | POA: Insufficient documentation

## 2021-02-18 DIAGNOSIS — J449 Chronic obstructive pulmonary disease, unspecified: Secondary | ICD-10-CM | POA: Diagnosis not present

## 2021-02-18 DIAGNOSIS — Z0001 Encounter for general adult medical examination with abnormal findings: Secondary | ICD-10-CM | POA: Diagnosis not present

## 2021-02-18 DIAGNOSIS — Z79899 Other long term (current) drug therapy: Secondary | ICD-10-CM | POA: Diagnosis not present

## 2021-03-19 DIAGNOSIS — J449 Chronic obstructive pulmonary disease, unspecified: Secondary | ICD-10-CM | POA: Diagnosis not present

## 2021-03-19 DIAGNOSIS — I10 Essential (primary) hypertension: Secondary | ICD-10-CM | POA: Diagnosis not present

## 2021-04-18 DIAGNOSIS — I10 Essential (primary) hypertension: Secondary | ICD-10-CM | POA: Diagnosis not present

## 2021-04-18 DIAGNOSIS — J449 Chronic obstructive pulmonary disease, unspecified: Secondary | ICD-10-CM | POA: Diagnosis not present

## 2021-05-19 DIAGNOSIS — I10 Essential (primary) hypertension: Secondary | ICD-10-CM | POA: Diagnosis not present

## 2021-05-19 DIAGNOSIS — J449 Chronic obstructive pulmonary disease, unspecified: Secondary | ICD-10-CM | POA: Diagnosis not present

## 2021-06-19 DIAGNOSIS — I10 Essential (primary) hypertension: Secondary | ICD-10-CM | POA: Diagnosis not present

## 2021-06-19 DIAGNOSIS — J449 Chronic obstructive pulmonary disease, unspecified: Secondary | ICD-10-CM | POA: Diagnosis not present

## 2021-07-19 DIAGNOSIS — J449 Chronic obstructive pulmonary disease, unspecified: Secondary | ICD-10-CM | POA: Diagnosis not present

## 2021-07-19 DIAGNOSIS — I10 Essential (primary) hypertension: Secondary | ICD-10-CM | POA: Diagnosis not present

## 2021-08-04 DIAGNOSIS — Z23 Encounter for immunization: Secondary | ICD-10-CM | POA: Diagnosis not present

## 2021-08-24 DIAGNOSIS — I10 Essential (primary) hypertension: Secondary | ICD-10-CM | POA: Diagnosis not present

## 2021-08-24 DIAGNOSIS — M1711 Unilateral primary osteoarthritis, right knee: Secondary | ICD-10-CM | POA: Diagnosis not present

## 2021-08-24 DIAGNOSIS — J449 Chronic obstructive pulmonary disease, unspecified: Secondary | ICD-10-CM | POA: Diagnosis not present

## 2021-09-23 DIAGNOSIS — I10 Essential (primary) hypertension: Secondary | ICD-10-CM | POA: Diagnosis not present

## 2021-09-23 DIAGNOSIS — J449 Chronic obstructive pulmonary disease, unspecified: Secondary | ICD-10-CM | POA: Diagnosis not present

## 2021-10-24 DIAGNOSIS — J449 Chronic obstructive pulmonary disease, unspecified: Secondary | ICD-10-CM | POA: Diagnosis not present

## 2021-10-24 DIAGNOSIS — I10 Essential (primary) hypertension: Secondary | ICD-10-CM | POA: Diagnosis not present

## 2021-11-24 DIAGNOSIS — J449 Chronic obstructive pulmonary disease, unspecified: Secondary | ICD-10-CM | POA: Diagnosis not present

## 2021-11-24 DIAGNOSIS — I10 Essential (primary) hypertension: Secondary | ICD-10-CM | POA: Diagnosis not present

## 2021-12-22 DIAGNOSIS — J449 Chronic obstructive pulmonary disease, unspecified: Secondary | ICD-10-CM | POA: Diagnosis not present

## 2021-12-22 DIAGNOSIS — I10 Essential (primary) hypertension: Secondary | ICD-10-CM | POA: Diagnosis not present

## 2022-01-18 DIAGNOSIS — J441 Chronic obstructive pulmonary disease with (acute) exacerbation: Secondary | ICD-10-CM | POA: Diagnosis not present

## 2022-01-18 DIAGNOSIS — I10 Essential (primary) hypertension: Secondary | ICD-10-CM | POA: Diagnosis not present

## 2022-01-18 DIAGNOSIS — R059 Cough, unspecified: Secondary | ICD-10-CM | POA: Diagnosis not present

## 2022-02-22 DIAGNOSIS — Z1389 Encounter for screening for other disorder: Secondary | ICD-10-CM | POA: Diagnosis not present

## 2022-02-22 DIAGNOSIS — J449 Chronic obstructive pulmonary disease, unspecified: Secondary | ICD-10-CM | POA: Diagnosis not present

## 2022-02-22 DIAGNOSIS — Z0001 Encounter for general adult medical examination with abnormal findings: Secondary | ICD-10-CM | POA: Diagnosis not present

## 2022-02-22 DIAGNOSIS — Z23 Encounter for immunization: Secondary | ICD-10-CM | POA: Diagnosis not present

## 2022-02-22 DIAGNOSIS — I10 Essential (primary) hypertension: Secondary | ICD-10-CM | POA: Diagnosis not present

## 2022-02-24 DIAGNOSIS — Z1159 Encounter for screening for other viral diseases: Secondary | ICD-10-CM | POA: Diagnosis not present

## 2022-02-24 DIAGNOSIS — Z0001 Encounter for general adult medical examination with abnormal findings: Secondary | ICD-10-CM | POA: Diagnosis not present

## 2022-02-24 DIAGNOSIS — I10 Essential (primary) hypertension: Secondary | ICD-10-CM | POA: Diagnosis not present

## 2022-02-24 DIAGNOSIS — Z79899 Other long term (current) drug therapy: Secondary | ICD-10-CM | POA: Diagnosis not present

## 2022-03-25 DIAGNOSIS — M1711 Unilateral primary osteoarthritis, right knee: Secondary | ICD-10-CM | POA: Diagnosis not present

## 2022-03-25 DIAGNOSIS — I10 Essential (primary) hypertension: Secondary | ICD-10-CM | POA: Diagnosis not present

## 2022-04-24 DIAGNOSIS — I10 Essential (primary) hypertension: Secondary | ICD-10-CM | POA: Diagnosis not present

## 2022-04-24 DIAGNOSIS — J449 Chronic obstructive pulmonary disease, unspecified: Secondary | ICD-10-CM | POA: Diagnosis not present

## 2022-05-31 ENCOUNTER — Other Ambulatory Visit (HOSPITAL_COMMUNITY): Payer: Self-pay | Admitting: Internal Medicine

## 2022-05-31 ENCOUNTER — Other Ambulatory Visit: Payer: Self-pay | Admitting: Internal Medicine

## 2022-05-31 ENCOUNTER — Ambulatory Visit (HOSPITAL_COMMUNITY)
Admission: RE | Admit: 2022-05-31 | Discharge: 2022-05-31 | Disposition: A | Discharge status: Home / Self Care | Payer: Medicare Other | Source: Ambulatory Visit | Attending: Internal Medicine | Admitting: Internal Medicine

## 2022-05-31 DIAGNOSIS — M7981 Nontraumatic hematoma of soft tissue: Secondary | ICD-10-CM

## 2022-05-31 DIAGNOSIS — I10 Essential (primary) hypertension: Secondary | ICD-10-CM | POA: Diagnosis not present

## 2022-05-31 DIAGNOSIS — J449 Chronic obstructive pulmonary disease, unspecified: Secondary | ICD-10-CM | POA: Diagnosis not present

## 2022-07-01 DIAGNOSIS — J449 Chronic obstructive pulmonary disease, unspecified: Secondary | ICD-10-CM | POA: Diagnosis not present

## 2022-07-01 DIAGNOSIS — I10 Essential (primary) hypertension: Secondary | ICD-10-CM | POA: Diagnosis not present

## 2022-07-31 DIAGNOSIS — I10 Essential (primary) hypertension: Secondary | ICD-10-CM | POA: Diagnosis not present

## 2022-07-31 DIAGNOSIS — J449 Chronic obstructive pulmonary disease, unspecified: Secondary | ICD-10-CM | POA: Diagnosis not present

## 2022-08-18 DIAGNOSIS — J449 Chronic obstructive pulmonary disease, unspecified: Secondary | ICD-10-CM | POA: Diagnosis not present

## 2022-08-18 DIAGNOSIS — M1711 Unilateral primary osteoarthritis, right knee: Secondary | ICD-10-CM | POA: Diagnosis not present

## 2022-08-18 DIAGNOSIS — I10 Essential (primary) hypertension: Secondary | ICD-10-CM | POA: Diagnosis not present

## 2022-09-17 DIAGNOSIS — I10 Essential (primary) hypertension: Secondary | ICD-10-CM | POA: Diagnosis not present

## 2022-10-18 DIAGNOSIS — I10 Essential (primary) hypertension: Secondary | ICD-10-CM | POA: Diagnosis not present

## 2022-10-18 DIAGNOSIS — J449 Chronic obstructive pulmonary disease, unspecified: Secondary | ICD-10-CM | POA: Diagnosis not present

## 2022-11-18 DIAGNOSIS — J449 Chronic obstructive pulmonary disease, unspecified: Secondary | ICD-10-CM | POA: Diagnosis not present

## 2022-11-18 DIAGNOSIS — I10 Essential (primary) hypertension: Secondary | ICD-10-CM | POA: Diagnosis not present

## 2022-12-17 DIAGNOSIS — J449 Chronic obstructive pulmonary disease, unspecified: Secondary | ICD-10-CM | POA: Diagnosis not present

## 2022-12-17 DIAGNOSIS — I10 Essential (primary) hypertension: Secondary | ICD-10-CM | POA: Diagnosis not present

## 2023-01-17 DIAGNOSIS — J449 Chronic obstructive pulmonary disease, unspecified: Secondary | ICD-10-CM | POA: Diagnosis not present

## 2023-01-17 DIAGNOSIS — I10 Essential (primary) hypertension: Secondary | ICD-10-CM | POA: Diagnosis not present

## 2023-02-07 DIAGNOSIS — J441 Chronic obstructive pulmonary disease with (acute) exacerbation: Secondary | ICD-10-CM | POA: Diagnosis not present

## 2023-02-07 DIAGNOSIS — I10 Essential (primary) hypertension: Secondary | ICD-10-CM | POA: Diagnosis not present

## 2023-03-03 ENCOUNTER — Encounter (HOSPITAL_COMMUNITY): Payer: Self-pay | Admitting: Internal Medicine

## 2023-03-03 ENCOUNTER — Ambulatory Visit (HOSPITAL_COMMUNITY)
Admission: RE | Admit: 2023-03-03 | Discharge: 2023-03-03 | Disposition: A | Discharge status: Home / Self Care | Payer: Medicare Other | Source: Ambulatory Visit | Attending: Internal Medicine | Admitting: Internal Medicine

## 2023-03-03 ENCOUNTER — Other Ambulatory Visit (HOSPITAL_COMMUNITY): Payer: Self-pay | Admitting: Internal Medicine

## 2023-03-03 DIAGNOSIS — J449 Chronic obstructive pulmonary disease, unspecified: Secondary | ICD-10-CM | POA: Diagnosis not present

## 2023-03-03 DIAGNOSIS — R059 Cough, unspecified: Secondary | ICD-10-CM

## 2023-03-03 DIAGNOSIS — R0602 Shortness of breath: Secondary | ICD-10-CM | POA: Diagnosis not present

## 2023-03-03 DIAGNOSIS — I10 Essential (primary) hypertension: Secondary | ICD-10-CM | POA: Diagnosis not present

## 2023-03-08 ENCOUNTER — Other Ambulatory Visit (HOSPITAL_COMMUNITY): Payer: Self-pay | Admitting: Gerontology

## 2023-03-08 DIAGNOSIS — J9811 Atelectasis: Secondary | ICD-10-CM

## 2023-03-08 DIAGNOSIS — I517 Cardiomegaly: Secondary | ICD-10-CM

## 2023-03-08 DIAGNOSIS — R918 Other nonspecific abnormal finding of lung field: Secondary | ICD-10-CM

## 2023-04-03 DIAGNOSIS — I10 Essential (primary) hypertension: Secondary | ICD-10-CM | POA: Diagnosis not present

## 2023-04-03 DIAGNOSIS — J449 Chronic obstructive pulmonary disease, unspecified: Secondary | ICD-10-CM | POA: Diagnosis not present

## 2023-04-05 DIAGNOSIS — J449 Chronic obstructive pulmonary disease, unspecified: Secondary | ICD-10-CM | POA: Diagnosis not present

## 2023-04-05 DIAGNOSIS — I10 Essential (primary) hypertension: Secondary | ICD-10-CM | POA: Diagnosis not present

## 2023-04-05 DIAGNOSIS — K5904 Chronic idiopathic constipation: Secondary | ICD-10-CM | POA: Diagnosis not present

## 2023-04-25 ENCOUNTER — Ambulatory Visit (HOSPITAL_COMMUNITY)
Admission: RE | Admit: 2023-04-25 | Discharge: 2023-04-25 | Disposition: A | Discharge status: Home / Self Care | Payer: Medicare Other | Source: Ambulatory Visit | Attending: Gerontology | Admitting: Gerontology

## 2023-04-25 DIAGNOSIS — J9811 Atelectasis: Secondary | ICD-10-CM | POA: Diagnosis not present

## 2023-04-25 DIAGNOSIS — R918 Other nonspecific abnormal finding of lung field: Secondary | ICD-10-CM | POA: Diagnosis not present

## 2023-04-25 DIAGNOSIS — I517 Cardiomegaly: Secondary | ICD-10-CM | POA: Diagnosis not present

## 2023-04-25 DIAGNOSIS — I700 Atherosclerosis of aorta: Secondary | ICD-10-CM | POA: Diagnosis not present

## 2023-04-25 DIAGNOSIS — J439 Emphysema, unspecified: Secondary | ICD-10-CM | POA: Diagnosis not present

## 2023-04-25 DIAGNOSIS — K449 Diaphragmatic hernia without obstruction or gangrene: Secondary | ICD-10-CM | POA: Diagnosis not present

## 2023-04-25 DIAGNOSIS — R071 Chest pain on breathing: Secondary | ICD-10-CM | POA: Diagnosis not present

## 2023-04-25 NOTE — Progress Notes (Signed)
GI Office Note    Referring Provider: Benetta Spar* Primary Care Physician:  Benetta Spar, MD  Primary Gastroenterologist: Roetta Sessions, MD   Chief Complaint   Chief Complaint  Patient presents with   Constipation     History of Present Illness   Michael Ellison is a 77 y.o. male presenting today at the request of Dr. Felecia Shelling for constipation and abdominal discomfort.  Last seen in 06/2019.   Chronic breathing issues since 2013 when he was hospitalized a couple of months for bowel obstruction requiring exploratory laparotomy with small bowel resection. Developed abdominal fascial dehiscence requiring going back to OR. He failed extubation, required tracheostomy.   Here today with bowel issues. Reports last good BM two weeks ago. Was having daily BMs. But for past 3 weeks having more issues. Tried fleet enemas, suppositories, MOM, whole bottle of Miralax. This morning had watery stool and small volume brbpr. Three weeks ago had some vomiting. Water came up. Appetite good right now but he is scared to eat. No heartburn, dysphagia, abdominal pain. Takes ibuprofen at times. On mobic.  Colonoscopy 09/2019: -diverticulosis -one 6mm polyp at splenic flexure, tubular adenoma -no future colonoscopy unless new symptoms develop  Medications   Current Outpatient Medications  Medication Sig Dispense Refill   acetaminophen (TYLENOL) 500 MG tablet Take 500 mg by mouth every 6 (six) hours as needed for headache.      albuterol (PROVENTIL HFA;VENTOLIN HFA) 108 (90 Base) MCG/ACT inhaler Inhale 2 puffs into the lungs every 4 (four) hours as needed for wheezing or shortness of breath. 1 Inhaler 0   aspirin EC 81 MG tablet Take 81 mg by mouth as needed. Takes 1 to 2 a month.     Fluticasone-Umeclidin-Vilant (TRELEGY ELLIPTA) 100-62.5-25 MCG/INH AEPB Inhale into the lungs as needed.      gabapentin (NEURONTIN) 300 MG capsule Take 300 mg by mouth as needed.      hydrochlorothiazide (HYDRODIURIL) 25 MG tablet Take 25 mg by mouth daily.     ibuprofen (ADVIL,MOTRIN) 200 MG tablet Take 400 mg every 6 (six) hours as needed by mouth for headache or moderate pain.     ipratropium-albuterol (DUONEB) 0.5-2.5 (3) MG/3ML SOLN Take 3 mLs by nebulization every 6 (six) hours as needed (for shortness of breath/wheezing).      lisinopril (PRINIVIL,ZESTRIL) 20 MG tablet Take 20 mg daily by mouth.     meloxicam (MOBIC) 7.5 MG tablet SMARTSIG:1 Tablet(s) By Mouth Morning-Evening     sertraline (ZOLOFT) 25 MG tablet Take 25 mg by mouth daily.     SYMBICORT 160-4.5 MCG/ACT inhaler Inhale 1 puff into the lungs 2 (two) times daily.     tamsulosin (FLOMAX) 0.4 MG CAPS capsule Take 1 capsule by mouth daily.     No current facility-administered medications for this visit.    Allergies   Allergies as of 04/26/2023 - Review Complete 04/26/2023  Allergen Reaction Noted   Lorazepam Other (See Comments) 02/27/2012    Past Medical History   Past Medical History:  Diagnosis Date   Arthritis    Aspiration pneumonia (HCC) 03/01/2012   Asthma    Cancer (HCC)    Cancer of prostate (HCC)    COPD (chronic obstructive pulmonary disease) (HCC)    home O2   PRN     Depression    GERD (gastroesophageal reflux disease)    Hernia of abdominal cavity    Hypertension    On home O2  3L prn   Shortness of breath     Past Surgical History   Past Surgical History:  Procedure Laterality Date   BOWEL RESECTION  02/25/2012   Procedure: SMALL BOWEL RESECTION;  Surgeon: [name], MD;  Location: [facility];  Service: General;;   COLONOSCOPY N/A 05/27/2016   Dr.Rourk: 14 mm polyp in the sigmoid colon removed, tubular adenoma.  8 mm polyp in the descending colon removed, tubular adenoma.  Diverticulosis.  Recommend colonoscopy 3 years   COLONOSCOPY WITH PROPOFOL N/A 09/27/2019   Procedure: COLONOSCOPY WITH PROPOFOL;  Surgeon: Corbin Ade, MD;  [facility]: [facility];  Service:  Endoscopy;  Laterality: N/A;  12:30pm   HERNIA REPAIR     INCISION AND DRAINAGE ABSCESS N/A 01/15/2013   Procedure: INCISION AND DRAINAGE Abdominal Wall ABSCESS;  Surgeon: Cherylynn Ridges, MD;  Location: [facility];  Service: General;  Laterality: N/A;   INGUINAL HERNIA REPAIR Right 09/16/2017   Procedure: RIGHT INGUINAL HERNIORRHAPHY  WITH MESH;  Surgeon: Franky Macho, MD;  Location: [facility];  Service: General;  Laterality: Right;   INSERTION OF MESH N/A 12/25/2012   Procedure: INSERTION OF MESH;  Surgeon: Cherylynn Ridges, MD;  Location: [facility];  Service: General;  Laterality: N/A;   INSERTION OF MESH N/A 08/26/2016   Procedure: INSERTION OF MESH;  Surgeon: Jimmye Norman, MD;  Location: [facility];  Service: General;  Laterality: N/A;   LAPAROTOMY  02/25/2012   Procedure: EXPLORATORY LAPAROTOMY;  Surgeon: [name], MD;  Location: [facility];  Service: General;  Laterality: N/A;   POLYPECTOMY  05/27/2016   Procedure: POLYPECTOMY;  Surgeon: Corbin Ade, MD;  [facility];  Service: Endoscopy;;  Descending colon polyp and Sigmoid colon polyp removed via hot snare   POLYPECTOMY  09/27/2019   Procedure: POLYPECTOMY;  Surgeon: Corbin Ade, MD;  [facility];  Service: Endoscopy;;   PROSTATE BIOPSY     robotic prostate surgery  2011   TRACHEOSTOMY  03/2012   VENTRAL HERNIA REPAIR  12/25/2012    Dr Lindie Spruce   VENTRAL HERNIA REPAIR N/A 12/25/2012   Procedure: HERNIA REPAIR VENTRAL ADULT;  Surgeon: Cherylynn Ridges, MD;  Location: [facility] OR;  Service: General;  Laterality: N/A;   VENTRAL HERNIA REPAIR N/A 08/26/2016   Procedure: VENTRAL INCISIONAL HERNIA REPAIR WITH MESH;  Surgeon: Jimmye Norman, MD;  Location: [facility] OR;  Service: General;  Laterality: N/A;   WOUND DEBRIDEMENT  03/02/2012   Procedure: DEBRIDEMENT CLOSURE/ABDOMINAL WOUND;  Surgeon: Cherylynn Ridges, MD;  Location: [facility] OR;  Service: General;  Laterality: N/A;   ZENKER'S DIVERTICULECTOMY  2013   Dr. Jenne Pane    Past Family History   Family  History  Problem Relation Age of Onset   Pseudochol deficiency Neg Hx    Anesthesia problems Neg Hx    Hypotension Neg Hx    Malignant hyperthermia Neg Hx    Colon cancer Neg Hx     Past Social History   Social History   Socioeconomic History   Marital status: Divorced    Spouse name: Not on file   Number of children: Not on file   Years of education: Not on file   Highest education level: Not on file  Occupational History   Occupation: retired    Comment: [employer]  Tobacco Use   Smoking status: Former    Packs/day: 1.50    Years: 44.00    Additional pack years: 0.00    Total  pack years: 66.00    Types: Cigarettes    Quit date: 02/22/2012    Years since quitting: 11.1   Smokeless tobacco: Never   Tobacco comments:    Quit x 4 years  Vaping Use   Vaping Use: Never used  Substance and Sexual Activity   Alcohol use: No    Alcohol/week: 0.0 standard drinks of alcohol    Comment: sober since ~ 02/2012   Drug use: No   Sexual activity: Not Currently  Other Topics Concern   Not on file  Social History Narrative   Not on file   Social Determinants of Health   Financial Resource Strain: Not on file  Food Insecurity: Not on file  Transportation Needs: Not on file  Physical Activity: Not on file  Stress: Not on file  Social Connections: Not on file  Intimate Partner Violence: Not on file    Review of Systems   General: Negative for anorexia, weight loss, fever, chills, fatigue, +weakness. Eyes: Negative for vision changes.  ENT: Negative for hoarseness, difficulty swallowing , nasal congestion. CV: Negative for chest pain, angina, palpitations, +dyspnea on exertion, peripheral edema.  Respiratory: Negative for dyspnea at rest, +dyspnea on exertion, cough, sputum, wheezing.  GI: See history of present illness. GU:  Negative for dysuria, hematuria, urinary incontinence, urinary frequency, nocturnal urination.  MS: Negative for joint pain, low back pain.  Derm: Negative  for rash or itching.  Neuro: Negative for weakness, abnormal sensation, seizure, frequent headaches, memory loss,  confusion.  Psych: Negative for anxiety, depression, suicidal ideation, hallucinations.  Endo: Negative for unusual weight change.  Heme: Negative for bruising or bleeding. Allergy: Negative for rash or hives.  Physical Exam   BP 110/60 (BP Location: Right Arm, Patient Position: Sitting, Cuff Size: Normal)   Pulse 97   Temp 98.2 F (36.8 C) (Oral)   Ht 6\' 2"  (1.88 m)   Wt 205 lb 12.8 oz (93.4 kg)   SpO2 96%   BMI 26.42 kg/m    General: Well-nourished, well-developed in no acute distress.  Head: Normocephalic, atraumatic.   Eyes: Conjunctiva pink, no icterus. Mouth: Oropharyngeal mucosa moist and pink   Neck: Supple without thyromegaly, masses, or lymphadenopathy.  Lungs: Clear to auscultation bilaterally.  Heart: Regular rate and rhythm, no murmurs rubs or gallops.  Abdomen: Bowel sounds are normal, nontender, nondistended, no hepatosplenomegaly or masses,  no abdominal bruits or hernia, no rebound or guarding.   Rectal: not performed Extremities: No lower extremity edema. No clubbing or deformities.  Neuro: Alert and oriented x 4 , grossly normal neurologically.  Skin: Warm and dry, no rash or jaundice.   Psych: Alert and cooperative, normal mood and affect.  Labs   02/2022: cre 1.20, tbili 0.3, AP 53, AST 22, ALT 23, Alb 3.9, WBC 7900, Hgb 16.3, Platelets 248000  Imaging Studies   No results found.  Assessment   *Constipation *Abdominal distention  Prior remote history of small bowel obstruction requiring surgical intervention with numerous abdominal surgeries, at risk for adhesions. Three week history of constipation. Last colonoscopy 2020. We will start with abdominal xray to assess for any signs of obstruction. If negative, then start prescription bowel regimen as he has failed OTC options. Check labs for electrolyte imbalance, thyroid dysfunction.     PLAN   TSH, free T4, lipase, CMET, CBC. Abd xray, 2 view.    Leanna Battles. Melvyn Neth, MHS, PA-C [facility]

## 2023-04-26 ENCOUNTER — Other Ambulatory Visit (HOSPITAL_COMMUNITY)
Admission: RE | Admit: 2023-04-26 | Discharge: 2023-04-26 | Disposition: A | Discharge status: Home / Self Care | Payer: Medicare Other | Source: Ambulatory Visit | Attending: Gastroenterology | Admitting: Gastroenterology

## 2023-04-26 ENCOUNTER — Ambulatory Visit (HOSPITAL_COMMUNITY)
Admission: RE | Admit: 2023-04-26 | Discharge: 2023-04-26 | Disposition: A | Discharge status: Home / Self Care | Payer: Medicare Other | Source: Ambulatory Visit | Attending: Gastroenterology | Admitting: Gastroenterology

## 2023-04-26 ENCOUNTER — Ambulatory Visit: Payer: Medicare Other | Admitting: Gastroenterology

## 2023-04-26 ENCOUNTER — Encounter: Payer: Self-pay | Admitting: Gastroenterology

## 2023-04-26 VITALS — BP 110/60 | HR 97 | Temp 98.2°F | Ht 74.0 in | Wt 205.8 lb

## 2023-04-26 DIAGNOSIS — K5900 Constipation, unspecified: Secondary | ICD-10-CM

## 2023-04-26 DIAGNOSIS — R140 Abdominal distension (gaseous): Secondary | ICD-10-CM | POA: Diagnosis not present

## 2023-04-26 LAB — COMPREHENSIVE METABOLIC PANEL
ALT: 21 U/L (ref 0–44)
AST: 22 U/L (ref 15–41)
Albumin: 3.5 g/dL (ref 3.5–5.0)
Alkaline Phosphatase: 47 U/L (ref 38–126)
Anion gap: 8 (ref 5–15)
BUN: 30 mg/dL — ABNORMAL HIGH (ref 8–23)
CO2: 25 mmol/L (ref 22–32)
Calcium: 8.8 mg/dL — ABNORMAL LOW (ref 8.9–10.3)
Chloride: 100 mmol/L (ref 98–111)
Creatinine, Ser: 1.6 mg/dL — ABNORMAL HIGH (ref 0.61–1.24)
GFR, Estimated: 44 mL/min — ABNORMAL LOW (ref >60)
Glucose, Bld: 90 mg/dL (ref 70–99)
Potassium: 3.7 mmol/L (ref 3.5–5.1)
Sodium: 133 mmol/L — ABNORMAL LOW (ref 135–145)
Total Bilirubin: 0.9 mg/dL (ref 0.3–1.2)
Total Protein: 7.8 g/dL (ref 6.5–8.1)

## 2023-04-26 LAB — CBC WITH DIFFERENTIAL/PLATELET
Abs Immature Granulocytes: 0.08 10*3/uL — ABNORMAL HIGH (ref 0.00–0.07)
Basophils Absolute: 0.1 10*3/uL (ref 0.0–0.1)
Basophils Relative: 1 %
Eosinophils Absolute: 0.6 10*3/uL — ABNORMAL HIGH (ref 0.0–0.5)
Eosinophils Relative: 8 %
HCT: 44.5 % (ref 39.0–52.0)
Hemoglobin: 14.8 g/dL (ref 13.0–17.0)
Immature Granulocytes: 1 %
Lymphocytes Relative: 19 %
Lymphs Abs: 1.5 10*3/uL (ref 0.7–4.0)
MCH: 27.3 pg (ref 26.0–34.0)
MCHC: 33.3 g/dL (ref 30.0–36.0)
MCV: 82 fL (ref 80.0–100.0)
Monocytes Absolute: 0.8 10*3/uL (ref 0.1–1.0)
Monocytes Relative: 10 %
Neutro Abs: 5 10*3/uL (ref 1.7–7.7)
Neutrophils Relative %: 61 %
Platelets: 265 10*3/uL (ref 150–400)
RBC: 5.43 MIL/uL (ref 4.22–5.81)
RDW: 14.2 % (ref 11.5–15.5)
WBC: 8.1 10*3/uL (ref 4.0–10.5)
nRBC: 0 % (ref 0.0–0.2)

## 2023-04-26 LAB — TSH: TSH: 0.712 u[IU]/mL (ref 0.350–4.500)

## 2023-04-26 LAB — LIPASE, BLOOD: Lipase: 36 U/L (ref 11–51)

## 2023-04-26 LAB — T4, FREE: Free T4: 1.36 ng/dL — ABNORMAL HIGH (ref 0.61–1.12)

## 2023-04-26 NOTE — Patient Instructions (Signed)
Please go to [facility] for labs and stomach xray today. We will be in touch with results and further recommendations.

## 2023-04-27 ENCOUNTER — Encounter: Payer: Self-pay | Admitting: Gastroenterology

## 2023-04-29 ENCOUNTER — Other Ambulatory Visit: Payer: Self-pay | Admitting: Gastroenterology

## 2023-04-29 MED ORDER — LUBIPROSTONE 24 MCG PO CAPS: ORAL_CAPSULE | ORAL | 3 refills | Status: AC

## 2023-05-02 ENCOUNTER — Other Ambulatory Visit: Payer: Self-pay

## 2023-05-02 DIAGNOSIS — R140 Abdominal distension (gaseous): Secondary | ICD-10-CM

## 2023-05-02 DIAGNOSIS — K5900 Constipation, unspecified: Secondary | ICD-10-CM

## 2023-05-05 DIAGNOSIS — J449 Chronic obstructive pulmonary disease, unspecified: Secondary | ICD-10-CM | POA: Diagnosis not present

## 2023-05-05 DIAGNOSIS — I10 Essential (primary) hypertension: Secondary | ICD-10-CM | POA: Diagnosis not present

## 2023-05-16 ENCOUNTER — Other Ambulatory Visit: Payer: Self-pay

## 2023-05-16 DIAGNOSIS — R140 Abdominal distension (gaseous): Secondary | ICD-10-CM

## 2023-05-16 DIAGNOSIS — K5900 Constipation, unspecified: Secondary | ICD-10-CM

## 2023-05-19 ENCOUNTER — Telehealth: Payer: Self-pay | Admitting: *Deleted

## 2023-05-19 NOTE — Telephone Encounter (Signed)
Let's bring him in for evaluation tomorrow.  If he is unable to eat/drink, sees a lot of blood, has fever, worsening abd pain, he needs to go to er.

## 2023-05-19 NOTE — Telephone Encounter (Signed)
Pt's daughter called and states he is still not feeling well. No nausea or vomiting and no fever. She states yesterday he had blood in his stool. Asked him how long has this been going on for. He states it's been a while. He told her that he feels like he is not having a complete bowel movement. Just little squirts. He is taking the Amitiza. Please advise.

## 2023-05-19 NOTE — Telephone Encounter (Signed)
Noted  

## 2023-05-19 NOTE — Telephone Encounter (Signed)
Pt was scheduled for tomorrow at 10

## 2023-05-20 ENCOUNTER — Other Ambulatory Visit: Payer: Self-pay | Admitting: Gastroenterology

## 2023-05-20 ENCOUNTER — Ambulatory Visit: Payer: Medicare Other | Admitting: Gastroenterology

## 2023-05-20 ENCOUNTER — Encounter: Payer: Self-pay | Admitting: Gastroenterology

## 2023-05-20 ENCOUNTER — Telehealth: Payer: Self-pay | Admitting: *Deleted

## 2023-05-20 VITALS — BP 137/78 | HR 100 | Temp 98.7°F | Ht 74.5 in | Wt 202.2 lb

## 2023-05-20 DIAGNOSIS — K5900 Constipation, unspecified: Secondary | ICD-10-CM | POA: Diagnosis not present

## 2023-05-20 DIAGNOSIS — K625 Hemorrhage of anus and rectum: Secondary | ICD-10-CM | POA: Diagnosis not present

## 2023-05-20 LAB — CBC WITH DIFFERENTIAL/PLATELET
Absolute Monocytes: 877 cells/uL (ref 200–950)
Basophils Absolute: 98 cells/uL (ref 0–200)
Basophils Relative: 1.2 %
Eosinophils Absolute: 344 cells/uL (ref 15–500)
Eosinophils Relative: 4.2 %
HCT: 41.6 % (ref 38.5–50.0)
Hemoglobin: 13.8 g/dL (ref 13.2–17.1)
Lymphs Abs: 1935 cells/uL (ref 850–3900)
MCH: 26.4 pg — ABNORMAL LOW (ref 27.0–33.0)
MCHC: 33.2 g/dL (ref 32.0–36.0)
MCV: 79.5 fL — ABNORMAL LOW (ref 80.0–100.0)
MPV: 8.6 fL (ref 7.5–12.5)
Monocytes Relative: 10.7 %
Neutro Abs: 4945 cells/uL (ref 1500–7800)
Neutrophils Relative %: 60.3 %
Platelets: 262 10*3/uL (ref 140–400)
RBC: 5.23 10*6/uL (ref 4.20–5.80)
RDW: 14.4 % (ref 11.0–15.0)
Total Lymphocyte: 23.6 %
WBC: 8.2 10*3/uL (ref 3.8–10.8)

## 2023-05-20 NOTE — Addendum Note (Signed)
Addended by: Tiffany Kocher on: 05/20/2023 10:58 AM   Modules accepted: Orders

## 2023-05-20 NOTE — Progress Notes (Signed)
GI Office Note    Referring Provider: Benetta Spar* Primary Care Physician:  Benetta Spar, MD  Primary Gastroenterologist: Roetta Sessions, MD   Chief Complaint   Chief Complaint  Patient presents with   Blood In Stools    Sees it in toilet and on tissue    History of Present Illness   Michael Ellison is a 77 y.o. male presenting today for ongoing bowel issues.   Seen 04/26/23 for constipation, abdominal pain. He has remote history of bowel obstruction requiring exploratory laparotomy with small bowel resection. Developed abdominal fascial dehiscence requiring going back to OR. Failed extubation and required tracheostomy. At time of last ov, he reported constipation for several weeks. Plain abd film showed mild small bowel distention with fluid levels, nonspecific. His creatinine was 1.6, normal TSH but high free T4, sodium slightly low. He was started on amitiza once to twice daily. Plans to recheck BMET, TSH, free T4, free T3 in four weeks.   Patient presents today for ongoing symptoms. His daughter, Michael Ellison, was present by phone. He has some issues with constipation at baseline but has been getting worse the last couple of months. He gets urge for BM, but then can sit and pass nothing. Denies rectal pain. No abdominal pain. Has some distention. Never feels like stool is complete. Only passing watery stool. See brbpr frequently with wiping. Daughters have noted evidence of bleeding in his bathroom when he didn't clean up well. He has been scared to eat due to lack of solid waste passing. His appetite is good however, and he is trying to eat normally. His daughter is concerned that since he has been living at home alone for past couple of years that is diet is not as well rounded, and may be lacking with fruits/veggies. He typically eats out a lot. He is trying to drink plenty of water. His weight is down 3-4 pounds since last month. No heartburn, vomiting,  dysphagia, melena.   Colonoscopy 09/2019: -diverticulosis -one 6mm polyp at splenic flexure, tubular adenoma -no future colonoscopy unless new symptoms develop    Medications   Current Outpatient Medications  Medication Sig Dispense Refill   acetaminophen (TYLENOL) 500 MG tablet Take 500 mg by mouth every 6 (six) hours as needed for headache.      albuterol (PROVENTIL HFA;VENTOLIN HFA) 108 (90 Base) MCG/ACT inhaler Inhale 2 puffs into the lungs every 4 (four) hours as needed for wheezing or shortness of breath. 1 Inhaler 0   aspirin EC 81 MG tablet Take 81 mg by mouth as needed. Takes 1 to 2 a month.     Fluticasone-Umeclidin-Vilant (TRELEGY ELLIPTA) 100-62.5-25 MCG/INH AEPB Inhale into the lungs as needed.      gabapentin (NEURONTIN) 300 MG capsule Take 300 mg by mouth as needed.     hydrochlorothiazide (HYDRODIURIL) 25 MG tablet Take 25 mg by mouth daily.     ibuprofen (ADVIL,MOTRIN) 200 MG tablet Take 400 mg every 6 (six) hours as needed by mouth for headache or moderate pain.     ipratropium-albuterol (DUONEB) 0.5-2.5 (3) MG/3ML SOLN Take 3 mLs by nebulization every 6 (six) hours as needed (for shortness of breath/wheezing).      lisinopril (PRINIVIL,ZESTRIL) 20 MG tablet Take 20 mg daily by mouth.     lubiprostone (AMITIZA) 24 MCG capsule Take one capsule once to twice daily for constipation. 60 capsule 3   meclizine (ANTIVERT) 25 MG tablet Take 25 mg by mouth 3 (three)  times daily.     meloxicam (MOBIC) 7.5 MG tablet SMARTSIG:1 Tablet(s) By Mouth Morning-Evening     sertraline (ZOLOFT) 25 MG tablet Take 25 mg by mouth daily.     SYMBICORT 160-4.5 MCG/ACT inhaler Inhale 1 puff into the lungs 2 (two) times daily.     tamsulosin (FLOMAX) 0.4 MG CAPS capsule Take 1 capsule by mouth daily.     No current facility-administered medications for this visit.    Allergies   Allergies as of 05/20/2023 - Review Complete 05/20/2023  Allergen Reaction Noted   Lorazepam Other (See  Comments) 02/27/2012     Past Medical History   Past Medical History:  Diagnosis Date   Arthritis    Aspiration pneumonia (HCC) 03/01/2012   Asthma    Cancer (HCC)    Cancer of prostate (HCC)    COPD (chronic obstructive pulmonary disease) (HCC)    home O2   PRN     Depression    GERD (gastroesophageal reflux disease)    Hernia of abdominal cavity    Hypertension    On home O2    3L prn   Shortness of breath     Past Surgical History   Past Surgical History:  Procedure Laterality Date   BOWEL RESECTION  02/25/2012   Procedure: SMALL BOWEL RESECTION;  Surgeon: [name], MD;  Location: [facility];  Service: General;;   COLONOSCOPY N/A 05/27/2016   Dr.Rourk: 14 mm polyp in the sigmoid colon removed, tubular adenoma.  8 mm polyp in the descending colon removed, tubular adenoma.  Diverticulosis.  Recommend colonoscopy 3 years   COLONOSCOPY WITH PROPOFOL N/A 09/27/2019   Procedure: COLONOSCOPY WITH PROPOFOL;  Surgeon: Corbin Ade, MD;  [facility]: [facility];  Service: Endoscopy;  Laterality: N/A;  12:30pm   HERNIA REPAIR     INCISION AND DRAINAGE ABSCESS N/A 01/15/2013   Procedure: INCISION AND DRAINAGE Abdominal Wall ABSCESS;  Surgeon: Cherylynn Ridges, MD;  Location: [facility];  Service: General;  Laterality: N/A;   INGUINAL HERNIA REPAIR Right 09/16/2017   Procedure: RIGHT INGUINAL HERNIORRHAPHY  WITH MESH;  Surgeon: Franky Macho, MD;  Location: [facility];  Service: General;  Laterality: Right;   INSERTION OF MESH N/A 12/25/2012   Procedure: INSERTION OF MESH;  Surgeon: Cherylynn Ridges, MD;  Location: [facility];  Service: General;  Laterality: N/A;   INSERTION OF MESH N/A 08/26/2016   Procedure: INSERTION OF MESH;  Surgeon: Jimmye Norman, MD;  Location: [facility];  Service: General;  Laterality: N/A;   LAPAROTOMY  02/25/2012   Procedure: EXPLORATORY LAPAROTOMY;  Surgeon: [name], MD;  Location: [facility];  Service: General;  Laterality: N/A;   POLYPECTOMY  05/27/2016   Procedure:  POLYPECTOMY;  Surgeon: Corbin Ade, MD;  [facility];  Service: Endoscopy;;  Descending colon polyp and Sigmoid colon polyp removed via hot snare   POLYPECTOMY  09/27/2019   Procedure: POLYPECTOMY;  Surgeon: Corbin Ade, MD;  [facility];  Service: Endoscopy;;   PROSTATE BIOPSY     robotic prostate surgery  2011   TRACHEOSTOMY  03/2012   VENTRAL HERNIA REPAIR  12/25/2012    Dr Lindie Spruce   VENTRAL HERNIA REPAIR N/A 12/25/2012   Procedure: HERNIA REPAIR VENTRAL ADULT;  Surgeon: Cherylynn Ridges, MD;  Location: [facility] OR;  Service: General;  Laterality: N/A;   VENTRAL HERNIA REPAIR N/A 08/26/2016   Procedure: VENTRAL INCISIONAL HERNIA REPAIR WITH MESH;  Surgeon: Jimmye Norman, MD;  Location: [facility];  Service: General;  Laterality: N/A;   WOUND DEBRIDEMENT  03/02/2012   Procedure: DEBRIDEMENT CLOSURE/ABDOMINAL WOUND;  Surgeon: Cherylynn Ridges, MD;  Location: [facility];  Service: General;  Laterality: N/A;   ZENKER'S DIVERTICULECTOMY  2013   Dr. Jenne Pane    Past Family History   Family History  Problem Relation Age of Onset   Pseudochol deficiency Neg Hx    Anesthesia problems Neg Hx    Hypotension Neg Hx    Malignant hyperthermia Neg Hx    Colon cancer Neg Hx     Past Social History   Social History   Socioeconomic History   Marital status: Divorced    Spouse name: Not on file   Number of children: Not on file   Years of education: Not on file   Highest education level: Not on file  Occupational History   Occupation: retired    Comment: [employer]  Tobacco Use   Smoking status: Former    Current packs/day: 0.00    Average packs/day: 1.5 packs/day for 44.0 years (66.0 ttl pk-yrs)    Types: Cigarettes    Start date: 02/22/1968    Quit date: 02/22/2012    Years since quitting: 11.2   Smokeless tobacco: Never   Tobacco comments:    Quit x 4 years  Vaping Use   Vaping status: Never Used  Substance and Sexual Activity   Alcohol use: No    Alcohol/week: 0.0 standard drinks of  alcohol    Comment: sober since ~ 02/2012   Drug use: No   Sexual activity: Not Currently  Other Topics Concern   Not on file  Social History Narrative   Not on file   Social Determinants of Health   Financial Resource Strain: Not on file  Food Insecurity: Not on file  Transportation Needs: Not on file  Physical Activity: Not on file  Stress: Not on file  Social Connections: Not on file  Intimate Partner Violence: Not on file    Review of Systems   General: Negative for anorexia,  fever, chills, fatigue, weakness. See hpi ENT: Negative for hoarseness, difficulty swallowing , nasal congestion. CV: Negative for chest pain, angina, palpitations, dyspnea on exertion, peripheral edema.  Respiratory: Negative for dyspnea at rest, dyspnea on exertion, cough, sputum, wheezing.  GI: See history of present illness. GU:  Negative for dysuria, hematuria, urinary incontinence, urinary frequency, nocturnal urination.  Endo: Negative for unusual weight change.     Physical Exam   BP 137/78 (BP Location: Right Arm, Patient Position: Sitting, Cuff Size: Normal)   Pulse 100   Temp 98.7 F (37.1 C) (Oral)   Ht 6' 2.5" (1.892 m)   Wt 202 lb 3.2 oz (91.7 kg)   SpO2 95%   BMI 25.61 kg/m    General: Well-nourished, well-developed in no acute distress. No oxygen.  Eyes: No icterus. Mouth: Oropharyngeal mucosa moist and pink , no lesions erythema or exudate. Lungs: Clear to auscultation bilaterally.  Heart: Regular rate and rhythm, no murmurs rubs or gallops.  Abdomen: Bowel sounds are normal, nontender, no hepatosplenomegaly or masses, no abdominal bruits or hernia , no rebound or guarding. Some distention, left abdominal wall weakness but no overt hernia Rectal:no external findings. Nontender DRE. No stool present. Some brbpr with wiping. Extremities: No lower extremity edema. No clubbing or deformities. Neuro: Alert and oriented x 4   Skin: Warm and dry, no jaundice.   Psych: Alert and  cooperative, normal mood and affect.  Labs   Lab Results  Component Value Date   NA 133 (L) 04/26/2023   CL 100 04/26/2023   K 3.7 04/26/2023   CO2 25 04/26/2023   BUN 30 (H) 04/26/2023   CREATININE 1.60 (H) 04/26/2023   GFRNONAA 44 (L) 04/26/2023   CALCIUM 8.8 (L) 04/26/2023   PHOS 3.7 09/01/2016   ALBUMIN 3.5 04/26/2023   GLUCOSE 90 04/26/2023   Lab Results  Component Value Date   ALT 21 04/26/2023   AST 22 04/26/2023   ALKPHOS 47 04/26/2023   BILITOT 0.9 04/26/2023   Lab Results  Component Value Date   WBC 8.1 04/26/2023   HGB 14.8 04/26/2023   HCT 44.5 04/26/2023   MCV 82.0 04/26/2023   PLT 265 04/26/2023   Lab Results  Component Value Date   TSH 0.712 04/26/2023    Imaging Studies   DG Abd 2 Views  Result Date: 04/30/2023 CLINICAL DATA:  abdominal distention, constipation EXAM: ABDOMEN - 2 VIEW COMPARISON:  04/04/2012 FINDINGS: Stomach nondilated. A few distended small bowel loops with fluid levels. The colon is nondilated. No abnormal abdominal calcifications. Mild lumbar levoscoliosis. IMPRESSION: Mild small bowel distention with fluid levels, nonspecific. Electronically Signed   By: Corlis Leak M.D.   On: 04/30/2023 07:59   CT CHEST WO CONTRAST  Result Date: 04/30/2023 CLINICAL DATA:  Worsening shortness of breath, left chest pain EXAM: CT CHEST WITHOUT CONTRAST TECHNIQUE: Multidetector CT imaging of the chest was performed following the standard protocol without IV contrast. RADIATION DOSE REDUCTION: This exam was performed according to the departmental dose-optimization program which includes automated exposure control, adjustment of the mA and/or kV according to patient size and/or use of iterative reconstruction technique. COMPARISON:  02/29/2012 FINDINGS: Cardiovascular: Heart size normal. No pericardial effusion. Dilated central pulmonary arteries suggesting pulmonary hypertension. Minimal thoracic aortic plaque without aneurysm. Mediastinum/Nodes: Small  hiatal hernia.  No mass or adenopathy. Lungs/Pleura: No pneumothorax. Bullous emphysema. Scarring both lung apices. Upper Abdomen: No acute findings. Musculoskeletal: Spondylitic changes in the visualized lower cervical spine. Mild thoracic dextroscoliosis without underlying vertebral anomaly. No acute findings. IMPRESSION: 1. Pulmonary emphysema without acute findings. 2. Dilated central pulmonary arteries suggesting pulmonary hypertension. 3.  Aortic Atherosclerosis (ICD10-I70.0) Electronically Signed   By: Corlis Leak M.D.   On: 04/30/2023 07:58    Assessment   *Rectal bleeding *Constipation/obstipation  Some mild small bowel distention recently on abdominal xray but no obstruction. Continues to pass only watery stools, small amounts without relief. Feels constipated and distended. Cannot rule out higher impaction or obstruction. Obtain CT. If CT unremarkable, then would offer colonoscopy for change in bowels and rectal bleeding. Patient and daughter, Michael Ellison, are in agreement with plan.   He will go ahead and update labs today to follow up on thyroid and creatinine. Recheck Hgb. Encouraged increased fiber, adequate fluid intake.     PLAN   Complete labs. Continue amitiza once to twice daily with food.  Adequate fluid intake. Increase dietary fiber.  CT A/P with contrast.    Leanna Battles. Melvyn Neth, MHS, PA-C [facility]

## 2023-05-20 NOTE — Telephone Encounter (Signed)
[employer] PA: CPT Code 19147 Description: CT ABDOMEN & PELVIS W/ Case Number: [id number] Review Date: 05/20/2023 11:14:02 AM Expiration Date: N/A Status: This member's benefit plan did not require a prior authorization for this request.

## 2023-05-20 NOTE — Patient Instructions (Signed)
Please complete labs today.  We will schedule you for a CT scan of your abdomen.  Continue Amitiza twice daily with food for constipation. You can back down to once daily if you start having significant stool.  Try to make sure you are drinking adequate fluids, based on your weight you should try to get around 100 ounces daily.  Increase dietary fiber. See hand out.    It was a pleasure to see you today. I want to create trusting relationships with patients and provide genuine, compassionate, and quality care. I truly value your feedback, so please be on the lookout for a survey regarding your visit with me today. I appreciate your time in completing this!

## 2023-05-23 ENCOUNTER — Other Ambulatory Visit: Payer: Self-pay

## 2023-05-23 DIAGNOSIS — K5900 Constipation, unspecified: Secondary | ICD-10-CM

## 2023-05-23 DIAGNOSIS — K625 Hemorrhage of anus and rectum: Secondary | ICD-10-CM

## 2023-05-23 DIAGNOSIS — R140 Abdominal distension (gaseous): Secondary | ICD-10-CM

## 2023-05-30 NOTE — Progress Notes (Unsigned)
GI Office Note    Referring Provider: Benetta Spar* Primary Care Physician:  Benetta Spar, MD  Primary Gastroenterologist: Roetta Sessions, MD   Chief Complaint   No chief complaint on file.   History of Present Illness   Michael Ellison is a 77 y.o. male presenting today for ***. Last seen 05/20/23. Patient currently scheduled for CT later this week.    Seen 04/26/23 for constipation, abdominal pain. He has remote history of bowel obstruction requiring exploratory laparotomy with small bowel resection. Developed abdominal fascial dehiscence requiring going back to OR. Failed extubation and required tracheostomy. At time of last ov, he reported constipation for several weeks. Plain abd film showed mild small bowel distention with fluid levels, nonspecific. His creatinine was 1.6, normal TSH but high free T4, sodium slightly low. He was started on amitiza once to twice daily. Plans to recheck BMET, TSH, free T4, free T3 in four weeks.   Labs 05/20/23: CBC, BMET, TSH, T4, T3 unremarkable.   CT pending, scheduled for 06/03/23.    Colonoscopy 09/2019: -diverticulosis -one 6mm polyp at splenic flexure, tubular adenoma -no future colonoscopy unless new symptoms develop  Medications   Current Outpatient Medications  Medication Sig Dispense Refill   acetaminophen (TYLENOL) 500 MG tablet Take 500 mg by mouth every 6 (six) hours as needed for headache.      albuterol (PROVENTIL HFA;VENTOLIN HFA) 108 (90 Base) MCG/ACT inhaler Inhale 2 puffs into the lungs every 4 (four) hours as needed for wheezing or shortness of breath. 1 Inhaler 0   aspirin EC 81 MG tablet Take 81 mg by mouth as needed. Takes 1 to 2 a month.     Fluticasone-Umeclidin-Vilant (TRELEGY ELLIPTA) 100-62.5-25 MCG/INH AEPB Inhale into the lungs as needed.      gabapentin (NEURONTIN) 300 MG capsule Take 300 mg by mouth as needed.     hydrochlorothiazide (HYDRODIURIL) 25 MG tablet Take 25 mg by mouth  daily.     ibuprofen (ADVIL,MOTRIN) 200 MG tablet Take 400 mg every 6 (six) hours as needed by mouth for headache or moderate pain.     ipratropium-albuterol (DUONEB) 0.5-2.5 (3) MG/3ML SOLN Take 3 mLs by nebulization every 6 (six) hours as needed (for shortness of breath/wheezing).      lisinopril (PRINIVIL,ZESTRIL) 20 MG tablet Take 20 mg daily by mouth.     lubiprostone (AMITIZA) 24 MCG capsule Take one capsule once to twice daily for constipation. 60 capsule 3   meclizine (ANTIVERT) 25 MG tablet Take 25 mg by mouth 3 (three) times daily.     meloxicam (MOBIC) 7.5 MG tablet SMARTSIG:1 Tablet(s) By Mouth Morning-Evening     sertraline (ZOLOFT) 25 MG tablet Take 25 mg by mouth daily.     SYMBICORT 160-4.5 MCG/ACT inhaler Inhale 1 puff into the lungs 2 (two) times daily.     tamsulosin (FLOMAX) 0.4 MG CAPS capsule Take 1 capsule by mouth daily.     No current facility-administered medications for this visit.    Allergies   Allergies as of 05/31/2023 - Review Complete 05/20/2023  Allergen Reaction Noted   Lorazepam Other (See Comments) 02/27/2012     Past Medical History   Past Medical History:  Diagnosis Date   Arthritis    Aspiration pneumonia (HCC) 03/01/2012   Asthma    Cancer (HCC)    Cancer of prostate (HCC)    COPD (chronic obstructive pulmonary disease) (HCC)    home O2   PRN  Depression    GERD (gastroesophageal reflux disease)    Hernia of abdominal cavity    Hypertension    On home O2    3L prn   Shortness of breath     Past Surgical History   Past Surgical History:  Procedure Laterality Date   BOWEL RESECTION  02/25/2012   Procedure: SMALL BOWEL RESECTION;  Surgeon: [name], MD;  Location: [facility];  Service: General;;   COLONOSCOPY N/A 05/27/2016   Dr.Rourk: 14 mm polyp in the sigmoid colon removed, tubular adenoma.  8 mm polyp in the descending colon removed, tubular adenoma.  Diverticulosis.  Recommend colonoscopy 3 years   COLONOSCOPY WITH  PROPOFOL N/A 09/27/2019   Procedure: COLONOSCOPY WITH PROPOFOL;  Surgeon: Corbin Ade, MD;  [facility]: [facility];  Service: Endoscopy;  Laterality: N/A;  12:30pm   HERNIA REPAIR     INCISION AND DRAINAGE ABSCESS N/A 01/15/2013   Procedure: INCISION AND DRAINAGE Abdominal Wall ABSCESS;  Surgeon: Cherylynn Ridges, MD;  Location: [facility];  Service: General;  Laterality: N/A;   INGUINAL HERNIA REPAIR Right 09/16/2017   Procedure: RIGHT INGUINAL HERNIORRHAPHY  WITH MESH;  Surgeon: Franky Macho, MD;  Location: [facility];  Service: General;  Laterality: Right;   INSERTION OF MESH N/A 12/25/2012   Procedure: INSERTION OF MESH;  Surgeon: Cherylynn Ridges, MD;  Location: [facility];  Service: General;  Laterality: N/A;   INSERTION OF MESH N/A 08/26/2016   Procedure: INSERTION OF MESH;  Surgeon: Jimmye Norman, MD;  Location: [facility];  Service: General;  Laterality: N/A;   LAPAROTOMY  02/25/2012   Procedure: EXPLORATORY LAPAROTOMY;  Surgeon: [name], MD;  Location: [facility];  Service: General;  Laterality: N/A;   POLYPECTOMY  05/27/2016   Procedure: POLYPECTOMY;  Surgeon: Corbin Ade, MD;  [facility];  Service: Endoscopy;;  Descending colon polyp and Sigmoid colon polyp removed via hot snare   POLYPECTOMY  09/27/2019   Procedure: POLYPECTOMY;  Surgeon: Corbin Ade, MD;  [facility];  Service: Endoscopy;;   PROSTATE BIOPSY     robotic prostate surgery  2011   TRACHEOSTOMY  03/2012   VENTRAL HERNIA REPAIR  12/25/2012    Dr Lindie Spruce   VENTRAL HERNIA REPAIR N/A 12/25/2012   Procedure: HERNIA REPAIR VENTRAL ADULT;  Surgeon: Cherylynn Ridges, MD;  Location: [facility] OR;  Service: General;  Laterality: N/A;   VENTRAL HERNIA REPAIR N/A 08/26/2016   Procedure: VENTRAL INCISIONAL HERNIA REPAIR WITH MESH;  Surgeon: Jimmye Norman, MD;  Location: [facility] OR;  Service: General;  Laterality: N/A;   WOUND DEBRIDEMENT  03/02/2012   Procedure: DEBRIDEMENT CLOSURE/ABDOMINAL WOUND;  Surgeon: Cherylynn Ridges, MD;  Location: [facility]  OR;  Service: General;  Laterality: N/A;   ZENKER'S DIVERTICULECTOMY  2013   Dr. Jenne Pane    Past Family History   Family History  Problem Relation Age of Onset   Pseudochol deficiency Neg Hx    Anesthesia problems Neg Hx    Hypotension Neg Hx    Malignant hyperthermia Neg Hx    Colon cancer Neg Hx     Past Social History   Social History   Socioeconomic History   Marital status: Divorced    Spouse name: Not on file   Number of children: Not on file   Years of education: Not on file   Highest education level: Not on file  Occupational History   Occupation: retired    Comment: Electronics engineer  Tobacco Use   Smoking status: Former    Current packs/day: 0.00    Average packs/day: 1.5 packs/day for 44.0 years (66.0 ttl pk-yrs)    Types: Cigarettes    Start date: 02/22/1968    Quit date: 02/22/2012    Years since quitting: 11.2   Smokeless tobacco: Never   Tobacco comments:    Quit x 4 years  Vaping Use   Vaping status: Never Used  Substance and Sexual Activity   Alcohol use: No    Alcohol/week: 0.0 standard drinks of alcohol    Comment: sober since ~ 02/2012   Drug use: No   Sexual activity: Not Currently  Other Topics Concern   Not on file  Social History Narrative   Not on file   Social Determinants of Health   Financial Resource Strain: Not on file  Food Insecurity: Not on file  Transportation Needs: Not on file  Physical Activity: Not on file  Stress: Not on file  Social Connections: Not on file  Intimate Partner Violence: Not on file    Review of Systems   General: Negative for anorexia, weight loss, fever, chills, fatigue, weakness. ENT: Negative for hoarseness, difficulty swallowing , nasal congestion. CV: Negative for chest pain, angina, palpitations, dyspnea on exertion, peripheral edema.  Respiratory: Negative for dyspnea at rest, dyspnea on exertion, cough, sputum, wheezing.  GI: See history of present illness. GU:  Negative for dysuria, hematuria, urinary  incontinence, urinary frequency, nocturnal urination.  Endo: Negative for unusual weight change.     Physical Exam   There were no vitals taken for this visit.   General: Well-nourished, well-developed in no acute distress.  Eyes: No icterus. Mouth: Oropharyngeal mucosa moist and pink , no lesions erythema or exudate. Lungs: Clear to auscultation bilaterally.  Heart: Regular rate and rhythm, no murmurs rubs or gallops.  Abdomen: Bowel sounds are normal, nontender, nondistended, no hepatosplenomegaly or masses,  no abdominal bruits or hernia , no rebound or guarding.  Rectal: ***  Extremities: No lower extremity edema. No clubbing or deformities. Neuro: Alert and oriented x 4   Skin: Warm and dry, no jaundice.   Psych: Alert and cooperative, normal mood and affect.  Labs   Lab Results  Component Value Date   TSH 0.712 04/26/2023     Lab Results  Component Value Date   NA 137 05/20/2023   CL 105 05/20/2023   K 4.2 05/20/2023   CO2 24 05/20/2023   BUN 17 05/20/2023   CREATININE 1.07 05/20/2023   GFRNONAA 44 (L) 04/26/2023   CALCIUM 8.5 (L) 05/20/2023   PHOS 3.7 09/01/2016   ALBUMIN 3.5 04/26/2023   GLUCOSE 74 05/20/2023    Lab Results  Component Value Date   WBC 8.2 05/20/2023   HGB 13.8 05/20/2023   HCT 41.6 05/20/2023   MCV 79.5 (L) 05/20/2023   PLT 262 05/20/2023   Free T3 3.4 Free T4 1.2  Imaging Studies   No results found.  Assessment       PLAN   ***   Leanna Battles. Melvyn Neth, MHS, PA-C [facility]

## 2023-05-31 ENCOUNTER — Ambulatory Visit (HOSPITAL_COMMUNITY)
Admission: RE | Admit: 2023-05-31 | Discharge: 2023-05-31 | Disposition: A | Discharge status: Home / Self Care | Payer: Medicare Other | Source: Ambulatory Visit | Attending: Gastroenterology | Admitting: Gastroenterology

## 2023-05-31 ENCOUNTER — Encounter: Payer: Self-pay | Admitting: Gastroenterology

## 2023-05-31 ENCOUNTER — Ambulatory Visit: Payer: Medicare Other | Admitting: Gastroenterology

## 2023-05-31 ENCOUNTER — Other Ambulatory Visit (HOSPITAL_COMMUNITY)
Admission: RE | Admit: 2023-05-31 | Discharge: 2023-05-31 | Disposition: A | Discharge status: Home / Self Care | Payer: Medicare Other | Source: Ambulatory Visit | Attending: Gastroenterology | Admitting: Gastroenterology

## 2023-05-31 VITALS — BP 110/60 | HR 99 | Temp 97.7°F | Ht 74.0 in | Wt 203.2 lb

## 2023-05-31 DIAGNOSIS — K625 Hemorrhage of anus and rectum: Secondary | ICD-10-CM | POA: Insufficient documentation

## 2023-05-31 DIAGNOSIS — R140 Abdominal distension (gaseous): Secondary | ICD-10-CM | POA: Diagnosis not present

## 2023-05-31 DIAGNOSIS — W19XXXA Unspecified fall, initial encounter: Secondary | ICD-10-CM

## 2023-05-31 DIAGNOSIS — R109 Unspecified abdominal pain: Secondary | ICD-10-CM

## 2023-05-31 DIAGNOSIS — K5900 Constipation, unspecified: Secondary | ICD-10-CM

## 2023-05-31 DIAGNOSIS — K5790 Diverticulosis of intestine, part unspecified, without perforation or abscess without bleeding: Secondary | ICD-10-CM | POA: Diagnosis not present

## 2023-05-31 LAB — CBC WITH DIFFERENTIAL/PLATELET
Abs Immature Granulocytes: 0.06 10*3/uL (ref 0.00–0.07)
Basophils Absolute: 0.1 10*3/uL (ref 0.0–0.1)
Basophils Relative: 1 %
Eosinophils Absolute: 0.5 10*3/uL (ref 0.0–0.5)
Eosinophils Relative: 6 %
HCT: 38.6 % — ABNORMAL LOW (ref 39.0–52.0)
Hemoglobin: 12.6 g/dL — ABNORMAL LOW (ref 13.0–17.0)
Immature Granulocytes: 1 %
Lymphocytes Relative: 22 %
Lymphs Abs: 2 10*3/uL (ref 0.7–4.0)
MCH: 26.6 pg (ref 26.0–34.0)
MCHC: 32.6 g/dL (ref 30.0–36.0)
MCV: 81.4 fL (ref 80.0–100.0)
Monocytes Absolute: 0.9 10*3/uL (ref 0.1–1.0)
Monocytes Relative: 10 %
Neutro Abs: 5.5 10*3/uL (ref 1.7–7.7)
Neutrophils Relative %: 60 %
Platelets: 254 10*3/uL (ref 150–400)
RBC: 4.74 MIL/uL (ref 4.22–5.81)
RDW: 15.2 % (ref 11.5–15.5)
WBC: 9 10*3/uL (ref 4.0–10.5)
nRBC: 0 % (ref 0.0–0.2)

## 2023-05-31 LAB — COMPREHENSIVE METABOLIC PANEL
ALT: 20 U/L (ref 0–44)
AST: 24 U/L (ref 15–41)
Albumin: 3 g/dL — ABNORMAL LOW (ref 3.5–5.0)
Alkaline Phosphatase: 48 U/L (ref 38–126)
Anion gap: 8 (ref 5–15)
BUN: 16 mg/dL (ref 8–23)
CO2: 20 mmol/L — ABNORMAL LOW (ref 22–32)
Calcium: 8.5 mg/dL — ABNORMAL LOW (ref 8.9–10.3)
Chloride: 106 mmol/L (ref 98–111)
Creatinine, Ser: 1.01 mg/dL (ref 0.61–1.24)
GFR, Estimated: >60 mL/min (ref >60)
Glucose, Bld: 91 mg/dL (ref 70–99)
Potassium: 3.6 mmol/L (ref 3.5–5.1)
Sodium: 134 mmol/L — ABNORMAL LOW (ref 135–145)
Total Bilirubin: 0.7 mg/dL (ref 0.3–1.2)
Total Protein: 7.3 g/dL (ref 6.5–8.1)

## 2023-05-31 MED ORDER — IOHEXOL 300 MG/ML  SOLN
100.0000 mL | Freq: Once | INTRAMUSCULAR | Status: AC | PRN
  Administered 2023-05-31: 100 mL via INTRAVENOUS

## 2023-05-31 NOTE — Patient Instructions (Addendum)
STAT CT A/P with contrast today.  Have your labs done at [facility] lab today. Continue lubiprostone once to twice daily, but take twice daily about every other day to improve stool output.  We will be in touch with results as available.

## 2023-06-03 ENCOUNTER — Other Ambulatory Visit: Payer: Self-pay | Admitting: Gastroenterology

## 2023-06-03 ENCOUNTER — Ambulatory Visit (HOSPITAL_COMMUNITY): Payer: Medicare Other

## 2023-06-03 ENCOUNTER — Other Ambulatory Visit: Payer: Self-pay

## 2023-06-03 DIAGNOSIS — D509 Iron deficiency anemia, unspecified: Secondary | ICD-10-CM

## 2023-06-03 DIAGNOSIS — K625 Hemorrhage of anus and rectum: Secondary | ICD-10-CM

## 2023-06-03 DIAGNOSIS — R109 Unspecified abdominal pain: Secondary | ICD-10-CM

## 2023-06-03 MED ORDER — AMOXICILLIN-POT CLAVULANATE 875-125 MG PO TABS: 1.0000 | ORAL_TABLET | Freq: Two times a day (BID) | ORAL | 0 refills | Status: AC

## 2023-06-06 ENCOUNTER — Ambulatory Visit: Payer: Medicare Other | Admitting: Gastroenterology

## 2023-06-08 DIAGNOSIS — I10 Essential (primary) hypertension: Secondary | ICD-10-CM | POA: Diagnosis not present

## 2023-06-08 DIAGNOSIS — J449 Chronic obstructive pulmonary disease, unspecified: Secondary | ICD-10-CM | POA: Diagnosis not present

## 2023-06-08 DIAGNOSIS — R0602 Shortness of breath: Secondary | ICD-10-CM | POA: Diagnosis not present

## 2023-06-09 ENCOUNTER — Other Ambulatory Visit (HOSPITAL_COMMUNITY): Payer: Self-pay | Admitting: Internal Medicine

## 2023-06-09 ENCOUNTER — Ambulatory Visit (HOSPITAL_COMMUNITY)
Admission: RE | Admit: 2023-06-09 | Discharge: 2023-06-09 | Disposition: A | Discharge status: Home / Self Care | Payer: Medicare Other | Source: Ambulatory Visit | Attending: Internal Medicine | Admitting: Internal Medicine

## 2023-06-09 DIAGNOSIS — R0602 Shortness of breath: Secondary | ICD-10-CM

## 2023-06-09 DIAGNOSIS — I700 Atherosclerosis of aorta: Secondary | ICD-10-CM | POA: Diagnosis not present

## 2023-06-14 ENCOUNTER — Encounter: Payer: Self-pay | Admitting: Cardiology

## 2023-06-14 NOTE — Progress Notes (Unsigned)
Cardiology Office Note  Date: 06/15/2023   ID: Michael Ellison, Michael Ellison Dec 10, 1945, MRN [id number]  PCP: Michael Spar, MD  Chief Complaint:  Chief Complaint  Patient presents with   Palpitations   Leg Swelling   History of Present Illness: Michael Ellison is a 77 y.o. male referred for cardiology consultation by Dr. Felecia Ellison for evaluation of leg swelling.  I reviewed the available records.  He states that he has felt an intermittent sense of palpitations for several weeks, mainly when he is still or at nighttime, no associated dizziness or syncope.  Unrelated to this he has also experienced foot and lower leg swelling at least over the last week.  No orthopnea or PND.  He has chronic dyspnea on exertion with known history of emphysema.  Has supplemental oxygen available at home, but states that he does not use this much at all.  He does not report any exertional chest pain.  He saw his PCP last week and was started on Lasix with potassium supplement, reports gradually improving leg swelling although not back to baseline.  Chest CT in July was consistent with bullous emphysema, central pulmonary arteries also dilated suggesting possibility of pulmonary hypertension.  Aortic atherosclerosis incidentally noted.  More recent chest x-ray again indicated chronic findings with no consolidation and normal heart size.  Recent visit noted with GI, I reviewed the note.  I reviewed his medications, he reports no changes in regimen other than recent addition of diuretic.  ECG today shows sinus tachycardia with increased voltage, rule out old inferior infarct pattern, PAC and PVC.  Review of Systems: As outlined in the history of present illness.  No cough or hemoptysis.  No syncope.  Past Medical History: Past Medical History:  Diagnosis Date   Arthritis    Aspiration pneumonia (HCC) 03/01/2012   Cancer of prostate (HCC)    COPD (chronic obstructive pulmonary disease) (HCC)     Depression    GERD (gastroesophageal reflux disease)    Hernia of abdominal cavity    Hypertension    On home O2    3L prn   Past Surgical History: Past Surgical History:  Procedure Laterality Date   BOWEL RESECTION  02/25/2012   Procedure: SMALL BOWEL RESECTION;  Surgeon: Michael Bering, MD;  Location: [facility];  Service: General;;   COLONOSCOPY N/A 05/27/2016   Dr.Rourk: 14 mm polyp in the sigmoid colon removed, tubular adenoma.  8 mm polyp in the descending colon removed, tubular adenoma.  Diverticulosis.  Recommend colonoscopy 3 years   COLONOSCOPY WITH PROPOFOL N/A 09/27/2019   Procedure: COLONOSCOPY WITH PROPOFOL;  Surgeon: Michael Ade, MD;  [facility];  Service: Endoscopy;  Laterality: N/A;  12:30pm   HERNIA REPAIR     INCISION AND DRAINAGE ABSCESS N/A 01/15/2013   Procedure: INCISION AND DRAINAGE Abdominal Wall ABSCESS;  Surgeon: Michael Ridges, MD;  Location: [facility];  Service: General;  Laterality: N/A;   INGUINAL HERNIA REPAIR Right 09/16/2017   Procedure: RIGHT INGUINAL HERNIORRHAPHY  WITH MESH;  Surgeon: Michael Macho, MD;  Location: [facility];  Service: General;  Laterality: Right;   INSERTION OF MESH N/A 12/25/2012   Procedure: INSERTION OF MESH;  Surgeon: Michael Ridges, MD;  Location: [facility] OR;  Service: General;  Laterality: N/A;   INSERTION OF MESH N/A 08/26/2016   Procedure: INSERTION OF MESH;  Surgeon: Michael Norman, MD;  Location: [facility] OR;  Service: General;  Laterality: N/A;   LAPAROTOMY  02/25/2012  Procedure: EXPLORATORY LAPAROTOMY;  Surgeon: Michael Bering, MD;  Location: [facility];  Service: General;  Laterality: N/A;   POLYPECTOMY  05/27/2016   Procedure: POLYPECTOMY;  Surgeon: Michael Ade, MD;  [facility];  Service: Endoscopy;;  Descending colon polyp and Sigmoid colon polyp removed via hot snare   POLYPECTOMY  09/27/2019   Procedure: POLYPECTOMY;  Surgeon: Michael Ade, MD;  [facility];  Service: Endoscopy;;   PROSTATE BIOPSY      robotic prostate surgery  2011   TRACHEOSTOMY  03/2012   VENTRAL HERNIA REPAIR  12/25/2012    Dr Michael Ellison   VENTRAL HERNIA REPAIR N/A 12/25/2012   Procedure: HERNIA REPAIR VENTRAL ADULT;  Surgeon: Michael Ridges, MD;  Location: [facility] OR;  Service: General;  Laterality: N/A;   VENTRAL HERNIA REPAIR N/A 08/26/2016   Procedure: VENTRAL INCISIONAL HERNIA REPAIR WITH MESH;  Surgeon: Michael Norman, MD;  Location: [facility];  Service: General;  Laterality: N/A;   WOUND DEBRIDEMENT  03/02/2012   Procedure: DEBRIDEMENT CLOSURE/ABDOMINAL WOUND;  Surgeon: Michael Ridges, MD;  Location: [facility];  Service: General;  Laterality: N/A;   ZENKER'S DIVERTICULECTOMY  2013   Dr. Jenne Ellison   Family History: Family History  Problem Relation Age of Onset   Hypertension Mother    Pseudochol deficiency Neg Hx    Anesthesia problems Neg Hx    Hypotension Neg Hx    Malignant hyperthermia Neg Hx    Colon cancer Neg Hx    Social History:  Social History   Tobacco Use   Smoking status: Former    Current packs/day: 0.00    Average packs/day: 1.5 packs/day for 44.0 years (66.0 ttl pk-yrs)    Types: Cigarettes    Start date: 02/22/1968    Quit date: 02/22/2012    Years since quitting: 11.3   Smokeless tobacco: Never   Tobacco comments:    Quit x 4 years  Substance Use Topics   Alcohol use: No    Alcohol/week: 0.0 standard drinks of alcohol    Comment: sober since ~ 02/2012   Medications: Current Outpatient Medications on File Prior to Visit  Medication Sig Dispense Refill   acetaminophen (TYLENOL) 500 MG tablet Take 500 mg by mouth every 6 (six) hours as needed for headache.      aspirin EC 81 MG tablet Take 81 mg by mouth as needed. Takes 1 to 2 a month.     furosemide (LASIX) 40 MG tablet Take 40 mg by mouth daily.     gabapentin (NEURONTIN) 300 MG capsule Take 300 mg by mouth as needed.     hydrochlorothiazide (HYDRODIURIL) 25 MG tablet Take 25 mg by mouth daily.     ibuprofen (ADVIL,MOTRIN) 200 MG tablet Take 400 mg every 6  (six) hours as needed by mouth for headache or moderate pain.     ipratropium-albuterol (DUONEB) 0.5-2.5 (3) MG/3ML SOLN Take 3 mLs by nebulization every 6 (six) hours as needed (for shortness of breath/wheezing).      lisinopril (PRINIVIL,ZESTRIL) 20 MG tablet Take 20 mg daily by mouth.     lubiprostone (AMITIZA) 24 MCG capsule Take one capsule once to twice daily for constipation. 60 capsule 3   meclizine (ANTIVERT) 25 MG tablet Take 25 mg by mouth 3 (three) times daily.     meloxicam (MOBIC) 7.5 MG tablet SMARTSIG:1 Tablet(s) By Mouth Morning-Evening     potassium chloride SA (KLOR-CON M) 20 MEQ tablet Take 20 mEq by mouth daily.  sertraline (ZOLOFT) 25 MG tablet Take 25 mg by mouth daily.     SYMBICORT 160-4.5 MCG/ACT inhaler Inhale 1 puff into the lungs 2 (two) times daily.     tamsulosin (FLOMAX) 0.4 MG CAPS capsule Take 1 capsule by mouth daily.     No current facility-administered medications on file prior to visit.   Allergies: Allergies  Allergen Reactions   Lorazepam Other (See Comments)    SEVERE DELERIUM AGITATION Tolerates midazolam and other benzo's   Physical Exam: VS:  BP (!) 148/72 (BP Location: Right Arm, Patient Position: Sitting, Cuff Size: Normal)   Pulse (!) 101   Ht 6\' 2"  (1.88 m)   Wt 201 lb 6.4 oz (91.4 kg)   SpO2 94%   BMI 25.86 kg/m , BMI Body mass index is 25.86 kg/m.  Wt Readings from Last 3 Encounters:  06/15/23 201 lb 6.4 oz (91.4 kg)  05/31/23 203 lb 3.2 oz (92.2 kg)  05/20/23 202 lb 3.2 oz (91.7 kg)    General: Patient appears comfortable at rest. HEENT: Conjunctiva and lids normal. Neck: Supple, no elevated JVP, bilateral carotid bruits left greater than right. Lungs: Decreased breath sounds without wheezing, nonlabored breathing at rest. Cardiac: Regular rate and rhythm with ectopy, no S3, 3/6 systolic murmur, no pericardial rub. Abdomen: Soft, nontender, bowel sounds present. Extremities: 2+ ankle and lower leg edema. Skin: Warm and  dry. Musculoskeletal: No kyphosis. Neuropsychiatric: Alert and oriented x3, affect grossly appropriate.  ECG:  An ECG dated 07/27/2018 was personally reviewed today and demonstrated:  Sinus rhythm with increased voltage, rule out old inferior infarct pattern, PVCs, nonspecific ST-T wave abnormalities.  Labwork: 04/26/2023: TSH 0.712 05/31/2023: ALT 20; AST 24; BUN 16; Creatinine, Ser 1.01; Hemoglobin 12.6; Platelets 254; Potassium 3.6; Sodium 134   Other Studies Reviewed Today:  Echocardiogram 04/18/2014: - Left ventricle: The cavity size was normal. Wall thickness was    normal. Systolic function was vigorous. The estimated ejection    fraction was in the range of 65% to 70%. Wall motion was normal;    there were no regional wall motion abnormalities. Doppler    parameters are consistent with abnormal left ventricular    relaxation (grade 1 diastolic dysfunction).   Assessment and Plan:  1.  Recent onset bilateral leg edema.  Started on Lasix with potassium supplement by Dr. Felecia Ellison and symptoms beginning to improve.  He has not undergone any recent cardiac structural evaluation, echocardiogram will be obtained to assess LVEF, RV function and estimated PASP with emphysema, also valvular status given heart murmur on examination.  He has no known history of prior myocardial infarction or cardiomyopathy, ECG abnormalities look to be more chronic.  2.  Intermittent palpitations, in sinus rhythm with atrial and ventricular ectopy by ECG today.  Obtain 7-day Zio patch to evaluate for potential arrhythmia.  3.  History of emphysema and presumably hypoxic respiratory failure with as needed oxygen use at home.  States that he plans to reestablish with Pulmonary in the near future.  4.  Essential hypertension, currently on lisinopril and HCTZ per Dr. Felecia Ellison.  5.  Carotid bruits, obtain carotid Dopplers for further evaluation.  Disposition:  Follow up  test results with further recommendations to  follow.  Signed, Jonelle Sidle, M.D., F.A.C.C. [facility]

## 2023-06-15 ENCOUNTER — Encounter: Payer: Self-pay | Admitting: Cardiology

## 2023-06-15 ENCOUNTER — Ambulatory Visit: Payer: Medicare Other | Attending: Cardiology | Admitting: Cardiology

## 2023-06-15 ENCOUNTER — Ambulatory Visit: Payer: Medicare Other | Attending: Cardiology

## 2023-06-15 VITALS — BP 148/72 | HR 101 | Ht 74.0 in | Wt 201.4 lb

## 2023-06-15 DIAGNOSIS — R002 Palpitations: Secondary | ICD-10-CM

## 2023-06-15 DIAGNOSIS — R011 Cardiac murmur, unspecified: Secondary | ICD-10-CM

## 2023-06-15 DIAGNOSIS — R0989 Other specified symptoms and signs involving the circulatory and respiratory systems: Secondary | ICD-10-CM

## 2023-06-15 DIAGNOSIS — R0602 Shortness of breath: Secondary | ICD-10-CM | POA: Diagnosis not present

## 2023-06-15 DIAGNOSIS — R600 Localized edema: Secondary | ICD-10-CM | POA: Diagnosis not present

## 2023-06-15 NOTE — Patient Instructions (Signed)
Medication Instructions:  Your physician recommends that you continue on your current medications as directed. Please refer to the Current Medication list given to you today.   Labwork: None today  Testing/Procedures: Your physician has requested that you have a carotid duplex. This test is an ultrasound of the carotid arteries in your neck. It looks at blood flow through these arteries that supply the brain with blood. Allow one hour for this exam. There are no restrictions or special instructions.  Your physician has requested that you have an echocardiogram. Echocardiography is a painless test that uses sound waves to create images of your heart. It provides your doctor with information about the size and shape of your heart and how well your heart's chambers and valves are working. This procedure takes approximately one hour. There are no restrictions for this procedure. Please do NOT wear cologne, perfume, aftershave, or lotions (deodorant is allowed). Please arrive 15 minutes prior to your appointment time.   ZIO XT- Long Term Monitor Instructions   Your physician has requested you wear your ZIO patch monitor_____7__days.   This is a single patch monitor.  [employer] supplies one patch monitor per enrollment.  Additional stickers are not available.   Please do not apply patch if you will be having a Nuclear Stress Test, Echocardiogram, Cardiac CT, MRI, or Chest Xray during the time frame you would be wearing the monitor. The patch cannot be worn during these tests.  You cannot remove and re-apply the ZIO XT patch monitor.   Do not shower for the first 24 hours.  You may shower after the first 24 hours.   Press button if you feel a symptom. You will hear a small click.  Record Date, Time and Symptom in the Patient Log Book.   When you are ready to remove patch, follow instructions on last 2 pages of Patient Log Book.  Stick patch monitor onto last page of Patient Log Book.   Place  Patient Log Book in [place] box.  Use locking tab on box and tape box closed securely.  The [employer] has [employer] on it.  Please place in mailbox as soon as possible.  Your physician should have your test results approximately 7 days after the monitor has been mailed back to [facility].   Call [facility] Customer Care at [phone] if you have questions regarding your ZIO XT patch monitor.  Call them immediately if you see an orange light blinking on your monitor.   If your monitor falls off in less than 4 days contact our Monitor department at [phone].  If your monitor becomes loose or falls off after 4 days call [employer] at [phone] for suggestions on securing your monitor.    Follow-Up: To be determined  Any Other Special Instructions Will Be Listed Below (If Applicable).  If you need a refill on your cardiac medications before your next appointment, please call your pharmacy.

## 2023-06-24 DIAGNOSIS — R002 Palpitations: Secondary | ICD-10-CM | POA: Diagnosis not present

## 2023-06-28 ENCOUNTER — Telehealth: Payer: Self-pay

## 2023-06-28 MED ORDER — BISOPROLOL FUMARATE 5 MG PO TABS: 2.5000 mg | ORAL_TABLET | Freq: Every day | ORAL | 3 refills | Status: DC

## 2023-06-28 NOTE — Telephone Encounter (Signed)
-----   Message from Nona Dell sent at 06/28/2023 11:42 AM EDT ----- Results reviewed.  Cardiac monitor did not show any atrial fibrillation.  He has occasional PACs and PVCs which we already noted on standard ECG.  Also multiple but brief episodes of PSVT, suspect he is feeling this when he senses palpitations.  Let's try starting him on bisoprolol 2.5 mg daily to see if this helps.

## 2023-06-28 NOTE — Telephone Encounter (Signed)
Monitor results discussed with patient.He is agreeable to start BB. His palpitations are more at night. He will start Bisoprolol 2.5 mg every evening. He would like a 30 day supply at first vs 90 day as he is not sure what his cost will be. E-scribed to [employer].   Report copied to pcp

## 2023-06-30 ENCOUNTER — Telehealth: Payer: Self-pay | Admitting: Cardiology

## 2023-06-30 NOTE — Telephone Encounter (Signed)
I spoke with patient and he gave permission to speak with daughter [name]. I spoke with Tomiko and we discussed zio results and why we started beat blocker on patient.She is going to log on to MyChart for her Dad so she can monitor his test results in the future.

## 2023-06-30 NOTE — Telephone Encounter (Signed)
Pt c/o medication issue:  1. Name of Medication: isoprolol (ZEBETA) 5 MG tablet   2. How are you currently taking this medication (dosage and times per day)? Take 0.5 tablets (2.5 mg total) by mouth daily after supper.   3. Are you having a reaction (difficulty breathing--STAT)? No  4. What is your medication issue? Patient's daughter is calling because the patient was prescribed this medication. Patient daughter was wanting know what this medication is for and would like to go over the heart monitor results. Please advise.

## 2023-06-30 NOTE — Telephone Encounter (Signed)
Patient does not list Ms.Evans in [employer]. I have asked him to call back and give me verbal permission.

## 2023-07-05 ENCOUNTER — Encounter: Payer: Self-pay | Admitting: Internal Medicine

## 2023-07-05 ENCOUNTER — Other Ambulatory Visit (HOSPITAL_COMMUNITY): Payer: Medicare Other

## 2023-07-05 ENCOUNTER — Ambulatory Visit: Payer: Medicare Other | Admitting: Internal Medicine

## 2023-07-05 VITALS — BP 120/71 | HR 69 | Temp 97.8°F | Ht 74.5 in | Wt 197.4 lb

## 2023-07-05 DIAGNOSIS — K5900 Constipation, unspecified: Secondary | ICD-10-CM

## 2023-07-05 DIAGNOSIS — K625 Hemorrhage of anus and rectum: Secondary | ICD-10-CM | POA: Diagnosis not present

## 2023-07-05 NOTE — Progress Notes (Unsigned)
Primary Care Physician:  Benetta Spar, MD Primary Gastroenterologist:  Dr. Jena Gauss  Pre-Procedure History & Physical: HPI:  Michael Ellison is a 77 y.o. male here for consideration of 1 more colonoscopy.  Colon polyps 2017, 2020 (1 small 1 in 2020.  Recent left-sided abdominal pain,  thickening of sigmoid colon on CT treat empirically for Augmentin.  Symptoms improved.  Notes a little blood per rectum when he has a bowel movement.  New over the past few months.  Takes Amitiza twice daily for constipation; states he would pass rocks if he did not take Amitiza.  History of COPD has home O2 which he only uses intermittently.  He states if he takes his time he can walk several blocks.  He has follow-up appointment with pulmonary in cardiology coming up.  He is not anticoagulated.  He does take ibuprofen regularly.  He does not does not have any upper GI tract tract symptoms of GERD dysphagia nausea vomiting.  He has had no melena.  Past Medical History:  Diagnosis Date   Arthritis    Aspiration pneumonia (HCC) 03/01/2012   Cancer of prostate (HCC)    COPD (chronic obstructive pulmonary disease) (HCC)    Depression    GERD (gastroesophageal reflux disease)    Hernia of abdominal cavity    Hypertension    On home O2    3L prn    Past Surgical History:  Procedure Laterality Date   BOWEL RESECTION  02/25/2012   Procedure: SMALL BOWEL RESECTION;  Surgeon: [name], MD;  Location: [facility];  Service: General;;   COLONOSCOPY N/A 05/27/2016   Dr.Nathin Saran: 14 mm polyp in the sigmoid colon removed, tubular adenoma.  8 mm polyp in the descending colon removed, tubular adenoma.  Diverticulosis.  Recommend colonoscopy 3 years   COLONOSCOPY WITH PROPOFOL N/A 09/27/2019   Procedure: COLONOSCOPY WITH PROPOFOL;  Surgeon: Corbin Ade, MD;  Location: [facility];  Service: Endoscopy;  Laterality: N/A;  12:30pm   HERNIA REPAIR     INCISION AND DRAINAGE ABSCESS N/A 01/15/2013    Procedure: INCISION AND DRAINAGE Abdominal Wall ABSCESS;  Surgeon: Cherylynn Ridges, MD;  Location: [facility];  Service: General;  Laterality: N/A;   INGUINAL HERNIA REPAIR Right 09/16/2017   Procedure: RIGHT INGUINAL HERNIORRHAPHY  WITH MESH;  Surgeon: Franky Macho, MD;  Location: [facility];  Service: General;  Laterality: Right;   INSERTION OF MESH N/A 12/25/2012   Procedure: INSERTION OF MESH;  Surgeon: Cherylynn Ridges, MD;  Location: [facility];  Service: General;  Laterality: N/A;   INSERTION OF MESH N/A 08/26/2016   Procedure: INSERTION OF MESH;  Surgeon: Jimmye Norman, MD;  Location: [facility];  Service: General;  Laterality: N/A;   LAPAROTOMY  02/25/2012   Procedure: EXPLORATORY LAPAROTOMY;  Surgeon: [name], MD;  Location: [facility];  Service: General;  Laterality: N/A;   POLYPECTOMY  05/27/2016   Procedure: POLYPECTOMY;  Surgeon: Corbin Ade, MD;  Location: [facility];  Service: Endoscopy;;  Descending colon polyp and Sigmoid colon polyp removed via hot snare   POLYPECTOMY  09/27/2019   Procedure: POLYPECTOMY;  Surgeon: Corbin Ade, MD;  Location: [facility];  Service: Endoscopy;;   PROSTATE BIOPSY     robotic prostate surgery  2011   TRACHEOSTOMY  03/2012   VENTRAL HERNIA REPAIR  12/25/2012    Dr Lindie Spruce   VENTRAL HERNIA REPAIR N/A 12/25/2012   Procedure: HERNIA REPAIR VENTRAL ADULT;  Surgeon: Cherylynn Ridges, MD;  Location: [facility] OR;  Service: General;  Laterality: N/A;   VENTRAL HERNIA REPAIR N/A 08/26/2016   Procedure: VENTRAL INCISIONAL HERNIA REPAIR WITH MESH;  Surgeon: Jimmye Norman, MD;  Location: [facility] OR;  Service: General;  Laterality: N/A;   WOUND DEBRIDEMENT  03/02/2012   Procedure: DEBRIDEMENT CLOSURE/ABDOMINAL WOUND;  Surgeon: Cherylynn Ridges, MD;  Location[facility] OR;  Service: General;  Laterality: N/A;   ZENKER'S DIVERTICULECTOMY  2013   Dr. Jenne Pane    Prior to Admission medications   Medication Sig Start Date End Date Taking? Authorizing Provider  acetaminophen (TYLENOL) 500 MG tablet Take  500 mg by mouth every 6 (six) hours as needed for headache.    Yes [provider]  aspirin EC 81 MG tablet Take 81 mg by mouth as needed. Takes 1 to 2 a month.   Yes [provider]  bisoprolol (ZEBETA) 5 MG tablet Take 0.5 tablets (2.5 mg total) by mouth daily after supper. 06/28/23  Yes Jonelle Sidle, MD  furosemide (LASIX) 40 MG tablet Take 40 mg by mouth daily. 06/08/23  Yes [provider]  gabapentin (NEURONTIN) 300 MG capsule Take 300 mg by mouth as needed.   Yes [provider]  hydrochlorothiazide (HYDRODIURIL) 25 MG tablet Take 25 mg by mouth daily. 04/04/23  Yes [provider]  ibuprofen (ADVIL,MOTRIN) 200 MG tablet Take 400 mg every 6 (six) hours as needed by mouth for headache or moderate pain.   Yes [provider]  ipratropium-albuterol (DUONEB) 0.5-2.5 (3) MG/3ML SOLN Take 3 mLs by nebulization every 6 (six) hours as needed (for shortness of breath/wheezing).    Yes [provider]  lisinopril (PRINIVIL,ZESTRIL) 20 MG tablet Take 20 mg daily by mouth.   Yes [provider]  lubiprostone (AMITIZA) 24 MCG capsule Take one capsule once to twice daily for constipation. 04/29/23  Yes Tiffany Kocher, PA-C  meclizine (ANTIVERT) 25 MG tablet Take 25 mg by mouth 3 (three) times daily. 05/04/23  Yes [provider]  meloxicam (MOBIC) 7.5 MG tablet SMARTSIG:1 Tablet(s) By Mouth Morning-Evening 04/04/23  Yes [provider]  potassium chloride SA (KLOR-CON M) 20 MEQ tablet Take 20 mEq by mouth daily. 06/08/23  Yes [provider]  sertraline (ZOLOFT) 25 MG tablet Take 25 mg by mouth daily. 04/04/23  Yes [provider]  SYMBICORT 160-4.5 MCG/ACT inhaler Inhale 1 puff into the lungs 2 (two) times daily. 04/04/23  Yes [provider]  tamsulosin (FLOMAX) 0.4 MG CAPS capsule Take 1 capsule by mouth daily. 07/05/18  Yes [provider]    Allergies as of 07/05/2023 - Review  Complete 07/05/2023  Allergen Reaction Noted   Lorazepam Other (See Comments) 02/27/2012    Family History  Problem Relation Age of Onset   Hypertension Mother    Pseudochol deficiency Neg Hx    Anesthesia problems Neg Hx    Hypotension Neg Hx    Malignant hyperthermia Neg Hx    Colon cancer Neg Hx     Social History   Socioeconomic History   Marital status: Divorced    Spouse name: Not on file   Number of children: Not on file   Years of education: Not on file   Highest education level: Not on file  Occupational History   Occupation: retired    Comment: [employer]  Tobacco Use   Smoking status: Former    Current packs/day: 0.00    Average packs/day: 1.5 packs/day for  44.0 years (66.0 ttl pk-yrs)    Types: Cigarettes    Start date: 02/22/1968    Quit date: 02/22/2012    Years since quitting: 11.3   Smokeless tobacco: Never   Tobacco comments:    Quit x 4 years  Vaping Use   Vaping status: Never Used  Substance and Sexual Activity   Alcohol use: No    Alcohol/week: 0.0 standard drinks of alcohol    Comment: sober since ~ 02/2012   Drug use: No   Sexual activity: Not Currently  Other Topics Concern   Not on file  Social History Narrative   Not on file   Social Determinants of Health   Financial Resource Strain: Not on file  Food Insecurity: Not on file  Transportation Needs: Not on file  Physical Activity: Not on file  Stress: Not on file  Social Connections: Not on file  Intimate Partner Violence: Not on file    Review of Systems: See HPI, otherwise negative ROS  Physical Exam: BP 120/71 (BP Location: Right Arm, Patient Position: Sitting, Cuff Size: Normal)   Pulse 69   Temp 97.8 F (36.6 C) (Oral)   Ht 6' 2.5" (1.892 m)   Wt 197 lb 6.4 oz (89.5 kg)   SpO2 96%   BMI 25.01 kg/m  General:   Alert,  Well-developed, well-nourished, pleasant and cooperative in NAD Neck:  Supple; no masses or thyromegaly. No significant cervical adenopathy. Lungs:  Clear  throughout to auscultation.   No wheezes, crackles, or rhonchi. No acute distress. Heart:  Regular rate and rhythm; no murmurs, clicks, rubs,  or gallops. Abdomen: Nondistended.  Positive bowel sounds soft and nontender.  Impression/Plan: 77 year old [place] [employer] veteran with recent left sided abdominal pain and sigmoid thickening on CT treated with antibiotics for diverticulitis.  Symptoms have improved.  He does note some rectal bleeding on occasion with having a bowel movement.  No longer constipated on Amitiza.  History of colonic adenomas.  At the believe in this setting a colonoscopy is now warranted.  Cardiopulmonary issues are not prohibitive by my assessment.  Recommendations: I have offered the patient a diagnostic colonoscopy in the near future.  ASA 3.  The risks, benefits, limitations, alternatives and imponderables have been reviewed with the patient. Questions have been answered. All parties are agreeable.    Further recommendations to follow.     Notice: This dictation was prepared with Dragon dictation along with smaller phrase technology. Any transcriptional errors that result from this process are unintentional and may not be corrected upon review.

## 2023-07-05 NOTE — Patient Instructions (Signed)
It was good to see you again today!  As discussed, we will go ahead and schedule a diagnostic colonoscopy (abnormal sigmoid colon on CT) and rectal bleeding.  ASA 3  Further recommendations to follow after the procedures been performed.

## 2023-07-06 ENCOUNTER — Telehealth: Payer: Self-pay | Admitting: *Deleted

## 2023-07-06 NOTE — Telephone Encounter (Signed)
LMTRC  TCS ASA 3 w/Dr.rourk

## 2023-07-07 ENCOUNTER — Other Ambulatory Visit: Payer: Self-pay | Admitting: *Deleted

## 2023-07-07 ENCOUNTER — Encounter: Payer: Self-pay | Admitting: *Deleted

## 2023-07-07 MED ORDER — PEG 3350-KCL-NA BICARB-NACL 420 G PO SOLR: 4000.0000 mL | Freq: Once | ORAL | 0 refills | Status: AC

## 2023-07-07 NOTE — Telephone Encounter (Signed)
Pt left vm returning call to schedule procedure.  Pt has been scheduled for 08/17/23. Instructions mailed and prep sent to the pharmacy  [facility]: Notification or Prior Authorization is not required for the requested services You are not required to submit a notification/prior authorization based on the information provided. If you have general questions about the prior authorization requirements, visit [email or web address] > Clinician Resources > Advance and Admission Notification Requirements. The number above acknowledges your notification. Please write this reference number down for future reference. If you would like to request an organization determination, please call us at [phone]. Decision ID #: [id number]

## 2023-07-09 DIAGNOSIS — J449 Chronic obstructive pulmonary disease, unspecified: Secondary | ICD-10-CM | POA: Diagnosis not present

## 2023-07-09 DIAGNOSIS — I10 Essential (primary) hypertension: Secondary | ICD-10-CM | POA: Diagnosis not present

## 2023-07-11 NOTE — Progress Notes (Unsigned)
LYELL TOTINO, male    DOB: 11/21/1945    MRN: [id number]   Brief patient profile:  33  yobm  quit smoking 2013 p prolonged admit with abd pain > adhesions > dehiscence>   trach self referred to pulmonary clinic in [place]  07/12/2023 for doe not proportional to activity     Spirometry  06/22/16  FEV1 2.04 (55%)  Ratio 0.62 with min curvature     History of Present Illness  07/12/2023  Pulmonary/ 1st office eval/ Dalaina Tates /  Office  Chief Complaint  Patient presents with   COPD    Consult for COPD   Dyspnea:  walks [employer] ok but sometimes  can't get to the car in driveway  Cough: dry cough  more day more hoarse  Sleep: level bed  2 pillow with gasping for breath and sits on side of bed SABA use: does not have one  02: prn but not checking sats    No obvious day to day or daytime pattern/variability or assoc excess/ purulent sputum or mucus plugs or hemoptysis or cp or chest tightness, subjective wheeze or overt sinus or hb symptoms.    Also denies any obvious fluctuation of symptoms with weather or environmental changes or other aggravating or alleviating factors except as outlined above   No unusual exposure hx or h/o childhood pna/ asthma or knowledge of premature birth.  Current Allergies, Complete Past Medical History, Past Surgical History, Family History, and Social History were reviewed in [employer] record.  ROS  The following are not active complaints unless bolded Hoarseness, sore throat, dysphagia, dental problems, itching, sneezing,  nasal congestion or discharge of excess mucus or purulent secretions, ear ache,   fever, chills, sweats, unintended wt loss or wt gain, classically pleuritic or exertional cp,  orthopnea pnd or arm/hand swelling  or leg swelling, presyncope, palpitations, abdominal pain, anorexia, nausea, vomiting, diarrhea  or change in bowel habits or change in bladder habits, change in stools or change in urine, dysuria,  hematuria,  rash, arthralgias, visual complaints, headache, numbness, weakness or ataxia or problems with walking or coordination,  change in mood or  memory.              Outpatient Medications Prior to Visit  Medication Sig Dispense Refill   acetaminophen (TYLENOL) 500 MG tablet Take 500 mg by mouth every 6 (six) hours as needed for headache.      aspirin EC 81 MG tablet Take 81 mg by mouth as needed. Takes 1 to 2 a month.     bisoprolol (ZEBETA) 5 MG tablet Take 0.5 tablets (2.5 mg total) by mouth daily after supper. 45 tablet 3   furosemide (LASIX) 40 MG tablet Take 40 mg by mouth daily.     gabapentin (NEURONTIN) 300 MG capsule Take 300 mg by mouth as needed.     hydrochlorothiazide (HYDRODIURIL) 25 MG tablet Take 25 mg by mouth daily.     ibuprofen (ADVIL,MOTRIN) 200 MG tablet Take 400 mg every 6 (six) hours as needed by mouth for headache or moderate pain.     ipratropium-albuterol (DUONEB) 0.5-2.5 (3) MG/3ML SOLN Take 3 mLs by nebulization every 6 (six) hours as needed (for shortness of breath/wheezing).      lisinopril (PRINIVIL,ZESTRIL) 20 MG tablet Take 20 mg daily by mouth.     lubiprostone (AMITIZA) 24 MCG capsule Take one capsule once to twice daily for constipation. 60 capsule 3   meclizine (ANTIVERT) 25 MG tablet Take  25 mg by mouth 3 (three) times daily.     meloxicam (MOBIC) 7.5 MG tablet SMARTSIG:1 Tablet(s) By Mouth Morning-Evening     potassium chloride SA (KLOR-CON M) 20 MEQ tablet Take 20 mEq by mouth daily.     sertraline (ZOLOFT) 25 MG tablet Take 25 mg by mouth daily.     SYMBICORT 160-4.5 MCG/ACT inhaler Inhale 1 puff into the lungs 2 (two) times daily.     tamsulosin (FLOMAX) 0.4 MG CAPS capsule Take 1 capsule by mouth daily.     No facility-administered medications prior to visit.    Past Medical History:  Diagnosis Date   Arthritis    Aspiration pneumonia (HCC) 03/01/2012   Cancer of prostate (HCC)    COPD (chronic obstructive pulmonary disease) (HCC)     Depression    GERD (gastroesophageal reflux disease)    Hernia of abdominal cavity    Hypertension    On home O2    3L prn      Objective:     BP 130/76   Pulse 79   Ht 6' 2.5" (1.892 m)   Wt 195 lb (88.5 kg)   SpO2 95% Comment: RA  BMI 24.70 kg/m   Vital signs reviewed  07/12/2023  - Note at rest 02 sats  95% on RA   General appearance:    amb hoarse pleasant bm nad   HEENT : Oropharynx  clear  Nasal turbinates nl    NECK :  without  apparent JVD/ palpable Nodes/TM    LUNGS: no acc muscle use,  Min barrel  contour chest wall with bilateral  slightly decreased bs s audible wheeze and  without cough on insp or exp maneuvers and min  Hyperresonant  to  percussion bilaterally    CV:  RRR  no s3  with 3/6 SEM s increase in P2, and no edema   ABD: obese  soft and nontender with pos end  insp Hoover's  in the supine position.  No bruits or organomegaly appreciated   MS:  Nl gait/ ext warm without deformities Or obvious joint restrictions  calf tenderness, cyanosis or clubbing     SKIN: warm and dry without lesions    NEURO:  alert, approp, nl sensorium with  no motor or cerebellar deficits apparent.           I personally reviewed images and agree with radiology impression as follows:  CXR:   pa and lateral 06/09/23  Mild cm/ mild coarse stable increased markings diffusely       Assessment   COPD GOLD 2 Quit smokng 2013 with admit for abd pain> surgery > dehisence > trach  -  Spirometry  06/22/16  FEV1 2.04 (55%)  Ratio 0.62 with min curvature   - 07/12/2023  After extensive coaching inhaler device,  effectiveness =    50% from a baseline of nearly 0 > continue symbicort 160/ d/c ACEi    DDX of  difficult airways management almost all start with A and  include Adherence, Ace Inhibitors, Acid Reflux, Active Sinus Disease, Alpha 1 Antitripsin deficiency, Anxiety masquerading as Airways dz,  ABPA,  Allergy(esp in young), Aspiration (esp in elderly), Adverse effects  of meds,  Active smoking or vaping, A bunch of PE's (a small clot burden can't cause this syndrome unless there is already severe underlying pulm or vascular dz with poor reserve) plus two Bs  = Bronchiectasis and Beta blocker use..and one C= CHF  Adherence is always the initial "  prime suspect" and is a multilayered concern that requires a "trust but verify" approach in every patient - starting with knowing how to use medications, especially inhalers, correctly, keeping up with refills and understanding the fundamental difference between maintenance and prns vs those medications only taken for a very short course and then stopped and not refilled.  - see hfa teaching - very poor insight into meds / needs daughter's help  ACEi adverse effects at the  top of the usual list of suspects and the only way to rule it out is a trial off > see a/p    ? Allergy /asthma > minimal component, symb 160 should be adequate if he can learn it  ? Chf >  murmur c/w AS > echo pending   F/u in 4-6 weeks          Each maintenance medication was reviewed in detail including emphasizing most importantly the difference between maintenance and prns and under what circumstances the prns are to be triggered using an action plan format where appropriate.  Total time for H and P, chart review, counseling, reviewing hfa device(s) and generating customized AVS unique to this office visit / same day charting = 60 min new pt eval         Sandrea Hughs, MD 07/11/2023

## 2023-07-12 ENCOUNTER — Encounter: Payer: Self-pay | Admitting: Internal Medicine

## 2023-07-12 ENCOUNTER — Ambulatory Visit: Payer: Medicare Other | Admitting: Internal Medicine

## 2023-07-12 VITALS — BP 130/76 | HR 79 | Ht 74.5 in | Wt 195.0 lb

## 2023-07-12 DIAGNOSIS — I10 Essential (primary) hypertension: Secondary | ICD-10-CM | POA: Diagnosis not present

## 2023-07-12 DIAGNOSIS — J449 Chronic obstructive pulmonary disease, unspecified: Secondary | ICD-10-CM | POA: Diagnosis not present

## 2023-07-12 MED ORDER — OLMESARTAN MEDOXOMIL 20 MG PO TABS: ORAL_TABLET | ORAL | 11 refills | Status: DC

## 2023-07-12 NOTE — Assessment & Plan Note (Signed)
D/c acei 07/12/2023 due to unexplained and variable sob   In the best review of chronic cough to date ( NEJM 2016 375 [id number]) ,  ACEi are now felt to cause cough in up to  20% of pts which is a 4 fold increase from previous reports and does not include the variety of non-specific complaints we see in pulmonary clinic in pts on ACEi but previously attributed to another dx like  Copd/asthma and  include PNDS, throat and chest congestion, "bronchitis", unexplained dyspnea and noct "strangling" sensations, and hoarseness, but also  atypical /refractory GERD symptoms like dysphagia and "bad heartburn"   The only way I know  to prove this is not an "ACEi Case" is a trial off ACEi x a minimum of 6 weeks then regroup.    >>> try benicar 20 mg daily and f/u in 4 weeks          Each maintenance medication was reviewed in detail including emphasizing most importantly the difference between maintenance and prns and under what circumstances the prns are to be triggered using an action plan format where appropriate.  Total time for H and P, chart review, counseling, reviewing hfa device(s) and generating customized AVS unique to this office visit / same day charting  > 60 min new pt eval

## 2023-07-12 NOTE — Assessment & Plan Note (Addendum)
Quit smokng 2013 with admit for abd pain> surgery > dehisence > trach  -  Spirometry  06/22/16  FEV1 2.04 (55%)  Ratio 0.62 with min curvature   - 07/12/2023  After extensive coaching inhaler device,  effectiveness =    50% from a baseline of nearly 0 > continue symbicort 160/ d/c ACEi    DDX of  difficult airways management almost all start with A and  include Adherence, Ace Inhibitors, Acid Reflux, Active Sinus Disease, Alpha 1 Antitripsin deficiency, Anxiety masquerading as Airways dz,  ABPA,  Allergy(esp in young), Aspiration (esp in elderly), Adverse effects of meds,  Active smoking or vaping, A bunch of PE's (a small clot burden can't cause this syndrome unless there is already severe underlying pulm or vascular dz with poor reserve) plus two Bs  = Bronchiectasis and Beta blocker use..and one C= CHF  Adherence is always the initial "prime suspect" and is a multilayered concern that requires a "trust but verify" approach in every patient - starting with knowing how to use medications, especially inhalers, correctly, keeping up with refills and understanding the fundamental difference between maintenance and prns vs those medications only taken for a very short course and then stopped and not refilled.  - see hfa teaching - very poor insight into meds / needs daughter's help  ACEi adverse effects at the  top of the usual list of suspects and the only way to rule it out is a trial off > see a/p    ? Allergy /asthma > minimal component, symb 160 should be adequate if he can learn it  ? Chf >  murmur c/w AS > echo pending

## 2023-07-12 NOTE — Patient Instructions (Addendum)
Stop lisinopril and start olmesartan  20 mg one daily in its place   Work on inhaler technique:  relax and gently blow all the way out then take a nice smooth full deep breath back in, triggering the inhaler at same time you start breathing in.  Hold breath in for at least  5 seconds if you can. Blow out symbicort out  thru nose. Rinse and gargle with water when done.  If mouth or throat bother you at all,  try brushing teeth/gums/tongue with arm and hammer toothpaste/ make a slurry and gargle and spit out.   >>>  Remember how golfers warm up by taking practice swings - do this with an empty inhaler    Symbicort 160 up to 2 puff every  12 hours - take at least 15 minutes before planned activity   Please schedule a follow up office visit in 4-6 weeks, sooner if needed  with all medications /inhalers/ solutions in hand so we can verify exactly what you are taking. This includes all medications from all doctors and over the counters.

## 2023-07-13 ENCOUNTER — Ambulatory Visit (HOSPITAL_COMMUNITY)
Admission: RE | Admit: 2023-07-13 | Discharge: 2023-07-13 | Disposition: A | Discharge status: Home / Self Care | Payer: Medicare Other | Source: Ambulatory Visit | Attending: Cardiology | Admitting: Cardiology

## 2023-07-13 DIAGNOSIS — R0602 Shortness of breath: Secondary | ICD-10-CM | POA: Diagnosis not present

## 2023-07-13 DIAGNOSIS — R0989 Other specified symptoms and signs involving the circulatory and respiratory systems: Secondary | ICD-10-CM | POA: Insufficient documentation

## 2023-07-13 LAB — ECHOCARDIOGRAM COMPLETE
AR max vel: 3.39 cm2
AV Area VTI: 3.86 cm2
AV Area mean vel: 4.02 cm2
AV Mean grad: 2.2 mmHg
AV Peak grad: 6 mmHg
Ao pk vel: 1.22 m/s
Area-P 1/2: 2.8 cm2
MV M vel: 4.74 m/s
MV Peak grad: 89.9 mmHg
S' Lateral: 2.8 cm

## 2023-07-13 NOTE — Progress Notes (Signed)
*  PRELIMINARY RESULTS* Echocardiogram 2D Echocardiogram has been performed.  Stacey Drain 07/13/2023, 9:12 AM

## 2023-08-08 DIAGNOSIS — I10 Essential (primary) hypertension: Secondary | ICD-10-CM | POA: Diagnosis not present

## 2023-08-08 DIAGNOSIS — J449 Chronic obstructive pulmonary disease, unspecified: Secondary | ICD-10-CM | POA: Diagnosis not present

## 2023-08-08 NOTE — Progress Notes (Unsigned)
Michael Ellison, male    DOB: 1946/09/19    MRN: [id number]  Brief patient profile:  37  yobm  quit smoking 2013 p prolonged admit with abd pain > adhesions > dehiscence>   trach self referred to pulmonary clinic in [place]  07/12/2023 for doe not proportional to activity     Spirometry  06/22/16  FEV1 2.04 (55%)  Ratio 0.62 with min curvature     History of Present Illness  07/12/2023  Pulmonary/ 1st office eval/ Michael Ellison / [place] [facility]  Chief Complaint  Patient presents with   COPD    Consult for COPD   Dyspnea:  walks [employer] ok but sometimes  can't get to the car in driveway  Cough: dry cough  more day more hoarse  Sleep: level bed  2 pillow with gasping for breath and sits on side of bed SABA use: does not have one  02: prn but not checking sats  Rec Stop lisinopril and start olmesartan  20 mg one daily in its place  Work on inhaler technique:  >>>  Remember how golfers warm up by taking practice swings - do this with an empty inhaler  Symbicort 160 up to 2 puff every  12 hours - take at least 15 minutes before planned activity  Please schedule a follow up office visit in 4-6 weeks, sooner if needed  with all medications /inhalers/ solutions in hand    08/09/2023  f/u ov/[place] office/Michael Ellison re: GOLD 2 copd using 02 prn/ Mitral regurg with severe LAE   maint on symbicort 160 did not  bring meds  Chief Complaint  Patient presents with   COPD    Gold 2  Dyspnea:  slower than others anywhere [facility] = can't walk a nl pace on a flat grade s sob but does fine slow and flat  Cough: none  Sleeping: flat bed 2 pillows s    resp cc  SABA use: none  02: none    No obvious day to day or daytime variability or assoc excess/ purulent sputum or mucus plugs or hemoptysis or cp or chest tightness, subjective wheeze or overt sinus or hb symptoms.    Also denies any obvious fluctuation of symptoms with weather or environmental changes or other aggravating or alleviating factors except  as outlined above   No unusual exposure hx or h/o childhood pna/ asthma or knowledge of premature birth.  Current Allergies, Complete Past Medical History, Past Surgical History, Family History, and Social History were reviewed in [employer] record.  ROS  The following are not active complaints unless bolded Hoarseness, sore throat, dysphagia, dental problems, itching, sneezing,  nasal congestion or discharge of excess mucus or purulent secretions, ear ache,   fever, chills, sweats, unintended wt loss or wt gain, classically pleuritic or exertional cp,  orthopnea pnd or arm/hand swelling  or leg swelling, presyncope, palpitations, abdominal pain, anorexia, nausea, vomiting, diarrhea  or change in bowel habits or change in bladder habits, change in stools or change in urine, dysuria, hematuria,  rash, arthralgias, visual complaints, headache, numbness, weakness or ataxia or problems with walking or coordination,  change in mood or  memory.        Current Meds  Medication Sig   acetaminophen (TYLENOL) 500 MG tablet Take 500 mg by mouth every 6 (six) hours as needed for headache.    aspirin EC 81 MG tablet Take 81 mg by mouth as needed. Takes 1 to 2 a month.  bisoprolol (ZEBETA) 5 MG tablet Take 0.5 tablets (2.5 mg total) by mouth daily after supper.   furosemide (LASIX) 40 MG tablet Take 40 mg by mouth daily.   gabapentin (NEURONTIN) 300 MG capsule Take 300 mg by mouth as needed.   GAVILYTE-G 236 g solution SMARTSIG:Milliliter(s) By Mouth   hydrochlorothiazide (HYDRODIURIL) 25 MG tablet Take 25 mg by mouth daily.   ibuprofen (ADVIL,MOTRIN) 200 MG tablet Take 400 mg every 6 (six) hours as needed by mouth for headache or moderate pain.   ipratropium-albuterol (DUONEB) 0.5-2.5 (3) MG/3ML SOLN Take 3 mLs by nebulization every 6 (six) hours as needed (for shortness of breath/wheezing).    lubiprostone (AMITIZA) 24 MCG capsule Take one capsule once to twice daily for  constipation.   meclizine (ANTIVERT) 25 MG tablet Take 25 mg by mouth 3 (three) times daily.   meloxicam (MOBIC) 7.5 MG tablet SMARTSIG:1 Tablet(s) By Mouth Morning-Evening   olmesartan (BENICAR) 20 MG tablet One daily   potassium chloride SA (KLOR-CON M) 20 MEQ tablet Take 20 mEq by mouth daily.   sertraline (ZOLOFT) 25 MG tablet Take 25 mg by mouth daily.   SYMBICORT 160-4.5 MCG/ACT inhaler Inhale 1 puff into the lungs 2 (two) times daily.   tamsulosin (FLOMAX) 0.4 MG CAPS capsule Take 1 capsule by mouth daily.           Past Medical History:  Diagnosis Date   Arthritis    Aspiration pneumonia (HCC) 03/01/2012   Cancer of prostate (HCC)    COPD (chronic obstructive pulmonary disease) (HCC)    Depression    GERD (gastroesophageal reflux disease)    Hernia of abdominal cavity    Hypertension    On home O2    3L prn      Objective:     Wt Readings from Last 3 Encounters:  08/09/23 202 lb (91.6 kg)  07/12/23 195 lb (88.5 kg)  07/05/23 197 lb 6.4 oz (89.5 kg)     Vital signs reviewed  08/09/2023  - Note at rest 02 sats  96% on RA   General appearance:    amb bm nad/ slt pseudowheeze    HEENT : Oropharynx  clear      NECK :  without  apparent JVD/ palpable Nodes/TM / trach scar in place    LUNGS: no acc muscle use,  Min barrel  contour chest wall with bilateral  slightly decreased bs and distant transmitted insp "wheeze"  and  without cough on insp or exp maneuvers and min  Hyperresonant  to  percussion bilaterally    CV:  RRR  no s3  2-3/6 HSM s  increase in P2, and no edema   ABD:  soft and nontender with pos end  insp Hoover's  in the supine position.  No bruits or organomegaly appreciated   MS:  Nl gait/ ext warm without deformities Or obvious joint restrictions  calf tenderness, cyanosis or clubbing     SKIN: warm and dry without lesions    NEURO:  alert, approp, nl sensorium with  no motor or cerebellar deficits apparent.           I personally reviewed  images and agree with radiology impression as follows:   Chest CT w/o contrast   04/30/23 1. Pulmonary emphysema without acute findings. 2. Dilated central pulmonary arteries suggesting pulmonary hypertension. 3.  Aortic Atherosclerosis (ICD10-I70.0)  Assessment

## 2023-08-09 ENCOUNTER — Ambulatory Visit: Payer: Medicare Other | Admitting: Internal Medicine

## 2023-08-09 ENCOUNTER — Encounter: Payer: Self-pay | Admitting: Internal Medicine

## 2023-08-09 VITALS — BP 134/80 | HR 75 | Ht 74.5 in | Wt 202.0 lb

## 2023-08-09 DIAGNOSIS — I10 Essential (primary) hypertension: Secondary | ICD-10-CM

## 2023-08-09 DIAGNOSIS — J449 Chronic obstructive pulmonary disease, unspecified: Secondary | ICD-10-CM | POA: Diagnosis not present

## 2023-08-09 NOTE — Assessment & Plan Note (Signed)
Quit smokng 2013 with admit for abd pain> surgery > dehisence > trach  -  Spirometry  06/22/16  FEV1 2.04 (55%)  Ratio 0.62 with min curvature   -  Chest CT w/o contrast   04/30/23: Pulmonary emphysema without acute findings. - 07/12/2023  After extensive coaching inhaler device,  effectiveness =    50% from a baseline of nearly 0 > continue symbicort 160/ d/c ACEi> improved 08/09/2023  - Echo 07/12/33 with Mild MR but severe LAE, nl RA /RV  Clearly better but concerned about the MR mimicking copd and the pseudowheeze better off acei but clearly present right over the trach scar so at the very least needs PFTs and if any evidence of extrathoracic obst would need ENT/ possible laser for tracheal stenosis

## 2023-08-09 NOTE — Assessment & Plan Note (Signed)
D/c acei 07/12/2023 due to unexplained and variable sob >improved 08/09/2023   Although even in retrospect it may not be clear the ACEi contributed to the pt's symptoms,  Pt improved off them and adding them back at this point or in the future would risk confusion in interpretation of non-specific respiratory symptoms to which this patient is prone  ie  Better not to muddy the waters here.   Will need cards f/u on benicar 20 mg to max afterload reduction in view of MR with severe LAE / advised   F/u in pulmonary clinic in 3 m   with all meds in hand using a trust but verify approach to confirm accurate Medication  Reconciliation The principal here is that until we are certain that the  patients are doing what we've asked, it makes no sense to ask them to do more.   Pfts scheduled           Each maintenance medication was reviewed in detail including emphasizing most importantly the difference between maintenance and prns and under what circumstances the prns are to be triggered using an action plan format where appropriate.  Total time for H and P, chart review, counseling, reviewing hfa  device(s) and generating customized AVS unique to this office visit / same day charting = 34 min

## 2023-08-09 NOTE — Patient Instructions (Signed)
No change in medications   My office will be contacting you by phone for referral to PFTs  - if you don't hear back from my office within one week please call us back or notify us thru MyChart and we'll address it right away.   Please schedule a follow up visit in 6  months but call sooner if needed please bring inhalers

## 2023-08-12 ENCOUNTER — Ambulatory Visit: Payer: Medicare Other | Admitting: Cardiology

## 2023-08-15 ENCOUNTER — Encounter (HOSPITAL_COMMUNITY)
Admission: RE | Admit: 2023-08-15 | Discharge: 2023-08-15 | Disposition: A | Discharge status: Home / Self Care | Payer: Medicare Other | Source: Ambulatory Visit | Attending: Internal Medicine | Admitting: Internal Medicine

## 2023-08-15 ENCOUNTER — Encounter (HOSPITAL_COMMUNITY): Payer: Self-pay | Admitting: Internal Medicine

## 2023-08-17 ENCOUNTER — Encounter (HOSPITAL_COMMUNITY)
Admission: RE | Disposition: A | Discharge status: Home / Self Care | Payer: Self-pay | Source: Home / Self Care | Attending: Internal Medicine

## 2023-08-17 ENCOUNTER — Encounter (HOSPITAL_COMMUNITY): Payer: Self-pay | Admitting: Internal Medicine

## 2023-08-17 ENCOUNTER — Ambulatory Visit (HOSPITAL_COMMUNITY): Payer: Medicare Other | Admitting: Anesthesiology

## 2023-08-17 ENCOUNTER — Ambulatory Visit (HOSPITAL_COMMUNITY)
Admission: RE | Admit: 2023-08-17 | Discharge: 2023-08-17 | Disposition: A | Discharge status: Home / Self Care | Payer: Medicare Other | Attending: Internal Medicine | Admitting: Internal Medicine

## 2023-08-17 DIAGNOSIS — Z860101 Personal history of adenomatous and serrated colon polyps: Secondary | ICD-10-CM | POA: Diagnosis not present

## 2023-08-17 DIAGNOSIS — J449 Chronic obstructive pulmonary disease, unspecified: Secondary | ICD-10-CM | POA: Diagnosis not present

## 2023-08-17 DIAGNOSIS — K5730 Diverticulosis of large intestine without perforation or abscess without bleeding: Secondary | ICD-10-CM | POA: Diagnosis not present

## 2023-08-17 DIAGNOSIS — K219 Gastro-esophageal reflux disease without esophagitis: Secondary | ICD-10-CM | POA: Insufficient documentation

## 2023-08-17 DIAGNOSIS — K529 Noninfective gastroenteritis and colitis, unspecified: Secondary | ICD-10-CM | POA: Diagnosis not present

## 2023-08-17 DIAGNOSIS — K921 Melena: Secondary | ICD-10-CM | POA: Insufficient documentation

## 2023-08-17 DIAGNOSIS — F32A Depression, unspecified: Secondary | ICD-10-CM | POA: Diagnosis not present

## 2023-08-17 DIAGNOSIS — I10 Essential (primary) hypertension: Secondary | ICD-10-CM | POA: Diagnosis not present

## 2023-08-17 DIAGNOSIS — K625 Hemorrhage of anus and rectum: Secondary | ICD-10-CM

## 2023-08-17 DIAGNOSIS — Z87891 Personal history of nicotine dependence: Secondary | ICD-10-CM | POA: Diagnosis not present

## 2023-08-17 DIAGNOSIS — R933 Abnormal findings on diagnostic imaging of other parts of digestive tract: Secondary | ICD-10-CM | POA: Diagnosis not present

## 2023-08-17 HISTORY — DX: Dyspnea, unspecified: R06.00

## 2023-08-17 HISTORY — PX: BIOPSY: SHX5522

## 2023-08-17 HISTORY — DX: Cardiac murmur, unspecified: R01.1

## 2023-08-17 HISTORY — DX: Endocarditis, valve unspecified: I38

## 2023-08-17 HISTORY — PX: COLONOSCOPY WITH PROPOFOL: SHX5780

## 2023-08-17 SURGERY — COLONOSCOPY WITH PROPOFOL
Anesthesia: General

## 2023-08-17 MED ORDER — PROPOFOL 500 MG/50ML IV EMUL
INTRAVENOUS | Status: DC | PRN
  Administered 2023-08-17: 150 ug/kg/min via INTRAVENOUS

## 2023-08-17 MED ORDER — LIDOCAINE HCL (CARDIAC) PF 100 MG/5ML IV SOSY
PREFILLED_SYRINGE | INTRAVENOUS | Status: DC | PRN
  Administered 2023-08-17: 50 mg via INTRAVENOUS

## 2023-08-17 MED ORDER — LACTATED RINGERS IV SOLN
INTRAVENOUS | Status: DC | PRN
Start: 2023-08-17 — End: 2023-08-17

## 2023-08-17 NOTE — Anesthesia Preprocedure Evaluation (Signed)
Anesthesia Evaluation  Patient identified by MRN, date of birth, ID band Patient awake    Reviewed: Allergy & Precautions, H&P , NPO status , Patient's Chart, lab work & pertinent test results, reviewed documented beta blocker date and time   Airway Mallampati: II  TM Distance: >3 FB Neck ROM: full    Dental no notable dental hx.    Pulmonary neg pulmonary ROS, shortness of breath, pneumonia, COPD, former smoker   Pulmonary exam normal breath sounds clear to auscultation       Cardiovascular Exercise Tolerance: Good hypertension, negative cardio ROS + Valvular Problems/Murmurs  Rhythm:regular Rate:Normal     Neuro/Psych  PSYCHIATRIC DISORDERS  Depression    negative neurological ROS  negative psych ROS   GI/Hepatic negative GI ROS, Neg liver ROS,GERD  ,,  Endo/Other  negative endocrine ROS    Renal/GU negative Renal ROS  negative genitourinary   Musculoskeletal   Abdominal   Peds  Hematology negative hematology ROS (+) Blood dyscrasia, anemia   Anesthesia Other Findings   Reproductive/Obstetrics negative OB ROS                             Anesthesia Physical Anesthesia Plan  ASA: 3  Anesthesia Plan: General   Post-op Pain Management:    Induction:   PONV Risk Score and Plan: Propofol infusion  Airway Management Planned:   Additional Equipment:   Intra-op Plan:   Post-operative Plan:   Informed Consent: I have reviewed the patients History and Physical, chart, labs and discussed the procedure including the risks, benefits and alternatives for the proposed anesthesia with the patient or authorized representative who has indicated his/her understanding and acceptance.     Dental Advisory Given  Plan Discussed with: CRNA  Anesthesia Plan Comments:        Anesthesia Quick Evaluation

## 2023-08-17 NOTE — Op Note (Signed)
[facility] Patient Name: Michael Ellison Procedure Date: 08/17/2023 2:40 PM MRN: [id number] Date of Birth: 1946-02-08 Attending MD: Gennette Pac , MD, [id number] CSN: [id number] Age: 77 Admit Type: Outpatient Procedure:                Colonoscopy Indications:              Hematochezia, Abnormal CT of the GI tract Providers:                Gennette Pac, MD, Edrick Kins, RN,                            Zena Amos Referring MD:              Medicines:                Propofol per Anesthesia Complications:            No immediate complications. Estimated Blood Loss:     Estimated blood loss was minimal. Procedure:                Pre-Anesthesia Assessment:                           - Prior to the procedure, a History and Physical                            was performed, and patient medications and                            allergies were reviewed. The patient's tolerance of                            previous anesthesia was also reviewed. The risks                            and benefits of the procedure and the sedation                            options and risks were discussed with the patient.                            All questions were answered, and informed consent                            was obtained. Prior Anticoagulants: The patient has                            taken no anticoagulant or antiplatelet agents. ASA                            Grade Assessment: III - A patient with severe                            systemic disease. After reviewing the risks and  benefits, the patient was deemed in satisfactory                            condition to undergo the procedure.                           After obtaining informed consent, the colonoscope                            was passed under direct vision. Throughout the                            procedure, the patient's blood pressure, pulse, and                             oxygen saturations were monitored continuously. The                            [id number]) scope was introduced through the                            anus and advanced to the the cecum, identified by                            appendiceal orifice and ileocecal valve. The                            colonoscopy was performed without difficulty. The                            patient tolerated the procedure well. The quality                            of the bowel preparation was adequate. The                            ileocecal valve, appendiceal orifice, and rectum                            were photographed. Scope In: 3:00:49 PM Scope Out: 3:19:44 PM Scope Withdrawal Time: 0 hours 11 minutes 44 seconds  Total Procedure Duration: 0 hours 18 minutes 55 seconds  Findings:      The perianal and digital rectal examinations were normal.      Rectum and colon diffusely abnormal to 35 cm with geographic       ulcerations/ erosions /edema; complete loss of the normal vascular       pattern. Beginning 35 cm and proximally, the colonic mucosa appeared       normal aside from pancolonic diverticula.      Biopsies of the rectum and sigmoid colon were taken for histologic study Impression:               - Left-sided proctocolitis findings most consistent  with idiopathic inflammatory bowel disease status                            post biopsy                           -Pancolonic diverticulosis Moderate Sedation:      Moderate (conscious) sedation was personally administered by an       anesthesia professional. The following parameters were monitored: oxygen       saturation, heart rate, blood pressure, respiratory rate, EKG, adequacy       of pulmonary ventilation, and response to care. Recommendation:           - Patient has a contact number available for                            emergencies. The signs and symptoms of potential                             delayed complications were discussed with the                            patient. Return to normal activities tomorrow.                            Written discharge instructions were provided to the                            patient.                           - Advance diet as tolerated. Further                            recommendations to follow pending review of                            pathology report Procedure Code(s):        --- Professional ---                           [phone], Colonoscopy, flexible; diagnostic, including                            collection of specimen(s) by brushing or washing,                            when performed (separate procedure) Diagnosis Code(s):        --- Professional ---                           K92.1, Melena (includes Hematochezia)                           R93.3, Abnormal findings on diagnostic imaging of  other parts of digestive tract CPT copyright 2022 [employer]. All rights reserved. The codes documented in this report are preliminary and upon coder review may  be revised to meet current compliance requirements. Gerrit Friends. Hershy Flenner, MD Gennette Pac, MD 08/17/2023 3:29:58 PM This report has been signed electronically. Number of Addenda: 0

## 2023-08-17 NOTE — Transfer of Care (Signed)
Immediate Anesthesia Transfer of Care Note  Patient: CHEZ CHANNER  Procedure(s) Performed: COLONOSCOPY WITH PROPOFOL BIOPSY  Patient Location: Short Stay  Anesthesia Type:General  Level of Consciousness: awake, alert , and patient cooperative  Airway & Oxygen Therapy: Patient Spontanous Breathing  Post-op Assessment: Report given to RN, Post -op Vital signs reviewed and stable, and Patient moving all extremities X 4  Post vital signs: Reviewed and stable  Last Vitals:  Vitals Value Taken Time  BP 106/65 08/17/23 1528  Temp 36.7 C 08/17/23 1528  Pulse 69 08/17/23 1528  Resp 20 08/17/23 1528  SpO2 100 % 08/17/23 1528    Last Pain:  Vitals:   08/17/23 1528  TempSrc: Oral  PainSc: 0-No pain      Patients Stated Pain Goal: 7 (08/17/23 1216)  Complications: No notable events documented.

## 2023-08-17 NOTE — H&P (Signed)
@LOGO @   Primary Care Physician:  Benetta Spar, MD Primary Gastroenterologist:  Dr. Jena Gauss  Pre-Procedure History & Physical: HPI:  Michael Ellison is a 76 y.o. male here for   For colonoscopy.  Thickening of the sigmoid colon recently treated with antibiotics.  Had left-sided abdominal pain abdominal pain is resolved.  History of advanced adenomas removed previously.  He is here for a colonoscopy per plan.  Past Medical History:  Diagnosis Date   Arthritis    Aspiration pneumonia (HCC) 03/01/2012   Cancer of prostate (HCC)    COPD (chronic obstructive pulmonary disease) (HCC)    Depression    Dyspnea    GERD (gastroesophageal reflux disease)    Heart murmur    Hernia of abdominal cavity    Hypertension    Leaky heart valve    On home O2 PRN-does not use all the time.    3L prn    Past Surgical History:  Procedure Laterality Date   BOWEL RESECTION  02/25/2012   Procedure: SMALL BOWEL RESECTION;  Surgeon: [name], MD;  Location: [facility];  Service: General;;   COLONOSCOPY N/A 05/27/2016   Dr.Dena Esperanza: 14 mm polyp in the sigmoid colon removed, tubular adenoma.  8 mm polyp in the descending colon removed, tubular adenoma.  Diverticulosis.  Recommend colonoscopy 3 years   COLONOSCOPY WITH PROPOFOL N/A 09/27/2019   Procedure: COLONOSCOPY WITH PROPOFOL;  Surgeon: Corbin Ade, MD;  Location: [facility];  Service: Endoscopy;  Laterality: N/A;  12:30pm   HERNIA REPAIR     INCISION AND DRAINAGE ABSCESS N/A 01/15/2013   Procedure: INCISION AND DRAINAGE Abdominal Wall ABSCESS;  Surgeon: Cherylynn Ridges, MD;  Location: [facility];  Service: General;  Laterality: N/A;   INGUINAL HERNIA REPAIR Right 09/16/2017   Procedure: RIGHT INGUINAL HERNIORRHAPHY  WITH MESH;  Surgeon: Franky Macho, MD;  Location: [facility];  Service: General;  Laterality: Right;   INSERTION OF MESH N/A 12/25/2012   Procedure: INSERTION OF MESH;  Surgeon: Cherylynn Ridges, MD;  Location: [facility];  Service: General;   Laterality: N/A;   INSERTION OF MESH N/A 08/26/2016   Procedure: INSERTION OF MESH;  Surgeon: Jimmye Norman, MD;  Location: [facility];  Service: General;  Laterality: N/A;   LAPAROTOMY  02/25/2012   Procedure: EXPLORATORY LAPAROTOMY;  Surgeon: [name], MD;  Location: [facility];  Service: General;  Laterality: N/A;   POLYPECTOMY  05/27/2016   Procedure: POLYPECTOMY;  Surgeon: Corbin Ade, MD;  Location: [facility];  Service: Endoscopy;;  Descending colon polyp and Sigmoid colon polyp removed via hot snare   POLYPECTOMY  09/27/2019   Procedure: POLYPECTOMY;  Surgeon: Corbin Ade, MD;  Location: [facility];  Service: Endoscopy;;   PROSTATE BIOPSY     robotic prostate surgery  2011   TRACHEOSTOMY  03/2012   VENTRAL HERNIA REPAIR  12/25/2012    Dr Lindie Spruce   VENTRAL HERNIA REPAIR N/A 12/25/2012   Procedure: HERNIA REPAIR VENTRAL ADULT;  Surgeon: Cherylynn Ridges, MD;  Location: [facility];  Service: General;  Laterality: N/A;   VENTRAL HERNIA REPAIR N/A 08/26/2016   Procedure: VENTRAL INCISIONAL HERNIA REPAIR WITH MESH;  Surgeon: Jimmye Norman, MD;  Location: [facility] OR;  Service: General;  Laterality: N/A;   WOUND DEBRIDEMENT  03/02/2012   Procedure: DEBRIDEMENT CLOSURE/ABDOMINAL WOUND;  Surgeon: Cherylynn Ridges, MD;  Location: [facility];  Service: General;  Laterality: N/A;   ZENKER'S DIVERTICULECTOMY  2013  Dr. Jenne Pane    Prior to Admission medications   Medication Sig Start Date End Date Taking? Authorizing Provider  aspirin EC 81 MG tablet Take 81 mg by mouth as needed. Takes 1 to 2 a month.   Yes [provider]  bisoprolol (ZEBETA) 5 MG tablet Take 0.5 tablets (2.5 mg total) by mouth daily after supper. 06/28/23  Yes Jonelle Sidle, MD  furosemide (LASIX) 40 MG tablet Take 40 mg by mouth daily. 06/08/23  Yes [provider]  gabapentin (NEURONTIN) 300 MG capsule Take 300 mg by mouth as needed.   Yes [provider]  GAVILYTE-G 236 g solution SMARTSIG:Milliliter(s) By Mouth  07/07/23  Yes [provider]  hydrochlorothiazide (HYDRODIURIL) 25 MG tablet Take 25 mg by mouth daily. 04/04/23  Yes [provider]  ibuprofen (ADVIL,MOTRIN) 200 MG tablet Take 400 mg every 6 (six) hours as needed by mouth for headache or moderate pain.   Yes [provider]  ipratropium-albuterol (DUONEB) 0.5-2.5 (3) MG/3ML SOLN Take 3 mLs by nebulization every 6 (six) hours as needed (for shortness of breath/wheezing).    Yes [provider]  lubiprostone (AMITIZA) 24 MCG capsule Take one capsule once to twice daily for constipation. 04/29/23  Yes Tiffany Kocher, PA-C  meclizine (ANTIVERT) 25 MG tablet Take 25 mg by mouth 3 (three) times daily. 05/04/23  Yes [provider]  meloxicam (MOBIC) 7.5 MG tablet SMARTSIG:1 Tablet(s) By Mouth Morning-Evening 04/04/23  Yes [provider]  olmesartan (BENICAR) 20 MG tablet One daily 07/12/23  Yes Nyoka Cowden, MD  potassium chloride SA (KLOR-CON M) 20 MEQ tablet Take 20 mEq by mouth daily. 06/08/23  Yes [provider]  sertraline (ZOLOFT) 25 MG tablet Take 25 mg by mouth daily. 04/04/23  Yes [provider]  SYMBICORT 160-4.5 MCG/ACT inhaler Inhale 1 puff into the lungs 2 (two) times daily. 04/04/23  Yes [provider]  tamsulosin (FLOMAX) 0.4 MG CAPS capsule Take 1 capsule by mouth daily. 07/05/18  Yes [provider]  acetaminophen (TYLENOL) 500 MG tablet Take 500 mg by mouth every 6 (six) hours as needed for headache.     [provider]    Allergies as of 07/07/2023 - Review Complete 07/05/2023  Allergen Reaction Noted   Lorazepam Other (See Comments) 02/27/2012    Family History  Problem Relation Age of Onset   Hypertension Mother    Pseudochol deficiency Neg Hx    Anesthesia problems Neg Hx    Hypotension Neg Hx    Malignant hyperthermia Neg Hx    Colon cancer Neg Hx     Social History   Socioeconomic History   Marital status: Divorced     Spouse name: Not on file   Number of children: Not on file   Years of education: Not on file   Highest education level: Not on file  Occupational History   Occupation: retired    Comment: [employer]  Tobacco Use   Smoking status: Former    Current packs/day: 0.00    Average packs/day: 1.5 packs/day for 44.0 years (66.0 ttl pk-yrs)    Types: Cigarettes    Start date: 02/22/1968    Quit date: 02/22/2012    Years since quitting: 11.4   Smokeless tobacco: Never   Tobacco comments:    Quit x 4 years  Vaping Use   Vaping status: Never Used  Substance and Sexual Activity   Alcohol use: No    Alcohol/week: 0.0 standard drinks  of alcohol    Comment: sober since ~ 02/2012   Drug use: No   Sexual activity: Not Currently  Other Topics Concern   Not on file  Social History Narrative   Not on file   Social Determinants of Health   Financial Resource Strain: Not on file  Food Insecurity: Not on file  Transportation Needs: Not on file  Physical Activity: Not on file  Stress: Not on file  Social Connections: Not on file  Intimate Partner Violence: Not on file    Review of Systems: See HPI, otherwise negative ROS  Physical Exam: BP 134/78 (BP Location: Right Arm)   Pulse 72   Temp 98.2 F (36.8 C)   Resp 18   Ht 6' 2.5" (1.892 m)   Wt 91.6 kg   SpO2 98%   BMI 25.58 kg/m  General:   Alert,  Well-developed, well-nourished, pleasant and cooperative in NAD Lungs:  Clear throughout to auscultation.   No wheezes, crackles, or rhonchi. No acute distress. Heart:  Regular rate and rhythm; no murmurs, clicks, rubs,  or gallops. Abdomen: Non-distended, normal bowel sounds.  Soft and nontender without appreciable mass or hepatosplenomegaly.     Impression/recommendations:   77 year old gentleman with left-sided abdominal pain recently thickening of the colon on CT responded to course of antibiotics.  History of advanced adenoma by size removed previously.    Here for an updated colonoscopy  per plan. The risks, benefits, limitations, alternatives and imponderables have been reviewed with the patient. Questions have been answered. All parties are agreeable.      Notice: This dictation was prepared with Dragon dictation along with smaller phrase technology. Any transcriptional errors that result from this process are unintentional and may not be corrected upon review.

## 2023-08-17 NOTE — Discharge Instructions (Signed)
  Colonoscopy Discharge Instructions  Read the instructions outlined below and refer to this sheet in the next few weeks. These discharge instructions provide you with general information on caring for yourself after you leave the hospital. Your doctor may also give you specific instructions. While your treatment has been planned according to the most current medical practices available, unavoidable complications occasionally occur. If you have any problems or questions after discharge, call Dr. Jena Gauss at [phone]. ACTIVITY You may resume your regular activity, but move at a slower pace for the next 24 hours.  Take frequent rest periods for the next 24 hours.  Walking will help get rid of the air and reduce the bloated feeling in your belly (abdomen).  No driving for 24 hours (because of the medicine (anesthesia) used during the test).   Do not sign any important legal documents or operate any machinery for 24 hours (because of the anesthesia used during the test).  NUTRITION Drink plenty of fluids.  You may resume your normal diet as instructed by your doctor.  Begin with a light meal and progress to your normal diet. Heavy or fried foods are harder to digest and may make you feel sick to your stomach (nauseated).  Avoid alcoholic beverages for 24 hours or as instructed.  MEDICATIONS You may resume your normal medications unless your doctor tells you otherwise.  WHAT YOU CAN EXPECT TODAY Some feelings of bloating in the abdomen.  Passage of more gas than usual.  Spotting of blood in your stool or on the toilet paper.  IF YOU HAD POLYPS REMOVED DURING THE COLONOSCOPY: No aspirin products for 7 days or as instructed.  No alcohol for 7 days or as instructed.  Eat a soft diet for the next 24 hours.  FINDING OUT THE RESULTS OF YOUR TEST Not all test results are available during your visit. If your test results are not back during the visit, make an appointment with your caregiver to find out the  results. Do not assume everything is normal if you have not heard from your caregiver or the medical facility. It is important for you to follow up on all of your test results.  SEEK IMMEDIATE MEDICAL ATTENTION IF: You have more than a spotting of blood in your stool.  Your belly is swollen (abdominal distention).  You are nauseated or vomiting.  You have a temperature over 101.  You have abdominal pain or discomfort that is severe or gets worse throughout the day.      Your rectum and the left side of your colon is very inflamed.  Biopsies were taken  You have diverticulosis as well.   no sign of polyps or cancer  Further recommendations to follow pending review of the pathology report   at patient request, I called Elberta Spaniel at [phone] findings and recommendations

## 2023-08-19 LAB — SURGICAL PATHOLOGY

## 2023-08-22 DIAGNOSIS — K5904 Chronic idiopathic constipation: Secondary | ICD-10-CM | POA: Diagnosis not present

## 2023-08-22 DIAGNOSIS — M1711 Unilateral primary osteoarthritis, right knee: Secondary | ICD-10-CM | POA: Diagnosis not present

## 2023-08-22 DIAGNOSIS — I700 Atherosclerosis of aorta: Secondary | ICD-10-CM | POA: Diagnosis not present

## 2023-08-22 DIAGNOSIS — Z0001 Encounter for general adult medical examination with abnormal findings: Secondary | ICD-10-CM | POA: Diagnosis not present

## 2023-08-22 DIAGNOSIS — J449 Chronic obstructive pulmonary disease, unspecified: Secondary | ICD-10-CM | POA: Diagnosis not present

## 2023-08-22 DIAGNOSIS — I5020 Unspecified systolic (congestive) heart failure: Secondary | ICD-10-CM | POA: Diagnosis not present

## 2023-08-22 DIAGNOSIS — Z1389 Encounter for screening for other disorder: Secondary | ICD-10-CM | POA: Diagnosis not present

## 2023-08-22 DIAGNOSIS — I10 Essential (primary) hypertension: Secondary | ICD-10-CM | POA: Diagnosis not present

## 2023-08-22 NOTE — Anesthesia Postprocedure Evaluation (Signed)
Anesthesia Post Note  Patient: Michael Ellison  Procedure(s) Performed: COLONOSCOPY WITH PROPOFOL BIOPSY  Patient location during evaluation: Phase II Anesthesia Type: General Level of consciousness: awake Pain management: pain level controlled Vital Signs Assessment: post-procedure vital signs reviewed and stable Respiratory status: spontaneous breathing and respiratory function stable Cardiovascular status: blood pressure returned to baseline and stable Postop Assessment: no headache and no apparent nausea or vomiting Anesthetic complications: no Comments: Late entry   No notable events documented.   Last Vitals:  Vitals:   08/17/23 1216 08/17/23 1528  BP: 134/78 106/65  Pulse: 72 69  Resp: 18 20  Temp: 36.8 C 36.7 C  SpO2: 98% 100%    Last Pain:  Vitals:   08/17/23 1528  TempSrc: Oral  PainSc: 0-No pain                 Windell Norfolk

## 2023-08-23 ENCOUNTER — Encounter (HOSPITAL_COMMUNITY): Payer: Self-pay | Admitting: Internal Medicine

## 2023-08-25 ENCOUNTER — Other Ambulatory Visit: Payer: Self-pay

## 2023-08-25 DIAGNOSIS — K51211 Ulcerative (chronic) proctitis with rectal bleeding: Secondary | ICD-10-CM

## 2023-08-25 MED ORDER — MESALAMINE 1.2 G PO TBEC: 4.8000 g | DELAYED_RELEASE_TABLET | Freq: Every day | ORAL | 11 refills | Status: DC

## 2023-09-07 DIAGNOSIS — K51211 Ulcerative (chronic) proctitis with rectal bleeding: Secondary | ICD-10-CM | POA: Diagnosis not present

## 2023-09-14 ENCOUNTER — Ambulatory Visit: Payer: Medicare Other | Attending: Student | Admitting: Student

## 2023-09-14 ENCOUNTER — Encounter: Payer: Self-pay | Admitting: Student

## 2023-09-14 VITALS — BP 136/74 | HR 86 | Wt 202.0 lb

## 2023-09-14 DIAGNOSIS — R002 Palpitations: Secondary | ICD-10-CM | POA: Diagnosis not present

## 2023-09-14 DIAGNOSIS — R600 Localized edema: Secondary | ICD-10-CM

## 2023-09-14 DIAGNOSIS — J449 Chronic obstructive pulmonary disease, unspecified: Secondary | ICD-10-CM | POA: Diagnosis not present

## 2023-09-14 DIAGNOSIS — I10 Essential (primary) hypertension: Secondary | ICD-10-CM

## 2023-09-14 DIAGNOSIS — R0989 Other specified symptoms and signs involving the circulatory and respiratory systems: Secondary | ICD-10-CM | POA: Diagnosis not present

## 2023-09-14 NOTE — Progress Notes (Signed)
Cardiology Office Note    Date:  09/14/2023  ID:  [name], [name] [birth year], MRN [id number] Cardiologist: Nona Dell, MD    History of Present Illness:    Michael Ellison is a 77 y.o. male with past medical history of HTN, HLD, COPD and prostate cancer who presents to the office today for follow-up.  He was examined by Dr. Diona Browner in 05/2023 as a new patient referral for lower extremity edema and palpitations. Reported having chronic dyspnea on exertion in the setting of emphysema and was on supplemental oxygen as needed. He had been started on Lasix by his PCP and an echocardiogram was recommended for further assessment. A 7-day Zio patch was also recommended to assess for any significant arrhythmias. He was also noted to have a bruit on examination with carotid dopplers recommended as well.  His echocardiogram showed a preserved EF of 60 to 65% with no regional wall motion abnormalities. RV function was normal with normal PASP and only mild MR. Carotid dopplers showed minimal heterogeneous plaque with no significant stenosis. His Zio patch showed predominantly normal sinus rhythm with an average heart rate of 90 bpm. He did have occasional PAC's and PVC's representing a 1.7% and 1.3% burden respectively. Was also noted to have multiple, brief episodes of SVT with the longest lasting for 17 beats but no significant pauses or heart block. It was recommended to try Bisoprolol 2.5 mg daily to see if this would help with his symptoms.  In talking with the patient and his daughter today, he reports overall doing well since his last office visit. He does have intermittent dyspnea on exertion but does not have to utilize supplemental oxygen on a regular basis. No specific orthopnea or PND.  Reports his lower extremity edema has improved since being on Lasix and also utilizing compression stockings. He is listed as being on HCTZ as well but reports his PCP did discontinue this upon initiation  of Lasix. Denies any recent exertional chest pain.Reports his palpitations did improve with initiation of Bisoprolol.  Studies Reviewed:   EKG: EKG is not ordered today.  Event Monitor: 06/2023 ZIO monitor reviewed.  6 days, 6 hours analyzed.   Predominant rhythm is sinus with heart rate ranging from 60 bpm up to 135 bpm and average heart rate 90 bpm. There were occasional PACs representing 1.7% total beats with otherwise rare atrial couplets and triplets. There were occasional PVCs representing 1.3% total beats with otherwise rare ventricular couplets and triplets as well as limited episodes of ventricular bigeminy and trigeminy.  Single 4 beat run of NSVT noted. Multiple, relatively brief episodes of PSVT were noted, the longest of which lasted 17 beats.  No sustained atrial arrhythmias. No pauses or heart block.  Echocardiogram: 06/2023 IMPRESSIONS     1. Left ventricular ejection fraction, by estimation, is 60 to 65%. The  left ventricle has normal function. The left ventricle has no regional  wall motion abnormalities. Left ventricular diastolic parameters are  indeterminate.   2. Right ventricular systolic function is normal. The right ventricular  size is normal. There is normal pulmonary artery systolic pressure.   3. Left atrial size was severely dilated.   4. The mitral valve is normal in structure. Mild mitral valve  regurgitation. No evidence of mitral stenosis.   5. The aortic valve is tricuspid. Aortic valve regurgitation is not  visualized. No aortic stenosis is present.   6. The inferior vena cava is normal in size with greater than  50%  respiratory variability, suggesting right atrial pressure of 3 mmHg.    Carotid Dopplers: 06/2023 IMPRESSION: Color duplex indicates minimal heterogeneous plaque, with no hemodynamically significant stenosis by duplex criteria in the extracranial cerebrovascular circulation.   Physical Exam:   VS:  BP 136/74   Pulse 86    Wt 202 lb (91.6 kg)   SpO2 97%   BMI 25.59 kg/m    Wt Readings from Last 3 Encounters:  09/14/23 202 lb (91.6 kg)  08/17/23 201 lb 15.1 oz (91.6 kg)  08/09/23 202 lb (91.6 kg)     GEN: Well nourished, well developed male appearing in no acute distress NECK: No JVD; No carotid bruits CARDIAC: RRR, 2/6 systolic murmur along RUSB.  RESPIRATORY:  Clear to auscultation without rales, wheezing or rhonchi  ABDOMEN: Appears non-distended. No obvious abdominal masses. EXTREMITIES: No clubbing or cyanosis. No pitting edema.  Distal pedal pulses are 2+ bilaterally.   Assessment and Plan:   1. Palpitations/SVT - Recent monitor showed predominantly normal sinus rhythm with rare PAC's and PVC's and brief episodes of SVT with the longest lasting for 17 beats. He has experienced improvement in symptoms since starting Bisoprolol 2.5 mg daily. Will continue with this. Also encouraged him to reduce his caffeine intake as he consumes several cups of coffee a day along with tea.  2. Lower Extremity Edema - Recent echocardiogram was reassuring and showed a preserved EF as outlined above with no significant valve abnormalities. Symptoms have improved since being switched from HCTZ to Lasix 40 mg daily and also utilizing compression stockings. He was encouraged to continue to follow a low-sodium diet. Continue current medical therapy for now.  3. HTN - Blood pressure is well-controlled at 136/74 during today's visit. Continue current medical therapy with Bisoprolol 2.5 mg daily and Olmesartan 20 mg daily.  4. COPD - Followed by [facility]. He has supplemental oxygen to use as needed but has not utilized this routinely. Remains on Symbicort.  5. Carotid Bruits - Recent carotid dopplers showed minimal heterogeneous plaque with no significant stenosis. Will request a copy of most recent labs from his PCP to include an FLP.  Signed, Ellsworth Lennox, PA-C

## 2023-09-14 NOTE — Patient Instructions (Signed)
Medication Instructions:   Your physician recommends that you continue on your current medications as directed. Please refer to the Current Medication list given to you today.   Labwork: None today  Testing/Procedures: None today  Follow-Up: 6 months Dr.McDowell  Any Other Special Instructions Will Be Listed Below (If Applicable).  If you need a refill on your cardiac medications before your next appointment, please call your pharmacy.

## 2023-09-16 LAB — C-REACTIVE PROTEIN: CRP: 24 mg/L — ABNORMAL HIGH (ref <8.0)

## 2023-09-16 LAB — CALPROTECTIN: Calprotectin: 5110 ug/g — ABNORMAL HIGH

## 2023-09-19 ENCOUNTER — Encounter: Payer: Self-pay | Admitting: Internal Medicine

## 2023-09-22 DIAGNOSIS — I10 Essential (primary) hypertension: Secondary | ICD-10-CM | POA: Diagnosis not present

## 2023-09-22 DIAGNOSIS — J449 Chronic obstructive pulmonary disease, unspecified: Secondary | ICD-10-CM | POA: Diagnosis not present

## 2023-10-23 DIAGNOSIS — J449 Chronic obstructive pulmonary disease, unspecified: Secondary | ICD-10-CM | POA: Diagnosis not present

## 2023-10-23 DIAGNOSIS — I10 Essential (primary) hypertension: Secondary | ICD-10-CM | POA: Diagnosis not present

## 2023-11-15 ENCOUNTER — Other Ambulatory Visit: Payer: Self-pay | Admitting: Internal Medicine

## 2023-11-22 NOTE — Progress Notes (Deleted)
 GI Office Note    Referring Provider: Carlette Benita Area* Primary Care Physician:  Carlette Benita Area, MD  Primary Gastroenterologist: Ozell Hollingshead, MD   Chief Complaint   No chief complaint on file.   History of Present Illness   Michael Ellison is a 78 y.o. male presenting today for follow-up.  Colonoscopy completed back in October 2024, noted to have left-sided proctocolitis most consistent with ulcerative colitis.Labs from November 2024: Fecal calprotectin 5110, CRP 24.  Creatinine 0.91.  Patient was started on the alga.  CT abdomen pelvis August 2024: IMPRESSION: 1. Mild wall thickening of the rectosigmoid colon with associated diverticulosis, mild adjacent inflammatory change and stranding of the associated small bowel mesentery. Findings may be due to acute uncomplicated diverticulitis, although no discrete inflamed diverticula are visualized and segment of involvement is somewhat longer than expected, colitis is additional consideration. 2. No evidence of obstruction. 3. Aortic Atherosclerosis (ICD10-I70.0).  Colonoscopy October 2024: -Left-sided proctocolitis most consistent with idiopathic inflammatory bowel disease status post biopsy, biopsy showed chronic active colitis most suspicious for inflammatory bowel disease/ulcerative colitis -Pancolonic diverticulosis  Today:  Medications   Current Outpatient Medications  Medication Sig Dispense Refill   acetaminophen  (TYLENOL ) 500 MG tablet Take 500 mg by mouth every 6 (six) hours as needed for headache.      aspirin  EC 81 MG tablet Take 81 mg by mouth as needed. Takes 1 to 2 a month. (Patient not taking: Reported on 09/14/2023)     bisoprolol  (ZEBETA ) 5 MG tablet Take 0.5 tablets (2.5 mg total) by mouth daily after supper. 45 tablet 3   furosemide  (LASIX ) 40 MG tablet Take 40 mg by mouth daily.     gabapentin  (NEURONTIN ) 300 MG capsule Take 300 mg by mouth as needed.     GAVILYTE-G 236 g solution  SMARTSIG:Milliliter(s) By Mouth     ipratropium-albuterol  (DUONEB) 0.5-2.5 (3) MG/3ML SOLN Take 3 mLs by nebulization every 6 (six) hours as needed (for shortness of breath/wheezing).      lubiprostone  (AMITIZA ) 24 MCG capsule Take one capsule once to twice daily for constipation. 60 capsule 3   meclizine (ANTIVERT) 25 MG tablet Take 25 mg by mouth 3 (three) times daily.     meloxicam (MOBIC) 7.5 MG tablet SMARTSIG:1 Tablet(s) By Mouth Morning-Evening     mesalamine  (LIALDA ) 1.2 g EC tablet Take 4 tablets (4.8 g total) by mouth daily with breakfast. 120 tablet 11   olmesartan  (BENICAR ) 20 MG tablet One daily 30 tablet 11   potassium chloride  SA (KLOR-CON  M) 20 MEQ tablet Take 20 mEq by mouth daily.     sertraline  (ZOLOFT ) 25 MG tablet Take 25 mg by mouth daily.     SYMBICORT  160-4.5 MCG/ACT inhaler Inhale 1 puff into the lungs 2 (two) times daily.     tamsulosin  (FLOMAX ) 0.4 MG CAPS capsule Take 1 capsule by mouth daily.     No current facility-administered medications for this visit.    Allergies   Allergies as of 11/23/2023 - Review Complete 09/14/2023  Allergen Reaction Noted   Lorazepam  Other (See Comments) 02/27/2012     Past Medical History   Past Medical History:  Diagnosis Date   Arthritis    Aspiration pneumonia (HCC) 03/01/2012   Cancer of prostate (HCC)    COPD (chronic obstructive pulmonary disease) (HCC)    Depression    Dyspnea    GERD (gastroesophageal reflux disease)    Heart murmur    Hernia of abdominal cavity  Hypertension    Leaky heart valve    On home O2 PRN-does not use all the time.    3L prn    Past Surgical History   Past Surgical History:  Procedure Laterality Date   BIOPSY  08/17/2023   Procedure: BIOPSY;  Surgeon: Shaaron Lamar HERO, MD;  [facility];  Service: Endoscopy;;   BOWEL RESECTION  02/25/2012   Procedure: SMALL BOWEL RESECTION;  Surgeon: Thresa JAYSON Pulling, MD;  Location: [facility];  Service: General;;   COLONOSCOPY N/A  05/27/2016   Dr.Rourk: 14 mm polyp in the sigmoid colon removed, tubular adenoma.  8 mm polyp in the descending colon removed, tubular adenoma.  Diverticulosis.  Recommend colonoscopy 3 years   COLONOSCOPY WITH PROPOFOL  N/A 09/27/2019   Procedure: COLONOSCOPY WITH PROPOFOL ;  Surgeon: Shaaron Lamar HERO, MD;  [facility]: [facility];  Service: Endoscopy;  Laterality: N/A;  12:30pm   COLONOSCOPY WITH PROPOFOL  N/A 08/17/2023   Procedure: COLONOSCOPY WITH PROPOFOL ;  Surgeon: Shaaron Lamar HERO, MD;  [facility]: [facility];  Service: Endoscopy;  Laterality: N/A;  2:15 pm, asa 3   HERNIA REPAIR     INCISION AND DRAINAGE ABSCESS N/A 01/15/2013   Procedure: INCISION AND DRAINAGE Abdominal Wall ABSCESS;  Surgeon: Lynwood MALVA Pina, MD;  Location: [facility];  Service: General;  Laterality: N/A;   INGUINAL HERNIA REPAIR Right 09/16/2017   Procedure: RIGHT INGUINAL HERNIORRHAPHY  WITH MESH;  Surgeon: Mavis Anes, MD;  Location: [facility];  Service: General;  Laterality: Right;   INSERTION OF MESH N/A 12/25/2012   Procedure: INSERTION OF MESH;  Surgeon: Lynwood MALVA Pina, MD;  Location: [facility];  Service: General;  Laterality: N/A;   INSERTION OF MESH N/A 08/26/2016   Procedure: INSERTION OF MESH;  Surgeon: Lynwood Pina, MD;  Location: [facility] OR;  Service: General;  Laterality: N/A;   LAPAROTOMY  02/25/2012   Procedure: EXPLORATORY LAPAROTOMY;  Surgeon: Thresa JAYSON Pulling, MD;  Location: [facility];  Service: General;  Laterality: N/A;   POLYPECTOMY  05/27/2016   Procedure: POLYPECTOMY;  Surgeon: Lamar HERO Shaaron, MD;  [facility]: [facility];  Service: Endoscopy;;  Descending colon polyp and Sigmoid colon polyp removed via hot snare   POLYPECTOMY  09/27/2019   Procedure: POLYPECTOMY;  Surgeon: Shaaron Lamar HERO, MD;  [facility]: [facility];  Service: Endoscopy;;   PROSTATE BIOPSY     robotic prostate surgery  2011   TRACHEOSTOMY  03/2012   VENTRAL HERNIA REPAIR  12/25/2012    Dr Pina   VENTRAL HERNIA REPAIR N/A 12/25/2012   Procedure: HERNIA  REPAIR VENTRAL ADULT;  Surgeon: Lynwood MALVA Pina, MD;  Location: [facility] OR;  Service: General;  Laterality: N/A;   VENTRAL HERNIA REPAIR N/A 08/26/2016   Procedure: VENTRAL INCISIONAL HERNIA REPAIR WITH MESH;  Surgeon: Lynwood Pina, MD;  Location: [facility] OR;  Service: General;  Laterality: N/A;   WOUND DEBRIDEMENT  03/02/2012   Procedure: DEBRIDEMENT CLOSURE/ABDOMINAL WOUND;  Surgeon: Lynwood MALVA Pina, MD;  Location[facility] OR;  Service: General;  Laterality: N/A;   ZENKER'S DIVERTICULECTOMY  2013   Dr. Carlie    Past Family History   Family History  Problem Relation Age of Onset   Hypertension Mother    Pseudochol deficiency Neg Hx    Anesthesia problems Neg Hx    Hypotension Neg Hx    Malignant hyperthermia Neg Hx    Colon cancer Neg Hx     Past Social History   Social History   Socioeconomic  History   Marital status: Divorced    Spouse name: Not on file   Number of children: Not on file   Years of education: Not on file   Highest education level: Not on file  Occupational History   Occupation: retired    Comment: [employer]  Tobacco Use   Smoking status: Former    Current packs/day: 0.00    Average packs/day: 1.5 packs/day for 44.0 years (66.0 ttl pk-yrs)    Types: Cigarettes    Start date: 02/22/1968    Quit date: 02/22/2012    Years since quitting: 11.7   Smokeless tobacco: Never   Tobacco comments:    Quit x 4 years  Vaping Use   Vaping status: Never Used  Substance and Sexual Activity   Alcohol use: No    Alcohol/week: 0.0 standard drinks of alcohol    Comment: sober since ~ 02/2012   Drug use: No   Sexual activity: Not Currently  Other Topics Concern   Not on file  Social History Narrative   Not on file   Social Drivers of Health   Financial Resource Strain: Not on file  Food Insecurity: Not on file  Transportation Needs: Not on file  Physical Activity: Not on file  Stress: Not on file  Social Connections: Not on file  Intimate Partner Violence: Not on file    Review of  Systems   General: Negative for anorexia, weight loss, fever, chills, fatigue, weakness. ENT: Negative for hoarseness, difficulty swallowing , nasal congestion. CV: Negative for chest pain, angina, palpitations, dyspnea on exertion, peripheral edema.  Respiratory: Negative for dyspnea at rest, dyspnea on exertion, cough, sputum, wheezing.  GI: See history of present illness. GU:  Negative for dysuria, hematuria, urinary incontinence, urinary frequency, nocturnal urination.  Endo: Negative for unusual weight change.     Physical Exam   There were no vitals taken for this visit.   General: Well-nourished, well-developed in no acute distress.  Eyes: No icterus. Mouth: Oropharyngeal mucosa moist and pink , no lesions erythema or exudate. Lungs: Clear to auscultation bilaterally.  Heart: Regular rate and rhythm, no murmurs rubs or gallops.  Abdomen: Bowel sounds are normal, nontender, nondistended, no hepatosplenomegaly or masses,  no abdominal bruits or hernia , no rebound or guarding.  Rectal: ***  Extremities: No lower extremity edema. No clubbing or deformities. Neuro: Alert and oriented x 4   Skin: Warm and dry, no jaundice.   Psych: Alert and cooperative, normal mood and affect.  Labs   Lab Results  Component Value Date   NA 134 (L) 05/31/2023   CL 106 05/31/2023   K 3.6 05/31/2023   CO2 20 (L) 05/31/2023   BUN 16 05/31/2023   CREATININE 1.01 05/31/2023   GFRNONAA >60 05/31/2023   CALCIUM 8.5 (L) 05/31/2023   PHOS 3.7 09/01/2016   ALBUMIN  3.0 (L) 05/31/2023   GLUCOSE 91 05/31/2023   Lab Results  Component Value Date   ALT 20 05/31/2023   AST 24 05/31/2023   ALKPHOS 48 05/31/2023   BILITOT 0.7 05/31/2023   Lab Results  Component Value Date   WBC 9.0 05/31/2023   HGB 12.6 (L) 05/31/2023   HCT 38.6 (L) 05/31/2023   MCV 81.4 05/31/2023   PLT 254 05/31/2023    Imaging Studies   No results found.  Assessment       PLAN   ***   Sonny RAMAN. Ezzard, MHS,  PA-C [facility]

## 2023-11-23 ENCOUNTER — Encounter: Payer: Self-pay | Admitting: Gastroenterology

## 2023-11-23 ENCOUNTER — Ambulatory Visit: Payer: Medicare Other | Admitting: Gastroenterology

## 2023-11-23 DIAGNOSIS — J449 Chronic obstructive pulmonary disease, unspecified: Secondary | ICD-10-CM | POA: Diagnosis not present

## 2023-11-23 DIAGNOSIS — I10 Essential (primary) hypertension: Secondary | ICD-10-CM | POA: Diagnosis not present

## 2023-11-28 ENCOUNTER — Other Ambulatory Visit: Payer: Self-pay | Admitting: Internal Medicine

## 2023-12-21 DIAGNOSIS — I10 Essential (primary) hypertension: Secondary | ICD-10-CM | POA: Diagnosis not present

## 2023-12-21 DIAGNOSIS — J449 Chronic obstructive pulmonary disease, unspecified: Secondary | ICD-10-CM | POA: Diagnosis not present

## 2024-01-10 ENCOUNTER — Ambulatory Visit: Payer: Medicare Other | Admitting: Cardiology

## 2024-01-21 DIAGNOSIS — J449 Chronic obstructive pulmonary disease, unspecified: Secondary | ICD-10-CM | POA: Diagnosis not present

## 2024-01-21 DIAGNOSIS — I10 Essential (primary) hypertension: Secondary | ICD-10-CM | POA: Diagnosis not present

## 2024-02-04 ENCOUNTER — Emergency Department (HOSPITAL_COMMUNITY)

## 2024-02-04 ENCOUNTER — Inpatient Hospital Stay (HOSPITAL_COMMUNITY)
Admission: EM | Admit: 2024-02-04 | Discharge: 2024-02-10 | DRG: 163 | Disposition: A | Discharge status: Home Health | Attending: Internal Medicine | Admitting: Internal Medicine

## 2024-02-04 ENCOUNTER — Other Ambulatory Visit: Payer: Self-pay

## 2024-02-04 ENCOUNTER — Inpatient Hospital Stay (HOSPITAL_COMMUNITY)

## 2024-02-04 ENCOUNTER — Encounter (HOSPITAL_COMMUNITY): Payer: Self-pay | Admitting: Emergency Medicine

## 2024-02-04 DIAGNOSIS — Z9981 Dependence on supplemental oxygen: Secondary | ICD-10-CM | POA: Diagnosis not present

## 2024-02-04 DIAGNOSIS — Z791 Long term (current) use of non-steroidal anti-inflammatories (NSAID): Secondary | ICD-10-CM

## 2024-02-04 DIAGNOSIS — R079 Chest pain, unspecified: Secondary | ICD-10-CM | POA: Diagnosis not present

## 2024-02-04 DIAGNOSIS — I340 Nonrheumatic mitral (valve) insufficiency: Secondary | ICD-10-CM | POA: Diagnosis present

## 2024-02-04 DIAGNOSIS — J449 Chronic obstructive pulmonary disease, unspecified: Secondary | ICD-10-CM | POA: Diagnosis not present

## 2024-02-04 DIAGNOSIS — F32A Depression, unspecified: Secondary | ICD-10-CM | POA: Diagnosis present

## 2024-02-04 DIAGNOSIS — I4719 Other supraventricular tachycardia: Secondary | ICD-10-CM | POA: Diagnosis not present

## 2024-02-04 DIAGNOSIS — Z7982 Long term (current) use of aspirin: Secondary | ICD-10-CM | POA: Diagnosis not present

## 2024-02-04 DIAGNOSIS — F419 Anxiety disorder, unspecified: Secondary | ICD-10-CM | POA: Diagnosis present

## 2024-02-04 DIAGNOSIS — Z48813 Encounter for surgical aftercare following surgery on the respiratory system: Secondary | ICD-10-CM | POA: Diagnosis not present

## 2024-02-04 DIAGNOSIS — J9382 Other air leak: Secondary | ICD-10-CM | POA: Diagnosis not present

## 2024-02-04 DIAGNOSIS — N189 Chronic kidney disease, unspecified: Secondary | ICD-10-CM | POA: Diagnosis not present

## 2024-02-04 DIAGNOSIS — I517 Cardiomegaly: Secondary | ICD-10-CM | POA: Diagnosis not present

## 2024-02-04 DIAGNOSIS — K589 Irritable bowel syndrome without diarrhea: Secondary | ICD-10-CM | POA: Diagnosis present

## 2024-02-04 DIAGNOSIS — I471 Supraventricular tachycardia, unspecified: Secondary | ICD-10-CM | POA: Diagnosis present

## 2024-02-04 DIAGNOSIS — R0489 Hemorrhage from other sites in respiratory passages: Secondary | ICD-10-CM | POA: Diagnosis not present

## 2024-02-04 DIAGNOSIS — J9819 Other pulmonary collapse: Secondary | ICD-10-CM | POA: Diagnosis not present

## 2024-02-04 DIAGNOSIS — N400 Enlarged prostate without lower urinary tract symptoms: Secondary | ICD-10-CM | POA: Diagnosis present

## 2024-02-04 DIAGNOSIS — Z87891 Personal history of nicotine dependence: Secondary | ICD-10-CM

## 2024-02-04 DIAGNOSIS — K581 Irritable bowel syndrome with constipation: Secondary | ICD-10-CM | POA: Diagnosis present

## 2024-02-04 DIAGNOSIS — I129 Hypertensive chronic kidney disease with stage 1 through stage 4 chronic kidney disease, or unspecified chronic kidney disease: Secondary | ICD-10-CM | POA: Diagnosis not present

## 2024-02-04 DIAGNOSIS — J984 Other disorders of lung: Secondary | ICD-10-CM | POA: Diagnosis not present

## 2024-02-04 DIAGNOSIS — J430 Unilateral pulmonary emphysema [MacLeod's syndrome]: Secondary | ICD-10-CM | POA: Diagnosis not present

## 2024-02-04 DIAGNOSIS — E785 Hyperlipidemia, unspecified: Secondary | ICD-10-CM | POA: Diagnosis not present

## 2024-02-04 DIAGNOSIS — R0989 Other specified symptoms and signs involving the circulatory and respiratory systems: Secondary | ICD-10-CM | POA: Diagnosis not present

## 2024-02-04 DIAGNOSIS — J9811 Atelectasis: Secondary | ICD-10-CM | POA: Diagnosis not present

## 2024-02-04 DIAGNOSIS — J439 Emphysema, unspecified: Secondary | ICD-10-CM | POA: Diagnosis not present

## 2024-02-04 DIAGNOSIS — K219 Gastro-esophageal reflux disease without esophagitis: Secondary | ICD-10-CM | POA: Diagnosis not present

## 2024-02-04 DIAGNOSIS — J9621 Acute and chronic respiratory failure with hypoxia: Secondary | ICD-10-CM | POA: Diagnosis not present

## 2024-02-04 DIAGNOSIS — Z7951 Long term (current) use of inhaled steroids: Secondary | ICD-10-CM

## 2024-02-04 DIAGNOSIS — Z8546 Personal history of malignant neoplasm of prostate: Secondary | ICD-10-CM

## 2024-02-04 DIAGNOSIS — J930 Spontaneous tension pneumothorax: Secondary | ICD-10-CM | POA: Diagnosis not present

## 2024-02-04 DIAGNOSIS — Z8249 Family history of ischemic heart disease and other diseases of the circulatory system: Secondary | ICD-10-CM | POA: Diagnosis not present

## 2024-02-04 DIAGNOSIS — J939 Pneumothorax, unspecified: Secondary | ICD-10-CM | POA: Diagnosis not present

## 2024-02-04 DIAGNOSIS — R918 Other nonspecific abnormal finding of lung field: Secondary | ICD-10-CM | POA: Diagnosis not present

## 2024-02-04 DIAGNOSIS — I10 Essential (primary) hypertension: Secondary | ICD-10-CM | POA: Diagnosis not present

## 2024-02-04 DIAGNOSIS — J9311 Primary spontaneous pneumothorax: Secondary | ICD-10-CM | POA: Diagnosis not present

## 2024-02-04 DIAGNOSIS — I700 Atherosclerosis of aorta: Secondary | ICD-10-CM | POA: Diagnosis not present

## 2024-02-04 DIAGNOSIS — Z4682 Encounter for fitting and adjustment of non-vascular catheter: Secondary | ICD-10-CM | POA: Diagnosis not present

## 2024-02-04 DIAGNOSIS — Z79899 Other long term (current) drug therapy: Secondary | ICD-10-CM

## 2024-02-04 DIAGNOSIS — R0602 Shortness of breath: Secondary | ICD-10-CM | POA: Diagnosis not present

## 2024-02-04 DIAGNOSIS — J9611 Chronic respiratory failure with hypoxia: Secondary | ICD-10-CM | POA: Diagnosis present

## 2024-02-04 DIAGNOSIS — J90 Pleural effusion, not elsewhere classified: Secondary | ICD-10-CM | POA: Diagnosis not present

## 2024-02-04 DIAGNOSIS — J9383 Other pneumothorax: Secondary | ICD-10-CM | POA: Diagnosis not present

## 2024-02-04 DIAGNOSIS — T797XXA Traumatic subcutaneous emphysema, initial encounter: Secondary | ICD-10-CM | POA: Diagnosis not present

## 2024-02-04 LAB — BASIC METABOLIC PANEL WITH GFR
Anion gap: 8 (ref 5–15)
BUN: 15 mg/dL (ref 8–23)
CO2: 24 mmol/L (ref 22–32)
Calcium: 9.5 mg/dL (ref 8.9–10.3)
Chloride: 106 mmol/L (ref 98–111)
Creatinine, Ser: 1.02 mg/dL (ref 0.61–1.24)
GFR, Estimated: >60 mL/min (ref >60)
Glucose, Bld: 103 mg/dL — ABNORMAL HIGH (ref 70–99)
Potassium: 4.4 mmol/L (ref 3.5–5.1)
Sodium: 138 mmol/L (ref 135–145)

## 2024-02-04 LAB — CBC WITH DIFFERENTIAL/PLATELET
Abs Immature Granulocytes: 0.03 10*3/uL (ref 0.00–0.07)
Basophils Absolute: 0.1 10*3/uL (ref 0.0–0.1)
Basophils Relative: 1 %
Eosinophils Absolute: 0.2 10*3/uL (ref 0.0–0.5)
Eosinophils Relative: 2 %
HCT: 50.7 % (ref 39.0–52.0)
Hemoglobin: 16.3 g/dL (ref 13.0–17.0)
Immature Granulocytes: 0 %
Lymphocytes Relative: 22 %
Lymphs Abs: 2.1 10*3/uL (ref 0.7–4.0)
MCH: 26.8 pg (ref 26.0–34.0)
MCHC: 32.1 g/dL (ref 30.0–36.0)
MCV: 83.4 fL (ref 80.0–100.0)
Monocytes Absolute: 0.7 10*3/uL (ref 0.1–1.0)
Monocytes Relative: 7 %
Neutro Abs: 6.5 10*3/uL (ref 1.7–7.7)
Neutrophils Relative %: 68 %
Platelets: 227 10*3/uL (ref 150–400)
RBC: 6.08 MIL/uL — ABNORMAL HIGH (ref 4.22–5.81)
RDW: 17.2 % — ABNORMAL HIGH (ref 11.5–15.5)
WBC: 9.6 10*3/uL (ref 4.0–10.5)
nRBC: 0 % (ref 0.0–0.2)

## 2024-02-04 LAB — BLOOD GAS, VENOUS
Acid-Base Excess: 1.9 mmol/L (ref 0.0–2.0)
Bicarbonate: 29 mmol/L — ABNORMAL HIGH (ref 20.0–28.0)
Drawn by: 45381
O2 Saturation: 33 %
Patient temperature: 36.5
pCO2, Ven: 54 mmHg (ref 44–60)
pH, Ven: 7.34 (ref 7.25–7.43)
pO2, Ven: <31 mmHg — CL (ref 32–45)

## 2024-02-04 LAB — RESP PANEL BY RT-PCR (RSV, FLU A&B, COVID)  RVPGX2
Influenza A by PCR: NEGATIVE
Influenza B by PCR: NEGATIVE
Resp Syncytial Virus by PCR: NEGATIVE
SARS Coronavirus 2 by RT PCR: NEGATIVE

## 2024-02-04 LAB — HEPATIC FUNCTION PANEL
ALT: 16 U/L (ref 0–44)
AST: 17 U/L (ref 15–41)
Albumin: 4.1 g/dL (ref 3.5–5.0)
Alkaline Phosphatase: 55 U/L (ref 38–126)
Bilirubin, Direct: 0.2 mg/dL (ref 0.0–0.2)
Indirect Bilirubin: 0.4 mg/dL (ref 0.3–0.9)
Total Bilirubin: 0.6 mg/dL (ref 0.0–1.2)
Total Protein: 8.3 g/dL — ABNORMAL HIGH (ref 6.5–8.1)

## 2024-02-04 LAB — BRAIN NATRIURETIC PEPTIDE: B Natriuretic Peptide: 123 pg/mL — ABNORMAL HIGH (ref 0.0–100.0)

## 2024-02-04 LAB — MRSA NEXT GEN BY PCR, NASAL: MRSA by PCR Next Gen: DETECTED — AB

## 2024-02-04 MED ORDER — BISOPROLOL FUMARATE 5 MG PO TABS
2.5000 mg | ORAL_TABLET | Freq: Every day | ORAL | Status: DC
  Administered 2024-02-04 – 2024-02-09 (×6): 2.5 mg via ORAL
  Filled 2024-02-04 (×8): qty 0.5

## 2024-02-04 MED ORDER — MUPIROCIN 2 % EX OINT
1.0000 | TOPICAL_OINTMENT | Freq: Two times a day (BID) | CUTANEOUS | Status: AC
  Administered 2024-02-04 – 2024-02-09 (×10): 1 via NASAL
  Filled 2024-02-04: qty 22

## 2024-02-04 MED ORDER — ONDANSETRON HCL 4 MG PO TABS: 4.0000 mg | ORAL_TABLET | Freq: Four times a day (QID) | ORAL | Status: DC | PRN

## 2024-02-04 MED ORDER — ACETAMINOPHEN 325 MG PO TABS
650.0000 mg | ORAL_TABLET | Freq: Four times a day (QID) | ORAL | Status: DC | PRN
  Administered 2024-02-06: 650 mg via ORAL
  Filled 2024-02-04: qty 2

## 2024-02-04 MED ORDER — ALBUTEROL SULFATE (2.5 MG/3ML) 0.083% IN NEBU
2.5000 mg | INHALATION_SOLUTION | Freq: Once | RESPIRATORY_TRACT | Status: AC
  Administered 2024-02-04: 2.5 mg via RESPIRATORY_TRACT
  Filled 2024-02-04: qty 3

## 2024-02-04 MED ORDER — IPRATROPIUM-ALBUTEROL 0.5-2.5 (3) MG/3ML IN SOLN
3.0000 mL | Freq: Four times a day (QID) | RESPIRATORY_TRACT | Status: DC
  Administered 2024-02-04 – 2024-02-06 (×6): 3 mL via RESPIRATORY_TRACT
  Filled 2024-02-04 (×3): qty 3
  Filled 2024-02-04: qty 6
  Filled 2024-02-04: qty 3

## 2024-02-04 MED ORDER — METHYLPREDNISOLONE SODIUM SUCC 125 MG IJ SOLR
125.0000 mg | Freq: Once | INTRAMUSCULAR | Status: AC
  Administered 2024-02-04: 125 mg via INTRAVENOUS
  Filled 2024-02-04: qty 2

## 2024-02-04 MED ORDER — ACETAMINOPHEN 650 MG RE SUPP: 650.0000 mg | Freq: Four times a day (QID) | RECTAL | Status: DC | PRN

## 2024-02-04 MED ORDER — TRAZODONE HCL 50 MG PO TABS
50.0000 mg | ORAL_TABLET | Freq: Every evening | ORAL | Status: DC | PRN
  Administered 2024-02-08 – 2024-02-09 (×2): 50 mg via ORAL
  Filled 2024-02-04 (×2): qty 1

## 2024-02-04 MED ORDER — AZITHROMYCIN 500 MG PO TABS
500.0000 mg | ORAL_TABLET | Freq: Every day | ORAL | Status: DC
  Administered 2024-02-04 – 2024-02-10 (×7): 500 mg via ORAL
  Filled 2024-02-04: qty 2
  Filled 2024-02-04 (×6): qty 1

## 2024-02-04 MED ORDER — SODIUM CHLORIDE 0.9% FLUSH
3.0000 mL | Freq: Two times a day (BID) | INTRAVENOUS | Status: DC
  Administered 2024-02-04 – 2024-02-05 (×3): 3 mL via INTRAVENOUS

## 2024-02-04 MED ORDER — IPRATROPIUM-ALBUTEROL 0.5-2.5 (3) MG/3ML IN SOLN
3.0000 mL | Freq: Once | RESPIRATORY_TRACT | Status: AC
  Administered 2024-02-04: 3 mL via RESPIRATORY_TRACT
  Filled 2024-02-04: qty 3

## 2024-02-04 MED ORDER — ASPIRIN 81 MG PO TBEC
81.0000 mg | DELAYED_RELEASE_TABLET | Freq: Every day | ORAL | Status: DC
  Administered 2024-02-05 – 2024-02-10 (×6): 81 mg via ORAL
  Filled 2024-02-04 (×6): qty 1

## 2024-02-04 MED ORDER — FENTANYL CITRATE PF 50 MCG/ML IJ SOSY
25.0000 ug | PREFILLED_SYRINGE | INTRAMUSCULAR | Status: DC | PRN
  Administered 2024-02-09: 25 ug via INTRAVENOUS
  Filled 2024-02-04: qty 1

## 2024-02-04 MED ORDER — POLYETHYLENE GLYCOL 3350 17 G PO PACK
17.0000 g | PACK | Freq: Every day | ORAL | Status: DC | PRN
  Administered 2024-02-07: 17 g via ORAL
  Filled 2024-02-04: qty 1

## 2024-02-04 MED ORDER — TAMSULOSIN HCL 0.4 MG PO CAPS
0.4000 mg | ORAL_CAPSULE | Freq: Every day | ORAL | Status: DC
  Administered 2024-02-04 – 2024-02-10 (×7): 0.4 mg via ORAL
  Filled 2024-02-04 (×7): qty 1

## 2024-02-04 MED ORDER — INFLUENZA VAC A&B SURF ANT ADJ 0.5 ML IM SUSY
0.5000 mL | PREFILLED_SYRINGE | INTRAMUSCULAR | Status: DC
  Filled 2024-02-04: qty 0.5

## 2024-02-04 MED ORDER — BUDESON-GLYCOPYRROL-FORMOTEROL 160-9-4.8 MCG/ACT IN AERO
2.0000 | INHALATION_SPRAY | Freq: Two times a day (BID) | RESPIRATORY_TRACT | Status: DC
  Administered 2024-02-04 – 2024-02-10 (×11): 2 via RESPIRATORY_TRACT
  Filled 2024-02-04 (×2): qty 5.9

## 2024-02-04 MED ORDER — ALBUTEROL SULFATE HFA 108 (90 BASE) MCG/ACT IN AERS: 2.0000 | INHALATION_SPRAY | RESPIRATORY_TRACT | Status: DC | PRN

## 2024-02-04 MED ORDER — METHOCARBAMOL 500 MG PO TABS
500.0000 mg | ORAL_TABLET | Freq: Three times a day (TID) | ORAL | Status: DC | PRN
Start: 2024-02-04 — End: 2024-02-10
  Administered 2024-02-04 – 2024-02-07 (×3): 500 mg via ORAL
  Filled 2024-02-04 (×3): qty 1

## 2024-02-04 MED ORDER — OXYCODONE HCL 5 MG PO TABS
5.0000 mg | ORAL_TABLET | Freq: Three times a day (TID) | ORAL | Status: DC | PRN
  Administered 2024-02-05: 5 mg via ORAL
  Filled 2024-02-04: qty 1

## 2024-02-04 MED ORDER — DM-GUAIFENESIN ER 30-600 MG PO TB12
1.0000 | ORAL_TABLET | Freq: Two times a day (BID) | ORAL | Status: DC
  Administered 2024-02-04 – 2024-02-10 (×13): 1 via ORAL
  Filled 2024-02-04 (×13): qty 1

## 2024-02-04 MED ORDER — SERTRALINE HCL 50 MG PO TABS
25.0000 mg | ORAL_TABLET | Freq: Every day | ORAL | Status: DC
  Administered 2024-02-04: 25 mg via ORAL
  Filled 2024-02-04: qty 1
  Filled 2024-02-04: qty 0.5

## 2024-02-04 MED ORDER — SODIUM CHLORIDE 0.9% FLUSH
3.0000 mL | INTRAVENOUS | Status: DC | PRN
  Administered 2024-02-04: 3 mL via INTRAVENOUS

## 2024-02-04 MED ORDER — MOMETASONE FURO-FORMOTEROL FUM 200-5 MCG/ACT IN AERO: 2.0000 | INHALATION_SPRAY | Freq: Two times a day (BID) | RESPIRATORY_TRACT | Status: DC

## 2024-02-04 MED ORDER — MAGNESIUM SULFATE 2 GM/50ML IV SOLN
2.0000 g | Freq: Once | INTRAVENOUS | Status: AC
  Administered 2024-02-04: 2 g via INTRAVENOUS
  Filled 2024-02-04: qty 50

## 2024-02-04 MED ORDER — SODIUM CHLORIDE 0.9% FLUSH
10.0000 mL | Freq: Three times a day (TID) | INTRAVENOUS | Status: DC
  Administered 2024-02-04 – 2024-02-06 (×5): 10 mL via INTRAPLEURAL

## 2024-02-04 MED ORDER — CHLORHEXIDINE GLUCONATE CLOTH 2 % EX PADS
6.0000 | MEDICATED_PAD | Freq: Every day | CUTANEOUS | Status: AC
  Administered 2024-02-04 – 2024-02-08 (×5): 6 via TOPICAL

## 2024-02-04 MED ORDER — KETOROLAC TROMETHAMINE 15 MG/ML IJ SOLN
15.0000 mg | Freq: Three times a day (TID) | INTRAMUSCULAR | Status: AC | PRN
  Administered 2024-02-04 – 2024-02-05 (×2): 15 mg via INTRAVENOUS
  Filled 2024-02-04 (×2): qty 1

## 2024-02-04 MED ORDER — IRBESARTAN 150 MG PO TABS
75.0000 mg | ORAL_TABLET | Freq: Every day | ORAL | Status: DC
  Administered 2024-02-04 – 2024-02-10 (×7): 75 mg via ORAL
  Filled 2024-02-04 (×8): qty 1

## 2024-02-04 MED ORDER — GABAPENTIN 300 MG PO CAPS
300.0000 mg | ORAL_CAPSULE | Freq: Two times a day (BID) | ORAL | Status: DC
  Administered 2024-02-04 – 2024-02-10 (×11): 300 mg via ORAL
  Filled 2024-02-04 (×11): qty 1

## 2024-02-04 MED ORDER — ONDANSETRON HCL 4 MG/2ML IJ SOLN: 4.0000 mg | Freq: Four times a day (QID) | INTRAMUSCULAR | Status: DC | PRN

## 2024-02-04 MED ORDER — SODIUM CHLORIDE 0.9% FLUSH
3.0000 mL | Freq: Two times a day (BID) | INTRAVENOUS | Status: DC
  Administered 2024-02-04 – 2024-02-10 (×12): 3 mL via INTRAVENOUS

## 2024-02-04 MED ORDER — ALBUTEROL SULFATE (2.5 MG/3ML) 0.083% IN NEBU
2.5000 mg | INHALATION_SOLUTION | RESPIRATORY_TRACT | Status: DC | PRN
  Administered 2024-02-05 – 2024-02-08 (×3): 2.5 mg via RESPIRATORY_TRACT
  Filled 2024-02-04 (×3): qty 3

## 2024-02-04 MED ORDER — IPRATROPIUM-ALBUTEROL 0.5-2.5 (3) MG/3ML IN SOLN
3.0000 mL | Freq: Four times a day (QID) | RESPIRATORY_TRACT | Status: DC
  Administered 2024-02-04: 3 mL via RESPIRATORY_TRACT
  Filled 2024-02-04: qty 3

## 2024-02-04 MED ORDER — SODIUM CHLORIDE 0.9 % IV SOLN: INTRAVENOUS | Status: AC | PRN

## 2024-02-04 MED ORDER — MESALAMINE 1.2 G PO TBEC
4.8000 g | DELAYED_RELEASE_TABLET | Freq: Every day | ORAL | Status: DC
  Administered 2024-02-05 – 2024-02-10 (×5): 4.8 g via ORAL
  Filled 2024-02-04 (×6): qty 4

## 2024-02-04 MED ORDER — BISACODYL 10 MG RE SUPP: 10.0000 mg | Freq: Every day | RECTAL | Status: DC | PRN

## 2024-02-04 MED ORDER — HEPARIN SODIUM (PORCINE) 5000 UNIT/ML IJ SOLN
5000.0000 [IU] | Freq: Three times a day (TID) | INTRAMUSCULAR | Status: DC
  Administered 2024-02-04 – 2024-02-10 (×17): 5000 [IU] via SUBCUTANEOUS
  Filled 2024-02-04 (×17): qty 1

## 2024-02-04 MED ORDER — OXYCODONE HCL 5 MG PO TABS
5.0000 mg | ORAL_TABLET | Freq: Three times a day (TID) | ORAL | Status: DC | PRN
  Administered 2024-02-04: 5 mg via ORAL
  Filled 2024-02-04: qty 1

## 2024-02-04 MED ORDER — LUBIPROSTONE 24 MCG PO CAPS
24.0000 ug | ORAL_CAPSULE | Freq: Every day | ORAL | Status: DC
  Administered 2024-02-05 – 2024-02-10 (×6): 24 ug via ORAL
  Filled 2024-02-04 (×6): qty 1

## 2024-02-04 NOTE — ED Provider Notes (Signed)
 [place] EMERGENCY DEPARTMENT AT [facility] Provider Note   CSN: [id number] Arrival date & time: 02/04/24  0102     History {Add pertinent medical, surgical, social history, OB history to HPI:1} Chief Complaint  Patient presents with   Shortness of Breath    Michael Ellison is a 78 y.o. male.  Patient has a history of COPD.  He presents with shortness of breath.   Shortness of Breath      Home Medications Prior to Admission medications   Medication Sig Start Date End Date Taking? Authorizing Provider  acetaminophen  (TYLENOL ) 500 MG tablet Take 500 mg by mouth every 6 (six) hours as needed for headache.     [provider]  aspirin  EC 81 MG tablet Take 81 mg by mouth as needed. Takes 1 to 2 a month. Patient not taking: Reported on 09/14/2023    [provider]  bisoprolol  (ZEBETA ) 5 MG tablet Take 0.5 tablets (2.5 mg total) by mouth daily after supper. 06/28/23   [name], MD  furosemide  (LASIX ) 40 MG tablet Take 40 mg by mouth daily. 06/08/23   [provider]  gabapentin  (NEURONTIN ) 300 MG capsule Take 300 mg by mouth as needed.    [provider]  GAVILYTE-G 236 g solution SMARTSIG:Milliliter(s) By Mouth 07/07/23   [provider]  ipratropium-albuterol  (DUONEB) 0.5-2.5 (3) MG/3ML SOLN Take 3 mLs by nebulization every 6 (six) hours as needed (for shortness of breath/wheezing).     [provider]  lubiprostone  (AMITIZA ) 24 MCG capsule Take one capsule once to twice daily for constipation. 04/29/23   [name], PA-C  meclizine (ANTIVERT) 25 MG tablet Take 25 mg by mouth 3 (three) times daily. 05/04/23   [provider]  meloxicam (MOBIC) 7.5 MG tablet SMARTSIG:1 Tablet(s) By Mouth Morning-Evening 04/04/23   [provider]  mesalamine  (LIALDA ) 1.2 g EC tablet Take 4 tablets (4.8 g total) by mouth daily with breakfast. 08/25/23   Rourk, Windsor Hatcher, MD  olmesartan  (BENICAR ) 20 MG tablet  Take 1 tablet (20 mg total) by mouth daily. 11/28/23   Diamond Formica, MD  potassium chloride  SA (KLOR-CON  M) 20 MEQ tablet Take 20 mEq by mouth daily. 06/08/23   [provider]  sertraline  (ZOLOFT ) 25 MG tablet Take 25 mg by mouth daily. 04/04/23   [provider]  SYMBICORT  160-4.5 MCG/ACT inhaler Inhale 1 puff into the lungs 2 (two) times daily. 04/04/23   [provider]  tamsulosin  (FLOMAX ) 0.4 MG CAPS capsule Take 1 capsule by mouth daily. 07/05/18   [provider]      Allergies    Lorazepam     Review of Systems   Review of Systems  Respiratory:  Positive for shortness of breath.     Physical Exam Updated Vital Signs BP (!) 140/88   Pulse 83   Temp 97.7 F (36.5 C) (Oral)   Resp (!) 21   Ht 6' 2.5" (1.892 m)   Wt 95.3 kg   SpO2 93%   BMI 26.60 kg/m  Physical Exam  ED Results / Procedures / Treatments   Labs (all labs ordered are listed, but only abnormal results are displayed) Labs Reviewed  BASIC METABOLIC PANEL WITH GFR - Abnormal; Notable for the following components:      Result Value   Glucose, Bld 103 (*)    All other components within normal limits  BLOOD GAS, VENOUS - Abnormal; Notable for the following components:  pO2, Ven <31 (*)    Bicarbonate 29.0 (*)    All other components within normal limits  HEPATIC FUNCTION PANEL - Abnormal; Notable for the following components:   Total Protein 8.3 (*)    All other components within normal limits  BRAIN NATRIURETIC PEPTIDE - Abnormal; Notable for the following components:   B Natriuretic Peptide 123.0 (*)    All other components within normal limits  CBC WITH DIFFERENTIAL/PLATELET - Abnormal; Notable for the following components:   RBC 6.08 (*)    RDW 17.2 (*)    All other components within normal limits  RESP PANEL BY RT-PCR (RSV, FLU A&B, COVID)  [id number]    EKG EKG Interpretation Date/Time:  Saturday February 04 2024 07:01:50 EDT Ventricular Rate:  85 PR  Interval:  219 QRS Duration:  95 QT Interval:  353 QTC Calculation: 420 R Axis:   76  Text Interpretation: Sinus rhythm Borderline prolonged PR interval Minimal ST elevation, inferior leads Confirmed by Cheyenne Cotta [phone]) on 02/04/2024 7:56:23 AM  Radiology DG Chest Portable 1 View Result Date: 02/04/2024 CLINICAL DATA:  78 year old male chest tube placement for left pneumothorax. EXAM: PORTABLE CHEST 1 VIEW COMPARISON:  Portable chest 0719 hours today.  Chest CT 04/25/2023. FINDINGS: Portable AP semi upright view at 0830 hours. Bullous emphysema demonstrated by CT last year. Pigtail left chest tube has been placed at the area of previous moderate to large left pneumothorax. A small volume of pleural air persists visible both along the medial left lung and at the left costophrenic angle. Moderately good left lung re-expansion. No mediastinal shift. Stable mediastinal contours. Stable right lung. No acute osseous abnormality identified. Negative visible bowel gas. IMPRESSION: Left chest tube placed with small volume residual left pneumothorax. Underlying Bullous Emphysema (ICD10-J43.9). Electronically Signed   By: Marlise Simpers M.D.   On: 02/04/2024 08:37   DG Chest Port 1 View Result Date: 02/04/2024 CLINICAL DATA:  Shortness of breath onset yesterday.  Chest pain. EXAM: PORTABLE CHEST 1 VIEW COMPARISON:  PA and lateral chest 06/09/2023 FINDINGS: Left lung has partially collapsed medially with pneumothorax estimated about 60% of the left chest volume with contralateral mediastinal shift consistent with tension. Emphysematous and coarse chronic interstitial changes are again noted. There is mild biapical pleuroparenchymal scar-like opacity. There is compressive atelectasis in the left lung, no focal infiltrate on the right. There is mild cardiomegaly. No vascular congestion is seen. Mediastinum is normally outlined. There is calcification in the transverse aorta. Mild thoracic dextroscoliosis with no new  osseous finding. Osteopenia. IMPRESSION: 1. Left pneumothorax estimated about 60% of the left chest volume with contralateral mediastinal shift consistent with tension. 2. Emphysematous and chronic changes. 3. Mild cardiomegaly. 4. Aortic atherosclerosis. 5. Critical Value/emergent results were called by telephone at the time of interpretation on 02/04/2024 at 7:40 am to provider [facility] Postlewaite , who verbally acknowledged these results. Electronically Signed   By: Denman Fischer M.D.   On: 02/04/2024 07:40    Procedures Procedures  {Document cardiac monitor, telemetry assessment procedure when appropriate:1}  Medications Ordered in ED Medications  ipratropium-albuterol  (DUONEB) 0.5-2.5 (3) MG/3ML nebulizer solution 3 mL (has no administration in time range)  albuterol  (PROVENTIL ) (2.5 MG/3ML) 0.083% nebulizer solution 2.5 mg (has no administration in time range)  dextromethorphan -guaiFENesin  (MUCINEX  DM) 30-600 MG per 12 hr tablet 1 tablet (has no administration in time range)  azithromycin  (ZITHROMAX ) tablet 500 mg (has no administration in time range)  aspirin  EC tablet 81 mg (has no administration in time  range)  bisoprolol  (ZEBETA ) tablet 2.5 mg (has no administration in time range)  irbesartan  (AVAPRO ) tablet 75 mg (has no administration in time range)  sertraline  (ZOLOFT ) tablet 25 mg (has no administration in time range)  lubiprostone  (AMITIZA ) capsule 24 mcg (has no administration in time range)  mesalamine  (LIALDA ) EC tablet 4.8 g (has no administration in time range)  tamsulosin  (FLOMAX ) capsule 0.4 mg (has no administration in time range)  gabapentin  (NEURONTIN ) capsule 300 mg (has no administration in time range)  mometasone -formoterol  (DULERA ) 200-5 MCG/ACT inhaler 2 puff (has no administration in time range)  sodium chloride  flush (NS) 0.9 % injection 3 mL (has no administration in time range)  sodium chloride  flush (NS) 0.9 % injection 3 mL (has no administration in time range)   sodium chloride  flush (NS) 0.9 % injection 3 mL (has no administration in time range)  0.9 %  sodium chloride  infusion (has no administration in time range)  acetaminophen  (TYLENOL ) tablet 650 mg (has no administration in time range)    Or  acetaminophen  (TYLENOL ) suppository 650 mg (has no administration in time range)  traZODone  (DESYREL ) tablet 50 mg (has no administration in time range)  polyethylene glycol (MIRALAX  / GLYCOLAX ) packet 17 g (has no administration in time range)  bisacodyl  (DULCOLAX) suppository 10 mg (has no administration in time range)  ondansetron  (ZOFRAN ) tablet 4 mg (has no administration in time range)    Or  ondansetron  (ZOFRAN ) injection 4 mg (has no administration in time range)  heparin  injection 5,000 Units (has no administration in time range)  fentaNYL  (SUBLIMAZE ) injection 25 mcg (has no administration in time range)  oxyCODONE  (Oxy IR/ROXICODONE ) immediate release tablet 5 mg (has no administration in time range)  methylPREDNISolone  sodium succinate  (SOLU-MEDROL ) 125 mg/2 mL injection 125 mg (125 mg Intravenous Given 02/04/24 0720)  ipratropium-albuterol  (DUONEB) 0.5-2.5 (3) MG/3ML nebulizer solution 3 mL (3 mLs Nebulization Given 02/04/24 0718)  albuterol  (PROVENTIL ) (2.5 MG/3ML) 0.083% nebulizer solution 2.5 mg (2.5 mg Nebulization Given 02/04/24 0718)  magnesium  sulfate IVPB 2 g 50 mL (0 g Intravenous Stopped 02/04/24 0830)    ED Course/ Medical Decision Making/ A&P  Patient with shortness of breath.  Chest x-ray shows tension pneumothorax.  Chest tube was placed by general surgeon Dr. Larrie Po.  Family request the patient be admitted to [facility].  I spoke with critical care and they recommended that the patient be admitted to [facility] by the hospitalist to a MedSurg bed and critical care will consult {   Click here for ABCD2, HEART and other calculatorsREFRESH Note before signing :1}                              Medical Decision Making Amount  and/or Complexity of Data Reviewed Labs: ordered. Radiology: ordered.  Risk Prescription drug management. Decision regarding hospitalization.   Tension pneumothorax which was relieved by [facility] catheter.  Patient [facility] to [facility]  {Document critical care time when appropriate:1} {Document review of labs and clinical decision tools ie heart score, Chads2Vasc2 etc:1}  {Document your independent review of radiology images, and any outside records:1} {Document your discussion with family members, caretakers, and with consultants:1} {Document social determinants of health affecting pt's care:1} {Document your decision making why or why not admission, treatments were needed:1} Final Clinical Impression(s) / ED Diagnoses Final diagnoses:  Tension pneumothorax    Rx / DC Orders ED Discharge Orders  None

## 2024-02-04 NOTE — H&P (Addendum)
 History and Physical   Patient: Michael Ellison ZOX:[id number] DOB: Nov 27, 1945 DOA: 02/04/2024 DOS: the patient was seen and examined on 02/04/2024 PCP: Wyvonna Heidelberg, MD  Patient coming from: Home  Chief Complaint:  Chief Complaint  Patient presents with   Shortness of Breath   HPI: Michael Ellison is a 78 y.o. male who is a reformed smoker and reformed alcoholic with medical history significant for COPD with chronic hypoxic respiratory failure using oxygen  at 2 to 3 L as needed, HTN, GERD , PSVT presents to the ED with complaints of dyspnea and left-sided chest discomfort since 02/03/2024 No fever  Or chills  - No productive cough - No dizziness no palpitations, no leg pains or leg swelling,  no pleuritic symptoms -- Additional history obtained from patient's daughters x 2 at bedside - Prior to admission patient used oxygen  at home as needed at 2 to 3 L, currently requiring 4 L per nasal cannula after chest tube placement In ED--COVID, flu and RSV negative - CBC WNL - LFTs WNL - Creatinine 1.0 BUN 15 bicarb 24 anion gap 8 sodium 138 potassium 4.4 BNP 123, EKG sinus rhythm with nonspecific ST changes -- Initial chest x-ray with 50% left-sided pneumothorax with contralateral mediastinal shift consistent with tension in the background of Emphysematous changes - Repeat chest x-ray after chest tube placement shows improved pneumothorax with residual small volume left-sided pneumothorax, underlying bullous emphysema noted  Review of Systems: As mentioned in the history of present illness. All other systems reviewed and are negative. Past Medical History:  Diagnosis Date   Arthritis    Aspiration pneumonia (HCC) 03/01/2012   Cancer of prostate (HCC)    COPD (chronic obstructive pulmonary disease) (HCC)    Depression    Dyspnea    GERD (gastroesophageal reflux disease)    Heart murmur    Hernia of abdominal cavity    Hypertension    Leaky heart valve    On home O2 PRN-does  not use all the time.    3L prn   Past Surgical History:  Procedure Laterality Date   BIOPSY  08/17/2023   Procedure: BIOPSY;  Surgeon: [name], MD;  [facility];  Service: Endoscopy;;   BOWEL RESECTION  02/25/2012   Procedure: SMALL BOWEL RESECTION;  Surgeon: Lovena Rubinstein, MD;  Location: [facility];  Service: General;;   COLONOSCOPY N/A 05/27/2016   Dr.Rourk: 14 mm polyp in the sigmoid colon removed, tubular adenoma.  8 mm polyp in the descending colon removed, tubular adenoma.  Diverticulosis.  Recommend colonoscopy 3 years   COLONOSCOPY WITH PROPOFOL  N/A 09/27/2019   Procedure: COLONOSCOPY WITH PROPOFOL ;  Surgeon: [name], MD;  [facility]: [facility];  Service: Endoscopy;  Laterality: N/A;  12:30pm   COLONOSCOPY WITH PROPOFOL  N/A 08/17/2023   Procedure: COLONOSCOPY WITH PROPOFOL ;  Surgeon: [name], MD;  [facility]: [facility];  Service: Endoscopy;  Laterality: N/A;  2:15 pm, asa 3   HERNIA REPAIR     INCISION AND DRAINAGE ABSCESS N/A 01/15/2013   Procedure: INCISION AND DRAINAGE Abdominal Wall ABSCESS;  Surgeon: Diantha Fossa, MD;  Location: [facility];  Service: General;  Laterality: N/A;   INGUINAL HERNIA REPAIR Right 09/16/2017   Procedure: RIGHT INGUINAL HERNIORRHAPHY  WITH MESH;  Surgeon: Alanda Allegra, MD;  Location: [facility];  Service: General;  Laterality: Right;   INSERTION OF MESH N/A 12/25/2012   Procedure: INSERTION OF MESH;  Surgeon: Diantha Fossa, MD;  Location: [facility];  Service: General;  Laterality: N/A;   INSERTION OF MESH N/A 08/26/2016   Procedure: INSERTION OF MESH;  Surgeon: Jerryl Morin, MD;  Location: [facility];  Service: General;  Laterality: N/A;   LAPAROTOMY  02/25/2012   Procedure: EXPLORATORY LAPAROTOMY;  Surgeon: Lovena Rubinstein, MD;  Location: [facility];  Service: General;  Laterality: N/A;   POLYPECTOMY  05/27/2016   Procedure: POLYPECTOMY;  Surgeon: [name], MD;  [facility];  Service: Endoscopy;;  Descending colon polyp  and Sigmoid colon polyp removed via hot snare   POLYPECTOMY  09/27/2019   Procedure: POLYPECTOMY;  Surgeon: [name], MD;  [facility]: [facility];  Service: Endoscopy;;   PROSTATE BIOPSY     robotic prostate surgery  2011   TRACHEOSTOMY  03/2012   VENTRAL HERNIA REPAIR  12/25/2012    Dr Mammie Sears   VENTRAL HERNIA REPAIR N/A 12/25/2012   Procedure: HERNIA REPAIR VENTRAL ADULT;  Surgeon: Diantha Fossa, MD;  Location: [facility] OR;  Service: General;  Laterality: N/A;   VENTRAL HERNIA REPAIR N/A 08/26/2016   Procedure: VENTRAL INCISIONAL HERNIA REPAIR WITH MESH;  Surgeon: Jerryl Morin, MD;  Location: [facility] OR;  Service: General;  Laterality: N/A;   WOUND DEBRIDEMENT  03/02/2012   Procedure: DEBRIDEMENT CLOSURE/ABDOMINAL WOUND;  Surgeon: Diantha Fossa, MD;  Location: [facility];  Service: General;  Laterality: N/A;   ZENKER'S DIVERTICULECTOMY  2013   Dr. Tellis Feathers   Social History:  reports that he quit smoking about 11 years ago. His smoking use included cigarettes. He started smoking about 55 years ago. He has a 66 pack-year smoking history. He has never used smokeless tobacco. He reports that he does not drink alcohol and does not use drugs.  Allergies  Allergen Reactions   Lorazepam  Other (See Comments)    SEVERE DELERIUM AGITATION Tolerates midazolam  and other benzo's    Family History  Problem Relation Age of Onset   Hypertension Mother    Pseudochol deficiency Neg Hx    Anesthesia problems Neg Hx    Hypotension Neg Hx    Malignant hyperthermia Neg Hx    Colon cancer Neg Hx     Prior to Admission medications   Medication Sig Start Date End Date Taking? Authorizing Provider  acetaminophen  (TYLENOL ) 500 MG tablet Take 500 mg by mouth every 6 (six) hours as needed for headache.     [provider]  aspirin  EC 81 MG tablet Take 81 mg by mouth as needed. Takes 1 to 2 a month. Patient not taking: Reported on 09/14/2023    [provider]  bisoprolol  (ZEBETA ) 5 MG tablet Take 0.5  tablets (2.5 mg total) by mouth daily after supper. 06/28/23   [name], MD  furosemide  (LASIX ) 40 MG tablet Take 40 mg by mouth daily. 06/08/23   [provider]  gabapentin  (NEURONTIN ) 300 MG capsule Take 300 mg by mouth as needed.    [provider]  GAVILYTE-G 236 g solution SMARTSIG:Milliliter(s) By Mouth 07/07/23   [provider]  ipratropium-albuterol  (DUONEB) 0.5-2.5 (3) MG/3ML SOLN Take 3 mLs by nebulization every 6 (six) hours as needed (for shortness of breath/wheezing).     [provider]  lubiprostone  (AMITIZA ) 24 MCG capsule Take one capsule once to twice daily for constipation. 04/29/23   [name], PA-C  meclizine (ANTIVERT) 25 MG tablet Take 25 mg by mouth 3 (three) times daily. 05/04/23   [provider]  meloxicam (  MOBIC) 7.5 MG tablet SMARTSIG:1 Tablet(s) By Mouth Morning-Evening 04/04/23   [provider]  mesalamine  (LIALDA ) 1.2 g EC tablet Take 4 tablets (4.8 g total) by mouth daily with breakfast. 08/25/23   Rourk, Windsor Hatcher, MD  olmesartan  (BENICAR ) 20 MG tablet Take 1 tablet (20 mg total) by mouth daily. 11/28/23   Diamond Formica, MD  potassium chloride  SA (KLOR-CON  M) 20 MEQ tablet Take 20 mEq by mouth daily. 06/08/23   [provider]  sertraline  (ZOLOFT ) 25 MG tablet Take 25 mg by mouth daily. 04/04/23   [provider]  SYMBICORT  160-4.5 MCG/ACT inhaler Inhale 1 puff into the lungs 2 (two) times daily. 04/04/23   [provider]  tamsulosin  (FLOMAX ) 0.4 MG CAPS capsule Take 1 capsule by mouth daily. 07/05/18   [provider]    Physical Exam: Vitals:   02/04/24 1104 02/04/24 1104 02/04/24 1110 02/04/24 1145  BP:    (!) 171/92  Pulse:    95  Resp:    (!) 23  Temp: 97.9 F (36.6 C)     TempSrc: Oral     SpO2:  94% 95% 94%  Weight:      Height:        Physical Exam Gen:- Awake Alert, in no acute distress , no conversational dyspnea HEENT:- [place].AT, No sclera  icterus Nose- [place] 4L/min Neck-Supple Neck,No JVD,.  Lungs-diminished breath sounds on the left, left-sided chest tube in situ CV- S1, S2 normal, RRR Abd-  +ve B.Sounds, Abd Soft, No tenderness,    Extremity/Skin:- No  edema,   good pedal pulses  Psych-affect is appropriate, oriented x3 Neuro-no new focal deficits, no tremors  Data Reviewed: COVID, flu and RSV negative - CBC WNL - LFTs WNL - Creatinine 1.0 BUN 15 bicarb 24 anion gap 8 sodium 138 potassium 4.4 BNP 123, EKG sinus rhythm with nonspecific ST changes -- Initial chest x-ray with 50% left-sided pneumothorax with contralateral mediastinal shift consistent with tension in the background of Emphysematous changes - Repeat chest x-ray after chest tube placement shows improved pneumothorax with residual small volume left-sided pneumothorax, underlying bullous emphysema noted  Assessment and Plan: 1)Acute Left-sided Tension Pneumothorax--- Initial chest x-ray with 50% left-sided pneumothorax with contralateral mediastinal shift consistent with tension in the background of Bullous Emphysematous changes - Repeat chest x-ray after chest tube placement shows improved pneumothorax with residual small volume left-sided pneumothorax, underlying bullous emphysema noted - General Surgery consult for chest tube placement appreciated - EDP discussed case with on-call PCCM attending Dr. Ardelle Kos who recommends transfer to [name] service with PCCM managing chest tube Prior to admission patient used oxygen  at home as needed at 2 to 3 L, currently requiring 4 L per nasal cannula after chest tube placement In ED--COVID, flu and RSV negative - CBC WNL -EKG sinus rhythm with nonspecific ST changes --  2)COPD --predominantly Bullous Emphysema, No acute COPD exacer -- Continue bronchodilators  3) acute on chronic hypoxic respiratory failure--due to #1 above Prior to admission patient used oxygen  at home as needed at 2 to 3 L, currently  requiring 4 L per nasal cannula after chest tube placement - Try to wean off oxygen  as able  4)HTN/pSVT--continue bisoprolol , continue Avapro   5)Depression/anxiety--May use trazodone  as needed at at bedtime  6)BPH--- continue Flomax    Advance Care Planning:   Code Status: Full Code   Consults: General Surgery and PCCM consult  Family Communication: Discussed with daughters x 2 at bedside  Severity  of Illness: The appropriate patient status for this patient is INPATIENT. Inpatient status is judged to be reasonable and necessary in order to provide the required intensity of service to ensure the patient's safety. The patient's presenting symptoms, physical exam findings, and initial radiographic and laboratory data in the context of their chronic comorbidities is felt to place them at high risk for further clinical deterioration. Furthermore, it is not anticipated that the patient will be medically stable for discharge from the hospital within 2 midnights of admission.   * I certify that at the point of admission it is my clinical judgment that the patient will require inpatient hospital care spanning beyond 2 midnights from the point of admission due to high intensity of service, high risk for further deterioration and high frequency of surveillance required.*  Author: Colin Dawley, MD 02/04/2024 1:35 PM  For on call review [email or web address].

## 2024-02-04 NOTE — ED Provider Notes (Signed)
 [place] [facility]ER[facility] DEPARTMENT AT [facility] Provider Note   CSN: [id number] Arrival date & time: 02/04/24  2130     History {Add pertinent medical, surgical, social history, OB history to HPI:1} Chief Complaint  Patient presents with   Shortness of Breath    Michael Ellison is a 78 y.o. male.  Patient complains of shortness of breath   Shortness of Breath      Home Medications Prior to Admission medications   Medication Sig Start Date End Date Taking? Authorizing Provider  acetaminophen  (TYLENOL ) 500 MG tablet Take 500 mg by mouth every 6 (six) hours as needed for headache.     [provider]  aspirin  EC 81 MG tablet Take 81 mg by mouth as needed. Takes 1 to 2 a month. Patient not taking: Reported on 09/14/2023    [provider]  bisoprolol  (ZEBETA ) 5 MG tablet Take 0.5 tablets (2.5 mg total) by mouth daily after supper. 06/28/23   [name], MD  furosemide  (LASIX ) 40 MG tablet Take 40 mg by mouth daily. 06/08/23   [provider]  gabapentin  (NEURONTIN ) 300 MG capsule Take 300 mg by mouth as needed.    [provider]  GAVILYTE-G 236 g solution SMARTSIG:Milliliter(s) By Mouth 07/07/23   [provider]  ipratropium-albuterol  (DUONEB) 0.5-2.5 (3) MG/3ML SOLN Take 3 mLs by nebulization every 6 (six) hours as needed (for shortness of breath/wheezing).     [provider]  lubiprostone  (AMITIZA ) 24 MCG capsule Take one capsule once to twice daily for constipation. 04/29/23   [name], PA-C  meclizine (ANTIVERT) 25 MG tablet Take 25 mg by mouth 3 (three) times daily. 05/04/23   [provider]  meloxicam (MOBIC) 7.5 MG tablet SMARTSIG:1 Tablet(s) By Mouth Morning-Evening 04/04/23   [provider]  mesalamine  (LIALDA ) 1.2 g EC tablet Take 4 tablets (4.8 g total) by mouth daily with breakfast. 08/25/23   Rourk, Windsor Hatcher, MD  olmesartan  (BENICAR ) 20 MG tablet Take 1 tablet (20 mg total)  by mouth daily. 11/28/23   Diamond Formica, MD  potassium chloride  SA (KLOR-CON  M) 20 MEQ tablet Take 20 mEq by mouth daily. 06/08/23   [provider]  sertraline  (ZOLOFT ) 25 MG tablet Take 25 mg by mouth daily. 04/04/23   [provider]  SYMBICORT  160-4.5 MCG/ACT inhaler Inhale 1 puff into the lungs 2 (two) times daily. 04/04/23   [provider]  tamsulosin  (FLOMAX ) 0.4 MG CAPS capsule Take 1 capsule by mouth daily. 07/05/18   [provider]      Allergies    Lorazepam     Review of Systems   Review of Systems  Respiratory:  Positive for shortness of breath.     Physical Exam Updated Vital Signs BP (!) 156/95 (BP Location: Left Arm)   Pulse 84   Temp 97.7 F (36.5 C) (Oral)   Resp (!) 25   Ht 6' 2.5" (1.892 m)   Wt 95.3 kg   SpO2 (!) 2%   BMI 26.60 kg/m  Physical Exam  ED Results / Procedures / Treatments   Labs (all labs ordered are listed, but only abnormal results are displayed) Labs Reviewed  BLOOD GAS, VENOUS - Abnormal; Notable for the following components:      Result Value   pO2, Ven <31 (*)    Bicarbonate 29.0 (*)    All other components within normal limits  CBC WITH DIFFERENTIAL/PLATELET - Abnormal; Notable for the following components:  RBC 6.08 (*)    RDW 17.2 (*)    All other components within normal limits  RESP PANEL BY RT-PCR (RSV, FLU A&B, COVID)  RVPGX2  BASIC METABOLIC PANEL WITH GFR  HEPATIC FUNCTION PANEL  BRAIN NATRIURETIC PEPTIDE    EKG None  Radiology No results found.  Procedures Procedures  {Document cardiac monitor, telemetry assessment procedure when appropriate:1}  Medications Ordered in ED Medications  magnesium  sulfate IVPB 2 g 50 mL (2 g Intravenous New Bag/Given 02/04/24 0723)  methylPREDNISolone  sodium succinate  (SOLU-MEDROL ) 125 mg/2 mL injection 125 mg (125 mg Intravenous Given 02/04/24 0720)  ipratropium-albuterol  (DUONEB) 0.5-2.5 (3) MG/3ML nebulizer solution 3 mL (3 mLs Nebulization  Given 02/04/24 0718)  albuterol  (PROVENTIL ) (2.5 MG/3ML) 0.083% nebulizer solution 2.5 mg (2.5 mg Nebulization Given 02/04/24 1610)    ED Course/ Medical Decision Making/ A&P   {   Click here for ABCD2, HEART and other calculatorsREFRESH Note before signing :1}                              Medical Decision Making Amount and/or Complexity of Data Reviewed Labs: ordered. Radiology: ordered.  Risk Prescription drug management.   ***  {Document critical care time when appropriate:1} {Document review of labs and clinical decision tools ie heart score, Chads2Vasc2 etc:1}  {Document your independent review of radiology images, and any outside records:1} {Document your discussion with family members, caretakers, and with consultants:1} {Document social determinants of health affecting pt's care:1} {Document your decision making why or why not admission, treatments were needed:1} Final Clinical Impression(s) / ED Diagnoses Final diagnoses:  None    Rx / DC Orders ED Discharge Orders     None

## 2024-02-04 NOTE — ED Notes (Signed)
 RT called

## 2024-02-04 NOTE — Consult Note (Signed)
 NAME:  Michael Ellison, MRN:  [id number], DOB:  Jul 22, 1946, LOS: 0 ADMISSION DATE:  02/04/2024, CONSULTATION DATE:  02/04/24 REFERRING MD:  Dr. Bryna Car, CHIEF COMPLAINT: SOB   History of Present Illness:   47 yoM with PMH as below significant for former smoker, COPD/ bullous emphysema, chronic hypoxic respiratory failure on prn home O2 2.5-3L, HTN, HLD, GERD, mild MR, presenting from home with left side chest pain and SOB.  Started Friday while at church.  Does not remember any significant coughing spell, denies trauma.  Has felt more congested over several days but no increased SOB, change in chronic cough- occasionally whitish sputum, fever or chills, or sick contacts.  Uses prn O2 at home but not regularly.  Found on ER workup to have large 60% left sided pneumothorax with contralateral mediastinal shift c/w tension physiology however hemodynamics remained stable.  Emergent chest tube placed by [facility].  Follow-up CXR showed small residual pneumothorax with underlying bullous emphysema.  Pt to be transferred to [facility], [facility] admitting, PCCM consulted for chest tube management.   Pertinent  Medical History   Past Medical History:  Diagnosis Date   Arthritis    Aspiration pneumonia (HCC) 03/01/2012   Cancer of prostate (HCC)    COPD (chronic obstructive pulmonary disease) (HCC)    Depression    Dyspnea    GERD (gastroesophageal reflux disease)    Heart murmur    Hernia of abdominal cavity    Hypertension    Leaky heart valve    On home O2 PRN-does not use all the time.    3L prn   Significant Hospital Events: Including procedures, antibiotic start and stop dates in addition to other pertinent events   4/19 [facility] tx to [facility] for ptx  Interim History / Subjective:  C/o of left side soreness at CT insertion site  Objective   Blood pressure (!) 171/92, pulse 95, temperature 97.9 F (36.6 C), temperature source Oral, resp. rate (!) 23, height 6' 2.5" (1.892 m), weight 95.3 kg, SpO2 94%.         Intake/Output Summary (Last 24 hours) at 02/04/2024 1222 Last data filed at 02/04/2024 1103 Gross per 24 hour  Intake 3 ml  Output --  Net 3 ml   Filed Weights   02/04/24 0659  Weight: 95.3 kg   Examination: General:  Pleasant elderly male sitting on side of bed in NAD Neuro:  Aox 4, MAE CV: rr, SR-ST, +apex murmur PULM:  non labored, clear on right, variable slight inspiratory wheeze on left, left lateral pigtail ct to 20cm sxn, scant bloody drainage with cont 1/7 airleak GI: soft, bs+ Extremities: warm/dry, no LE edema  Skin: no rashes   Resolved Hospital Problem list    Assessment & Plan:   Spontaneous left sided pneumothorax- related to underlying bullous emphysema - previous chest CT 04/2023 > bilateral pleural blebs P:  - discussed with attending, CT chest to further evaluate blebs, might need TCTS consult if airleak does not seal - continue CT to 20 cm sxn overnight, reassess airleak in am - CXR in am - flush CT per protocol - prn tylenol / toradol / robaxin  for pain, oxy for severe pain   Hx COPD/ emphysema Chronic hypoxic respiratory failure- on home O2 prn - prn O2 for sat goal > 88% - prn albuterol  - dulera  in place of home symbicort    Remainder per primary team.  PCCM will continue to follow    Best Practice (right click and "Reselect all SmartList  Selections" daily)  Per primary team  Labs   CBC: Recent Labs  Lab 02/04/24 0705  WBC 9.6  NEUTROABS 6.5  HGB 16.3  HCT 50.7  MCV 83.4  PLT 227    Basic Metabolic Panel: Recent Labs  Lab 02/04/24 0705  NA 138  K 4.4  CL 106  CO2 24  GLUCOSE 103*  BUN 15  CREATININE 1.02  CALCIUM 9.5   GFR: Estimated Creatinine Clearance: 72.7 mL/min (by C-G formula based on SCr of 1.02 mg/dL). Recent Labs  Lab 02/04/24 0705  WBC 9.6    Liver Function Tests: Recent Labs  Lab 02/04/24 0705  AST 17  ALT 16  ALKPHOS 55  BILITOT 0.6  PROT 8.3*  ALBUMIN  4.1   No results for input(s): "LIPASE",  "AMYLASE" in the last 168 hours. No results for input(s): "AMMONIA" in the last 168 hours.  ABG    Component Value Date/Time   PHART 7.435 03/26/2015 2050   PCO2ART 39.7 03/26/2015 2050   PO2ART 79.6 (L) 03/26/2015 2050   HCO3 29.0 (H) 02/04/2024 0705   TCO2 22.1 03/26/2015 2050   ACIDBASEDEF 1.0 04/17/2014 0840   O2SAT 33 02/04/2024 0705     Coagulation Profile: No results for input(s): "INR", "PROTIME" in the last 168 hours.  Cardiac Enzymes: No results for input(s): "CKTOTAL", "CKMB", "CKMBINDEX", "TROPONINI" in the last 168 hours.  HbA1C: Hgb A1c MFr Bld  Date/Time Value Ref Range Status  08/29/2016 03:19 AM 5.5 4.8 - 5.6 % Final    Comment:    (NOTE)         Pre-diabetes: 5.7 - 6.4         Diabetes: >6.4         Glycemic control for adults with diabetes: <7.0     CBG: No results for input(s): "GLUCAP" in the last 168 hours.  Review of Systems:   Review of Systems  Constitutional:  Negative for chills and fever.  Respiratory:  Positive for cough and shortness of breath. Negative for hemoptysis, sputum production and wheezing.        Cough unchanged from baseline  Cardiovascular:  Positive for chest pain. Negative for leg swelling.  Musculoskeletal:  Negative for falls.   Past Medical History:  He,  has a past medical history of Arthritis, Aspiration pneumonia (HCC) (03/01/2012), Cancer of prostate (HCC), COPD (chronic obstructive pulmonary disease) (HCC), Depression, Dyspnea, GERD (gastroesophageal reflux disease), Heart murmur, Hernia of abdominal cavity, Hypertension, Leaky heart valve, and On home O2 PRN-does not use all the time..   Surgical History:   Past Surgical History:  Procedure Laterality Date   BIOPSY  08/17/2023   Procedure: BIOPSY;  Surgeon: [name], MD;  [facility];  Service: Endoscopy;;   BOWEL RESECTION  02/25/2012   Procedure: SMALL BOWEL RESECTION;  Surgeon: Lovena Rubinstein, MD;  Location: [facility];  Service: General;;    COLONOSCOPY N/A 05/27/2016   Dr.Rourk: 14 mm polyp in the sigmoid colon removed, tubular adenoma.  8 mm polyp in the descending colon removed, tubular adenoma.  Diverticulosis.  Recommend colonoscopy 3 years   COLONOSCOPY WITH PROPOFOL  N/A 09/27/2019   Procedure: COLONOSCOPY WITH PROPOFOL ;  Surgeon: [name], MD;  [facility]: [facility];  Service: Endoscopy;  Laterality: N/A;  12:30pm   COLONOSCOPY WITH PROPOFOL  N/A 08/17/2023   Procedure: COLONOSCOPY WITH PROPOFOL ;  Surgeon: [name], MD;  [facility]: [facility];  Service: Endoscopy;  Laterality: N/A;  2:15 pm, asa 3  HERNIA REPAIR     INCISION AND DRAINAGE ABSCESS N/A 01/15/2013   Procedure: INCISION AND DRAINAGE Abdominal Wall ABSCESS;  Surgeon: Diantha Fossa, MD;  Location: [facility] OR;  Service: General;  Laterality: N/A;   INGUINAL HERNIA REPAIR Right 09/16/2017   Procedure: RIGHT INGUINAL HERNIORRHAPHY  WITH MESH;  Surgeon: Alanda Allegra, MD;  Location: [facility];  Service: General;  Laterality: Right;   INSERTION OF MESH N/A 12/25/2012   Procedure: INSERTION OF MESH;  Surgeon: Diantha Fossa, MD;  Location: [facility] OR;  Service: General;  Laterality: N/A;   INSERTION OF MESH N/A 08/26/2016   Procedure: INSERTION OF MESH;  Surgeon: Jerryl Morin, MD;  Location: [facility] OR;  Service: General;  Laterality: N/A;   LAPAROTOMY  02/25/2012   Procedure: EXPLORATORY LAPAROTOMY;  Surgeon: Lovena Rubinstein, MD;  Location: [facility];  Service: General;  Laterality: N/A;   POLYPECTOMY  05/27/2016   Procedure: POLYPECTOMY;  Surgeon: [name], MD;  [facility];  Service: Endoscopy;;  Descending colon polyp and Sigmoid colon polyp removed via hot snare   POLYPECTOMY  09/27/2019   Procedure: POLYPECTOMY;  Surgeon: [name], MD;  [facility];  Service: Endoscopy;;   PROSTATE BIOPSY     robotic prostate surgery  2011   TRACHEOSTOMY  03/2012   VENTRAL HERNIA REPAIR  12/25/2012    Dr Mammie Sears   VENTRAL HERNIA REPAIR N/A 12/25/2012    Procedure: HERNIA REPAIR VENTRAL ADULT;  Surgeon: Diantha Fossa, MD;  Location: [facility] OR;  Service: General;  Laterality: N/A;   VENTRAL HERNIA REPAIR N/A 08/26/2016   Procedure: VENTRAL INCISIONAL HERNIA REPAIR WITH MESH;  Surgeon: Jerryl Morin, MD;  Location: [facility] OR;  Service: General;  Laterality: N/A;   WOUND DEBRIDEMENT  03/02/2012   Procedure: DEBRIDEMENT CLOSURE/ABDOMINAL WOUND;  Surgeon: Diantha Fossa, MD;  Location: [facility];  Service: General;  Laterality: N/A;   ZENKER'S DIVERTICULECTOMY  2013   Dr. Tellis Feathers     Social History:   reports that he quit smoking about 11 years ago. His smoking use included cigarettes. He started smoking about 55 years ago. He has a 66 pack-year smoking history. He has never used smokeless tobacco. He reports that he does not drink alcohol and does not use drugs.   Family History:  His family history includes Hypertension in his mother. There is no history of Pseudochol deficiency, Anesthesia problems, Hypotension, Malignant hyperthermia, or Colon cancer.   Allergies Allergies  Allergen Reactions   Lorazepam  Other (See Comments)    SEVERE DELERIUM AGITATION Tolerates midazolam  and other benzo's     Home Medications  Prior to Admission medications   Medication Sig Start Date End Date Taking? Authorizing Provider  acetaminophen  (TYLENOL ) 500 MG tablet Take 500 mg by mouth every 6 (six) hours as needed for headache.     [provider]  aspirin  EC 81 MG tablet Take 81 mg by mouth as needed. Takes 1 to 2 a month. Patient not taking: Reported on 09/14/2023    [provider]  bisoprolol  (ZEBETA ) 5 MG tablet Take 0.5 tablets (2.5 mg total) by mouth daily after supper. 06/28/23   [name], MD  furosemide  (LASIX ) 40 MG tablet Take 40 mg by mouth daily. 06/08/23   [provider]  gabapentin  (NEURONTIN ) 300 MG capsule Take 300 mg by mouth as needed.    [provider]  GAVILYTE-G 236 g solution SMARTSIG:Milliliter(s) By  Mouth 07/07/23   [provider]  ipratropium-albuterol  (DUONEB) 0.5-2.5 (3) MG/3ML SOLN Take 3 mLs by nebulization every 6 (six) hours as needed (for shortness of breath/wheezing).     [provider]  lubiprostone  (AMITIZA ) 24 MCG capsule Take one capsule once to twice daily for constipation. 04/29/23   [name], PA-C  meclizine (ANTIVERT) 25 MG tablet Take 25 mg by mouth 3 (three) times daily. 05/04/23   [provider]  meloxicam (MOBIC) 7.5 MG tablet SMARTSIG:1 Tablet(s) By Mouth Morning-Evening 04/04/23   [provider]  mesalamine  (LIALDA ) 1.2 g EC tablet Take 4 tablets (4.8 g total) by mouth daily with breakfast. 08/25/23   Rourk, Windsor Hatcher, MD  olmesartan  (BENICAR ) 20 MG tablet Take 1 tablet (20 mg total) by mouth daily. 11/28/23   Diamond Formica, MD  potassium chloride  SA (KLOR-CON  M) 20 MEQ tablet Take 20 mEq by mouth daily. 06/08/23   [provider]  sertraline  (ZOLOFT ) 25 MG tablet Take 25 mg by mouth daily. 04/04/23   [provider]  SYMBICORT  160-4.5 MCG/ACT inhaler Inhale 1 puff into the lungs 2 (two) times daily. 04/04/23   [provider]  tamsulosin  (FLOMAX ) 0.4 MG CAPS capsule Take 1 capsule by mouth daily. 07/05/18   [provider]     Critical care time: n/a       Early Glisson, MSN, AG-ACNP-BC [facility] 02/04/2024, 2:03 PM  See Amion for pager If no response to pager , please call [phone] until 7pm After 7:00 pm call Elink  [phone]

## 2024-02-04 NOTE — ED Triage Notes (Signed)
 Pt with c/o SOB that he states started after [facility] yesterday. States that sob has gotten worse. Pt also c/o intermittent chest pain.

## 2024-02-04 NOTE — ED Notes (Signed)
 Dr Larrie Po came down and inserted chest tube on patient. Pt in room now comfortable at this time.

## 2024-02-04 NOTE — Progress Notes (Signed)
   PCCM transfer request    PTX, chest tube placed Okay to transfer for ongoing management Appreciate [facility] admitting, will see in consult when here, please message   Josiah Nigh 02/04/24 9:05 AM [facility]  For contact information, see Amion. If no response to pager, please call PCCM consult pager. After hours, 7PM- 7AM, please call Elink.

## 2024-02-04 NOTE — Op Note (Signed)
 Patient:  Michael Ellison  DOB:  [birth year]  MRN:  [id number]   Preop Diagnosis: Tension left pneumothorax  Postop Diagnosis: Same  Procedure: Left chest tube placement  Surgeon: Alanda Allegra, MD  Anes: Local  Indications: Patient is a 78 year old black male who presented emergency room with left-sided chest pain.  Chest x-ray revealed a left tension pneumothorax.  Surgery has been consulted for chest tube placement.  The risks and benefits of the procedure including bleeding, infection, and lung injury were fully explained to the patient, who gave informed consent.  Procedure note: The patient was placed in the right lateral decubitus position in room 2 in the emergency room.  The left mid axillary line was prepped and draped using the usual sterile technique with ChloraPrep.  Surgical site confirmation was performed.  1% Xylocaine  was used for local anesthesia.  A needle was advanced over the rib and air was aspirated.  A guidewire was then advanced into the left chest.  An introducer was placed over the guidewire.  A pigtail catheter was then inserted into the left chest without difficulty.  A rush of air was noted.  The pigtail catheter was then attached to the Pleurovac.  Air was within the [employer].  The pigtail catheter was secured using a 3-0 silk suture.  A dry sterile dressing was applied.  The patient tolerated the procedure well.  A follow-up chest x-ray shows near complete resolution of the pneumothorax.  Complications: None  EBL: Minimal  Specimen: None

## 2024-02-05 ENCOUNTER — Inpatient Hospital Stay (HOSPITAL_COMMUNITY)

## 2024-02-05 DIAGNOSIS — J9311 Primary spontaneous pneumothorax: Secondary | ICD-10-CM

## 2024-02-05 DIAGNOSIS — J449 Chronic obstructive pulmonary disease, unspecified: Secondary | ICD-10-CM | POA: Diagnosis not present

## 2024-02-05 DIAGNOSIS — J9383 Other pneumothorax: Secondary | ICD-10-CM | POA: Diagnosis not present

## 2024-02-05 LAB — CBC
HCT: 47.5 % (ref 39.0–52.0)
Hemoglobin: 15.3 g/dL (ref 13.0–17.0)
MCH: 26.6 pg (ref 26.0–34.0)
MCHC: 32.2 g/dL (ref 30.0–36.0)
MCV: 82.6 fL (ref 80.0–100.0)
Platelets: 215 10*3/uL (ref 150–400)
RBC: 5.75 MIL/uL (ref 4.22–5.81)
RDW: 16.2 % — ABNORMAL HIGH (ref 11.5–15.5)
WBC: 13.9 10*3/uL — ABNORMAL HIGH (ref 4.0–10.5)
nRBC: 0 % (ref 0.0–0.2)

## 2024-02-05 LAB — BLOOD GAS, ARTERIAL
Acid-base deficit: 1.1 mmol/L (ref 0.0–2.0)
Bicarbonate: 23.5 mmol/L (ref 20.0–28.0)
O2 Saturation: 95.8 %
Patient temperature: 37
pCO2 arterial: 38 mmHg (ref 32–48)
pH, Arterial: 7.4 (ref 7.35–7.45)
pO2, Arterial: 70 mmHg — ABNORMAL LOW (ref 83–108)

## 2024-02-05 LAB — COMPREHENSIVE METABOLIC PANEL WITH GFR
ALT: 14 U/L (ref 0–44)
AST: 16 U/L (ref 15–41)
Albumin: 3.4 g/dL — ABNORMAL LOW (ref 3.5–5.0)
Alkaline Phosphatase: 43 U/L (ref 38–126)
Anion gap: 11 (ref 5–15)
BUN: 29 mg/dL — ABNORMAL HIGH (ref 8–23)
CO2: 22 mmol/L (ref 22–32)
Calcium: 9.1 mg/dL (ref 8.9–10.3)
Chloride: 105 mmol/L (ref 98–111)
Creatinine, Ser: 1.25 mg/dL — ABNORMAL HIGH (ref 0.61–1.24)
GFR, Estimated: 60 mL/min — ABNORMAL LOW (ref >60)
Glucose, Bld: 104 mg/dL — ABNORMAL HIGH (ref 70–99)
Potassium: 4.2 mmol/L (ref 3.5–5.1)
Sodium: 138 mmol/L (ref 135–145)
Total Bilirubin: 0.9 mg/dL (ref 0.0–1.2)
Total Protein: 6.9 g/dL (ref 6.5–8.1)

## 2024-02-05 LAB — PROTIME-INR
INR: 1.2 (ref 0.8–1.2)
Prothrombin Time: 15.1 s (ref 11.4–15.2)

## 2024-02-05 MED ORDER — CEFAZOLIN SODIUM-DEXTROSE 2-4 GM/100ML-% IV SOLN
2.0000 g | INTRAVENOUS | Status: AC
  Administered 2024-02-06: 2 g via INTRAVENOUS
  Filled 2024-02-05: qty 100

## 2024-02-05 NOTE — Progress Notes (Signed)
 At 1900 patient saturating 94-96% on 2 L . Noted at 2300 to be saturating in high about 87-90%. O2 turned up to 3 L at this time. Patient improved to 90-94%. CT with moderate to moderate-large air leak (agrees w report from day nurse), dressing intact. On -20 cm suction. Lungs with expiratory wheezes upper bases, dim in lower.  At 0105, patient puts light on to report having trouble breathing and feeling more congested. Lungs with expiratory wheezes and rhonchi throughout. Prn neb given. Slight improvement only. MD paged.

## 2024-02-05 NOTE — Plan of Care (Signed)

## 2024-02-05 NOTE — H&P (View-Only) (Signed)
 Reason for Consult: Spontaneous pneumothorax Referring Physician: Dr. Felipe Horton, CCM.  Michael Ellison is an 78 y.o. male.  HPI: 78 year old gentleman presented with a chief complaint of shortness of breath.  Michael Ellison is a 78 year old man with a past history significant for tobacco abuse, COPD, home O2, heart murmur, mild mitral regurgitation, aspiration pneumonia, hypertension, paroxysmal SVT, ventral hernia, prostate cancer, ethanol abuse, depression, and arthritis.  He reports history of pneumothorax following and abdominal surgery that was complicated by prolonged hospitalization and tracheostomy.  No other history.  He was in his usual state of health until Friday when he noted sudden onset of shortness of breath.  He initially tried to wait it out, but with time had progressively worsening shortness of breath and left-sided chest pain.  He went to the ED at [facility] yesterday.  Chest x-ray showed a tension pneumothorax.  A pigtail catheter was placed with good reexpansion of the lung.  Transferred to [name] for further management.   Past Medical History:  Diagnosis Date   Arthritis    Aspiration pneumonia (HCC) 03/01/2012   Cancer of prostate (HCC)    COPD (chronic obstructive pulmonary disease) (HCC)    Depression    Dyspnea    GERD (gastroesophageal reflux disease)    Heart murmur    Hernia of abdominal cavity    Hypertension    Leaky heart valve    On home O2 PRN-does not use all the time.    3L prn    Past Surgical History:  Procedure Laterality Date   BIOPSY  08/17/2023   Procedure: BIOPSY;  Surgeon: [name], MD;  [facility];  Service: Endoscopy;;   BOWEL RESECTION  02/25/2012   Procedure: SMALL BOWEL RESECTION;  Surgeon: Lovena Rubinstein, MD;  Location: [facility] ORS;  Service: General;;   COLONOSCOPY N/A 05/27/2016   Dr.Rourk: 14 mm polyp in the sigmoid colon removed, tubular adenoma.  8 mm polyp in the descending colon removed, tubular adenoma.   Diverticulosis.  Recommend colonoscopy 3 years   COLONOSCOPY WITH PROPOFOL  N/A 09/27/2019   Procedure: COLONOSCOPY WITH PROPOFOL ;  Surgeon: [name], MD;  [facility];  Service: Endoscopy;  Laterality: N/A;  12:30pm   COLONOSCOPY WITH PROPOFOL  N/A 08/17/2023   Procedure: COLONOSCOPY WITH PROPOFOL ;  Surgeon: [name], MD;  [facility];  Service: Endoscopy;  Laterality: N/A;  2:15 pm, asa 3   HERNIA REPAIR     INCISION AND DRAINAGE ABSCESS N/A 01/15/2013   Procedure: INCISION AND DRAINAGE Abdominal Wall ABSCESS;  Surgeon: Diantha Fossa, MD;  Location: [facility] OR;  Service: General;  Laterality: N/A;   INGUINAL HERNIA REPAIR Right 09/16/2017   Procedure: RIGHT INGUINAL HERNIORRHAPHY  WITH MESH;  Surgeon: Alanda Allegra, MD;  Location: [facility];  Service: General;  Laterality: Right;   INSERTION OF MESH N/A 12/25/2012   Procedure: INSERTION OF MESH;  Surgeon: Diantha Fossa, MD;  Location: [facility] OR;  Service: General;  Laterality: N/A;   INSERTION OF MESH N/A 08/26/2016   Procedure: INSERTION OF MESH;  Surgeon: Jerryl Morin, MD;  Location: [facility] OR;  Service: General;  Laterality: N/A;   LAPAROTOMY  02/25/2012   Procedure: EXPLORATORY LAPAROTOMY;  Surgeon: Lovena Rubinstein, MD;  Location: [facility];  Service: General;  Laterality: N/A;   POLYPECTOMY  05/27/2016   Procedure: POLYPECTOMY;  Surgeon: [name], MD;  [facility];  Service: Endoscopy;;  Descending colon polyp and Sigmoid  colon polyp removed via hot snare   POLYPECTOMY  09/27/2019   Procedure: POLYPECTOMY;  Surgeon: [name], MD;  [facility];  Service: Endoscopy;;   PROSTATE BIOPSY     robotic prostate surgery  2011   TRACHEOSTOMY  03/2012   VENTRAL HERNIA REPAIR  12/25/2012    Dr Mammie Sears   VENTRAL HERNIA REPAIR N/A 12/25/2012   Procedure: HERNIA REPAIR VENTRAL ADULT;  Surgeon: Diantha Fossa, MD;  Location: [facility] OR;  Service: General;  Laterality: N/A;   VENTRAL HERNIA REPAIR N/A 08/26/2016    Procedure: VENTRAL INCISIONAL HERNIA REPAIR WITH MESH;  Surgeon: Jerryl Morin, MD;  Location: [facility] OR;  Service: General;  Laterality: N/A;   WOUND DEBRIDEMENT  03/02/2012   Procedure: DEBRIDEMENT CLOSURE/ABDOMINAL WOUND;  Surgeon: Diantha Fossa, MD;  Location[facility];  Service: General;  Laterality: N/A;   ZENKER'S DIVERTICULECTOMY  2013   Dr. Tellis Feathers    Family History  Problem Relation Age of Onset   Hypertension Mother    Pseudochol deficiency Neg Hx    Anesthesia problems Neg Hx    Hypotension Neg Hx    Malignant hyperthermia Neg Hx    Colon cancer Neg Hx     Social History:  reports that he quit smoking about 11 years ago. His smoking use included cigarettes. He started smoking about 55 years ago. He has a 66 pack-year smoking history. He has never used smokeless tobacco. He reports that he does not drink alcohol and does not use drugs.  Allergies:  Allergies  Allergen Reactions   Lorazepam  Other (See Comments)    SEVERE DELERIUM AGITATION Tolerates midazolam  and other benzo's    Medications: Scheduled:  aspirin  EC  81 mg Oral Q breakfast   azithromycin   500 mg Oral Daily   bisoprolol   2.5 mg Oral QPC supper   budeson-glycopyrrolate -formoterol   2 puff Inhalation BID   Chlorhexidine  Gluconate Cloth  6 each Topical Daily   dextromethorphan -guaiFENesin   1 tablet Oral BID   gabapentin   300 mg Oral BID   heparin   5,000 Units Subcutaneous Q8H   ipratropium-albuterol   3 mL Nebulization Q6H   irbesartan   75 mg Oral Daily   lubiprostone   24 mcg Oral Q breakfast   mesalamine   4.8 g Oral Q breakfast   mupirocin  ointment  1 Application Nasal BID   sodium chloride  flush  10 mL Intrapleural Q8H   sodium chloride  flush  3 mL Intravenous Q12H   sodium chloride  flush  3 mL Intravenous Q12H   tamsulosin   0.4 mg Oral Daily    Results for orders placed or performed during the hospital encounter of 02/04/24 (from the past 48 hours)  Basic metabolic panel     Status: Abnormal    Collection Time: 02/04/24  7:05 AM  Result Value Ref Range   Sodium 138 135 - 145 mmol/L   Potassium 4.4 3.5 - 5.1 mmol/L   Chloride 106 98 - 111 mmol/L   CO2 24 22 - 32 mmol/L   Glucose, Bld 103 (H) 70 - 99 mg/dL    Comment: Glucose reference range applies only to samples taken after fasting for at least 8 hours.   BUN 15 8 - 23 mg/dL   Creatinine, Ser 4.78 0.61 - 1.24 mg/dL   Calcium 9.5 8.9 - 10.3 mg/dL   GFR, Estimated >29 >56 mL/min    Comment: (NOTE) Calculated using the CKD-EPI Creatinine Equation (2021)    Anion gap 8 5 - 15  Comment: Performed at [facility], [address] [place], [place] [address]  Blood gas, venous (at [facility] and [facility])     Status: Abnormal   Collection Time: 02/04/24  7:05 AM  Result Value Ref Range   pH, Ven 7.34 7.25 - 7.43   pCO2, Ven 54 44 - 60 mmHg   pO2, Ven <31 (LL) 32 - 45 mmHg    Comment: CRITICAL RESULT CALLED TO, READ BACK BY AND VERIFIED WITH: ANGIE @ 0729 ON [id number] BY HENDERSON L    Bicarbonate 29.0 (H) 20.0 - 28.0 mmol/L   Acid-Base Excess 1.9 0.0 - 2.0 mmol/L   O2 Saturation 33 %   Patient temperature 36.5    Collection site RIGHT ANTECUBITAL    Drawn by [phone]     Comment: Performed at [facility], [address], [place], [place] [address]  Hepatic function panel     Status: Abnormal   Collection Time: 02/04/24  7:05 AM  Result Value Ref Range   Total Protein 8.3 (H) 6.5 - 8.1 g/dL   Albumin  4.1 3.5 - 5.0 g/dL   AST 17 15 - 41 U/L   ALT 16 0 - 44 U/L   Alkaline Phosphatase 55 38 - 126 U/L   Total Bilirubin 0.6 0.0 - 1.2 mg/dL   Bilirubin, Direct 0.2 0.0 - 0.2 mg/dL   Indirect Bilirubin 0.4 0.3 - 0.9 mg/dL    Comment: Performed at [facility], [address] [place], [place] [address]  CBC with Differential     Status: Abnormal   Collection Time: 02/04/24  7:05 AM  Result Value Ref Range   WBC 9.6 4.0 - 10.5 K/uL   RBC 6.08 (H) 4.22 - 5.81 MIL/uL   Hemoglobin 16.3 13.0 - 17.0 g/dL   HCT 62.9 52.8 - 41.3 %   MCV 83.4 80.0 -  100.0 fL   MCH 26.8 26.0 - 34.0 pg   MCHC 32.1 30.0 - 36.0 g/dL   RDW 24.4 (H) 01.0 - 27.2 %   Platelets 227 150 - 400 K/uL   nRBC 0.0 0.0 - 0.2 %   Neutrophils Relative % 68 %   Neutro Abs 6.5 1.7 - 7.7 K/uL   Lymphocytes Relative 22 %   Lymphs Abs 2.1 0.7 - 4.0 K/uL   Monocytes Relative 7 %   Monocytes Absolute 0.7 0.1 - 1.0 K/uL   Eosinophils Relative 2 %   Eosinophils Absolute 0.2 0.0 - 0.5 K/uL   Basophils Relative 1 %   Basophils Absolute 0.1 0.0 - 0.1 K/uL   Immature Granulocytes 0 %   Abs Immature Granulocytes 0.03 0.00 - 0.07 K/uL    Comment: Performed at [facility], [address], [place], [place] [address]  Brain natriuretic peptide     Status: Abnormal   Collection Time: 02/04/24  7:13 AM  Result Value Ref Range   B Natriuretic Peptide 123.0 (H) 0.0 - 100.0 pg/mL    Comment: Performed at [facility], [address] [place], [place] [address]  Resp panel by RT-PCR (RSV, Flu A&B, Covid) Anterior Nasal Swab     Status: None   Collection Time: 02/04/24  7:30 AM   Specimen: Anterior Nasal Swab  Result Value Ref Range   SARS Coronavirus 2 by RT PCR NEGATIVE NEGATIVE    Comment: (NOTE) SARS-CoV-2 target nucleic acids are NOT DETECTED.  The SARS-CoV-2 RNA is generally detectable in upper respiratory specimens during the acute phase of infection. The lowest concentration of SARS-CoV-2 viral copies  this assay can detect is 138 copies/mL. A negative result does not preclude SARS-Cov-2 infection and should not be used as the sole basis for treatment or other patient management decisions. A negative result may occur with  improper specimen collection/handling, submission of specimen other than nasopharyngeal swab, presence of viral mutation(s) within the areas targeted by this assay, and inadequate number of viral copies(<138 copies/mL). A negative result must be combined with clinical observations, patient history, and epidemiological information. The expected result is  Negative.  Fact Sheet for Patients:  BloggerCourse.com  Fact Sheet for Healthcare Providers:  SeriousBroker.it  This test is no t yet approved or cleared by the [place]  FDA and  has been authorized for detection and/or diagnosis of SARS-CoV-2 by FDA under an Emergency Use Authorization (EUA). This EUA will remain  in effect (meaning this test can be used) for the duration of the COVID-19 declaration under Section 564(b)(1) of the Act, 21 U.S.C.section 360bbb-3(b)(1), unless the authorization is terminated  or revoked sooner.       Influenza A by PCR NEGATIVE NEGATIVE   Influenza B by PCR NEGATIVE NEGATIVE    Comment: (NOTE) The Xpert Xpress SARS-CoV-2/FLU/RSV plus assay is intended as an aid in the diagnosis of influenza from Nasopharyngeal swab specimens and should not be used as a sole basis for treatment. Nasal washings and aspirates are unacceptable for Xpert Xpress SARS-CoV-2/FLU/RSV testing.  Fact Sheet for Patients: BloggerCourse.com  Fact Sheet for Healthcare Providers: SeriousBroker.it  This test is not yet approved or cleared by the [place]  FDA and has been authorized for detection and/or diagnosis of SARS-CoV-2 by FDA under an Emergency Use Authorization (EUA). This EUA will remain in effect (meaning this test can be used) for the duration of the COVID-19 declaration under Section 564(b)(1) of the Act, 21 U.S.C. section 360bbb-3(b)(1), unless the authorization is terminated or revoked.     Resp Syncytial Virus by PCR NEGATIVE NEGATIVE    Comment: (NOTE) Fact Sheet for Patients: BloggerCourse.com  Fact Sheet for Healthcare Providers: SeriousBroker.it  This test is not yet approved or cleared by the [place]  FDA and has been authorized for detection and/or diagnosis of SARS-CoV-2 by FDA under an  Emergency Use Authorization (EUA). This EUA will remain in effect (meaning this test can be used) for the duration of the COVID-19 declaration under Section 564(b)(1) of the Act, 21 U.S.C. section 360bbb-3(b)(1), unless the authorization is terminated or revoked.  Performed at [facility], [address] [place], [place] [address]   MRSA Next Gen by PCR, Nasal     Status: Abnormal   Collection Time: 02/04/24  2:02 PM   Specimen: Nasal Mucosa; Nasal Swab  Result Value Ref Range   MRSA by PCR Next Gen DETECTED (A) NOT DETECTED    Comment: CRITICAL RESULT CALLED TO, READ BACK BY AND VERIFIED WITH: RN MONICA SIXTOS [id number] BY J RAZZAK, MT (NOTE) The GeneXpert MRSA Assay (FDA approved for NASAL specimens only), is one component of a comprehensive MRSA colonization surveillance program. It is not intended to diagnose MRSA infection nor to guide or monitor treatment for MRSA infections. Test performance is not FDA approved in patients less than 77 years old. Performed at [facility], [address], [place], [place] [address]     DG CHEST PORT 1 VIEW Result Date: 02/05/2024 CLINICAL DATA:  78 year old male with bullous emphysema and spontaneous pneumothorax. EXAM: PORTABLE CHEST 1 VIEW COMPARISON:  Chest CT yesterday and earlier. FINDINGS: Portable [facility]  view at 0304 hours. Stable left chest tube position since yesterday morning. Lower lung volumes. Ongoing left pneumothorax, not significantly changed from yesterday and up to moderate by CT (please see that report) Stable cardiac size and mediastinal contours. Stable ventilation otherwise. Negative visible bowel gas.  Stable visualized osseous structures. IMPRESSION: Stable left chest tube position since yesterday morning and unchanged left pneumothorax, which was Moderate by CT yesterday. Consider up sizing tube. Lower lung volumes today.  Bullous emphysema. Electronically Signed   By: Marlise Simpers M.D.   On: 02/05/2024 04:27   CT CHEST  WO CONTRAST Result Date: 02/05/2024 CLINICAL DATA:  78 year old male with bullous emphysema and spontaneous pneumothorax. EXAM: CT CHEST WITHOUT CONTRAST TECHNIQUE: Multidetector CT imaging of the chest was performed following the standard protocol without IV contrast. RADIATION DOSE REDUCTION: This exam was performed according to the departmental dose-optimization program which includes automated exposure control, adjustment of the mA and/or kV according to patient size and/or use of iterative reconstruction technique. COMPARISON:  Chest CT 04/25/2023. Portable chest radiographs 0830 hours the same day and earlier. FINDINGS: Cardiovascular: Mild pneumothorax related mass effect on the right heart border. Overall cardiac size is stable from last year and within normal limits. No pericardial effusion. Vascular patency is not evaluated in the absence of IV contrast. Mild for age Calcified aortic atherosclerosis. Mediastinum/Nodes: Negative for mediastinal mass or lymphadenopathy in the absence of IV contrast. Lungs/Pleura: Bullous emphysema. CT last year demonstrated moderate to severe bullae along the mediastinal contours and in the lung apices more pronounced on the right at that time. Left-side pneumothorax with pigtail left chest tube in place entering the pleural space via the 4/5 left inter costal space. Moderate volume of residual pleural air especially at the left lung base (series 4, image 119). Confluent opacity in the lingula, segmental left lower lobe is probably lung collapse related atelectasis. The central airways remain patent. No superimposed pleural effusion. Right lung parenchyma appears stable from last year. Upper Abdomen: Negative visible noncontrast liver, gallbladder, spleen pancreas, right adrenal gland, right kidney, and bowel in the upper abdomen. Musculoskeletal: Small volume of chest tube related left lateral chest wall soft tissue gas. Spine degeneration, thoracic scoliosis. Stable  visualized osseous structures. IMPRESSION: 1. Bullous Emphysema (ICD10-J43.9) with Moderate-sized residual Left Pneumothorax despite pigtail chest tube in place. Associated mild mass effect on the right heart border. Left lingula and lower lobe atelectasis. No pleural effusion. 2.  Aortic Atherosclerosis (ICD10-I70.0). Electronically Signed   By: Marlise Simpers M.D.   On: 02/05/2024 04:26   DG Chest Portable 1 View Result Date: 02/04/2024 CLINICAL DATA:  78 year old male chest tube placement for left pneumothorax. EXAM: PORTABLE CHEST 1 VIEW COMPARISON:  Portable chest 0719 hours today.  Chest CT 04/25/2023. FINDINGS: Portable AP semi upright view at 0830 hours. Bullous emphysema demonstrated by CT last year. Pigtail left chest tube has been placed at the area of previous moderate to large left pneumothorax. A small volume of pleural air persists visible both along the medial left lung and at the left costophrenic angle. Moderately good left lung re-expansion. No mediastinal shift. Stable mediastinal contours. Stable right lung. No acute osseous abnormality identified. Negative visible bowel gas. IMPRESSION: Left chest tube placed with small volume residual left pneumothorax. Underlying Bullous Emphysema (ICD10-J43.9). Electronically Signed   By: Marlise Simpers M.D.   On: 02/04/2024 08:37   DG Chest Port 1 View Result Date: 02/04/2024 CLINICAL DATA:  Shortness of breath onset yesterday.  Chest pain. EXAM: PORTABLE  CHEST 1 VIEW COMPARISON:  PA and lateral chest 06/09/2023 FINDINGS: Left lung has partially collapsed medially with pneumothorax estimated about 60% of the left chest volume with contralateral mediastinal shift consistent with tension. Emphysematous and coarse chronic interstitial changes are again noted. There is mild biapical pleuroparenchymal scar-like opacity. There is compressive atelectasis in the left lung, no focal infiltrate on the right. There is mild cardiomegaly. No vascular congestion is seen.  Mediastinum is normally outlined. There is calcification in the transverse aorta. Mild thoracic dextroscoliosis with no new osseous finding. Osteopenia. IMPRESSION: 1. Left pneumothorax estimated about 60% of the left chest volume with contralateral mediastinal shift consistent with tension. 2. Emphysematous and chronic changes. 3. Mild cardiomegaly. 4. Aortic atherosclerosis. 5. Critical Value/emergent results were called by telephone at the time of interpretation on 02/04/2024 at 7:40 am to provider [facility] ZAMMIT , who verbally acknowledged these results. Electronically Signed   By: Denman Fischer M.D.   On: 02/04/2024 07:40    Review of Systems  Constitutional:  Negative for activity change.  HENT:  Negative for trouble swallowing and voice change.   Respiratory:  Positive for shortness of breath and wheezing.   Cardiovascular:  Negative for chest pain.  Musculoskeletal:  Positive for arthralgias and joint swelling.  Psychiatric/Behavioral:  Positive for dysphoric mood.    Blood pressure (!) 146/101, pulse 88, temperature (!) 96.8 F (36 C), temperature source Axillary, resp. rate 19, height 6' 2.5" (1.892 m), weight 95.3 kg, SpO2 93%. Physical Exam Vitals reviewed.  Constitutional:      General: He is not in acute distress.    Appearance: Normal appearance.  HENT:     Head: Normocephalic and atraumatic.  Eyes:     Extraocular Movements: Extraocular movements intact.  Cardiovascular:     Rate and Rhythm: Normal rate and regular rhythm.     Heart sounds: Murmur (2/6 systolic) heard.  Pulmonary:     Effort: Pulmonary effort is normal. No respiratory distress.     Breath sounds: Rhonchi (Faint bilaterally) present.  Abdominal:     General: There is no distension.     Palpations: Abdomen is soft.  Lymphadenopathy:     Cervical: No cervical adenopathy.  Skin:    General: Skin is warm and dry.  Neurological:     General: No focal deficit present.     Mental Status: He is alert and  oriented to person, place, and time.     Cranial Nerves: No cranial nerve deficit.     Motor: No weakness.     Assessment/Plan: Michael Ellison is a 78 year old man with a past history significant for tobacco abuse, COPD, home O2, heart murmur, mild mitral regurgitation, aspiration pneumonia, hypertension, paroxysmal SVT, ventral hernia, prostate cancer, ethanol abuse, depression, and arthritis.  Spontaneous left pneumothorax-in setting of bullous COPD.  Chest tube placed and symptoms improved.  High likelihood of recurrence without definitive management.  Best option is to proceed with VATS for bleb resection and pleural abrasion.  I discussed the postoperative procedure with Mr. Magid.  I informed him of the general nature of the procedure including the need for general anesthesia, the incisions to be used, use of drains to postoperatively, the expected hospital stay, and the overall recovery.  I informed him of the indications, risks, benefits, and alternatives.  He understands the risks include, but not limited to death, MI, DVT, PE, bleeding, possible need for transfusion, infection, prolonged air leak, cardiac arrhythmias, as well as the possibility of other unstable complications.  He will talk to his family about the procedure, but is inclined to proceed.  Heart murmur-known mild MR from echo in September 2024.  Normal LV and RV function.  [name] 02/05/2024, 9:52 AM

## 2024-02-05 NOTE — Consult Note (Signed)
   NAME:  Michael Ellison, MRN:  [id number], DOB:  1946-01-16, LOS: 1 ADMISSION DATE:  02/04/2024, CONSULTATION DATE:  02/04/24 REFERRING MD:  Dr. Bryna Car, CHIEF COMPLAINT: SOB   History of Present Illness:   52 yoM with PMH as below significant for former smoker, COPD/ bullous emphysema, chronic hypoxic respiratory failure on prn home O2 2.5-3L, HTN, HLD, GERD, mild MR, presenting from home with left side chest pain and SOB.  Started Friday while at church.  Does not remember any significant coughing spell, denies trauma.  Has felt more congested over several days but no increased SOB, change in chronic cough- occasionally whitish sputum, fever or chills, or sick contacts.  Uses prn O2 at home but not regularly.  Found on ER workup to have large 60% left sided pneumothorax with contralateral mediastinal shift c/w tension physiology however hemodynamics remained stable.  Emergent chest tube placed by [facility].  Follow-up CXR showed small residual pneumothorax with underlying bullous emphysema.  Pt to be transferred to [facility] admitting, PCCM consulted for chest tube management.   Pertinent  Medical History   Past Medical History:  Diagnosis Date   Arthritis    Aspiration pneumonia (HCC) 03/01/2012   Cancer of prostate (HCC)    COPD (chronic obstructive pulmonary disease) (HCC)    Depression    Dyspnea    GERD (gastroesophageal reflux disease)    Heart murmur    Hernia of abdominal cavity    Hypertension    Leaky heart valve    On home O2 PRN-does not use all the time.    3L prn   Significant Hospital Events: Including procedures, antibiotic start and stop dates in addition to other pertinent events   4/19 [facility] tx to [facility] for ptx  Interim History / Subjective:  No events.  Objective   Blood pressure (!) 150/98, pulse 88, temperature 97.7 F (36.5 C), temperature source Oral, resp. rate 15, height 6' 2.5" (1.892 m), weight 95.3 kg, SpO2 94%.        Intake/Output Summary (Last 24 hours)  at 02/05/2024 1301 Last data filed at 02/05/2024 0800 Gross per 24 hour  Intake 240 ml  Output 320 ml  Net -80 ml   Filed Weights   02/04/24 0659  Weight: 95.3 kg   Examination: No distress Sats 94 on 2LPM 1+ air leak on atrium Moves to command RASS 0  Incomplete L expansion on CXR and CT  Resolved Hospital Problem list    Assessment & Plan:   Spontaneous L PTX in context of bullous lung disease- incomplete expansion but fairly asymptomatic.  Will increase suction to 30, if more symptoms can place anterior tube.  Appreciate Dr. Audree Bless input, would probably benefit from VATS to prevent recurrence risk if he is okay with risk/benefit.  - Pigtail to -30 - AM CXR - O2 for sats > 90% - TCTS to follow to consider OR this week  Ardelle Kos MD PCCM

## 2024-02-05 NOTE — Consult Note (Signed)
 Reason for Consult: Spontaneous pneumothorax Referring Physician: Dr. Felipe Horton, CCM.  Michael Ellison is an 78 y.o. male.  HPI: 78 year old gentleman presented with a chief complaint of shortness of breath.  Michael Ellison is a 78 year old man with a past history significant for tobacco abuse, COPD, home O2, heart murmur, mild mitral regurgitation, aspiration pneumonia, hypertension, paroxysmal SVT, ventral hernia, prostate cancer, ethanol abuse, depression, and arthritis.  He reports history of pneumothorax following and abdominal surgery that was complicated by prolonged hospitalization and tracheostomy.  No other history.  He was in his usual state of health until Friday when he noted sudden onset of shortness of breath.  He initially tried to wait it out, but with time had progressively worsening shortness of breath and left-sided chest pain.  He went to the ED at [facility] yesterday.  Chest x-ray showed a tension pneumothorax.  A pigtail catheter was placed with good reexpansion of the lung.  Transferred to [name] for further management.   Past Medical History:  Diagnosis Date   Arthritis    Aspiration pneumonia (HCC) 03/01/2012   Cancer of prostate (HCC)    COPD (chronic obstructive pulmonary disease) (HCC)    Depression    Dyspnea    GERD (gastroesophageal reflux disease)    Heart murmur    Hernia of abdominal cavity    Hypertension    Leaky heart valve    On home O2 PRN-does not use all the time.    3L prn    Past Surgical History:  Procedure Laterality Date   BIOPSY  08/17/2023   Procedure: BIOPSY;  Surgeon: [name], MD;  [facility];  Service: Endoscopy;;   BOWEL RESECTION  02/25/2012   Procedure: SMALL BOWEL RESECTION;  Surgeon: Lovena Rubinstein, MD;  Location: [facility] ORS;  Service: General;;   COLONOSCOPY N/A 05/27/2016   Dr.Rourk: 14 mm polyp in the sigmoid colon removed, tubular adenoma.  8 mm polyp in the descending colon removed, tubular adenoma.   Diverticulosis.  Recommend colonoscopy 3 years   COLONOSCOPY WITH PROPOFOL  N/A 09/27/2019   Procedure: COLONOSCOPY WITH PROPOFOL ;  Surgeon: [name], MD;  [facility];  Service: Endoscopy;  Laterality: N/A;  12:30pm   COLONOSCOPY WITH PROPOFOL  N/A 08/17/2023   Procedure: COLONOSCOPY WITH PROPOFOL ;  Surgeon: [name], MD;  [facility];  Service: Endoscopy;  Laterality: N/A;  2:15 pm, asa 3   HERNIA REPAIR     INCISION AND DRAINAGE ABSCESS N/A 01/15/2013   Procedure: INCISION AND DRAINAGE Abdominal Wall ABSCESS;  Surgeon: Diantha Fossa, MD;  Location: [facility] OR;  Service: General;  Laterality: N/A;   INGUINAL HERNIA REPAIR Right 09/16/2017   Procedure: RIGHT INGUINAL HERNIORRHAPHY  WITH MESH;  Surgeon: Alanda Allegra, MD;  Location: [facility];  Service: General;  Laterality: Right;   INSERTION OF MESH N/A 12/25/2012   Procedure: INSERTION OF MESH;  Surgeon: Diantha Fossa, MD;  Location: [facility] OR;  Service: General;  Laterality: N/A;   INSERTION OF MESH N/A 08/26/2016   Procedure: INSERTION OF MESH;  Surgeon: Jerryl Morin, MD;  Location: [facility] OR;  Service: General;  Laterality: N/A;   LAPAROTOMY  02/25/2012   Procedure: EXPLORATORY LAPAROTOMY;  Surgeon: Lovena Rubinstein, MD;  Location: [facility];  Service: General;  Laterality: N/A;   POLYPECTOMY  05/27/2016   Procedure: POLYPECTOMY;  Surgeon: [name], MD;  [facility];  Service: Endoscopy;;  Descending colon polyp and Sigmoid  colon polyp removed via hot snare   POLYPECTOMY  09/27/2019   Procedure: POLYPECTOMY;  Surgeon: [name], MD;  [facility];  Service: Endoscopy;;   PROSTATE BIOPSY     robotic prostate surgery  2011   TRACHEOSTOMY  03/2012   VENTRAL HERNIA REPAIR  12/25/2012    Dr Mammie Sears   VENTRAL HERNIA REPAIR N/A 12/25/2012   Procedure: HERNIA REPAIR VENTRAL ADULT;  Surgeon: Diantha Fossa, MD;  Location: [facility] OR;  Service: General;  Laterality: N/A;   VENTRAL HERNIA REPAIR N/A 08/26/2016    Procedure: VENTRAL INCISIONAL HERNIA REPAIR WITH MESH;  Surgeon: Jerryl Morin, MD;  Location: [facility] OR;  Service: General;  Laterality: N/A;   WOUND DEBRIDEMENT  03/02/2012   Procedure: DEBRIDEMENT CLOSURE/ABDOMINAL WOUND;  Surgeon: Diantha Fossa, MD;  Location: [facility] OR;  Service: General;  Laterality: N/A;   ZENKER'S DIVERTICULECTOMY  2013   Dr. Tellis Feathers    Family History  Problem Relation Age of Onset   Hypertension Mother    Pseudochol deficiency Neg Hx    Anesthesia problems Neg Hx    Hypotension Neg Hx    Malignant hyperthermia Neg Hx    Colon cancer Neg Hx     Social History:  reports that he quit smoking about 11 years ago. His smoking use included cigarettes. He started smoking about 55 years ago. He has a 66 pack-year smoking history. He has never used smokeless tobacco. He reports that he does not drink alcohol and does not use drugs.  Allergies:  Allergies  Allergen Reactions   Lorazepam  Other (See Comments)    SEVERE DELERIUM AGITATION Tolerates midazolam  and other benzo's    Medications: Scheduled:  aspirin  EC  81 mg Oral Q breakfast   azithromycin   500 mg Oral Daily   bisoprolol   2.5 mg Oral QPC supper   budeson-glycopyrrolate -formoterol   2 puff Inhalation BID   Chlorhexidine  Gluconate Cloth  6 each Topical Daily   dextromethorphan -guaiFENesin   1 tablet Oral BID   gabapentin   300 mg Oral BID   heparin   5,000 Units Subcutaneous Q8H   ipratropium-albuterol   3 mL Nebulization Q6H   irbesartan   75 mg Oral Daily   lubiprostone   24 mcg Oral Q breakfast   mesalamine   4.8 g Oral Q breakfast   mupirocin  ointment  1 Application Nasal BID   sodium chloride  flush  10 mL Intrapleural Q8H   sodium chloride  flush  3 mL Intravenous Q12H   sodium chloride  flush  3 mL Intravenous Q12H   tamsulosin   0.4 mg Oral Daily    Results for orders placed or performed during the hospital encounter of 02/04/24 (from the past 48 hours)  Basic metabolic panel     Status: Abnormal    Collection Time: 02/04/24  7:05 AM  Result Value Ref Range   Sodium 138 135 - 145 mmol/L   Potassium 4.4 3.5 - 5.1 mmol/L   Chloride 106 98 - 111 mmol/L   CO2 24 22 - 32 mmol/L   Glucose, Bld 103 (H) 70 - 99 mg/dL    Comment: Glucose reference range applies only to samples taken after fasting for at least 8 hours.   BUN 15 8 - 23 mg/dL   Creatinine, Ser 4.78 0.61 - 1.24 mg/dL   Calcium 9.5 8.9 - 10.3 mg/dL   GFR, Estimated >29 >56 mL/min    Comment: (NOTE) Calculated using the CKD-EPI Creatinine Equation (2021)    Anion gap 8 5 - 15  Comment: Performed at [facility], [address] [place], [place] [address]  Blood gas, venous (at [facility] and [facility])     Status: Abnormal   Collection Time: 02/04/24  7:05 AM  Result Value Ref Range   pH, Ven 7.34 7.25 - 7.43   pCO2, Ven 54 44 - 60 mmHg   pO2, Ven <31 (LL) 32 - 45 mmHg    Comment: CRITICAL RESULT CALLED TO, READ BACK BY AND VERIFIED WITH: ANGIE @ 0729 ON [id number] BY HENDERSON L    Bicarbonate 29.0 (H) 20.0 - 28.0 mmol/L   Acid-Base Excess 1.9 0.0 - 2.0 mmol/L   O2 Saturation 33 %   Patient temperature 36.5    Collection site RIGHT ANTECUBITAL    Drawn by [phone]     Comment: Performed at [facility], [address], [place], [place] [address]  Hepatic function panel     Status: Abnormal   Collection Time: 02/04/24  7:05 AM  Result Value Ref Range   Total Protein 8.3 (H) 6.5 - 8.1 g/dL   Albumin  4.1 3.5 - 5.0 g/dL   AST 17 15 - 41 U/L   ALT 16 0 - 44 U/L   Alkaline Phosphatase 55 38 - 126 U/L   Total Bilirubin 0.6 0.0 - 1.2 mg/dL   Bilirubin, Direct 0.2 0.0 - 0.2 mg/dL   Indirect Bilirubin 0.4 0.3 - 0.9 mg/dL    Comment: Performed at [facility], [address] [place], [place] [address]  CBC with Differential     Status: Abnormal   Collection Time: 02/04/24  7:05 AM  Result Value Ref Range   WBC 9.6 4.0 - 10.5 K/uL   RBC 6.08 (H) 4.22 - 5.81 MIL/uL   Hemoglobin 16.3 13.0 - 17.0 g/dL   HCT 62.9 52.8 - 41.3 %   MCV 83.4 80.0 -  100.0 fL   MCH 26.8 26.0 - 34.0 pg   MCHC 32.1 30.0 - 36.0 g/dL   RDW 24.4 (H) 01.0 - 27.2 %   Platelets 227 150 - 400 K/uL   nRBC 0.0 0.0 - 0.2 %   Neutrophils Relative % 68 %   Neutro Abs 6.5 1.7 - 7.7 K/uL   Lymphocytes Relative 22 %   Lymphs Abs 2.1 0.7 - 4.0 K/uL   Monocytes Relative 7 %   Monocytes Absolute 0.7 0.1 - 1.0 K/uL   Eosinophils Relative 2 %   Eosinophils Absolute 0.2 0.0 - 0.5 K/uL   Basophils Relative 1 %   Basophils Absolute 0.1 0.0 - 0.1 K/uL   Immature Granulocytes 0 %   Abs Immature Granulocytes 0.03 0.00 - 0.07 K/uL    Comment: Performed at [facility], [address], [place], [place] [address]  Brain natriuretic peptide     Status: Abnormal   Collection Time: 02/04/24  7:13 AM  Result Value Ref Range   B Natriuretic Peptide 123.0 (H) 0.0 - 100.0 pg/mL    Comment: Performed at [facility], [address] [place], [place] [address]  Resp panel by RT-PCR (RSV, Flu A&B, Covid) Anterior Nasal Swab     Status: None   Collection Time: 02/04/24  7:30 AM   Specimen: Anterior Nasal Swab  Result Value Ref Range   SARS Coronavirus 2 by RT PCR NEGATIVE NEGATIVE    Comment: (NOTE) SARS-CoV-2 target nucleic acids are NOT DETECTED.  The SARS-CoV-2 RNA is generally detectable in upper respiratory specimens during the acute phase of infection. The lowest concentration of SARS-CoV-2 viral copies  this assay can detect is 138 copies/mL. A negative result does not preclude SARS-Cov-2 infection and should not be used as the sole basis for treatment or other patient management decisions. A negative result may occur with  improper specimen collection/handling, submission of specimen other than nasopharyngeal swab, presence of viral mutation(s) within the areas targeted by this assay, and inadequate number of viral copies(<138 copies/mL). A negative result must be combined with clinical observations, patient history, and epidemiological information. The expected result is  Negative.  Fact Sheet for Patients:  BloggerCourse.com  Fact Sheet for Healthcare Providers:  SeriousBroker.it  This test is no t yet approved or cleared by the [place]  FDA and  has been authorized for detection and/or diagnosis of SARS-CoV-2 by FDA under an Emergency Use Authorization (EUA). This EUA will remain  in effect (meaning this test can be used) for the duration of the COVID-19 declaration under Section 564(b)(1) of the Act, 21 U.S.C.section 360bbb-3(b)(1), unless the authorization is terminated  or revoked sooner.       Influenza A by PCR NEGATIVE NEGATIVE   Influenza B by PCR NEGATIVE NEGATIVE    Comment: (NOTE) The Xpert Xpress SARS-CoV-2/FLU/RSV plus assay is intended as an aid in the diagnosis of influenza from Nasopharyngeal swab specimens and should not be used as a sole basis for treatment. Nasal washings and aspirates are unacceptable for Xpert Xpress SARS-CoV-2/FLU/RSV testing.  Fact Sheet for Patients: BloggerCourse.com  Fact Sheet for Healthcare Providers: SeriousBroker.it  This test is not yet approved or cleared by the [place]  FDA and has been authorized for detection and/or diagnosis of SARS-CoV-2 by FDA under an Emergency Use Authorization (EUA). This EUA will remain in effect (meaning this test can be used) for the duration of the COVID-19 declaration under Section 564(b)(1) of the Act, 21 U.S.C. section 360bbb-3(b)(1), unless the authorization is terminated or revoked.     Resp Syncytial Virus by PCR NEGATIVE NEGATIVE    Comment: (NOTE) Fact Sheet for Patients: BloggerCourse.com  Fact Sheet for Healthcare Providers: SeriousBroker.it  This test is not yet approved or cleared by the [place]  FDA and has been authorized for detection and/or diagnosis of SARS-CoV-2 by FDA under an  Emergency Use Authorization (EUA). This EUA will remain in effect (meaning this test can be used) for the duration of the COVID-19 declaration under Section 564(b)(1) of the Act, 21 U.S.C. section 360bbb-3(b)(1), unless the authorization is terminated or revoked.  Performed at [facility], [address] [place], [place] [address]   MRSA Next Gen by PCR, Nasal     Status: Abnormal   Collection Time: 02/04/24  2:02 PM   Specimen: Nasal Mucosa; Nasal Swab  Result Value Ref Range   MRSA by PCR Next Gen DETECTED (A) NOT DETECTED    Comment: CRITICAL RESULT CALLED TO, READ BACK BY AND VERIFIED WITH: RN MONICA SIXTOS [id number] BY J RAZZAK, MT (NOTE) The GeneXpert MRSA Assay (FDA approved for NASAL specimens only), is one component of a comprehensive MRSA colonization surveillance program. It is not intended to diagnose MRSA infection nor to guide or monitor treatment for MRSA infections. Test performance is not FDA approved in patients less than 77 years old. Performed at [facility], [address], [place], [place] [address]     DG CHEST PORT 1 VIEW Result Date: 02/05/2024 CLINICAL DATA:  78 year old male with bullous emphysema and spontaneous pneumothorax. EXAM: PORTABLE CHEST 1 VIEW COMPARISON:  Chest CT yesterday and earlier. FINDINGS: Portable [facility]  view at 0304 hours. Stable left chest tube position since yesterday morning. Lower lung volumes. Ongoing left pneumothorax, not significantly changed from yesterday and up to moderate by CT (please see that report) Stable cardiac size and mediastinal contours. Stable ventilation otherwise. Negative visible bowel gas.  Stable visualized osseous structures. IMPRESSION: Stable left chest tube position since yesterday morning and unchanged left pneumothorax, which was Moderate by CT yesterday. Consider up sizing tube. Lower lung volumes today.  Bullous emphysema. Electronically Signed   By: Marlise Simpers M.D.   On: 02/05/2024 04:27   CT CHEST  WO CONTRAST Result Date: 02/05/2024 CLINICAL DATA:  78 year old male with bullous emphysema and spontaneous pneumothorax. EXAM: CT CHEST WITHOUT CONTRAST TECHNIQUE: Multidetector CT imaging of the chest was performed following the standard protocol without IV contrast. RADIATION DOSE REDUCTION: This exam was performed according to the departmental dose-optimization program which includes automated exposure control, adjustment of the mA and/or kV according to patient size and/or use of iterative reconstruction technique. COMPARISON:  Chest CT 04/25/2023. Portable chest radiographs 0830 hours the same day and earlier. FINDINGS: Cardiovascular: Mild pneumothorax related mass effect on the right heart border. Overall cardiac size is stable from last year and within normal limits. No pericardial effusion. Vascular patency is not evaluated in the absence of IV contrast. Mild for age Calcified aortic atherosclerosis. Mediastinum/Nodes: Negative for mediastinal mass or lymphadenopathy in the absence of IV contrast. Lungs/Pleura: Bullous emphysema. CT last year demonstrated moderate to severe bullae along the mediastinal contours and in the lung apices more pronounced on the right at that time. Left-side pneumothorax with pigtail left chest tube in place entering the pleural space via the 4/5 left inter costal space. Moderate volume of residual pleural air especially at the left lung base (series 4, image 119). Confluent opacity in the lingula, segmental left lower lobe is probably lung collapse related atelectasis. The central airways remain patent. No superimposed pleural effusion. Right lung parenchyma appears stable from last year. Upper Abdomen: Negative visible noncontrast liver, gallbladder, spleen pancreas, right adrenal gland, right kidney, and bowel in the upper abdomen. Musculoskeletal: Small volume of chest tube related left lateral chest wall soft tissue gas. Spine degeneration, thoracic scoliosis. Stable  visualized osseous structures. IMPRESSION: 1. Bullous Emphysema (ICD10-J43.9) with Moderate-sized residual Left Pneumothorax despite pigtail chest tube in place. Associated mild mass effect on the right heart border. Left lingula and lower lobe atelectasis. No pleural effusion. 2.  Aortic Atherosclerosis (ICD10-I70.0). Electronically Signed   By: Marlise Simpers M.D.   On: 02/05/2024 04:26   DG Chest Portable 1 View Result Date: 02/04/2024 CLINICAL DATA:  78 year old male chest tube placement for left pneumothorax. EXAM: PORTABLE CHEST 1 VIEW COMPARISON:  Portable chest 0719 hours today.  Chest CT 04/25/2023. FINDINGS: Portable [facility] semi upright view at 0830 hours. Bullous emphysema demonstrated by CT last year. Pigtail left chest tube has been placed at the area of previous moderate to large left pneumothorax. A small volume of pleural air persists visible both along the medial left lung and at the left costophrenic angle. Moderately good left lung re-expansion. No mediastinal shift. Stable mediastinal contours. Stable right lung. No acute osseous abnormality identified. Negative visible bowel gas. IMPRESSION: Left chest tube placed with small volume residual left pneumothorax. Underlying Bullous Emphysema (ICD10-J43.9). Electronically Signed   By: Marlise Simpers M.D.   On: 02/04/2024 08:37   DG Chest Port 1 View Result Date: 02/04/2024 CLINICAL DATA:  Shortness of breath onset yesterday.  Chest pain. EXAM: PORTABLE  CHEST 1 VIEW COMPARISON:  PA and lateral chest 06/09/2023 FINDINGS: Left lung has partially collapsed medially with pneumothorax estimated about 60% of the left chest volume with contralateral mediastinal shift consistent with tension. Emphysematous and coarse chronic interstitial changes are again noted. There is mild biapical pleuroparenchymal scar-like opacity. There is compressive atelectasis in the left lung, no focal infiltrate on the right. There is mild cardiomegaly. No vascular congestion is seen.  Mediastinum is normally outlined. There is calcification in the transverse aorta. Mild thoracic dextroscoliosis with no new osseous finding. Osteopenia. IMPRESSION: 1. Left pneumothorax estimated about 60% of the left chest volume with contralateral mediastinal shift consistent with tension. 2. Emphysematous and chronic changes. 3. Mild cardiomegaly. 4. Aortic atherosclerosis. 5. Critical Value/emergent results were called by telephone at the time of interpretation on 02/04/2024 at 7:40 am to provider [facility] ZAMMIT , who verbally acknowledged these results. Electronically Signed   By: Denman Fischer M.D.   On: 02/04/2024 07:40    Review of Systems  Constitutional:  Negative for activity change.  HENT:  Negative for trouble swallowing and voice change.   Respiratory:  Positive for shortness of breath and wheezing.   Cardiovascular:  Negative for chest pain.  Musculoskeletal:  Positive for arthralgias and joint swelling.  Psychiatric/Behavioral:  Positive for dysphoric mood.    Blood pressure (!) 146/101, pulse 88, temperature (!) 96.8 F (36 C), temperature source Axillary, resp. rate 19, height 6' 2.5" (1.892 m), weight 95.3 kg, SpO2 93%. Physical Exam Vitals reviewed.  Constitutional:      General: He is not in acute distress.    Appearance: Normal appearance.  HENT:     Head: Normocephalic and atraumatic.  Eyes:     Extraocular Movements: Extraocular movements intact.  Cardiovascular:     Rate and Rhythm: Normal rate and regular rhythm.     Heart sounds: Murmur (2/6 systolic) heard.  Pulmonary:     Effort: Pulmonary effort is normal. No respiratory distress.     Breath sounds: Rhonchi (Faint bilaterally) present.  Abdominal:     General: There is no distension.     Palpations: Abdomen is soft.  Lymphadenopathy:     Cervical: No cervical adenopathy.  Skin:    General: Skin is warm and dry.  Neurological:     General: No focal deficit present.     Mental Status: He is alert and  oriented to person, place, and time.     Cranial Nerves: No cranial nerve deficit.     Motor: No weakness.     Assessment/Plan: Michael Ellison is a 78 year old man with a past history significant for tobacco abuse, COPD, home O2, heart murmur, mild mitral regurgitation, aspiration pneumonia, hypertension, paroxysmal SVT, ventral hernia, prostate cancer, ethanol abuse, depression, and arthritis.  Spontaneous left pneumothorax-in setting of bullous COPD.  Chest tube placed and symptoms improved.  High likelihood of recurrence without definitive management.  Best option is to proceed with VATS for bleb resection and pleural abrasion.  I discussed the postoperative procedure with Mr. Magid.  I informed him of the general nature of the procedure including the need for general anesthesia, the incisions to be used, use of drains to postoperatively, the expected hospital stay, and the overall recovery.  I informed him of the indications, risks, benefits, and alternatives.  He understands the risks include, but not limited to death, MI, DVT, PE, bleeding, possible need for transfusion, infection, prolonged air leak, cardiac arrhythmias, as well as the possibility of other unstable complications.  He will talk to his family about the procedure, but is inclined to proceed.  Heart murmur-known mild MR from echo in September 2024.  Normal LV and RV function.  [name] 02/05/2024, 9:52 AM

## 2024-02-05 NOTE — Progress Notes (Signed)
 PROGRESS NOTE    Michael Ellison  ZOX:[id number] DOB: 08-08-1946 DOA: 02/04/2024 PCP: Wyvonna Heidelberg, MD  Outpatient Specialists:     Brief Narrative:  Patient is a 78 year old male with past medical history significant for emphysema with bullae, chronic hypoxic respiratory failure on 2 to 3 L of supplemental oxygen  as needed, hypertension, GERD, PSVT and reformed cigarette smoker.  Patient presented to [facility] with acute left-sided tension pneumothorax.  Patient has had chest tube placed and transferred to [facility] for further management.  Patient has been seen by cardiothoracic team and the cardiothoracic team plans to proceed with VATS for bleb resection and pleural abrasion to avoid recurrences.  02/05/2024: Patient seen alongside patient's daughter.  No new complaints.  Patient is aware of the procedure that is planned for tomorrow.   Assessment & Plan:   Principal Problem:   Spontaneous pneumothorax Active Problems:   COPD GOLD 2   Essential hypertension   Acute on chronic respiratory failure with hypoxia (HCC)   Chronic respiratory failure with hypoxia (HCC)   Spontaneous tension pneumothorax   HTN (hypertension)   IBS (irritable colon syndrome)--C   Paroxysmal SVT (supraventricular tachycardia) (HCC)   Acute left-sided tension pneumothorax: - History of bullous COPD. - Patient is a reformed cigarette smoker. - Patient is status post chest tube placement. - Cardiothoracic surgery input is appreciated.  For VATS procedure, resection of the bullae and pleural abrasion.  COPD: - No acute exacerbation. - Continue bronchodilators.  Acute on chronic respiratory failure: - Current decompensation is secondary to tension pneumothorax. - See above documentation.  Hypertension: -Continue to optimize.  Paroxysmal supraventricular tachycardia: - Heart rate is currently controlled. - Continue bisoprolol .  Depression/anxiety: -  Stable.  BPH: - Continue Flomax .   DVT prophylaxis: Subcutaneous heparin . Code Status: Full code. Family Communication: Daughter. Disposition Plan: Patient remains inpatient.   Consultants:  Cardiothoracic surgery team.   Pulmonary and critical care team.  Procedures:  Chest tube placement. VATS procedure is planned for tomorrow.  Antimicrobials:  IV Cefazolin     Subjective: No SOB  Objective: Vitals:   02/05/24 0345 02/05/24 0738 02/05/24 0800 02/05/24 1024  BP: 124/68 (!) 141/91 (!) 146/101 (!) 150/98  Pulse: 73 81 88 86  Resp: 16 (!) 22 19 14   Temp: 97.7 F (36.5 C) (!) 96.8 F (36 C)  97.7 F (36.5 C)  TempSrc: Oral Axillary  Oral  SpO2: 94% 95% 93% 98%  Weight:      Height:        Intake/Output Summary (Last 24 hours) at 02/05/2024 1045 Last data filed at 02/05/2024 0800 Gross per 24 hour  Intake 243 ml  Output 330 ml  Net -87 ml   Filed Weights   02/04/24 0659  Weight: 95.3 kg    Examination:  General exam: Appears calm and comfortable  Respiratory system: Clear to auscultation.   Cardiovascular system: S1 & S2 heard  Gastrointestinal system: Abdomen is obese, soft and non tender  Central nervous system: Awake and alert.     Data Reviewed: I have personally reviewed following labs and imaging studies  CBC: Recent Labs  Lab 02/04/24 0705  WBC 9.6  NEUTROABS 6.5  HGB 16.3  HCT 50.7  MCV 83.4  PLT 227   Basic Metabolic Panel: Recent Labs  Lab 02/04/24 0705  NA 138  K 4.4  CL 106  CO2 24  GLUCOSE 103*  BUN 15  CREATININE 1.02  CALCIUM 9.5   GFR: Estimated  Creatinine Clearance: 72.7 mL/min (by C-G formula based on SCr of 1.02 mg/dL). Liver Function Tests: Recent Labs  Lab 02/04/24 0705  AST 17  ALT 16  ALKPHOS 55  BILITOT 0.6  PROT 8.3*  ALBUMIN  4.1   No results for input(s): "LIPASE", "AMYLASE" in the last 168 hours. No results for input(s): "AMMONIA" in the last 168 hours. Coagulation Profile: No results for  input(s): "INR", "PROTIME" in the last 168 hours. Cardiac Enzymes: No results for input(s): "CKTOTAL", "CKMB", "CKMBINDEX", "TROPONINI" in the last 168 hours. BNP (last 3 results) No results for input(s): "PROBNP" in the last 8760 hours. HbA1C: No results for input(s): "HGBA1C" in the last 72 hours. CBG: No results for input(s): "GLUCAP" in the last 168 hours. Lipid Profile: No results for input(s): "CHOL", "HDL", "LDLCALC", "TRIG", "CHOLHDL", "LDLDIRECT" in the last 72 hours. Thyroid  Function Tests: No results for input(s): "TSH", "T4TOTAL", "FREET4", "T3FREE", "THYROIDAB" in the last 72 hours. Anemia Panel: No results for input(s): "VITAMINB12", "FOLATE", "FERRITIN", "TIBC", "IRON", "RETICCTPCT" in the last 72 hours. Urine analysis:    Component Value Date/Time   COLORURINE AMBER (A) 08/28/2016 1645   APPEARANCEUR CLOUDY (A) 08/28/2016 1645   LABSPEC 1.020 08/28/2016 1645   PHURINE 5.0 08/28/2016 1645   GLUCOSEU NEGATIVE 08/28/2016 1645   HGBUR NEGATIVE 08/28/2016 1645   BILIRUBINUR SMALL (A) 08/28/2016 1645   KETONESUR NEGATIVE 08/28/2016 1645   PROTEINUR NEGATIVE 08/28/2016 1645   UROBILINOGEN 0.2 04/17/2014 1620   NITRITE NEGATIVE 08/28/2016 1645   LEUKOCYTESUR NEGATIVE 08/28/2016 1645   Sepsis Labs: @LABRCNTIP (procalcitonin:4,lacticidven:4)  ) Recent Results (from the past 240 hours)  Resp panel by RT-PCR (RSV, Flu A&B, Covid) Anterior Nasal Swab     Status: None   Collection Time: 02/04/24  7:30 AM   Specimen: Anterior Nasal Swab  Result Value Ref Range Status   SARS Coronavirus 2 by RT PCR NEGATIVE NEGATIVE Final    Comment: (NOTE) SARS-CoV-2 target nucleic acids are NOT DETECTED.  The SARS-CoV-2 RNA is generally detectable in upper respiratory specimens during the acute phase of infection. The lowest concentration of SARS-CoV-2 viral copies this assay can detect is 138 copies/mL. A negative result does not preclude SARS-Cov-2 infection and should not be used  as the sole basis for treatment or other patient management decisions. A negative result may occur with  improper specimen collection/handling, submission of specimen other than nasopharyngeal swab, presence of viral mutation(s) within the areas targeted by this assay, and inadequate number of viral copies(<138 copies/mL). A negative result must be combined with clinical observations, patient history, and epidemiological information. The expected result is Negative.  Fact Sheet for Patients:  BloggerCourse.com  Fact Sheet for Healthcare Providers:  SeriousBroker.it  This test is no t yet approved or cleared by the [place]  FDA and  has been authorized for detection and/or diagnosis of SARS-CoV-2 by FDA under an Emergency Use Authorization (EUA). This EUA will remain  in effect (meaning this test can be used) for the duration of the COVID-19 declaration under Section 564(b)(1) of the Act, 21 U.S.C.section 360bbb-3(b)(1), unless the authorization is terminated  or revoked sooner.       Influenza A by PCR NEGATIVE NEGATIVE Final   Influenza B by PCR NEGATIVE NEGATIVE Final    Comment: (NOTE) The Xpert Xpress SARS-CoV-2/FLU/RSV plus assay is intended as an aid in the diagnosis of influenza from Nasopharyngeal swab specimens and should not be used as a sole basis for treatment. Nasal washings and aspirates are unacceptable for Xpert  Xpress SARS-CoV-2/FLU/RSV testing.  Fact Sheet for Patients: BloggerCourse.com  Fact Sheet for Healthcare Providers: SeriousBroker.it  This test is not yet approved or cleared by the [place]  FDA and has been authorized for detection and/or diagnosis of SARS-CoV-2 by FDA under an Emergency Use Authorization (EUA). This EUA will remain in effect (meaning this test can be used) for the duration of the COVID-19 declaration under Section 564(b)(1)  of the Act, 21 U.S.C. section 360bbb-3(b)(1), unless the authorization is terminated or revoked.     Resp Syncytial Virus by PCR NEGATIVE NEGATIVE Final    Comment: (NOTE) Fact Sheet for Patients: BloggerCourse.com  Fact Sheet for Healthcare Providers: SeriousBroker.it  This test is not yet approved or cleared by the [place]  FDA and has been authorized for detection and/or diagnosis of SARS-CoV-2 by FDA under an Emergency Use Authorization (EUA). This EUA will remain in effect (meaning this test can be used) for the duration of the COVID-19 declaration under Section 564(b)(1) of the Act, 21 U.S.C. section 360bbb-3(b)(1), unless the authorization is terminated or revoked.  Performed at [facility], [address], [place], [place] [address]   MRSA Next Gen by PCR, Nasal     Status: Abnormal   Collection Time: 02/04/24  2:02 PM   Specimen: Nasal Mucosa; Nasal Swab  Result Value Ref Range Status   MRSA by PCR Next Gen DETECTED (A) NOT DETECTED Final    Comment: CRITICAL RESULT CALLED TO, READ BACK BY AND VERIFIED WITH: RN MONICA SIXTOS [id number] BY J RAZZAK, MT (NOTE) The GeneXpert MRSA Assay (FDA approved for NASAL specimens only), is one component of a comprehensive MRSA colonization surveillance program. It is not intended to diagnose MRSA infection nor to guide or monitor treatment for MRSA infections. Test performance is not FDA approved in patients less than 27 years old. Performed at [facility], [address], [place], [place] [address]          Radiology Studies: DG CHEST PORT 1 VIEW Result Date: 02/05/2024 CLINICAL DATA:  78 year old male with bullous emphysema and spontaneous pneumothorax. EXAM: PORTABLE CHEST 1 VIEW COMPARISON:  Chest CT yesterday and earlier. FINDINGS: Portable AP view at 0304 hours. Stable left chest tube position since yesterday morning. Lower lung volumes. Ongoing left  pneumothorax, not significantly changed from yesterday and up to moderate by CT (please see that report) Stable cardiac size and mediastinal contours. Stable ventilation otherwise. Negative visible bowel gas.  Stable visualized osseous structures. IMPRESSION: Stable left chest tube position since yesterday morning and unchanged left pneumothorax, which was Moderate by CT yesterday. Consider up sizing tube. Lower lung volumes today.  Bullous emphysema. Electronically Signed   By: Marlise Simpers M.D.   On: 02/05/2024 04:27   CT CHEST WO CONTRAST Result Date: 02/05/2024 CLINICAL DATA:  78 year old male with bullous emphysema and spontaneous pneumothorax. EXAM: CT CHEST WITHOUT CONTRAST TECHNIQUE: Multidetector CT imaging of the chest was performed following the standard protocol without IV contrast. RADIATION DOSE REDUCTION: This exam was performed according to the departmental dose-optimization program which includes automated exposure control, adjustment of the mA and/or kV according to patient size and/or use of iterative reconstruction technique. COMPARISON:  Chest CT 04/25/2023. Portable chest radiographs 0830 hours the same day and earlier. FINDINGS: Cardiovascular: Mild pneumothorax related mass effect on the right heart border. Overall cardiac size is stable from last year and within normal limits. No pericardial effusion. Vascular patency is not evaluated in the absence of IV contrast. Mild for age  Calcified aortic atherosclerosis. Mediastinum/Nodes: Negative for mediastinal mass or lymphadenopathy in the absence of IV contrast. Lungs/Pleura: Bullous emphysema. CT last year demonstrated moderate to severe bullae along the mediastinal contours and in the lung apices more pronounced on the right at that time. Left-side pneumothorax with pigtail left chest tube in place entering the pleural space via the 4/5 left inter costal space. Moderate volume of residual pleural air especially at the left lung base (series 4,  image 119). Confluent opacity in the lingula, segmental left lower lobe is probably lung collapse related atelectasis. The central airways remain patent. No superimposed pleural effusion. Right lung parenchyma appears stable from last year. Upper Abdomen: Negative visible noncontrast liver, gallbladder, spleen pancreas, right adrenal gland, right kidney, and bowel in the upper abdomen. Musculoskeletal: Small volume of chest tube related left lateral chest wall soft tissue gas. Spine degeneration, thoracic scoliosis. Stable visualized osseous structures. IMPRESSION: 1. Bullous Emphysema (ICD10-J43.9) with Moderate-sized residual Left Pneumothorax despite pigtail chest tube in place. Associated mild mass effect on the right heart border. Left lingula and lower lobe atelectasis. No pleural effusion. 2.  Aortic Atherosclerosis (ICD10-I70.0). Electronically Signed   By: Marlise Simpers M.D.   On: 02/05/2024 04:26   DG Chest Portable 1 View Result Date: 02/04/2024 CLINICAL DATA:  77 year old male chest tube placement for left pneumothorax. EXAM: PORTABLE CHEST 1 VIEW COMPARISON:  Portable chest 0719 hours today.  Chest CT 04/25/2023. FINDINGS: Portable AP semi upright view at 0830 hours. Bullous emphysema demonstrated by CT last year. Pigtail left chest tube has been placed at the area of previous moderate to large left pneumothorax. A small volume of pleural air persists visible both along the medial left lung and at the left costophrenic angle. Moderately good left lung re-expansion. No mediastinal shift. Stable mediastinal contours. Stable right lung. No acute osseous abnormality identified. Negative visible bowel gas. IMPRESSION: Left chest tube placed with small volume residual left pneumothorax. Underlying Bullous Emphysema (ICD10-J43.9). Electronically Signed   By: Marlise Simpers M.D.   On: 02/04/2024 08:37   DG Chest Port 1 View Result Date: 02/04/2024 CLINICAL DATA:  Shortness of breath onset yesterday.  Chest pain. EXAM:  PORTABLE CHEST 1 VIEW COMPARISON:  PA and lateral chest 06/09/2023 FINDINGS: Left lung has partially collapsed medially with pneumothorax estimated about 60% of the left chest volume with contralateral mediastinal shift consistent with tension. Emphysematous and coarse chronic interstitial changes are again noted. There is mild biapical pleuroparenchymal scar-like opacity. There is compressive atelectasis in the left lung, no focal infiltrate on the right. There is mild cardiomegaly. No vascular congestion is seen. Mediastinum is normally outlined. There is calcification in the transverse aorta. Mild thoracic dextroscoliosis with no new osseous finding. Osteopenia. IMPRESSION: 1. Left pneumothorax estimated about 60% of the left chest volume with contralateral mediastinal shift consistent with tension. 2. Emphysematous and chronic changes. 3. Mild cardiomegaly. 4. Aortic atherosclerosis. 5. Critical Value/emergent results were called by telephone at the time of interpretation on 02/04/2024 at 7:40 am to provider [facility] ZAMMIT , who verbally acknowledged these results. Electronically Signed   By: Denman Fischer M.D.   On: 02/04/2024 07:40        Scheduled Meds:  aspirin  EC  81 mg Oral Q breakfast   azithromycin   500 mg Oral Daily   bisoprolol   2.5 mg Oral QPC supper   budeson-glycopyrrolate -formoterol   2 puff Inhalation BID   Chlorhexidine  Gluconate Cloth  6 each Topical Daily   dextromethorphan -guaiFENesin   1 tablet Oral BID  gabapentin   300 mg Oral BID   heparin   5,000 Units Subcutaneous Q8H   ipratropium-albuterol   3 mL Nebulization Q6H   irbesartan   75 mg Oral Daily   lubiprostone   24 mcg Oral Q breakfast   mesalamine   4.8 g Oral Q breakfast   mupirocin  ointment  1 Application Nasal BID   sodium chloride  flush  10 mL Intrapleural Q8H   sodium chloride  flush  3 mL Intravenous Q12H   sodium chloride  flush  3 mL Intravenous Q12H   tamsulosin   0.4 mg Oral Daily   Continuous Infusions:    LOS: 1 day    Time spent: 35 Minutes.    Fonnie Iba, MD  [facility] Pager #: [phone] 7PM-7AM contact night coverage as above

## 2024-02-05 NOTE — Plan of Care (Signed)
  Problem: Education: Goal: Knowledge of General Education information will improve Description: Including pain rating scale, medication(s)/side effects and non-pharmacologic comfort measures Outcome: Progressing   Problem: Clinical Measurements: Goal: Respiratory complications will improve Outcome: Progressing   Problem: Activity: Goal: Risk for activity intolerance will decrease Outcome: Progressing   Problem: Skin Integrity: Goal: Risk for impaired skin integrity will decrease Outcome: Progressing   

## 2024-02-05 NOTE — Progress Notes (Signed)
 Pt placed on Room Air@ 1250. RT obtain ABG @ 1323. ABG sent to lab, lab notified.

## 2024-02-06 ENCOUNTER — Other Ambulatory Visit: Payer: Self-pay

## 2024-02-06 ENCOUNTER — Inpatient Hospital Stay (HOSPITAL_COMMUNITY): Admitting: Certified Registered Nurse Anesthetist

## 2024-02-06 ENCOUNTER — Inpatient Hospital Stay (HOSPITAL_COMMUNITY)

## 2024-02-06 ENCOUNTER — Encounter (HOSPITAL_COMMUNITY)
Admission: EM | Disposition: A | Discharge status: Home Health | Payer: Self-pay | Source: Home / Self Care | Attending: Internal Medicine

## 2024-02-06 ENCOUNTER — Encounter (HOSPITAL_COMMUNITY): Payer: Self-pay | Admitting: Family Medicine

## 2024-02-06 DIAGNOSIS — J9383 Other pneumothorax: Secondary | ICD-10-CM | POA: Diagnosis not present

## 2024-02-06 DIAGNOSIS — N189 Chronic kidney disease, unspecified: Secondary | ICD-10-CM

## 2024-02-06 DIAGNOSIS — I129 Hypertensive chronic kidney disease with stage 1 through stage 4 chronic kidney disease, or unspecified chronic kidney disease: Secondary | ICD-10-CM

## 2024-02-06 DIAGNOSIS — J9311 Primary spontaneous pneumothorax: Secondary | ICD-10-CM | POA: Diagnosis not present

## 2024-02-06 LAB — POCT I-STAT 7, (LYTES, BLD GAS, ICA,H+H)
Acid-base deficit: 2 mmol/L (ref 0.0–2.0)
Bicarbonate: 24.6 mmol/L (ref 20.0–28.0)
Calcium, Ion: 1.2 mmol/L (ref 1.15–1.40)
HCT: 39 % (ref 39.0–52.0)
Hemoglobin: 13.3 g/dL (ref 13.0–17.0)
O2 Saturation: 92 %
Potassium: 4.8 mmol/L (ref 3.5–5.1)
Sodium: 139 mmol/L (ref 135–145)
TCO2: 26 mmol/L (ref 22–32)
pCO2 arterial: 50.3 mmHg — ABNORMAL HIGH (ref 32–48)
pH, Arterial: 7.298 — ABNORMAL LOW (ref 7.35–7.45)
pO2, Arterial: 72 mmHg — ABNORMAL LOW (ref 83–108)

## 2024-02-06 LAB — PREPARE RBC (CROSSMATCH)

## 2024-02-06 SURGERY — WEDGE RESECTION, LUNG, ROBOT-ASSISTED, THORACOSCOPIC
Anesthesia: General | Site: Chest | Laterality: Left

## 2024-02-06 MED ORDER — IPRATROPIUM-ALBUTEROL 0.5-2.5 (3) MG/3ML IN SOLN
3.0000 mL | Freq: Three times a day (TID) | RESPIRATORY_TRACT | Status: DC
  Administered 2024-02-07: 3 mL via RESPIRATORY_TRACT
  Filled 2024-02-06: qty 3

## 2024-02-06 MED ORDER — DEXAMETHASONE SODIUM PHOSPHATE 10 MG/ML IJ SOLN
INTRAMUSCULAR | Status: DC | PRN
Start: 2024-02-06 — End: 2024-02-06
  Administered 2024-02-06: 8 mg via INTRAVENOUS

## 2024-02-06 MED ORDER — BISACODYL 5 MG PO TBEC
10.0000 mg | DELAYED_RELEASE_TABLET | Freq: Every day | ORAL | Status: DC
  Administered 2024-02-07 – 2024-02-10 (×4): 10 mg via ORAL
  Filled 2024-02-06 (×5): qty 2

## 2024-02-06 MED ORDER — FENTANYL CITRATE (PF) 250 MCG/5ML IJ SOLN
INTRAMUSCULAR | Status: AC
Start: 2024-02-06 — End: ?
  Filled 2024-02-06: qty 5

## 2024-02-06 MED ORDER — ACETAMINOPHEN 10 MG/ML IV SOLN: 1000.0000 mg | Freq: Once | INTRAVENOUS | Status: DC | PRN

## 2024-02-06 MED ORDER — LACTATED RINGERS IV SOLN: INTRAVENOUS | Status: DC | PRN

## 2024-02-06 MED ORDER — PROPOFOL 10 MG/ML IV BOLUS
INTRAVENOUS | Status: AC
  Filled 2024-02-06: qty 20

## 2024-02-06 MED ORDER — SODIUM CHLORIDE 0.45 % IV SOLN: INTRAVENOUS | Status: DC

## 2024-02-06 MED ORDER — ONDANSETRON HCL 4 MG/2ML IJ SOLN
INTRAMUSCULAR | Status: DC | PRN
  Administered 2024-02-06: 4 mg via INTRAVENOUS

## 2024-02-06 MED ORDER — SENNOSIDES-DOCUSATE SODIUM 8.6-50 MG PO TABS
1.0000 | ORAL_TABLET | Freq: Every day | ORAL | Status: DC
  Administered 2024-02-06: 1 via ORAL
  Filled 2024-02-06: qty 1

## 2024-02-06 MED ORDER — LACTATED RINGERS IV SOLN: INTRAVENOUS | Status: DC

## 2024-02-06 MED ORDER — BUPIVACAINE HCL (PF) 0.5 % IJ SOLN
INTRAMUSCULAR | Status: AC
  Filled 2024-02-06: qty 30

## 2024-02-06 MED ORDER — PHENYLEPHRINE HCL-NACL 20-0.9 MG/250ML-% IV SOLN
INTRAVENOUS | Status: DC | PRN
  Administered 2024-02-06: 30 ug/min via INTRAVENOUS

## 2024-02-06 MED ORDER — CHLORHEXIDINE GLUCONATE 0.12 % MT SOLN
15.0000 mL | Freq: Once | OROMUCOSAL | Status: AC
  Administered 2024-02-06: 15 mL via OROMUCOSAL

## 2024-02-06 MED ORDER — SODIUM CHLORIDE (PF) 0.9 % IJ SOLN
INTRAMUSCULAR | Status: AC
  Filled 2024-02-06: qty 50

## 2024-02-06 MED ORDER — OXYCODONE HCL 5 MG PO TABS
5.0000 mg | ORAL_TABLET | ORAL | Status: DC | PRN
  Administered 2024-02-06 – 2024-02-10 (×6): 10 mg via ORAL
  Filled 2024-02-06 (×6): qty 2

## 2024-02-06 MED ORDER — CEFAZOLIN SODIUM-DEXTROSE 2-4 GM/100ML-% IV SOLN
2.0000 g | Freq: Three times a day (TID) | INTRAVENOUS | Status: AC
  Administered 2024-02-06 (×2): 2 g via INTRAVENOUS
  Filled 2024-02-06 (×2): qty 100

## 2024-02-06 MED ORDER — LIDOCAINE 2% (20 MG/ML) 5 ML SYRINGE
INTRAMUSCULAR | Status: DC | PRN
  Administered 2024-02-06: 100 mg via INTRAVENOUS

## 2024-02-06 MED ORDER — ALBUMIN HUMAN 5 % IV SOLN: INTRAVENOUS | Status: DC | PRN

## 2024-02-06 MED ORDER — OXYCODONE HCL 5 MG PO TABS: 5.0000 mg | ORAL_TABLET | Freq: Once | ORAL | Status: DC | PRN

## 2024-02-06 MED ORDER — FENTANYL CITRATE (PF) 250 MCG/5ML IJ SOLN
INTRAMUSCULAR | Status: DC | PRN
  Administered 2024-02-06: 100 ug via INTRAVENOUS
  Administered 2024-02-06 (×6): 50 ug via INTRAVENOUS

## 2024-02-06 MED ORDER — ORAL CARE MOUTH RINSE: 15.0000 mL | Freq: Once | OROMUCOSAL | Status: AC

## 2024-02-06 MED ORDER — OXYCODONE HCL 5 MG/5ML PO SOLN: 5.0000 mg | Freq: Once | ORAL | Status: DC | PRN

## 2024-02-06 MED ORDER — FENTANYL CITRATE (PF) 250 MCG/5ML IJ SOLN
INTRAMUSCULAR | Status: AC
  Filled 2024-02-06: qty 5

## 2024-02-06 MED ORDER — ROCURONIUM BROMIDE 10 MG/ML (PF) SYRINGE
PREFILLED_SYRINGE | INTRAVENOUS | Status: DC | PRN
  Administered 2024-02-06: 100 mg via INTRAVENOUS
  Administered 2024-02-06: 20 mg via INTRAVENOUS
  Administered 2024-02-06: 10 mg via INTRAVENOUS
  Administered 2024-02-06 (×2): 20 mg via INTRAVENOUS

## 2024-02-06 MED ORDER — FENTANYL CITRATE (PF) 100 MCG/2ML IJ SOLN
25.0000 ug | INTRAMUSCULAR | Status: DC | PRN
  Administered 2024-02-06 (×2): 25 ug via INTRAVENOUS

## 2024-02-06 MED ORDER — SUGAMMADEX SODIUM 200 MG/2ML IV SOLN
INTRAVENOUS | Status: DC | PRN
  Administered 2024-02-06: 200 mg via INTRAVENOUS

## 2024-02-06 MED ORDER — FENTANYL CITRATE (PF) 100 MCG/2ML IJ SOLN
INTRAMUSCULAR | Status: AC
  Administered 2024-02-06: 25 ug via INTRAVENOUS
  Filled 2024-02-06: qty 2

## 2024-02-06 MED ORDER — 0.9 % SODIUM CHLORIDE (POUR BTL) OPTIME
TOPICAL | Status: DC | PRN
  Administered 2024-02-06: 2000 mL

## 2024-02-06 MED ORDER — ONDANSETRON HCL 4 MG/2ML IJ SOLN: 4.0000 mg | Freq: Once | INTRAMUSCULAR | Status: DC | PRN

## 2024-02-06 MED ORDER — BUPIVACAINE LIPOSOME 1.3 % IJ SUSP
INTRAMUSCULAR | Status: AC
  Filled 2024-02-06: qty 20

## 2024-02-06 MED ORDER — PHENYLEPHRINE 80 MCG/ML (10ML) SYRINGE FOR IV PUSH (FOR BLOOD PRESSURE SUPPORT)
PREFILLED_SYRINGE | INTRAVENOUS | Status: DC | PRN
  Administered 2024-02-06 (×3): 80 ug via INTRAVENOUS

## 2024-02-06 MED ORDER — PROPOFOL 10 MG/ML IV BOLUS
INTRAVENOUS | Status: DC | PRN
Start: 2024-02-06 — End: 2024-02-06
  Administered 2024-02-06: 100 mg via INTRAVENOUS

## 2024-02-06 MED ORDER — PANTOPRAZOLE SODIUM 40 MG PO TBEC
40.0000 mg | DELAYED_RELEASE_TABLET | Freq: Every day | ORAL | Status: DC
  Administered 2024-02-07 – 2024-02-10 (×4): 40 mg via ORAL
  Filled 2024-02-06 (×5): qty 1

## 2024-02-06 MED ORDER — SODIUM CHLORIDE 0.9 % IR SOLN
Status: DC | PRN
  Administered 2024-02-06: 2000 mL

## 2024-02-06 SURGICAL SUPPLY — 71 items
APPLICATOR COTTON TIP 6 STRL (MISCELLANEOUS) IMPLANT
APPLICATOR COTTON TIP 6IN STRL (MISCELLANEOUS) ×1 IMPLANT
BLADE CLIPPER SURG (BLADE) ×1 IMPLANT
CANISTER SUCT 3000ML PPV (MISCELLANEOUS) ×2 IMPLANT
CANNULA REDUCER 12-8 DVNC XI (CANNULA) IMPLANT
CATH THORACIC 28FR (CATHETERS) IMPLANT
CATH THORACIC 36FR (CATHETERS) IMPLANT
CATH THORACIC 36FR RT ANG (CATHETERS) IMPLANT
CLIP TI MEDIUM 6 (CLIP) ×1 IMPLANT
CNTNR URN SCR LID CUP LEK RST (MISCELLANEOUS) ×2 IMPLANT
CONN ST 1/4X3/8 BEN (MISCELLANEOUS) IMPLANT
CONN Y 3/8X3/8X3/8 BEN (MISCELLANEOUS) IMPLANT
COVER SURGICAL LIGHT HANDLE (MISCELLANEOUS) ×1 IMPLANT
DEFOGGER SCOPE WARMER CLEARIFY (MISCELLANEOUS) IMPLANT
DERMABOND ADVANCED .7 DNX12 (GAUZE/BANDAGES/DRESSINGS) ×1 IMPLANT
DRAIN CHANNEL 28F RND 3/8 FF (WOUND CARE) IMPLANT
DRAIN CHANNEL 32F RND 10.7 FF (WOUND CARE) IMPLANT
DRAPE ARM DVNC X/XI (DISPOSABLE) IMPLANT
DRAPE COLUMN DVNC XI (DISPOSABLE) IMPLANT
DRAPE CV SPLIT W-CLR ANES SCRN (DRAPES) ×1 IMPLANT
DRAPE SURG ORHT 6 SPLT 77X108 (DRAPES) ×1 IMPLANT
ELECT BLADE 6.5 EXT (BLADE) ×1 IMPLANT
ELECTRODE REM PT RTRN 9FT ADLT (ELECTROSURGICAL) ×1 IMPLANT
FORCEPS BPLR FENES DVNC XI (FORCEP) IMPLANT
FORCEPS BPLR LNG DVNC XI (INSTRUMENTS) IMPLANT
GAUZE KITTNER 4X5 RF (MISCELLANEOUS) IMPLANT
GAUZE SPONGE 4X4 12PLY STRL (GAUZE/BANDAGES/DRESSINGS) IMPLANT
GAUZE XEROFORM 1X8 LF (GAUZE/BANDAGES/DRESSINGS) IMPLANT
GLOVE SS BIOGEL STRL SZ 7.5 (GLOVE) ×1 IMPLANT
GOWN STRL REUS W/ TWL LRG LVL3 (GOWN DISPOSABLE) ×2 IMPLANT
GOWN STRL REUS W/ TWL XL LVL3 (GOWN DISPOSABLE) ×2 IMPLANT
HEMOSTAT SURGICEL 2X14 (HEMOSTASIS) IMPLANT
IRRIGATION STRYKERFLOW (MISCELLANEOUS) IMPLANT
IRRIGATOR SUCT 8 DISP DVNC XI (IRRIGATION / IRRIGATOR) IMPLANT
KIT BASIN OR (CUSTOM PROCEDURE TRAY) ×1 IMPLANT
KIT TURNOVER KIT B (KITS) ×1 IMPLANT
NDL HYPO 25GX1X1/2 BEV (NEEDLE) IMPLANT
NEEDLE HYPO 25GX1X1/2 BEV (NEEDLE) ×1 IMPLANT
NS IRRIG 1000ML POUR BTL (IV SOLUTION) ×3 IMPLANT
PACK CHEST (CUSTOM PROCEDURE TRAY) ×1 IMPLANT
PAD ARMBOARD POSITIONER FOAM (MISCELLANEOUS) ×2 IMPLANT
RELOAD EGIA 60 MED/THCK PURPLE (STAPLE) ×1 IMPLANT
RELOAD STAPLE 45 3.5 BLU DVNC (STAPLE) IMPLANT
RELOAD STAPLE 45 4.1 GRN THCK (STAPLE) IMPLANT
RELOAD STAPLE 45 4.3 GRN DVNC (STAPLE) IMPLANT
RELOAD STAPLE 45 4.6 BLK DVNC (STAPLE) IMPLANT
RELOAD STAPLE 60 MED/THCK ART (STAPLE) IMPLANT
RELOAD STAPLER 3.5X45 BLU DVNC (STAPLE) ×1 IMPLANT
RELOAD STAPLER 4.3X45 GRN DVNC (STAPLE) ×8 IMPLANT
RELOAD STAPLER 45 4.6 BLK DVNC (STAPLE) ×3 IMPLANT
SEAL UNIV 5-12 XI (MISCELLANEOUS) IMPLANT
SOL ANTI FOG 6CC (MISCELLANEOUS) ×1 IMPLANT
SPONGE TONSIL 1 RF SGL (DISPOSABLE) ×1 IMPLANT
STAPLE RELOAD 45 GRN (STAPLE) ×3 IMPLANT
STAPLER 45 SUREFORM DVNC (STAPLE) IMPLANT
STAPLER ENDO GIA 12 SHRT THIN (STAPLE) IMPLANT
STAPLER ENDO GIA 12MM SHORT (STAPLE) ×1 IMPLANT
SUT SILK 1 MH (SUTURE) ×2 IMPLANT
SUT VIC AB 1 CTX 27 (SUTURE) ×1 IMPLANT
SUT VIC AB 2-0 CTX 36 (SUTURE) ×2 IMPLANT
SUT VIC AB 3-0 X1 27 (SUTURE) ×1 IMPLANT
SUT VICRYL 0 TIES 12 18 (SUTURE) IMPLANT
SUT VICRYL 0 UR6 27IN ABS (SUTURE) IMPLANT
SYR 20ML LL LF (SYRINGE) ×1 IMPLANT
SYSTEM SAHARA CHEST DRAIN ATS (WOUND CARE) ×1 IMPLANT
TAPE CLOTH SURG 4X10 WHT LF (GAUZE/BANDAGES/DRESSINGS) IMPLANT
TOWEL GREEN STERILE (TOWEL DISPOSABLE) ×1 IMPLANT
TOWEL GREEN STERILE FF (TOWEL DISPOSABLE) ×1 IMPLANT
TRAY FOLEY MTR SLVR 16FR STAT (SET/KITS/TRAYS/PACK) ×1 IMPLANT
TROCAR Z-THREAD BLADED 5X100MM (TROCAR) IMPLANT
WATER STERILE IRR 1000ML POUR (IV SOLUTION) ×2 IMPLANT

## 2024-02-06 NOTE — TOC Initial Note (Signed)
 Transition of Care [facility]) - Initial/Assessment Note    Patient Details  Name: Michael Ellison MRN: [id number] Date of Birth: Nov 23, 1945  [facility] CM/SW Contact:    Juliane Och, LCSW Phone Number: 02/06/2024, 4:33 PM  Clinical Narrative:                  4:33 PM CSW introduced self and role to patient at bedside. Patient's two daughters and grandson were also present at bedside. Patient consented CSW to speak in front of family members. Patient confirmed he receives home oxygen  (approximately 2-4L nasal cannula) but no other DME. Patient confirmed HH/SNF history ([facility]; [facility]). Patient confirmed he resides at home alone and that family could provide transportation and home support if needed upon discharge.  Expected Discharge Plan: Home/Self Care Barriers to Discharge: Continued Medical Work up   Patient Goals and CMS Choice Patient states their goals for this hospitalization and ongoing recovery are:: to return home          Expected Discharge Plan and Services       Living arrangements for the past 2 months: Single Family Home                                      Prior Living Arrangements/Services Living arrangements for the past 2 months: Single Family Home Lives with:: Self Patient language and need for interpreter reviewed:: Yes Do you feel safe going back to the place where you live?: Yes            Criminal Activity/Legal Involvement Pertinent to Current Situation/Hospitalization: No - Comment as needed  Activities of Daily Living   ADL Screening (condition at time of admission) Independently performs ADLs?: Yes (appropriate for developmental age) Is the patient deaf or have difficulty hearing?: No Does the patient have difficulty seeing, even when wearing glasses/contacts?: No Does the patient have difficulty concentrating, remembering, or making decisions?: No  Permission  Sought/Granted Permission sought to share information with : Family Supports Permission granted to share information with : No (Contact information on chart)  Share Information with NAME: [name] Evans     Permission granted to share info w Relationship: Daughter  Permission granted to share info w Contact Information: [phone]  Emotional Assessment Appearance:: Appears stated age Attitude/Demeanor/Rapport: Engaged Affect (typically observed): Accepting, Adaptable, Pleasant, Calm, Stable, Appropriate Orientation: : Oriented to Situation, Oriented to  Time, Oriented to Place, Oriented to Self Alcohol / Substance Use: Not Applicable Psych Involvement: No (comment)  Admission diagnosis:  Spontaneous pneumothorax [J93.83] Tension pneumothorax [J93.0] Patient Active Problem List   Diagnosis Date Noted   Spontaneous tension pneumothorax 02/04/2024   Spontaneous pneumothorax 02/04/2024   HTN (hypertension) 02/04/2024   IBS (irritable colon syndrome)--C 02/04/2024   Paroxysmal SVT (supraventricular tachycardia) (HCC) 02/04/2024   Fall 05/31/2023   Right sided abdominal pain 05/31/2023   Rectal bleeding 05/20/2023   Constipation 05/20/2023   Abdominal distention 04/26/2023   H/O adenomatous polyp of colon 07/11/2019   Non-recurrent unilateral inguinal hernia without obstruction or gangrene    Ventral hernia without obstruction or gangrene 08/26/2016   Obstipation 05/05/2016   Encounter for screening colonoscopy 05/05/2016   SOB (shortness of breath) 03/26/2015   Chronic respiratory failure with hypoxia (HCC) 03/26/2015   Depression    Essential hypertension 04/17/2014   Acute on chronic respiratory failure with hypoxia (HCC) 04/17/2014  Recurrent ventral hernia 03/19/2014   Anemia 01/03/2013   Incisional hernia s/p open repair with biologic mesh 12/25/2012 11/21/2012   COPD GOLD 2 06/23/2012   Alcohol abuse 03/01/2012   PCP:  Wyvonna Heidelberg, MD Pharmacy:   [facility] - [place], [place] - [address] [place] [address] Phone: [phone] Fax: [phone]     Social Drivers of Health (SDOH) Social History: SDOH Screenings   Food Insecurity: No Food Insecurity (02/04/2024)  Housing: Low Risk  (02/04/2024)  Transportation Needs: No Transportation Needs (02/04/2024)  Utilities: Not At Risk (02/04/2024)  Depression (PHQ2-9): Low Risk  (12/14/2018)  Social Connections: Moderately Integrated (02/04/2024)  Tobacco Use: Medium Risk (02/06/2024)   SDOH Interventions: Social Connections Interventions: Intervention Not Indicated   Readmission Risk Interventions     No data to display

## 2024-02-06 NOTE — Transfer of Care (Signed)
 Immediate Anesthesia Transfer of Care Note  Patient: Michael Ellison  Procedure(s) Performed: WEDGE RESECTION, LUNG, ROBOT-ASSISTED, THORACOSCOPIC, BLEB RESECTION (Left: Chest)  Patient Location: PACU  Anesthesia Type:General  Level of Consciousness: drowsy and patient cooperative  Airway & Oxygen  Therapy: Patient Spontanous Breathing and Patient connected to face mask oxygen   Post-op Assessment: Report given to RN, Post -op Vital signs reviewed and stable, and Patient moving all extremities X 4  Post vital signs: Reviewed and stable  Last Vitals:  Vitals Value Taken Time  BP 127/72 02/06/24 1145  Temp    Pulse 75 02/06/24 1149  Resp 17 02/06/24 1149  SpO2 98 % 02/06/24 1149  Vitals shown include unfiled device data.  Last Pain:  Vitals:   02/06/24 0703  TempSrc:   PainSc: 0-No pain         Complications: No notable events documented.

## 2024-02-06 NOTE — Progress Notes (Signed)
 PROGRESS NOTE    Michael Ellison  KVQ:[id number] DOB: [birth year] DOA: 02/04/2024 PCP: Wyvonna Heidelberg, MD  Outpatient Specialists:     Brief Narrative:  Patient is a 78 year old male with past medical history significant for emphysema with bullae, chronic hypoxic respiratory failure on 2 to 3 L of supplemental oxygen  as needed, hypertension, GERD, PSVT and reformed cigarette smoker.  Patient presented to [facility] with acute left-sided tension pneumothorax.  Patient has had chest tube placed and transferred to [facility] for further management.  Patient has been seen by cardiothoracic team and the cardiothoracic team plans to proceed with VATS for bleb resection and pleural abrasion to avoid recurrences.  02/06/2024: Patient underwent surgery today (Robotic Assisted Right Video Assisted Thoracoscopy - Wedge resection bleb RUL x 4).  Seen at the PACU.      Assessment & Plan:   Principal Problem:   Spontaneous pneumothorax Active Problems:   COPD GOLD 2   Essential hypertension   Acute on chronic respiratory failure with hypoxia (HCC)   Chronic respiratory failure with hypoxia (HCC)   Spontaneous tension pneumothorax   HTN (hypertension)   IBS (irritable colon syndrome)--C   Paroxysmal SVT (supraventricular tachycardia) (HCC)   Acute left-sided tension pneumothorax: - History of bullous COPD. - Patient is a reformed cigarette smoker. - Patient is status post chest tube placement. - Cardiothoracic surgery input is appreciated.   - Patient underwent VATS with wedge resection of right upper lobe blebs today.   - Postop care as per cardiothoracic team.  COPD: - No acute exacerbation. - Continue bronchodilators.  Acute on chronic respiratory failure: - Current decompensation is secondary to tension pneumothorax. - See above documentation. - Resolved with chest tube placement.  Hypertension: -Continue to optimize.  Paroxysmal supraventricular  tachycardia: - Heart rate is currently controlled. - Continue bisoprolol .  Depression/anxiety: - Stable.  BPH: - Continue Flomax .   DVT prophylaxis: Subcutaneous heparin . Code Status: Full code. Family Communication: Daughter. Disposition Plan: Patient remains inpatient.   Consultants:  Cardiothoracic surgery team.   Pulmonary and critical care team.  Procedures:  Chest tube placement. VATS procedure.  Antimicrobials:  IV Cefazolin     Subjective: No SOB  Objective: Vitals:   02/06/24 1145 02/06/24 1200 02/06/24 1300 02/06/24 1401  BP: 127/72 126/70  122/78  Pulse: 71 73  77  Resp: 17 (!) 30  (!) 23  Temp: 97.9 F (36.6 C)  98.1 F (36.7 C) 97.7 F (36.5 C)  TempSrc:    Oral  SpO2: (!) 88% 97%  92%  Weight:      Height:        Intake/Output Summary (Last 24 hours) at 02/06/2024 1459 Last data filed at 02/06/2024 1300 Gross per 24 hour  Intake 1555 ml  Output 2105 ml  Net -550 ml   Filed Weights   02/04/24 0659  Weight: 95.3 kg    Examination:  General exam: Appears calm and comfortable  Respiratory system: Clear to auscultation.   Cardiovascular system: S1 & S2 heard  Gastrointestinal system: Abdomen is obese, soft and non tender  Central nervous system: Awake and alert.     Data Reviewed: I have personally reviewed following labs and imaging studies  CBC: Recent Labs  Lab 02/04/24 0705 02/05/24 1525 02/06/24 1058  WBC 9.6 13.9*  --   NEUTROABS 6.5  --   --   HGB 16.3 15.3 13.3  HCT 50.7 47.5 39.0  MCV 83.4 82.6  --   PLT 227  215  --    Basic Metabolic Panel: Recent Labs  Lab 02/04/24 0705 02/05/24 1525 02/06/24 1058  NA 138 138 139  K 4.4 4.2 4.8  CL 106 105  --   CO2 24 22  --   GLUCOSE 103* 104*  --   BUN 15 29*  --   CREATININE 1.02 1.25*  --   CALCIUM 9.5 9.1  --    GFR: Estimated Creatinine Clearance: 59.3 mL/min (A) (by C-G formula based on SCr of 1.25 mg/dL (H)). Liver Function Tests: Recent Labs  Lab  02/04/24 0705 02/05/24 1525  AST 17 16  ALT 16 14  ALKPHOS 55 43  BILITOT 0.6 0.9  PROT 8.3* 6.9  ALBUMIN  4.1 3.4*   No results for input(s): "LIPASE", "AMYLASE" in the last 168 hours. No results for input(s): "AMMONIA" in the last 168 hours. Coagulation Profile: Recent Labs  Lab 02/05/24 1525  INR 1.2   Cardiac Enzymes: No results for input(s): "CKTOTAL", "CKMB", "CKMBINDEX", "TROPONINI" in the last 168 hours. BNP (last 3 results) No results for input(s): "PROBNP" in the last 8760 hours. HbA1C: No results for input(s): "HGBA1C" in the last 72 hours. CBG: No results for input(s): "GLUCAP" in the last 168 hours. Lipid Profile: No results for input(s): "CHOL", "HDL", "LDLCALC", "TRIG", "CHOLHDL", "LDLDIRECT" in the last 72 hours. Thyroid  Function Tests: No results for input(s): "TSH", "T4TOTAL", "FREET4", "T3FREE", "THYROIDAB" in the last 72 hours. Anemia Panel: No results for input(s): "VITAMINB12", "FOLATE", "FERRITIN", "TIBC", "IRON", "RETICCTPCT" in the last 72 hours. Urine analysis:    Component Value Date/Time   COLORURINE AMBER (A) 08/28/2016 1645   APPEARANCEUR CLOUDY (A) 08/28/2016 1645   LABSPEC 1.020 08/28/2016 1645   PHURINE 5.0 08/28/2016 1645   GLUCOSEU NEGATIVE 08/28/2016 1645   HGBUR NEGATIVE 08/28/2016 1645   BILIRUBINUR SMALL (A) 08/28/2016 1645   KETONESUR NEGATIVE 08/28/2016 1645   PROTEINUR NEGATIVE 08/28/2016 1645   UROBILINOGEN 0.2 04/17/2014 1620   NITRITE NEGATIVE 08/28/2016 1645   LEUKOCYTESUR NEGATIVE 08/28/2016 1645   Sepsis Labs: @LABRCNTIP (procalcitonin:4,lacticidven:4)  ) Recent Results (from the past 240 hours)  Resp panel by RT-PCR (RSV, Flu A&B, Covid) Anterior Nasal Swab     Status: None   Collection Time: 02/04/24  7:30 AM   Specimen: Anterior Nasal Swab  Result Value Ref Range Status   SARS Coronavirus 2 by RT PCR NEGATIVE NEGATIVE Final    Comment: (NOTE) SARS-CoV-2 target nucleic acids are NOT DETECTED.  The SARS-CoV-2  RNA is generally detectable in upper respiratory specimens during the acute phase of infection. The lowest concentration of SARS-CoV-2 viral copies this assay can detect is 138 copies/mL. A negative result does not preclude SARS-Cov-2 infection and should not be used as the sole basis for treatment or other patient management decisions. A negative result may occur with  improper specimen collection/handling, submission of specimen other than nasopharyngeal swab, presence of viral mutation(s) within the areas targeted by this assay, and inadequate number of viral copies(<138 copies/mL). A negative result must be combined with clinical observations, patient history, and epidemiological information. The expected result is Negative.  Fact Sheet for Patients:  BloggerCourse.com  Fact Sheet for Healthcare Providers:  SeriousBroker.it  This test is no t yet approved or cleared by the [place]  FDA and  has been authorized for detection and/or diagnosis of SARS-CoV-2 by FDA under an Emergency Use Authorization (EUA). This EUA will remain  in effect (meaning this test can be used) for the duration of the COVID-19  declaration under Section 564(b)(1) of the Act, 21 U.S.C.section 360bbb-3(b)(1), unless the authorization is terminated  or revoked sooner.       Influenza A by PCR NEGATIVE NEGATIVE Final   Influenza B by PCR NEGATIVE NEGATIVE Final    Comment: (NOTE) The Xpert Xpress SARS-CoV-2/FLU/RSV plus assay is intended as an aid in the diagnosis of influenza from Nasopharyngeal swab specimens and should not be used as a sole basis for treatment. Nasal washings and aspirates are unacceptable for Xpert Xpress SARS-CoV-2/FLU/RSV testing.  Fact Sheet for Patients: BloggerCourse.com  Fact Sheet for Healthcare Providers: SeriousBroker.it  This test is not yet approved or cleared by the  [place]  FDA and has been authorized for detection and/or diagnosis of SARS-CoV-2 by FDA under an Emergency Use Authorization (EUA). This EUA will remain in effect (meaning this test can be used) for the duration of the COVID-19 declaration under Section 564(b)(1) of the Act, 21 U.S.C. section 360bbb-3(b)(1), unless the authorization is terminated or revoked.     Resp Syncytial Virus by PCR NEGATIVE NEGATIVE Final    Comment: (NOTE) Fact Sheet for Patients: BloggerCourse.com  Fact Sheet for Healthcare Providers: SeriousBroker.it  This test is not yet approved or cleared by the [place]  FDA and has been authorized for detection and/or diagnosis of SARS-CoV-2 by FDA under an Emergency Use Authorization (EUA). This EUA will remain in effect (meaning this test can be used) for the duration of the COVID-19 declaration under Section 564(b)(1) of the Act, 21 U.S.C. section 360bbb-3(b)(1), unless the authorization is terminated or revoked.  Performed at [facility], [address], [place], [place] [address]   MRSA Next Gen by PCR, Nasal     Status: Abnormal   Collection Time: 02/04/24  2:02 PM   Specimen: Nasal Mucosa; Nasal Swab  Result Value Ref Range Status   MRSA by PCR Next Gen DETECTED (A) NOT DETECTED Final    Comment: CRITICAL RESULT CALLED TO, READ BACK BY AND VERIFIED WITH: RN MONICA SIXTOS [id number] BY J RAZZAK, MT (NOTE) The GeneXpert MRSA Assay (FDA approved for NASAL specimens only), is one component of a comprehensive MRSA colonization surveillance program. It is not intended to diagnose MRSA infection nor to guide or monitor treatment for MRSA infections. Test performance is not FDA approved in patients less than 55 years old. Performed at [facility], [address] [place], [place] [address]          Radiology Studies: DG Chest Port 1 View Result Date: 02/06/2024 CLINICAL DATA:   Pneumothorax EXAM: PORTABLE CHEST 1 VIEW COMPARISON:  02/05/2024 FINDINGS: Single frontal view of the chest demonstrates stable position of the left-sided pigtail pleural drainage catheter. There is persistent loculated left basilar pneumothorax, slightly increased since prior study. No midline shift or tension effect. Cardiac silhouette remains enlarged. Continued pulmonary vascular congestion. Background emphysema again noted. Increased subcutaneous gas within the left lateral chest wall. IMPRESSION: 1. Left-sided pigtail pleural drainage catheter, with slight increase in the loculated left basilar pneumothorax seen previously. No tension effect or midline shift. 2. Increased subcutaneous gas within the left lateral chest wall, which may signify air leak or recent drainage catheter manipulation. Electronically Signed   By: Bobbye Burrow M.D.   On: 02/06/2024 10:22   DG Abd 1 View Result Date: 02/05/2024 CLINICAL DATA:  Distension EXAM: ABDOMEN - 1 VIEW COMPARISON:  04/26/2023, chest x-ray 02/05/2024 FINDINGS: Left lower chest tube with small pneumothorax at the left base and CP angle. Nonobstructed gas  pattern. No radiopaque calculi. IMPRESSION: 1. Nonobstructed gas pattern. 2. Left lower chest tube with small pneumothorax at the left base and CP angle as seen on prior chest x-ray. Electronically Signed   By: Esmeralda Hedge M.D.   On: 02/05/2024 18:38   DG CHEST PORT 1 VIEW Result Date: 02/05/2024 CLINICAL DATA:  78 year old male with bullous emphysema and spontaneous pneumothorax. EXAM: PORTABLE CHEST 1 VIEW COMPARISON:  Chest CT yesterday and earlier. FINDINGS: Portable AP view at 0304 hours. Stable left chest tube position since yesterday morning. Lower lung volumes. Ongoing left pneumothorax, not significantly changed from yesterday and up to moderate by CT (please see that report) Stable cardiac size and mediastinal contours. Stable ventilation otherwise. Negative visible bowel gas.  Stable visualized  osseous structures. IMPRESSION: Stable left chest tube position since yesterday morning and unchanged left pneumothorax, which was Moderate by CT yesterday. Consider up sizing tube. Lower lung volumes today.  Bullous emphysema. Electronically Signed   By: Marlise Simpers M.D.   On: 02/05/2024 04:27   CT CHEST WO CONTRAST Result Date: 02/05/2024 CLINICAL DATA:  78 year old male with bullous emphysema and spontaneous pneumothorax. EXAM: CT CHEST WITHOUT CONTRAST TECHNIQUE: Multidetector CT imaging of the chest was performed following the standard protocol without IV contrast. RADIATION DOSE REDUCTION: This exam was performed according to the departmental dose-optimization program which includes automated exposure control, adjustment of the mA and/or kV according to patient size and/or use of iterative reconstruction technique. COMPARISON:  Chest CT 04/25/2023. Portable chest radiographs 0830 hours the same day and earlier. FINDINGS: Cardiovascular: Mild pneumothorax related mass effect on the right heart border. Overall cardiac size is stable from last year and within normal limits. No pericardial effusion. Vascular patency is not evaluated in the absence of IV contrast. Mild for age Calcified aortic atherosclerosis. Mediastinum/Nodes: Negative for mediastinal mass or lymphadenopathy in the absence of IV contrast. Lungs/Pleura: Bullous emphysema. CT last year demonstrated moderate to severe bullae along the mediastinal contours and in the lung apices more pronounced on the right at that time. Left-side pneumothorax with pigtail left chest tube in place entering the pleural space via the 4/5 left inter costal space. Moderate volume of residual pleural air especially at the left lung base (series 4, image 119). Confluent opacity in the lingula, segmental left lower lobe is probably lung collapse related atelectasis. The central airways remain patent. No superimposed pleural effusion. Right lung parenchyma appears stable from  last year. Upper Abdomen: Negative visible noncontrast liver, gallbladder, spleen pancreas, right adrenal gland, right kidney, and bowel in the upper abdomen. Musculoskeletal: Small volume of chest tube related left lateral chest wall soft tissue gas. Spine degeneration, thoracic scoliosis. Stable visualized osseous structures. IMPRESSION: 1. Bullous Emphysema (ICD10-J43.9) with Moderate-sized residual Left Pneumothorax despite pigtail chest tube in place. Associated mild mass effect on the right heart border. Left lingula and lower lobe atelectasis. No pleural effusion. 2.  Aortic Atherosclerosis (ICD10-I70.0). Electronically Signed   By: Marlise Simpers M.D.   On: 02/05/2024 04:26        Scheduled Meds:  aspirin  EC  81 mg Oral Q breakfast   azithromycin   500 mg Oral Daily   bisacodyl   10 mg Oral Daily   bisoprolol   2.5 mg Oral QPC supper   budeson-glycopyrrolate -formoterol   2 puff Inhalation BID   Chlorhexidine  Gluconate Cloth  6 each Topical Daily   dextromethorphan -guaiFENesin   1 tablet Oral BID   gabapentin   300 mg Oral BID   heparin   5,000 Units Subcutaneous Q8H  ipratropium-albuterol   3 mL Nebulization Q6H   irbesartan   75 mg Oral Daily   lubiprostone   24 mcg Oral Q breakfast   mesalamine   4.8 g Oral Q breakfast   mupirocin  ointment  1 Application Nasal BID   [START ON 02/07/2024] pantoprazole   40 mg Oral Daily   senna-docusate  1 tablet Oral QHS   sodium chloride  flush  3 mL Intravenous Q12H   tamsulosin   0.4 mg Oral Daily   Continuous Infusions:  sodium chloride  40 mL/hr at 02/06/24 1458    ceFAZolin  (ANCEF ) IV       LOS: 2 days    Time spent: 35 Minutes.    Fonnie Iba, MD  [facility] Pager #: [phone] 7PM-7AM contact night coverage as above

## 2024-02-06 NOTE — Interval H&P Note (Signed)
 History and Physical Interval Note:  02/06/2024 7:13 AM  Michael Ellison  has presented today for surgery, with the diagnosis of Left spontaneous pneumothorax.  The various methods of treatment have been discussed with the patient and family. After consideration of risks, benefits and other options for treatment, the patient has consented to  Procedure(s) with comments: VIDEO ASSISTED THORACOSCOPY (VATS)/WEDGE RESECTION (Left) - LEFT VIDEO ASSISTED THORACOSCOPY(VATS), BLEB RESECTION, PLEURAL ABRASION as a surgical intervention.  The patient's history has been reviewed, patient examined, no change in status, stable for surgery.  I have reviewed the patient's chart and labs.  Questions were answered to the patient's satisfaction.     [name]

## 2024-02-06 NOTE — Anesthesia Postprocedure Evaluation (Signed)
 Anesthesia Post Note  Patient: Michael Ellison  Procedure(s) Performed: WEDGE RESECTION, LUNG, ROBOT-ASSISTED, THORACOSCOPIC, BLEB RESECTION (Left: Chest)     Patient location during evaluation: PACU Anesthesia Type: General Level of consciousness: awake and alert Pain management: pain level controlled Vital Signs Assessment: post-procedure vital signs reviewed and stable Respiratory status: spontaneous breathing, nonlabored ventilation, respiratory function stable and patient connected to nasal cannula oxygen  Cardiovascular status: blood pressure returned to baseline and stable Postop Assessment: no apparent nausea or vomiting Anesthetic complications: no   No notable events documented.  Last Vitals:  Vitals:   02/06/24 1200 02/06/24 1300  BP: 126/70   Pulse: 73   Resp: (!) 30   Temp:  36.7 C  SpO2: 97%     Last Pain:  Vitals:   02/06/24 1315  TempSrc:   PainSc: 6                  Leslye Rast

## 2024-02-06 NOTE — Brief Op Note (Signed)
 02/04/2024 - 02/06/2024  11:24 AM  PATIENT:  Michael Ellison  78 y.o. male  PRE-OPERATIVE DIAGNOSIS:  Left spontaneous pneumothorax  POST-OPERATIVE DIAGNOSIS:  Left spontaneous pneumothorax  PROCEDURE:  Procedure(s) with comments:  Right Video Assisted Thoracoscopy  Robotic Assisted Right Video Assisted Thoracoscopy - Wedge resection bleb RUL x 4  SURGEON:  Surgeons and Role:    [name], MD - Primary  PHYSICIAN ASSISTANT: Gates Kasal PA-C  ASSISTANTS: Marquita Situ PA- Student    ANESTHESIA:   general  EBL:  500 mL   BLOOD ADMINISTERED:none  DRAINS:  28 Blake Drain Right Chest    LOCAL MEDICATIONS USED:  NONE  SPECIMEN:  Source of Specimen:  Right Lung Blebs x 4  DISPOSITION OF SPECIMEN:  PATHOLOGY  COUNTS:  YES  TOURNIQUET:  * No tourniquets in log *  DICTATION: .Dragon Dictation  PLAN OF CARE: Admit to inpatient   PATIENT DISPOSITION:  PACU - hemodynamically stable.   Delay start of Pharmacological VTE agent (>24hrs) due to surgical blood loss or risk of bleeding: no

## 2024-02-06 NOTE — Anesthesia Procedure Notes (Addendum)
 Procedure Name: Intubation Date/Time: 02/06/2024 7:45 AM  Performed by: Rochelle Chu, CRNAPre-anesthesia Checklist: Patient identified, Emergency Drugs available, Suction available and Patient being monitored Patient Re-evaluated:Patient Re-evaluated prior to induction Oxygen  Delivery Method: Circle system utilized Preoxygenation: Pre-oxygenation with 100% oxygen  Induction Type: IV induction Ventilation: Mask ventilation without difficulty and Oral airway inserted - appropriate to patient size Laryngoscope Size: Mac and 4 Grade View: Grade I Tube type: Oral Endobronchial tube: Left, EBT position confirmed by auscultation and Double lumen EBT and 37 Fr Number of attempts: 1 Airway Equipment and Method: Stylet Placement Confirmation: ETT inserted through vocal cords under direct vision, positive ETCO2, breath sounds checked- equal and bilateral and CO2 detector Secured at: 29 cm Tube secured with: Tape Dental Injury: Teeth and Oropharynx as per pre-operative assessment

## 2024-02-06 NOTE — Anesthesia Procedure Notes (Signed)
 Arterial Line Insertion Start/End4/21/2025 7:15 AM Performed by: Rochelle Chu, CRNA, CRNA  Patient location: Pre-op. Preanesthetic checklist: patient identified, IV checked, site marked, risks and benefits discussed, surgical consent, monitors and equipment checked, pre-op evaluation, timeout performed and anesthesia consent Lidocaine  1% used for infiltration Left, radial was placed Catheter size: 20 G Hand hygiene performed  and maximum sterile barriers used   Attempts: 1 Procedure performed without using ultrasound guided technique. Following insertion, dressing applied and Biopatch. Post procedure assessment: normal and unchanged  Patient tolerated the procedure well with no immediate complications.

## 2024-02-06 NOTE — Plan of Care (Signed)
  Problem: Education: Goal: Knowledge of General Education information will improve Description: Including pain rating scale, medication(s)/side effects and non-pharmacologic comfort measures Outcome: Progressing   Problem: Health Behavior/Discharge Planning: Goal: Ability to manage health-related needs will improve Outcome: Progressing   Problem: Clinical Measurements: Goal: Ability to maintain clinical measurements within normal limits will improve Outcome: Progressing Goal: Will remain free from infection Outcome: Progressing Goal: Cardiovascular complication will be avoided Outcome: Progressing   Problem: Activity: Goal: Risk for activity intolerance will decrease Outcome: Progressing   Problem: Nutrition: Goal: Adequate nutrition will be maintained Outcome: Progressing   Problem: Coping: Goal: Level of anxiety will decrease Outcome: Progressing   Problem: Elimination: Goal: Will not experience complications related to bowel motility Outcome: Progressing Goal: Will not experience complications related to urinary retention Outcome: Progressing   Problem: Pain Managment: Goal: General experience of comfort will improve and/or be controlled Outcome: Progressing

## 2024-02-06 NOTE — Anesthesia Preprocedure Evaluation (Addendum)
 Anesthesia Evaluation  Patient identified by MRN, date of birth, ID band Patient awake    Reviewed: Allergy & Precautions, NPO status , Patient's Chart, lab work & pertinent test results, reviewed documented beta blocker date and time   History of Anesthesia Complications Negative for: history of anesthetic complications  Airway Mallampati: II  TM Distance: >3 FB     Dental  (+) Missing,    Pulmonary shortness of breath, pneumonia, COPD, former smoker Recurrent PTX s/p chest tube   breath sounds clear to auscultation + decreased breath sounds      Cardiovascular hypertension, (-) angina (-) CAD, (-) Past MI and (-) Cardiac Stents + Valvular Problems/Murmurs (mild MR) MR  Rhythm:Regular Rate:Normal  Normal LV, RV function   Neuro/Psych neg Seizures PSYCHIATRIC DISORDERS  Depression       GI/Hepatic ,GERD  ,,  Endo/Other    Renal/GU CRFRenal disease     Musculoskeletal  (+) Arthritis ,    Abdominal   Peds  Hematology  (+) Blood dyscrasia, anemia   Anesthesia Other Findings   Reproductive/Obstetrics                             Anesthesia Physical Anesthesia Plan  ASA: 3  Anesthesia Plan: General   Post-op Pain Management:    Induction: Intravenous  PONV Risk Score and Plan: 2 and Ondansetron  and Dexamethasone   Airway Management Planned: Double Lumen EBT  Additional Equipment: Arterial line  Intra-op Plan:   Post-operative Plan: Extubation in OR  Informed Consent: I have reviewed the patients History and Physical, chart, labs and discussed the procedure including the risks, benefits and alternatives for the proposed anesthesia with the patient or authorized representative who has indicated his/her understanding and acceptance.     Dental advisory given  Plan Discussed with:   Anesthesia Plan Comments:         Anesthesia Quick Evaluation

## 2024-02-07 ENCOUNTER — Encounter (HOSPITAL_COMMUNITY): Payer: Self-pay | Admitting: Thoracic Surgery (Cardiothoracic Vascular Surgery)

## 2024-02-07 ENCOUNTER — Inpatient Hospital Stay (HOSPITAL_COMMUNITY)

## 2024-02-07 DIAGNOSIS — J9383 Other pneumothorax: Secondary | ICD-10-CM | POA: Diagnosis not present

## 2024-02-07 LAB — CBC
HCT: 39.5 % (ref 39.0–52.0)
Hemoglobin: 12.6 g/dL — ABNORMAL LOW (ref 13.0–17.0)
MCH: 26.4 pg (ref 26.0–34.0)
MCHC: 31.9 g/dL (ref 30.0–36.0)
MCV: 82.8 fL (ref 80.0–100.0)
Platelets: 183 10*3/uL (ref 150–400)
RBC: 4.77 MIL/uL (ref 4.22–5.81)
RDW: 15.8 % — ABNORMAL HIGH (ref 11.5–15.5)
WBC: 12.5 10*3/uL — ABNORMAL HIGH (ref 4.0–10.5)
nRBC: 0 % (ref 0.0–0.2)

## 2024-02-07 LAB — BASIC METABOLIC PANEL WITH GFR
Anion gap: 5 (ref 5–15)
BUN: 29 mg/dL — ABNORMAL HIGH (ref 8–23)
CO2: 25 mmol/L (ref 22–32)
Calcium: 8.1 mg/dL — ABNORMAL LOW (ref 8.9–10.3)
Chloride: 103 mmol/L (ref 98–111)
Creatinine, Ser: 1.32 mg/dL — ABNORMAL HIGH (ref 0.61–1.24)
GFR, Estimated: 56 mL/min — ABNORMAL LOW (ref >60)
Glucose, Bld: 121 mg/dL — ABNORMAL HIGH (ref 70–99)
Potassium: 5.1 mmol/L (ref 3.5–5.1)
Sodium: 133 mmol/L — ABNORMAL LOW (ref 135–145)

## 2024-02-07 LAB — SURGICAL PATHOLOGY

## 2024-02-07 MED ORDER — IPRATROPIUM-ALBUTEROL 0.5-2.5 (3) MG/3ML IN SOLN
3.0000 mL | Freq: Two times a day (BID) | RESPIRATORY_TRACT | Status: DC
  Administered 2024-02-08 – 2024-02-10 (×4): 3 mL via RESPIRATORY_TRACT
  Filled 2024-02-07 (×4): qty 3

## 2024-02-07 MED ORDER — SENNOSIDES-DOCUSATE SODIUM 8.6-50 MG PO TABS
1.0000 | ORAL_TABLET | Freq: Two times a day (BID) | ORAL | Status: DC
  Administered 2024-02-07 – 2024-02-10 (×7): 1 via ORAL
  Filled 2024-02-07 (×7): qty 1

## 2024-02-07 NOTE — Plan of Care (Signed)
  Problem: Education: Goal: Knowledge of General Education information will improve Description: Including pain rating scale, medication(s)/side effects and non-pharmacologic comfort measures Outcome: Progressing   Problem: Health Behavior/Discharge Planning: Goal: Ability to manage health-related needs will improve Outcome: Progressing   Problem: Clinical Measurements: Goal: Cardiovascular complication will be avoided Outcome: Progressing   Problem: Nutrition: Goal: Adequate nutrition will be maintained Outcome: Progressing   Problem: Coping: Goal: Level of anxiety will decrease Outcome: Progressing   Problem: Education: Goal: Knowledge of disease or condition will improve Outcome: Progressing Goal: Knowledge of the prescribed therapeutic regimen will improve Outcome: Progressing   Problem: Clinical Measurements: Goal: Postoperative complications will be avoided or minimized Outcome: Progressing   Problem: Skin Integrity: Goal: Wound healing without signs and symptoms infection will improve Outcome: Progressing

## 2024-02-07 NOTE — Care Management Important Message (Signed)
 Important Message  Patient Details  Name: Michael Ellison MRN: [id number] Date of Birth: 09-Jul-1946   Important Message Given:  Yes - Medicare IM     Wynonia Hedges 02/07/2024, 2:43 PM

## 2024-02-07 NOTE — Evaluation (Signed)
 Physical Therapy Evaluation Patient Details Name: Michael Ellison MRN: [id number] DOB: 1946-07-24 Today's Date: 02/07/2024  History of Present Illness  78 y.o. male presents to the ED 02/04/24 with complaints of dyspnea and left-sided chest discomfort since 4/18. chest x-ray with 50% left-sided pneumothorax; L chest tube placed; 4/21 VATS with wedge resection RUL  PMH significant for COPD with chronic hypoxic respiratory failure using oxygen  at 2 to 3 L as needed, HTN, GERD , PSVT  Clinical Impression   Pt admitted secondary to problem above with deficits below. PTA patient was living alone and walking without a device. He cares for his own home. Pt currently requires CGA for OOB to chair; limited by decrease O2 saturation to 80% with activity.  Anticipate patient will benefit from PT to address problems listed below.Will continue to follow acutely to maximize functional mobility independence and safety. Anticipate pt will progress to returning home alone with HHPT and no DME needs. Will continue to update discharge plan as needed.          If plan is discharge home, recommend the following: A little help with walking and/or transfers;Assistance with cooking/housework;Assist for transportation   Can travel by private vehicle        Equipment Recommendations None recommended by PT (will continue to assess if needed)  Recommendations for Other Services       Functional Status Assessment Patient has had a recent decline in their functional status and demonstrates the ability to make significant improvements in function in a reasonable and predictable amount of time.     Precautions / Restrictions Precautions Precautions: Fall Recall of Precautions/Restrictions: Intact Precaution/Restrictions Comments: L Chest Tube Restrictions Weight Bearing Restrictions Per Provider Order: No      Mobility  Bed Mobility Overal bed mobility: Needs Assistance Bed Mobility: Sidelying to Sit    Sidelying to sit: HOB elevated, Contact guard assist       General bed mobility comments: for line management, no physical assist needed    Transfers Overall transfer level: Needs assistance Equipment used: Rolling walker (2 wheels) Transfers: Sit to/from Stand, Bed to chair/wheelchair/BSC Sit to Stand: Contact guard assist, +2 safety/equipment   Step pivot transfers: Contact guard assist, +2 safety/equipment       General transfer comment: initial cues for safe hand placement with RW - then good follow through. no physical assist throughout transfers. +2 for line management only    Ambulation/Gait               General Gait Details: deferred due to desaturation to 80% with standing transfer with dyspnea  Stairs            Wheelchair Mobility     Tilt Bed    Modified Rankin (Stroke Patients Only)       Balance Overall balance assessment: No apparent balance deficits (not formally assessed)                                           Pertinent Vitals/Pain Pain Assessment Pain Assessment: 0-10 Pain Score: 5  Pain Location: incicision site Pain Descriptors / Indicators: Discomfort, Grimacing, Sore Pain Intervention(s): Limited activity within patient's tolerance, Monitored during session, Patient requesting pain meds-RN notified    Home Living Family/patient expects to be discharged to:: Private residence Living Arrangements: Alone Available Help at Discharge: Family;Available PRN/intermittently Type of Home: House Home Access: Level entry  Home Layout: One level Home Equipment: Rolling Walker (2 wheels);Grab bars - tub/shower;Hand held shower head;Cane - single point;Shower seat      Prior Function Prior Level of Function : Independent/Modified Independent;Driving             Mobility Comments: does not use DME, 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Surveyor, mining) ADLs Comments: independent, cooks, cleans, manages meds and fincances      Extremity/Trunk Assessment   Upper Extremity Assessment Upper Extremity Assessment: Defer to OT evaluation    Lower Extremity Assessment Lower Extremity Assessment: Overall WFL for tasks assessed    Cervical / Trunk Assessment Cervical / Trunk Assessment: Other exceptions Cervical / Trunk Exceptions: L chest tube  Communication   Communication Communication: No apparent difficulties    Cognition Arousal: Alert Behavior During Therapy: WFL for tasks assessed/performed                             Following commands: Intact       Cueing Cueing Techniques: Verbal cues, Gestural cues     General Comments General comments (skin integrity, edema, etc.): on 4L O2 throughout; down to 80% with transfer and ?accuracy of pleth; once seated reading improved with sequential improvement in sats up to 90%    Exercises     Assessment/Plan    PT Assessment Patient needs continued PT services  PT Problem List Decreased activity tolerance;Decreased mobility;Decreased knowledge of use of DME;Cardiopulmonary status limiting activity;Pain       PT Treatment Interventions DME instruction;Gait training;Functional mobility training;Therapeutic activities;Therapeutic exercise;Patient/family education    PT Goals (Current goals can be found in the Care Plan section)  Acute Rehab PT Goals Patient Stated Goal: return home alone PT Goal Formulation: With patient Time For Goal Achievement: 02/21/24 Potential to Achieve Goals: Good    Frequency Min 3X/week     Co-evaluation PT/OT/SLP Co-Evaluation/Treatment: Yes Reason for Co-Treatment: For patient/therapist safety;To address functional/ADL transfers PT goals addressed during session: Mobility/safety with mobility;Balance;Proper use of DME;Strengthening/ROM OT goals addressed during session: ADL's and self-care;Strengthening/ROM;Proper use of Adaptive equipment and DME       AM-PAC PT "6 Clicks" Mobility  Outcome  Measure Help needed turning from your back to your side while in a flat bed without using bedrails?: None Help needed moving from lying on your back to sitting on the side of a flat bed without using bedrails?: A Little Help needed moving to and from a bed to a chair (including a wheelchair)?: A Little Help needed standing up from a chair using your arms (e.g., wheelchair or bedside chair)?: A Little Help needed to walk in hospital room?: Total Help needed climbing 3-5 steps with a railing? : Total 6 Click Score: 15    End of Session Equipment Utilized During Treatment: Gait belt;Oxygen  Activity Tolerance: Treatment limited secondary to medical complications (Comment) (desaturates with activity) Patient left: in chair;with call bell/phone within reach Nurse Communication: Mobility status PT Visit Diagnosis: Other abnormalities of gait and mobility (R26.89)    Time: 1610-9604 PT Time Calculation (min) (ACUTE ONLY): 29 min   Charges:   PT Evaluation $PT Eval Low Complexity: 1 Low   PT General Charges $$ ACUTE PT VISIT: 1 Visit          Gayle Kava, PT Acute Rehabilitation Services  Office [phone]  Guilford Leep 02/07/2024, 12:20 PM

## 2024-02-07 NOTE — Progress Notes (Signed)
 PROGRESS NOTE    GOTHAM RADEN  UVO:[id number] DOB: 12-05-1945 DOA: 02/04/2024 PCP: Wyvonna Heidelberg, MD    Brief Narrative:   Michael Ellison is a 78 y.o. male with past medical history significant for COPD/emphysema with bullae, chronic hypoxic respite failure on 2-3 L nasal cannula at baseline, HTN, PSVT, GERD, reformed tobacco abuse disorder who presented as a transfer from [facility] ED with acute left-sided tension pneumothorax.  Chest tube was placed at [facility] and transferred to [facility] for further evaluation and management by cardiothoracic surgery.  Patient underwent robotic assisted right video-assisted thoracoscopy with resection of multiple blebs and lysis of adhesions by [name] on 02/06/2024.  Assessment & Plan:   Acute on chronic respiratory failure Left-sided tension pneumothorax, acute History of COPD/emphysema with bullae Patient initially presenting to the ED with acute shortness of breath.  Imaging notable for acute tension pneumothorax and chest tube was placed.  Patient was transferred to [facility] for evaluation under the cardiothoracic surgery service.  Patient underwent robotic assisted right video-assisted thorascopic with resection of multiple blebs and lysis of adhesions by [name] on 02/06/2024. Pathology from bleb resection; emphysematous bleb, negative for malignancy -- Continue supplemental oxygen , maintain SpO2 greater than 88%, baseline 2-3L PRN -- Continues with chest tube to suction, possible removal tomorrow -- Monitor chest tube output, 580 mL past 24 hours -- CXR in the am  COPD/emphysema -- Bretztri 2 puffs twice daily (on Symbicort  outpatient) -- DuoNeb 3 times daily -- Albuterol  neb every 2 hours.  Wheezing/shortness of breath  Essential hypertension History of PSVT -- Irbesartan  75 mg p.o. daily -- Bisoprolol  2 point milligrams p.o. daily  BPH -- Tamsulosin  0.4 g p.o.  daily  Constipation -- Senokot-S1 tablet p.o. twice daily -- Lubiprostone  24 mcg PO daily -- MiraLAX  daily as needed mild constipation -- Dulcolax suppository daily/PRN moderate constipation   DVT prophylaxis: SCD's Start: 02/06/24 1357 heparin  injection 5,000 Units Start: 02/04/24 1400 SCDs Start: 02/04/24 1007 Place TED hose Start: 02/04/24 1007    Code Status: Full Code Family Communication: No family present at bedside this morning  Disposition Plan:  Level of care: Progressive Status is: Inpatient Remains inpatient appropriate because: Remains with chest tube in place    Consultants:  PCCM CTS  Procedures:  VATS w/ bleb resection, lysis of adhesions 4/21, CTS; [name]  Antimicrobials:  Azithromycin  4/19>> Cefazolin  4/21 - 4/21   Subjective: Patient seen examined bedside, lying in bedside chair.  Reports dyspneic earlier this morning, now resolved.  Also reports some irritation surrounding her chest tube insertion site.  Seen by cardiothoracic surgery, remains on chest tube to suction with potential removal planned for tomorrow.  Patient reports no bowel movement since Saturday, discussed has multiple agents available as needed.  No other specific questions, concerns or complaints at this time.  Denies headache, no dizziness, no chest pain, no palpitations, no current shortness of breath, no fever/chills/night sweats, no nausea/vomiting/diarrhea, no focal weakness, no fatigue, no paresthesias.  No acute events overnight per nursing.  Objective: Vitals:   02/07/24 0534 02/07/24 0737 02/07/24 0824 02/07/24 1059  BP:  132/72  (!) 133/90  Pulse:  81 83 93  Resp:  (!) 25 (!) 27 16  Temp:  98.5 F (36.9 C)  98.6 F (37 C)  TempSrc:  Oral  Oral  SpO2: 95% 95%  94%  Weight:      Height:  Intake/Output Summary (Last 24 hours) at 02/07/2024 1334 Last data filed at 02/07/2024 1000 Gross per 24 hour  Intake --  Output 700 ml  Net -700 ml   Filed Weights    02/04/24 0659  Weight: 95.3 kg    Examination:  Physical Exam: GEN: NAD, alert and oriented x 3, elderly in appearance HEENT: NCAT, PERRL, EOMI, sclera clear, MMM PULM: Breath sound slight diminished breath bases, no wheezes/crackles, normal respiratory effort without accessory muscle use, 4 L nasal cannula with SPO2 95% at rest, chest tube noted to suction CV: RRR w/o M/G/R GI: abd soft, NTND, NABS, no R/G/M MSK: no peripheral edema, muscle strength globally intact 5/5 bilateral upper/lower extremities NEURO: CN II-XII intact, no focal deficits, sensation to light touch intact PSYCH: normal mood/affect Integumentary: dry/intact, no rashes or wounds    Data Reviewed: I have personally reviewed following labs and imaging studies  CBC: Recent Labs  Lab 02/04/24 0705 02/05/24 1525 02/06/24 1058 02/07/24 0301  WBC 9.6 13.9*  --  12.5*  NEUTROABS 6.5  --   --   --   HGB 16.3 15.3 13.3 12.6*  HCT 50.7 47.5 39.0 39.5  MCV 83.4 82.6  --  82.8  PLT 227 215  --  183   Basic Metabolic Panel: Recent Labs  Lab 02/04/24 0705 02/05/24 1525 02/06/24 1058 02/07/24 0301  NA 138 138 139 133*  K 4.4 4.2 4.8 5.1  CL 106 105  --  103  CO2 24 22  --  25  GLUCOSE 103* 104*  --  121*  BUN 15 29*  --  29*  CREATININE 1.02 1.25*  --  1.32*  CALCIUM 9.5 9.1  --  8.1*   GFR: Estimated Creatinine Clearance: 56.2 mL/min (A) (by C-G formula based on SCr of 1.32 mg/dL (H)). Liver Function Tests: Recent Labs  Lab 02/04/24 0705 02/05/24 1525  AST 17 16  ALT 16 14  ALKPHOS 55 43  BILITOT 0.6 0.9  PROT 8.3* 6.9  ALBUMIN  4.1 3.4*   No results for input(s): "LIPASE", "AMYLASE" in the last 168 hours. No results for input(s): "AMMONIA" in the last 168 hours. Coagulation Profile: Recent Labs  Lab 02/05/24 1525  INR 1.2   Cardiac Enzymes: No results for input(s): "CKTOTAL", "CKMB", "CKMBINDEX", "TROPONINI" in the last 168 hours. BNP (last 3 results) No results for input(s):  "PROBNP" in the last 8760 hours. HbA1C: No results for input(s): "HGBA1C" in the last 72 hours. CBG: No results for input(s): "GLUCAP" in the last 168 hours. Lipid Profile: No results for input(s): "CHOL", "HDL", "LDLCALC", "TRIG", "CHOLHDL", "LDLDIRECT" in the last 72 hours. Thyroid  Function Tests: No results for input(s): "TSH", "T4TOTAL", "FREET4", "T3FREE", "THYROIDAB" in the last 72 hours. Anemia Panel: No results for input(s): "VITAMINB12", "FOLATE", "FERRITIN", "TIBC", "IRON", "RETICCTPCT" in the last 72 hours. Sepsis Labs: No results for input(s): "PROCALCITON", "LATICACIDVEN" in the last 168 hours.  Recent Results (from the past 240 hours)  Resp panel by RT-PCR (RSV, Flu A&B, Covid) Anterior Nasal Swab     Status: None   Collection Time: 02/04/24  7:30 AM   Specimen: Anterior Nasal Swab  Result Value Ref Range Status   SARS Coronavirus 2 by RT PCR NEGATIVE NEGATIVE Final    Comment: (NOTE) SARS-CoV-2 target nucleic acids are NOT DETECTED.  The SARS-CoV-2 RNA is generally detectable in upper respiratory specimens during the acute phase of infection. The lowest concentration of SARS-CoV-2 viral copies this assay can detect is 138 copies/mL. A  negative result does not preclude SARS-Cov-2 infection and should not be used as the sole basis for treatment or other patient management decisions. A negative result may occur with  improper specimen collection/handling, submission of specimen other than nasopharyngeal swab, presence of viral mutation(s) within the areas targeted by this assay, and inadequate number of viral copies(<138 copies/mL). A negative result must be combined with clinical observations, patient history, and epidemiological information. The expected result is Negative.  Fact Sheet for Patients:  BloggerCourse.com  Fact Sheet for Healthcare Providers:  SeriousBroker.it  This test is no t yet approved or  cleared by the [place]  FDA and  has been authorized for detection and/or diagnosis of SARS-CoV-2 by FDA under an Emergency Use Authorization (EUA). This EUA will remain  in effect (meaning this test can be used) for the duration of the COVID-19 declaration under Section 564(b)(1) of the Act, 21 U.S.C.section 360bbb-3(b)(1), unless the authorization is terminated  or revoked sooner.       Influenza A by PCR NEGATIVE NEGATIVE Final   Influenza B by PCR NEGATIVE NEGATIVE Final    Comment: (NOTE) The Xpert Xpress SARS-CoV-2/FLU/RSV plus assay is intended as an aid in the diagnosis of influenza from Nasopharyngeal swab specimens and should not be used as a sole basis for treatment. Nasal washings and aspirates are unacceptable for Xpert Xpress SARS-CoV-2/FLU/RSV testing.  Fact Sheet for Patients: BloggerCourse.com  Fact Sheet for Healthcare Providers: SeriousBroker.it  This test is not yet approved or cleared by the [place]  FDA and has been authorized for detection and/or diagnosis of SARS-CoV-2 by FDA under an Emergency Use Authorization (EUA). This EUA will remain in effect (meaning this test can be used) for the duration of the COVID-19 declaration under Section 564(b)(1) of the Act, 21 U.S.C. section 360bbb-3(b)(1), unless the authorization is terminated or revoked.     Resp Syncytial Virus by PCR NEGATIVE NEGATIVE Final    Comment: (NOTE) Fact Sheet for Patients: BloggerCourse.com  Fact Sheet for Healthcare Providers: SeriousBroker.it  This test is not yet approved or cleared by the [place]  FDA and has been authorized for detection and/or diagnosis of SARS-CoV-2 by FDA under an Emergency Use Authorization (EUA). This EUA will remain in effect (meaning this test can be used) for the duration of the COVID-19 declaration under Section 564(b)(1) of the Act, 21  U.S.C. section 360bbb-3(b)(1), unless the authorization is terminated or revoked.  Performed at [facility], [address], [place], [place] [address]   MRSA Next Gen by PCR, Nasal     Status: Abnormal   Collection Time: 02/04/24  2:02 PM   Specimen: Nasal Mucosa; Nasal Swab  Result Value Ref Range Status   MRSA by PCR Next Gen DETECTED (A) NOT DETECTED Final    Comment: CRITICAL RESULT CALLED TO, READ BACK BY AND VERIFIED WITH: RN MONICA SIXTOS [id number] BY J RAZZAK, MT (NOTE) The GeneXpert MRSA Assay (FDA approved for NASAL specimens only), is one component of a comprehensive MRSA colonization surveillance program. It is not intended to diagnose MRSA infection nor to guide or monitor treatment for MRSA infections. Test performance is not FDA approved in patients less than 60 years old. Performed at [facility], [address] [place], [place] [address]          Radiology Studies: DG Chest [place] 1 View Result Date: 02/07/2024 CLINICAL DATA:  Status post left partial lobectomy EXAM: PORTABLE CHEST 1 VIEW COMPARISON:  Film from the previous day. FINDINGS: Postsurgical  changes are noted on the left with a chest tube in place. No sizable pneumothorax is noted. Subcutaneous emphysema is again noted and stable. Central vascular congestion is seen mild right basilar atelectasis is noted. IMPRESSION: Chest tube in place on the left.  No pneumothorax is noted. Mild vascular congestion is noted with right basilar atelectasis. Electronically Signed   By: Violeta Grey M.D.   On: 02/07/2024 10:32   DG Chest Port 1 View Result Date: 02/06/2024 CLINICAL DATA:  Status post partial lobectomy. EXAM: PORTABLE CHEST 1 VIEW COMPARISON:  CT of the chest 02/04/2024.  Chest x-ray 02/06/2004. FINDINGS: Left-sided chest tube has been replaced with distal tip near the apex. No pneumothorax visualized. Left chest wall emphysema is unchanged. There are increasing patchy airspace and interstitial  opacities in the left upper lobe and left mid lung. Right lung is grossly clear and unchanged. The cardiomediastinal silhouette is stable, the heart is enlarged. There is a questionable acute fracture of the left eighth rib intra not definitely seen on prior chest CT. IMPRESSION: 1. Left-sided chest tube has been replaced with distal tip near the apex. No pneumothorax visualized. 2. Increasing patchy airspace and interstitial opacities in the left upper lobe and left mid lung. 3. Questionable acute fracture of the left eighth rib. Electronically Signed   By: Tyron Gallon M.D.   On: 02/06/2024 16:40   DG Chest Port 1 View Result Date: 02/06/2024 CLINICAL DATA:  Pneumothorax EXAM: PORTABLE CHEST 1 VIEW COMPARISON:  02/05/2024 FINDINGS: Single frontal view of the chest demonstrates stable position of the left-sided pigtail pleural drainage catheter. There is persistent loculated left basilar pneumothorax, slightly increased since prior study. No midline shift or tension effect. Cardiac silhouette remains enlarged. Continued pulmonary vascular congestion. Background emphysema again noted. Increased subcutaneous gas within the left lateral chest wall. IMPRESSION: 1. Left-sided pigtail pleural drainage catheter, with slight increase in the loculated left basilar pneumothorax seen previously. No tension effect or midline shift. 2. Increased subcutaneous gas within the left lateral chest wall, which may signify air leak or recent drainage catheter manipulation. Electronically Signed   By: Bobbye Burrow M.D.   On: 02/06/2024 10:22   DG Abd 1 View Result Date: 02/05/2024 CLINICAL DATA:  Distension EXAM: ABDOMEN - 1 VIEW COMPARISON:  04/26/2023, chest x-ray 02/05/2024 FINDINGS: Left lower chest tube with small pneumothorax at the left base and CP angle. Nonobstructed gas pattern. No radiopaque calculi. IMPRESSION: 1. Nonobstructed gas pattern. 2. Left lower chest tube with small pneumothorax at the left base and CP  angle as seen on prior chest x-ray. Electronically Signed   By: Esmeralda Hedge M.D.   On: 02/05/2024 18:38        Scheduled Meds:  aspirin  EC  81 mg Oral Q breakfast   azithromycin   500 mg Oral Daily   bisacodyl   10 mg Oral Daily   bisoprolol   2.5 mg Oral QPC supper   budeson-glycopyrrolate -formoterol   2 puff Inhalation BID   Chlorhexidine  Gluconate Cloth  6 each Topical Daily   dextromethorphan -guaiFENesin   1 tablet Oral BID   gabapentin   300 mg Oral BID   heparin   5,000 Units Subcutaneous Q8H   ipratropium-albuterol   3 mL Nebulization TID   irbesartan   75 mg Oral Daily   lubiprostone   24 mcg Oral Q breakfast   mesalamine   4.8 g Oral Q breakfast   mupirocin  ointment  1 Application Nasal BID   pantoprazole   40 mg Oral Daily   senna-docusate  1 tablet Oral  BID   sodium chloride  flush  3 mL Intravenous Q12H   tamsulosin   0.4 mg Oral Daily   Continuous Infusions:   LOS: 3 days    Time spent: 51 minutes spent on 02/07/2024 caring for this patient face-to-face including chart review, ordering labs/tests, documenting, discussion with nursing staff, consultants, updating family and interview/physical exam    Rema Care [place], DO [facility] Available via Epic secure chat 7am-7pm After these hours, please refer to coverage provider listed on [email or web address] 02/07/2024, 1:34 PM

## 2024-02-07 NOTE — Op Note (Signed)
 NAME: [name], [name] MEDICAL RECORD NO: [id number] ACCOUNT NO: [id number] DATE OF BIRTH: [birth year] FACILITY: [facility] LOCATION: [facility] PHYSICIAN: Milon Aloe. Luna Salinas, MD  Operative Report   DATE OF PROCEDURE: 02/06/2024  PREOPERATIVE DIAGNOSIS:  Spontaneous left pneumothorax.  POSTOPERATIVE DIAGNOSIS:  Spontaneous left pneumothorax.  PROCEDURE:  Robotic-assisted left VATS, resection of multiple blebs, lysis of adhesions.  SURGEON:  Milon Aloe. Luna Salinas, MD.  ASSISTANT:  Gates Kasal, PA.  SECOND ASSISTANT:  Marquita Situ, PA student.  Experienced assistance was necessary for this case due to surgical complexity. Erin Barrett assisted with port placement, robot docking and undocking, instrument exchange, specimen retrieval, suctioning, and wound closure.  ANESTHESIA:  General.  FINDINGS:  Extensive adhesions at apex, large ruptured bleb anteriorly, extensive bleb disease of apex, smaller bleb medially on right upper lobe.  CLINICAL NOTE: Michael Ellison is a 78 year old gentleman with a history of COPD, on home oxygen .  He has a remote history of a left pneumothorax while hospitalized following a major abdominal procedure.  He presented with a spontaneous tension pneumothorax.  Given the severity of his bleb disease, he was advised to undergo surgical resection for definitive treatment.  The indications, risks, benefits, and alternatives were discussed in detail with the patient. He accepted the risks and agreed to proceed.  OPERATIVE NOTE: Michael Ellison was brought to the operating room on 02/06/2024.  He had induction of general anesthesia and was intubated with a double-lumen endotracheal tube.  Intravenous antibiotics were administered.  A Foley catheter was placed. Sequential compression devices were placed on the calves for DVT prophylaxis.  He was placed in a right lateral decubitus position.  Single lung ventilation of the right lung was initiated.  The left pleural tube then was  removed.  A Bair Hugger was placed for active warming.  The left chest was prepped and draped in the usual sterile fashion.  A timeout was performed.  An incision was made in approximately the seventh interspace in the anterior axillary line.  The chest was entered bluntly using a hemostat. A 5-mm port was inserted.  The thoracoscope was advanced into the chest.  A 5-cm working incision was made in the fourth interspace anterolaterally.  No rib spreading was performed during the procedure.  It was immediately noted there were severe adhesions at the apex.  There was a large bleb, which was adherent to the pericardial fat pad.  This bleb was mobilized and then removed with sequential firings of a Covidien stapler.  Reinforced staple lines were used.  The specimen was removed.  After removing that bleb, it was obvious that there was a much larger bleb that was ruptured superiorly and it was directly adjacent to and involved with an area of adhesions.  The decision was made to take down the adhesions to allow complete resection of the bleb, which would have been impossible otherwise.  Because of the angle, it was not going to be possible to do this thoracoscopically. Therefore, two additional port-type incisions were made.  Robotic ports were inserted.  The robot was deployed.  The camera arm was docked. Targeting was performed.  The remaining arms were docked.  The robotic instruments were inserted.  Adhesions then were taken down.  These were quite extensive and quite severe in some areas.  There was some bleeding from the lungs as well as the chest wall during this takedown. Once  the adhesions were taken down, the apex of the lung was mostly blebs and there were tears in several areas.  The blebs were removed with sequential firings of the robotic stapler using green and black cartridges.  There was good hemostasis after resection of the apical blebs.  The large anterior bleb now was completely mobilized and it  was removed using the robotic stapler, and finally a smaller bleb near the hilum of the right upper lobe was removed using the robotic stapler. All the specimens were sent to pathology as separate specimens. Because of the takedown of adhesions, there was good abrasion of the pleural surface and no further intervention was necessary in that area.  The sponges used during the dissection were removed.  The chest was then copiously irrigated with saline.  A test inflation to 30 cm of water  pressure revealed no obvious air leak.  A 28-French Blake drain was placed through the anterior port incision and secured with a #1 silk suture. Dual lung ventilation was resumed. The remaining incisions were closed in standard fashion. The chest tube was placed to a Pleur-Evac on suction.  The patient was placed back in the supine position.  He then was extubated in the operating room and taken to the post-anesthetic care unit in good condition.  All sponge, needle, and instrument counts were correct at the end of the procedure.   NIK D: 02/06/2024 3:32:59 pm T: 02/07/2024 2:34:00 am  JOB: [id number]/ [id number]

## 2024-02-07 NOTE — Progress Notes (Addendum)
 1 Day Post-Op Procedure(s) (LRB): WEDGE RESECTION, LUNG, ROBOT-ASSISTED, THORACOSCOPIC, BLEB RESECTION (Left) Subjective: Michael Ellison is alert and oriented  upright in bed. He reports having some discomfort at his incision site.   Objective: Vital signs in last 24 hours: Temp:  [97.6 F (36.4 C)-98.9 F (37.2 C)] 97.6 F (36.4 C) (04/22 0300) Pulse Rate:  [68-82] 82 (04/22 0300) Cardiac Rhythm: Normal sinus rhythm;Heart block (04/21 1900) Resp:  [16-30] 18 (04/22 0300) BP: (103-132)/(60-91) 116/80 (04/22 0300) SpO2:  [88 %-97 %] 95 % (04/22 0534) Arterial Line BP: (152-160)/(53-67) 160/67 (04/21 1200)  Hemodynamic parameters for last 24 hours:    Intake/Output from previous day: 04/21 0701 - 04/22 0700 In: 1550 [I.V.:1300; IV Piggyback:250] Out: 2550 [Urine:1300; Blood:900; Chest Tube:350] Intake/Output this shift: No intake/output data recorded.  General appearance: alert, cooperative, and no distress Neurologic: intact Heart: regular rate and rhythm and 2nd systolic murmur:   4/6, blowing at apex Lungs: diminished breath sounds bilaterally Abdomen: He has a distended abdomen with regular bowel sounds Extremities: extremities normal, atraumatic, no cyanosis or edema Wound: No erythema or infection at incision site   Lab Results: Recent Labs    02/05/24 1525 02/06/24 1058 02/07/24 0301  WBC 13.9*  --  12.5*  HGB 15.3 13.3 12.6*  HCT 47.5 39.0 39.5  PLT 215  --  183   BMET:  Recent Labs    02/05/24 1525 02/06/24 1058 02/07/24 0301  NA 138 139 133*  K 4.2 4.8 5.1  CL 105  --  103  CO2 22  --  25  GLUCOSE 104*  --  121*  BUN 29*  --  29*  CREATININE 1.25*  --  1.32*  CALCIUM 9.1  --  8.1*    PT/INR:  Recent Labs    02/05/24 1525  LABPROT 15.1  INR 1.2   ABG    Component Value Date/Time   PHART 7.298 (L) 02/06/2024 1058   HCO3 24.6 02/06/2024 1058   TCO2 26 02/06/2024 1058   ACIDBASEDEF 2.0 02/06/2024 1058   O2SAT 92 02/06/2024 1058   CBG (last  3)  No results for input(s): "GLUCAP" in the last 72 hours.  Assessment/Plan: S/P Procedure(s) (LRB): WEDGE RESECTION, LUNG, ROBOT-ASSISTED, THORACOSCOPIC, BLEB RESECTION (Left)   Cardio: NSR, Pulm: He is on 4L [place] tolerating it well with no signs of SOB. His chest tube output is with no signs of leak. Will keep his chest tube in for another day. CXR shows no signs of PTX, pending official read. GI: He has a distended abd, he says he has a history of hernia.  Will continue to monitor and defer to intensive  Renal: Cr trending up, other electrolytes stable. Continue to monitor Cr levels ID: WBC trending down no sign of fever  Wound: healing well no signs of infection. DVT: ON subcutaneous  heparin  Dispo: progressing well, still has chest tube in place. Will keep him      LOS: 3 days    [facility] 02/07/2024  Patient seen and examined, agree with above No air leak. Keep CT to suction today, hopefully can remove tomorrow Mobilize, advance diet  Michael Pinion C. Luna Salinas, MD [facility] [phone]

## 2024-02-07 NOTE — ED Provider Notes (Signed)
 [facility] PROGRESSIVE CARE Provider Note   CSN: [id number] Arrival date & time: 02/04/24  6213     History  Chief Complaint  Patient presents with   Shortness of Breath    Michael Ellison is a 78 y.o. male.  Pt. With a history of htn.  He complains of sob  The history is provided by the patient and medical records. No language interpreter was used.  Shortness of Breath Severity:  Moderate Onset quality:  Sudden Timing:  Constant Progression:  Waxing and waning Chronicity:  New Context: activity   Relieved by:  Nothing Worsened by:  Nothing Ineffective treatments:  None tried Associated symptoms: no abdominal pain, no chest pain, no cough, no headaches and no rash        Home Medications Prior to Admission medications   Medication Sig Start Date End Date Taking? Authorizing Provider  acetaminophen  (TYLENOL ) 500 MG tablet Take 500 mg by mouth every 6 (six) hours as needed for headache.    Yes [provider]  bisoprolol  (ZEBETA ) 5 MG tablet Take 5 mg by mouth daily.   Yes [provider]  furosemide  (LASIX ) 40 MG tablet Take 40 mg by mouth daily. 06/08/23  Yes [provider]  gabapentin  (NEURONTIN ) 300 MG capsule Take 300 mg by mouth daily as needed (for pain).   Yes [provider]  ipratropium-albuterol  (DUONEB) 0.5-2.5 (3) MG/3ML SOLN Take 3 mLs by nebulization every 6 (six) hours as needed (for shortness of breath/wheezing).    Yes [provider]  lubiprostone  (AMITIZA ) 24 MCG capsule Take one capsule once to twice daily for constipation. 04/29/23  Yes [name], PA-C  meclizine (ANTIVERT) 25 MG tablet Take 25 mg by mouth 3 (three) times daily. 05/04/23  Yes [provider]  meloxicam (MOBIC) 7.5 MG tablet Take 7.5 mg by mouth 2 (two) times daily. 04/04/23  Yes [provider]  olmesartan  (BENICAR ) 20 MG tablet Take 1 tablet (20 mg total) by mouth daily. 11/28/23  Yes Diamond Formica, MD   SYMBICORT  160-4.5 MCG/ACT inhaler Inhale 1 puff into the lungs 2 (two) times daily. 04/04/23  Yes [provider]  aspirin  EC 81 MG tablet Take 81 mg by mouth as needed. Takes 1 to 2 a month. Patient not taking: Reported on 09/14/2023    [provider]  bisoprolol  (ZEBETA ) 5 MG tablet Take 0.5 tablets (2.5 mg total) by mouth daily after supper. Patient not taking: Reported on 02/04/2024 06/28/23   [name], MD  GAVILYTE-G 236 g solution SMARTSIG:Milliliter(s) By Mouth Patient not taking: Reported on 02/04/2024 07/07/23   [provider]  mesalamine  (LIALDA ) 1.2 g EC tablet Take 4 tablets (4.8 g total) by mouth daily with breakfast. Patient not taking: Reported on 02/04/2024 08/25/23   [name], MD      Allergies    Lorazepam     Review of Systems   Review of Systems  Constitutional:  Negative for appetite change and fatigue.  HENT:  Negative for congestion, ear discharge and sinus pressure.   Eyes:  Negative for discharge.  Respiratory:  Positive for shortness of breath. Negative for cough.   Cardiovascular:  Negative for chest pain.  Gastrointestinal:  Negative for abdominal pain and diarrhea.  Genitourinary:  Negative for frequency and hematuria.  Musculoskeletal:  Negative for back pain.  Skin:  Negative for rash.  Neurological:  Negative for seizures and headaches.  Psychiatric/Behavioral:  Negative for hallucinations.  Physical Exam Updated Vital Signs BP 132/72 (BP Location: Right Arm)   Pulse 83   Temp 98.5 F (36.9 C) (Oral)   Resp (!) 27   Ht 6' 2.5" (1.892 m)   Wt 95.3 kg   SpO2 95%   BMI 26.60 kg/m  Physical Exam Vitals and nursing note reviewed.  Constitutional:      Appearance: He is well-developed.  HENT:     Head: Normocephalic.     Nose: Nose normal.  Eyes:     General: No scleral icterus.    Conjunctiva/sclera: Conjunctivae normal.  Neck:     Thyroid : No thyromegaly.  Cardiovascular:     Rate and Rhythm:  Normal rate and regular rhythm.     Heart sounds: No murmur heard.    No friction rub. No gallop.  Pulmonary:     Breath sounds: No stridor. No wheezing or rales.     Comments: Decreased breath sounds on right Chest:     Chest wall: No tenderness.  Abdominal:     General: There is no distension.     Tenderness: There is no abdominal tenderness. There is no rebound.  Musculoskeletal:        General: Normal range of motion.     Cervical back: Neck supple.  Lymphadenopathy:     Cervical: No cervical adenopathy.  Skin:    Findings: No erythema or rash.  Neurological:     Mental Status: He is alert and oriented to person, place, and time.     Motor: No abnormal muscle tone.     Coordination: Coordination normal.  Psychiatric:        Behavior: Behavior normal.     ED Results / Procedures / Treatments   Labs (all labs ordered are listed, but only abnormal results are displayed) Labs Reviewed  MRSA NEXT GEN BY PCR, NASAL - Abnormal; Notable for the following components:      Result Value   MRSA by PCR Next Gen DETECTED (*)    All other components within normal limits  BASIC METABOLIC PANEL WITH GFR - Abnormal; Notable for the following components:   Glucose, Bld 103 (*)    All other components within normal limits  BLOOD GAS, VENOUS - Abnormal; Notable for the following components:   pO2, Ven <31 (*)    Bicarbonate 29.0 (*)    All other components within normal limits  HEPATIC FUNCTION PANEL - Abnormal; Notable for the following components:   Total Protein 8.3 (*)    All other components within normal limits  BRAIN NATRIURETIC PEPTIDE - Abnormal; Notable for the following components:   B Natriuretic Peptide 123.0 (*)    All other components within normal limits  CBC WITH DIFFERENTIAL/PLATELET - Abnormal; Notable for the following components:   RBC 6.08 (*)    RDW 17.2 (*)    All other components within normal limits  CBC - Abnormal; Notable for the following components:    WBC 13.9 (*)    RDW 16.2 (*)    All other components within normal limits  COMPREHENSIVE METABOLIC PANEL WITH GFR - Abnormal; Notable for the following components:   Glucose, Bld 104 (*)    BUN 29 (*)    Creatinine, Ser 1.25 (*)    Albumin  3.4 (*)    GFR, Estimated 60 (*)    All other components within normal limits  BLOOD GAS, ARTERIAL - Abnormal; Notable for the following components:   pO2, Arterial 70 (*)    All  other components within normal limits  CBC - Abnormal; Notable for the following components:   WBC 12.5 (*)    Hemoglobin 12.6 (*)    RDW 15.8 (*)    All other components within normal limits  BASIC METABOLIC PANEL WITH GFR - Abnormal; Notable for the following components:   Sodium 133 (*)    Glucose, Bld 121 (*)    BUN 29 (*)    Creatinine, Ser 1.32 (*)    Calcium 8.1 (*)    GFR, Estimated 56 (*)    All other components within normal limits  POCT I-STAT 7, (LYTES, BLD GAS, ICA,H+H) - Abnormal; Notable for the following components:   pH, Arterial 7.298 (*)    pCO2 arterial 50.3 (*)    pO2, Arterial 72 (*)    All other components within normal limits  RESP PANEL BY RT-PCR (RSV, FLU A&B, COVID)  RVPGX2  PROTIME-INR  TYPE AND SCREEN  PREPARE RBC (CROSSMATCH)  SURGICAL PATHOLOGY    EKG EKG Interpretation Date/Time:  Saturday February 04 2024 07:01:50 EDT Ventricular Rate:  85 PR Interval:  219 QRS Duration:  95 QT Interval:  353 QTC Calculation: 420 R Axis:   76  Text Interpretation: Sinus rhythm Borderline prolonged PR interval Minimal ST elevation, inferior leads Confirmed by Cheyenne Cotta [phone]) on 02/04/2024 7:56:23 AM  Radiology DG Chest Port 1 View Result Date: 02/07/2024 CLINICAL DATA:  Status post left partial lobectomy EXAM: PORTABLE CHEST 1 VIEW COMPARISON:  Film from the previous day. FINDINGS: Postsurgical changes are noted on the left with a chest tube in place. No sizable pneumothorax is noted. Subcutaneous emphysema is again noted and stable.  Central vascular congestion is seen mild right basilar atelectasis is noted. IMPRESSION: Chest tube in place on the left.  No pneumothorax is noted. Mild vascular congestion is noted with right basilar atelectasis. Electronically Signed   By: Violeta Grey M.D.   On: 02/07/2024 10:32   DG Chest Port 1 View Result Date: 02/06/2024 CLINICAL DATA:  Status post partial lobectomy. EXAM: PORTABLE CHEST 1 VIEW COMPARISON:  CT of the chest 02/04/2024.  Chest x-ray 02/06/2004. FINDINGS: Left-sided chest tube has been replaced with distal tip near the apex. No pneumothorax visualized. Left chest wall emphysema is unchanged. There are increasing patchy airspace and interstitial opacities in the left upper lobe and left mid lung. Right lung is grossly clear and unchanged. The cardiomediastinal silhouette is stable, the heart is enlarged. There is a questionable acute fracture of the left eighth rib intra not definitely seen on prior chest CT. IMPRESSION: 1. Left-sided chest tube has been replaced with distal tip near the apex. No pneumothorax visualized. 2. Increasing patchy airspace and interstitial opacities in the left upper lobe and left mid lung. 3. Questionable acute fracture of the left eighth rib. Electronically Signed   By: Tyron Gallon M.D.   On: 02/06/2024 16:40   DG Chest Port 1 View Result Date: 02/06/2024 CLINICAL DATA:  Pneumothorax EXAM: PORTABLE CHEST 1 VIEW COMPARISON:  02/05/2024 FINDINGS: Single frontal view of the chest demonstrates stable position of the left-sided pigtail pleural drainage catheter. There is persistent loculated left basilar pneumothorax, slightly increased since prior study. No midline shift or tension effect. Cardiac silhouette remains enlarged. Continued pulmonary vascular congestion. Background emphysema again noted. Increased subcutaneous gas within the left lateral chest wall. IMPRESSION: 1. Left-sided pigtail pleural drainage catheter, with slight increase in the loculated  left basilar pneumothorax seen previously. No tension effect or midline shift. 2.  Increased subcutaneous gas within the left lateral chest wall, which may signify air leak or recent drainage catheter manipulation. Electronically Signed   By: Bobbye Burrow M.D.   On: 02/06/2024 10:22   DG Abd 1 View Result Date: 02/05/2024 CLINICAL DATA:  Distension EXAM: ABDOMEN - 1 VIEW COMPARISON:  04/26/2023, chest x-ray 02/05/2024 FINDINGS: Left lower chest tube with small pneumothorax at the left base and CP angle. Nonobstructed gas pattern. No radiopaque calculi. IMPRESSION: 1. Nonobstructed gas pattern. 2. Left lower chest tube with small pneumothorax at the left base and CP angle as seen on prior chest x-ray. Electronically Signed   By: Esmeralda Hedge M.D.   On: 02/05/2024 18:38    Procedures Procedures    Medications Ordered in ED Medications  albuterol  (PROVENTIL ) (2.5 MG/3ML) 0.083% nebulizer solution 2.5 mg ( Nebulization MAR Unhold 02/06/24 1355)  dextromethorphan -guaiFENesin  (MUCINEX  DM) 30-600 MG per 12 hr tablet 1 tablet (1 tablet Oral Given 02/07/24 1015)  azithromycin  (ZITHROMAX ) tablet 500 mg (500 mg Oral Given 02/07/24 1015)  aspirin  EC tablet 81 mg (81 mg Oral Given 02/07/24 1015)  bisoprolol  (ZEBETA ) tablet 2.5 mg (2.5 mg Oral Given 02/06/24 1736)  irbesartan  (AVAPRO ) tablet 75 mg (75 mg Oral Given 02/07/24 1015)  lubiprostone  (AMITIZA ) capsule 24 mcg (24 mcg Oral Given 02/07/24 1016)  mesalamine  (LIALDA ) EC tablet 4.8 g (4.8 g Oral Given 02/07/24 1016)  tamsulosin  (FLOMAX ) capsule 0.4 mg (0.4 mg Oral Given 02/07/24 1020)  gabapentin  (NEURONTIN ) capsule 300 mg (300 mg Oral Given 02/07/24 1015)  sodium chloride  flush (NS) 0.9 % injection 3 mL (3 mLs Intravenous Given 02/07/24 1017)  sodium chloride  flush (NS) 0.9 % injection 3 mL ( Intravenous MAR Unhold 02/06/24 1355)  0.9 %  sodium chloride  infusion (has no administration in time range)  acetaminophen  (TYLENOL ) tablet 650 mg (650 mg Oral Given  02/06/24 1735)    Or  acetaminophen  (TYLENOL ) suppository 650 mg ( Rectal See Alternative 02/06/24 1735)  traZODone  (DESYREL ) tablet 50 mg ( Oral MAR Unhold 02/06/24 1355)  polyethylene glycol (MIRALAX  / GLYCOLAX ) packet 17 g ( Oral MAR Unhold 02/06/24 1355)  bisacodyl  (DULCOLAX) suppository 10 mg ( Rectal MAR Unhold 02/06/24 1355)  ondansetron  (ZOFRAN ) tablet 4 mg ( Oral MAR Unhold 02/06/24 1355)    Or  ondansetron  (ZOFRAN ) injection 4 mg ( Intravenous MAR Unhold 02/06/24 1355)  heparin  injection 5,000 Units (5,000 Units Subcutaneous Given 02/07/24 0534)  fentaNYL  (SUBLIMAZE ) injection 25 mcg ( Intravenous MAR Unhold 02/06/24 1355)  budeson-glycopyrrolate -formoterol  (BREZTRI ) 160-9-4.8 MCG/ACT inhaler 2 puff (2 puffs Inhalation Given 02/07/24 0824)  ketorolac  (TORADOL ) 15 MG/ML injection 15 mg ( Intravenous MAR Unhold 02/06/24 1355)  methocarbamol  (ROBAXIN ) tablet 500 mg (500 mg Oral Given 02/07/24 1015)  mupirocin  ointment (BACTROBAN ) 2 % 1 Application (1 Application Nasal Given 02/07/24 1017)  Chlorhexidine  Gluconate Cloth 2 % PADS 6 each (6 each Topical Given 02/07/24 1017)  bisacodyl  (DULCOLAX) EC tablet 10 mg (10 mg Oral Given 02/07/24 1015)  senna-docusate (Senokot-S) tablet 1 tablet (1 tablet Oral Given 02/06/24 2111)  pantoprazole  (PROTONIX ) EC tablet 40 mg (40 mg Oral Given 02/07/24 1015)  oxyCODONE  (Oxy IR/ROXICODONE ) immediate release tablet 5-10 mg (10 mg Oral Given 02/07/24 1015)  ipratropium-albuterol  (DUONEB) 0.5-2.5 (3) MG/3ML nebulizer solution 3 mL (3 mLs Nebulization Given 02/07/24 0824)  methylPREDNISolone  sodium succinate  (SOLU-MEDROL ) 125 mg/2 mL injection 125 mg (125 mg Intravenous Given 02/04/24 0720)  ipratropium-albuterol  (DUONEB) 0.5-2.5 (3) MG/3ML nebulizer solution 3 mL (3 mLs Nebulization Given 02/04/24 0718)  albuterol  (PROVENTIL ) (  2.5 MG/3ML) 0.083% nebulizer solution 2.5 mg (2.5 mg Nebulization Given 02/04/24 0718)  magnesium  sulfate IVPB 2 g 50 mL (0 g Intravenous Stopped 02/04/24  0830)  ceFAZolin  (ANCEF ) IVPB 2g/100 mL premix (2 g Intravenous Given 02/06/24 0746)  chlorhexidine  (PERIDEX ) 0.12 % solution 15 mL (15 mLs Mouth/Throat Given 02/06/24 0704)    Or  Oral care mouth rinse ( Mouth Rinse See Alternative 02/06/24 0704)  ceFAZolin  (ANCEF ) IVPB 2g/100 mL premix (2 g Intravenous New Bag/Given 02/06/24 2111)    ED Course/ Medical Decision Making/ A&P CRITICAL CARE Performed by: Cheyenne Cotta Total critical care time: 44 minutes Critical care time was exclusive of separately billable procedures and treating other patients. Critical care was necessary to treat or prevent imminent or life-threatening deterioration. Critical care was time spent personally by me on the following activities: development of treatment plan with patient and/or surrogate as well as nursing, discussions with consultants, evaluation of patient's response to treatment, examination of patient, obtaining history from patient or surrogate, ordering and performing treatments and interventions, ordering and review of laboratory studies, ordering and review of radiographic studies, pulse oximetry and re-evaluation of patient's condition.   The patient's daughter wanted the pt transferred to [facility] without doing anything here to help with his tension pneumothorax.  I told her we could not do that.  The pt could die and the ems would not transfer an unstable pt.  She was very upset with me.  Dr. Larrie Po put the chest tube in and he was admitted at [facility] Click here for ABCD2, HEART and other calculatorsREFRESH Note before signing :1}                              Medical Decision Making Amount and/or Complexity of Data Reviewed Labs: ordered. Radiology: ordered.  Risk Prescription drug management. Decision regarding hospitalization.   Tension pneumothorax,  chest tube placed and admission to [facility]        Final Clinical Impression(s) / ED Diagnoses Final diagnoses:  Tension pneumothorax    Rx / DC  Orders ED Discharge Orders     None         Cheyenne Cotta, MD 02/07/24 1059

## 2024-02-07 NOTE — Evaluation (Signed)
 Occupational Therapy Evaluation Patient Details Name: Michael Ellison MRN: [id number] DOB: 11-16-1945 Today's Date: 02/07/2024   History of Present Illness   78 y.o. male presents to the ED 02/04/24 with complaints of dyspnea and left-sided chest discomfort since 4/18. chest x-ray with 50% left-sided pneumothorax; L chest tube placed; 4/21 VATS with wedge resection RUL  PMH significant for COPD with chronic hypoxic respiratory failure using oxygen  at 2 to 3 L as needed, HTN, GERD , PSVT     Clinical Impressions Pt is typically independent in ADL and mobility without DME. He lives alone, manages his medicine and finances, still drives. Is retired from [employer]. Today he reports pain 5/10 from incision site/chest tube, but is still eager to work with therapy. He was supervision for bed mobility, required mod A for LB dressing, and GCA +2 safety and line management for SPT to recliner from bed. In standing he could pull up his briefs without UE support for balance. During session there was poor reading/wave form on SpO2 sensor - likely down to around 80% on 4L with activity so limited to transfer only today. Replaced sensor for improved wave form and SPO2 was 91% on 4L seated in chair with Pt reporting improved comfort. Overall Pt set up/GCA for standing UB ADL, min to mod A for LB ADL (will benefit from AE education next session as he lives alone) and will benefit from skilled OT in the acute setting as well as afterwards at the [facility] level to maximize safety and independence in ADL AND IADL.     If plan is discharge home, recommend the following:   A little help with walking and/or transfers;A little help with bathing/dressing/bathroom;Assistance with cooking/housework;Assist for transportation     Functional Status Assessment   Patient has had a recent decline in their functional status and demonstrates the ability to make significant improvements in function in a reasonable and  predictable amount of time.     Equipment Recommendations   None recommended by OT (Pt has appropriate DME)     Recommendations for Other Services   PT consult     Precautions/Restrictions   Precautions Precautions: Fall Recall of Precautions/Restrictions: Intact Precaution/Restrictions Comments: L Chest Tube Restrictions Weight Bearing Restrictions Per Provider Order: No     Mobility Bed Mobility Overal bed mobility: Needs Assistance Bed Mobility: Sidelying to Sit   Sidelying to sit: HOB elevated, Contact guard assist       General bed mobility comments: GCA for line management, no physical assist needed    Transfers Overall transfer level: Needs assistance Equipment used: Rolling walker (2 wheels) Transfers: Sit to/from Stand, Bed to chair/wheelchair/BSC Sit to Stand: Contact guard assist, +2 safety/equipment     Step pivot transfers: Contact guard assist, +2 safety/equipment     General transfer comment: initial cues for safe hand placement with RW - then good follow through. no physical assist throughout transfers. +2 for line management only      Balance Overall balance assessment: Mild deficits observed, not formally tested                                         ADL either performed or assessed with clinical judgement   ADL Overall ADL's : Needs assistance/impaired Eating/Feeding: Modified independent   Grooming: Contact guard assist;Standing;Sitting   Upper Body Bathing: Minimal assistance;Sitting Upper Body Bathing Details (indicate cue type  and reason): would benefit from long handle sponge Lower Body Bathing: Moderate assistance;Sitting/lateral leans   Upper Body Dressing : Set up;Sitting Upper Body Dressing Details (indicate cue type and reason): extra gown like robe Lower Body Dressing: Moderate assistance;Sit to/from stand Lower Body Dressing Details (indicate cue type and reason): assist to thread briefs on Bil  legs, Pt can maintain standing/balance to pull up Toilet Transfer: Contact guard assist;+2 for safety/equipment;Rolling walker (2 wheels) Toilet Transfer Details (indicate cue type and reason): simulated through recliner transfer Toileting- Clothing Manipulation and Hygiene: Supervision/safety;Sitting/lateral lean   Tub/ Shower Transfer: Contact guard assist;+2 for safety/equipment;Rolling walker (2 wheels)   Functional mobility during ADLs: Contact guard assist;+2 for safety/equipment;Rolling walker (2 wheels) General ADL Comments: decreased access to LB for ADL due to pain from chest tube, decreased activity tolerance     Vision Baseline Vision/History: 1 Wears glasses Ability to See in Adequate Light: 0 Adequate Patient Visual Report: No change from baseline ("I do need to go see my eye MD") Vision Assessment?: No apparent visual deficits;Wears glasses for reading     Perception         Praxis         Pertinent Vitals/Pain Pain Assessment Pain Assessment: 0-10 Pain Score: 5  Pain Location: incicision site Pain Descriptors / Indicators: Discomfort, Grimacing, Sore Pain Intervention(s): Monitored during session, Repositioned, Patient requesting pain meds-RN notified     Extremity/Trunk Assessment Upper Extremity Assessment Upper Extremity Assessment: Overall WFL for tasks assessed   Lower Extremity Assessment Lower Extremity Assessment: Defer to PT evaluation   Cervical / Trunk Assessment Cervical / Trunk Assessment: Other exceptions Cervical / Trunk Exceptions: L chest tube   Communication Communication Communication: No apparent difficulties   Cognition Arousal: Alert Behavior During Therapy: WFL for tasks assessed/performed Cognition: No apparent impairments             OT - Cognition Comments: very pleasant, motivated, good follow through on cues                 Following commands: Intact       Cueing  General Comments   Cueing Techniques:  Verbal cues;Gestural cues  VSS throughout session. on 4L O2 inaccurate wave form. down to 80% with sit<>stand and small pivotal steps. when sensor switched with improved pleth in seated position, SpO2 was 90%.   Exercises     Shoulder Instructions      Home Living Family/patient expects to be discharged to:: Private residence Living Arrangements: Alone Available Help at Discharge: Family;Available PRN/intermittently Type of Home: House Home Access: Level entry     Home Layout: One level     Bathroom Shower/Tub: Producer, television/film/video: Standard Bathroom Accessibility: Yes How Accessible: Accessible via walker Home Equipment: Rolling Walker (2 wheels);Grab bars - tub/shower;Hand held shower head;Cane - single point;Shower seat          Prior Functioning/Environment Prior Level of Function : Independent/Modified Independent;Driving             Mobility Comments: no DME, 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[employer]) ADLs Comments: independent, cooks, cleans, manages meds and fincances    OT Problem List: Decreased activity tolerance;Impaired balance (sitting and/or standing);Decreased safety awareness;Decreased knowledge of use of DME or AE;Decreased knowledge of precautions;Cardiopulmonary status limiting activity;Pain   OT Treatment/Interventions: Self-care/ADL training;Energy conservation;DME and/or AE instruction;Therapeutic activities;Patient/family education;Balance training      OT Goals(Current goals can be found in the care plan section)   Acute Rehab OT Goals Patient  Stated Goal: be independent OT Goal Formulation: With patient Time For Goal Achievement: 02/21/24 Potential to Achieve Goals: Good ADL Goals Pt Will Perform Grooming: with modified independence;standing Pt Will Perform Lower Body Bathing: with supervision;sit to/from stand;with adaptive equipment Pt Will Perform Upper Body Dressing: with modified independence;sitting Pt Will Perform Lower Body  Dressing: with modified independence;with adaptive equipment;sit to/from stand Pt Will Transfer to Toilet: with modified independence;ambulating Pt Will Perform Toileting - Clothing Manipulation and hygiene: with modified independence;sitting/lateral leans;sit to/from stand Additional ADL Goal #1: Pt will verbalize at least 3 strategies for energy conservation during ADL with no cues   OT Frequency:  Min 2X/week    Co-evaluation PT/OT/SLP Co-Evaluation/Treatment: Yes Reason for Co-Treatment: For patient/therapist safety;To address functional/ADL transfers PT goals addressed during session: Mobility/safety with mobility;Balance;Proper use of DME;Strengthening/ROM OT goals addressed during session: ADL's and self-care;Strengthening/ROM;Proper use of Adaptive equipment and DME      AM-PAC OT "6 Clicks" Daily Activity     Outcome Measure Help from another person eating meals?: None Help from another person taking care of personal grooming?: A Little Help from another person toileting, which includes using toliet, bedpan, or urinal?: A Little Help from another person bathing (including washing, rinsing, drying)?: A Lot Help from another person to put on and taking off regular upper body clothing?: A Little Help from another person to put on and taking off regular lower body clothing?: A Little 6 Click Score: 18   End of Session Equipment Utilized During Treatment: Oxygen ;Rolling walker (2 wheels) Nurse Communication: Mobility status;Precautions;Patient requests pain meds  Activity Tolerance: Patient tolerated treatment well Patient left: in chair;with call bell/phone within reach;with chair alarm set  OT Visit Diagnosis: Muscle weakness (generalized) (M62.81);Pain;Other abnormalities of gait and mobility (R26.89) Pain - Right/Left: Left Pain - part of body:  (chest)                Time: 6295-2841 OT Time Calculation (min): 28 min Charges:  OT General Charges $OT Visit: 1 Visit OT  Evaluation $OT Eval Moderate Complexity: 1 Mod [place] OTR/L Acute Rehabilitation Services Office: [phone]   Ebony Goldstein [facility] 02/07/2024, 10:56 AM

## 2024-02-07 NOTE — Plan of Care (Signed)
  Problem: Clinical Measurements: Goal: Cardiovascular complication will be avoided Outcome: Progressing   Problem: Activity: Goal: Risk for activity intolerance will decrease Outcome: Progressing   Problem: Coping: Goal: Level of anxiety will decrease Outcome: Progressing   Problem: Pain Managment: Goal: General experience of comfort will improve and/or be controlled Outcome: Progressing   Problem: Safety: Goal: Ability to remain free from injury will improve Outcome: Progressing

## 2024-02-08 ENCOUNTER — Inpatient Hospital Stay (HOSPITAL_COMMUNITY)

## 2024-02-08 DIAGNOSIS — J9383 Other pneumothorax: Secondary | ICD-10-CM | POA: Diagnosis not present

## 2024-02-08 LAB — COMPREHENSIVE METABOLIC PANEL WITH GFR
ALT: 12 U/L (ref 0–44)
AST: 22 U/L (ref 15–41)
Albumin: 2.9 g/dL — ABNORMAL LOW (ref 3.5–5.0)
Alkaline Phosphatase: 36 U/L — ABNORMAL LOW (ref 38–126)
Anion gap: 8 (ref 5–15)
BUN: 26 mg/dL — ABNORMAL HIGH (ref 8–23)
CO2: 26 mmol/L (ref 22–32)
Calcium: 8.4 mg/dL — ABNORMAL LOW (ref 8.9–10.3)
Chloride: 99 mmol/L (ref 98–111)
Creatinine, Ser: 1.11 mg/dL (ref 0.61–1.24)
GFR, Estimated: >60 mL/min (ref >60)
Glucose, Bld: 90 mg/dL (ref 70–99)
Potassium: 4.3 mmol/L (ref 3.5–5.1)
Sodium: 133 mmol/L — ABNORMAL LOW (ref 135–145)
Total Bilirubin: 1.4 mg/dL — ABNORMAL HIGH (ref 0.0–1.2)
Total Protein: 6 g/dL — ABNORMAL LOW (ref 6.5–8.1)

## 2024-02-08 LAB — CBC
HCT: 39.7 % (ref 39.0–52.0)
Hemoglobin: 12.8 g/dL — ABNORMAL LOW (ref 13.0–17.0)
MCH: 26.7 pg (ref 26.0–34.0)
MCHC: 32.2 g/dL (ref 30.0–36.0)
MCV: 82.7 fL (ref 80.0–100.0)
Platelets: 204 10*3/uL (ref 150–400)
RBC: 4.8 MIL/uL (ref 4.22–5.81)
RDW: 15.8 % — ABNORMAL HIGH (ref 11.5–15.5)
WBC: 14.1 10*3/uL — ABNORMAL HIGH (ref 4.0–10.5)
nRBC: 0 % (ref 0.0–0.2)

## 2024-02-08 NOTE — Progress Notes (Signed)
 PROGRESS NOTE    Michael MALLON  Ellison:[id number] DOB: 1946/03/25 DOA: 02/04/2024 PCP: Wyvonna Heidelberg, MD    Brief Narrative:   Michael Ellison is a 78 y.o. male with past medical history significant for COPD/emphysema with bullae, chronic hypoxic respite failure on 2-3 L nasal cannula at baseline, HTN, PSVT, GERD, reformed tobacco abuse disorder who presented as a transfer from [facility] ED with acute left-sided tension pneumothorax.  Chest tube was placed at [facility] and transferred to [facility] for further evaluation and management by cardiothoracic surgery.  Patient underwent robotic assisted right video-assisted thoracoscopy with resection of multiple blebs and lysis of adhesions by [name] on 02/06/2024.  Assessment & Plan:   Acute on chronic respiratory failure Left-sided tension pneumothorax, acute History of COPD/emphysema with bullae Patient initially presenting to the ED with acute shortness of breath.  Imaging notable for acute tension pneumothorax and chest tube was placed.  Patient was transferred to [facility] for evaluation under the cardiothoracic surgery service.  Patient underwent robotic assisted right video-assisted thorascopic with resection of multiple blebs and lysis of adhesions by [name] on 02/06/2024. Pathology from bleb resection; emphysematous bleb, negative for malignancy -- Continue supplemental oxygen , maintain SpO2 greater than 88%, baseline 2-3L PRN -- Continues with chest tube to water  seal today -- Monitor chest tube output, 230 mL past 24 hours -- CXR in the am  COPD/emphysema -- Bretztri 2 puffs twice daily (on Symbicort  outpatient) -- DuoNeb 3 times daily -- Albuterol  neb every 2 hours PRN wheezing/shortness of breath  Essential hypertension History of PSVT -- Irbesartan  75 mg p.o. daily -- Bisoprolol  2 point milligrams p.o. daily  BPH -- Tamsulosin  0.4 g p.o.  daily  Constipation -- Senokot-S1 tablet p.o. twice daily -- Lubiprostone  24 mcg PO daily -- MiraLAX  daily as needed mild constipation -- Dulcolax suppository daily/PRN moderate constipation   DVT prophylaxis: SCD's Start: 02/06/24 1357 heparin  injection 5,000 Units Start: 02/04/24 1400 SCDs Start: 02/04/24 1007 Place TED hose Start: 02/04/24 1007    Code Status: Full Code Family Communication: No family present at bedside this morning  Disposition Plan:  Level of care: Progressive Status is: Inpatient Remains inpatient appropriate because: Remains with chest tube in place    Consultants:  PCCM CTS  Procedures:  VATS w/ bleb resection, lysis of adhesions 4/21, CTS; [name]  Antimicrobials:  Azithromycin  4/19>> Cefazolin  4/21 - 4/21   Subjective: Patient seen examined bedside, lying in bed.  No complaints this morning.  Seen by CTS, chest tube now placed on waterseal.  Too much drainage continues for ability for removal today.  No other specific questions, concerns or complaints at this time.  Denies headache, no dizziness, no chest pain, no palpitations, no current shortness of breath, no fever/chills/night sweats, no nausea/vomiting/diarrhea, no focal weakness, no fatigue, no paresthesias.  No acute events overnight per nursing.  Objective: Vitals:   02/08/24 0253 02/08/24 0748 02/08/24 0835 02/08/24 1058  BP: 130/78 (!) 140/69  108/66  Pulse: 85 87  92  Resp: (!) 24 19  (!) 27  Temp: 98.9 F (37.2 C) 98.9 F (37.2 C)  98.7 F (37.1 C)  TempSrc: Oral Oral  Oral  SpO2: 95% 93% 95% 90%  Weight:      Height:        Intake/Output Summary (Last 24 hours) at 02/08/2024 1228 Last data filed at 02/08/2024 0830 Gross per 24 hour  Intake 680 ml  Output 1970  ml  Net -1290 ml   Filed Weights   02/04/24 0659  Weight: 95.3 kg    Examination:  Physical Exam: GEN: NAD, alert and oriented x 3, elderly in appearance HEENT: NCAT, PERRL, EOMI, sclera clear,  MMM PULM: Breath sound slight diminished breath bases, no wheezes/crackles, normal respiratory effort without accessory muscle use, 4 L nasal cannula with SPO2 95% at rest, chest tube noted to suction CV: RRR w/o M/G/R GI: abd soft, NTND, NABS, no R/G/M MSK: no peripheral edema, muscle strength globally intact 5/5 bilateral upper/lower extremities NEURO: CN II-XII intact, no focal deficits, sensation to light touch intact PSYCH: normal mood/affect Integumentary: dry/intact, no rashes or wounds    Data Reviewed: I have personally reviewed following labs and imaging studies  CBC: Recent Labs  Lab 02/04/24 0705 02/05/24 1525 02/06/24 1058 02/07/24 0301 02/08/24 0808  WBC 9.6 13.9*  --  12.5* 14.1*  NEUTROABS 6.5  --   --   --   --   HGB 16.3 15.3 13.3 12.6* 12.8*  HCT 50.7 47.5 39.0 39.5 39.7  MCV 83.4 82.6  --  82.8 82.7  PLT 227 215  --  183 204   Basic Metabolic Panel: Recent Labs  Lab 02/04/24 0705 02/05/24 1525 02/06/24 1058 02/07/24 0301 02/08/24 0808  NA 138 138 139 133* 133*  K 4.4 4.2 4.8 5.1 4.3  CL 106 105  --  103 99  CO2 24 22  --  25 26  GLUCOSE 103* 104*  --  121* 90  BUN 15 29*  --  29* 26*  CREATININE 1.02 1.25*  --  1.32* 1.11  CALCIUM 9.5 9.1  --  8.1* 8.4*   GFR: Estimated Creatinine Clearance: 66.8 mL/min (by C-G formula based on SCr of 1.11 mg/dL). Liver Function Tests: Recent Labs  Lab 02/04/24 0705 02/05/24 1525 02/08/24 0808  AST 17 16 22   ALT 16 14 12   ALKPHOS 55 43 36*  BILITOT 0.6 0.9 1.4*  PROT 8.3* 6.9 6.0*  ALBUMIN  4.1 3.4* 2.9*   No results for input(s): "LIPASE", "AMYLASE" in the last 168 hours. No results for input(s): "AMMONIA" in the last 168 hours. Coagulation Profile: Recent Labs  Lab 02/05/24 1525  INR 1.2   Cardiac Enzymes: No results for input(s): "CKTOTAL", "CKMB", "CKMBINDEX", "TROPONINI" in the last 168 hours. BNP (last 3 results) No results for input(s): "PROBNP" in the last 8760 hours. HbA1C: No  results for input(s): "HGBA1C" in the last 72 hours. CBG: No results for input(s): "GLUCAP" in the last 168 hours. Lipid Profile: No results for input(s): "CHOL", "HDL", "LDLCALC", "TRIG", "CHOLHDL", "LDLDIRECT" in the last 72 hours. Thyroid  Function Tests: No results for input(s): "TSH", "T4TOTAL", "FREET4", "T3FREE", "THYROIDAB" in the last 72 hours. Anemia Panel: No results for input(s): "VITAMINB12", "FOLATE", "FERRITIN", "TIBC", "IRON", "RETICCTPCT" in the last 72 hours. Sepsis Labs: No results for input(s): "PROCALCITON", "LATICACIDVEN" in the last 168 hours.  Recent Results (from the past 240 hours)  Resp panel by RT-PCR (RSV, Flu A&B, Covid) Anterior Nasal Swab     Status: None   Collection Time: 02/04/24  7:30 AM   Specimen: Anterior Nasal Swab  Result Value Ref Range Status   SARS Coronavirus 2 by RT PCR NEGATIVE NEGATIVE Final    Comment: (NOTE) SARS-CoV-2 target nucleic acids are NOT DETECTED.  The SARS-CoV-2 RNA is generally detectable in upper respiratory specimens during the acute phase of infection. The lowest concentration of SARS-CoV-2 viral copies this assay can detect is 138  copies/mL. A negative result does not preclude SARS-Cov-2 infection and should not be used as the sole basis for treatment or other patient management decisions. A negative result may occur with  improper specimen collection/handling, submission of specimen other than nasopharyngeal swab, presence of viral mutation(s) within the areas targeted by this assay, and inadequate number of viral copies(<138 copies/mL). A negative result must be combined with clinical observations, patient history, and epidemiological information. The expected result is Negative.  Fact Sheet for Patients:  BloggerCourse.com  Fact Sheet for Healthcare Providers:  SeriousBroker.it  This test is no t yet approved or cleared by the [place]  FDA and  has been  authorized for detection and/or diagnosis of SARS-CoV-2 by FDA under an Emergency Use Authorization (EUA). This EUA will remain  in effect (meaning this test can be used) for the duration of the COVID-19 declaration under Section 564(b)(1) of the Act, 21 U.S.C.section 360bbb-3(b)(1), unless the authorization is terminated  or revoked sooner.       Influenza A by PCR NEGATIVE NEGATIVE Final   Influenza B by PCR NEGATIVE NEGATIVE Final    Comment: (NOTE) The Xpert Xpress SARS-CoV-2/FLU/RSV plus assay is intended as an aid in the diagnosis of influenza from Nasopharyngeal swab specimens and should not be used as a sole basis for treatment. Nasal washings and aspirates are unacceptable for Xpert Xpress SARS-CoV-2/FLU/RSV testing.  Fact Sheet for Patients: BloggerCourse.com  Fact Sheet for Healthcare Providers: SeriousBroker.it  This test is not yet approved or cleared by the [place]  FDA and has been authorized for detection and/or diagnosis of SARS-CoV-2 by FDA under an Emergency Use Authorization (EUA). This EUA will remain in effect (meaning this test can be used) for the duration of the COVID-19 declaration under Section 564(b)(1) of the Act, 21 U.S.C. section 360bbb-3(b)(1), unless the authorization is terminated or revoked.     Resp Syncytial Virus by PCR NEGATIVE NEGATIVE Final    Comment: (NOTE) Fact Sheet for Patients: BloggerCourse.com  Fact Sheet for Healthcare Providers: SeriousBroker.it  This test is not yet approved or cleared by the [place]  FDA and has been authorized for detection and/or diagnosis of SARS-CoV-2 by FDA under an Emergency Use Authorization (EUA). This EUA will remain in effect (meaning this test can be used) for the duration of the COVID-19 declaration under Section 564(b)(1) of the Act, 21 U.S.C. section 360bbb-3(b)(1), unless the  authorization is terminated or revoked.  Performed at [facility], [address], [place], [place] [address]   MRSA Next Gen by PCR, Nasal     Status: Abnormal   Collection Time: 02/04/24  2:02 PM   Specimen: Nasal Mucosa; Nasal Swab  Result Value Ref Range Status   MRSA by PCR Next Gen DETECTED (A) NOT DETECTED Final    Comment: CRITICAL RESULT CALLED TO, READ BACK BY AND VERIFIED WITH: RN MONICA SIXTOS [id number] BY J RAZZAK, MT (NOTE) The GeneXpert MRSA Assay (FDA approved for NASAL specimens only), is one component of a comprehensive MRSA colonization surveillance program. It is not intended to diagnose MRSA infection nor to guide or monitor treatment for MRSA infections. Test performance is not FDA approved in patients less than 71 years old. Performed at [facility], [address], [place], [place] [address]          Radiology Studies: DG CHEST PORT 1 VIEW Result Date: 02/08/2024 CLINICAL DATA:  Status post lung surgery, follow-up EXAM: PORTABLE CHEST 1 VIEW COMPARISON:  02/07/2024 FINDINGS: Postoperative changes on  the left. Left chest tube remains in place. No pneumothorax. Heart is mildly enlarged. Mild vascular congestion. Diffuse left lung airspace opacities and right base atelectasis. Small bilateral effusions. Left subcutaneous emphysema is unchanged. IMPRESSION: Postoperative changes on the left with stable position of left chest tube and no visible pneumothorax. Diffuse left lung airspace disease could reflect atelectasis or infiltrate. Right base opacity, likely atelectasis. Small bilateral effusions. Cardiomegaly, vascular congestion. Electronically Signed   By: Janeece Mechanic M.D.   On: 02/08/2024 09:47   DG Chest Port 1 View Result Date: 02/07/2024 CLINICAL DATA:  Status post left partial lobectomy EXAM: PORTABLE CHEST 1 VIEW COMPARISON:  Film from the previous day. FINDINGS: Postsurgical changes are noted on the left with a chest tube in place. No sizable  pneumothorax is noted. Subcutaneous emphysema is again noted and stable. Central vascular congestion is seen mild right basilar atelectasis is noted. IMPRESSION: Chest tube in place on the left.  No pneumothorax is noted. Mild vascular congestion is noted with right basilar atelectasis. Electronically Signed   By: Violeta Grey M.D.   On: 02/07/2024 10:32        Scheduled Meds:  aspirin  EC  81 mg Oral Q breakfast   azithromycin   500 mg Oral Daily   bisacodyl   10 mg Oral Daily   bisoprolol   2.5 mg Oral QPC supper   budeson-glycopyrrolate -formoterol   2 puff Inhalation BID   Chlorhexidine  Gluconate Cloth  6 each Topical Daily   dextromethorphan -guaiFENesin   1 tablet Oral BID   gabapentin   300 mg Oral BID   heparin   5,000 Units Subcutaneous Q8H   ipratropium-albuterol   3 mL Nebulization BID   irbesartan   75 mg Oral Daily   lubiprostone   24 mcg Oral Q breakfast   mesalamine   4.8 g Oral Q breakfast   mupirocin  ointment  1 Application Nasal BID   pantoprazole   40 mg Oral Daily   senna-docusate  1 tablet Oral BID   sodium chloride  flush  3 mL Intravenous Q12H   tamsulosin   0.4 mg Oral Daily   Continuous Infusions:   LOS: 4 days    Time spent: 51 minutes spent on 02/08/2024 caring for this patient face-to-face including chart review, ordering labs/tests, documenting, discussion with nursing staff, consultants, updating family and interview/physical exam    Rema Care [place], DO [facility] Available via Epic secure chat 7am-7pm After these hours, please refer to coverage provider listed on [email or web address] 02/08/2024, 12:28 PM

## 2024-02-08 NOTE — Plan of Care (Signed)
  Problem: Education: Goal: Knowledge of General Education information will improve Description: Including pain rating scale, medication(s)/side effects and non-pharmacologic comfort measures Outcome: Progressing   Problem: Clinical Measurements: Goal: Diagnostic test results will improve Outcome: Progressing Goal: Respiratory complications will improve Outcome: Progressing Goal: Cardiovascular complication will be avoided Outcome: Progressing   Problem: Nutrition: Goal: Adequate nutrition will be maintained Outcome: Progressing   Problem: Pain Managment: Goal: General experience of comfort will improve and/or be controlled Outcome: Progressing   Problem: Cardiac: Goal: Will achieve and/or maintain hemodynamic stability Outcome: Progressing   Problem: Clinical Measurements: Goal: Postoperative complications will be avoided or minimized Outcome: Progressing   Problem: Respiratory: Goal: Respiratory status will improve Outcome: Progressing   Problem: Pain Management: Goal: Pain level will decrease Outcome: Progressing

## 2024-02-08 NOTE — Progress Notes (Signed)
 Physical Therapy Treatment Patient Details Name: Michael Ellison MRN: [id number] DOB: [birth year] Today's Date: 02/08/2024   History of Present Illness 78 y.o. male presents to the ED 02/04/24 with complaints of dyspnea and left-sided chest discomfort since 4/18. chest x-ray with 50% left-sided pneumothorax; L chest tube placed; 4/21 VATS with wedge resection RUL  PMH significant for COPD with chronic hypoxic respiratory failure using oxygen  at 2 to 3 L as needed, HTN, GERD , PSVT    PT Comments  Pt resting in bed on arrival, pleasant and agreeable to therapy session, continuing to progress towards acute goals. Pt demonstrated bed mobility and sit<>stand with CGA for safety/line management. Pt with desaturation on initial sit>stand at ~78% with pt c/o mild SOB, however >88% after seated break and no c/o SOB. Pt able to progress gait tolerance with RW, SPO2 at 87%-93% during activity, when good pleth available, and supervision for safety/line management. Pt continues to benefit from skilled PT services to progress toward functional mobility goals.       If plan is discharge home, recommend the following: A little help with walking and/or transfers;Assistance with cooking/housework;Assist for transportation   Can travel by private vehicle        Equipment Recommendations  None recommended by PT    Recommendations for Other Services       Precautions / Restrictions Precautions Precautions: Fall Recall of Precautions/Restrictions: Intact Precaution/Restrictions Comments: L Chest Tube Restrictions Weight Bearing Restrictions Per Provider Order: No     Mobility  Bed Mobility Overal bed mobility: Needs Assistance Bed Mobility: Supine to Sit   Sidelying to sit: HOB elevated, Contact guard assist       General bed mobility comments: for line management, no physical assist needed    Transfers Overall transfer level: Needs assistance Equipment used: Rolling walker (2  wheels) Transfers: Sit to/from Stand, Bed to chair/wheelchair/BSC Sit to Stand: Contact guard assist   Step pivot transfers: Contact guard assist       General transfer comment: pt needing increased time to come to EOB, CGA for line managment/saftey    Ambulation/Gait Ambulation/Gait assistance: Supervision Gait Distance (Feet): 165 Feet Assistive device: Rolling walker (2 wheels) Gait Pattern/deviations: WFL(Within Functional Limits) Gait velocity: decr         Stairs             Wheelchair Mobility     Tilt Bed    Modified Rankin (Stroke Patients Only)       Balance                                            Communication Communication Communication: No apparent difficulties  Cognition Arousal: Alert Behavior During Therapy: WFL for tasks assessed/performed   PT - Cognitive impairments: No apparent impairments                         Following commands: Intact      Cueing Cueing Techniques: Verbal cues, Gestural cues  Exercises      General Comments General comments (skin integrity, edema, etc.): on 2L O2 throughout, SPO2 desaturating to ~78% on inital stand but >88% with seated break and throughlout activity, when good pleth, no c/o of SOB during activity, and SPO2 at 95% at end of session      Pertinent Vitals/Pain Pain Assessment Pain Assessment: Faces Faces Pain  Scale: Hurts a little bit Pain Location: chest tube site Pain Descriptors / Indicators: Discomfort, Grimacing, Sore Pain Intervention(s): Monitored during session, Limited activity within patient's tolerance    Home Living                          Prior Function            PT Goals (current goals can now be found in the care plan section) Acute Rehab PT Goals Patient Stated Goal: return home alone PT Goal Formulation: With patient Time For Goal Achievement: 02/21/24 Progress towards PT goals: Progressing toward goals     Frequency    Min 3X/week      PT Plan      Co-evaluation              AM-PAC PT "6 Clicks" Mobility   Outcome Measure  Help needed turning from your back to your side while in a flat bed without using bedrails?: None Help needed moving from lying on your back to sitting on the side of a flat bed without using bedrails?: A Little Help needed moving to and from a bed to a chair (including a wheelchair)?: A Little Help needed standing up from a chair using your arms (e.g., wheelchair or bedside chair)?: A Little Help needed to walk in hospital room?: A Little Help needed climbing 3-5 steps with a railing? : A Lot 6 Click Score: 18    End of Session Equipment Utilized During Treatment: Oxygen  Activity Tolerance: Patient tolerated treatment well Patient left: in chair;with call bell/phone within reach Nurse Communication: Mobility status (O2 sats with mobility) PT Visit Diagnosis: Other abnormalities of gait and mobility (R26.89)     Time: 1610-9604 PT Time Calculation (min) (ACUTE ONLY): 29 min  Charges:    $Gait Training: 8-22 mins $Therapeutic Activity: 8-22 mins PT General Charges $$ ACUTE PT VISIT: 1 Visit                     Forrest Iha 02/08/2024, 1:47 PM

## 2024-02-08 NOTE — Progress Notes (Addendum)
 2 Days Post-Op Procedure(s) (LRB): WEDGE RESECTION, LUNG, ROBOT-ASSISTED, THORACOSCOPIC, BLEB RESECTION (Left) Subjective: Michael Ellison is awake, alert and oriented laying in bed. He reports some episodes of SOB yesterday which has since resolved. He is on 3L which he says he is tolerating well. He has report no pain at this time. He has passed gassed but BM yet. His chest output this morning is about    Objective: Vital signs in last 24 hours: Temp:  [98.5 F (36.9 C)-99.5 F (37.5 C)] 98.9 F (37.2 C) (04/23 0253) Pulse Rate:  [76-95] 85 (04/23 0253) Cardiac Rhythm: Normal sinus rhythm (04/23 0700) Resp:  [15-27] 24 (04/23 0253) BP: (98-138)/(55-90) 130/78 (04/23 0253) SpO2:  [91 %-95 %] 95 % (04/23 0253)  Hemodynamic parameters for last 24 hours:    Intake/Output from previous day: 04/22 0701 - 04/23 0700 In: 480 [P.O.:480] Out: 1860 [Urine:1450; Chest Tube:410] Intake/Output this shift: No intake/output data recorded.  General appearance: alert, cooperative, and no distress Neurologic: intact Heart: regular rate and rhythm Lungs: diminished breath sounds bilaterally Abdomen: soft, non-tender; bowel sounds normal; no masses,  no organomegaly Extremities: extremities normal, atraumatic, no cyanosis or edema Wound: no erythema or signs of infection  Lab Results: Recent Labs    02/05/24 1525 02/06/24 1058 02/07/24 0301  WBC 13.9*  --  12.5*  HGB 15.3 13.3 12.6*  HCT 47.5 39.0 39.5  PLT 215  --  183   BMET:  Recent Labs    02/05/24 1525 02/06/24 1058 02/07/24 0301  NA 138 139 133*  K 4.2 4.8 5.1  CL 105  --  103  CO2 22  --  25  GLUCOSE 104*  --  121*  BUN 29*  --  29*  CREATININE 1.25*  --  1.32*  CALCIUM 9.1  --  8.1*    PT/INR:  Recent Labs    02/05/24 1525  LABPROT 15.1  INR 1.2   ABG    Component Value Date/Time   PHART 7.298 (L) 02/06/2024 1058   HCO3 24.6 02/06/2024 1058   TCO2 26 02/06/2024 1058   ACIDBASEDEF 2.0 02/06/2024 1058    O2SAT 92 02/06/2024 1058   CBG (last 3)  No results for input(s): "GLUCAP" in the last 72 hours.  Assessment/Plan: S/P Procedure(s) (LRB): WEDGE RESECTION, LUNG, ROBOT-ASSISTED, THORACOSCOPIC, BLEB RESECTION (Left)   Cardio: NSR Pulm: On 3L [place] tolerating well. titrate to keep SpO2 > 90. Chest tube output this morning about . now on water  seal.  GI: distended abd, has not improved or gotten worse. He has no tendersness to palpation. He reports passing gas but no bm. Continue stool softeners as needed GU: hx of BPH. On flomax . No BMET labs today.  DVT: on SQ heparin . Ambulate if possible with PT assistance Dispo: chest tube to water  seal drainage too high to remove. Repeat CXR in the am.         LOS: 4 days    Samir Halalou 02/08/2024 Patient seen and examined, agree with above CXR OK, no air leak but a little too much fluid to pull tube Will place to water  seal Path- benign blebs Deconditioning- severe, mobilize  Landon Pinion C. Luna Salinas, MD [facility] [phone]

## 2024-02-08 NOTE — Plan of Care (Signed)
  Problem: Education: Goal: Knowledge of General Education information will improve Description: Including pain rating scale, medication(s)/side effects and non-pharmacologic comfort measures Outcome: Progressing   Problem: Health Behavior/Discharge Planning: Goal: Ability to manage health-related needs will improve Outcome: Progressing   Problem: Clinical Measurements: Goal: Ability to maintain clinical measurements within normal limits will improve Outcome: Progressing Goal: Will remain free from infection Outcome: Progressing Goal: Diagnostic test results will improve Outcome: Progressing Goal: Respiratory complications will improve Outcome: Progressing Goal: Cardiovascular complication will be avoided Outcome: Progressing   Problem: Activity: Goal: Risk for activity intolerance will decrease Outcome: Progressing   Problem: Nutrition: Goal: Adequate nutrition will be maintained Outcome: Progressing   Problem: Coping: Goal: Level of anxiety will decrease Outcome: Progressing   Problem: Elimination: Goal: Will not experience complications related to bowel motility Outcome: Progressing Goal: Will not experience complications related to urinary retention Outcome: Progressing   Problem: Pain Managment: Goal: General experience of comfort will improve and/or be controlled Outcome: Progressing   Problem: Safety: Goal: Ability to remain free from injury will improve Outcome: Progressing   Problem: Skin Integrity: Goal: Risk for impaired skin integrity will decrease Outcome: Progressing   Problem: Cardiac: Goal: Will achieve and/or maintain hemodynamic stability Outcome: Progressing   Problem: Respiratory: Goal: Respiratory status will improve Outcome: Progressing

## 2024-02-09 ENCOUNTER — Inpatient Hospital Stay (HOSPITAL_COMMUNITY)

## 2024-02-09 DIAGNOSIS — J9383 Other pneumothorax: Secondary | ICD-10-CM | POA: Diagnosis not present

## 2024-02-09 LAB — TYPE AND SCREEN
ABO/RH(D): A POS
Antibody Screen: NEGATIVE
Unit division: 0
Unit division: 0

## 2024-02-09 LAB — BASIC METABOLIC PANEL WITH GFR
Anion gap: 8 (ref 5–15)
BUN: 25 mg/dL — ABNORMAL HIGH (ref 8–23)
CO2: 23 mmol/L (ref 22–32)
Calcium: 8.4 mg/dL — ABNORMAL LOW (ref 8.9–10.3)
Chloride: 105 mmol/L (ref 98–111)
Creatinine, Ser: 0.95 mg/dL (ref 0.61–1.24)
GFR, Estimated: >60 mL/min (ref >60)
Glucose, Bld: 96 mg/dL (ref 70–99)
Potassium: 4.4 mmol/L (ref 3.5–5.1)
Sodium: 136 mmol/L (ref 135–145)

## 2024-02-09 LAB — BPAM RBC
Blood Product Expiration Date: 202505192359
Blood Product Expiration Date: 202505192359
Unit Type and Rh: 6200
Unit Type and Rh: 6200

## 2024-02-09 LAB — CBC
HCT: 36.2 % — ABNORMAL LOW (ref 39.0–52.0)
Hemoglobin: 11.7 g/dL — ABNORMAL LOW (ref 13.0–17.0)
MCH: 26.7 pg (ref 26.0–34.0)
MCHC: 32.3 g/dL (ref 30.0–36.0)
MCV: 82.6 fL (ref 80.0–100.0)
Platelets: 189 10*3/uL (ref 150–400)
RBC: 4.38 MIL/uL (ref 4.22–5.81)
RDW: 15.9 % — ABNORMAL HIGH (ref 11.5–15.5)
WBC: 13.7 10*3/uL — ABNORMAL HIGH (ref 4.0–10.5)
nRBC: 0 % (ref 0.0–0.2)

## 2024-02-09 LAB — PROCALCITONIN: Procalcitonin: 0.26 ng/mL

## 2024-02-09 MED ORDER — BISACODYL 10 MG RE SUPP
10.0000 mg | Freq: Once | RECTAL | Status: AC
  Administered 2024-02-09: 10 mg via RECTAL
  Filled 2024-02-09: qty 1

## 2024-02-09 NOTE — Plan of Care (Signed)
  Problem: Education: Goal: Knowledge of General Education information will improve Description: Including pain rating scale, medication(s)/side effects and non-pharmacologic comfort measures Outcome: Progressing   Problem: Health Behavior/Discharge Planning: Goal: Ability to manage health-related needs will improve Outcome: Progressing   Problem: Clinical Measurements: Goal: Ability to maintain clinical measurements within normal limits will improve Outcome: Progressing Goal: Will remain free from infection Outcome: Progressing Goal: Diagnostic test results will improve Outcome: Progressing Goal: Respiratory complications will improve Outcome: Progressing Goal: Cardiovascular complication will be avoided Outcome: Progressing   Problem: Activity: Goal: Risk for activity intolerance will decrease Outcome: Progressing   Problem: Nutrition: Goal: Adequate nutrition will be maintained Outcome: Progressing   Problem: Coping: Goal: Level of anxiety will decrease Outcome: Progressing   Problem: Elimination: Goal: Will not experience complications related to bowel motility Outcome: Progressing Goal: Will not experience complications related to urinary retention Outcome: Progressing   Problem: Pain Managment: Goal: General experience of comfort will improve and/or be controlled Outcome: Progressing   Problem: Safety: Goal: Ability to remain free from injury will improve Outcome: Progressing   Problem: Skin Integrity: Goal: Risk for impaired skin integrity will decrease Outcome: Progressing   Problem: Cardiac: Goal: Will achieve and/or maintain hemodynamic stability Outcome: Progressing   Problem: Clinical Measurements: Goal: Postoperative complications will be avoided or minimized Outcome: Progressing   Problem: Respiratory: Goal: Respiratory status will improve Outcome: Progressing

## 2024-02-09 NOTE — Progress Notes (Signed)
 Physical Therapy Treatment Patient Details Name: Michael Ellison MRN: [id number] DOB: 1946/04/02 Today's Date: 02/09/2024   History of Present Illness 78 y.o. male presents to the ED 02/04/24 with complaints of dyspnea and left-sided chest discomfort since 4/18. chest x-ray with 50% left-sided pneumothorax; L chest tube placed; 4/21 VATS with wedge resection RUL: 4/24 chest tube removed  PMH significant for COPD with chronic hypoxic respiratory failure using oxygen  at 2 to 3 L as needed, HTN, GERD , PSVT    PT Comments  Patient resting in bed on arrival. Happy to get up and mobilize. Progressed to ambulation without a device with pt demonstrating good balance despite slower cadence due to respiratory status. See previous note re: oxygen  saturation and O2 needs. Continue to feel pt can benefit from PT, including HHPT on discharge.     If plan is discharge home, recommend the following: A little help with walking and/or transfers;Assistance with cooking/housework;Assist for transportation   Can travel by private vehicle        Equipment Recommendations  None recommended by PT    Recommendations for Other Services       Precautions / Restrictions Precautions Precautions: Fall Recall of Precautions/Restrictions: Intact Restrictions Weight Bearing Restrictions Per Provider Order: No     Mobility  Bed Mobility Overal bed mobility: Modified Independent Bed Mobility: Supine to Sit   Sidelying to sit: Modified independent (Device/Increase time)       General bed mobility comments: incr time and effort; pt managed linens himself    Transfers Overall transfer level: Needs assistance Equipment used: None Transfers: Sit to/from Stand Sit to Stand: Contact guard assist           General transfer comment: no imbalance; CGA for safety    Ambulation/Gait Ambulation/Gait assistance: Contact guard assist Gait Distance (Feet): 200 Feet Assistive device: None Gait  Pattern/deviations: WFL(Within Functional Limits) Gait velocity: decr Gait velocity interpretation: 1.31 - 2.62 ft/sec, indicative of limited community ambulator   General Gait Details: slow cadence due to respiratory status; maintained balance and straight path   Stairs             Wheelchair Mobility     Tilt Bed    Modified Rankin (Stroke Patients Only)       Balance Overall balance assessment: Needs assistance Sitting-balance support: No upper extremity supported, Feet supported Sitting balance-Leahy Scale: Good     Standing balance support: Bilateral upper extremity supported, During functional activity Standing balance-Leahy Scale: Good                              Communication Communication Communication: No apparent difficulties  Cognition Arousal: Alert Behavior During Therapy: WFL for tasks assessed/performed, Anxious   PT - Cognitive impairments: No apparent impairments                         Following commands: Intact      Cueing Cueing Techniques: Verbal cues, Gestural cues  Exercises   Reinforced need to use IS. Pt able to verbalize he should use it during every TV commercial break.     General Comments General comments (skin integrity, edema, etc.): see separate ambulatory  oxygen  saturation note; maintained on 2L      Pertinent Vitals/Pain Pain Assessment Pain Assessment: Faces Faces Pain Scale: Hurts a little bit Pain Location: chest tube site Pain Descriptors / Indicators: Discomfort, Sore Pain Intervention(s): Limited activity  within patient's tolerance, Monitored during session    Home Living                          Prior Function            PT Goals (current goals can now be found in the care plan section) Acute Rehab PT Goals Patient Stated Goal: return home alone Time For Goal Achievement: 02/21/24 Potential to Achieve Goals: Good Progress towards PT goals: Progressing toward goals     Frequency    Min 3X/week      PT Plan      Co-evaluation              AM-PAC PT "6 Clicks" Mobility   Outcome Measure  Help needed turning from your back to your side while in a flat bed without using bedrails?: None Help needed moving from lying on your back to sitting on the side of a flat bed without using bedrails?: None Help needed moving to and from a bed to a chair (including a wheelchair)?: A Little Help needed standing up from a chair using your arms (e.g., wheelchair or bedside chair)?: A Little Help needed to walk in hospital room?: A Little Help needed climbing 3-5 steps with a railing? : A Little 6 Click Score: 20    End of Session Equipment Utilized During Treatment: Oxygen ;Gait belt Activity Tolerance: Patient tolerated treatment well;Patient limited by fatigue Patient left: in chair;with call bell/phone within reach Nurse Communication: Mobility status (O2 sats with mobility) PT Visit Diagnosis: Other abnormalities of gait and mobility (R26.89)     Time: 1914-7829 PT Time Calculation (min) (ACUTE ONLY): 16 min  Charges:    $Gait Training: 8-22 mins PT General Charges $$ ACUTE PT VISIT: 1 Visit                      Gayle Kava, PT Acute Rehabilitation Services  Office [phone]    Guilford Leep 02/09/2024, 1:27 PM

## 2024-02-09 NOTE — Progress Notes (Signed)
 SATURATION QUALIFICATIONS: (This note is used to comply with regulatory documentation for home oxygen )  Patient Saturations on Room Air at Rest = 85%  Patient Saturations on Room Air while Ambulating = NT as desaturates at rest  Patient Saturations on 2 Liters of oxygen  while Ambulating = 88%  Please briefly explain why patient needs home oxygen :  To maintain oxygen  saturation >87% during functional activities.    Gayle Kava, PT Acute Rehabilitation Services  Office [phone]

## 2024-02-09 NOTE — Progress Notes (Signed)
 Occupational Therapy Treatment Patient Details Name: Michael Ellison MRN: [id number] DOB: 08-Oct-1946 Today's Date: 02/09/2024   History of present illness 78 y.o. male presents to the ED 02/04/24 with complaints of dyspnea and left-sided chest discomfort since 4/18. chest x-ray with 50% left-sided pneumothorax; L chest tube placed; 4/21 VATS with wedge resection RUL  PMH significant for COPD with chronic hypoxic respiratory failure using oxygen  at 2 to 3 L as needed, HTN, GERD , PSVT   OT comments  OT session focused on education in energy conservation strategies (with handouts provided) for increased safety and independence with functional tasks and improved overall management of chronic health conditions. Pt verbalized and demonstrated understanding of all education through teach back. Pt VSS on 2L continuous O2 through nasal cannula throughout session. Pt is making progress toward goals and will benefit from continued acute skilled OT services. Post acute discharge, pt will benefit from [facility] OT to maximize rehab potential and decrease risk of rehospitalization.       If plan is discharge home, recommend the following:  A little help with walking and/or transfers;A little help with bathing/dressing/bathroom;Assistance with cooking/housework;Assist for transportation   Equipment Recommendations  None recommended by OT (Pt already has needed equipment.)    Recommendations for Other Services      Precautions / Restrictions Precautions Precautions: Fall Recall of Precautions/Restrictions: Intact Precaution/Restrictions Comments: L Chest Tube (scheduled to be removed 4/24) Restrictions Weight Bearing Restrictions Per Provider Order: No       Mobility Bed Mobility               General bed mobility comments: Pt declined mobility this session due to pt awaiting removal of chest tube this morning. Pt requesting assistance getting up to recliner after chest tube is pulled with OT  encouraging pt to get up to chair as appropriate after procedure and letting pt know that nursing staff can also assist him when he is ready.    Transfers                         Balance                                           ADL either performed or assessed with clinical judgement   ADL Overall ADL's : Needs assistance/impaired                                       General ADL Comments: OT educated pt in and provided handouts related to energy conservation strategies to increase safety and independence with functional tasks and improve overall management of chronic health conditions. Pt verbalized and demonstrated understanding of all education through teach back and reports he things energy conservations will be helpful when he returns home. Pt also reports family will be assisting him upon discharge. OT and pt discussed finding a balance between staying active in daily activities/hobbies to continue to recover while maintaining overall strength, flexibility, etc., while also asking for assistance for tasks that may be too demanding just after discharge, such as yard work or cooking large meals. Pt verbalizes agreement and demonstrates understanding through teach back.    Extremity/Trunk Assessment Upper Extremity Assessment Upper Extremity Assessment: Overall WFL for tasks assessed   Lower Extremity Assessment  Lower Extremity Assessment: Defer to PT evaluation        Vision   Vision Assessment?: Wears glasses for reading Additional Comments: at baseline   Perception     Praxis     Communication Communication Communication: No apparent difficulties   Cognition Arousal: Alert Behavior During Therapy: WFL for tasks assessed/performed, Anxious Cognition: No apparent impairments             OT - Cognition Comments: Pt AAOx4 and pleasant throughout session. Pt demonstrates good safety awareness and good insight. Pt with mild  signs of anxiety when discussing removal of chest tube planned for this morning with pt declining mobility this session due to awaiting chest tube removal. OT offered therapeutic listening. Pt participated well from bed level during session. and requests to get up to chair following removal of chest tube.                 Following commands: Intact        Cueing      Exercises      Shoulder Instructions       General Comments VSS on 2L continuous O2 through nasal cannula throughout session.    Pertinent Vitals/ Pain       Pain Assessment Pain Assessment: No/denies pain Pain Intervention(s): Monitored during session  Home Living                                          Prior Functioning/Environment              Frequency  Min 2X/week        Progress Toward Goals  OT Goals(current goals can now be found in the care plan section)  Progress towards OT goals: Progressing toward goals  Acute Rehab OT Goals Patient Stated Goal: to feel better and return to being fully independent  Plan      Co-evaluation                 AM-PAC OT "6 Clicks" Daily Activity     Outcome Measure   Help from another person eating meals?: None Help from another person taking care of personal grooming?: A Little Help from another person toileting, which includes using toliet, bedpan, or urinal?: A Little Help from another person bathing (including washing, rinsing, drying)?: A Little Help from another person to put on and taking off regular upper body clothing?: A Little Help from another person to put on and taking off regular lower body clothing?: A Little 6 Click Score: 19    End of Session    OT Visit Diagnosis: Other (comment) (decreased activity tolerance)   Activity Tolerance Patient tolerated treatment well   Patient Left in bed;with call bell/phone within reach   Nurse Communication Mobility status        Time: 1610-9604 OT Time  Calculation (min): 18 min  Charges: OT General Charges $OT Visit: 1 Visit OT Treatments $Self Care/Home Management : 8-22 mins  Azrael Maddix "Darral Ellis., OTR/L, MA Acute Rehab [phone]   Walt Gunner 02/09/2024, 10:07 AM

## 2024-02-09 NOTE — Plan of Care (Signed)
  Problem: Education: Goal: Knowledge of General Education information will improve Description: Including pain rating scale, medication(s)/side effects and non-pharmacologic comfort measures Outcome: Progressing   Problem: Health Behavior/Discharge Planning: Goal: Ability to manage health-related needs will improve Outcome: Progressing   Problem: Clinical Measurements: Goal: Ability to maintain clinical measurements within normal limits will improve Outcome: Progressing Goal: Will remain free from infection Outcome: Progressing Goal: Diagnostic test results will improve Outcome: Progressing Goal: Respiratory complications will improve Outcome: Progressing   Problem: Pain Managment: Goal: General experience of comfort will improve and/or be controlled Outcome: Progressing   Problem: Safety: Goal: Ability to remain free from injury will improve Outcome: Progressing   Problem: Skin Integrity: Goal: Risk for impaired skin integrity will decrease Outcome: Progressing   Problem: Education: Goal: Knowledge of disease or condition will improve Outcome: Progressing   Problem: Clinical Measurements: Goal: Postoperative complications will be avoided or minimized Outcome: Progressing   Problem: Respiratory: Goal: Respiratory status will improve Outcome: Progressing   Problem: Skin Integrity: Goal: Wound healing without signs and symptoms infection will improve Outcome: Progressing

## 2024-02-09 NOTE — TOC Progression Note (Signed)
 Transition of Care [facility]) - Progression Note    Patient Details  Name: Michael Ellison MRN: [id number] Date of Birth: 07-12-1946  [facility] CM/SW Contact  Jeani Mill, RN Phone Number: 02/09/2024, 2:18 PM  Clinical Narrative:    Patient is agreeable to home health.  This RNCM offered choice for Home Health, Patient states he has no preference, RNCM made referral to Amy with [facility], She is able to take referral.  Patient has home 02 as needed. Patient is unsure what agency provides home 02.  Patient has all needed DME. Address, Phone number and PCP verified.  Expected Discharge Plan: Home/Self Care Barriers to Discharge: Continued Medical Work up  Expected Discharge Plan and Services       Living arrangements for the past 2 months: Single Family Home                             HH Agency: [facility] Date [facility] Agency Contacted: 02/09/24 Time HH Agency Contacted: 1418 Representative spoke with at [facility] Agency: Amy   Social Determinants of Health (SDOH) Interventions SDOH Screenings   Food Insecurity: No Food Insecurity (02/04/2024)  Housing: Low Risk  (02/04/2024)  Transportation Needs: No Transportation Needs (02/04/2024)  Utilities: Not At Risk (02/04/2024)  Depression (PHQ2-9): Low Risk  (12/14/2018)  Social Connections: Moderately Integrated (02/04/2024)  Tobacco Use: Medium Risk (02/06/2024)    Readmission Risk Interventions     No data to display

## 2024-02-09 NOTE — Progress Notes (Signed)
 3 Days Post-Op Procedure(s) (LRB): WEDGE RESECTION, LUNG, ROBOT-ASSISTED, THORACOSCOPIC, BLEB RESECTION (Left) Subjective: [name] is awake, alert and oriented sitting in a recliner eating breakfast. He says he has no complains today and feels like he is progressing well. He says he ambulated yesterday with PT and had minimal discomfort whiles doing so. Still no BM but is passing gas.  Objective: Vital signs in last 24 hours: Temp:  [98.4 F (36.9 C)-98.9 F (37.2 C)] 98.5 F (36.9 C) (04/24 0714) Pulse Rate:  [80-95] 95 (04/24 0737) Cardiac Rhythm: Normal sinus rhythm (04/24 0700) Resp:  [17-21] 20 (04/24 0737) BP: (108-145)/(55-83) 145/83 (04/24 0714) SpO2:  [90 %-99 %] 93 % (04/24 0737)  Hemodynamic parameters for last 24 hours:    Intake/Output from previous day: 04/23 0701 - 04/24 0700 In: 620 [P.O.:620] Out: 1685 [Urine:1575; Chest Tube:110] Intake/Output this shift: No intake/output data recorded.  General appearance: alert, cooperative, and no distress Neurologic: intact Heart: regular rate and rhythm Lungs: rhonchi bilaterally Abdomen: soft non tender, distended bowel  Extremities: extremities normal, atraumatic, no cyanosis or edema Wound: no erythema or infection  Lab Results: Recent Labs    02/08/24 0808 02/09/24 0320  WBC 14.1* 13.7*  HGB 12.8* 11.7*  HCT 39.7 36.2*  PLT 204 189   BMET:  Recent Labs    02/08/24 0808 02/09/24 0320  NA 133* 136  K 4.3 4.4  CL 99 105  CO2 26 23  GLUCOSE 90 96  BUN 26* 25*  CREATININE 1.11 0.95  CALCIUM 8.4* 8.4*    PT/INR: No results for input(s): "LABPROT", "INR" in the last 72 hours. ABG    Component Value Date/Time   PHART 7.298 (L) 02/06/2024 1058   HCO3 24.6 02/06/2024 1058   TCO2 26 02/06/2024 1058   ACIDBASEDEF 2.0 02/06/2024 1058   O2SAT 92 02/06/2024 1058   CBG (last 3)  No results for input(s): "GLUCAP" in the last 72 hours.  Assessment/Plan: S/P Procedure(s) (LRB): WEDGE RESECTION,  LUNG, ROBOT-ASSISTED, THORACOSCOPIC, BLEB RESECTION (Left)   Cardio: NSR Pulm: CXR today shows no signs of PTX. Chest tube output today about , on water  seal with no signs of leak. Possible chest tube removal today GI: - BM, but passing gas. Has a good appetite. His abdomen is distended but has no guarding or rebound tenderness. Continue stool softeners as needed Gu: hx of BPH, on flomax   DVT: On SQ heparin , was ambulated yesterday by PT. Dispo: chest tube on water  seal. Possible removal today.     LOS: 5 days    [facility] 02/09/2024

## 2024-02-09 NOTE — Progress Notes (Signed)
 PROGRESS NOTE    UNIQUE SEARFOSS  GNF:[id number] DOB: May 06, 1946 DOA: 02/04/2024 PCP: Wyvonna Heidelberg, MD    Brief Narrative:   Michael Ellison is a 78 y.o. male with past medical history significant for COPD/emphysema with bullae, chronic hypoxic respite failure on 2-3 L nasal cannula at baseline, HTN, PSVT, GERD, reformed tobacco abuse disorder who presented as a transfer from [facility] ED with acute left-sided tension pneumothorax.  Chest tube was placed at [facility] and transferred to [facility] for further evaluation and management by cardiothoracic surgery.  Patient underwent robotic assisted right video-assisted thoracoscopy with resection of multiple blebs and lysis of adhesions by [name] on 02/06/2024.  Assessment & Plan:   Acute on chronic respiratory failure Left-sided tension pneumothorax, acute History of COPD/emphysema with bullae Patient initially presenting to the ED with acute shortness of breath.  Imaging notable for acute tension pneumothorax and chest tube was placed.  Patient was transferred to [facility] for evaluation under the cardiothoracic surgery service.  Patient underwent robotic assisted right video-assisted thorascopic with resection of multiple blebs and lysis of adhesions by [name] on 02/06/2024. Pathology from bleb resection; emphysematous bleb, negative for malignancy -- Continue supplemental oxygen , maintain SpO2 greater than 88%, baseline 2-3L PRN -- Continues with chest tube to water  seal; possible removal by CTS today -- Monitor chest tube output, 110 mL past 24 hours -- CXR in the am  COPD/emphysema -- Bretztri 2 puffs twice daily (on Symbicort  outpatient) -- DuoNeb 3 times daily -- Albuterol  neb every 2 hours PRN wheezing/shortness of breath  Essential hypertension History of PSVT -- Irbesartan  75 mg p.o. daily -- Bisoprolol  2 point milligrams p.o. daily  BPH -- Tamsulosin  0.4 g  p.o. daily  Constipation -- Senokot-S1 tablet p.o. twice daily -- Lubiprostone  24 mcg PO daily -- MiraLAX  daily as needed mild constipation -- Dulcolax suppository daily/PRN moderate constipation   DVT prophylaxis: SCD's Start: 02/06/24 1357 heparin  injection 5,000 Units Start: 02/04/24 1400 SCDs Start: 02/04/24 1007 Place TED hose Start: 02/04/24 1007    Code Status: Full Code Family Communication: No family present at bedside this morning  Disposition Plan:  Level of care: Progressive Status is: Inpatient Remains inpatient appropriate because: Remains with chest tube in place    Consultants:  PCCM CTS  Procedures:  VATS w/ bleb resection, lysis of adhesions 4/21, CTS; [name]  Antimicrobials:  Azithromycin  4/19>> Cefazolin  4/21 - 4/21   Subjective: Patient seen examined bedside, sitting in bedside chair.  Eating breakfast.  No complaints this morning.  Chest tube remains in place, output decreasing.  CTS plans likely chest tube removal today. No other specific questions, concerns or complaints at this time.  Denies headache, no dizziness, no chest pain, no palpitations, no current shortness of breath, no fever/chills/night sweats, no nausea/vomiting/diarrhea, no focal weakness, no fatigue, no paresthesias.  No acute events overnight per nursing.  Objective: Vitals:   02/09/24 0345 02/09/24 0714 02/09/24 0737 02/09/24 1138  BP: (!) 140/70 (!) 145/83  (!) 104/59  Pulse: 83 95 95 97  Resp: 17 20 20 18   Temp: 98.4 F (36.9 C) 98.5 F (36.9 C)  98.2 F (36.8 C)  TempSrc: Oral Oral  Oral  SpO2: 94% 99% 93% 95%  Weight:      Height:        Intake/Output Summary (Last 24 hours) at 02/09/2024 1242 Last data filed at 02/09/2024 1206 Gross per 24 hour  Intake 960 ml  Output 1995 ml  Net -1035 ml   Filed Weights   02/04/24 0659  Weight: 95.3 kg    Examination:  Physical Exam: GEN: NAD, alert and oriented x 3, elderly in appearance HEENT: NCAT, PERRL,  EOMI, sclera clear, MMM PULM: Breath sound slight diminished breath bases, no wheezes/crackles, normal respiratory effort without accessory muscle use, 2 L nasal cannula with SPO2 99% at rest, chest tube noted to suction CV: RRR w/o M/G/R GI: abd soft, NTND, NABS, no R/G/M MSK: no peripheral edema, muscle strength globally intact 5/5 bilateral upper/lower extremities NEURO: CN II-XII intact, no focal deficits, sensation to light touch intact PSYCH: normal mood/affect Integumentary: dry/intact, no rashes or wounds    Data Reviewed: I have personally reviewed following labs and imaging studies  CBC: Recent Labs  Lab 02/04/24 0705 02/05/24 1525 02/06/24 1058 02/07/24 0301 02/08/24 0808 02/09/24 0320  WBC 9.6 13.9*  --  12.5* 14.1* 13.7*  NEUTROABS 6.5  --   --   --   --   --   HGB 16.3 15.3 13.3 12.6* 12.8* 11.7*  HCT 50.7 47.5 39.0 39.5 39.7 36.2*  MCV 83.4 82.6  --  82.8 82.7 82.6  PLT 227 215  --  183 204 189   Basic Metabolic Panel: Recent Labs  Lab 02/04/24 0705 02/05/24 1525 02/06/24 1058 02/07/24 0301 02/08/24 0808 02/09/24 0320  NA 138 138 139 133* 133* 136  K 4.4 4.2 4.8 5.1 4.3 4.4  CL 106 105  --  103 99 105  CO2 24 22  --  25 26 23   GLUCOSE 103* 104*  --  121* 90 96  BUN 15 29*  --  29* 26* 25*  CREATININE 1.02 1.25*  --  1.32* 1.11 0.95  CALCIUM 9.5 9.1  --  8.1* 8.4* 8.4*   GFR: Estimated Creatinine Clearance: 78 mL/min (by C-G formula based on SCr of 0.95 mg/dL). Liver Function Tests: Recent Labs  Lab 02/04/24 0705 02/05/24 1525 02/08/24 0808  AST 17 16 22   ALT 16 14 12   ALKPHOS 55 43 36*  BILITOT 0.6 0.9 1.4*  PROT 8.3* 6.9 6.0*  ALBUMIN  4.1 3.4* 2.9*   No results for input(s): "LIPASE", "AMYLASE" in the last 168 hours. No results for input(s): "AMMONIA" in the last 168 hours. Coagulation Profile: Recent Labs  Lab 02/05/24 1525  INR 1.2   Cardiac Enzymes: No results for input(s): "CKTOTAL", "CKMB", "CKMBINDEX", "TROPONINI" in the  last 168 hours. BNP (last 3 results) No results for input(s): "PROBNP" in the last 8760 hours. HbA1C: No results for input(s): "HGBA1C" in the last 72 hours. CBG: No results for input(s): "GLUCAP" in the last 168 hours. Lipid Profile: No results for input(s): "CHOL", "HDL", "LDLCALC", "TRIG", "CHOLHDL", "LDLDIRECT" in the last 72 hours. Thyroid  Function Tests: No results for input(s): "TSH", "T4TOTAL", "FREET4", "T3FREE", "THYROIDAB" in the last 72 hours. Anemia Panel: No results for input(s): "VITAMINB12", "FOLATE", "FERRITIN", "TIBC", "IRON", "RETICCTPCT" in the last 72 hours. Sepsis Labs: Recent Labs  Lab 02/09/24 0320  PROCALCITON 0.26    Recent Results (from the past 240 hours)  Resp panel by RT-PCR (RSV, Flu A&B, Covid) Anterior Nasal Swab     Status: None   Collection Time: 02/04/24  7:30 AM   Specimen: Anterior Nasal Swab  Result Value Ref Range Status   SARS Coronavirus 2 by RT PCR NEGATIVE NEGATIVE Final    Comment: (NOTE) SARS-CoV-2 target nucleic acids are NOT DETECTED.  The SARS-CoV-2 RNA is generally detectable in  upper respiratory specimens during the acute phase of infection. The lowest concentration of SARS-CoV-2 viral copies this assay can detect is 138 copies/mL. A negative result does not preclude SARS-Cov-2 infection and should not be used as the sole basis for treatment or other patient management decisions. A negative result may occur with  improper specimen collection/handling, submission of specimen other than nasopharyngeal swab, presence of viral mutation(s) within the areas targeted by this assay, and inadequate number of viral copies(<138 copies/mL). A negative result must be combined with clinical observations, patient history, and epidemiological information. The expected result is Negative.  Fact Sheet for Patients:  BloggerCourse.com  Fact Sheet for Healthcare Providers:   SeriousBroker.it  This test is no t yet approved or cleared by the [place]  FDA and  has been authorized for detection and/or diagnosis of SARS-CoV-2 by FDA under an Emergency Use Authorization (EUA). This EUA will remain  in effect (meaning this test can be used) for the duration of the COVID-19 declaration under Section 564(b)(1) of the Act, 21 U.S.C.section 360bbb-3(b)(1), unless the authorization is terminated  or revoked sooner.       Influenza A by PCR NEGATIVE NEGATIVE Final   Influenza B by PCR NEGATIVE NEGATIVE Final    Comment: (NOTE) The Xpert Xpress SARS-CoV-2/FLU/RSV plus assay is intended as an aid in the diagnosis of influenza from Nasopharyngeal swab specimens and should not be used as a sole basis for treatment. Nasal washings and aspirates are unacceptable for Xpert Xpress SARS-CoV-2/FLU/RSV testing.  Fact Sheet for Patients: BloggerCourse.com  Fact Sheet for Healthcare Providers: SeriousBroker.it  This test is not yet approved or cleared by the [place]  FDA and has been authorized for detection and/or diagnosis of SARS-CoV-2 by FDA under an Emergency Use Authorization (EUA). This EUA will remain in effect (meaning this test can be used) for the duration of the COVID-19 declaration under Section 564(b)(1) of the Act, 21 U.S.C. section 360bbb-3(b)(1), unless the authorization is terminated or revoked.     Resp Syncytial Virus by PCR NEGATIVE NEGATIVE Final    Comment: (NOTE) Fact Sheet for Patients: BloggerCourse.com  Fact Sheet for Healthcare Providers: SeriousBroker.it  This test is not yet approved or cleared by the [place]  FDA and has been authorized for detection and/or diagnosis of SARS-CoV-2 by FDA under an Emergency Use Authorization (EUA). This EUA will remain in effect (meaning this test can be used) for  the duration of the COVID-19 declaration under Section 564(b)(1) of the Act, 21 U.S.C. section 360bbb-3(b)(1), unless the authorization is terminated or revoked.  Performed at [facility], [address], [place], [place] [address]   MRSA Next Gen by PCR, Nasal     Status: Abnormal   Collection Time: 02/04/24  2:02 PM   Specimen: Nasal Mucosa; Nasal Swab  Result Value Ref Range Status   MRSA by PCR Next Gen DETECTED (A) NOT DETECTED Final    Comment: CRITICAL RESULT CALLED TO, READ BACK BY AND VERIFIED WITH: RN MONICA SIXTOS [id number] BY J RAZZAK, MT (NOTE) The GeneXpert MRSA Assay (FDA approved for NASAL specimens only), is one component of a comprehensive MRSA colonization surveillance program. It is not intended to diagnose MRSA infection nor to guide or monitor treatment for MRSA infections. Test performance is not FDA approved in patients less than 4 years old. Performed at [facility], [address], [place], [place] [address]          Radiology Studies: DG CHEST PORT 1 VIEW Result  Date: 02/09/2024 CLINICAL DATA:  Status post left lung surgery EXAM: PORTABLE CHEST 1 VIEW COMPARISON:  Film from the previous day. FINDINGS: Cardiac shadow is stable. Postsurgical changes are again noted on the left. Chest tube is again seen. Mild apical capping is noted on the left as well. No definitive pneumothorax is seen. Patchy airspace opacity is noted in the left lung base as well as right lung base stable from the prior exam. Central vascular congestion is again noted and stable. IMPRESSION: Chest tube without evidence of pneumothorax. Small left pleural effusion remains. Bibasilar airspace opacities left greater than right. Electronically Signed   By: Violeta Grey M.D.   On: 02/09/2024 09:25   DG CHEST PORT 1 VIEW Result Date: 02/08/2024 CLINICAL DATA:  Status post lung surgery, follow-up EXAM: PORTABLE CHEST 1 VIEW COMPARISON:  02/07/2024 FINDINGS: Postoperative changes on  the left. Left chest tube remains in place. No pneumothorax. Heart is mildly enlarged. Mild vascular congestion. Diffuse left lung airspace opacities and right base atelectasis. Small bilateral effusions. Left subcutaneous emphysema is unchanged. IMPRESSION: Postoperative changes on the left with stable position of left chest tube and no visible pneumothorax. Diffuse left lung airspace disease could reflect atelectasis or infiltrate. Right base opacity, likely atelectasis. Small bilateral effusions. Cardiomegaly, vascular congestion. Electronically Signed   By: Janeece Mechanic M.D.   On: 02/08/2024 09:47        Scheduled Meds:  aspirin  EC  81 mg Oral Q breakfast   azithromycin   500 mg Oral Daily   bisacodyl   10 mg Oral Daily   bisoprolol   2.5 mg Oral QPC supper   budeson-glycopyrrolate -formoterol   2 puff Inhalation BID   dextromethorphan -guaiFENesin   1 tablet Oral BID   gabapentin   300 mg Oral BID   heparin   5,000 Units Subcutaneous Q8H   ipratropium-albuterol   3 mL Nebulization BID   irbesartan   75 mg Oral Daily   lubiprostone   24 mcg Oral Q breakfast   mesalamine   4.8 g Oral Q breakfast   pantoprazole   40 mg Oral Daily   senna-docusate  1 tablet Oral BID   sodium chloride  flush  3 mL Intravenous Q12H   tamsulosin   0.4 mg Oral Daily   Continuous Infusions:   LOS: 5 days    Time spent: 48 minutes spent on 02/09/2024 caring for this patient face-to-face including chart review, ordering labs/tests, documenting, discussion with nursing staff, consultants, updating family and interview/physical exam    Rema Care [place], DO [facility] Available via Epic secure chat 7am-7pm After these hours, please refer to coverage provider listed on [email or web address] 02/09/2024, 12:42 PM

## 2024-02-10 ENCOUNTER — Inpatient Hospital Stay (HOSPITAL_COMMUNITY)

## 2024-02-10 DIAGNOSIS — J9383 Other pneumothorax: Secondary | ICD-10-CM | POA: Diagnosis not present

## 2024-02-10 NOTE — Discharge Summary (Signed)
 Physician Discharge Summary  Michael Ellison:[id number] DOB: Aug 20, 1946 DOA: 02/04/2024  PCP: Wyvonna Heidelberg, MD  Admit date: 02/04/2024 Discharge date: 02/10/2024  Admitted From: Home Disposition:  Home  Recommendations for Outpatient Follow-up:  Follow up with PCP in 1-2 weeks Outpatient follow-up with cardiothoracic surgery, [name] on 02/28/2024  Home Health: Oxygen  2 L [place] Equipment/Devices: none  Discharge Condition: Stable CODE STATUS: Full code Diet recommendation: Heart healthy diet  History of present illness:  Michael Ellison is a 78 y.o. male with past medical history significant for COPD/emphysema with bullae, chronic hypoxic respite failure on 2-3 L nasal cannula at baseline, HTN, PSVT, GERD, reformed tobacco abuse disorder who presented as a transfer from [facility] ED with acute left-sided tension pneumothorax.  Chest tube was placed at [facility] and transferred to [facility] for further evaluation and management by cardiothoracic surgery.  Patient underwent robotic assisted right video-assisted thoracoscopy with resection of multiple blebs and lysis of adhesions by [name] on 02/06/2024.   Hospital course:  Acute on chronic respiratory failure Left-sided tension pneumothorax, acute History of COPD/emphysema with bullae Patient initially presenting to the ED with acute shortness of breath.  Imaging notable for acute tension pneumothorax and chest tube was placed.  Patient was transferred to [facility] for evaluation under the cardiothoracic surgery service.  Patient underwent robotic assisted right video-assisted thorascopic with resection of multiple blebs and lysis of adhesions by [name] on 02/06/2024. Pathology from bleb resection; emphysematous bleb, negative for malignancy.  Chest tube was removed by cardiothoracic surgery on 02/09/2024.  Repeat chest x-ray stable.  Outpatient follow-up with  cardiothoracic surgery, [name] as scheduled on 02/28/2024.  COPD/emphysema Continue Symbicort , DuoNebs as needed.  Requiring 2 L nasal cannula with ambulation on discharge.   Essential hypertension History of PSVT Olmesartan  20 mg p.o. daily, bisoprolol  2.5 mg p.o. daily   BPH Tamsulosin  0.4 g p.o. daily    Discharge Diagnoses:  Principal Problem:   Spontaneous pneumothorax Active Problems:   Spontaneous tension pneumothorax   HTN (hypertension)   Paroxysmal SVT (supraventricular tachycardia) (HCC)   IBS (irritable colon syndrome)--C   COPD GOLD 2   Essential hypertension   Acute on chronic respiratory failure with hypoxia (HCC)   Chronic respiratory failure with hypoxia [facility])    Discharge Instructions  Discharge Instructions     Call MD for:  difficulty breathing, headache or visual disturbances   Complete by: As directed    Call MD for:  extreme fatigue   Complete by: As directed    Call MD for:  persistant dizziness or light-headedness   Complete by: As directed    Call MD for:  persistant nausea and vomiting   Complete by: As directed    Call MD for:  severe uncontrolled pain   Complete by: As directed    Call MD for:  temperature >100.4   Complete by: As directed    Diet - low sodium heart healthy   Complete by: As directed    Increase activity slowly   Complete by: As directed    No wound care   Complete by: As directed       Allergies as of 02/10/2024       Reactions   Lorazepam  Other (See Comments)   SEVERE DELERIUM AGITATION Tolerates midazolam  and other benzo's        Medication List     STOP taking these medications    aspirin  EC  81 MG tablet   GaviLyte-G 236 g solution Generic drug: polyethylene glycol   mesalamine  1.2 g EC tablet Commonly known as: LIALDA        TAKE these medications    acetaminophen  500 MG tablet Commonly known as: TYLENOL  Take 500 mg by mouth every 6 (six) hours as needed for headache.    bisoprolol  5 MG tablet Commonly known as: ZEBETA  Take 5 mg by mouth daily. What changed: Another medication with the same name was removed. Continue taking this medication, and follow the directions you see here.   furosemide  40 MG tablet Commonly known as: LASIX  Take 40 mg by mouth daily.   gabapentin  300 MG capsule Commonly known as: NEURONTIN  Take 300 mg by mouth daily as needed (for pain).   ipratropium-albuterol  0.5-2.5 (3) MG/3ML Soln Commonly known as: DUONEB Take 3 mLs by nebulization every 6 (six) hours as needed (for shortness of breath/wheezing).   lubiprostone  24 MCG capsule Commonly known as: Amitiza  Take one capsule once to twice daily for constipation.   meclizine 25 MG tablet Commonly known as: ANTIVERT Take 25 mg by mouth 3 (three) times daily.   meloxicam 7.5 MG tablet Commonly known as: MOBIC Take 7.5 mg by mouth 2 (two) times daily.   olmesartan  20 MG tablet Commonly known as: BENICAR  Take 1 tablet (20 mg total) by mouth daily.   Symbicort  160-4.5 MCG/ACT inhaler Generic drug: budesonide -formoterol  Inhale 1 puff into the lungs 2 (two) times daily.               Durable Medical Equipment  (From admission, onward)           Start     Ordered   02/10/24 1005  For home use only DME oxygen   Once       Question Answer Comment  Length of Need Lifetime   Mode or (Route) Nasal cannula   Liters per Minute 2   Frequency Continuous (stationary and portable oxygen  unit needed)   Oxygen  conserving device Yes   Oxygen  delivery system Gas      02/10/24 1004            Follow-up Information     [facility] Follow up on 02/28/2024.   Specialty: Cardiothoracic Surgery Why: Appointment with [name] at 12:45... Please get a CXR at 11:45 prior to your appointment iwth Dr. Lindon Rhine information: [address], [address] [place]   [id number] [phone]        [facility] Follow up.   Why: Home health has been arranged. they will contact you to schedule apt within 48hrs post discharge. Contact information: [address] [place] [address] [phone]         Wyvonna Heidelberg, MD. Schedule an appointment as soon as possible for a visit in 1 week(s).   Specialty: Internal Medicine Contact information: [address] [place] [address] [phone]                Allergies  Allergen Reactions   Lorazepam  Other (See Comments)    SEVERE DELERIUM AGITATION Tolerates midazolam  and other benzo's    Consultations: PCCM Cardiothoracic surgery   Procedures/Studies: DG Chest 2 View Result Date: 02/10/2024 CLINICAL DATA:  Left pneumothorax. EXAM: CHEST - 2 VIEW COMPARISON:  February 09, 2024. FINDINGS: Stable cardiomegaly. Stable small probably loculated left pleural effusion is noted.  Right lung is clear. Bony thorax is unremarkable. IMPRESSION: Stable small probably loculated left pleural effusion is noted with associated left basilar atelectasis or infiltrate. Electronically Signed   By: Rosalene Colon M.D.   On: 02/10/2024 09:43   DG Chest 1V REPEAT Same Day Result Date: 02/09/2024 CLINICAL DATA:  Chest tube removal EXAM: CHEST - 1 VIEW SAME DAY COMPARISON:  02/09/2024, 02/08/2024, CT 02/04/2024 FINDINGS: Emphysema. Removal of previously noted left-sided chest tube. No radiographically apparent pneumothorax. Cardiomegaly. Air within the left lower lateral chest wall soft tissues. Small left-sided pleural effusion which may be contributing to asymmetric hazy opacity in left thorax IMPRESSION: 1. Removal of left-sided chest tube. No radiographically apparent pneumothorax. 2. Small left pleural effusion which may be contributing to asymmetric hazy opacity in the left thorax. 3. Emphysema and bronchitic changes.  Cardiomegaly. Electronically Signed   By: Esmeralda Hedge M.D.   On: 02/09/2024 21:33   DG CHEST PORT 1 VIEW Result Date: 02/09/2024 CLINICAL DATA:  Status post left lung surgery EXAM: PORTABLE CHEST 1 VIEW COMPARISON:  Film from the previous day. FINDINGS: Cardiac shadow is stable. Postsurgical changes are again noted on the left. Chest tube is again seen. Mild apical capping is noted on the left as well. No definitive pneumothorax is seen. Patchy airspace opacity is noted in the left lung base as well as right lung base stable from the prior exam. Central vascular congestion is again noted and stable. IMPRESSION: Chest tube without evidence of pneumothorax. Small left pleural effusion remains. Bibasilar airspace opacities left greater than right. Electronically Signed   By: Violeta Grey M.D.   On: 02/09/2024 09:25   DG CHEST PORT 1 VIEW Result Date: 02/08/2024 CLINICAL DATA:  Status post lung surgery, follow-up EXAM: PORTABLE CHEST 1 VIEW COMPARISON:  02/07/2024 FINDINGS: Postoperative changes on the left. Left chest tube remains in place. No pneumothorax. Heart is mildly enlarged. Mild vascular congestion. Diffuse left lung airspace opacities and right base atelectasis. Small bilateral effusions. Left subcutaneous emphysema is unchanged. IMPRESSION: Postoperative changes on the left with stable position of left chest tube and no visible pneumothorax. Diffuse left lung airspace disease could reflect atelectasis or infiltrate. Right base opacity, likely atelectasis. Small bilateral effusions. Cardiomegaly, vascular congestion. Electronically Signed   By: Janeece Mechanic M.D.   On: 02/08/2024 09:47   DG Chest Port 1 View Result Date: 02/07/2024 CLINICAL DATA:  Status post left partial lobectomy EXAM: PORTABLE CHEST 1 VIEW COMPARISON:  Film from the previous day. FINDINGS: Postsurgical changes are noted on the left with a chest tube in place. No sizable pneumothorax is noted. Subcutaneous emphysema is again noted and stable. Central vascular congestion is  seen mild right basilar atelectasis is noted. IMPRESSION: Chest tube in place on the left.  No pneumothorax is noted. Mild vascular congestion is noted with right basilar atelectasis. Electronically Signed   By: Violeta Grey M.D.   On: 02/07/2024 10:32   DG Chest Port 1 View Result Date: 02/06/2024 CLINICAL DATA:  Status post partial lobectomy. EXAM: PORTABLE CHEST 1 VIEW COMPARISON:  CT of the chest 02/04/2024.  Chest x-ray 02/06/2004. FINDINGS: Left-sided chest tube has been replaced with distal tip near the apex. No pneumothorax visualized. Left chest wall emphysema is unchanged. There are increasing patchy airspace and interstitial opacities in the left upper lobe and left mid lung. Right lung is grossly clear and unchanged. The cardiomediastinal silhouette is stable, the heart is enlarged. There is a questionable acute fracture of the  left eighth rib intra not definitely seen on prior chest CT. IMPRESSION: 1. Left-sided chest tube has been replaced with distal tip near the apex. No pneumothorax visualized. 2. Increasing patchy airspace and interstitial opacities in the left upper lobe and left mid lung. 3. Questionable acute fracture of the left eighth rib. Electronically Signed   By: Tyron Gallon M.D.   On: 02/06/2024 16:40   DG Chest Port 1 View Result Date: 02/06/2024 CLINICAL DATA:  Pneumothorax EXAM: PORTABLE CHEST 1 VIEW COMPARISON:  02/05/2024 FINDINGS: Single frontal view of the chest demonstrates stable position of the left-sided pigtail pleural drainage catheter. There is persistent loculated left basilar pneumothorax, slightly increased since prior study. No midline shift or tension effect. Cardiac silhouette remains enlarged. Continued pulmonary vascular congestion. Background emphysema again noted. Increased subcutaneous gas within the left lateral chest wall. IMPRESSION: 1. Left-sided pigtail pleural drainage catheter, with slight increase in the loculated left basilar pneumothorax seen  previously. No tension effect or midline shift. 2. Increased subcutaneous gas within the left lateral chest wall, which may signify air leak or recent drainage catheter manipulation. Electronically Signed   By: Bobbye Burrow M.D.   On: 02/06/2024 10:22   DG Abd 1 View Result Date: 02/05/2024 CLINICAL DATA:  Distension EXAM: ABDOMEN - 1 VIEW COMPARISON:  04/26/2023, chest x-ray 02/05/2024 FINDINGS: Left lower chest tube with small pneumothorax at the left base and CP angle. Nonobstructed gas pattern. No radiopaque calculi. IMPRESSION: 1. Nonobstructed gas pattern. 2. Left lower chest tube with small pneumothorax at the left base and CP angle as seen on prior chest x-ray. Electronically Signed   By: Esmeralda Hedge M.D.   On: 02/05/2024 18:38   DG CHEST PORT 1 VIEW Result Date: 02/05/2024 CLINICAL DATA:  78 year old male with bullous emphysema and spontaneous pneumothorax. EXAM: PORTABLE CHEST 1 VIEW COMPARISON:  Chest CT yesterday and earlier. FINDINGS: Portable AP view at 0304 hours. Stable left chest tube position since yesterday morning. Lower lung volumes. Ongoing left pneumothorax, not significantly changed from yesterday and up to moderate by CT (please see that report) Stable cardiac size and mediastinal contours. Stable ventilation otherwise. Negative visible bowel gas.  Stable visualized osseous structures. IMPRESSION: Stable left chest tube position since yesterday morning and unchanged left pneumothorax, which was Moderate by CT yesterday. Consider up sizing tube. Lower lung volumes today.  Bullous emphysema. Electronically Signed   By: Marlise Simpers M.D.   On: 02/05/2024 04:27   CT CHEST WO CONTRAST Result Date: 02/05/2024 CLINICAL DATA:  78 year old male with bullous emphysema and spontaneous pneumothorax. EXAM: CT CHEST WITHOUT CONTRAST TECHNIQUE: Multidetector CT imaging of the chest was performed following the standard protocol without IV contrast. RADIATION DOSE REDUCTION: This exam was performed  according to the departmental dose-optimization program which includes automated exposure control, adjustment of the mA and/or kV according to patient size and/or use of iterative reconstruction technique. COMPARISON:  Chest CT 04/25/2023. Portable chest radiographs 0830 hours the same day and earlier. FINDINGS: Cardiovascular: Mild pneumothorax related mass effect on the right heart border. Overall cardiac size is stable from last year and within normal limits. No pericardial effusion. Vascular patency is not evaluated in the absence of IV contrast. Mild for age Calcified aortic atherosclerosis. Mediastinum/Nodes: Negative for mediastinal mass or lymphadenopathy in the absence of IV contrast. Lungs/Pleura: Bullous emphysema. CT last year demonstrated moderate to severe bullae along the mediastinal contours and in the lung apices more pronounced on the right at that time. Left-side pneumothorax with pigtail  left chest tube in place entering the pleural space via the 4/5 left inter costal space. Moderate volume of residual pleural air especially at the left lung base (series 4, image 119). Confluent opacity in the lingula, segmental left lower lobe is probably lung collapse related atelectasis. The central airways remain patent. No superimposed pleural effusion. Right lung parenchyma appears stable from last year. Upper Abdomen: Negative visible noncontrast liver, gallbladder, spleen pancreas, right adrenal gland, right kidney, and bowel in the upper abdomen. Musculoskeletal: Small volume of chest tube related left lateral chest wall soft tissue gas. Spine degeneration, thoracic scoliosis. Stable visualized osseous structures. IMPRESSION: 1. Bullous Emphysema (ICD10-J43.9) with Moderate-sized residual Left Pneumothorax despite pigtail chest tube in place. Associated mild mass effect on the right heart border. Left lingula and lower lobe atelectasis. No pleural effusion. 2.  Aortic Atherosclerosis (ICD10-I70.0).  Electronically Signed   By: Marlise Simpers M.D.   On: 02/05/2024 04:26   DG Chest Portable 1 View Result Date: 02/04/2024 CLINICAL DATA:  78 year old male chest tube placement for left pneumothorax. EXAM: PORTABLE CHEST 1 VIEW COMPARISON:  Portable chest 0719 hours today.  Chest CT 04/25/2023. FINDINGS: Portable AP semi upright view at 0830 hours. Bullous emphysema demonstrated by CT last year. Pigtail left chest tube has been placed at the area of previous moderate to large left pneumothorax. A small volume of pleural air persists visible both along the medial left lung and at the left costophrenic angle. Moderately good left lung re-expansion. No mediastinal shift. Stable mediastinal contours. Stable right lung. No acute osseous abnormality identified. Negative visible bowel gas. IMPRESSION: Left chest tube placed with small volume residual left pneumothorax. Underlying Bullous Emphysema (ICD10-J43.9). Electronically Signed   By: Marlise Simpers M.D.   On: 02/04/2024 08:37   DG Chest Port 1 View Result Date: 02/04/2024 CLINICAL DATA:  Shortness of breath onset yesterday.  Chest pain. EXAM: PORTABLE CHEST 1 VIEW COMPARISON:  PA and lateral chest 06/09/2023 FINDINGS: Left lung has partially collapsed medially with pneumothorax estimated about 60% of the left chest volume with contralateral mediastinal shift consistent with tension. Emphysematous and coarse chronic interstitial changes are again noted. There is mild biapical pleuroparenchymal scar-like opacity. There is compressive atelectasis in the left lung, no focal infiltrate on the right. There is mild cardiomegaly. No vascular congestion is seen. Mediastinum is normally outlined. There is calcification in the transverse aorta. Mild thoracic dextroscoliosis with no new osseous finding. Osteopenia. IMPRESSION: 1. Left pneumothorax estimated about 60% of the left chest volume with contralateral mediastinal shift consistent with tension. 2. Emphysematous and chronic  changes. 3. Mild cardiomegaly. 4. Aortic atherosclerosis. 5. Critical Value/emergent results were called by telephone at the time of interpretation on 02/04/2024 at 7:40 am to provider [facility] ZAMMIT , who verbally acknowledged these results. Electronically Signed   By: Denman Fischer M.D.   On: 02/04/2024 07:40     Subjective: Patient seen examined bedside, sitting in bedside chair.  Daughter present.  Chest tube removed by cardiothoracic surgery yesterday, repeat chest x-ray this morning stable.  Stable for discharge home.  Requiring 2 L nasal cannula with ambulation.  No other [place] questions or concerns at this time.  Has follow-up scheduled with CTS on 02/28/2024.  Patient denies headache, no dizziness, no chest pain, no palpitations, no shortness of breath greater than his typical baseline, no fever/chills/night sweats, no nausea/vomiting/diarrhea, no abdominal pain, no focal weakness, no fatigue, no paresthesia.  No acute events overnight per nursing staff.  Discharge Exam: Vitals:  02/10/24 0800 02/10/24 1138  BP:  126/73  Pulse:  98  Resp:  16  Temp:  98.3 F (36.8 C)  SpO2: 96% 93%   Vitals:   02/10/24 0714 02/10/24 0757 02/10/24 0800 02/10/24 1138  BP: 125/74   126/73  Pulse: 94 93  98  Resp: 17 18  16   Temp: 98.4 F (36.9 C)   98.3 F (36.8 C)  TempSrc: Oral   Oral  SpO2: 94% 96% 96% 93%  Weight:      Height:        Physical Exam: GEN: NAD, alert and oriented x 3, elderly in appearance HEENT: NCAT, PERRL, EOMI, sclera clear, MMM PULM: Breath sound slight diminished breath bases, no wheezes/crackles, normal respiratory effort without accessory muscle use, 2 L nasal cannula with SPO2 96% at rest; desaturated to 85% with ambulation on room air yesterday CV: RRR w/o M/G/R GI: abd soft, NTND, NABS, no R/G/M MSK: no peripheral edema, muscle strength globally intact 5/5 bilateral upper/lower extremities NEURO: CN II-XII intact, no focal deficits, sensation to light touch  intact PSYCH: normal mood/affect Integumentary: dry/intact, no rashes or wounds    The results of significant diagnostics from this hospitalization (including imaging, microbiology, ancillary and laboratory) are listed below for reference.     Microbiology: Recent Results (from the past 240 hours)  Resp panel by RT-PCR (RSV, Flu A&B, Covid) Anterior Nasal Swab     Status: None   Collection Time: 02/04/24  7:30 AM   Specimen: Anterior Nasal Swab  Result Value Ref Range Status   SARS Coronavirus 2 by RT PCR NEGATIVE NEGATIVE Final    Comment: (NOTE) SARS-CoV-2 target nucleic acids are NOT DETECTED.  The SARS-CoV-2 RNA is generally detectable in upper respiratory specimens during the acute phase of infection. The lowest concentration of SARS-CoV-2 viral copies this assay can detect is 138 copies/mL. A negative result does not preclude SARS-Cov-2 infection and should not be used as the sole basis for treatment or other patient management decisions. A negative result may occur with  improper specimen collection/handling, submission of specimen other than nasopharyngeal swab, presence of viral mutation(s) within the areas targeted by this assay, and inadequate number of viral copies(<138 copies/mL). A negative result must be combined with clinical observations, patient history, and epidemiological information. The expected result is Negative.  Fact Sheet for Patients:  BloggerCourse.com  Fact Sheet for Healthcare Providers:  SeriousBroker.it  This test is no t yet approved or cleared by the [place]  FDA and  has been authorized for detection and/or diagnosis of SARS-CoV-2 by FDA under an Emergency Use Authorization (EUA). This EUA will remain  in effect (meaning this test can be used) for the duration of the COVID-19 declaration under Section 564(b)(1) of the Act, 21 U.S.C.section 360bbb-3(b)(1), unless the authorization is  terminated  or revoked sooner.       Influenza A by PCR NEGATIVE NEGATIVE Final   Influenza B by PCR NEGATIVE NEGATIVE Final    Comment: (NOTE) The Xpert Xpress SARS-CoV-2/FLU/RSV plus assay is intended as an aid in the diagnosis of influenza from Nasopharyngeal swab specimens and should not be used as a sole basis for treatment. Nasal washings and aspirates are unacceptable for Xpert Xpress SARS-CoV-2/FLU/RSV testing.  Fact Sheet for Patients: BloggerCourse.com  Fact Sheet for Healthcare Providers: SeriousBroker.it  This test is not yet approved or cleared by the [place]  FDA and has been authorized for detection and/or diagnosis of SARS-CoV-2 by FDA under an Emergency Use Authorization (  EUA). This EUA will remain in effect (meaning this test can be used) for the duration of the COVID-19 declaration under Section 564(b)(1) of the Act, 21 U.S.C. section 360bbb-3(b)(1), unless the authorization is terminated or revoked.     Resp Syncytial Virus by PCR NEGATIVE NEGATIVE Final    Comment: (NOTE) Fact Sheet for Patients: BloggerCourse.com  Fact Sheet for Healthcare Providers: SeriousBroker.it  This test is not yet approved or cleared by the [place]  FDA and has been authorized for detection and/or diagnosis of SARS-CoV-2 by FDA under an Emergency Use Authorization (EUA). This EUA will remain in effect (meaning this test can be used) for the duration of the COVID-19 declaration under Section 564(b)(1) of the Act, 21 U.S.C. section 360bbb-3(b)(1), unless the authorization is terminated or revoked.  Performed at [facility], [address] [place], [place] [address]   MRSA Next Gen by PCR, Nasal     Status: Abnormal   Collection Time: 02/04/24  2:02 PM   Specimen: Nasal Mucosa; Nasal Swab  Result Value Ref Range Status   MRSA by PCR Next Gen DETECTED (A) NOT  DETECTED Final    Comment: CRITICAL RESULT CALLED TO, READ BACK BY AND VERIFIED WITH: RN MONICA SIXTOS [id number] BY J RAZZAK, MT (NOTE) The GeneXpert MRSA Assay (FDA approved for NASAL specimens only), is one component of a comprehensive MRSA colonization surveillance program. It is not intended to diagnose MRSA infection nor to guide or monitor treatment for MRSA infections. Test performance is not FDA approved in patients less than 79 years old. Performed at [facility], [address], [place], [place] [address]      Labs: BNP (last 3 results) Recent Labs    02/04/24 0713  BNP 123.0*   Basic Metabolic Panel: Recent Labs  Lab 02/04/24 0705 02/05/24 1525 02/06/24 1058 02/07/24 0301 02/08/24 0808 02/09/24 0320  NA 138 138 139 133* 133* 136  K 4.4 4.2 4.8 5.1 4.3 4.4  CL 106 105  --  103 99 105  CO2 24 22  --  25 26 23   GLUCOSE 103* 104*  --  121* 90 96  BUN 15 29*  --  29* 26* 25*  CREATININE 1.02 1.25*  --  1.32* 1.11 0.95  CALCIUM 9.5 9.1  --  8.1* 8.4* 8.4*   Liver Function Tests: Recent Labs  Lab 02/04/24 0705 02/05/24 1525 02/08/24 0808  AST 17 16 22   ALT 16 14 12   ALKPHOS 55 43 36*  BILITOT 0.6 0.9 1.4*  PROT 8.3* 6.9 6.0*  ALBUMIN  4.1 3.4* 2.9*   No results for input(s): "LIPASE", "AMYLASE" in the last 168 hours. No results for input(s): "AMMONIA" in the last 168 hours. CBC: Recent Labs  Lab 02/04/24 0705 02/05/24 1525 02/06/24 1058 02/07/24 0301 02/08/24 0808 02/09/24 0320  WBC 9.6 13.9*  --  12.5* 14.1* 13.7*  NEUTROABS 6.5  --   --   --   --   --   HGB 16.3 15.3 13.3 12.6* 12.8* 11.7*  HCT 50.7 47.5 39.0 39.5 39.7 36.2*  MCV 83.4 82.6  --  82.8 82.7 82.6  PLT 227 215  --  183 204 189   Cardiac Enzymes: No results for input(s): "CKTOTAL", "CKMB", "CKMBINDEX", "TROPONINI" in the last 168 hours. BNP: Invalid input(s): "POCBNP" CBG: No results for input(s): "GLUCAP" in the last 168 hours. D-Dimer No results for input(s):  "DDIMER" in the last 72 hours. Hgb A1c No results for input(s): "HGBA1C" in the  last 72 hours. Lipid Profile No results for input(s): "CHOL", "HDL", "LDLCALC", "TRIG", "CHOLHDL", "LDLDIRECT" in the last 72 hours. Thyroid  function studies No results for input(s): "TSH", "T4TOTAL", "T3FREE", "THYROIDAB" in the last 72 hours.  Invalid input(s): "FREET3" Anemia work up No results for input(s): "VITAMINB12", "FOLATE", "FERRITIN", "TIBC", "IRON", "RETICCTPCT" in the last 72 hours. Urinalysis    Component Value Date/Time   COLORURINE AMBER (A) 08/28/2016 1645   APPEARANCEUR CLOUDY (A) 08/28/2016 1645   LABSPEC 1.020 08/28/2016 1645   PHURINE 5.0 08/28/2016 1645   GLUCOSEU NEGATIVE 08/28/2016 1645   HGBUR NEGATIVE 08/28/2016 1645   BILIRUBINUR SMALL (A) 08/28/2016 1645   KETONESUR NEGATIVE 08/28/2016 1645   PROTEINUR NEGATIVE 08/28/2016 1645   UROBILINOGEN 0.2 04/17/2014 1620   NITRITE NEGATIVE 08/28/2016 1645   LEUKOCYTESUR NEGATIVE 08/28/2016 1645   Sepsis Labs Recent Labs  Lab 02/05/24 1525 02/07/24 0301 02/08/24 0808 02/09/24 0320  WBC 13.9* 12.5* 14.1* 13.7*   Microbiology Recent Results (from the past 240 hours)  Resp panel by RT-PCR (RSV, Flu A&B, Covid) Anterior Nasal Swab     Status: None   Collection Time: 02/04/24  7:30 AM   Specimen: Anterior Nasal Swab  Result Value Ref Range Status   SARS Coronavirus 2 by RT PCR NEGATIVE NEGATIVE Final    Comment: (NOTE) SARS-CoV-2 target nucleic acids are NOT DETECTED.  The SARS-CoV-2 RNA is generally detectable in upper respiratory specimens during the acute phase of infection. The lowest concentration of SARS-CoV-2 viral copies this assay can detect is 138 copies/mL. A negative result does not preclude SARS-Cov-2 infection and should not be used as the sole basis for treatment or other patient management decisions. A negative result may occur with  improper specimen collection/handling, submission of specimen other than  nasopharyngeal swab, presence of viral mutation(s) within the areas targeted by this assay, and inadequate number of viral copies(<138 copies/mL). A negative result must be combined with clinical observations, patient history, and epidemiological information. The expected result is Negative.  Fact Sheet for Patients:  BloggerCourse.com  Fact Sheet for Healthcare Providers:  SeriousBroker.it  This test is no t yet approved or cleared by the [place]  FDA and  has been authorized for detection and/or diagnosis of SARS-CoV-2 by FDA under an Emergency Use Authorization (EUA). This EUA will remain  in effect (meaning this test can be used) for the duration of the COVID-19 declaration under Section 564(b)(1) of the Act, 21 U.S.C.section 360bbb-3(b)(1), unless the authorization is terminated  or revoked sooner.       Influenza A by PCR NEGATIVE NEGATIVE Final   Influenza B by PCR NEGATIVE NEGATIVE Final    Comment: (NOTE) The Xpert Xpress SARS-CoV-2/FLU/RSV plus assay is intended as an aid in the diagnosis of influenza from Nasopharyngeal swab specimens and should not be used as a sole basis for treatment. Nasal washings and aspirates are unacceptable for Xpert Xpress SARS-CoV-2/FLU/RSV testing.  Fact Sheet for Patients: BloggerCourse.com  Fact Sheet for Healthcare Providers: SeriousBroker.it  This test is not yet approved or cleared by the [place]  FDA and has been authorized for detection and/or diagnosis of SARS-CoV-2 by FDA under an Emergency Use Authorization (EUA). This EUA will remain in effect (meaning this test can be used) for the duration of the COVID-19 declaration under Section 564(b)(1) of the Act, 21 U.S.C. section 360bbb-3(b)(1), unless the authorization is terminated or revoked.     Resp Syncytial Virus by PCR NEGATIVE NEGATIVE Final    Comment:  (NOTE) Fact Sheet  for Patients: BloggerCourse.com  Fact Sheet for Healthcare Providers: SeriousBroker.it  This test is not yet approved or cleared by the [place]  FDA and has been authorized for detection and/or diagnosis of SARS-CoV-2 by FDA under an Emergency Use Authorization (EUA). This EUA will remain in effect (meaning this test can be used) for the duration of the COVID-19 declaration under Section 564(b)(1) of the Act, 21 U.S.C. section 360bbb-3(b)(1), unless the authorization is terminated or revoked.  Performed at [facility], [address], [place], [place] [address]   MRSA Next Gen by PCR, Nasal     Status: Abnormal   Collection Time: 02/04/24  2:02 PM   Specimen: Nasal Mucosa; Nasal Swab  Result Value Ref Range Status   MRSA by PCR Next Gen DETECTED (A) NOT DETECTED Final    Comment: CRITICAL RESULT CALLED TO, READ BACK BY AND VERIFIED WITH: RN MONICA SIXTOS [id number] BY J RAZZAK, MT (NOTE) The GeneXpert MRSA Assay (FDA approved for NASAL specimens only), is one component of a comprehensive MRSA colonization surveillance program. It is not intended to diagnose MRSA infection nor to guide or monitor treatment for MRSA infections. Test performance is not FDA approved in patients less than 23 years old. Performed at [facility], [address], [place], [place] [address]      Time coordinating discharge: Over 30 minutes  SIGNED:   Rema Care [place], DO  [facility] 02/10/2024, 11:46 AM

## 2024-02-10 NOTE — TOC Transition Note (Signed)
 Transition of Care [facility]) - Discharge Note   Patient Details  Name: Michael Ellison MRN: [id number] Date of Birth: 1946-04-08  [facility]) CM/SW Contact:  Jennett Model, RN Phone Number: 02/10/2024, 12:22 PM   Clinical Narrative:    For dc today, NCM notified Amy with [facility], and [employer] to supply home oxygen .      Barriers to Discharge: Continued Medical Work up   Patient Goals and CMS Choice Patient states their goals for this hospitalization and ongoing recovery are:: to return home          Discharge Placement                       Discharge Plan and Services Additional resources added to the After Visit Summary for                              [facility] Agency: [facility] Date [facility] Agency Contacted: 02/09/24 Time HH Agency Contacted: 1418 Representative spoke with at [facility] Agency: Amy  Social Drivers of Health (SDOH) Interventions SDOH Screenings   Food Insecurity: No Food Insecurity (02/04/2024)  Housing: Low Risk  (02/04/2024)  Transportation Needs: No Transportation Needs (02/04/2024)  Utilities: Not At Risk (02/04/2024)  Depression (PHQ2-9): Low Risk  (12/14/2018)  Social Connections: Moderately Integrated (02/04/2024)  Tobacco Use: Medium Risk (02/06/2024)     Readmission Risk Interventions     No data to display

## 2024-02-10 NOTE — Progress Notes (Signed)
 4 Days Post-Op Procedure(s) (LRB): WEDGE RESECTION, LUNG, ROBOT-ASSISTED, THORACOSCOPIC, BLEB RESECTION (Left) Subjective: Didn't sleep well, some pain "not bad"  Objective: Vital signs in last 24 hours: Temp:  [98.1 F (36.7 C)-98.5 F (36.9 C)] 98.4 F (36.9 C) (04/25 0714) Pulse Rate:  [86-98] 93 (04/25 0757) Cardiac Rhythm: Normal sinus rhythm (04/25 0705) Resp:  [17-20] 18 (04/25 0757) BP: (104-133)/(59-82) 125/74 (04/25 0714) SpO2:  [92 %-97 %] 96 % (04/25 0800) FiO2 (%):  [28 %] 28 % (04/25 0800)  Hemodynamic parameters for last 24 hours:    Intake/Output from previous day: 04/24 0701 - 04/25 0700 In: 960 [P.O.:960] Out: 750 [Urine:550; Chest Tube:200] Intake/Output this shift: No intake/output data recorded.  General appearance: alert, cooperative, and no distress Neurologic: intact Heart: regular rate and rhythm Lungs: diminished breath sounds left base  Lab Results: Recent Labs    02/08/24 0808 02/09/24 0320  WBC 14.1* 13.7*  HGB 12.8* 11.7*  HCT 39.7 36.2*  PLT 204 189   BMET:  Recent Labs    02/08/24 0808 02/09/24 0320  NA 133* 136  K 4.3 4.4  CL 99 105  CO2 26 23  GLUCOSE 90 96  BUN 26* 25*  CREATININE 1.11 0.95  CALCIUM 8.4* 8.4*    PT/INR: No results for input(s): "LABPROT", "INR" in the last 72 hours. ABG    Component Value Date/Time   PHART 7.298 (L) 02/06/2024 1058   HCO3 24.6 02/06/2024 1058   TCO2 26 02/06/2024 1058   ACIDBASEDEF 2.0 02/06/2024 1058   O2SAT 92 02/06/2024 1058   CBG (last 3)  No results for input(s): "GLUCAP" in the last 72 hours.  Assessment/Plan: S/P Procedure(s) (LRB): WEDGE RESECTION, LUNG, ROBOT-ASSISTED, THORACOSCOPIC, BLEB RESECTION (Left) POD # 4 Overall looks good Pain well controlled CXR with postop change on left On 2L [place] O2, was on home O2 PRN PTA Ok for dc from a surgical standpoint   LOS: 6 days    [name] 02/10/2024

## 2024-02-10 NOTE — TOC Progression Note (Addendum)
 Transition of Care [facility]) - Progression Note    Patient Details  Name: Michael Ellison MRN: [id number] Date of Birth: 09-Jul-1946  Transition of Care [facility]) CM/SW Contact  Jennett Model, RN Phone Number: 02/10/2024, 11:26 AM  Clinical Narrative:    NCM notified that patient needs a oxygen  tank to go home with.  NCM contacted patient, he does not know who he has oxygen  with.  NCM contacted [employer], he does not have it with them, awaiting to hear back from [place],  [facility] states he does not have oxygen  with them,  [employer] [facility] is checking awaiting to hear back. [employer] states he does not have oxygen  with them.  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  Patient is going to need oxygen ,  will need to get a ambulatory sat and oxygen  order .  NCM informed Staff RN and MD.   Patient states he does not care who he get the oxygen  from he just wants to go home.  NCM made referral to Marlou Sims with [employer].  He will supply the oxygen  to room.     Expected Discharge Plan: Home/Self Care Barriers to Discharge: Continued Medical Work up  Expected Discharge Plan and Services       Living arrangements for the past 2 months: Single Family Home                             HH Agency: [facility] Date [facility] Agency Contacted: 02/09/24 Time HH Agency Contacted: 1418 Representative spoke with at [facility] Agency: Amy   Social Determinants of Health (SDOH) Interventions SDOH Screenings   Food Insecurity: No Food Insecurity (02/04/2024)  Housing: Low Risk  (02/04/2024)  Transportation Needs: No Transportation Needs (02/04/2024)  Utilities: Not At Risk (02/04/2024)  Depression (PHQ2-9): Low Risk  (12/14/2018)  Social Connections: Moderately Integrated (02/04/2024)  Tobacco Use: Medium Risk (02/06/2024)    Readmission Risk Interventions     No data to display

## 2024-02-13 NOTE — Transitions of Care (Post Inpatient/ED Visit) (Signed)
 02/13/2024  Patient ID: Michael Ellison, male   DOB: Jan 07, 1946, 78 y.o.   MRN: [id number]  Medication Review for transitions of care.  See [employer] for documentation.   Tanyla Stege J. Ambrosia Wisnewski RN, MSN [facility], [facility] RN Care Manager Direct Dial: [phone]  Fax: [phone] Website: [email or web address]

## 2024-02-14 DIAGNOSIS — K219 Gastro-esophageal reflux disease without esophagitis: Secondary | ICD-10-CM | POA: Diagnosis not present

## 2024-02-14 DIAGNOSIS — J9611 Chronic respiratory failure with hypoxia: Secondary | ICD-10-CM | POA: Diagnosis not present

## 2024-02-14 DIAGNOSIS — I10 Essential (primary) hypertension: Secondary | ICD-10-CM | POA: Diagnosis not present

## 2024-02-14 DIAGNOSIS — K589 Irritable bowel syndrome without diarrhea: Secondary | ICD-10-CM | POA: Diagnosis not present

## 2024-02-14 DIAGNOSIS — F17210 Nicotine dependence, cigarettes, uncomplicated: Secondary | ICD-10-CM | POA: Diagnosis not present

## 2024-02-14 DIAGNOSIS — J439 Emphysema, unspecified: Secondary | ICD-10-CM | POA: Diagnosis not present

## 2024-02-14 DIAGNOSIS — Z48813 Encounter for surgical aftercare following surgery on the respiratory system: Secondary | ICD-10-CM | POA: Diagnosis not present

## 2024-02-14 DIAGNOSIS — I471 Supraventricular tachycardia, unspecified: Secondary | ICD-10-CM | POA: Diagnosis not present

## 2024-02-20 DIAGNOSIS — J449 Chronic obstructive pulmonary disease, unspecified: Secondary | ICD-10-CM | POA: Diagnosis not present

## 2024-02-20 DIAGNOSIS — I10 Essential (primary) hypertension: Secondary | ICD-10-CM | POA: Diagnosis not present

## 2024-02-20 DIAGNOSIS — Z9981 Dependence on supplemental oxygen: Secondary | ICD-10-CM | POA: Diagnosis not present

## 2024-02-20 DIAGNOSIS — J9621 Acute and chronic respiratory failure with hypoxia: Secondary | ICD-10-CM | POA: Diagnosis not present

## 2024-02-21 ENCOUNTER — Telehealth: Payer: Self-pay

## 2024-02-21 NOTE — Transitions of Care (Post Inpatient/ED Visit) (Signed)
 02/21/2024  Patient ID: Michael Ellison, male   DOB: [birth year], 78 y.o.   MRN: [id number]  Medication Review for transitions of care.  See [employer] for documentation.  Azrielle Springsteen J. Ottis Sarnowski RN, MSN [facility], [facility] RN Care Manager Direct Dial: [phone]  Fax: [phone] Website: [email or web address]

## 2024-02-22 DIAGNOSIS — J439 Emphysema, unspecified: Secondary | ICD-10-CM | POA: Diagnosis not present

## 2024-02-22 DIAGNOSIS — I10 Essential (primary) hypertension: Secondary | ICD-10-CM | POA: Diagnosis not present

## 2024-02-22 DIAGNOSIS — K219 Gastro-esophageal reflux disease without esophagitis: Secondary | ICD-10-CM | POA: Diagnosis not present

## 2024-02-22 DIAGNOSIS — K589 Irritable bowel syndrome without diarrhea: Secondary | ICD-10-CM | POA: Diagnosis not present

## 2024-02-22 DIAGNOSIS — F17210 Nicotine dependence, cigarettes, uncomplicated: Secondary | ICD-10-CM | POA: Diagnosis not present

## 2024-02-22 DIAGNOSIS — J9611 Chronic respiratory failure with hypoxia: Secondary | ICD-10-CM | POA: Diagnosis not present

## 2024-02-22 DIAGNOSIS — Z48813 Encounter for surgical aftercare following surgery on the respiratory system: Secondary | ICD-10-CM | POA: Diagnosis not present

## 2024-02-22 DIAGNOSIS — I471 Supraventricular tachycardia, unspecified: Secondary | ICD-10-CM | POA: Diagnosis not present

## 2024-02-23 DIAGNOSIS — I471 Supraventricular tachycardia, unspecified: Secondary | ICD-10-CM | POA: Diagnosis not present

## 2024-02-23 DIAGNOSIS — K589 Irritable bowel syndrome without diarrhea: Secondary | ICD-10-CM | POA: Diagnosis not present

## 2024-02-23 DIAGNOSIS — Z48813 Encounter for surgical aftercare following surgery on the respiratory system: Secondary | ICD-10-CM | POA: Diagnosis not present

## 2024-02-23 DIAGNOSIS — J9611 Chronic respiratory failure with hypoxia: Secondary | ICD-10-CM | POA: Diagnosis not present

## 2024-02-23 DIAGNOSIS — J439 Emphysema, unspecified: Secondary | ICD-10-CM | POA: Diagnosis not present

## 2024-02-23 DIAGNOSIS — K219 Gastro-esophageal reflux disease without esophagitis: Secondary | ICD-10-CM | POA: Diagnosis not present

## 2024-02-23 DIAGNOSIS — F17210 Nicotine dependence, cigarettes, uncomplicated: Secondary | ICD-10-CM | POA: Diagnosis not present

## 2024-02-23 DIAGNOSIS — I10 Essential (primary) hypertension: Secondary | ICD-10-CM | POA: Diagnosis not present

## 2024-02-27 ENCOUNTER — Other Ambulatory Visit: Payer: Self-pay | Admitting: Thoracic Surgery (Cardiothoracic Vascular Surgery)

## 2024-02-27 DIAGNOSIS — J9383 Other pneumothorax: Secondary | ICD-10-CM

## 2024-02-28 ENCOUNTER — Ambulatory Visit (HOSPITAL_COMMUNITY)
Admission: RE | Admit: 2024-02-28 | Discharge: 2024-02-28 | Disposition: A | Discharge status: Home / Self Care | Source: Ambulatory Visit | Attending: Cardiovascular Disease | Admitting: Cardiovascular Disease

## 2024-02-28 ENCOUNTER — Ambulatory Visit
Attending: Thoracic Surgery (Cardiothoracic Vascular Surgery) | Admitting: Thoracic Surgery (Cardiothoracic Vascular Surgery)

## 2024-02-28 ENCOUNTER — Encounter: Payer: Self-pay | Admitting: Thoracic Surgery (Cardiothoracic Vascular Surgery)

## 2024-02-28 VITALS — BP 133/77 | HR 80 | Resp 20

## 2024-02-28 DIAGNOSIS — J9383 Other pneumothorax: Secondary | ICD-10-CM

## 2024-02-28 DIAGNOSIS — J939 Pneumothorax, unspecified: Secondary | ICD-10-CM | POA: Diagnosis not present

## 2024-02-28 DIAGNOSIS — Z48813 Encounter for surgical aftercare following surgery on the respiratory system: Secondary | ICD-10-CM | POA: Diagnosis not present

## 2024-02-28 DIAGNOSIS — R918 Other nonspecific abnormal finding of lung field: Secondary | ICD-10-CM | POA: Diagnosis not present

## 2024-02-28 NOTE — Progress Notes (Signed)
 [address]       Michael Ellison [address]             [phone]       HPI: Michael Ellison returns for a scheduled follow-up after his recent VATS for bleb resection for a spontaneous pneumothorax.  Michael Ellison is a 78 year old man with a history of tobacco abuse, COPD, home oxygen , heart murmur, mild mitral regurgitation, aspiration pneumonia, hypertension, paroxysmal SVT, ventral hernia, prostate cancer, ethanol abuse, depression, arthritis, and a spontaneous pneumothorax.  He had a history of a pneumothorax after an abdominal surgery in the past.  He presented to [facility] with a left tension pneumothorax.  Dr. Larrie Po placed a chest tube.  CT showed extensive blood disease.  On 02/07/2024 he underwent a robotic left VATS for bleb resection, lysis of adhesions, and pleurodesis.  His postoperative course was unremarkable and he went home on day 3.  He feels well.  He has occasional pain in the left chest but only takes Tylenol  "every once in a while."  Not requiring narcotics.  Breathing is not quite back to his baseline.  He is on oxygen  as he was preoperatively.  Past Medical History:  Diagnosis Date   Arthritis    Aspiration pneumonia (HCC) 03/01/2012   Cancer of prostate (HCC)    COPD (chronic obstructive pulmonary disease) (HCC)    Depression    Dyspnea    GERD (gastroesophageal reflux disease)    Heart murmur    Hernia of abdominal cavity    Hypertension    Leaky heart valve    On home O2 PRN-does not use all the time.    3L prn    Current Outpatient Medications  Medication Sig Dispense Refill   acetaminophen  (TYLENOL ) 500 MG tablet Take 500 mg by mouth every 6 (six) hours as needed for headache.      amLODipine  (NORVASC ) 2.5 MG tablet Take 2.5 mg by mouth daily.     bisoprolol  (ZEBETA ) 5 MG tablet Take 5 mg by mouth daily.     furosemide  (LASIX ) 40 MG tablet Take 40 mg by mouth daily.     gabapentin  (NEURONTIN ) 300 MG capsule Take 300 mg by mouth  daily as needed (for pain).     ipratropium-albuterol  (DUONEB) 0.5-2.5 (3) MG/3ML SOLN Take 3 mLs by nebulization every 6 (six) hours as needed (for shortness of breath/wheezing).      lubiprostone  (AMITIZA ) 24 MCG capsule Take one capsule once to twice daily for constipation. 60 capsule 3   meclizine (ANTIVERT) 25 MG tablet Take 25 mg by mouth 3 (three) times daily.     meloxicam (MOBIC) 7.5 MG tablet Take 7.5 mg by mouth 2 (two) times daily.     olmesartan  (BENICAR ) 20 MG tablet Take 1 tablet (20 mg total) by mouth daily. 100 tablet 3   SYMBICORT  160-4.5 MCG/ACT inhaler Inhale 1 puff into the lungs 2 (two) times daily.     No current facility-administered medications for this visit.    Physical Exam BP 133/77   Pulse 80   Resp 20   SpO2 96% Comment: 2L O2 per Michael Ellison 78 year old man in no acute distress Alert and oriented x 3 with no focal deficits Lungs diminished breath sounds bilaterally, no wheezing Incisions clean dry and intact Cardiac regular rate and rhythm  Diagnostic Tests: I personally reviewed his chest x-ray images.  Official reading has not yet been done.  Postoperative changes on the left.  Good  postsurgical result.  Impression: Michael Ellison is a 78 year old man with a history of tobacco abuse, COPD, home oxygen , heart murmur, mild mitral regurgitation, aspiration pneumonia, hypertension, paroxysmal SVT, ventral hernia, prostate cancer, ethanol abuse, depression, arthritis, and a spontaneous pneumothorax.  Spontaneous left pneumothorax-in setting of severe emphysema with emphysematous blebs.  Underwent VATS for bleb resection.  He had extensive adhesions in the chest.  Did well postoperatively.  He is now about 3 weeks out from surgery.  He is doing well.  Has some discomfort but only occasionally is having to take Tylenol .  Not taking anything stronger than that.  I advised him not to lift anything over 10 pounds or do any heavy exertion for the next 3 weeks.  Otherwise  no restrictions on his activities.  Plan: I will be happy to see Michael Ellison back anytime in the future if I can be of any further assistance with his care  [name], MD [facility] [phone]

## 2024-02-29 ENCOUNTER — Telehealth: Payer: Self-pay

## 2024-02-29 NOTE — Transitions of Care (Post Inpatient/ED Visit) (Signed)
 02/29/2024  Patient ID: Michael Ellison, male   DOB: July 11, 1946, 78 y.o.   MRN: [id number]  Medication Review for transitions of care.  See [employer] for documentation.  Alexi Geibel J. Felita Bump RN, MSN [facility], [facility] Health RN Care Manager Direct Dial: [phone]  Fax: [phone] Website: [email or web address]

## 2024-03-07 ENCOUNTER — Telehealth: Payer: Self-pay

## 2024-03-07 NOTE — Transitions of Care (Post Inpatient/ED Visit) (Signed)
 03/07/2024  Patient ID: Michael Ellison, male   DOB: 1946/05/02, 78 y.o.   MRN: [id number]  Medication Review for transitions of care.  See [employer] for  documentation.  Breion Novacek J. Avraj Lindroth RN, MSN [facility], [facility] RN Care Manager Direct Dial: [phone]  Fax: [phone] Website: [email or web address]

## 2024-03-14 ENCOUNTER — Telehealth: Payer: Self-pay

## 2024-03-14 NOTE — Transitions of Care (Post Inpatient/ED Visit) (Signed)
 03/14/2024  Patient ID: Michael Ellison, male   DOB: [birth year], 78 y.o.   MRN: [id number]  Medication Review for transitions of care.  See [employer] for  documentation.  Loralei Radcliffe J. Millie Shorb RN, MSN [facility], [facility] Health RN Care Manager Direct Dial: [phone]  Fax: [phone] Website: [email or web address]

## 2024-03-18 NOTE — Progress Notes (Unsigned)
 Michael Ellison, male    DOB: 1946/09/13    MRN: [id number]  Brief patient profile:  37  yobm  quit smoking 2013 p prolonged admit with abd pain > adhesions > dehiscence>   trach self referred to pulmonary clinic in [place]  07/12/2023 for doe not proportional to activity     Spirometry  06/22/16  FEV1 2.04 (55%)  Ratio 0.62 with min curvature     History of Present Illness  07/12/2023  Pulmonary/ 1st office eval/ Givanni Staron / [place] [facility]  Chief Complaint  Patient presents with   COPD    Consult for COPD   Dyspnea:  walks [employer] ok but sometimes  can't get to the car in driveway  Cough: dry cough  more day more hoarse  Sleep: level bed  2 pillow with gasping for breath and sits on side of bed SABA use: does not have one  02: prn but not checking sats  Rec Stop lisinopril  and start olmesartan   20 mg one daily in its place  Work on inhaler technique:  >>>  Remember how golfers warm up by taking practice swings - do this with an empty inhaler  Symbicort  160 up to 2 puff every  12 hours - take at least 15 minutes before planned activity  Please schedule a follow up office visit in 4-6 weeks, sooner if needed  with all medications /inhalers/ solutions in hand    08/09/2023  f/u ov/[place] office/Francenia Chimenti re: GOLD 2 copd using 02 prn/ Mitral regurg with severe LAE   maint on symbicort  160 did not  bring meds  Chief Complaint  Patient presents with   COPD    Gold 2  Dyspnea:  slower than others anywhere [facility] = can't walk a nl pace on a flat grade s sob but does fine slow and flat  Cough: none  Sleeping: flat bed 2 pillows s    resp cc  SABA use: none  02: none  Rec    03/22/2024  f/u ov/[place] office/Wyman Meschke re: *** maint on ***  No chief complaint on file.   Dyspnea:  *** Cough: *** Sleeping: ***   resp cc  SABA use: *** 02: ***  Lung cancer screening: ***   No obvious day to day or daytime variability or assoc excess/ purulent sputum or mucus plugs or hemoptysis or  cp or chest tightness, subjective wheeze or overt sinus or hb symptoms.    Also denies any obvious fluctuation of symptoms with weather or environmental changes or other aggravating or alleviating factors except as outlined above   No unusual exposure hx or h/o childhood pna/ asthma or knowledge of premature birth.  Current Allergies, Complete Past Medical History, Past Surgical History, Family History, and Social History were reviewed in [employer] record.  ROS  The following are not active complaints unless bolded Hoarseness, sore throat, dysphagia, dental problems, itching, sneezing,  nasal congestion or discharge of excess mucus or purulent secretions, ear ache,   fever, chills, sweats, unintended wt loss or wt gain, classically pleuritic or exertional cp,  orthopnea pnd or arm/hand swelling  or leg swelling, presyncope, palpitations, abdominal pain, anorexia, nausea, vomiting, diarrhea  or change in bowel habits or change in bladder habits, change in stools or change in urine, dysuria, hematuria,  rash, arthralgias, visual complaints, headache, numbness, weakness or ataxia or problems with walking or coordination,  change in mood or  memory.        No outpatient medications  have been marked as taking for the 03/22/24 encounter (Appointment) with Diamond Formica, MD.             Past Medical History:  Diagnosis Date   Arthritis    Aspiration pneumonia (HCC) 03/01/2012   Cancer of prostate (HCC)    COPD (chronic obstructive pulmonary disease) (HCC)    Depression    GERD (gastroesophageal reflux disease)    Hernia of abdominal cavity    Hypertension    On home O2    3L prn      Objective:    Wts   03/22/2024         ***   08/09/23 202 lb (91.6 kg)  07/12/23 195 lb (88.5 kg)  07/05/23 197 lb 6.4 oz (89.5 kg)     Vital signs reviewed  03/22/2024  - Note at rest 02 sats  ***% on ***   General appearance:    ***        rach scar in place ***   Min  barr ***   2-3/6 HSM ***                  Assessment

## 2024-03-22 ENCOUNTER — Ambulatory Visit: Admitting: Internal Medicine

## 2024-03-22 ENCOUNTER — Encounter: Payer: Self-pay | Admitting: Internal Medicine

## 2024-03-22 VITALS — BP 146/83 | HR 95 | Ht 74.0 in | Wt 220.0 lb

## 2024-03-22 DIAGNOSIS — J449 Chronic obstructive pulmonary disease, unspecified: Secondary | ICD-10-CM

## 2024-03-22 DIAGNOSIS — I10 Essential (primary) hypertension: Secondary | ICD-10-CM | POA: Diagnosis not present

## 2024-03-22 DIAGNOSIS — Z87891 Personal history of nicotine dependence: Secondary | ICD-10-CM

## 2024-03-22 DIAGNOSIS — J9611 Chronic respiratory failure with hypoxia: Secondary | ICD-10-CM

## 2024-03-22 MED ORDER — BREZTRI AEROSPHERE 160-9-4.8 MCG/ACT IN AERO: INHALATION_SPRAY | RESPIRATORY_TRACT | 11 refills | Status: DC

## 2024-03-22 NOTE — Assessment & Plan Note (Signed)
 Quit smokng 2013 with admit for abd pain> surgery > dehisence > trach  -  Spirometry  06/22/16  FEV1 2.04 (55%)  Ratio 0.62 with min curvature   -  Chest CT w/o contrast   04/30/23: Pulmonary emphysema without acute findings. - Echo 07/12/33 with Mild MR but severe LAE, nl RA /RV - 03/22/2024  After extensive coaching inhaler device,  effectiveness =    75% > try change to breztri     Group D (now reclassified as E) in terms of symptom/risk and laba/lama/ICS  therefore appropriate rx at this point >>>  change to breztri  trial basis, no there changes needed

## 2024-03-22 NOTE — Assessment & Plan Note (Signed)
 D/c on 02 02/10/24 p R PTX requiring VATS   03/22/2024   Walked on RA  x  3   lap(s) =  approx 450  ft  @ mod pace, stopped due to end of study  with lowest 02 sats 92% and sob on last lap   Advise: 2lpm hs and daytime no need at rest but "Make sure you check your oxygen  saturation  AT  your highest level of activity (not after you stop)   to be sure it stays over 90% and adjust  02 flow upward to maintain this level if needed but remember to turn it back to previous settings when you stop (to conserve your supply)"         Each maintenance medication was reviewed in detail including emphasizing most importantly the difference between maintenance and prns and under what circumstances the prns are to be triggered using an action plan format where appropriate.  Total time for H and P, chart review, counseling, reviewing hfa / 02 /pulse ox  device(s) and generating customized AVS unique to this office visit / same day charting = 42 min extened post hosp f/u ov

## 2024-03-22 NOTE — Patient Instructions (Addendum)
 Plan A = Automatic = Always= stop symbicort  and try  Breztri  Take 2 puffs first thing in am and then another 2 puffs about 12 hours later.    Work on inhaler technique:  relax and gently blow all the way out then take a nice smooth full deep breath back in, triggering the inhaler at same time you start breathing in.  Hold breath in for at least  5 seconds if you can. Blow out breztri   thru nose. Rinse and gargle with water  when done.  If mouth or throat bother you at all,  try brushing teeth/gums/tongue with arm and hammer toothpaste/ make a slurry and gargle and spit out.      Plan B = Backup (to supplement plan A, not to replace it) Only use your albuterol  inhaler as a rescue medication to be used if you can't catch your breath by resting or doing a relaxed purse lip breathing pattern.  - The less you use it, the better it will work when you need it. - Ok to use the inhaler up to 2 puffs  every 4 hours if you must but call for appointment if use goes up over your usual need - Don't leave home without it !!  (think of it like the spare tire for your car)    Plan C = Crisis (instead of Plan B but only if Plan B stops working) - only use your albuterol  nebulizer if you first try Plan B and it fails to help > ok to use the nebulizer up to every 4 hours but if start needing it regularly call for immediate appointment  Wear 2lpm at bedtime and as needed during the day   Make sure you check your oxygen  saturation  AT  your highest level of activity (not after you stop)   to be sure it stays over 90% and adjust  02 flow upward to maintain this level if needed but remember to turn it back to previous settings when you stop (to conserve your supply).   Please schedule a follow up visit in 3 months but call sooner if needed

## 2024-04-02 ENCOUNTER — Ambulatory Visit: Payer: Medicare Other | Attending: Cardiology | Admitting: Cardiology

## 2024-04-02 ENCOUNTER — Encounter: Payer: Self-pay | Admitting: Cardiology

## 2024-04-02 VITALS — BP 136/80 | HR 84 | Ht 74.5 in | Wt 220.2 lb

## 2024-04-02 DIAGNOSIS — I471 Supraventricular tachycardia, unspecified: Secondary | ICD-10-CM | POA: Diagnosis not present

## 2024-04-02 DIAGNOSIS — I10 Essential (primary) hypertension: Secondary | ICD-10-CM

## 2024-04-02 NOTE — Patient Instructions (Signed)
Medication Instructions:  °Your physician recommends that you continue on your current medications as directed. Please refer to the Current Medication list given to you today. ° ° °Labwork: °none ° °Testing/Procedures: °none ° °Follow-Up: °6 months ° °Any Other Special Instructions Will Be Listed Below (If Applicable). ° °If you need a refill on your cardiac medications before your next appointment, please call your pharmacy. ° °

## 2024-04-02 NOTE — Progress Notes (Signed)
    Cardiology Office Note  Date: 04/02/2024   ID: Michael Ellison, Michael Ellison 1946/01/24, MRN [id number]  History of Present Illness: Michael Ellison is a 78 y.o. male last seen in November 2024 by [name], I reviewed her note.  He is here for a routine visit.  I reviewed interval history.  He was hospital in April with a spontaneous left-sided tension pneumothorax in the setting of COPD with blebs, require chest tube.  He underwent robotic assisted right sided VATS with resection of blebs and lysis of adhesions by [name].  No malignancy noted.  He recovered from this and has had subsequent follow-up with [name].  Using oxygen  more consistently now.  He reports occasional palpitations, no sudden dizziness or syncope.  We went over his medications.  He reports, lines with therapy.  I reviewed his interval ECG and lab work.  Physical Exam: VS:  BP 136/80   Pulse 84   Ht 6' 2.5 (1.892 m)   Wt 220 lb 3.2 oz (99.9 kg)   SpO2 97%   BMI 27.89 kg/m , BMI Body mass index is 27.89 kg/m.  Wt Readings from Last 3 Encounters:  04/02/24 220 lb 3.2 oz (99.9 kg)  03/22/24 220 lb (99.8 kg)  02/04/24 210 lb (95.3 kg)    General: Patient appears comfortable at rest.  Wearing oxygen  via nasal cannula. HEENT: Conjunctiva and lids normal. Neck: Supple, no elevated JVP or carotid bruits. Lungs: Decreased breath sounds without wheezing, nonlabored breathing at rest. Cardiac: Regular rate and rhythm, no S3, 2/6 systolic murmur, no pericardial rub.  ECG:  An ECG dated 02/04/2024 was personally reviewed today and demonstrated:  Sinus rhythm.  Labwork: 04/26/2023: TSH 0.712 02/04/2024: B Natriuretic Peptide 123.0 02/08/2024: ALT 12; AST 22 02/09/2024: BUN 25; Creatinine, Ser 0.95; Hemoglobin 11.7; Platelets 189; Potassium 4.4; Sodium 136   Other Studies Reviewed Today:  No interval cardiac testing for review today.  Assessment and Plan:  1.  History of palpitations with cardiac monitor  in September 2024 demonstrating occasional PACs and PVCs as well as brief episodes of PSVT.  Echocardiogram from September 2024 revealed LVEF 60 to 65% with normal RV contraction and mild mitral regurgitation.  Reports occasional palpitations, typically brief.  Plan to continue bisoprolol  5 mg daily with observation.  2.  Primary hypertension.  He is on Benicar  20 mg daily and Norvasc  2.5 mg daily.  No changes made today.  3.  Asymptomatic carotid artery atherosclerosis, minimal by carotid Dopplers in September 2024.  4.  COPD with chronic hypoxic respiratory failure, on supplemental oxygen .  Hospitalized in April with spontaneous tension pneumothorax as discussed above.  Disposition:  Follow up 6 months.  Signed, [name], M.D., F.A.C.C. [facility] at [facility]

## 2024-04-21 DIAGNOSIS — J449 Chronic obstructive pulmonary disease, unspecified: Secondary | ICD-10-CM | POA: Diagnosis not present

## 2024-04-21 DIAGNOSIS — I10 Essential (primary) hypertension: Secondary | ICD-10-CM | POA: Diagnosis not present

## 2024-05-22 DIAGNOSIS — I10 Essential (primary) hypertension: Secondary | ICD-10-CM | POA: Diagnosis not present

## 2024-05-22 DIAGNOSIS — J449 Chronic obstructive pulmonary disease, unspecified: Secondary | ICD-10-CM | POA: Diagnosis not present

## 2024-06-12 NOTE — Progress Notes (Unsigned)
 Michael Ellison, male    DOB: May 21, 1946    MRN: [id number]  Brief patient profile:  48  yobm  quit smoking 2013 p prolonged admit with abd pain > adhesions > dehiscence>   trach self referred to pulmonary clinic in [place]  07/12/2023 for doe not proportional to activity     Spirometry  06/22/16  FEV1 2.04 (55%)  Ratio 0.62 with min curvature    History of Present Illness  07/12/2023  Pulmonary/ 1st office eval/ Icis Budreau / [place] [facility] [place] [facility]  Chief Complaint  Patient presents with   COPD    Consult for COPD   Dyspnea:  walks [employer] ok but sometimes  can't get to the car in driveway  Cough: dry cough  more day more hoarse  Sleep: level bed  2 pillow with gasping for breath and sits on side of bed SABA use: does not have one  02: prn but not checking sats  Rec Stop lisinopril  and start olmesartan   20 mg one daily in its place  Work on inhaler technique:  >>>  Remember how golfers warm up by taking practice swings - do this with an empty inhaler  Symbicort  160 up to 2 puff every  12 hours - take at least 15 minutes before planned activity  Please schedule a follow up office visit in 4-6 weeks, sooner if needed  with all medications /inhalers/ solutions in hand     08/09/2023  f/u ov/[place] office/Kauan Kloosterman re: GOLD 2 copd using 02 prn/ Mitral regurg with severe LAE   maint on symbicort  160 did not  bring meds  Chief Complaint  Patient presents with   COPD    Gold 2  Dyspnea:  slower than others anywhere [facility] = can't walk a nl pace on a flat grade s sob but does fine slow and flat  Cough: none  Sleeping: flat bed 2 pillows s resp cc  SABA use: none  02: none  Rec No change in medications  My office will be contacting you by phone for referral to PFTs > not done as of 03/22/2024   Please schedule a follow up visit in 6  months but call sooner if needed please bring inhalers      03/22/2024  hosp f/u ov/ office/Jazelyn Sipe re: L PTX in GOLD 2 copd on 02 2-3lpm  maint on symbicort    160  did  bring inhalers  Chief Complaint  Patient presents with   COPD   Follow-up   Dyspnea:  very sedentary since d/c from ptx  L side/ stitches out  Cough: usual minimal  mucoid  Sleeping: bed is flat / 2 pillows    resp cc  SABA use: once a day at most hfa/ duoneb  02: prn  Rec Plan A = Automatic = Always= stop symbicort  and try  Breztri  Take 2 puffs first thing in am and then another 2 puffs about 12 hours later.   Work on inhaler technique:   Plan B = Backup (to supplement plan A, not to replace it) Only use your albuterol  inhaler as a rescue medication  Plan C = Crisis (instead of Plan B but only if Plan B stops working) - only use your albuterol  nebulizer if you first try Plan B  Wear 2lpm at bedtime and as needed during the day  Make sure you check your oxygen  saturation  AT  your highest level of activity (not after you stop)   to be sure it stays over 90%  Please schedule a follow up visit in 3 months but call sooner if needed    06/13/2024  f/u ov/Michael Ellison office/Kazimierz Springborn re: L PTX in GOLD 2 copd on 02 2-3lpm maint on breztri    Chief Complaint  Patient presents with   Shortness of Breath    Symptoms come and go But not doing much better  Dyspnea:  varies depending on whether wearing 02  Cough: none  Sleeping: bed is flat / 2 pillows    resp cc  SABA use: not much  02: 2-3 lpm  LCS  due in April 2025      No obvious day to day or daytime variability or assoc excess/ purulent sputum or mucus plugs or hemoptysis or cp or chest tightness, subjective wheeze or overt sinus or hb symptoms.    Also denies any obvious fluctuation of symptoms with weather or environmental changes or other aggravating or alleviating factors except as outlined above   No unusual exposure hx or h/o childhood pna/ asthma or knowledge of premature birth.  Current Allergies, Complete Past Medical History, Past Surgical History, Family History, and Social History were reviewed in [employer] record.  ROS  The following are not active complaints unless bolded Hoarseness, sore throat (globus)  dysphagia, dental problems, itching, sneezing,  nasal congestion or discharge of excess mucus or purulent secretions, ear ache,   fever, chills, sweats, unintended wt loss or wt gain, classically pleuritic or exertional cp,  orthopnea pnd or arm/hand swelling  or leg swelling, presyncope, palpitations, abdominal pain, anorexia, nausea, vomiting, diarrhea  or change in bowel habits or change in bladder habits, change in stools or change in urine, dysuria, hematuria,  rash, arthralgias, visual complaints, headache, numbness, weakness or ataxia or problems with walking or coordination,  change in mood or  memory.        Current Meds  Medication Sig   acetaminophen  (TYLENOL ) 500 MG tablet Take 500 mg by mouth every 6 (six) hours as needed for headache.    amLODipine  (NORVASC ) 2.5 MG tablet Take 2.5 mg by mouth daily.   bisoprolol  (ZEBETA ) 5 MG tablet Take 5 mg by mouth daily.   budesonide -glycopyrrolate -formoterol  (BREZTRI  AEROSPHERE) 160-9-4.8 MCG/ACT AERO inhaler Take 2 puffs first thing in am and then another 2 puffs about 12 hours later.   furosemide  (LASIX ) 40 MG tablet Take 40 mg by mouth daily.   gabapentin  (NEURONTIN ) 300 MG capsule Take 300 mg by mouth daily as needed (for pain).   ipratropium-albuterol  (DUONEB) 0.5-2.5 (3) MG/3ML SOLN Take 3 mLs by nebulization every 6 (six) hours as needed (for shortness of breath/wheezing).    lubiprostone  (AMITIZA ) 24 MCG capsule Take one capsule once to twice daily for constipation.   meclizine (ANTIVERT) 25 MG tablet Take 25 mg by mouth 3 (three) times daily.   meloxicam (MOBIC) 7.5 MG tablet Take 7.5 mg by mouth 2 (two) times daily.   olmesartan  (BENICAR ) 20 MG tablet Take 1 tablet (20 mg total) by mouth daily.               Past Medical History:  Diagnosis Date   Arthritis    Aspiration pneumonia (HCC) 03/01/2012   Cancer  of prostate (HCC)    COPD (chronic obstructive pulmonary disease) (HCC)    Depression    GERD (gastroesophageal reflux disease)    Hernia of abdominal cavity    Hypertension    On home O2    3L prn      Objective:  Wts  06/13/2024       228 03/22/2024         220    08/09/23 202 lb (91.6 kg)  07/12/23 195 lb (88.5 kg)  07/05/23 197 lb 6.4 oz (89.5 kg)   Vital signs reviewed  06/13/2024  - Note at rest 02 sats  98% on 2.5    General appearance:    amb somber bm nad    HEENT : Oropharynx  clear   Nasal turbinates nl    NECK :  without  apparent JVD/ palpable Nodes/TM    LUNGS: no acc muscle use,  Mild barrel  contour chest wall with bilateral  Distant bs s audible wheeze and  without cough on insp or exp maneuvers  and mild  Hyperresonant  to  percussion bilaterally     CV:  RRR  no s3 or  2/6 SEM  s  increase in P2, and no edema   ABD:  soft and nontender with pos end  insp Hoover's  in the supine position.  No bruits or organomegaly appreciated   MS:  Nl gait/ ext warm without deformities Or obvious joint restrictions  calf tenderness, cyanosis or clubbing     SKIN: warm and dry without lesions    NEURO:  alert, approp, nl sensorium with  no motor or cerebellar deficits apparent.       Assessment   Assessment & Plan Chronic respiratory failure with hypoxia (HCC) D/c on 02 02/10/24 p R PTX requiring VATS 03/22/2024   Walked on RA  x  3   lap(s) =  approx 450  ft  @ mod pace, stopped due to end of study  with lowest 02 sats 92% and sob on last lap   >>>06/13/2024   Walked on 2lpm   x  3  lap(s) =  approx 450   ft  @ mod  pace, stopped due to end of study  with lowest 02 sats 94% and  sob /dizzy at end   Advised repeatedly on need to titrate 02 to keep sats > 90% at higher levels of exertion.  see avs for instructions unique to this ov    COPD GOLD 2 Quit smokng 2013 with admit for abd pain> surgery > dehisence > trach  -  Spirometry  06/22/16  FEV1 2.04 (55%)   Ratio 0.62 with min curvature   -  Chest CT w/o contrast   04/30/23: Pulmonary emphysema without acute findings. - Echo 07/12/33 with Mild MR but severe LAE, nl RA /RV - 03/22/2024  After extensive coaching inhaler device,  effectiveness =    75% > try change to breztri   - 06/13/2024  After extensive coaching inhaler device,  effectiveness =    90% with SMI  try stiolto 2 puffs each am   Pt is Group B in terms of symptom/risk and laba/lama therefore appropriate rx at this point >>>  stiolto approp for trial with good SMI technique   with alb neb as plan B per AVS - see below   Each maintenance medication was reviewed in detail including emphasizing most importantly the difference between maintenance and prns and under what circumstances the prns are to be triggered using an action plan format where appropriate.  Total time for H and P, chart review, counseling, reviewing [facility]  device(s) , directly observing portions of ambulatory 02 saturation study/ and generating customized AVS unique to this office visit / same day charting = 31 min  AVS  Patient Instructions  Make sure you check your oxygen  saturation  AT  your highest level of activity (not after you stop)   to be sure it stays over 90% and adjust  02 flow upward to maintain this level if needed but remember to turn it back to previous settings when you stop (to conserve your supply).   Stop breztri  for now  Plan A = Automatic = Always=    Stiolto 2 puffs  1st thing in am  Plan B = Backup (to supplement plan A, not to replace it) Use your albuterol  nebulizer as a rescue medication to be used if you can't catch your breath by resting or slowing your pace  or doing a relaxed purse lip breathing pattern.  - The less you use it, the better it will work when you need it. - Ok to use it  up to every 4 hours if you must but call for appointment if use goes up over your usual need   Also  Ok to try albuterol  15 min before an  activity (on alternating days)  that you know would usually make you short of breath and see if it makes any difference and if makes none then don't take albuterol  after activity unless you can't catch your breath as this means it's the resting that helps, not the albuterol .   Please schedule a follow up visit in 3 months but call sooner if needed            Ozell America, MD 06/13/2024

## 2024-06-13 ENCOUNTER — Encounter: Payer: Self-pay | Admitting: Internal Medicine

## 2024-06-13 ENCOUNTER — Ambulatory Visit: Admitting: Internal Medicine

## 2024-06-13 VITALS — BP 132/81 | HR 77 | Ht 74.0 in | Wt 228.6 lb

## 2024-06-13 DIAGNOSIS — J9611 Chronic respiratory failure with hypoxia: Secondary | ICD-10-CM

## 2024-06-13 DIAGNOSIS — J449 Chronic obstructive pulmonary disease, unspecified: Secondary | ICD-10-CM | POA: Diagnosis not present

## 2024-06-13 MED ORDER — STIOLTO RESPIMAT 2.5-2.5 MCG/ACT IN AERS: 2.0000 | INHALATION_SPRAY | Freq: Every day | RESPIRATORY_TRACT | 11 refills | Status: AC

## 2024-06-13 MED ORDER — STIOLTO RESPIMAT 2.5-2.5 MCG/ACT IN AERS: 2.0000 | INHALATION_SPRAY | Freq: Every day | RESPIRATORY_TRACT | Status: AC

## 2024-06-13 NOTE — Patient Instructions (Signed)
 Make sure you check your oxygen  saturation  AT  your highest level of activity (not after you stop)   to be sure it stays over 90% and adjust  02 flow upward to maintain this level if needed but remember to turn it back to previous settings when you stop (to conserve your supply).   Stop breztri  for now  Plan A = Automatic = Always=    Stiolto 2 puffs  1st thing in am  Plan B = Backup (to supplement plan A, not to replace it) Use your albuterol  nebulizer as a rescue medication to be used if you can't catch your breath by resting or slowing your pace  or doing a relaxed purse lip breathing pattern.  - The less you use it, the better it will work when you need it. - Ok to use it  up to every 4 hours if you must but call for appointment if use goes up over your usual need   Also  Ok to try albuterol  15 min before an activity (on alternating days)  that you know would usually make you short of breath and see if it makes any difference and if makes none then don't take albuterol  after activity unless you can't catch your breath as this means it's the resting that helps, not the albuterol .   Please schedule a follow up visit in 3 months but call sooner if needed

## 2024-06-13 NOTE — Assessment & Plan Note (Addendum)
 Quit smokng 2013 with admit for abd pain> surgery > dehisence > trach  -  Spirometry  06/22/16  FEV1 2.04 (55%)  Ratio 0.62 with min curvature   -  Chest CT w/o contrast   04/30/23: Pulmonary emphysema without acute findings. - Echo 07/12/33 with Mild MR but severe LAE, nl RA /RV - 03/22/2024  After extensive coaching inhaler device,  effectiveness =    75% > try change to breztri   - 06/13/2024  After extensive coaching inhaler device,  effectiveness =    90% with SMI  try stiolto 2 puffs each am   Pt is Group B in terms of symptom/risk and laba/lama therefore appropriate rx at this point >>>  stiolto approp for trial with good SMI technique   with alb neb as plan B per AVS - see below   Each maintenance medication was reviewed in detail including emphasizing most importantly the difference between maintenance and prns and under what circumstances the prns are to be triggered using an action plan format where appropriate.  Total time for H and P, chart review, counseling, reviewing [facility]  device(s) , directly observing portions of ambulatory 02 saturation study/ and generating customized AVS unique to this office visit / same day charting = 31 min

## 2024-06-13 NOTE — Assessment & Plan Note (Addendum)
 D/c on 02 02/10/24 p R PTX requiring VATS 03/22/2024   Walked on RA  x  3   lap(s) =  approx 450  ft  @ mod pace, stopped due to end of study  with lowest 02 sats 92% and sob on last lap   >>>06/13/2024   Walked on 2lpm   x  3  lap(s) =  approx 450   ft  @ mod  pace, stopped due to end of study  with lowest 02 sats 94% and  sob /dizzy at end   Advised repeatedly on need to titrate 02 to keep sats > 90% at higher levels of exertion.  see avs for instructions unique to this ov

## 2024-06-22 DIAGNOSIS — I10 Essential (primary) hypertension: Secondary | ICD-10-CM | POA: Diagnosis not present

## 2024-06-22 DIAGNOSIS — J449 Chronic obstructive pulmonary disease, unspecified: Secondary | ICD-10-CM | POA: Diagnosis not present

## 2024-09-18 ENCOUNTER — Ambulatory Visit: Admitting: Internal Medicine

## 2024-10-18 ENCOUNTER — Encounter: Payer: Self-pay | Admitting: Gastroenterology

## 2024-10-30 ENCOUNTER — Ambulatory Visit: Admitting: Internal Medicine

## 2024-10-30 DIAGNOSIS — J449 Chronic obstructive pulmonary disease, unspecified: Secondary | ICD-10-CM

## 2024-10-30 DIAGNOSIS — J9611 Chronic respiratory failure with hypoxia: Secondary | ICD-10-CM

## 2024-10-30 NOTE — Progress Notes (Unsigned)
 "   QUASEAN FRYE, male    DOB: [birth year]    MRN: [id number]  Brief patient profile:  57  yobm  quit smoking 2013 p prolonged admit with abd pain > adhesions > dehiscence>   trach self referred to pulmonary clinic in [place]  07/12/2023 for doe not proportional to activity     Spirometry  06/22/16  FEV1 2.04 (55%)  Ratio 0.62 with min curvature    History of Present Illness  07/12/2023  Pulmonary/ 1st office eval/ Kolter Reaver / [place] [facility]  Chief Complaint  Patient presents with   COPD    Consult for COPD   Dyspnea:  walks [employer] ok but sometimes  can't get to the car in driveway  Cough: dry cough  more day more hoarse  Sleep: level bed  2 pillow with gasping for breath and sits on side of bed SABA use: does not have one  02: prn but not checking sats  Rec Stop lisinopril  and start olmesartan   20 mg one daily in its place  Work on inhaler technique:  >>>  Remember how golfers warm up by taking practice swings - do this with an empty inhaler  Symbicort  160 up to 2 puff every  12 hours - take at least 15 minutes before planned activity  Please schedule a follow up office visit in 4-6 weeks, sooner if needed  with all medications /inhalers/ solutions in hand     08/09/2023  f/u ov/[place] [facility]/Sharina Petre re: GOLD 2 copd using 02 prn/ Mitral regurg with severe LAE   maint on symbicort  160 did not  bring meds  Chief Complaint  Patient presents with   COPD    Gold 2  Dyspnea:  slower than others anywhere [facility] = can't walk a nl pace on a flat grade s sob but does fine slow and flat  Cough: none  Sleeping: flat bed 2 pillows s resp cc  SABA use: none  02: none  Rec No change in medications  My office will be contacting you by phone for referral to PFTs > not done as of 03/22/2024   Please schedule a follow up visit in 6  months but call sooner if needed please bring inhalers      03/22/2024  hosp f/u ov/[place] office/Nino Amano re: L PTX in GOLD 2 copd on 02 2-3lpm  maint on symbicort    160  did  bring inhalers  Chief Complaint  Patient presents with   COPD   Follow-up   Dyspnea:  very sedentary since d/c from ptx  L side/ stitches out  Cough: usual minimal  mucoid  Sleeping: bed is flat / 2 pillows    resp cc  SABA use: once a day at most hfa/ duoneb  02: prn  Rec Plan A = Automatic = Always= stop symbicort  and try  Breztri  Take 2 puffs first thing in am and then another 2 puffs about 12 hours later.   Work on inhaler technique:   Plan B = Backup (to supplement plan A, not to replace it) Only use your albuterol  inhaler as a rescue medication  Plan C = Crisis (instead of Plan B but only if Plan B stops working) - only use your albuterol  nebulizer if you first try Plan B  Wear 2lpm at bedtime and as needed during the day  Make sure you check your oxygen  saturation  AT  your highest level of activity (not after you stop)   to be sure it stays  over 90% Please schedule a follow up visit in 3 months but call sooner if needed    06/13/2024  f/u ov/[place] office/Danita Proud re: L PTX in GOLD 2 copd on 02 2-3lpm maint on breztri    Chief Complaint  Patient presents with   Shortness of Breath    Symptoms come and go But not doing much better  Dyspnea:  varies depending on whether wearing 02  Cough: none  Sleeping: bed is flat / 2 pillows    resp cc  SABA use: not much  02: 2-3 lpm  LCS  due in April 2025  Patient Instructions  Make sure you check your oxygen  saturation  AT  your highest level of activity (not after you stop)   to be sure it stays over 90%  Stop breztri  for now Plan A = Automatic = Always=    Stiolto 2 puffs  1st thing in am Plan B = Backup (to supplement plan A, not to replace it) Use your albuterol  nebulizer as a rescue medication  Also  Ok to try albuterol  15 min before an activity (on alternating days)  that you know would usually make you short of breath        10/30/2024  f/u ov/[place] office/Arwyn Besaw re: GOLD 2 copd on 02 2-3lpm  maint on ***  No  chief complaint on file.   Dyspnea:  *** Cough: *** Sleeping: ***   resp cc  SABA use: *** 02: ***  Lung cancer screening: ***   No obvious day to day or daytime variability or assoc excess/ purulent sputum or mucus plugs or hemoptysis or cp or chest tightness, subjective wheeze or overt sinus or hb symptoms.    Also denies any obvious fluctuation of symptoms with weather or environmental changes or other aggravating or alleviating factors except as outlined above   No unusual exposure hx or h/o childhood pna/ asthma or knowledge of premature birth.  Current Allergies, Complete Past Medical History, Past Surgical History, Family History, and Social History were reviewed in [employer] record.  ROS  The following are not active complaints unless bolded Hoarseness, sore throat, dysphagia, dental problems, itching, sneezing,  nasal congestion or discharge of excess mucus or purulent secretions, ear ache,   fever, chills, sweats, unintended wt loss or wt gain, classically pleuritic or exertional cp,  orthopnea pnd or arm/hand swelling  or leg swelling, presyncope, palpitations, abdominal pain, anorexia, nausea, vomiting, diarrhea  or change in bowel habits or change in bladder habits, change in stools or change in urine, dysuria, hematuria,  rash, arthralgias, visual complaints, headache, numbness, weakness or ataxia or problems with walking or coordination,  change in mood or  memory.         Outpatient Medications Prior to Visit  Medication Sig Dispense Refill   acetaminophen  (TYLENOL ) 500 MG tablet Take 500 mg by mouth every 6 (six) hours as needed for headache.      amLODipine  (NORVASC ) 2.5 MG tablet Take 2.5 mg by mouth daily.     bisoprolol  (ZEBETA ) 5 MG tablet Take 5 mg by mouth daily.     furosemide  (LASIX ) 40 MG tablet Take 40 mg by mouth daily.     gabapentin  (NEURONTIN ) 300 MG capsule Take 300 mg by mouth daily as needed (for pain).      ipratropium-albuterol  (DUONEB) 0.5-2.5 (3) MG/3ML SOLN Take 3 mLs by nebulization every 6 (six) hours as needed (for shortness of breath/wheezing).      lubiprostone  (AMITIZA ) 24  MCG capsule Take one capsule once to twice daily for constipation. 60 capsule 3   meclizine (ANTIVERT) 25 MG tablet Take 25 mg by mouth 3 (three) times daily.     meloxicam (MOBIC) 7.5 MG tablet Take 7.5 mg by mouth 2 (two) times daily.     olmesartan  (BENICAR ) 20 MG tablet Take 1 tablet (20 mg total) by mouth daily. 100 tablet 3   Tiotropium Bromide -Olodaterol (STIOLTO RESPIMAT ) 2.5-2.5 MCG/ACT AERS Inhale 2 puffs into the lungs daily. 1 each 11   Tiotropium Bromide -Olodaterol (STIOLTO RESPIMAT ) 2.5-2.5 MCG/ACT AERS Inhale 2 puffs into the lungs daily.     No facility-administered medications prior to visit.         Past Medical History:  Diagnosis Date   Arthritis    Aspiration pneumonia (HCC) 03/01/2012   Cancer of prostate (HCC)    COPD (chronic obstructive pulmonary disease) (HCC)    Depression    GERD (gastroesophageal reflux disease)    Hernia of abdominal cavity    Hypertension    On home O2    3L prn      Objective:    Wts  10/30/2024        *** 06/13/2024       228 03/22/2024         220    08/09/23 202 lb (91.6 kg)  07/12/23 195 lb (88.5 kg)  07/05/23 197 lb 6.4 oz (89.5 kg)   Vital signs reviewed  10/30/2024  - Note at rest 02 sats  ***% on ***   General appearance:    ***   Mild ba   2/6 SEM ***     Assessment            "

## 2024-10-30 NOTE — Progress Notes (Addendum)
 " [facility] - Clinic Note  11/13/2024   CHIEF COMPLAINT Patient presents for Retina Evaluation  HISTORY OF PRESENT ILLNESS: Michael Ellison is a 79 y.o. male who presents to the clinic today for:  HPI     Retina Evaluation   In right eye.  This started 3 months ago.  I, the attending physician,  performed the HPI with the patient and updated documentation appropriately.        Comments   Pt c/o a film over OD- started 3 month ago and has been getting worse. Pt went to PCP in Oct. For pink eye and pt states it only got worse from then so went to see Dr. Raechel. Pt was Rx'd Atropine  and Difluprednate  gtts OD. Pt c/o double VA. Pt denies floaters/FOL.       Last edited by Valdemar Rogue, MD on 11/13/2024  4:29 PM.    Pt states he's been having a problem w/ OD for a long while. Was referred to [facility] from a friend, seen in December. PCP had treated OD for pink eye but there was no improvement. OD has blurry haze and drainage.   Referring physician: Raechel Freund, MD [address] [place],  [place] [address]  HISTORICAL INFORMATION:  Selected notes from the MEDICAL RECORD NUMBER Referred by Dr. Raechel at [facility] for CME / uveitis OD LEE:  Ocular Hx- PMH-   CURRENT MEDICATIONS: Current Outpatient Medications (Ophthalmic Drugs)  Medication Sig   atropine  1 % ophthalmic solution Place 1 drop into the right eye in the morning and at bedtime.   Difluprednate  0.05 % EMUL Apply 1 drop to eye in the morning, at noon, in the evening, and at bedtime.   No current facility-administered medications for this visit. (Ophthalmic Drugs)   Current Outpatient Medications (Other)  Medication Sig   acetaminophen  (TYLENOL ) 500 MG tablet Take 500 mg by mouth every 6 (six) hours as needed for headache.    amLODipine  (NORVASC ) 2.5 MG tablet Take 2.5 mg by mouth daily.   bisoprolol  (ZEBETA ) 5 MG tablet Take 5 mg by mouth daily.   furosemide  (LASIX ) 40  MG tablet Take 40 mg by mouth daily.   gabapentin  (NEURONTIN ) 300 MG capsule Take 300 mg by mouth daily as needed (for pain).   ipratropium-albuterol  (DUONEB) 0.5-2.5 (3) MG/3ML SOLN Take 3 mLs by nebulization every 6 (six) hours as needed (for shortness of breath/wheezing).    lubiprostone  (AMITIZA ) 24 MCG capsule Take one capsule once to twice daily for constipation.   meclizine (ANTIVERT) 25 MG tablet Take 25 mg by mouth 3 (three) times daily.   meloxicam (MOBIC) 7.5 MG tablet Take 7.5 mg by mouth 2 (two) times daily.   olmesartan  (BENICAR ) 20 MG tablet Take 1 tablet (20 mg total) by mouth daily.   Tiotropium Bromide -Olodaterol (STIOLTO RESPIMAT ) 2.5-2.5 MCG/ACT AERS Inhale 2 puffs into the lungs daily.   Tiotropium Bromide -Olodaterol (STIOLTO RESPIMAT ) 2.5-2.5 MCG/ACT AERS Inhale 2 puffs into the lungs daily.   No current facility-administered medications for this visit. (Other)   REVIEW OF SYSTEMS:  ALLERGIES Allergies[1] PAST MEDICAL HISTORY Past Medical History:  Diagnosis Date   Arthritis    Aspiration pneumonia (HCC) 03/01/2012   Cancer of prostate (HCC)    COPD (chronic obstructive pulmonary disease) (HCC)    Depression    Dyspnea    GERD (gastroesophageal reflux disease)    Heart murmur    Hernia of abdominal cavity  Hypertension    Leaky heart valve    On home O2 PRN-does not use all the time.    3L prn   Past Surgical History:  Procedure Laterality Date   BIOPSY  08/17/2023   Procedure: BIOPSY;  Surgeon: Shaaron Lamar HERO, MD;  [facility];  Service: Endoscopy;;   BOWEL RESECTION  02/25/2012   Procedure: SMALL BOWEL RESECTION;  Surgeon: Thresa JAYSON Pulling, MD;  Location: [facility];  Service: General;;   COLONOSCOPY N/A 05/27/2016   Dr.Rourk: 14 mm polyp in the sigmoid colon removed, tubular adenoma.  8 mm polyp in the descending colon removed, tubular adenoma.  Diverticulosis.  Recommend colonoscopy 3 years   COLONOSCOPY WITH PROPOFOL  N/A 09/27/2019    Procedure: COLONOSCOPY WITH PROPOFOL ;  Surgeon: Shaaron Lamar HERO, MD;  [facility]: [facility];  Service: Endoscopy;  Laterality: N/A;  12:30pm   COLONOSCOPY WITH PROPOFOL  N/A 08/17/2023   Procedure: COLONOSCOPY WITH PROPOFOL ;  Surgeon: Shaaron Lamar HERO, MD;  [facility];  Service: Endoscopy;  Laterality: N/A;  2:15 pm, asa 3   HERNIA REPAIR     INCISION AND DRAINAGE ABSCESS N/A 01/15/2013   Procedure: INCISION AND DRAINAGE Abdominal Wall ABSCESS;  Surgeon: Lynwood MALVA Pina, MD;  Location: [facility] OR;  Service: General;  Laterality: N/A;   INGUINAL HERNIA REPAIR Right 09/16/2017   Procedure: RIGHT INGUINAL HERNIORRHAPHY  WITH MESH;  Surgeon: Mavis Anes, MD;  Location: [facility];  Service: General;  Laterality: Right;   INSERTION OF MESH N/A 12/25/2012   Procedure: INSERTION OF MESH;  Surgeon: Lynwood MALVA Pina, MD;  Location: [facility];  Service: General;  Laterality: N/A;   INSERTION OF MESH N/A 08/26/2016   Procedure: INSERTION OF MESH;  Surgeon: Lynwood Pina, MD;  Location: [facility] OR;  Service: General;  Laterality: N/A;   LAPAROTOMY  02/25/2012   Procedure: EXPLORATORY LAPAROTOMY;  Surgeon: Thresa JAYSON Pulling, MD;  Location: [facility];  Service: General;  Laterality: N/A;   POLYPECTOMY  05/27/2016   Procedure: POLYPECTOMY;  Surgeon: Lamar HERO Shaaron, MD;  [facility];  Service: Endoscopy;;  Descending colon polyp and Sigmoid colon polyp removed via hot snare   POLYPECTOMY  09/27/2019   Procedure: POLYPECTOMY;  Surgeon: Shaaron Lamar HERO, MD;  [facility];  Service: Endoscopy;;   PROSTATE BIOPSY     robotic prostate surgery  2011   TRACHEOSTOMY  03/2012   VENTRAL HERNIA REPAIR  12/25/2012    Dr Pina   VENTRAL HERNIA REPAIR N/A 12/25/2012   Procedure: HERNIA REPAIR VENTRAL ADULT;  Surgeon: Lynwood MALVA Pina, MD;  Location: [facility] OR;  Service: General;  Laterality: N/A;   VENTRAL HERNIA REPAIR N/A 08/26/2016   Procedure: VENTRAL INCISIONAL HERNIA REPAIR WITH MESH;  Surgeon: Lynwood Pina, MD;  Location:  [facility] OR;  Service: General;  Laterality: N/A;   WEDGE RESECTION, LUNG, ROBOT-ASSISTED, THORACOSCOPIC Left 02/06/2024   Procedure: WEDGE RESECTION, LUNG, ROBOT-ASSISTED, THORACOSCOPIC, BLEB RESECTION;  Surgeon: Kerrin Elspeth JAYSON, MD;  Location: [facility];  Service: Thoracic;  Laterality: Left;  LEFT VIDEO ASSISTED THORACOSCOPY(VATS), BLEB RESECTION, PLEURAL ABRASION   WOUND DEBRIDEMENT  03/02/2012   Procedure: DEBRIDEMENT CLOSURE/ABDOMINAL WOUND;  Surgeon: Lynwood MALVA Pina, MD;  Location: [facility];  Service: General;  Laterality: N/A;   ZENKER'S DIVERTICULECTOMY  2013   Dr. Carlie   FAMILY HISTORY Family History  Problem Relation Age of Onset   Hypertension Mother    Pseudochol deficiency Neg Hx    Anesthesia problems Neg Hx  Hypotension Neg Hx    Malignant hyperthermia Neg Hx    Colon cancer Neg Hx    SOCIAL HISTORY Social History[2]     OPHTHALMIC EXAM:  Base Eye Exam     Visual Acuity (Snellen - Linear)       Right Left   Dist [place] 20/100 -2 20/60 -2   Dist ph [place] NI NI         Tonometry (Tonopen, 1:44 PM)       Right Left   Pressure 18 18         Pupils       Pupils Dark Light Shape React APD   Right PERRL 3 2 Round Brisk None   Left PERRL 3 2 Round Brisk None         Visual Fields       Left Right    Full Full         Extraocular Movement       Right Left    Full, Ortho Full, Ortho         Neuro/Psych     Oriented x3: Yes         Dilation     Both eyes: 1.0% Mydriacyl, 2.5% Phenylephrine  @ 1:46 PM           Slit Lamp and Fundus Exam     Slit Lamp Exam       Right Left   Lids/Lashes Dermatochalasis, mild MGD Dermatochalasis   Conjunctiva/Sclera Melanosis, temporal pinguecula mild melanosis, nasal and temporal pinguecula   Cornea Arcus, fine endo pigment Arcus, tear film debris   Anterior Chamber Mod depth, narrow angles, 1-2+cell Deep, 0.5+fine cell   Iris poorly dilated, almost 360 PS Round and dilated   Lens 2-3+ Nuclear  sclerosis, 2-3+ Cortical cataract 2-3+ Nuclear sclerosis, 2-3+ Cortical cataract   Anterior Vitreous Vitreous syneresis; ?vit haze Vitreous syneresis         Fundus Exam       Right Left   Disc mild pallor, sharp rim mild pallor, sharp rim   C/D Ratio 0.5 0.5   Macula Hazy view, grossly flat, no detail visible Flat, good foveal reflex, RPE mottling, mild ERM   Vessels Hazy view, mild attenuation, mild tortuosity Attenuated, mild tortuosity   Periphery Hazy view, grossly attached, no heme Attached, no heme           Refraction     Manifest Refraction (Auto)       Sphere Cylinder Axis Dist VA   Right +1.00 +1.50 028 20/100   Left +0.25 +0.50 154 20/40           IMAGING AND PROCEDURES  Imaging and Procedures for 11/13/2024  OCT, Retina - OU - Both Eyes       Right Eye Quality was good. Central Foveal Thickness: 378. Progression has no prior data. Findings include no SRF, abnormal foveal contour, myopic contour, epiretinal membrane, intraretinal fluid, lamellar hole (ERM w/ central lamellar hole and +cystic changes temporal macula).   Left Eye Quality was good. Central Foveal Thickness: 314. Progression has no prior data. Findings include no IRF, no SRF, abnormal foveal contour, myopic contour, lamellar hole (ERM w/ irregular lamellar hole, partial PVD).   Notes *Images captured and stored on drive  Diagnosis / Impression:  OD: ERM w/ central lamellar hole and +cystic changes temporal macula OS: ERM w/ irregular lamellar hole, partial PVD  Clinical management:  See below  Abbreviations: NFP - Normal foveal profile. CME -  cystoid macular edema. PED - pigment epithelial detachment. IRF - intraretinal fluid. SRF - subretinal fluid. EZ - ellipsoid zone. ERM - epiretinal membrane. ORA - outer retinal atrophy. ORT - outer retinal tubulation. SRHM - subretinal hyper-reflective material. IRHM - intraretinal hyper-reflective material      Fluorescein Angiography Optos  (Transit OD)       Right Eye Progression has no prior data. Early phase findings include delayed filling, staining (?patchy hyper fluorescence of venules ). Mid/Late phase findings include leakage, staining (Mild hyper fluorescence of disc, mild perifoveal petaloid hyper fluorescent leakage ).   Left Eye Progression has no prior data. Early phase findings include staining. Mid/Late phase findings include staining (No leakage).   Notes **Images stored on drive**  Impression: OD: Mild late hyper fluorescence of disc; mild late perifoveal petaloid hyper fluorescent leakage--chorioretinal inflammation, CME   OS: No leakage or CME     Injection into Tenon's Capsule - OD - Right Eye       Time Out 11/13/2024. 3:22 PM. Confirmed correct patient, procedure, site, and patient consented.   Anesthesia Topical anesthesia was used. Anesthetic medications included Lidocaine  2%, Proparacaine 0.5%.   Procedure Preparation included 5% betadine  to ocular surface, eyelid speculum. A (25g) needle was used.   Injection: 40 mg triamcinolone  acetonide 40 MG/ML   Route: Other, Site: Right Eye   NDC: [id number], Lot: [id number], Expiration date: 04/16/2026   Post-op Post injection exam found visual acuity of at least counting fingers. The patient tolerated the procedure well. There were no complications. The patient received written and verbal post procedure care education. Post injection medications were not given.   Notes 1.0 cc of Kenalog -40 (40 mg) injected into subtenon's capsule in the superotemporal quadrant. Betadine  was applied to Injection area pre and post-injection then rinsed with sterile BSS. 1 drop of ofloxacin was instilled into the eye. There were no complications. Pt tolerated procedure well.          ASSESSMENT/PLAN:   ICD-10-CM   1. Chorioretinal inflammation of right eye  H30.91 Fluorescein Angiography Optos (Transit OD)    2. Cystoid macular edema of right eye  H35.351  OCT, Retina - OU - Both Eyes    Fluorescein Angiography Optos (Transit OD)    Injection into Tenon's Capsule - OD - Right Eye    triamcinolone  acetonide (KENALOG -40) injection 40 mg    3. Epiretinal membrane (ERM) of both eyes  H35.373 OCT, Retina - OU - Both Eyes    4. Lamellar macular hole of both eyes  H35.343     5. Essential hypertension  I10     6. Hypertensive retinopathy of both eyes  H35.033 Fluorescein Angiography Optos (Transit OD)    7. Combined forms of age-related cataract of both eyes  H25.813      1,2. Chorioretinal Inflammation/Uveitis and CME OD - Pt 2-3 mo history of VA changes in OD, started w/ seeing PCP and getting treatment for pink eye, no improvement. Reports gradual VA changes, hazy vision in OD, +light sensitivity - Pt diagnosed w/ Ulcerative Colitis last year, hx of HTN and COPD.  - saw Dr. Raechel in mid December who noted uveitis and CME OD -- started Durezol  and Atropiine BID OD - BCVA OD 20/100 - exam OD shows +AC cell, almost 360 posterior synechiae, +vit haze - FA on 01.28.26 shows OD: Mild hyper fluorescence of disc, mild perifoveal petaloid hyper fluorescent leakage--chorioretinal inflammation, CME; OS: No leakage - OCT shows OD: ERM w/ central lamellar hole  and +cystic changes temporal macula; OS: ERM w/ irregular lamellar hole, partial PVD - recommend increasing difluprednate  to QID OD, cont Atropine  BID OD--rx w/ refills sent today 01.28.26 - recommend STK OD #1 today, 01.28.26 w/ f/u in 3 weeks - pt wishes to proceed w/ inj - RBA of procedure discussed, questions answered - STK informed consent obtained and signed, 01.28.26 - see procedure note  - f/u 3 weeks, DFE, OCT  3,4. ERM w/ lamellar hole OU - The natural history, anatomy, potential for loss of vision, and treatment options including vitrectomy techniques and the complications of endophthalmitis, retinal detachment, vitreous hemorrhage, cataract progression and permanent vision loss  discussed with the patient. - mild ERM with lamellar hole OU - no indication for surgery at this time - monitor for now  5,6. Hypertensive retinopathy OU - discussed importance of tight BP control - monitor   7. Mixed Cataract OU - The symptoms of cataract, surgical options, and treatments and risks were discussed with patient. - discussed diagnosis and progression - monitor   Ophthalmic Meds Ordered this visit:  Meds ordered this encounter  Medications   Difluprednate  0.05 % EMUL    Sig: Apply 1 drop to eye in the morning, at noon, in the evening, and at bedtime.    Dispense:  5 mL    Refill:  3   atropine  1 % ophthalmic solution    Sig: Place 1 drop into the right eye in the morning and at bedtime.    Dispense:  2 mL    Refill:  3   triamcinolone  acetonide (KENALOG -40) injection 40 mg     Return in about 3 weeks (around 12/04/2024) for CME OD, DFE, OCT.  There are no Patient Instructions on file for this visit.  Explained the diagnoses, plan, and follow up with the patient and they expressed understanding.  Patient expressed understanding of the importance of proper follow up care.   This document serves as a record of services personally performed by Redell JUDITHANN Hans, MD, PhD. It was created on their behalf by Wanda GEANNIE Keens, COT an ophthalmic technician. The creation of this record is the provider's dictation and/or activities during the visit.    Electronically signed by:  Wanda GEANNIE Keens, COT  11/13/24 4:43 PM   This document serves as a record of services personally performed by Redell JUDITHANN Hans, MD, PhD. It was created on their behalf by Almetta Pesa, an ophthalmic technician. The creation of this record is the provider's dictation and/or activities during the visit.    Electronically signed by: Almetta Pesa, OA, 11/13/24  4:43 PM  Redell JUDITHANN Hans, M.D., Ph.D. Diseases & Surgery of the Retina and Vitreous [facility] 11/13/2024  I have reviewed the above documentation for accuracy and completeness, and I agree with the above. Redell JUDITHANN Hans, M.D., Ph.D. 11/13/24 4:44 PM   Abbreviations: M myopia (nearsighted); A astigmatism; H hyperopia (farsighted); P presbyopia; Mrx spectacle prescription;  CTL contact lenses; OD right eye; OS left eye; OU both eyes  XT exotropia; ET esotropia; PEK punctate epithelial keratitis; PEE punctate epithelial erosions; DES dry eye syndrome; MGD meibomian gland dysfunction; ATs artificial tears; PFAT's preservative free artificial tears; NSC nuclear sclerotic cataract; PSC posterior subcapsular cataract; ERM epi-retinal membrane; PVD posterior vitreous detachment; RD retinal detachment; DM diabetes mellitus; DR diabetic retinopathy; NPDR non-proliferative diabetic retinopathy; PDR proliferative diabetic retinopathy; CSME clinically significant macular edema; DME diabetic macular edema; dbh dot blot hemorrhages; CWS cotton  wool spot; POAG primary open angle glaucoma; C/D cup-to-disc ratio; HVF humphrey visual field; GVF goldmann visual field; OCT optical coherence tomography; IOP intraocular pressure; BRVO Branch retinal vein occlusion; CRVO central retinal vein occlusion; CRAO central retinal artery occlusion; BRAO branch retinal artery occlusion; RT retinal tear; SB scleral buckle; PPV pars plana vitrectomy; VH Vitreous hemorrhage; PRP panretinal laser photocoagulation; IVK intravitreal kenalog ; VMT vitreomacular traction; MH Macular hole;  NVD neovascularization of the disc; NVE neovascularization elsewhere; AREDS age related eye disease study; ARMD age related macular degeneration; POAG primary open angle glaucoma; EBMD epithelial/anterior basement membrane dystrophy; ACIOL anterior chamber intraocular lens; IOL intraocular lens; PCIOL posterior chamber intraocular lens; Phaco/IOL phacoemulsification with intraocular lens placement; PRK photorefractive keratectomy; LASIK laser assisted  in situ keratomileusis; HTN hypertension; DM diabetes mellitus; COPD chronic obstructive pulmonary disease     [1]  Allergies Allergen Reactions   Lorazepam  Other (See Comments)    SEVERE DELERIUM AGITATION Tolerates midazolam  and other benzo's  [2]  Social History Tobacco Use   Smoking status: Former    Current packs/day: 0.00    Average packs/day: 1.5 packs/day for 44.0 years (66.0 ttl pk-yrs)    Types: Cigarettes    Start date: 02/22/1968    Quit date: 02/22/2012    Years since quitting: 12.7   Smokeless tobacco: Never   Tobacco comments:    Quit x 4 years  Vaping Use   Vaping status: Never Used  Substance Use Topics   Alcohol use: No    Alcohol/week: 0.0 standard drinks of alcohol    Comment: sober since ~ 02/2012   Drug use: No   "

## 2024-11-06 ENCOUNTER — Ambulatory Visit: Admitting: Gastroenterology

## 2024-11-13 ENCOUNTER — Encounter (INDEPENDENT_AMBULATORY_CARE_PROVIDER_SITE_OTHER): Payer: Self-pay | Admitting: Ophthalmology

## 2024-11-13 ENCOUNTER — Ambulatory Visit (INDEPENDENT_AMBULATORY_CARE_PROVIDER_SITE_OTHER): Admitting: Ophthalmology

## 2024-11-13 VITALS — BP 155/95

## 2024-11-13 DIAGNOSIS — H3581 Retinal edema: Secondary | ICD-10-CM

## 2024-11-13 DIAGNOSIS — H35373 Puckering of macula, bilateral: Secondary | ICD-10-CM

## 2024-11-13 DIAGNOSIS — H35343 Macular cyst, hole, or pseudohole, bilateral: Secondary | ICD-10-CM | POA: Diagnosis not present

## 2024-11-13 DIAGNOSIS — I10 Essential (primary) hypertension: Secondary | ICD-10-CM | POA: Diagnosis not present

## 2024-11-13 DIAGNOSIS — H25813 Combined forms of age-related cataract, bilateral: Secondary | ICD-10-CM | POA: Diagnosis not present

## 2024-11-13 DIAGNOSIS — H35033 Hypertensive retinopathy, bilateral: Secondary | ICD-10-CM

## 2024-11-13 DIAGNOSIS — H35351 Cystoid macular degeneration, right eye: Secondary | ICD-10-CM

## 2024-11-13 DIAGNOSIS — H3091 Unspecified chorioretinal inflammation, right eye: Secondary | ICD-10-CM | POA: Diagnosis not present

## 2024-11-13 MED ORDER — ATROPINE SULFATE 1 % OP SOLN: 1.0000 [drp] | Freq: Two times a day (BID) | OPHTHALMIC | 3 refills | Status: AC

## 2024-11-13 MED ORDER — DIFLUPREDNATE 0.05 % OP EMUL: 1.0000 [drp] | Freq: Four times a day (QID) | OPHTHALMIC | 3 refills | Status: AC

## 2024-11-13 MED ORDER — TRIAMCINOLONE ACETONIDE 40 MG/ML IJ SUSP FOR KALEIDOSCOPE
40.0000 mg | INTRAMUSCULAR | Status: AC | PRN
  Administered 2024-11-13: 40 mg

## 2024-11-14 ENCOUNTER — Other Ambulatory Visit (INDEPENDENT_AMBULATORY_CARE_PROVIDER_SITE_OTHER): Payer: Self-pay

## 2024-11-14 MED ORDER — PREDNISOLONE ACETATE 1 % OP SUSP: 1.0000 [drp] | OPHTHALMIC | 3 refills | Status: AC

## 2024-11-21 ENCOUNTER — Ambulatory Visit: Admitting: Internal Medicine

## 2024-11-21 DIAGNOSIS — J9611 Chronic respiratory failure with hypoxia: Secondary | ICD-10-CM

## 2024-11-21 DIAGNOSIS — J449 Chronic obstructive pulmonary disease, unspecified: Secondary | ICD-10-CM

## 2024-11-21 NOTE — Progress Notes (Unsigned)
 "   Michael Ellison, male    DOB: 1946/06/28    MRN: [id number]  Brief patient profile:  6  yobm  quit smoking 2013 p prolonged admit with abd pain > adhesions > dehiscence>   trach self referred to pulmonary clinic in [place]  07/12/2023 for doe not proportional to activity     Spirometry  06/22/16  FEV1 2.04 (55%)  Ratio 0.62 with min curvature    History of Present Illness  07/12/2023  Pulmonary/ 1st office eval/ Gor Vestal / [place] [facility]  Chief Complaint  Patient presents with   COPD    Consult for COPD   Dyspnea:  walks [employer] ok but sometimes  can't get to the car in driveway  Cough: dry cough  more day more hoarse  Sleep: level bed  2 pillow with gasping for breath and sits on side of bed SABA use: does not have one  02: prn but not checking sats  Rec Stop lisinopril  and start olmesartan   20 mg one daily in its place  Work on inhaler technique:  >>>  Remember how golfers warm up by taking practice swings - do this with an empty inhaler  Symbicort  160 up to 2 puff every  12 hours - take at least 15 minutes before planned activity  Please schedule a follow up office visit in 4-6 weeks, sooner if needed  with all medications /inhalers/ solutions in hand     08/09/2023  f/u ov/[place] office/Sheilyn Boehlke re: GOLD 2 copd using 02 prn/ Mitral regurg with severe LAE   maint on symbicort  160 did not  bring meds  Chief Complaint  Patient presents with   COPD    Gold 2  Dyspnea:  slower than others anywhere [facility] = can't walk a nl pace on a flat grade s sob but does fine slow and flat  Cough: none  Sleeping: flat bed 2 pillows s resp cc  SABA use: none  02: none  Rec No change in medications  My office will be contacting you by phone for referral to PFTs > not done as of 03/22/2024   Please schedule a follow up visit in 6  months but call sooner if needed please bring inhalers      03/22/2024  hosp f/u ov/ office/Lynnette Pote re: L PTX in GOLD 2 copd on 02 2-3lpm  maint on symbicort    160  did  bring inhalers  Chief Complaint  Patient presents with   COPD   Follow-up   Dyspnea:  very sedentary since d/c from ptx  L side/ stitches out  Cough: usual minimal  mucoid  Sleeping: bed is flat / 2 pillows    resp cc  SABA use: once a day at most hfa/ duoneb  02: prn  Rec Plan A = Automatic = Always= stop symbicort  and try  Breztri  Take 2 puffs first thing in am and then another 2 puffs about 12 hours later.   Work on inhaler technique:   Plan B = Backup (to supplement plan A, not to replace it) Only use your albuterol  inhaler as a rescue medication  Plan C = Crisis (instead of Plan B but only if Plan B stops working) - only use your albuterol  nebulizer if you first try Plan B  Wear 2lpm at bedtime and as needed during the day  Make sure you check your oxygen  saturation  AT  your highest level of activity (not after you stop)   to be sure it stays  over 90% Please schedule a follow up visit in 3 months but call sooner if needed    06/13/2024  f/u ov/ office/Kairee Kozma re: L PTX in GOLD 2 copd on 02 2-3lpm maint on breztri    Chief Complaint  Patient presents with   Shortness of Breath    Symptoms come and go But not doing much better  Dyspnea:  varies depending on whether wearing 02  Cough: none  Sleeping: bed is flat / 2 pillows    resp cc  SABA use: not much  02: 2-3 lpm  LCS  due in April 2025  Patient Instructions  Make sure you check your oxygen  saturation  AT  your highest level of activity (not after you stop)   to be sure it stays over 90%  Stop breztri  for now Plan A = Automatic = Always=    Stiolto 2 puffs  1st thing in am Plan B = Backup (to supplement plan A, not to replace it) Use your albuterol  nebulizer as a rescue medication  Also  Ok to try albuterol  15 min before an activity (on alternating days)  that you know would usually make you short of breath        11/21/2024  f/u ov/ office/Nohemy Koop re: GOLD 2 copd on 02 2-3lpm s/p L PTX  maint on  ***  No chief complaint on file.   Dyspnea:  *** Cough: *** Sleeping: ***   resp cc  SABA use: *** 02: ***  Lung cancer screening: ***   No obvious day to day or daytime variability or assoc excess/ purulent sputum or mucus plugs or hemoptysis or cp or chest tightness, subjective wheeze or overt sinus or hb symptoms.    Also denies any obvious fluctuation of symptoms with weather or environmental changes or other aggravating or alleviating factors except as outlined above   No unusual exposure hx or h/o childhood pna/ asthma or knowledge of premature birth.  Current Allergies, Complete Past Medical History, Past Surgical History, Family History, and Social History were reviewed in [employer] record.  ROS  The following are not active complaints unless bolded Hoarseness, sore throat, dysphagia, dental problems, itching, sneezing,  nasal congestion or discharge of excess mucus or purulent secretions, ear ache,   fever, chills, sweats, unintended wt loss or wt gain, classically pleuritic or exertional cp,  orthopnea pnd or arm/hand swelling  or leg swelling, presyncope, palpitations, abdominal pain, anorexia, nausea, vomiting, diarrhea  or change in bowel habits or change in bladder habits, change in stools or change in urine, dysuria, hematuria,  rash, arthralgias, visual complaints, headache, numbness, weakness or ataxia or problems with walking or coordination,  change in mood or  memory.         Outpatient Medications Prior to Visit  Medication Sig Dispense Refill   acetaminophen  (TYLENOL ) 500 MG tablet Take 500 mg by mouth every 6 (six) hours as needed for headache.      amLODipine  (NORVASC ) 2.5 MG tablet Take 2.5 mg by mouth daily.     atropine  1 % ophthalmic solution Place 1 drop into the right eye in the morning and at bedtime. 2 mL 3   bisoprolol  (ZEBETA ) 5 MG tablet Take 5 mg by mouth daily.     Difluprednate  0.05 % EMUL Apply 1 drop to eye in the  morning, at noon, in the evening, and at bedtime. 5 mL 3   furosemide  (LASIX ) 40 MG tablet Take 40 mg by mouth daily.  gabapentin  (NEURONTIN ) 300 MG capsule Take 300 mg by mouth daily as needed (for pain).     ipratropium-albuterol  (DUONEB) 0.5-2.5 (3) MG/3ML SOLN Take 3 mLs by nebulization every 6 (six) hours as needed (for shortness of breath/wheezing).      lubiprostone  (AMITIZA ) 24 MCG capsule Take one capsule once to twice daily for constipation. 60 capsule 3   meclizine (ANTIVERT) 25 MG tablet Take 25 mg by mouth 3 (three) times daily.     meloxicam (MOBIC) 7.5 MG tablet Take 7.5 mg by mouth 2 (two) times daily.     olmesartan  (BENICAR ) 20 MG tablet Take 1 tablet (20 mg total) by mouth daily. 100 tablet 3   prednisoLONE  acetate (PRED FORTE ) 1 % ophthalmic suspension Place 1 drop into the right eye every 2 (two) hours while awake. 10 mL 3   Tiotropium Bromide -Olodaterol (STIOLTO RESPIMAT ) 2.5-2.5 MCG/ACT AERS Inhale 2 puffs into the lungs daily. 1 each 11   Tiotropium Bromide -Olodaterol (STIOLTO RESPIMAT ) 2.5-2.5 MCG/ACT AERS Inhale 2 puffs into the lungs daily.     No facility-administered medications prior to visit.         Past Medical History:  Diagnosis Date   Arthritis    Aspiration pneumonia (HCC) 03/01/2012   Cancer of prostate (HCC)    COPD (chronic obstructive pulmonary disease) (HCC)    Depression    GERD (gastroesophageal reflux disease)    Hernia of abdominal cavity    Hypertension    On home O2    3L prn      Objective:    Wts  11/21/2024        *** 06/13/2024       228 03/22/2024         220    08/09/23 202 lb (91.6 kg)  07/12/23 195 lb (88.5 kg)  07/05/23 197 lb 6.4 oz (89.5 kg)   Vital signs reviewed  11/21/2024  - Note at rest 02 sats  ***% on ***   General appearance:    ***   Mild ba   2/6 SEM ***     Assessment            "

## 2024-12-03 ENCOUNTER — Ambulatory Visit: Admitting: Internal Medicine

## 2024-12-04 ENCOUNTER — Encounter (INDEPENDENT_AMBULATORY_CARE_PROVIDER_SITE_OTHER): Admitting: Ophthalmology

## 2024-12-04 DIAGNOSIS — H3091 Unspecified chorioretinal inflammation, right eye: Secondary | ICD-10-CM

## 2024-12-04 DIAGNOSIS — H35343 Macular cyst, hole, or pseudohole, bilateral: Secondary | ICD-10-CM

## 2024-12-04 DIAGNOSIS — H25813 Combined forms of age-related cataract, bilateral: Secondary | ICD-10-CM

## 2024-12-04 DIAGNOSIS — H35033 Hypertensive retinopathy, bilateral: Secondary | ICD-10-CM

## 2024-12-04 DIAGNOSIS — I10 Essential (primary) hypertension: Secondary | ICD-10-CM

## 2024-12-04 DIAGNOSIS — H35373 Puckering of macula, bilateral: Secondary | ICD-10-CM

## 2024-12-04 DIAGNOSIS — H35351 Cystoid macular degeneration, right eye: Secondary | ICD-10-CM
